# Patient Record
Sex: Male | Born: 1995
Health system: Southern US, Community
[De-identification: ages and names within clinical notes are randomized; demographics above are authoritative.]

## PROBLEM LIST (undated history)

## (undated) DIAGNOSIS — R45851 Suicidal ideations: Secondary | ICD-10-CM

## (undated) DIAGNOSIS — F29 Unspecified psychosis not due to a substance or known physiological condition: Secondary | ICD-10-CM

## (undated) DIAGNOSIS — R569 Unspecified convulsions: Secondary | ICD-10-CM

## (undated) DIAGNOSIS — F909 Attention-deficit hyperactivity disorder, unspecified type: Secondary | ICD-10-CM

## (undated) DIAGNOSIS — F259 Schizoaffective disorder, unspecified: Secondary | ICD-10-CM

## (undated) DIAGNOSIS — F988 Other specified behavioral and emotional disorders with onset usually occurring in childhood and adolescence: Secondary | ICD-10-CM

## (undated) DIAGNOSIS — F1910 Other psychoactive substance abuse, uncomplicated: Secondary | ICD-10-CM

## (undated) DIAGNOSIS — R519 Headache, unspecified: Secondary | ICD-10-CM

## (undated) DIAGNOSIS — F411 Generalized anxiety disorder: Secondary | ICD-10-CM

## (undated) DIAGNOSIS — C711 Malignant neoplasm of frontal lobe: Secondary | ICD-10-CM

---
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
## 2014-05-07 ENCOUNTER — Emergency Department (HOSPITAL_COMMUNITY)
Admission: EM | Admit: 2014-05-07 | Discharge: 2014-05-07 | Disposition: A | Discharge status: Home / Self Care | Payer: Medicaid Other | Attending: Emergency Medicine | Admitting: Emergency Medicine

## 2014-05-07 ENCOUNTER — Encounter (HOSPITAL_COMMUNITY): Payer: Self-pay | Admitting: Emergency Medicine

## 2014-05-07 DIAGNOSIS — R05 Cough: Secondary | ICD-10-CM | POA: Insufficient documentation

## 2014-05-07 DIAGNOSIS — N508 Other specified disorders of male genital organs: Secondary | ICD-10-CM | POA: Insufficient documentation

## 2014-05-07 DIAGNOSIS — M545 Low back pain, unspecified: Secondary | ICD-10-CM

## 2014-05-07 DIAGNOSIS — M62830 Muscle spasm of back: Secondary | ICD-10-CM

## 2014-05-07 DIAGNOSIS — Z720 Tobacco use: Secondary | ICD-10-CM | POA: Insufficient documentation

## 2014-05-07 LAB — URINALYSIS, ROUTINE W REFLEX MICROSCOPIC
Bilirubin Urine: NEGATIVE
Glucose, UA: NEGATIVE mg/dL
Hgb urine dipstick: NEGATIVE
Ketones, ur: NEGATIVE mg/dL
Leukocytes, UA: NEGATIVE
Nitrite: NEGATIVE
Protein, ur: NEGATIVE mg/dL
Specific Gravity, Urine: 1.018 (ref 1.005–1.030)
Urobilinogen, UA: 1 mg/dL (ref 0.0–1.0)
pH: 6.5 (ref 5.0–8.0)

## 2014-05-07 MED ORDER — HYDROCODONE-ACETAMINOPHEN 5-325 MG PO TABS: 1.0000 | ORAL_TABLET | Freq: Four times a day (QID) | ORAL | Status: DC | PRN

## 2014-05-07 MED ORDER — KETOROLAC TROMETHAMINE 30 MG/ML IJ SOLN
30.0000 mg | Freq: Once | INTRAMUSCULAR | Status: AC
  Administered 2014-05-07: 30 mg via INTRAMUSCULAR
  Filled 2014-05-07: qty 1

## 2014-05-07 MED ORDER — NAPROXEN 500 MG PO TABS: 500.0000 mg | ORAL_TABLET | Freq: Two times a day (BID) | ORAL | Status: DC | PRN

## 2014-05-07 MED ORDER — CYCLOBENZAPRINE HCL 10 MG PO TABS: 10.0000 mg | ORAL_TABLET | Freq: Three times a day (TID) | ORAL | Status: DC | PRN

## 2014-05-07 NOTE — ED Provider Notes (Signed)
CSN: [id number]     Arrival date & time 05/07/14  1057 History   First MD Initiated Contact with Patient 05/07/14 1237     Chief Complaint  Patient presents with  . Back Pain     (Consider location/radiation/quality/duration/timing/severity/associated sxs/prior Treatment) HPI Comments: Stephen Beard is a 19 y.o. healthy male, who presents to the ED with complaints of low back pain 3 days. Patient reports that he does some heavy lifting at work, but cannot  Recall any specific heavy lifting or twisting, no trauma preceding back pain. He reports that he has had back pain similar to this in the past, and it seems to come and go every once in a while. He reports 7/10 sharp right-sided lumbar pain which is constant and nonradiating, worse with bending and lifting, and unrelieved with ibuprofen and aspirin. He is also had a cough with clear sputum production 2 days which seems to be improving. Reports that he has some right testicular pain occasionally with his cough. Denies any fevers, chills, chest pain, shortness of breath, wheezing, abdominal pain, nausea, vomiting, diarrhea, consultation, rectal pain, penile discharge, testicular swelling, dysuria, hematuria, flank pain, numbness, tingling, weakness, cauda equina symptoms, IV drug use, or history of cancer. Patient reports that he has not been sexually active in the last 3 months.  Patient is a 19 y.o. male presenting with back pain. The history is provided by the patient. No language interpreter was used.  Back Pain Location:  Lumbar spine Quality: sharp. Radiates to:  Does not radiate Pain severity:  Moderate Pain is:  Same all the time Onset quality:  Gradual Duration:  3 days Timing:  Constant Progression:  Unchanged Chronicity:  Recurrent Context: lifting heavy objects (at work)   Relieved by:  Nothing Worsened by:  Bending and twisting (and heavy lifting) Ineffective treatments:  Ibuprofen and NSAIDs Associated symptoms: no  abdominal pain, no bladder incontinence, no bowel incontinence, no chest pain, no dysuria, no fever, no headaches, no leg pain, no numbness, no paresthesias, no perianal numbness, no tingling and no weakness   Risk factors: no hx of cancer     History reviewed. No pertinent past medical history. History reviewed. No pertinent past surgical history. No family history on file. History  Substance Use Topics  . Smoking status: Current Every Day Smoker  . Smokeless tobacco: Not on file  . Alcohol Use: Yes    Review of Systems  Constitutional: Negative for fever and chills.  Respiratory: Positive for cough (clear sputum production). Negative for shortness of breath and wheezing.   Cardiovascular: Negative for chest pain.  Gastrointestinal: Negative for nausea, vomiting, abdominal pain, diarrhea, constipation, blood in stool, rectal pain and bowel incontinence.  Genitourinary: Positive for testicular pain. Negative for bladder incontinence, dysuria, frequency, hematuria, flank pain, discharge, penile swelling, scrotal swelling and penile pain.  Musculoskeletal: Positive for back pain. Negative for myalgias, arthralgias, neck pain and neck stiffness.  Skin: Negative for rash.  Allergic/Immunologic: Negative for immunocompromised state.  Neurological: Negative for tingling, weakness, numbness, headaches and paresthesias.  Psychiatric/Behavioral: Negative for confusion.   10 Systems reviewed and are negative for acute change except as noted in the HPI.    Allergies  Review of patient's allergies indicates no known allergies.  Home Medications   Prior to Admission medications   Not on File   BP 133/88 mmHg  Pulse 90  Temp(Src) 98.8 F (37.1 C) (Oral)  Resp 16  SpO2 100% Physical Exam  Constitutional: He is oriented  to person, place, and time. Vital signs are normal. He appears well-developed and well-nourished.  Non-toxic appearance. No distress.  Afebrile, nontoxic, NAD  HENT:    Head: Normocephalic and atraumatic.  Mouth/Throat: Oropharynx is clear and moist and mucous membranes are normal.  Eyes: Conjunctivae and EOM are normal. Right eye exhibits no discharge. Left eye exhibits no discharge.  Neck: Normal range of motion. Neck supple.  Cardiovascular: Normal rate, regular rhythm, normal heart sounds and intact distal pulses.  Exam reveals no gallop and no friction rub.   No murmur heard. Pulmonary/Chest: Effort normal and breath sounds normal. No respiratory distress. He has no decreased breath sounds. He has no wheezes. He has no rhonchi. He has no rales.  CTAB in all lung fields, no w/r/r, no hypoxia or increased WOB, speaking in full sentences, SpO2 100% on RA   Abdominal: Soft. Normal appearance and bowel sounds are normal. He exhibits no distension. There is no tenderness. There is no rigidity, no rebound, no guarding, no CVA tenderness, no tenderness at McBurney's point and negative Murphy's sign. Hernia confirmed negative in the right inguinal area and confirmed negative in the left inguinal area.  Genitourinary: Testes normal. Cremasteric reflex is present. Right testis shows no mass, no swelling and no tenderness. Left testis shows no mass, no swelling and no tenderness. Circumcised. No phimosis, paraphimosis, hypospadias, penile erythema or penile tenderness. No discharge found.  Chaperone present for exam Circumcised penis without phimosis/paraphimosis, hypospadias, erythema, tenderness, or discharge. Testes with no masses or tenderness, no swelling, and cremasterics reflex present bilaterally. No inguinal hernias or adenopathy present.   Musculoskeletal: Normal range of motion.       Lumbar back: He exhibits tenderness and spasm. He exhibits normal range of motion, no bony tenderness, no swelling and no deformity.       Back:  Lumbar spine with FROM intact without spinous process TTP, no bony stepoffs or deformities, mild R sided paraspinous muscle TTP with  palpable muscle spasms. Strength 5/5 in all extremities, sensation grossly intact in all extremities, negative SLR bilaterally, gait steady and nonanalgic. No overlying skin changes.   Neurological: He is alert and oriented to person, place, and time. He has normal strength. No sensory deficit.  Skin: Skin is warm, dry and intact. No rash noted.  Psychiatric: He has a normal mood and affect.  Nursing note and vitals reviewed.   ED Course  Procedures (including critical care time) Labs Review Labs Reviewed - No data to display  Imaging Review No results found.   EKG Interpretation None      MDM   Final diagnoses:  Right-sided low back pain without sciatica  Muscle spasm of back    19 y.o. male with back pain, doesn't recall specific injury but has had back pain in the past. Does some heavy lifting. Reports some testicular pain but no tenderness or abnormalities found on exam. Doubt need for ultrasound but will obtain U/A and genprobe to evaluate for urethritis vs signs of nephrolithiasis. No red flag s/s of low back pain. No s/s of central cord compression or cauda equina. Lower extremities are neurovascularly intact and patient is ambulating without difficulty. Doubt need for imaging. Will give toradol and reassess shortly.   2:05 PM Pain improved after toradol. U/A clear. Will not empirically treat for GC/CT today but discussed that the lab will call if labs come back abnormal. Patient was counseled on back pain precautions and told to do activity as tolerated but do not  lift, push, or pull heavy objects more than 10 pounds for the next week. Patient counseled to use ice or heat on back for no longer than 15 minutes every hour. Rx given for muscle relaxer and counseled on proper use of muscle relaxant medication. Rx given for narcotic pain medicine and counseled on proper use of narcotic pain medications. Told that they can increase to every 4 hrs if needed while pain is worse.  Counseled not to combine this medication with others containing tylenol. Urged patient not to drink alcohol, drive, or perform any other activities that requires focus while taking either of these medications. Case manager came to discuss with pt options for uninsured PCP f/up, will refer to [facility] wellness. I explained the diagnosis and have given explicit precautions to return to the ER including for any other new or worsening symptoms. The patient understands and accepts the medical plan as it's been dictated and I have answered their questions. Discharge instructions concerning home care and prescriptions have been given. The patient is STABLE and is discharged to home in good condition.  BP 133/88 mmHg  Pulse 90  Temp(Src) 98.8 F (37.1 C) (Oral)  Resp 16  SpO2 100%  Meds ordered this encounter  Medications  . ketorolac (TORADOL) 30 MG/ML injection 30 mg    Sig:   . naproxen (NAPROSYN) 500 MG tablet    Sig: Take 1 tablet (500 mg total) by mouth 2 (two) times daily as needed for mild pain, moderate pain or headache (TAKE WITH MEALS.).    Dispense:  20 tablet    Refill:  0    Order Specific Question:  Supervising Provider    Answer:  Noemi Chapel D [[id number]]  . HYDROcodone-acetaminophen (NORCO) 5-325 MG per tablet    Sig: Take 1-2 tablets by mouth every 6 (six) hours as needed for severe pain.    Dispense:  6 tablet    Refill:  0    Order Specific Question:  Supervising Provider    Answer:  Noemi Chapel D [[id number]]  . cyclobenzaprine (FLEXERIL) 10 MG tablet    Sig: Take 1 tablet (10 mg total) by mouth 3 (three) times daily as needed for muscle spasms.    Dispense:  15 tablet    Refill:  0    Order Specific Question:  Supervising Provider    Answer:  Johnna Acosta [address] [place], PA-C 05/07/14 1408  Orpah Greek, MD 05/08/14 [phone]

## 2014-05-07 NOTE — Discharge Instructions (Signed)
Back Pain:  Your back pain should be treated with medicines such as ibuprofen or aleve and this back pain should get better over the next 2 weeks.  However if you develop severe or worsening pain, low back pain with fever, numbness, weakness or inability to walk or urinate, you should return to the ER immediately.  Please follow up with your doctor this week for a recheck if still having symptoms. Avoid heavy lifting over 10 pounds or any twisting activities.  Low back pain is discomfort in the lower back that may be due to injuries to muscles and ligaments around the spine.  Occasionally, it may be caused by a a problem to a part of the spine called a disc.  The pain may last several days or a week;  However, most patients get completely well in 4 weeks.  Self - care:  The application of heat can help soothe the pain.  Maintaining your daily activities, including walking, is encourged, as it will help you get better faster than just staying in bed. Perform gentle stretching as discussed. Drink plenty of fluids.  Medications are also useful to help with pain control.  A commonly prescribed medication includes norco.  Do not drive or operate heavy machinery while taking this medication.  Non steroidal anti inflammatory medications including Ibuprofen and naproxen;  These medications help both pain and swelling and are very useful in treating back pain.  They should be taken with food, as they can cause stomach upset, and more seriously, stomach bleeding.    Muscle relaxants:  These medications can help with muscle tightness that is a cause of lower back pain.  Most of these medications can cause drowsiness, and it is not safe to drive or use dangerous machinery while taking them.  SEEK IMMEDIATE MEDICAL ATTENTION IF: New numbness, tingling, weakness, or problem with the use of your arms or legs.  Severe back pain not relieved with medications.  Difficulty with or loss of control of your bowel or  bladder control.  Increasing pain in any areas of the body (such as chest or abdominal pain).  Shortness of breath, dizziness or fainting.  Nausea (feeling sick to your stomach), vomiting, fever, or sweats.  You will need to follow up with [facility] and wellness in 1-2 weeks for reassessment.   Back Pain, Adult Back pain is very common. The pain often gets better over time. The cause of back pain is usually not dangerous. Most people can learn to manage their back pain on their own.  HOME CARE   Stay active. Start with short walks on flat ground if you can. Try to walk farther each day.  Do not sit, drive, or stand in one place for more than 30 minutes. Do not stay in bed.  Do not avoid exercise or work. Activity can help your back heal faster.  Be careful when you bend or lift an object. Bend at your knees, keep the object close to you, and do not twist.  Sleep on a firm mattress. Lie on your side, and bend your knees. If you lie on your back, put a pillow under your knees.  Only take medicines as told by your doctor.  Put ice on the injured area.  Put ice in a plastic bag.  Place a towel between your skin and the bag.  Leave the ice on for 15-20 minutes, 03-04 times a day for the first 2 to 3 days. After that, you can switch  between ice and heat packs.  Ask your doctor about back exercises or massage.  Avoid feeling anxious or stressed. Find good ways to deal with stress, such as exercise. GET HELP RIGHT AWAY IF:   Your pain does not go away with rest or medicine.  Your pain does not go away in 1 week.  You have new problems.  You do not feel well.  The pain spreads into your legs.  You cannot control when you poop (bowel movement) or pee (urinate).  Your arms or legs feel weak or lose feeling (numbness).  You feel sick to your stomach (nauseous) or throw up (vomit).  You have belly (abdominal) pain.  You feel like you may pass out (faint). MAKE SURE YOU:     Understand these instructions.  Will watch your condition.  Will get help right away if you are not doing well or get worse. Document Released: 08/17/2007 Document Revised: 05/23/2011 Document Reviewed: 07/02/2013 [facility] Patient Information 2015 [place], [place]. This information is not intended to replace advice given to you by your health care provider. Make sure you discuss any questions you have with your health care provider.  Back Exercises Back exercises help treat and prevent back injuries. The goal is to increase your strength in your belly (abdominal) and back muscles. These exercises can also help with flexibility. Start these exercises when told by your doctor. HOME CARE Back exercises include: Pelvic Tilt.  Lie on your back with your knees bent. Tilt your pelvis until the lower part of your back is against the floor. Hold this position 5 to 10 sec. Repeat this exercise 5 to 10 times. Knee to Chest.  Pull 1 knee up against your chest and hold for 20 to 30 seconds. Repeat this with the other knee. This may be done with the other leg straight or bent, whichever feels better. Then, pull both knees up against your chest. Sit-Ups or Curl-Ups.  Bend your knees 90 degrees. Start with tilting your pelvis, and do a partial, slow sit-up. Only lift your upper half 30 to 45 degrees off the floor. Take at least 2 to 3 seonds for each sit-up. Do not do sit-ups with your knees out straight. If partial sit-ups are difficult, simply do the above but with only tightening your belly (abdominal) muscles and holding it as told. Hip-Lift.  Lie on your back with your knees flexed 90 degrees. Push down with your feet and shoulders as you raise your hips 2 inches off the floor. Hold for 10 seconds, repeat 5 to 10 times. Back Arches.  Lie on your stomach. Prop yourself up on bent elbows. Slowly press on your hands, causing an arch in your low back. Repeat 3 to 5 times. Shoulder-Lifts.  Lie face  down with arms beside your body. Keep hips and belly pressed to floor as you slowly lift your head and shoulders off the floor. Do not overdo your exercises. Be careful in the beginning. Exercises may cause you some mild back discomfort. If the pain lasts for more than 15 minutes, stop the exercises until you see your doctor. Improvement with exercise for back problems is slow.  Document Released: 04/02/2010 Document Revised: 05/23/2011 Document Reviewed: 12/30/2010 [facility] Patient Information 2015 [place], [place]. This information is not intended to replace advice given to you by your health care provider. Make sure you discuss any questions you have with your health care provider.  Heat Therapy Heat therapy can help make painful, stiff muscles and joints  feel better. Do not use heat on new injuries. Wait at least 48 hours after an injury to use heat. Do not use heat when you have aches or pains right after an activity. If you still have pain 3 hours after stopping the activity, then you may use heat. HOME CARE Wet heat pack  Soak a clean towel in warm water. Squeeze out the extra water.  Put the warm, wet towel in a plastic bag.  Place a thin, dry towel between your skin and the bag.  Put the heat pack on the area for 5 minutes, and check your skin. Your skin may be pink, but it should not be red.  Leave the heat pack on the area for 15 to 30 minutes.  Repeat this every 2 to 4 hours while awake. Do not use heat while you are sleeping. Warm water bath  Fill a tub with warm water.  Place the affected body part in the tub.  Soak the area for 20 to 40 minutes.  Repeat as needed. Hot water bottle  Fill the water bottle half full with hot water.  Press out the extra air. Close the cap tightly.  Place a dry towel between your skin and the bottle.  Put the bottle on the area for 5 minutes, and check your skin. Your skin may be pink, but it should not be red.  Leave the bottle on  the area for 15 to 30 minutes.  Repeat this every 2 to 4 hours while awake. Electric heating pad  Place a dry towel between your skin and the heating pad.  Set the heating pad on low heat.  Put the heating pad on the area for 10 minutes, and check your skin. Your skin may be pink, but it should not be red.  Leave the heating pad on the area for 20 to 40 minutes.  Repeat this every 2 to 4 hours while awake.  Do not lie on the heating pad.  Do not fall asleep while using the heating pad.  Do not use the heating pad near water. GET HELP RIGHT AWAY IF:  You get blisters or red skin.  Your skin is puffy (swollen), or you lose feeling (numbness) in the affected area.  You have any new problems.  Your problems are getting worse.  You have any questions or concerns. If you have any problems, stop using heat therapy until you see your doctor. MAKE SURE YOU:  Understand these instructions.  Will watch your condition.  Will get help right away if you are not doing well or get worse. Document Released: 05/23/2011 Document Reviewed: 04/23/2013 [facility] Patient Information 2015 [place]. This information is not intended to replace advice given to you by your health care provider. Make sure you discuss any questions you have with your health care provider.  Muscle Cramps and Spasms Muscle cramps and spasms occur when a muscle or muscles tighten and you have no control over this tightening (involuntary muscle contraction). They are a common problem and can develop in any muscle. The most common place is in the calf muscles of the leg. Both muscle cramps and muscle spasms are involuntary muscle contractions, but they also have differences:   Muscle cramps are sporadic and painful. They may last a few seconds to a quarter of an hour. Muscle cramps are often more forceful and last longer than muscle spasms.  Muscle spasms may or may not be painful. They may also last just a  few  seconds or much longer. CAUSES  It is uncommon for cramps or spasms to be due to a serious underlying problem. In many cases, the cause of cramps or spasms is unknown. Some common causes are:   Overexertion.   Overuse from repetitive motions (doing the same thing over and over).   Remaining in a certain position for a long period of time.   Improper preparation, form, or technique while performing a sport or activity.   Dehydration.   Injury.   Side effects of some medicines.   Abnormally low levels of the salts and ions in your blood (electrolytes), especially potassium and calcium. This could happen if you are taking water pills (diuretics) or you are pregnant.  Some underlying medical problems can make it more likely to develop cramps or spasms. These include, but are not limited to:   Diabetes.   Parkinson disease.   Hormone disorders, such as thyroid problems.   Alcohol abuse.   Diseases specific to muscles, joints, and bones.   Blood vessel disease where not enough blood is getting to the muscles.  HOME CARE INSTRUCTIONS   Stay well hydrated. Drink enough water and fluids to keep your urine clear or pale yellow.  It may be helpful to massage, stretch, and relax the affected muscle.  For tight or tense muscles, use a warm towel, heating pad, or hot shower water directed to the affected area.  If you are sore or have pain after a cramp or spasm, applying ice to the affected area may relieve discomfort.  Put ice in a plastic bag.  Place a towel between your skin and the bag.  Leave the ice on for 15-20 minutes, 03-04 times a day.  Medicines used to treat a known cause of cramps or spasms may help reduce their frequency or severity. Only take over-the-counter or prescription medicines as directed by your caregiver. SEEK MEDICAL CARE IF:  Your cramps or spasms get more severe, more frequent, or do not improve over time.  MAKE SURE YOU:   Understand  these instructions.  Will watch your condition.  Will get help right away if you are not doing well or get worse. Document Released: 08/20/2001 Document Revised: 06/25/2012 Document Reviewed: 02/15/2012 [facility] Patient Information 2015 [place], [place]. This information is not intended to replace advice given to you by your health care provider. Make sure you discuss any questions you have with your health care provider.

## 2014-05-07 NOTE — ED Notes (Signed)
Pt c/o LBP x 2 days. No known injury. States has had a cough x 2 days.

## 2014-05-07 NOTE — Discharge Planning (Signed)
J. , RN, BSN, NCM [phone] ED CM consulted to meet with patient concerning f/u care with PCP and patient does not have insurance. Pt presented to [facility] ED today with low back pain.  Met with patient at bedside, confirmed informaton. Pt  reports not having access to f/u care with PCP, or insurance coverage. Discussed with patient importance and benefits of  establishing PCP, and not utilizing the ED for primary care needs. Pt verbalized understanding and is in agreement. Discussed other options, provided list of local  affordable PCPs.  Pt voiced  [facility].  [facility] Brochure given with address, phone number, and the services highlighted. Explained that there is a [employer] Eligibility Counselor on site who will assist with [employer] information and process. Instructed to call or walk in Monday - Friday between the hours of 9- 5:30p to schedule appt for establishing care for PCP. Pt verbalized understanding.   

## 2014-05-08 LAB — GC/CHLAMYDIA PROBE AMP (~~LOC~~) NOT AT ARMC
Chlamydia: NEGATIVE
Neisseria Gonorrhea: NEGATIVE

## 2014-09-28 ENCOUNTER — Encounter (HOSPITAL_COMMUNITY): Payer: Self-pay | Admitting: Emergency Medicine

## 2014-09-28 ENCOUNTER — Emergency Department (HOSPITAL_COMMUNITY)
Admission: EM | Admit: 2014-09-28 | Discharge: 2014-09-28 | Disposition: A | Discharge status: Home / Self Care | Payer: Self-pay | Source: Home / Self Care | Attending: Family Medicine | Admitting: Family Medicine

## 2014-09-28 DIAGNOSIS — L559 Sunburn, unspecified: Secondary | ICD-10-CM

## 2014-09-28 MED ORDER — IBUPROFEN 800 MG PO TABS: 800.0000 mg | ORAL_TABLET | Freq: Three times a day (TID) | ORAL | Status: DC

## 2014-09-28 NOTE — Discharge Instructions (Signed)
Sunburn Sunburn is damage to the skin caused by overexposure to ultraviolet (UV) rays. People with light skin or a fair complexion may be more susceptible to sunburn. Repeated sun exposure causes early skin aging such as wrinkles and sun spots. It also increases the risk of skin cancer. CAUSES A sunburn is caused by getting too much UV radiation from the sun. SYMPTOMS  Red or pink skin.  Soreness and swelling.  Pain.  Blisters.  Peeling skin.  Headache, fever, and fatigue if sunburn covers a large area. TREATMENT  Your caregiver may tell you to take certain medicines to lessen inflammation.  Your caregiver may have you use hydrocortisone cream or spray to help with itching and inflammation.  Your caregiver may prescribe an antibiotic cream to use on blisters. HOME CARE INSTRUCTIONS   Avoid further exposure to the sun.  Cool baths and cool compresses may be helpful if used several times per day. Do not apply ice, since this may result in more damage to the skin.  Only take over-the-counter or prescription medicines for pain, discomfort, or fever as directed by your caregiver.  Use aloe or other over-the-counter sunburn creams or gels on your skin. Do not apply these creams or gels on blisters.  Drink enough fluids to keep your urine clear or pale yellow.  Do not break blisters. If blisters break, your caregiver may recommend an antibiotic cream to apply to the affected area. PREVENTION   Try to avoid the sun between 10:00 a.m. and 4:00 p.m. when it is the strongest.  Apply sunscreen at least 30 minutes before exposure to the sun.  Always wear protective hats, clothing, and sunglasses with UV protection.  Avoid medicines, herbs, and foods that increase your sensitivity to sunlight.  Avoid tanning beds. SEEK IMMEDIATE MEDICAL CARE IF:   You have a fever.  Your pain is uncontrolled with medicine.  You start to vomit or have diarrhea.  You feel faint or develop a  headache with confusion.  You develop severe blistering.  You have a pus-like (purulent) discharge coming from the blisters.  Your burn becomes more painful and swollen. MAKE SURE YOU:  Understand these instructions.  Will watch your condition.  Will get help right away if you are not doing well or get worse. Document Released: 12/08/2004 Document Revised: 06/25/2012 Document Reviewed: 08/22/2010 [facility] Patient Information 2015 [place], [place]. This information is not intended to replace advice given to you by your health care provider. Make sure you discuss any questions you have with your health care provider.   Use Ibuprofen every 8 hours when needed for pain, suggest continued use over the next few days. Cool compresses to legs. Calamine Lotion also works well. Feel better.
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
## 2014-09-28 NOTE — ED Notes (Signed)
Pt reports sunburn to upper and lower extremities onset 4 days ago Denies fevers, chills  Alert, no signs of acute distress.

## 2018-12-17 ENCOUNTER — Ambulatory Visit (HOSPITAL_COMMUNITY)
Admission: RE | Admit: 2018-12-17 | Discharge: 2018-12-17 | Disposition: A | Discharge status: Home / Self Care | Payer: Self-pay | Attending: Psychiatry | Admitting: Psychiatry

## 2018-12-17 DIAGNOSIS — F321 Major depressive disorder, single episode, moderate: Secondary | ICD-10-CM | POA: Insufficient documentation

## 2018-12-17 DIAGNOSIS — F15159 Other stimulant abuse with stimulant-induced psychotic disorder, unspecified: Secondary | ICD-10-CM | POA: Insufficient documentation

## 2018-12-17 DIAGNOSIS — Z720 Tobacco use: Secondary | ICD-10-CM | POA: Insufficient documentation

## 2018-12-17 NOTE — BH Assessment (Signed)
Assessment Note  Stephen Beard is an 23 y.o. male presenting voluntarily to [facility] for assessment. Patient is accompanied by his mother, [name], who waits in lobby and provides collateral information after assessment.  Patient reports feeling "down" since August. He endorses depressive symptoms of fatigue, social isolation, irritability, and insomnia. He denies SI/HI. Patient reports AH of a "faint noise" but is unable to tell if it is a hallucination or because he lives in a loud apartment complex. Patient denies VH, substance use, criminal charges, or history of psychiatric hospitalizations. Patient gave verbal consent for TTS to speak with his mother, [name].  Per patient's mother, [name]: Patient has been abusing methamphetamines. He initially was prescribed Adderral then began to abuse it. She states one night over the summer she found meth in his room. She states over the weekend patient and his boyfriend went to "visit a friend" in [employer], but she believes they were using as he has been behaving strangely, such as talking to himself and appearing paranoid.  Patient is alert and oriented x 4. He is dressed appropriately. Patient's speech is soft, eye contact is poor, and appears to be thought blocking. Patient's mood is anxious and his affect is congruent. Patient's insight, judgement, and impulse control are mildly impaired.   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  No past surgical history on file.  Family History: No family history on file.  Social History:  reports that he has been smoking. He does not have any smokeless tobacco history on file. He reports current alcohol use. He reports that he does not use drugs.  Additional Social History:  Alcohol / Drug Use Pain Medications: see MAR Prescriptions: see MAR Over the Counter: see MAR History  of alcohol / drug use?: Yes Substance #1 Name of Substance 1: methamphetamine 1 - Age of First Use: UTA 1 - Amount (size/oz): UTA 1 - Frequency: UTA 1 - Duration: UTA 1 - Last Use / Amount: UTA  CIWA: CIWA-Ar BP: 122/86 Pulse Rate: (!) 107 COWS:    Allergies: No Known Allergies  Home Medications: (Not in a hospital admission)   OB/GYN Status:  No LMP for male patient.  General Assessment Data Location of Assessment: [facility] TTS Assessment: In system Is this a Tele or Face-to-Face Assessment?: Face-to-Face Is this an Initial Assessment or a Re-assessment for this encounter?: Initial Assessment Patient Accompanied by:: Parent Language Other than English: No Living Arrangements: Other (Comment)(apartment) What gender do you identify as?: Male Marital status: Long term relationship Maiden name: Relph Pregnancy Status: No Living Arrangements: Spouse/significant other Can pt return to current living arrangement?: Yes Admission Status: Voluntary Is patient capable of signing voluntary admission?: Yes Referral Source: Self/Family/Friend Insurance type: none     Crisis Care Plan Living Arrangements: Spouse/significant other Legal Guardian: (self) Name of Psychiatrist: none Name of Therapist: none  Education Status Is patient currently in school?: No Is the patient employed, unemployed or receiving disability?: Employed  Risk to self with the past 6 months Suicidal Ideation: No Has patient been a risk to self within the past 6 months prior to admission? : No Suicidal Intent: No Has patient had any suicidal intent within the past 6 months prior to admission? : No Is patient at risk for suicide?: No Suicidal Plan?: No Has patient had any suicidal plan within the past 6 months prior to  admission? : No Access to Means: No What has been your use of drugs/alcohol within the last 12 months?: methamphetamine use Previous Attempts/Gestures: No How many  times?: 0 Other Self Harm Risks: none Triggers for Past Attempts: None known Intentional Self Injurious Behavior: None Family Suicide History: No Recent stressful life event(s): (none noted) Persecutory voices/beliefs?: No Depression: Yes Depression Symptoms: Feeling angry/irritable, Feeling worthless/self pity, Loss of interest in usual pleasures, Guilt, Fatigue, Isolating, Tearfulness, Insomnia, Despondent Substance abuse history and/or treatment for substance abuse?: No Suicide prevention information given to non-admitted patients: Not applicable  Risk to Others within the past 6 months Homicidal Ideation: No Does patient have any lifetime risk of violence toward others beyond the six months prior to admission? : No Thoughts of Harm to Others: No Current Homicidal Intent: No Current Homicidal Plan: No Access to Homicidal Means: No Identified Victim: none History of harm to others?: No Assessment of Violence: None Noted Violent Behavior Description: none noted Does patient have access to weapons?: No Criminal Charges Pending?: No Does patient have a court date: No Is patient on probation?: No  Psychosis Hallucinations: Auditory Delusions: None noted  Mental Status Report Appearance/Hygiene: Unremarkable Eye Contact: Good Motor Activity: Freedom of movement Speech: Soft, Slow Level of Consciousness: Alert Mood: Anxious Affect: Anxious Anxiety Level: Moderate Thought Processes: Thought Blocking Judgement: Impaired Orientation: Person, Time, Place, Situation Obsessive Compulsive Thoughts/Behaviors: None  Cognitive Functioning Concentration: Normal Memory: Recent Intact, Remote Intact Is patient IDD: No Insight: Fair Impulse Control: Fair Appetite: Good Have you had any weight changes? : No Change Sleep: Decreased Total Hours of Sleep: 0 Vegetative Symptoms: None  ADLScreening [facility] Assessment Services) Patient's cognitive ability adequate to safely complete  daily activities?: Yes Patient able to express need for assistance with ADLs?: Yes Independently performs ADLs?: Yes (appropriate for developmental age)  Prior Inpatient Therapy Prior Inpatient Therapy: No  Prior Outpatient Therapy Prior Outpatient Therapy: Yes Prior Therapy Dates: 2016 Prior Therapy Facilty/Provider(s): [facility] Reason for Treatment: ADHD Does patient have an ACCT team?: No Does patient have Intensive In-House Services?  : No Does patient have [facility] services? : No Does patient have P4CC services?: No  ADL Screening (condition at time of admission) Patient's cognitive ability adequate to safely complete daily activities?: Yes Is the patient deaf or have difficulty hearing?: No Does the patient have difficulty seeing, even when wearing glasses/contacts?: No Does the patient have difficulty concentrating, remembering, or making decisions?: No Patient able to express need for assistance with ADLs?: Yes Does the patient have difficulty dressing or bathing?: No Independently performs ADLs?: Yes (appropriate for developmental age) Does the patient have difficulty walking or climbing stairs?: No Weakness of Legs: None Weakness of Arms/Hands: None  Home Assistive Devices/Equipment Home Assistive Devices/Equipment: None  Therapy Consults (therapy consults require a physician order) PT Evaluation Needed: No OT Evalulation Needed: No SLP Evaluation Needed: No Abuse/Neglect Assessment (Assessment to be complete while patient is alone) Abuse/Neglect Assessment Can Be Completed: Yes Physical Abuse: Denies Verbal Abuse: Denies Sexual Abuse: Denies Exploitation of patient/patient's resources: Denies Self-Neglect: Denies Values / Beliefs Cultural Requests During Hospitalization: None Spiritual Requests During Hospitalization: None Consults Spiritual Care Consult Needed: No Social Work Consult Needed: No Regulatory affairs officer (For Healthcare) Does Patient  Have a Medical Advance Directive?: No Would patient like information on creating a medical advance directive?: No - Patient declined          Disposition: Shuvon Rankin, NP Disposition Initial Assessment Completed for this Encounter: Yes Disposition  of Patient: Discharge Patient refused recommended treatment: No Mode of transportation if patient is discharged/movement?: Car Patient referred to: Outpatient clinic referral  On Site Evaluation by:   Reviewed with Physician:    Orvis Brill 12/17/2018 6:38 PM

## 2018-12-17 NOTE — H&P (Signed)
Behavioral Health Medical Screening Exam  Stephen Beard is an 23 y.o. male presents as a walk-in. He was in the company of his mother who was concerned because he was hallucinating after meth use few days ago. Pt denies suicidal or homicidal ideation but endorses auditory hallucination that is not command in nature. Pt reports using THC and meth occasionally. He has no prior psychiatric history, inpatient or outpatient services.  During evaluation Stephen Beard is sitting up in chair; he is alert/oriented x 4; calm/cooperative; and mood congruent with affect.  Patient is speaking in a clear tone at moderate volume, and normal pace; with good eye contact.  Hi thought process is coherent and relevant; There is no indication that he is currently responding to internal/external stimuli or experiencing delusional thought content.  Patient denies suicidal/self-harm/homicidal ideation, psychosis, and paranoia.  Patient has remained calm throughout assessment and has answered questions appropriately.    Total Time spent with patient: 30 minutes  Psychiatric Specialty Exam: Physical Exam  Vitals reviewed. Constitutional: He is oriented to person, place, and time. He appears well-nourished.  HENT:  Head: Normocephalic.  Neck: Normal range of motion.  Respiratory: Effort normal.  Musculoskeletal: Normal range of motion.  Neurological: He is alert and oriented to person, place, and time.  Skin: Skin is warm and dry.  Psychiatric: He has a normal mood and affect. His speech is normal and behavior is normal. Judgment and thought content normal. Cognition and memory are normal.    Review of Systems  Psychiatric/Behavioral: Depression: Denies. Hallucinations:  Hearing noises after drug use. Lat use of meth 3 days ago. Substance abuse:  meth and THC. Suicidal ideas: Denies. Nervous/anxious: Denies. Insomnia: Denies.   All other systems reviewed and are negative.   Blood pressure 122/86, pulse (!)  107, temperature 98.6 F (37 C), temperature source Oral, resp. rate 18, SpO2 100 %.There is no height or weight on file to calculate BMI.  General Appearance: Casual and Well Groomed  Eye Contact:  Good  Speech:  Normal Rate  Volume:  Normal  Mood:  Euthymic  Affect:  Congruent  Thought Process:  Descriptions of Associations: Intact  Orientation:  Full (Time, Place, and Person)  Thought Content:  WDL  Suicidal Thoughts:  No  Homicidal Thoughts:  No  Memory:  Immediate;   Good Recent;   Good Remote;   Good  Judgement:  Intact  Insight:  Present  Psychomotor Activity:  Normal  Concentration: Concentration: Good and Attention Span: Good  Recall:  Good  Fund of Knowledge:Fair  Language: Good  Akathisia:  No  Handed:  Right  AIMS (if indicated):     Assets:  Communication Skills Desire for [place] Talents/Skills Transportation  Sleep:       Musculoskeletal: Strength & Muscle Tone: within normal limits Gait & Station: normal Patient leans: N/A  Blood pressure 122/86, pulse (!) 107, temperature 98.6 F (37 C), temperature source Oral, resp. rate 18, SpO2 100 %.  Recommendations:    Outpatient psychiatric recommendation. Resources were give.  Patient to follow up at [facility].     Based on my evaluation the patient does not appear to have an emergency medical condition.    Mliss Fritz, NP 12/17/2018, 6:39 PM

## 2018-12-18 ENCOUNTER — Emergency Department (HOSPITAL_COMMUNITY): Payer: Self-pay

## 2018-12-18 ENCOUNTER — Other Ambulatory Visit: Payer: Self-pay

## 2018-12-18 ENCOUNTER — Emergency Department (HOSPITAL_COMMUNITY)
Admission: EM | Admit: 2018-12-18 | Discharge: 2018-12-19 | Disposition: A | Discharge status: Home / Self Care | Payer: Self-pay | Attending: Emergency Medicine | Admitting: Emergency Medicine

## 2018-12-18 DIAGNOSIS — W25XXXA Contact with sharp glass, initial encounter: Secondary | ICD-10-CM | POA: Insufficient documentation

## 2018-12-18 DIAGNOSIS — S51812A Laceration without foreign body of left forearm, initial encounter: Secondary | ICD-10-CM | POA: Insufficient documentation

## 2018-12-18 DIAGNOSIS — Z79899 Other long term (current) drug therapy: Secondary | ICD-10-CM | POA: Insufficient documentation

## 2018-12-18 DIAGNOSIS — Z23 Encounter for immunization: Secondary | ICD-10-CM | POA: Insufficient documentation

## 2018-12-18 DIAGNOSIS — Y999 Unspecified external cause status: Secondary | ICD-10-CM | POA: Insufficient documentation

## 2018-12-18 DIAGNOSIS — Y9389 Activity, other specified: Secondary | ICD-10-CM | POA: Insufficient documentation

## 2018-12-18 DIAGNOSIS — Y929 Unspecified place or not applicable: Secondary | ICD-10-CM | POA: Insufficient documentation

## 2018-12-18 DIAGNOSIS — F17210 Nicotine dependence, cigarettes, uncomplicated: Secondary | ICD-10-CM | POA: Insufficient documentation
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
## 2018-12-19 MED ORDER — LIDOCAINE-EPINEPHRINE 1 %-1:100000 IJ SOLN
10.0000 mL | Freq: Once | INTRAMUSCULAR | Status: AC
  Administered 2018-12-19: 10 mL
  Filled 2018-12-19: qty 10

## 2018-12-19 MED ORDER — IBUPROFEN 800 MG PO TABS
800.0000 mg | ORAL_TABLET | Freq: Once | ORAL | Status: AC
  Administered 2018-12-19: 800 mg via ORAL
  Filled 2018-12-19: qty 1

## 2018-12-19 MED ORDER — TETANUS-DIPHTH-ACELL PERTUSSIS 5-2.5-18.5 LF-MCG/0.5 IM SUSP
0.5000 mL | Freq: Once | INTRAMUSCULAR | Status: AC
  Administered 2018-12-19: 0.5 mL via INTRAMUSCULAR
  Filled 2018-12-19: qty 0.5
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
## 2018-12-19 NOTE — Discharge Instructions (Signed)
Have your stitches removed in 10 to 12 days by your primary care doctor.  You may apply topical bacitracin or Neosporin to prevent infection.  Do not soak your wound in stagnant water such as while bathing or swimming until your stitches are removed.  You may take 600 mg ibuprofen every 6 hours for management of pain or soreness.  Return for any new or concerning symptoms or if signs of infection develop.

## 2019-02-08 ENCOUNTER — Other Ambulatory Visit: Payer: Self-pay

## 2019-02-08 ENCOUNTER — Emergency Department (HOSPITAL_COMMUNITY)
Admission: EM | Admit: 2019-02-08 | Discharge: 2019-02-08 | Disposition: A | Discharge status: Home / Self Care | Payer: Self-pay | Attending: Emergency Medicine | Admitting: Emergency Medicine

## 2019-02-08 ENCOUNTER — Emergency Department (HOSPITAL_COMMUNITY): Payer: Self-pay

## 2019-02-08 DIAGNOSIS — F17200 Nicotine dependence, unspecified, uncomplicated: Secondary | ICD-10-CM | POA: Insufficient documentation

## 2019-02-08 DIAGNOSIS — Z79899 Other long term (current) drug therapy: Secondary | ICD-10-CM | POA: Insufficient documentation

## 2019-02-08 DIAGNOSIS — R440 Auditory hallucinations: Secondary | ICD-10-CM | POA: Insufficient documentation

## 2019-02-08 LAB — COMPREHENSIVE METABOLIC PANEL
ALT: 18 U/L (ref 0–44)
AST: 23 U/L (ref 15–41)
Albumin: 4.5 g/dL (ref 3.5–5.0)
Alkaline Phosphatase: 65 U/L (ref 38–126)
Anion gap: 10 (ref 5–15)
BUN: 15 mg/dL (ref 6–20)
CO2: 25 mmol/L (ref 22–32)
Calcium: 9.9 mg/dL (ref 8.9–10.3)
Chloride: 104 mmol/L (ref 98–111)
Creatinine, Ser: 0.98 mg/dL (ref 0.61–1.24)
GFR calc Af Amer: >60 mL/min (ref >60)
GFR calc non Af Amer: >60 mL/min (ref >60)
Glucose, Bld: 106 mg/dL — ABNORMAL HIGH (ref 70–99)
Potassium: 3.9 mmol/L (ref 3.5–5.1)
Sodium: 139 mmol/L (ref 135–145)
Total Bilirubin: 0.9 mg/dL (ref 0.3–1.2)
Total Protein: 7.2 g/dL (ref 6.5–8.1)

## 2019-02-08 LAB — RAPID URINE DRUG SCREEN, HOSP PERFORMED
Amphetamines: POSITIVE — AB
Barbiturates: NOT DETECTED
Benzodiazepines: NOT DETECTED
Cocaine: NOT DETECTED
Opiates: NOT DETECTED
Tetrahydrocannabinol: NOT DETECTED

## 2019-02-08 LAB — CBC WITH DIFFERENTIAL/PLATELET
Abs Immature Granulocytes: 0.03 10*3/uL (ref 0.00–0.07)
Basophils Absolute: 0 10*3/uL (ref 0.0–0.1)
Basophils Relative: 1 %
Eosinophils Absolute: 0 10*3/uL (ref 0.0–0.5)
Eosinophils Relative: 0 %
HCT: 47.7 % (ref 39.0–52.0)
Hemoglobin: 16.4 g/dL (ref 13.0–17.0)
Immature Granulocytes: 0 %
Lymphocytes Relative: 24 %
Lymphs Abs: 2 10*3/uL (ref 0.7–4.0)
MCH: 32 pg (ref 26.0–34.0)
MCHC: 34.4 g/dL (ref 30.0–36.0)
MCV: 93 fL (ref 80.0–100.0)
Monocytes Absolute: 0.7 10*3/uL (ref 0.1–1.0)
Monocytes Relative: 9 %
Neutro Abs: 5.6 10*3/uL (ref 1.7–7.7)
Neutrophils Relative %: 66 %
Platelets: 214 10*3/uL (ref 150–400)
RBC: 5.13 MIL/uL (ref 4.22–5.81)
RDW: 12.3 % (ref 11.5–15.5)
WBC: 8.4 10*3/uL (ref 4.0–10.5)
nRBC: 0 % (ref 0.0–0.2)

## 2019-02-08 LAB — ETHANOL: Alcohol, Ethyl (B): <10 mg/dL (ref <10)

## 2019-02-08 IMAGING — DX DG HUMERUS 2V *R*
2 series · 2 of 2 positions shown · non-contrast
Comparison: None.

CLINICAL DATA: Assess for foreign body. Patient reports hearing
voices he believes are the result of a chip placement in his right
upper arm.

EXAM:
RIGHT HUMERUS - 2+ VIEW

[humerus ap]
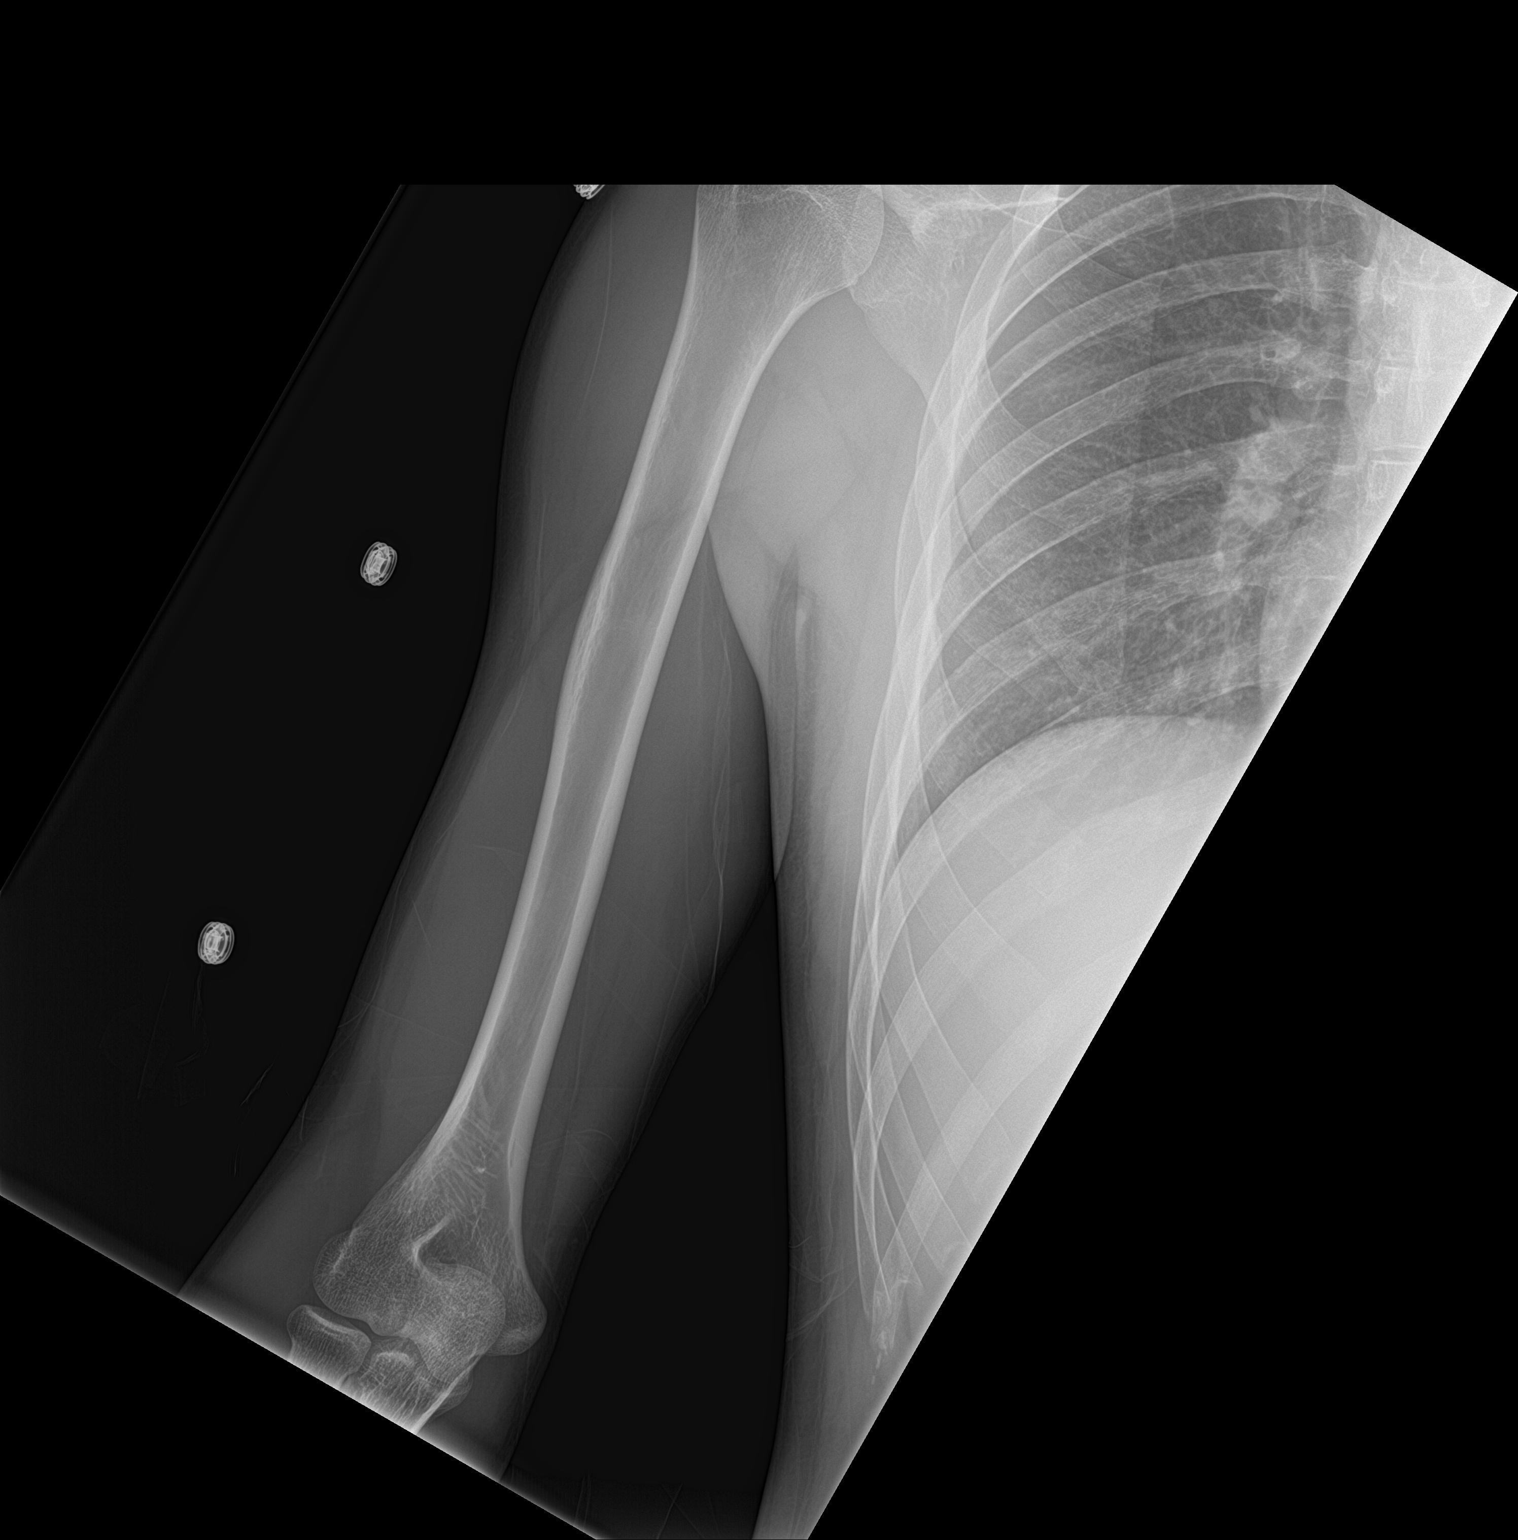

[humerus lat]
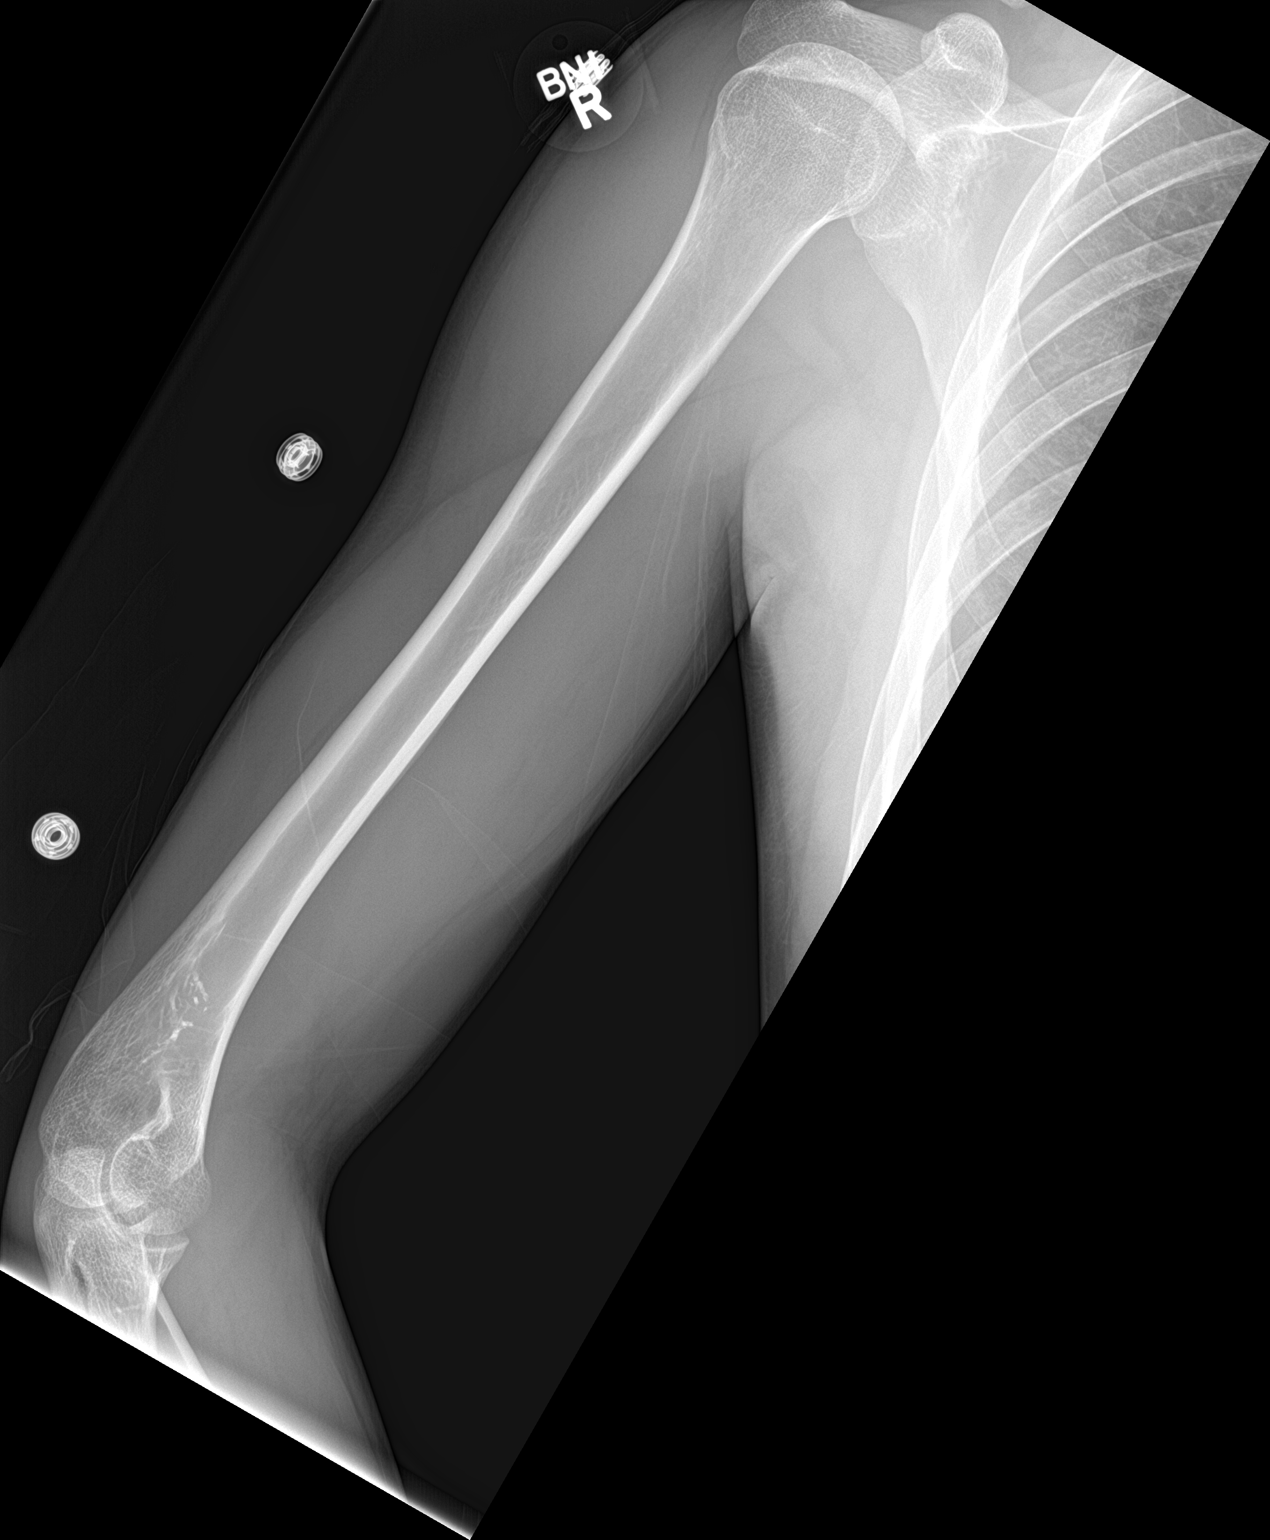

[2 of 2 positions shown; findings below may reference images not displayed]

FINDINGS: Cortical margins of the humerus are intact. There is no evidence of
fracture or other focal bone lesions. Soft tissues are unremarkable.
No radiopaque foreign body. No soft tissue air.
IMPRESSION: Normal radiographs of the right upper extremity. No radiopaque
foreign body.

## 2019-02-08 NOTE — Discharge Instructions (Signed)
Please call and follow up closely with your primary care provider for further management of your auditory hallucination.  Return if you have any concerns.

## 2019-02-08 NOTE — ED Provider Notes (Signed)
[facility] EMERGENCY DEPARTMENT Provider Note   CSN: [id number] Arrival date & time: 02/08/19  1149     History   Chief Complaint Chief Complaint  Patient presents with  . Hallucinations    HPI Stephen Beard is a 23 y.o. male.     The history is provided by the patient and medical records. No language interpreter was used.     23 year old male with history of general anxiety disorder, ADHD, depression presenting complaining of hearing voices.  Patient report for the past 4 to 5 months he has noticed hearing voices that talks to him.  It happened on almost a daily basis.  Voice are not harmful nature without any command hallucination.  He denies any visual hallucination.  He tries to drown out the voice by listening to music but he has noticed progressions of symptoms.  Patient believes he may have been implanted with a chip to his right arm from a identity protection program.  He denies any SI or HI.  He report alcohol use socially only.  He denies any drug use.  Admits to taking ADHD medication and has been compliant with that.  No new medication changes.  He denies any strong family history of psychiatric illness.  He has been sleeping a bit more than usual is that his diet has been the same.  No past medical history on file.  There are no active problems to display for this patient.   No past surgical history on file.      Home Medications    Prior to Admission medications   Medication Sig Start Date End Date Taking? Authorizing Provider  cyclobenzaprine (FLEXERIL) 10 MG tablet Take 1 tablet (10 mg total) by mouth 3 (three) times daily as needed for muscle spasms. 05/07/14   Street, [place], PA-C  HYDROcodone-acetaminophen (NORCO) 5-325 MG per tablet Take 1-2 tablets by mouth every 6 (six) hours as needed for severe pain. 05/07/14   Street, [place], PA-C  ibuprofen (ADVIL,MOTRIN) 800 MG tablet Take 1 tablet (800 mg total) by mouth 3 (three) times  daily. 09/28/14   Bjorn Pippin, PA-C  naproxen (NAPROSYN) 500 MG tablet Take 1 tablet (500 mg total) by mouth 2 (two) times daily as needed for mild pain, moderate pain or headache (TAKE WITH MEALS.). 05/07/14   Street, [place], [place]    Family History No family history on file.  Social History Social History   Tobacco Use  . Smoking status: Current Every Day Smoker  Substance Use Topics  . Alcohol use: Yes  . Drug use: No     Allergies   Patient has no known allergies.   Review of Systems Review of Systems  All other systems reviewed and are negative.    Physical Exam Updated Vital Signs BP 127/83 (BP Location: Right Arm)   Pulse 74   Temp 98.4 F (36.9 C) (Oral)   Resp 18   SpO2 100%   Physical Exam Vitals signs and nursing note reviewed.  Constitutional:      General: He is not in acute distress.    Appearance: He is well-developed.  HENT:     Head: Atraumatic.  Eyes:     Conjunctiva/sclera: Conjunctivae normal.  Neck:     Musculoskeletal: Neck supple.  Cardiovascular:     Rate and Rhythm: Normal rate and regular rhythm.     Pulses: Normal pulses.     Heart sounds: Normal heart sounds.  Pulmonary:     Effort:  Pulmonary effort is normal.     Breath sounds: Normal breath sounds. No wheezing, rhonchi or rales.  Abdominal:     Palpations: Abdomen is soft.     Tenderness: There is no abdominal tenderness.  Skin:    Findings: No rash.     Comments: Right arm with normal appearance no evidence of foreign body noted.  Neurological:     Mental Status: He is alert and oriented to person, place, and time.     GCS: GCS eye subscore is 4. GCS verbal subscore is 5. GCS motor subscore is 6.  Psychiatric:        Attention and Perception: Attention normal.        Mood and Affect: Mood normal.        Speech: Speech normal.        Behavior: Behavior is cooperative.        Thought Content: Thought content does not include homicidal or suicidal ideation.       ED Treatments / Results  Labs (all labs ordered are listed, but only abnormal results are displayed) Labs Reviewed  COMPREHENSIVE METABOLIC PANEL - Abnormal; Notable for the following components:      Result Value   Glucose, Bld 106 (*)    All other components within normal limits  RAPID URINE DRUG SCREEN, HOSP PERFORMED - Abnormal; Notable for the following components:   Amphetamines POSITIVE (*)    All other components within normal limits  ETHANOL  CBC WITH DIFFERENTIAL/PLATELET    EKG None  Radiology Dg Humerus Right  Result Date: 02/08/2019 CLINICAL DATA:  Assess for foreign body. Patient reports hearing voices he believes are the result of a chip placement in his right upper arm. EXAM: RIGHT HUMERUS - 2+ VIEW COMPARISON:  None. FINDINGS: Cortical margins of the humerus are intact. There is no evidence of fracture or other focal bone lesions. Soft tissues are unremarkable. No radiopaque foreign body. No soft tissue air. IMPRESSION: Normal radiographs of the right upper extremity. No radiopaque foreign body. Electronically Signed   By: Keith Rake M.D.   On: 02/08/2019 13:34    Procedures Procedures (including critical care time)  Medications Ordered in ED Medications - No data to display   Initial Impression / Assessment and Plan / ED Course  I have reviewed the triage vital signs and the nursing notes.  Pertinent labs & imaging results that were available during my care of the patient were reviewed by me and considered in my medical decision making (see chart for details).        BP 127/83 (BP Location: Right Arm)   Pulse 74   Temp 98.4 F (36.9 C) (Oral)   Resp 18   SpO2 100%    Final Clinical Impressions(s) / ED Diagnoses   Final diagnoses:  Auditory hallucination    ED Discharge Orders    None     12:16 PM Patient here due to auditory hallucination for the past for 5 months concerning for psychosis due to underlying schizophrenia and less  likely to be drug-induced.  Work up initiated. Will perform medical screening exam.  2:42 PM Patient is medically cleared.  He is currently psychiatrically stable and can follow-up with his PCP outpatient for further evaluation and management of his auditory hallucination.  A screening x-ray of his right upper arm without any acute finding.  No evidence of foreign body noted.  Return precaution discussed.  Evidence of amphetamine in UDS, pt does take Adderal.  Domenic Moras, PA-C 02/08/19 1444    Carmin Muskrat, MD 02/09/19 [phone]

## 2019-02-08 NOTE — ED Notes (Signed)
Pt aware of need for urine sample, urinal at bedside 

## 2019-02-08 NOTE — ED Notes (Signed)
Patient verbalizes understanding of discharge instructions. Opportunity for questioning and answers were provided. Armband removed by staff, pt discharged from ED.  

## 2019-02-08 NOTE — ED Triage Notes (Signed)
Pt presents with c/o hearing voices that he believes are a result of a chip placement in his right upper arm. Pt states he believes a company placed the chip so that they could communicate with him. Pt states he would like to have an xray of his arm to verify placement of this chip, and possible have it removed. Pt endorses use of meth, but "not in the last few days". Pt states he does not believe that meth is the cause of this voices he is hearing. Pt denies SI/HI.

## 2019-02-08 NOTE — ED Notes (Signed)
Patient transported to X-ray 

## 2019-03-23 DIAGNOSIS — F330 Major depressive disorder, recurrent, mild: Secondary | ICD-10-CM | POA: Insufficient documentation

## 2019-03-23 DIAGNOSIS — F902 Attention-deficit hyperactivity disorder, combined type: Secondary | ICD-10-CM | POA: Insufficient documentation

## 2019-06-30 ENCOUNTER — Emergency Department (HOSPITAL_COMMUNITY): Payer: Medicaid Other

## 2019-06-30 ENCOUNTER — Encounter (HOSPITAL_COMMUNITY): Payer: Self-pay | Admitting: Emergency Medicine

## 2019-06-30 ENCOUNTER — Inpatient Hospital Stay (HOSPITAL_COMMUNITY)
Admission: EM | Admit: 2019-06-30 | Discharge: 2019-07-12 | DRG: 025 | Disposition: A | Discharge status: Home / Self Care | Payer: Medicaid Other | Attending: Neurological Surgery | Admitting: Neurological Surgery

## 2019-06-30 ENCOUNTER — Other Ambulatory Visit: Payer: Self-pay

## 2019-06-30 DIAGNOSIS — F17200 Nicotine dependence, unspecified, uncomplicated: Secondary | ICD-10-CM | POA: Diagnosis present

## 2019-06-30 DIAGNOSIS — Z20822 Contact with and (suspected) exposure to covid-19: Secondary | ICD-10-CM | POA: Diagnosis present

## 2019-06-30 DIAGNOSIS — G936 Cerebral edema: Secondary | ICD-10-CM | POA: Diagnosis present

## 2019-06-30 DIAGNOSIS — Z791 Long term (current) use of non-steroidal anti-inflammatories (NSAID): Secondary | ICD-10-CM | POA: Diagnosis not present

## 2019-06-30 DIAGNOSIS — G9389 Other specified disorders of brain: Secondary | ICD-10-CM

## 2019-06-30 DIAGNOSIS — H547 Unspecified visual loss: Secondary | ICD-10-CM | POA: Diagnosis present

## 2019-06-30 DIAGNOSIS — Z79891 Long term (current) use of opiate analgesic: Secondary | ICD-10-CM | POA: Diagnosis not present

## 2019-06-30 DIAGNOSIS — G935 Compression of brain: Secondary | ICD-10-CM | POA: Diagnosis not present

## 2019-06-30 DIAGNOSIS — F22 Delusional disorders: Secondary | ICD-10-CM | POA: Diagnosis not present

## 2019-06-30 DIAGNOSIS — F909 Attention-deficit hyperactivity disorder, unspecified type: Secondary | ICD-10-CM | POA: Diagnosis present

## 2019-06-30 DIAGNOSIS — J9600 Acute respiratory failure, unspecified whether with hypoxia or hypercapnia: Secondary | ICD-10-CM | POA: Diagnosis not present

## 2019-06-30 DIAGNOSIS — Z01818 Encounter for other preprocedural examination: Secondary | ICD-10-CM

## 2019-06-30 DIAGNOSIS — J95821 Acute postprocedural respiratory failure: Secondary | ICD-10-CM | POA: Diagnosis not present

## 2019-06-30 DIAGNOSIS — D649 Anemia, unspecified: Secondary | ICD-10-CM | POA: Diagnosis not present

## 2019-06-30 DIAGNOSIS — R0602 Shortness of breath: Secondary | ICD-10-CM | POA: Diagnosis not present

## 2019-06-30 DIAGNOSIS — R569 Unspecified convulsions: Secondary | ICD-10-CM | POA: Diagnosis not present

## 2019-06-30 DIAGNOSIS — J189 Pneumonia, unspecified organism: Secondary | ICD-10-CM | POA: Diagnosis not present

## 2019-06-30 DIAGNOSIS — D496 Neoplasm of unspecified behavior of brain: Secondary | ICD-10-CM | POA: Diagnosis present

## 2019-06-30 DIAGNOSIS — C718 Malignant neoplasm of overlapping sites of brain: Secondary | ICD-10-CM | POA: Diagnosis not present

## 2019-06-30 DIAGNOSIS — I619 Nontraumatic intracerebral hemorrhage, unspecified: Secondary | ICD-10-CM | POA: Diagnosis not present

## 2019-06-30 DIAGNOSIS — C714 Malignant neoplasm of occipital lobe: Secondary | ICD-10-CM | POA: Diagnosis present

## 2019-06-30 DIAGNOSIS — C719 Malignant neoplasm of brain, unspecified: Secondary | ICD-10-CM | POA: Diagnosis present

## 2019-06-30 DIAGNOSIS — J9601 Acute respiratory failure with hypoxia: Secondary | ICD-10-CM

## 2019-06-30 DIAGNOSIS — R0902 Hypoxemia: Secondary | ICD-10-CM | POA: Diagnosis not present

## 2019-06-30 DIAGNOSIS — Z79899 Other long term (current) drug therapy: Secondary | ICD-10-CM | POA: Diagnosis not present

## 2019-06-30 HISTORY — DX: Attention-deficit hyperactivity disorder, unspecified type: F90.9

## 2019-06-30 LAB — BASIC METABOLIC PANEL
Anion gap: 12 (ref 5–15)
BUN: 11 mg/dL (ref 6–20)
CO2: 22 mmol/L (ref 22–32)
Calcium: 9.3 mg/dL (ref 8.9–10.3)
Chloride: 104 mmol/L (ref 98–111)
Creatinine, Ser: 1.07 mg/dL (ref 0.61–1.24)
GFR calc Af Amer: >60 mL/min (ref >60)
GFR calc non Af Amer: >60 mL/min (ref >60)
Glucose, Bld: 102 mg/dL — ABNORMAL HIGH (ref 70–99)
Potassium: 4 mmol/L (ref 3.5–5.1)
Sodium: 138 mmol/L (ref 135–145)

## 2019-06-30 LAB — CBC WITH DIFFERENTIAL/PLATELET
Abs Immature Granulocytes: 0.02 10*3/uL (ref 0.00–0.07)
Basophils Absolute: 0 10*3/uL (ref 0.0–0.1)
Basophils Relative: 0 %
Eosinophils Absolute: 0.1 10*3/uL (ref 0.0–0.5)
Eosinophils Relative: 1 %
HCT: 46.2 % (ref 39.0–52.0)
Hemoglobin: 16.4 g/dL (ref 13.0–17.0)
Immature Granulocytes: 0 %
Lymphocytes Relative: 33 %
Lymphs Abs: 2.8 10*3/uL (ref 0.7–4.0)
MCH: 32.3 pg (ref 26.0–34.0)
MCHC: 35.5 g/dL (ref 30.0–36.0)
MCV: 91.1 fL (ref 80.0–100.0)
Monocytes Absolute: 0.7 10*3/uL (ref 0.1–1.0)
Monocytes Relative: 9 %
Neutro Abs: 4.8 10*3/uL (ref 1.7–7.7)
Neutrophils Relative %: 57 %
Platelets: 228 10*3/uL (ref 150–400)
RBC: 5.07 MIL/uL (ref 4.22–5.81)
RDW: 12.3 % (ref 11.5–15.5)
WBC: 8.4 10*3/uL (ref 4.0–10.5)
nRBC: 0 % (ref 0.0–0.2)

## 2019-06-30 IMAGING — MR MR HEAD WO/W CM
7 of 14 series · 24 of 48 positions shown · IV contrast (gadavist)
Comparison: Head CT [DATE]

CLINICAL DATA: Worsening headache for 2 months.  Encephalopathy.

EXAM:
MRI HEAD WITHOUT AND WITH CONTRAST
TECHNIQUE: Multiplanar, multiecho pulse sequences of the brain and surrounding
structures were obtained without and with intravenous contrast.
CONTRAST:  6mL GADAVIST GADOBUTROL 1 MMOL/ML IV SOLN

[Series 2: DWI · axial · 3.0mm · 0.94mm/px · z∈[-113,+28]mm · 6 of 100 slices shown (1 of 2)]
[im 1/100]
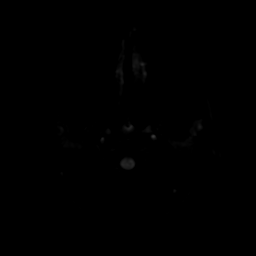
[im 20/100]
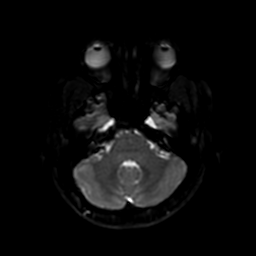
[im 40/100]
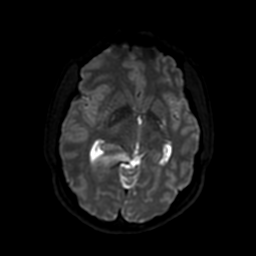
[im 60/100]
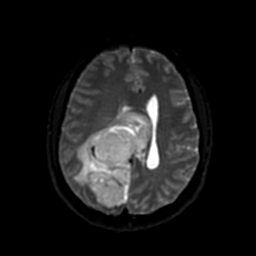
[im 80/100]
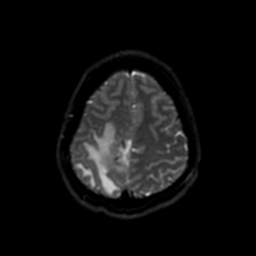
[im 100/100]
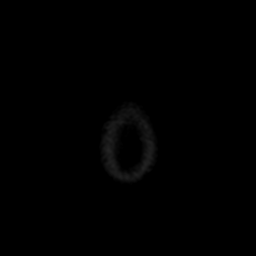

[Series 3: DWI · coronal · 4.0mm · 0.94mm/px · 5 of 74 slices shown (2 of 2)]
[im 1/74]
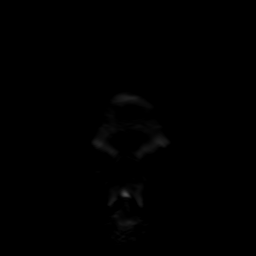
[im 19/74]
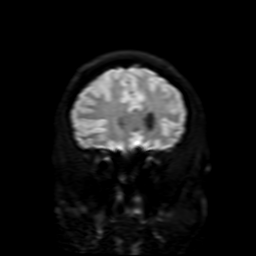
[im 37/74]
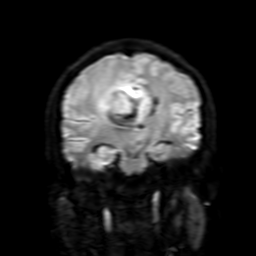
[im 55/74]
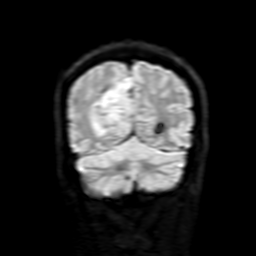
[im 74/74]
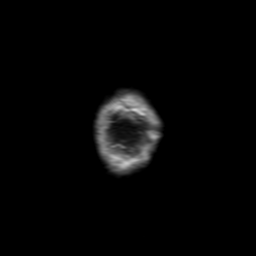

[Series 4: FLAIR · sagittal · 5.0mm · 0.23mm/px · 2 of 25 slices shown (1 of 2)]
[im 1/25]
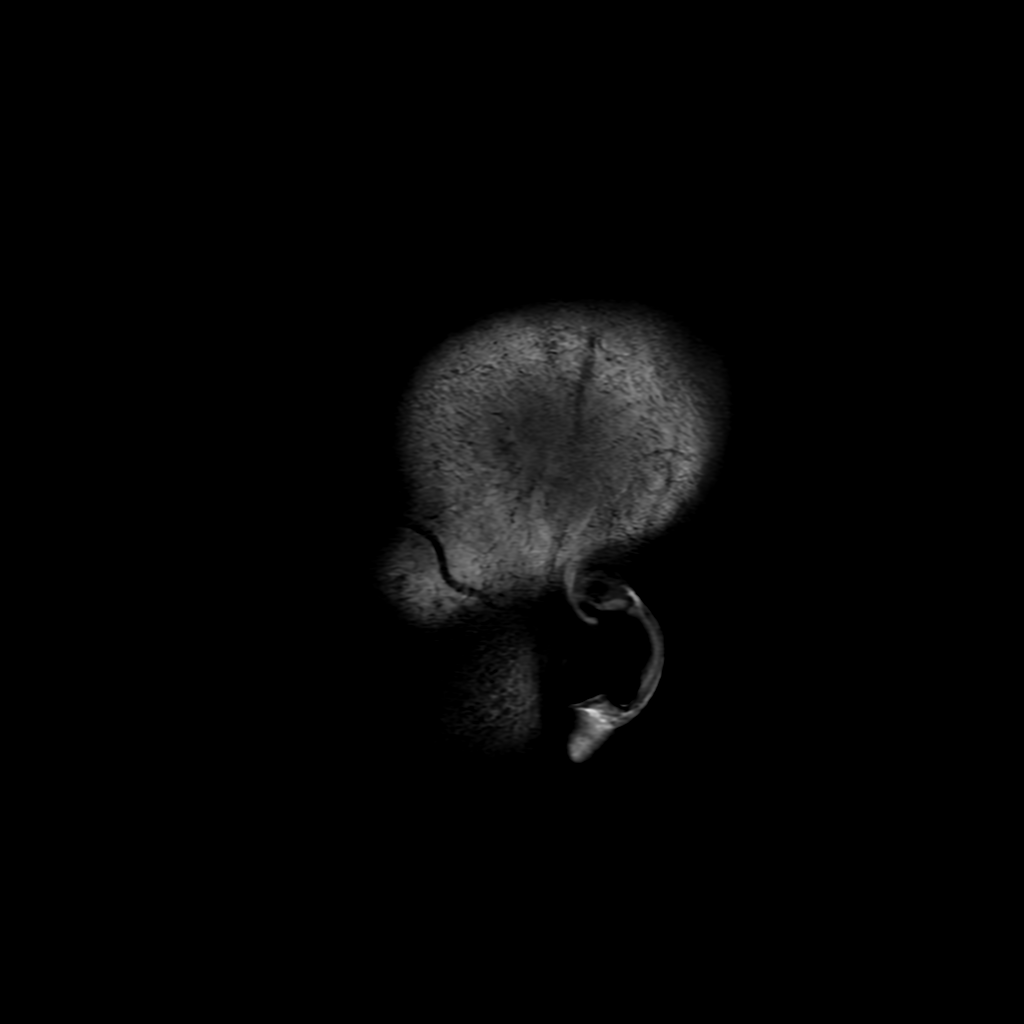
[im 25/25]
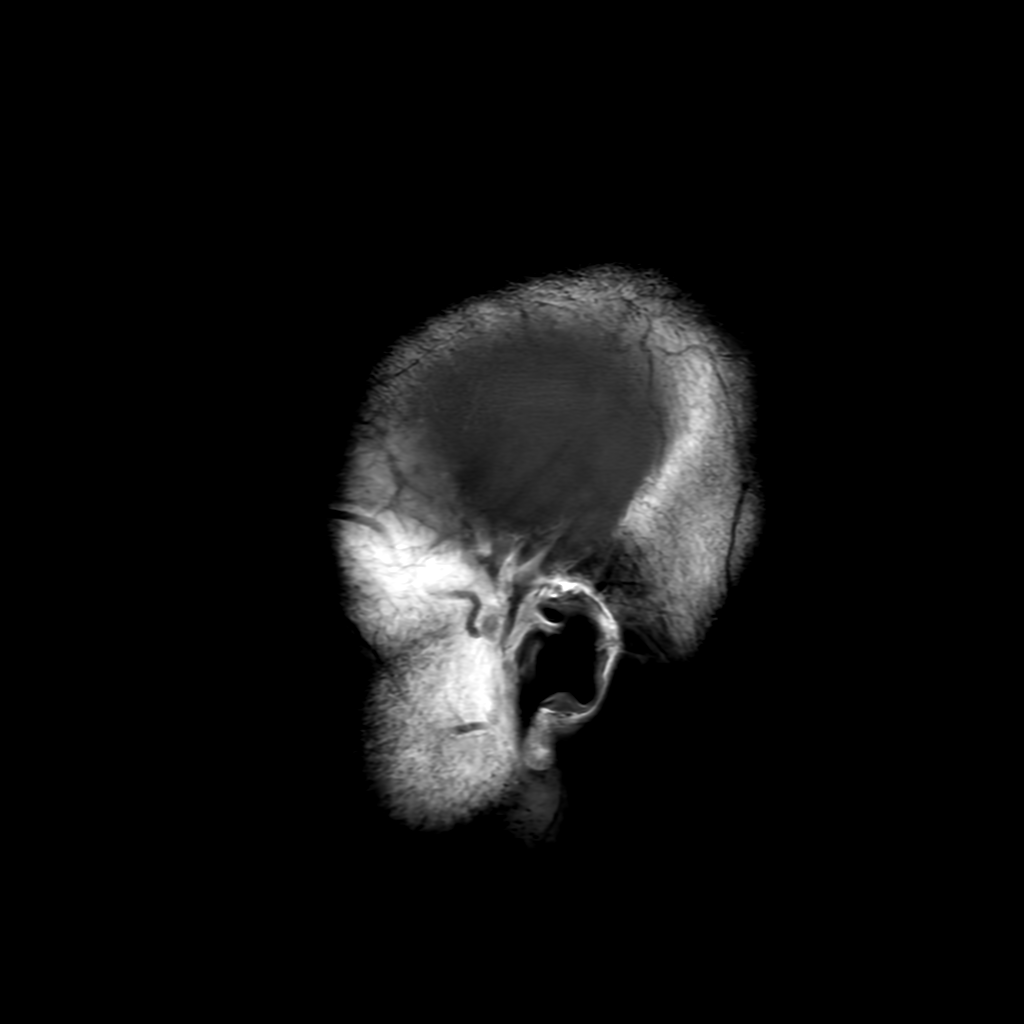

[Series 6: FLAIR · axial · 3.0mm · 0.41mm/px · z∈[-128,+9]mm · 2 of 26 slices shown (2 of 2)]
[im 1/26]
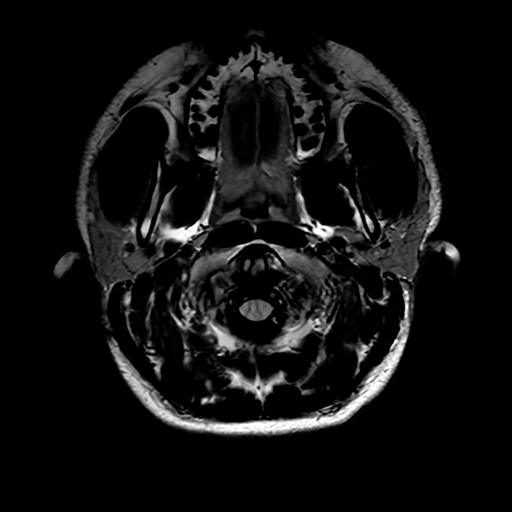
[im 26/26]
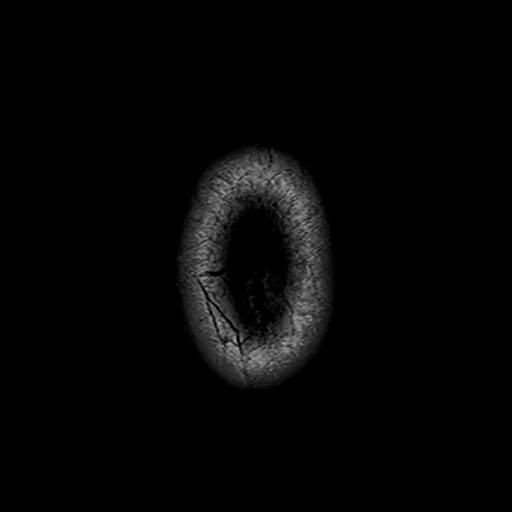

[Series 13: FLAIR post-contrast · sagittal · 5.0mm · 0.47mm/px · 2 of 25 slices shown]
[im 1/25]
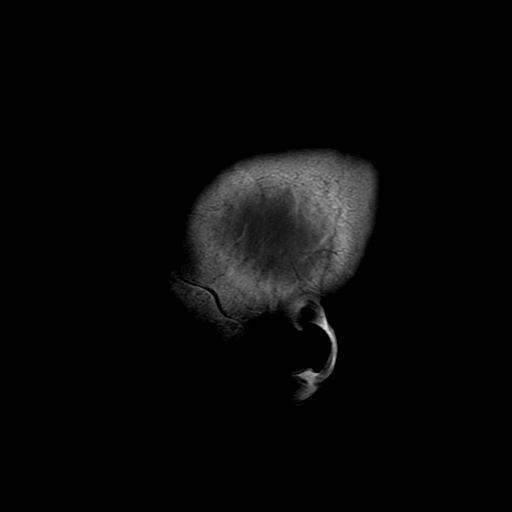
[im 25/25]
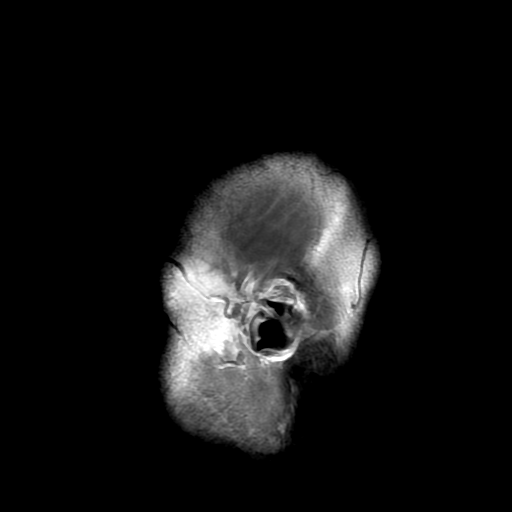

[Series 250: ADC · axial · 3.0mm · 0.94mm/px · z∈[-113,+28]mm · 4 of 50 slices shown (1 of 2)]
[im 1/50]
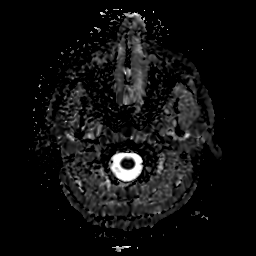
[im 17/50]
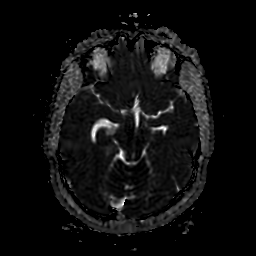
[im 33/50]
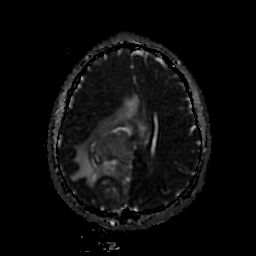
[im 50/50]
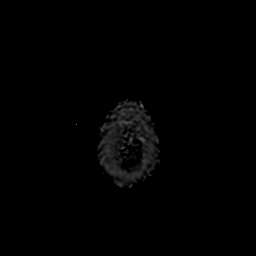

[Series 350: ADC · coronal · 4.0mm · 0.94mm/px · 3 of 35 slices shown (2 of 2)]
[im 1/35]
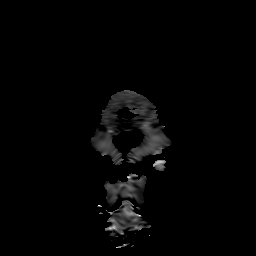
[im 18/35]
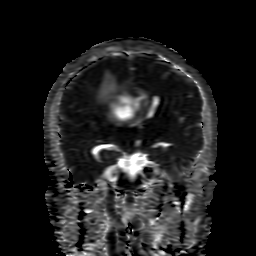
[im 35/35]
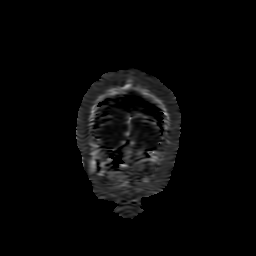

[24 of 48 positions shown; findings below may reference images not displayed]

FINDINGS: BRAIN: There is a large, heterogeneous mass of the posterior right
hemisphere, centered in the right occipital lobe. There is marked
mass effect on the right lateral ventricle. Leftward midline shift
measures 10 mm. Other enhancing components measure up to 3.1 cm. No
contralateral or infratentorial lesion. There is multifocal
calcification within the mass.

VASCULAR: Major flow voids are preserved. Susceptibility-sensitive
sequences show no chronic microhemorrhage or superficial siderosis.

SKULL AND UPPER CERVICAL SPINE: Normal calvarium and skull base.
Visualized upper cervical spine and soft tissues are normal.

SINUSES/ORBITS: No paranasal sinus fluid levels or advanced mucosal
thickening. No mastoid or middle ear effusion. Normal orbits.
IMPRESSION: Clustered enhancing masses of the right occipital lobe causing 10 mm
of leftward midline shift with mass effect on the lateral
ventricles. Differential considerations include oligodendroglioma,
glioblastoma multiforme, extraventricular neurocytoma and metastatic
disease.

## 2019-06-30 IMAGING — CT CT HEAD W/O CM
3 series · 14 of 47 positions shown, 16 images · non-contrast
Comparison: None.

CLINICAL DATA: Progressive headache, blurred vision on the left.

EXAM:
CT HEAD WITHOUT CONTRAST
TECHNIQUE: Contiguous axial images were obtained from the base of the skull
through the vertex without intravenous contrast.

[Series 3: head 5.0 h30s · axial · 0.41mm/px · z∈[-115,+15]mm · 8 of 32 slices shown, 10 images]
[im 3/32  brain]
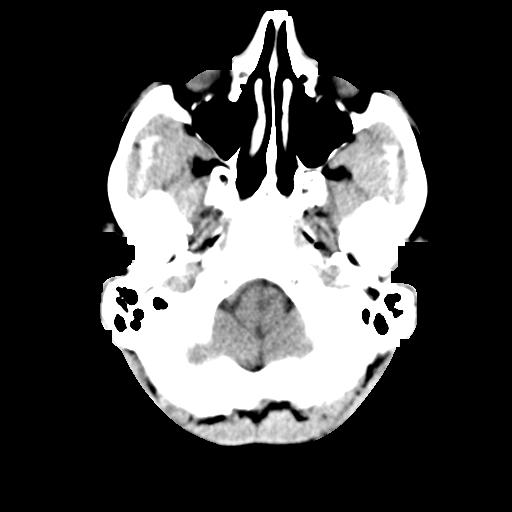
[im 3/32  bone]
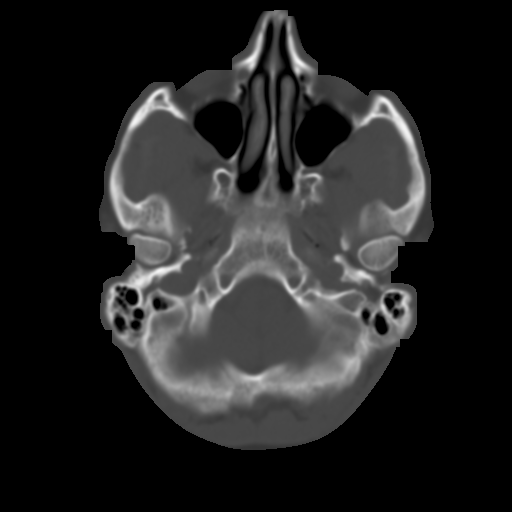
[im 7/32  brain]
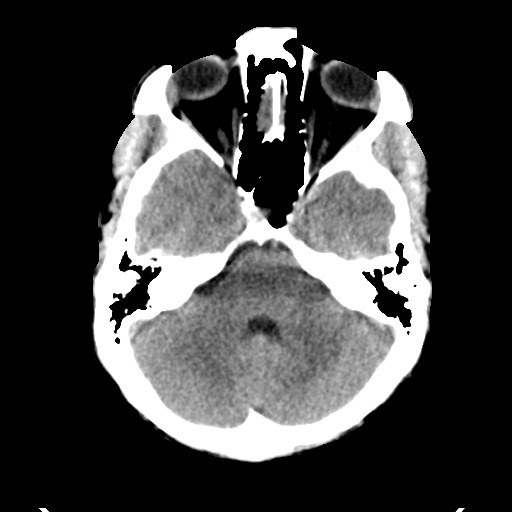
[im 10/32  brain]
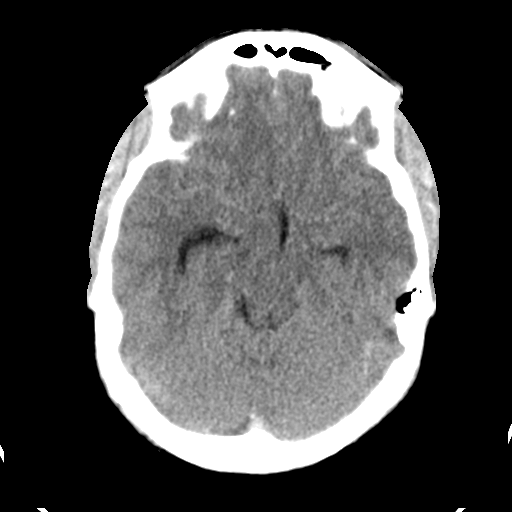
[im 14/32  brain]
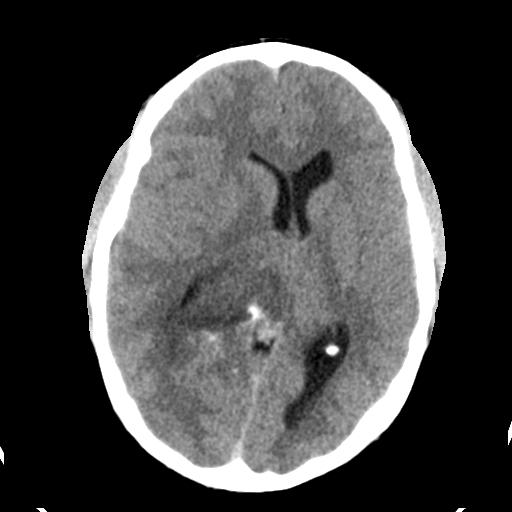
[im 18/32  brain]
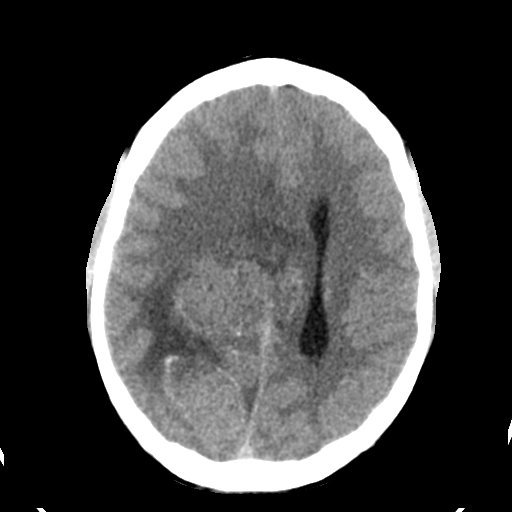
[im 18/32  bone]
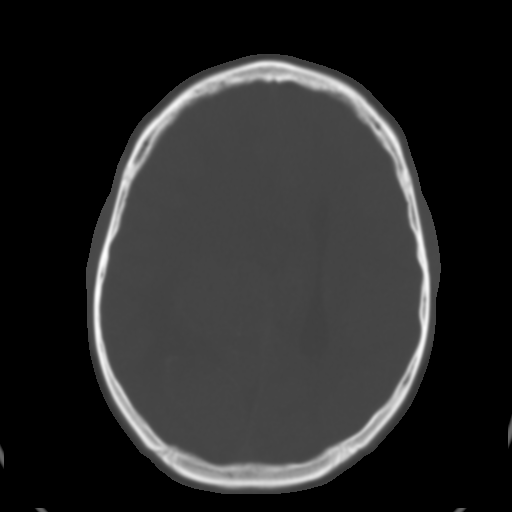
[im 22/32  brain]
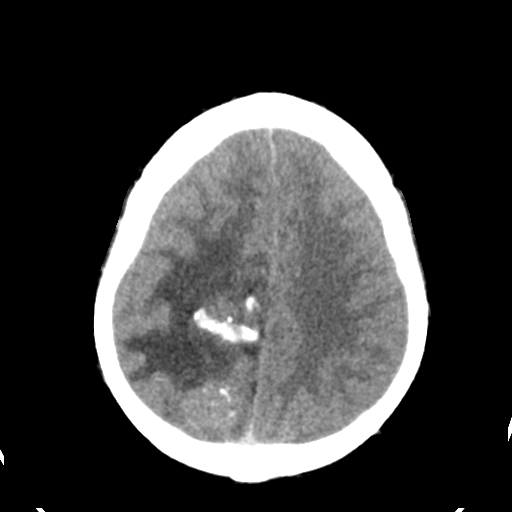
[im 25/32  brain]
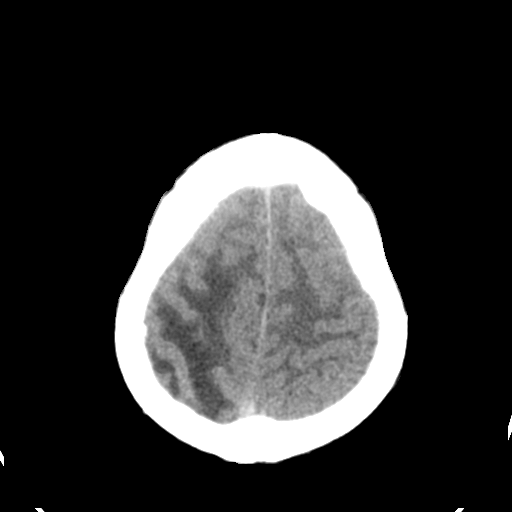
[im 29/32  brain]
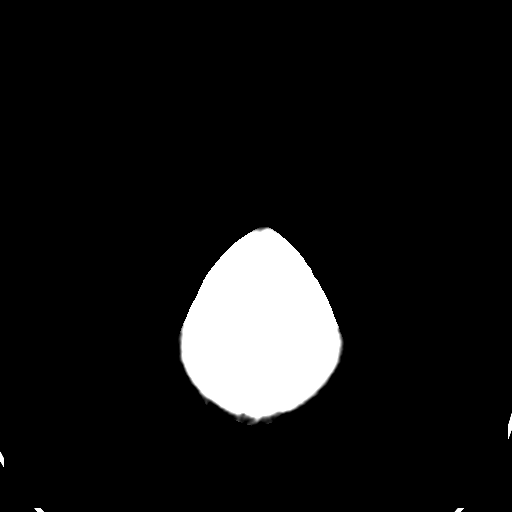

[Series 5: head 3.0 mpr cor · coronal · 0.30mm/px · 3 of 67 slices shown]
[im 23/67  brain]
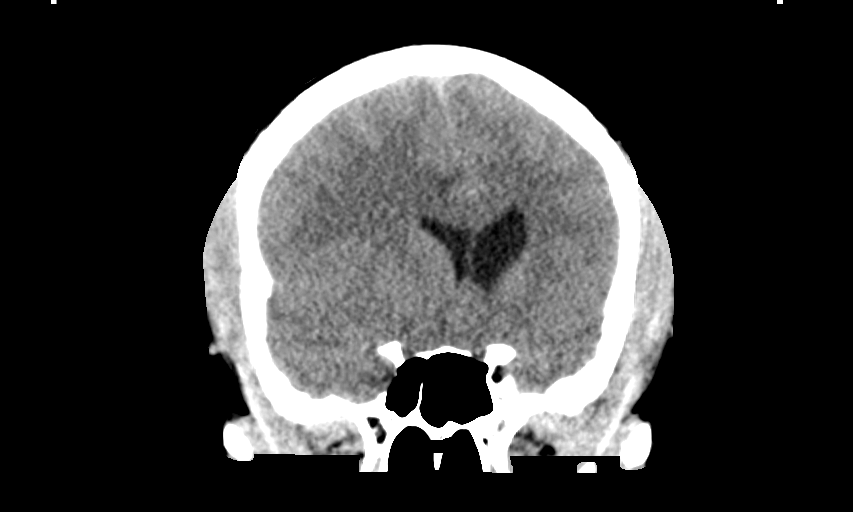
[im 30/67  brain]
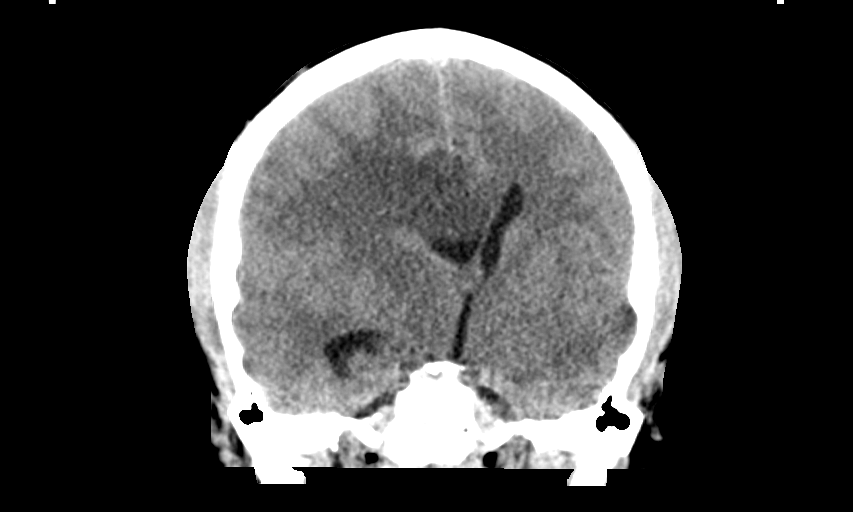
[im 37/67  brain]
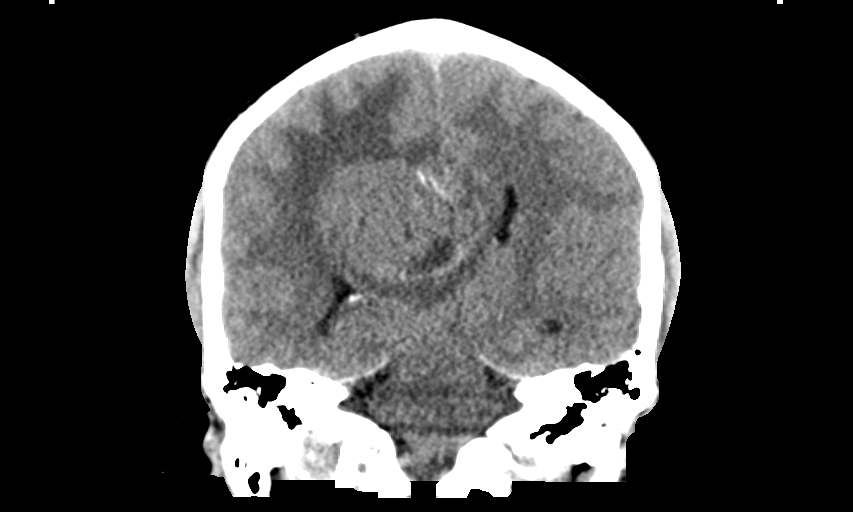

[Series 6: head 3.0 mpr sag · sagittal · 0.30mm/px · 3 of 67 slices shown]
[im 23/67  brain]
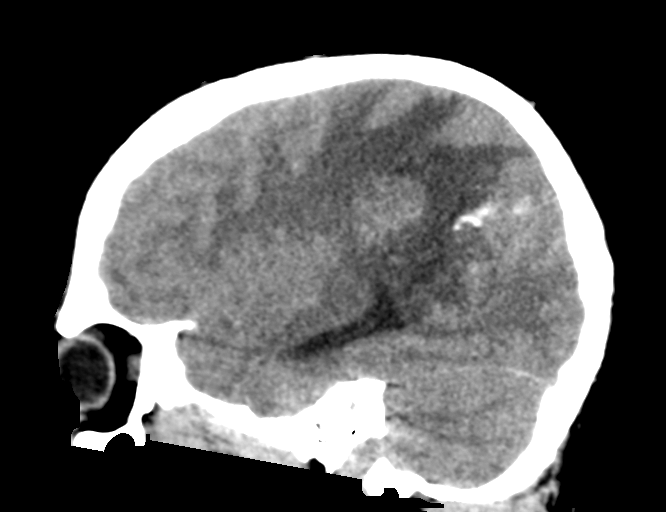
[im 34/67  brain]
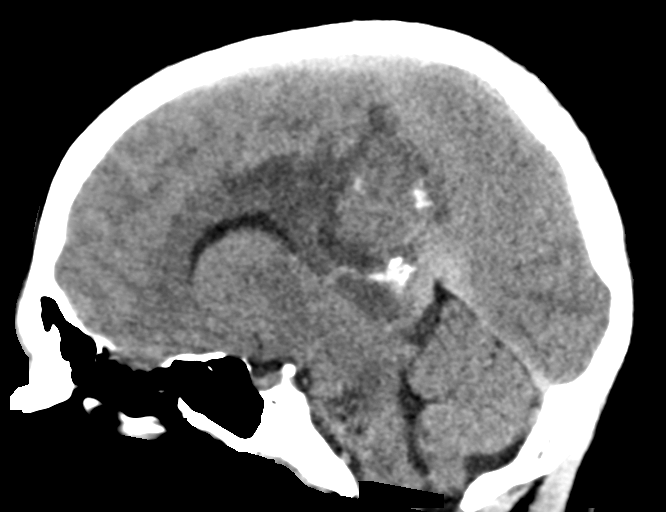
[im 45/67  brain]
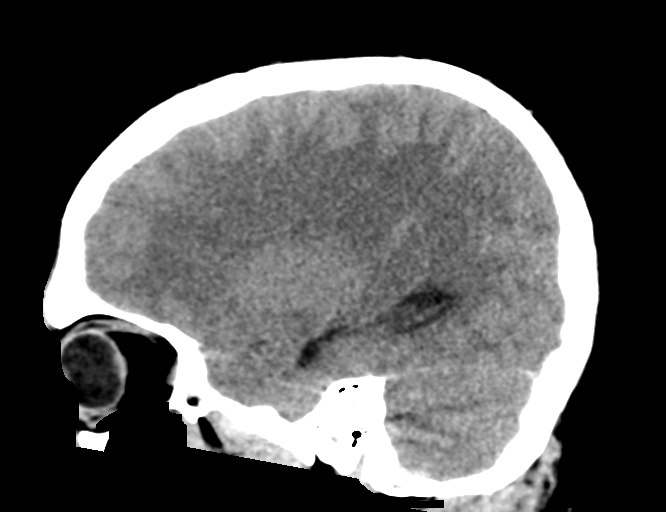

[14 of 47 positions shown; findings below may reference images not displayed]

FINDINGS: Brain: Large mass lesions noted in the right occipital lobe and
right medial parietal lobe. At least 2 mass lesions appear to be
present measuring up to 4.2 cm and 4.1 cm. There is extensive
surrounding vasogenic edema. Mass effect on the lateral ventricles.
21 mm of right to left midline shift. No hydrocephalus.

Vascular: No hyperdense vessel or unexpected calcification.

Skull: No acute calvarial abnormality.

Sinuses/Orbits: Visualized paranasal sinuses and mastoids clear.
Orbital soft tissues unremarkable.

Other: None
IMPRESSION: Large solid mass lesions which are partially calcified noted in the
right parietal and occipital lobes with 21 mm of right to left
midline shift. Recommend further evaluation with MRI.

## 2019-06-30 MED ORDER — ONDANSETRON HCL 4 MG PO TABS: 4.0000 mg | ORAL_TABLET | Freq: Four times a day (QID) | ORAL | Status: DC | PRN

## 2019-06-30 MED ORDER — HYDROCODONE-ACETAMINOPHEN 5-325 MG PO TABS
1.0000 | ORAL_TABLET | ORAL | Status: DC | PRN
  Administered 2019-07-01 (×2): 2 via ORAL
  Administered 2019-07-01: 1 via ORAL
  Administered 2019-07-01 – 2019-07-03 (×4): 2 via ORAL
  Filled 2019-06-30 (×5): qty 2
  Filled 2019-06-30: qty 1
  Filled 2019-06-30: qty 2

## 2019-06-30 MED ORDER — ONDANSETRON HCL 4 MG/2ML IJ SOLN
4.0000 mg | Freq: Four times a day (QID) | INTRAMUSCULAR | Status: DC | PRN
  Administered 2019-07-03 – 2019-07-04 (×3): 4 mg via INTRAVENOUS
  Filled 2019-06-30 (×3): qty 2

## 2019-06-30 MED ORDER — SODIUM CHLORIDE 0.9 % IV SOLN: INTRAVENOUS | Status: DC

## 2019-06-30 MED ORDER — GADOBUTROL 1 MMOL/ML IV SOLN
6.0000 mL | Freq: Once | INTRAVENOUS | Status: AC | PRN
  Administered 2019-06-30: 6 mL via INTRAVENOUS

## 2019-06-30 MED ORDER — ACETAMINOPHEN 325 MG PO TABS
650.0000 mg | ORAL_TABLET | Freq: Four times a day (QID) | ORAL | Status: DC | PRN
  Administered 2019-07-04 – 2019-07-11 (×6): 650 mg via ORAL
  Filled 2019-06-30 (×7): qty 2

## 2019-06-30 MED ORDER — SENNOSIDES-DOCUSATE SODIUM 8.6-50 MG PO TABS: 1.0000 | ORAL_TABLET | Freq: Every evening | ORAL | Status: DC | PRN

## 2019-06-30 MED ORDER — ACETAMINOPHEN 650 MG RE SUPP: 650.0000 mg | Freq: Four times a day (QID) | RECTAL | Status: DC | PRN

## 2019-06-30 MED ORDER — LORAZEPAM 2 MG/ML IJ SOLN
1.0000 mg | Freq: Once | INTRAMUSCULAR | Status: AC | PRN
  Administered 2019-06-30: 1 mg via INTRAVENOUS
  Filled 2019-06-30: qty 1

## 2019-06-30 MED ORDER — DEXAMETHASONE SODIUM PHOSPHATE 10 MG/ML IJ SOLN
8.0000 mg | Freq: Four times a day (QID) | INTRAMUSCULAR | Status: DC
  Administered 2019-07-01 – 2019-07-02 (×6): 8 mg via INTRAVENOUS
  Filled 2019-06-30 (×6): qty 1

## 2019-06-30 NOTE — ED Notes (Signed)
To MRI

## 2019-06-30 NOTE — H&P (Addendum)
Stephen Beard is an 24 y.o. male.   HPI:  24 year old male presented to the ED tonight after having a headache for several months that has progressively gotten worse. He does report some blurry vision in his left eye. Denies any nausea, vomiting, or blurry vision. Denies any significant past medical history. Has been taking goodie powders for his headaches with some relief.   Past Medical History:  Diagnosis Date  . ADHD     History reviewed. No pertinent surgical history.  No Known Allergies  Social History   Tobacco Use  . Smoking status: Current Every Day Smoker  Substance Use Topics  . Alcohol use: Yes    History reviewed. No pertinent family history.   Review of Systems  Positive ROS: as above  All other systems have been reviewed and were otherwise negative with the exception of those mentioned in the HPI and as above.  Objective: Vital signs in last 24 hours: Temp:  [98 F (36.7 C)] 98 F (36.7 C) (04/18 1615) Pulse Rate:  [58] 58 (04/18 1615) Resp:  [16] 16 (04/18 1615) BP: (114)/(74) 114/74 (04/18 1615) SpO2:  [97 %] 97 % (04/18 1615)  General Appearance: Alert, cooperative, no distress, appears stated age Head: Normocephalic, without obvious abnormality, atraumatic Eyes: PERRL, conjunctiva/corneas clear, EOM's intact, fundi benign, both eyes      Lungs: respirations unlabored Heart: Regular rate and rhythm  NEUROLOGIC:   Mental status: A&O x4, no aphasia, good attention span, Memory and fund of knowledge Motor Exam - grossly normal, normal tone and bulk Sensory Exam - grossly normal Reflexes: symmetric, no pathologic reflexes, No Hoffman's, No clonus Coordination - grossly normal Gait - not tested Balance - not tested Cranial Nerves: I: smell Not tested  II: visual acuity  OS: na    OD: na  II: visual fields Left visual field cut  II: pupils Equal, round, reactive to light  III,VII: ptosis None  III,IV,VI: extraocular muscles  Full ROM  V:  mastication Normal  V: facial light touch sensation  Normal  V,VII: corneal reflex  Present  VII: facial muscle function - upper  Normal  VII: facial muscle function - lower Normal  VIII: hearing Not tested  IX: soft palate elevation  Normal  IX,X: gag reflex Present  XI: trapezius strength  5/5  XI: sternocleidomastoid strength 5/5  XI: neck flexion strength  5/5  XII: tongue strength  Normal    Data Review Lab Results  Component Value Date   WBC 8.4 06/30/2019   HGB 16.4 06/30/2019   HCT 46.2 06/30/2019   MCV 91.1 06/30/2019   PLT 228 06/30/2019   Lab Results  Component Value Date   NA 138 06/30/2019   K 4.0 06/30/2019   CL 104 06/30/2019   CO2 22 06/30/2019   BUN 11 06/30/2019   CREATININE 1.07 06/30/2019   GLUCOSE 102 (H) 06/30/2019   No results found for: INR, PROTIME  Radiology: CT Head Wo Contrast  Result Date: 06/30/2019 CLINICAL DATA:  Progressive headache, blurred vision on the left. EXAM: CT HEAD WITHOUT CONTRAST TECHNIQUE: Contiguous axial images were obtained from the base of the skull through the vertex without intravenous contrast. COMPARISON:  None. FINDINGS: Brain: Large mass lesions noted in the right occipital lobe and right medial parietal lobe. At least 2 mass lesions appear to be present measuring up to 4.2 cm and 4.1 cm. There is extensive surrounding vasogenic edema. Mass effect on the lateral ventricles. 21 mm  of right to left midline shift. No hydrocephalus. Vascular: No hyperdense vessel or unexpected calcification. Skull: No acute calvarial abnormality. Sinuses/Orbits: Visualized paranasal sinuses and mastoids clear. Orbital soft tissues unremarkable. Other: None IMPRESSION: Large solid mass lesions which are partially calcified noted in the right parietal and occipital lobes with 21 mm of right to left midline shift. Recommend further evaluation with MRI. Electronically Signed   By: Rolm Baptise M.D.   On: 06/30/2019 20:09    Assessment/Plan: 24  year old presented to the ED tonight with progressive headaches over the last two months. CT head revealed two large masses in the right parietal and occipital lobe with surrounding vasogenic edema and 21 mm of right to left shift, no hydrocephalus. MRI head shows enhancing masses of right occipital lobe causing leftward midline shift and mass effect on the right lateral ventricle. Given his lack of medical history, differential diagnoses are vast. Will admit with further workup and IV decadron.    Stephen Beard Stephen Beard 06/30/2019 11:35 PM

## 2019-06-30 NOTE — ED Triage Notes (Signed)
C/o intermittent headache x 2 months.

## 2019-06-30 NOTE — ED Provider Notes (Signed)
[facility] EMERGENCY DEPARTMENT Provider Note   CSN: [id number] Arrival date & time: 06/30/19  1541     History Chief Complaint  Patient presents with  . Headache    Stephen Beard is a 24 y.o. male.  The history is provided by the patient and medical records. No language interpreter was used.  Headache  Stephen Beard is a 24 y.o. male who presents to the Emergency Department complaining of headache. He presents the emergency department complaining of two months of progressive headache. Headaches are intermittent and were occasionally sporadic without occurring most days. Sometimes awaken from sleep. There located across the front of his head as well as the back. He states that extending his neck back will sometimes alleviate his headache. He has been taking BC powders and goodie powders for these headaches with improvement in his symptoms. He does have associated occasional intermittent blurred vision in his left eye. He denies any fevers, nausea, vomiting, chest pain, numbness, weakness. He has no known medical problems and does not take prescription medications. He smokes occasionally, drinks occasionally does not use any street drugs. No past family history of significant medical issues.    Past Medical History:  Diagnosis Date  . ADHD     There are no problems to display for this patient.   History reviewed. No pertinent surgical history.     No family history on file.  Social History   Tobacco Use  . Smoking status: Current Every Day Smoker  Substance Use Topics  . Alcohol use: Yes  . Drug use: No    Home Medications Prior to Admission medications   Medication Sig Start Date End Date Taking? Authorizing Provider  cyclobenzaprine (FLEXERIL) 10 MG tablet Take 1 tablet (10 mg total) by mouth 3 (three) times daily as needed for muscle spasms. 05/07/14   Street, [place], PA-C  HYDROcodone-acetaminophen (NORCO) 5-325 MG per tablet Take 1-2  tablets by mouth every 6 (six) hours as needed for severe pain. 05/07/14   Street, [place], PA-C  ibuprofen (ADVIL,MOTRIN) 800 MG tablet Take 1 tablet (800 mg total) by mouth 3 (three) times daily. 09/28/14   Bjorn Pippin, PA-C  naproxen (NAPROSYN) 500 MG tablet Take 1 tablet (500 mg total) by mouth 2 (two) times daily as needed for mild pain, moderate pain or headache (TAKE WITH MEALS.). 05/07/14   Street, [place], [place]    Allergies    Patient has no known allergies.  Review of Systems   Review of Systems  Neurological: Positive for headaches.  All other systems reviewed and are negative.   Physical Exam Updated Vital Signs BP 114/74 (BP Location: Left Arm)   Pulse (!) 58   Temp 98 F (36.7 C) (Oral)   Resp 16   SpO2 97%   Physical Exam Vitals and nursing note reviewed.  Constitutional:      Appearance: He is well-developed.  HENT:     Head: Normocephalic and atraumatic.  Eyes:     Extraocular Movements: Extraocular movements intact.     Pupils: Pupils are equal, round, and reactive to light.  Cardiovascular:     Rate and Rhythm: Normal rate and regular rhythm.     Heart sounds: No murmur.  Pulmonary:     Effort: Pulmonary effort is normal. No respiratory distress.     Breath sounds: Normal breath sounds.  Abdominal:     Palpations: Abdomen is soft.     Tenderness: There is no abdominal tenderness. There is  no guarding or rebound.  Musculoskeletal:        General: No tenderness.     Cervical back: Neck supple.  Skin:    General: Skin is warm and dry.  Neurological:     Mental Status: He is alert and oriented to person, place, and time.     Comments: No asymmetry of facial movements. Five out of five strength in all four extremities with sensation light touch intact in all four extremities. Visual fields are grossly intact  Psychiatric:        Behavior: Behavior normal.     ED Results / Procedures / Treatments   Labs (all labs ordered are listed, but only  abnormal results are displayed) Labs Reviewed - No data to display  EKG None  Radiology No results found.  Procedures Procedures (including critical care time)  Medications Ordered in ED Medications - No data to display  ED Course  I have reviewed the triage vital signs and the nursing notes.  Pertinent labs & imaging results that were available during my care of the patient were reviewed by me and considered in my medical decision making (see chart for details).    MDM Rules/Calculators/A&P                     Patient here for evaluation of two months of progressive headaches, intermittent change in vision to the left eye. He is non-toxic appearing on evaluation with no focal neurologic deficits. CT head was obtained that is concerning for multiple masses. Plan to obtain MRI to further evaluate. Discussed with patient findings of CT head and importance of additional imaging. Patient care transferred pending MRI results.  Final Clinical Impression(s) / ED Diagnoses Final diagnoses:  None    Rx / DC Orders ED Discharge Orders    None       Quintella Reichert, MD 06/30/19 2109

## 2019-07-01 ENCOUNTER — Inpatient Hospital Stay (HOSPITAL_COMMUNITY): Payer: Medicaid Other

## 2019-07-01 ENCOUNTER — Other Ambulatory Visit: Payer: Self-pay | Admitting: Radiation Therapy

## 2019-07-01 ENCOUNTER — Encounter (HOSPITAL_COMMUNITY): Payer: Self-pay | Admitting: Neurological Surgery

## 2019-07-01 DIAGNOSIS — C711 Malignant neoplasm of frontal lobe: Secondary | ICD-10-CM | POA: Insufficient documentation

## 2019-07-01 DIAGNOSIS — C719 Malignant neoplasm of brain, unspecified: Secondary | ICD-10-CM | POA: Insufficient documentation

## 2019-07-01 DIAGNOSIS — D496 Neoplasm of unspecified behavior of brain: Secondary | ICD-10-CM | POA: Diagnosis present

## 2019-07-01 DIAGNOSIS — C714 Malignant neoplasm of occipital lobe: Secondary | ICD-10-CM | POA: Diagnosis present

## 2019-07-01 LAB — RESPIRATORY PANEL BY RT PCR (FLU A&B, COVID)
Influenza A by PCR: NEGATIVE
Influenza B by PCR: NEGATIVE
SARS Coronavirus 2 by RT PCR: NEGATIVE

## 2019-07-01 LAB — HIV ANTIBODY (ROUTINE TESTING W REFLEX): HIV Screen 4th Generation wRfx: NONREACTIVE

## 2019-07-01 IMAGING — CT CT HEAD W/O CM
1 of 2 series · 15 of 30 positions shown, 19 images · non-contrast
Comparison: Brain MRI [DATE], head CT [DATE]

CLINICAL DATA: Benign neoplasm, brain/CNS volumetric scan for
intraop navigation.

EXAM:
CT HEAD WITHOUT CONTRAST
TECHNIQUE: Contiguous axial images were obtained from the base of the skull
through the vertex without intravenous contrast.

[Series 3: head 1.0 h30s · axial · 0.49mm/px · z∈[-208,-47]mm · 15 of 177 slices shown, 19 images]
[im 8/177  brain]
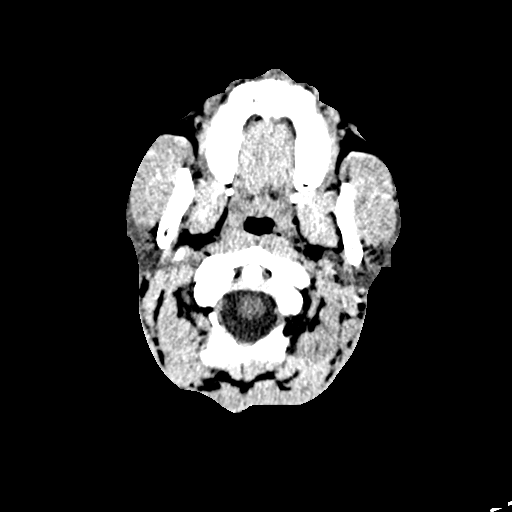
[im 8/177  bone]
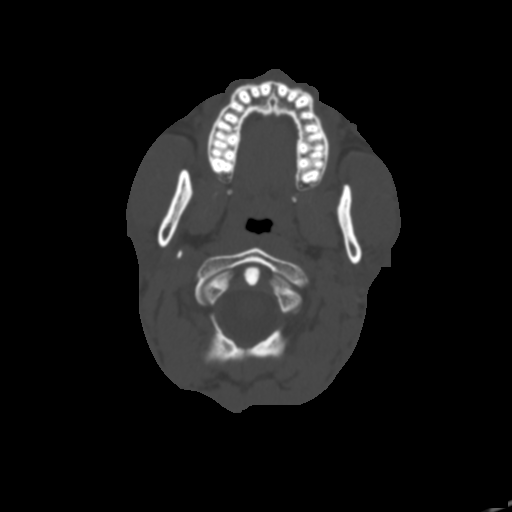
[im 23/177  brain]
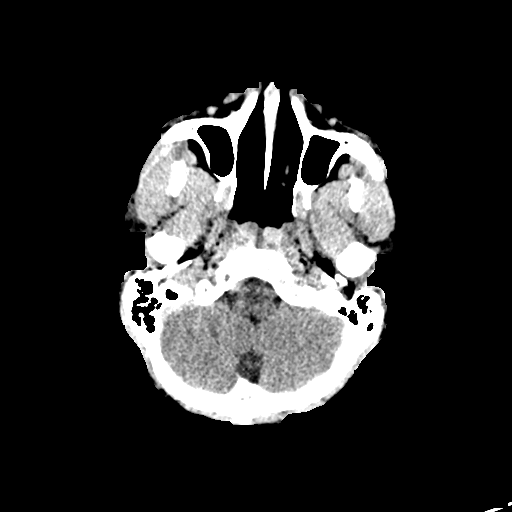
[im 30/177  brain]
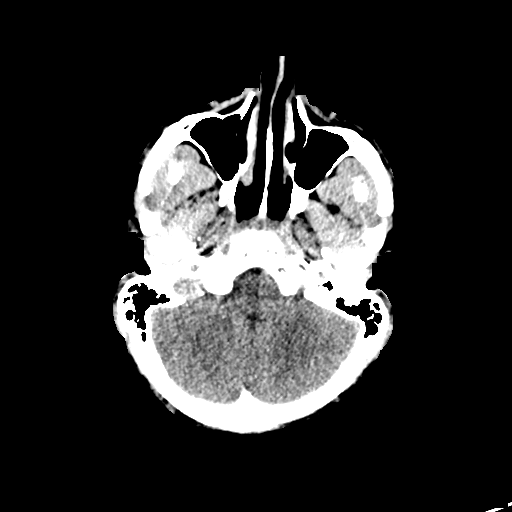
[im 45/177  brain]
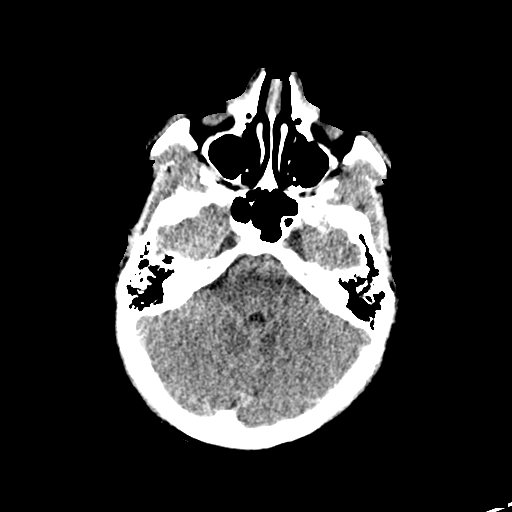
[im 52/177  brain]
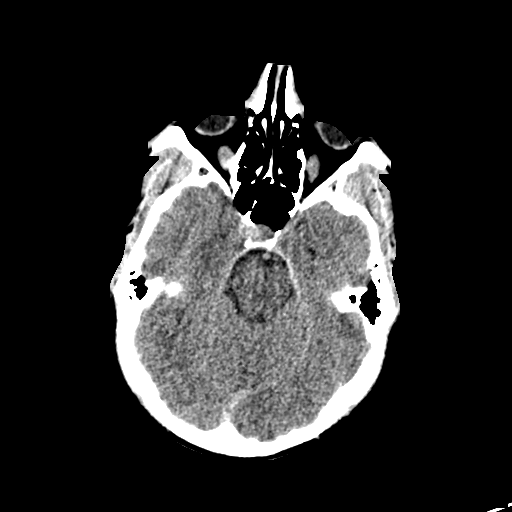
[im 52/177  bone]
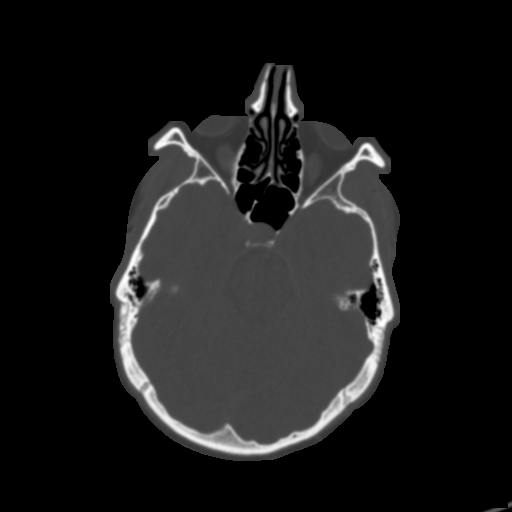
[im 67/177  brain]
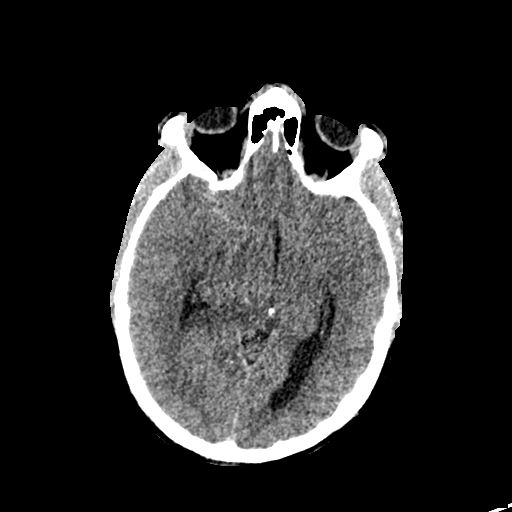
[im 74/177  brain]
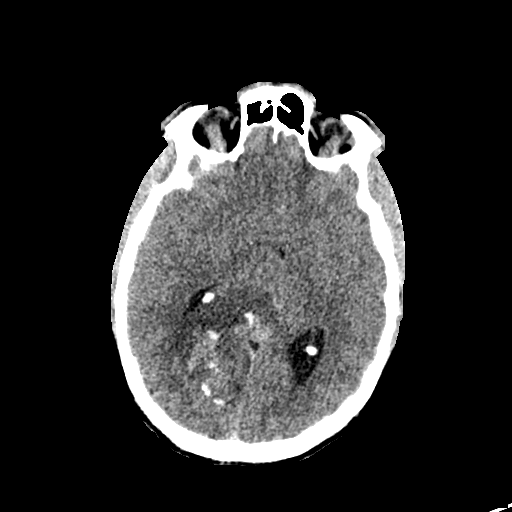
[im 89/177  brain]
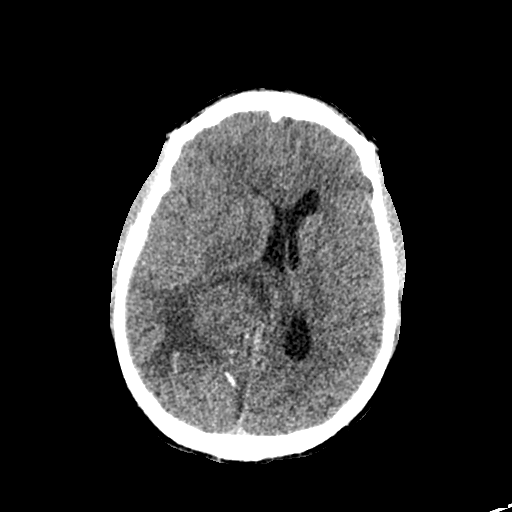
[im 103/177  brain]
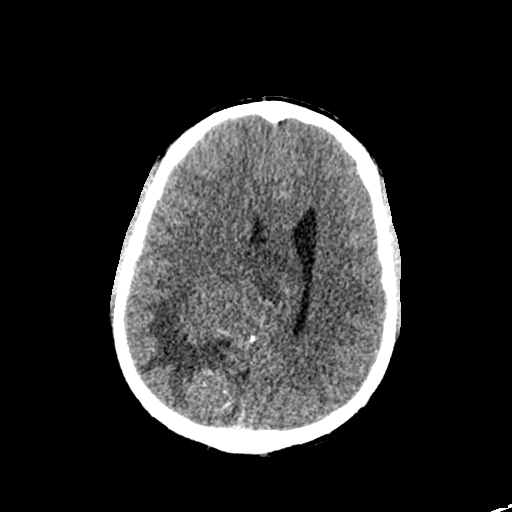
[im 103/177  bone]
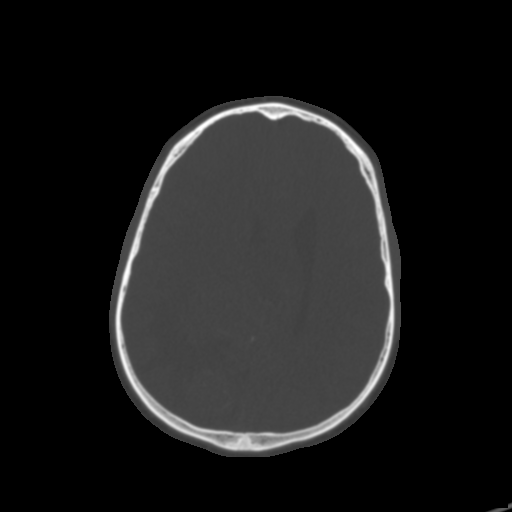
[im 111/177  brain]
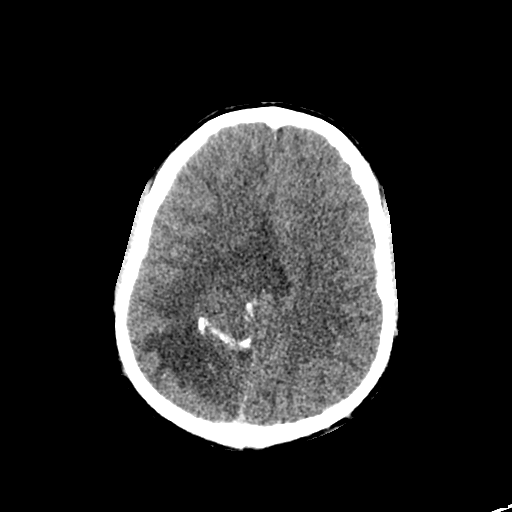
[im 125/177  brain]
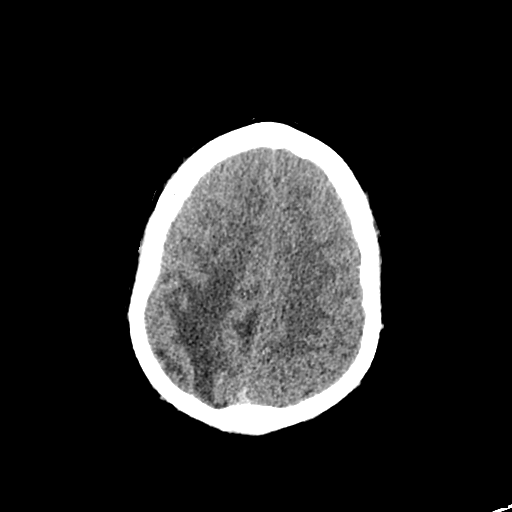
[im 133/177  brain]
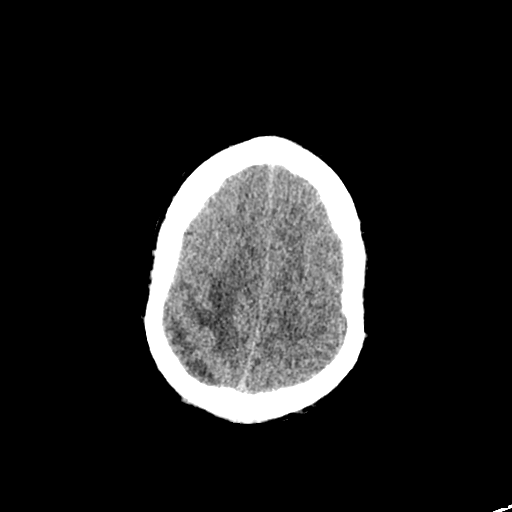
[im 147/177  brain]
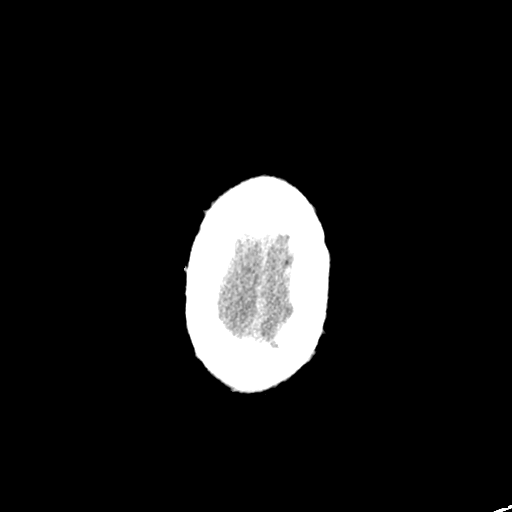
[im 147/177  bone]
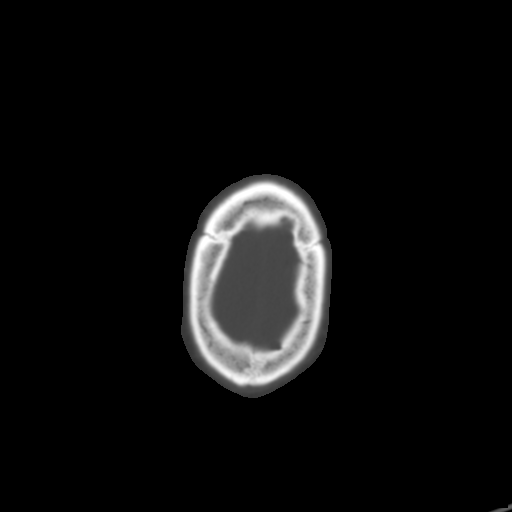
[im 155/177  brain]
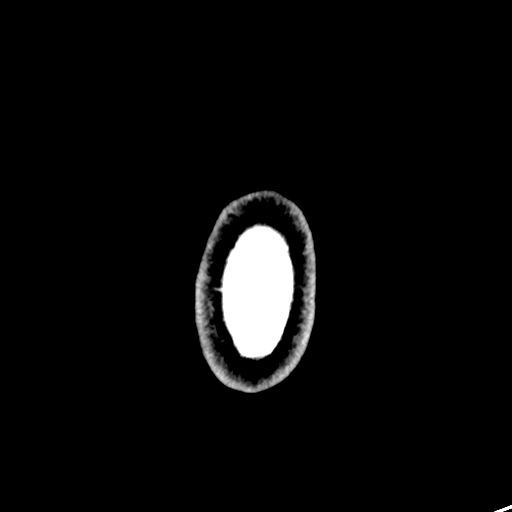
[im 169/177  brain]
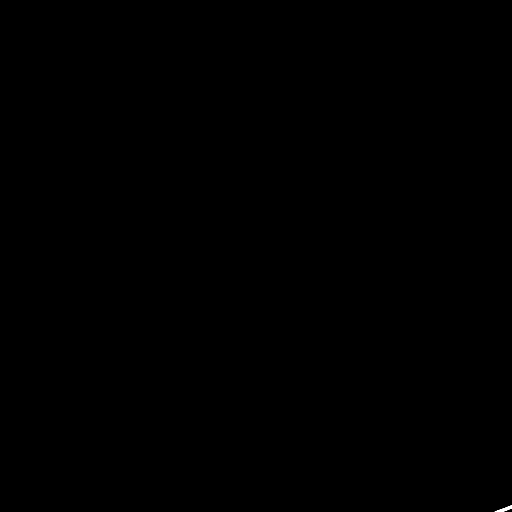

[15 of 30 positions shown; findings below may reference images not displayed]

FINDINGS: Brain:

Examination performed for the purposes of intraoperative navigation.

Again demonstrated are multiple parenchymal masses centered in the
right parietooccipital region. As before, the largest component
measures 4.1 cm. Redemonstrated calcification associated with some
of these masses. Prominent associated edema within the posterior
right cerebral hemisphere and extending into the corpus callosum.
Significant partial effacement of the right lateral and third
ventricles. Unchanged 10 mm leftward midline shift measured at the
level of the septum pellucidum. No interval intracranial
abnormality. No extra-axial fluid collection.

Vascular: No hyperdense vessel.

Skull: Normal. Negative for fracture or focal lesion.

Sinuses/Orbits: Visualized orbits demonstrate no acute abnormality.
No significant paranasal sinus disease or mastoid effusion at the
imaged levels.
IMPRESSION: CT imaging performed for the purposes of intraoperative navigation.

Redemonstrated are multiple parenchymal masses centered within the
right parietooccipital region, some of which have associated
calcification. Prominent edema within the posterior right cerebral
hemisphere and extending into the corpus callosum. Please refer to
recent MRI head [DATE] for favored differential considerations.

Mass effect with significant partial effacement of the right lateral
and third ventricles. Unchanged 10 mm leftward midline shift.

## 2019-07-01 MED ORDER — NICOTINE 21 MG/24HR TD PT24
21.0000 mg | MEDICATED_PATCH | Freq: Every day | TRANSDERMAL | Status: DC
  Administered 2019-07-01 – 2019-07-11 (×10): 21 mg via TRANSDERMAL
  Filled 2019-07-01 (×9): qty 1

## 2019-07-01 NOTE — ED Notes (Signed)
Pt transported to CT ?

## 2019-07-01 NOTE — ED Notes (Signed)
Lunch Tray Ordered @ 1737. 

## 2019-07-01 NOTE — ED Notes (Signed)
Pt given special permission by Charge RN to switch visitors- mother swapped out with boyfriend, boyfriend at bedside currently. Pt denies any needs at the moment.

## 2019-07-01 NOTE — ED Notes (Signed)
Breakfast ordered 

## 2019-07-01 NOTE — ED Notes (Signed)
Caught patient smoking in bathroom. Informed patient he CANNOT smoke on hospital property. Patient apologized.

## 2019-07-01 NOTE — ED Notes (Signed)
Pt ambulatory to and from RR with steady gait

## 2019-07-01 NOTE — Progress Notes (Signed)
Neurosurgery Service Progress Note  Subjective: No acute events overnight, taking over care for Dr. Saintclair Halsted. H&P reviewed and discussed history w/ pt and his boyfriend. Pt has been having progressive personality changes, visual loss, some episodic dysgeusia / prosopagnosia, auditory / visual hallucinations  Objective: Vitals:   07/01/19 0445 07/01/19 0715 07/01/19 0745 07/01/19 0855  BP: 92/66 111/75 108/76 119/79  Pulse: 62 60 (!) 56 88  Resp: 14 19 18 19   Temp:      TempSrc:      SpO2: 95% 99% 97% 95%   Temp (24hrs), Avg:98 F (36.7 C), Min:98 F (36.7 C), Max:98 F (36.7 C)  CBC Latest Ref Rng & Units 06/30/2019 02/08/2019  WBC 4.0 - 10.5 K/uL 8.4 8.4  Hemoglobin 13.0 - 17.0 g/dL 16.4 16.4  Hematocrit 39.0 - 52.0 % 46.2 47.7  Platelets 150 - 400 K/uL 228 214   BMP Latest Ref Rng & Units 06/30/2019 02/08/2019  Glucose 70 - 99 mg/dL 102(H) 106(H)  BUN 6 - 20 mg/dL 11 15  Creatinine 0.61 - 1.24 mg/dL 1.07 0.98  Sodium 135 - 145 mmol/L 138 139  Potassium 3.5 - 5.1 mmol/L 4.0 3.9  Chloride 98 - 111 mmol/L 104 104  CO2 22 - 32 mmol/L 22 25  Calcium 8.9 - 10.3 mg/dL 9.3 9.9   No intake or output data in the 24 hours ending 07/01/19 1003  Current Facility-Administered Medications:  .  0.9 %  sodium chloride infusion, , Intravenous, Continuous, Meyran, [name], NP, Last Rate: 50 mL/hr at 07/01/19 0857, New Bag at 07/01/19 0857 .  acetaminophen (TYLENOL) tablet 650 mg, 650 mg, Oral, Q6H PRN **OR** acetaminophen (TYLENOL) suppository 650 mg, 650 mg, Rectal, Q6H PRN, Meyran, [name], NP .  dexamethasone (DECADRON) injection 8 mg, 8 mg, Intravenous, Q6H, Meyran, [name], NP, 8 mg at 07/01/19 0705 .  HYDROcodone-acetaminophen (NORCO/VICODIN) 5-325 MG per tablet 1-2 tablet, 1-2 tablet, Oral, Q4H PRN, Meyran, [name], NP, 2 tablet at 07/01/19 0857 .  nicotine (NICODERM CQ - dosed in mg/24 hours) patch 21 mg, 21 mg, Transdermal, Daily, Kary Kos, MD .   ondansetron [facility]) tablet 4 mg, 4 mg, Oral, Q6H PRN **OR** ondansetron (ZOFRAN) injection 4 mg, 4 mg, Intravenous, Q6H PRN, Meyran, [name], NP .  senna-docusate (Senokot-S) tablet 1 tablet, 1 tablet, Oral, QHS PRN, Meyran, [name], NP  Current Outpatient Medications:  .  amphetamine-dextroamphetamine (ADDERALL) 20 MG tablet, Take 20 mg by mouth 2 (two) times daily., Disp: , Rfl:    Physical Exam: AOx3, PERRL, +left homonymous hemianopsia, some occasional confusion / inappropriate speech, EOMI but c/o diplopia in all directions, FS, Strength 5/5 x4, SILTx4, +mild LUE drift  Assessment & Plan: 24 y.o. 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  -OR for resection tomorrow -NPO p MN -volumetric CTH w/o contrast for use with brainlab -cont steroids -will start AEDs if he continues to have the hallucinations post-op, sounds somewhat consistent with ictal / post-ictal psychosis  Judith Part  07/01/19 10:03 AM

## 2019-07-01 NOTE — ED Notes (Signed)
Lunch Tray Ordered @ 1111. 

## 2019-07-01 NOTE — ED Notes (Signed)
Attending MD paged to inquire about PRN Nicotine patch per pt request

## 2019-07-02 ENCOUNTER — Inpatient Hospital Stay (HOSPITAL_COMMUNITY): Payer: Medicaid Other | Admitting: Certified Registered Nurse Anesthetist

## 2019-07-02 ENCOUNTER — Encounter (HOSPITAL_COMMUNITY)
Admission: EM | Disposition: A | Discharge status: Home / Self Care | Payer: Self-pay | Source: Home / Self Care | Attending: Neurological Surgery

## 2019-07-02 HISTORY — PX: APPLICATION OF CRANIAL NAVIGATION: SHX6578

## 2019-07-02 HISTORY — PX: CRANIOTOMY: SHX93

## 2019-07-02 LAB — POCT I-STAT 7, (LYTES, BLD GAS, ICA,H+H)
Acid-base deficit: 1 mmol/L (ref 0.0–2.0)
Acid-base deficit: 3 mmol/L — ABNORMAL HIGH (ref 0.0–2.0)
Acid-base deficit: 3 mmol/L — ABNORMAL HIGH (ref 0.0–2.0)
Bicarbonate: 23.4 mmol/L (ref 20.0–28.0)
Bicarbonate: 21.3 mmol/L (ref 20.0–28.0)
Bicarbonate: 22.1 mmol/L (ref 20.0–28.0)
Calcium, Ion: 1.2 mmol/L (ref 1.15–1.40)
Calcium, Ion: 1.23 mmol/L (ref 1.15–1.40)
Calcium, Ion: 1.23 mmol/L (ref 1.15–1.40)
HCT: 40 % (ref 39.0–52.0)
HCT: 36 % — ABNORMAL LOW (ref 39.0–52.0)
HCT: 22 % — ABNORMAL LOW (ref 39.0–52.0)
Hemoglobin: 13.6 g/dL (ref 13.0–17.0)
Hemoglobin: 12.2 g/dL — ABNORMAL LOW (ref 13.0–17.0)
Hemoglobin: 7.5 g/dL — ABNORMAL LOW (ref 13.0–17.0)
O2 Saturation: 100 %
O2 Saturation: 100 %
O2 Saturation: 100 %
Patient temperature: 35.2
Patient temperature: 35.9
Patient temperature: 36.2
Potassium: 3.9 mmol/L (ref 3.5–5.1)
Potassium: 3.9 mmol/L (ref 3.5–5.1)
Potassium: 3.8 mmol/L (ref 3.5–5.1)
Sodium: 140 mmol/L (ref 135–145)
Sodium: 144 mmol/L (ref 135–145)
Sodium: 147 mmol/L — ABNORMAL HIGH (ref 135–145)
TCO2: 25 mmol/L (ref 22–32)
TCO2: 22 mmol/L (ref 22–32)
TCO2: 23 mmol/L (ref 22–32)
pCO2 arterial: 36.2 mmHg (ref 32.0–48.0)
pCO2 arterial: 33.3 mmHg (ref 32.0–48.0)
pCO2 arterial: 37.3 mmHg (ref 32.0–48.0)
pH, Arterial: 7.411 (ref 7.350–7.450)
pH, Arterial: 7.409 (ref 7.350–7.450)
pH, Arterial: 7.377 (ref 7.350–7.450)
pO2, Arterial: 362 mmHg — ABNORMAL HIGH (ref 83.0–108.0)
pO2, Arterial: 232 mmHg — ABNORMAL HIGH (ref 83.0–108.0)
pO2, Arterial: 257 mmHg — ABNORMAL HIGH (ref 83.0–108.0)

## 2019-07-02 LAB — PREPARE RBC (CROSSMATCH)

## 2019-07-02 LAB — ABO/RH: ABO/RH(D): A POS

## 2019-07-02 SURGERY — CRANIOTOMY TUMOR EXCISION
Anesthesia: General | Site: Head | Laterality: Right

## 2019-07-02 MED ORDER — SODIUM CHLORIDE 0.9 % IV SOLN: INTRAVENOUS | Status: DC

## 2019-07-02 MED ORDER — SUGAMMADEX SODIUM 200 MG/2ML IV SOLN
INTRAVENOUS | Status: DC | PRN
  Administered 2019-07-02: 200 mg via INTRAVENOUS

## 2019-07-02 MED ORDER — LIDOCAINE-EPINEPHRINE 1 %-1:100000 IJ SOLN
INTRAMUSCULAR | Status: DC | PRN
  Administered 2019-07-02: 6 mL

## 2019-07-02 MED ORDER — ACETAMINOPHEN 500 MG PO TABS: 1000.0000 mg | ORAL_TABLET | Freq: Once | ORAL | Status: DC | PRN

## 2019-07-02 MED ORDER — ROCURONIUM BROMIDE 10 MG/ML (PF) SYRINGE
PREFILLED_SYRINGE | INTRAVENOUS | Status: AC
  Filled 2019-07-02: qty 10

## 2019-07-02 MED ORDER — ONDANSETRON HCL 4 MG/2ML IJ SOLN
INTRAMUSCULAR | Status: AC
  Filled 2019-07-02: qty 2

## 2019-07-02 MED ORDER — SODIUM CHLORIDE 0.9 % IV SOLN: INTRAVENOUS | Status: DC | PRN

## 2019-07-02 MED ORDER — SODIUM CHLORIDE 0.9% IV SOLUTION: Freq: Once | INTRAVENOUS | Status: DC

## 2019-07-02 MED ORDER — ESMOLOL HCL 100 MG/10ML IV SOLN
INTRAVENOUS | Status: AC
  Filled 2019-07-02: qty 10

## 2019-07-02 MED ORDER — HYDROMORPHONE HCL 1 MG/ML IJ SOLN
1.0000 mg | INTRAMUSCULAR | Status: DC | PRN
  Administered 2019-07-02 – 2019-07-03 (×5): 1 mg via INTRAVENOUS
  Filled 2019-07-02 (×6): qty 1

## 2019-07-02 MED ORDER — 0.9 % SODIUM CHLORIDE (POUR BTL) OPTIME
TOPICAL | Status: DC | PRN
  Administered 2019-07-02 (×3): 1000 mL

## 2019-07-02 MED ORDER — THROMBIN 20000 UNITS EX SOLR
CUTANEOUS | Status: DC | PRN
  Administered 2019-07-02: 20 mL via TOPICAL

## 2019-07-02 MED ORDER — CEFAZOLIN SODIUM-DEXTROSE 2-3 GM-%(50ML) IV SOLR
INTRAVENOUS | Status: DC | PRN
  Administered 2019-07-02: 2 g via INTRAVENOUS

## 2019-07-02 MED ORDER — ROCURONIUM BROMIDE 10 MG/ML (PF) SYRINGE
PREFILLED_SYRINGE | INTRAVENOUS | Status: DC | PRN
  Administered 2019-07-02: 50 mg via INTRAVENOUS
  Administered 2019-07-02 (×2): 30 mg via INTRAVENOUS
  Administered 2019-07-02: 40 mg via INTRAVENOUS
  Administered 2019-07-02 (×3): 30 mg via INTRAVENOUS
  Administered 2019-07-02: 40 mg via INTRAVENOUS
  Administered 2019-07-02: 50 mg via INTRAVENOUS
  Administered 2019-07-02: 30 mg via INTRAVENOUS
  Administered 2019-07-02: 70 mg via INTRAVENOUS

## 2019-07-02 MED ORDER — ACETAMINOPHEN 160 MG/5ML PO SOLN: 1000.0000 mg | Freq: Once | ORAL | Status: DC | PRN

## 2019-07-02 MED ORDER — CHLORHEXIDINE GLUCONATE CLOTH 2 % EX PADS
6.0000 | MEDICATED_PAD | Freq: Every day | CUTANEOUS | Status: DC
  Administered 2019-07-02 – 2019-07-12 (×9): 6 via TOPICAL

## 2019-07-02 MED ORDER — THROMBIN 5000 UNITS EX SOLR
OROMUCOSAL | Status: DC | PRN
  Administered 2019-07-02: 5 mL via TOPICAL

## 2019-07-02 MED ORDER — BACITRACIN ZINC 500 UNIT/GM EX OINT
TOPICAL_OINTMENT | CUTANEOUS | Status: DC | PRN
  Administered 2019-07-02: 1 via TOPICAL

## 2019-07-02 MED ORDER — PROPOFOL 10 MG/ML IV BOLUS
INTRAVENOUS | Status: DC | PRN
  Administered 2019-07-02: 150 mg via INTRAVENOUS
  Administered 2019-07-02 (×2): 50 mg via INTRAVENOUS

## 2019-07-02 MED ORDER — OXYCODONE HCL 5 MG PO TABS: 5.0000 mg | ORAL_TABLET | Freq: Once | ORAL | Status: DC | PRN

## 2019-07-02 MED ORDER — CEFAZOLIN SODIUM-DEXTROSE 1-4 GM/50ML-% IV SOLN
INTRAVENOUS | Status: DC | PRN
  Administered 2019-07-02 (×2): 2 g via INTRAVENOUS

## 2019-07-02 MED ORDER — LIDOCAINE-EPINEPHRINE 1 %-1:100000 IJ SOLN
INTRAMUSCULAR | Status: AC
  Filled 2019-07-02: qty 1

## 2019-07-02 MED ORDER — SODIUM CHLORIDE 0.9 % IV SOLN
INTRAVENOUS | Status: DC | PRN
  Administered 2019-07-02: 20 ug/min via INTRAVENOUS

## 2019-07-02 MED ORDER — FENTANYL CITRATE (PF) 250 MCG/5ML IJ SOLN
INTRAMUSCULAR | Status: AC
  Filled 2019-07-02: qty 5

## 2019-07-02 MED ORDER — ALBUMIN HUMAN 5 % IV SOLN: INTRAVENOUS | Status: DC | PRN

## 2019-07-02 MED ORDER — PROPOFOL 10 MG/ML IV BOLUS
INTRAVENOUS | Status: AC
  Filled 2019-07-02: qty 20

## 2019-07-02 MED ORDER — FENTANYL CITRATE (PF) 100 MCG/2ML IJ SOLN
INTRAMUSCULAR | Status: DC | PRN
  Administered 2019-07-02: 150 ug via INTRAVENOUS

## 2019-07-02 MED ORDER — THROMBIN 5000 UNITS EX SOLR
CUTANEOUS | Status: AC
  Filled 2019-07-02: qty 5000

## 2019-07-02 MED ORDER — MIDAZOLAM HCL 2 MG/2ML IJ SOLN
INTRAMUSCULAR | Status: AC
  Filled 2019-07-02: qty 2

## 2019-07-02 MED ORDER — BACITRACIN ZINC 500 UNIT/GM EX OINT
TOPICAL_OINTMENT | CUTANEOUS | Status: AC
  Filled 2019-07-02: qty 28.35

## 2019-07-02 MED ORDER — OXYCODONE HCL 5 MG/5ML PO SOLN: 5.0000 mg | Freq: Once | ORAL | Status: DC | PRN

## 2019-07-02 MED ORDER — PROPOFOL 500 MG/50ML IV EMUL
INTRAVENOUS | Status: DC | PRN
  Administered 2019-07-02: 75 ug/kg/min via INTRAVENOUS
  Administered 2019-07-02 (×2): 140 ug/kg/min via INTRAVENOUS

## 2019-07-02 MED ORDER — FENTANYL CITRATE (PF) 100 MCG/2ML IJ SOLN: 25.0000 ug | INTRAMUSCULAR | Status: DC | PRN

## 2019-07-02 MED ORDER — SODIUM CHLORIDE 0.9 % IV SOLN
0.0500 ug/kg/min | INTRAVENOUS | Status: AC
  Administered 2019-07-02: .2 ug/kg/min via INTRAVENOUS
  Filled 2019-07-02: qty 4000

## 2019-07-02 MED ORDER — SODIUM CHLORIDE 0.9 % IV SOLN
INTRAVENOUS | Status: DC | PRN
  Administered 2019-07-02: 500 mL

## 2019-07-02 MED ORDER — CEFAZOLIN SODIUM 1 G IJ SOLR
INTRAMUSCULAR | Status: AC
  Filled 2019-07-02: qty 20

## 2019-07-02 MED ORDER — THROMBIN 20000 UNITS EX SOLR
CUTANEOUS | Status: AC
  Filled 2019-07-02: qty 20000

## 2019-07-02 MED ORDER — ACETAMINOPHEN 10 MG/ML IV SOLN: 1000.0000 mg | Freq: Once | INTRAVENOUS | Status: DC | PRN

## 2019-07-02 MED ORDER — LIDOCAINE 2% (20 MG/ML) 5 ML SYRINGE
INTRAMUSCULAR | Status: DC | PRN
  Administered 2019-07-02: 60 mg via INTRAVENOUS

## 2019-07-02 MED ORDER — LABETALOL HCL 5 MG/ML IV SOLN
INTRAVENOUS | Status: DC | PRN
  Administered 2019-07-02 (×2): 10 mg via INTRAVENOUS

## 2019-07-02 MED ORDER — MICROFIBRILLAR COLL HEMOSTAT EX PADS
MEDICATED_PAD | CUTANEOUS | Status: DC | PRN
  Administered 2019-07-02: 1 via TOPICAL

## 2019-07-02 MED ORDER — DEXAMETHASONE SODIUM PHOSPHATE 10 MG/ML IJ SOLN
INTRAMUSCULAR | Status: DC | PRN
  Administered 2019-07-02: 10 mg via INTRAVENOUS

## 2019-07-02 MED ORDER — PROPOFOL 1000 MG/100ML IV EMUL
INTRAVENOUS | Status: AC
  Filled 2019-07-02: qty 100

## 2019-07-02 MED ORDER — LIDOCAINE 2% (20 MG/ML) 5 ML SYRINGE
INTRAMUSCULAR | Status: AC
  Filled 2019-07-02: qty 5

## 2019-07-02 MED ORDER — LABETALOL HCL 5 MG/ML IV SOLN
INTRAVENOUS | Status: AC
  Filled 2019-07-02: qty 4

## 2019-07-02 MED ORDER — ARTIFICIAL TEARS OPHTHALMIC OINT
TOPICAL_OINTMENT | OPHTHALMIC | Status: AC
  Filled 2019-07-02: qty 3.5

## 2019-07-02 MED ORDER — ARTIFICIAL TEARS OPHTHALMIC OINT
TOPICAL_OINTMENT | OPHTHALMIC | Status: DC | PRN
  Administered 2019-07-02: 1 via OPHTHALMIC

## 2019-07-02 MED ORDER — CEFAZOLIN SODIUM-DEXTROSE 2-4 GM/100ML-% IV SOLN
INTRAVENOUS | Status: AC
  Filled 2019-07-02: qty 100

## 2019-07-02 SURGICAL SUPPLY — 97 items
BENZOIN TINCTURE PRP APPL 2/3 (GAUZE/BANDAGES/DRESSINGS) IMPLANT
BLADE CLIPPER SURG (BLADE) ×3 IMPLANT
BLADE SAW GIGLI 16 STRL (MISCELLANEOUS) IMPLANT
BLADE SURG 15 STRL LF DISP TIS (BLADE) IMPLANT
BLADE SURG 15 STRL SS (BLADE)
BNDG GAUZE ELAST 4 BULKY (GAUZE/BANDAGES/DRESSINGS) IMPLANT
BNDG STRETCH 4X75 STRL LF (GAUZE/BANDAGES/DRESSINGS) IMPLANT
BUR ACORN 9.0 PRECISION (BURR) ×3 IMPLANT
BUR ROUND FLUTED 4 SOFT TCH (BURR) IMPLANT
BUR SPIRAL ROUTER 2.3 (BUR) ×3 IMPLANT
CANISTER SUCT 3000ML PPV (MISCELLANEOUS) ×6 IMPLANT
CATH VENTRIC 35X38 W/TROCAR LG (CATHETERS) IMPLANT
CLIP VESOCCLUDE MED 6/CT (CLIP) IMPLANT
CNTNR URN SCR LID CUP LEK RST (MISCELLANEOUS) ×2 IMPLANT
CONT SPEC 4OZ STRL OR WHT (MISCELLANEOUS) ×3
COVER BURR HOLE UNIV 10 (Orthopedic Implant) ×6 IMPLANT
COVER MAYO STAND STRL (DRAPES) IMPLANT
COVER WAND RF STERILE (DRAPES) IMPLANT
DECANTER SPIKE VIAL GLASS SM (MISCELLANEOUS) ×3 IMPLANT
DERMABOND ADVANCED (GAUZE/BANDAGES/DRESSINGS) ×1
DERMABOND ADVANCED .7 DNX12 (GAUZE/BANDAGES/DRESSINGS) ×2 IMPLANT
DRAIN SUBARACHNOID (WOUND CARE) IMPLANT
DRAPE HALF SHEET 40X57 (DRAPES) IMPLANT
DRAPE MICROSCOPE LEICA (MISCELLANEOUS) ×3 IMPLANT
DRAPE NEUROLOGICAL W/INCISE (DRAPES) ×3 IMPLANT
DRAPE STERI IOBAN 125X83 (DRAPES) IMPLANT
DRAPE SURG 17X23 STRL (DRAPES) IMPLANT
DRAPE WARM FLUID 44X44 (DRAPES) ×3 IMPLANT
DRSG ADAPTIC 3X8 NADH LF (GAUZE/BANDAGES/DRESSINGS) IMPLANT
DRSG TELFA 3X8 NADH (GAUZE/BANDAGES/DRESSINGS) IMPLANT
DURAPREP 6ML APPLICATOR 50/CS (WOUND CARE) ×6 IMPLANT
ELECT REM PT RETURN 9FT ADLT (ELECTROSURGICAL) ×3
ELECTRODE REM PT RTRN 9FT ADLT (ELECTROSURGICAL) ×2 IMPLANT
EVACUATOR 1/8 PVC DRAIN (DRAIN) IMPLANT
EVACUATOR SILICONE 100CC (DRAIN) IMPLANT
FORCEPS BIPO MALIS IRRIG 9X1.5 (NEUROSURGERY SUPPLIES) ×3 IMPLANT
FORCEPS BIPOLAR SPETZLER 8 1.0 (NEUROSURGERY SUPPLIES) IMPLANT
GAUZE 4X4 16PLY RFD (DISPOSABLE) IMPLANT
GAUZE SPONGE 4X4 12PLY STRL (GAUZE/BANDAGES/DRESSINGS) IMPLANT
GLOVE BIO SURGEON STRL SZ7 (GLOVE) ×3 IMPLANT
GLOVE BIO SURGEON STRL SZ7.5 (GLOVE) ×6 IMPLANT
GLOVE BIOGEL PI IND STRL 7.0 (GLOVE) IMPLANT
GLOVE BIOGEL PI IND STRL 7.5 (GLOVE) ×6 IMPLANT
GLOVE BIOGEL PI INDICATOR 7.0 (GLOVE)
GLOVE BIOGEL PI INDICATOR 7.5 (GLOVE) ×3
GLOVE ECLIPSE 7.0 STRL STRAW (GLOVE) ×3 IMPLANT
GLOVE EXAM NITRILE LRG STRL (GLOVE) IMPLANT
GLOVE EXAM NITRILE XL STR (GLOVE) IMPLANT
GLOVE EXAM NITRILE XS STR PU (GLOVE) IMPLANT
GLOVE SURG SS PI 7.0 STRL IVOR (GLOVE) ×12 IMPLANT
GOWN STRL REUS W/ TWL LRG LVL3 (GOWN DISPOSABLE) ×2 IMPLANT
GOWN STRL REUS W/ TWL XL LVL3 (GOWN DISPOSABLE) ×4 IMPLANT
GOWN STRL REUS W/TWL 2XL LVL3 (GOWN DISPOSABLE) IMPLANT
GOWN STRL REUS W/TWL LRG LVL3 (GOWN DISPOSABLE) ×3
GOWN STRL REUS W/TWL XL LVL3 (GOWN DISPOSABLE) ×6
HEMOSTAT POWDER KIT SURGIFOAM (HEMOSTASIS) ×3 IMPLANT
HEMOSTAT SURGICEL 2X14 (HEMOSTASIS) ×3 IMPLANT
HOOK DURA 1/2IN (MISCELLANEOUS) ×3 IMPLANT
IV NS 1000ML (IV SOLUTION) ×3
IV NS 1000ML BAXH (IV SOLUTION) ×2 IMPLANT
KIT BASIN OR (CUSTOM PROCEDURE TRAY) ×3 IMPLANT
KIT DRAIN CSF ACCUDRAIN (MISCELLANEOUS) IMPLANT
KIT TURNOVER KIT B (KITS) ×3 IMPLANT
MARKER SPHERE PSV REFLC 13MM (MARKER) ×6 IMPLANT
NEEDLE HYPO 22GX1.5 SAFETY (NEEDLE) ×3 IMPLANT
NEEDLE SPNL 18GX3.5 QUINCKE PK (NEEDLE) IMPLANT
NS IRRIG 1000ML POUR BTL (IV SOLUTION) ×9 IMPLANT
PACK BATTERY CMF DISP FOR DVR (ORTHOPEDIC DISPOSABLE SUPPLIES) ×3 IMPLANT
PACK CRANIOTOMY CUSTOM (CUSTOM PROCEDURE TRAY) ×3 IMPLANT
PATTIES SURGICAL .25X.25 (GAUZE/BANDAGES/DRESSINGS) IMPLANT
PATTIES SURGICAL .5 X.5 (GAUZE/BANDAGES/DRESSINGS) ×3 IMPLANT
PATTIES SURGICAL .5 X3 (DISPOSABLE) IMPLANT
PATTIES SURGICAL 1/4 X 3 (GAUZE/BANDAGES/DRESSINGS) IMPLANT
PATTIES SURGICAL 1X1 (DISPOSABLE) ×6 IMPLANT
PIN MAYFIELD SKULL DISP (PIN) ×3 IMPLANT
PLATE CMF UNIV LG (Plate) ×3 IMPLANT
RUBBERBAND STERILE (MISCELLANEOUS) ×6 IMPLANT
SCREW UNIII AXS SD 1.5X4 (Screw) ×45 IMPLANT
SET CARTRIDGE AND TUBING (SET/KITS/TRAYS/PACK) ×3 IMPLANT
SPECIMEN JAR SMALL (MISCELLANEOUS) IMPLANT
SPONGE NEURO XRAY DETECT 1X3 (DISPOSABLE) IMPLANT
SPONGE SURGIFOAM ABS GEL 100 (HEMOSTASIS) ×3 IMPLANT
STAPLER VISISTAT 35W (STAPLE) ×3 IMPLANT
SUT ETHILON 3 0 FSL (SUTURE) IMPLANT
SUT ETHILON 3 0 PS 1 (SUTURE) IMPLANT
SUT MNCRL AB 3-0 PS2 18 (SUTURE) IMPLANT
SUT NURALON 4 0 TR CR/8 (SUTURE) ×6 IMPLANT
SUT SILK 0 TIES 10X30 (SUTURE) IMPLANT
SUT VIC AB 2-0 CP2 18 (SUTURE) ×6 IMPLANT
TIP SHEAR CVD EXTENDED 36KH (INSTRUMENTS) ×3 IMPLANT
TOWEL GREEN STERILE (TOWEL DISPOSABLE) ×3 IMPLANT
TOWEL GREEN STERILE FF (TOWEL DISPOSABLE) ×3 IMPLANT
TRAY FOLEY MTR SLVR 16FR STAT (SET/KITS/TRAYS/PACK) ×3 IMPLANT
TUBE CONNECTING 12X1/4 (SUCTIONS) ×3 IMPLANT
UNDERPAD 30X30 (UNDERPADS AND DIAPERS) IMPLANT
WATER STERILE IRR 1000ML POUR (IV SOLUTION) ×3 IMPLANT
WRENCH TORQUE 36KHZ (INSTRUMENTS) ×3 IMPLANT

## 2019-07-02 NOTE — ED Notes (Signed)
MotherKenney Houseman-- [phone]

## 2019-07-02 NOTE — Op Note (Signed)
PATIENT: Stephen Beard  DAY OF SURGERY: 07/02/19   PRE-OPERATIVE DIAGNOSIS:  Brain tumor   POST-OPERATIVE DIAGNOSIS:  Brain tumor   PROCEDURE:  Right occipital craniotomy for resection of brain tumor, use of frameless stereotaxy   SURGEON:  Surgeon(s) and Role:    Judith Part, MD - Primary   ANESTHESIA: ETGA   BRIEF HISTORY: This is a 24 year old man who presented with headaches and multiple progressive neurologic deficits. 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 This was discussed with the patient as well as risks, benefits, and alternatives and wished to proceed with surgery.   OPERATIVE DETAIL: The patient was taken to the operating room. A formal time out was performed with two patient identifiers and confirmed the operative site. Anesthesia was induced by the anesthesia team. The [place] head holder was applied to the head. The patient was then placed prone on the OR table. A registration array was attached to the [place]. This was co-registered with the patient's preoperative imaging, the fit appeared to be acceptable. Using frameless stereotaxy, the operative trajectory was planned and the incision was marked. Hair was clipped with surgical clippers over the incision and the area was then prepped and draped in a sterile fashion.  A linear incision was placed in the right occipital region to bisect the tumor. Soft tissues were retracted. A craniotomy flap was turned to expose just lateral to the SSS and superior to the TS. The dura was opened and anatomy confirmed with landmarks and frameless stereotaxy.   The tumor was located without difficulty, it was grossly abnormal and very hypervascular. 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 I proceeded to resect the most posterior / superficial two nodules of tumor, which proved as difficult as expected. The margins were  indistinct, the tumor was very vascular. After those nodules were removed, I performed debulking of the remaining nodule of tumor, but did not resect it circumferentially due to the eloquent structures surrounding it. The residual tumor, as expected, continued to ooze. This was stopped with thrombin soaked gelfoam, which was left in the cavity for continued hemostasis. Multiple rounds of irrigation returned clear with good hemostasis after those were placed.   The dura was then reapproximated with sutures. The specimen was sent to pathology for permanent, gelfoam was placed in the epidural space, and the craniotomy flap was replaced with titanium plates and screws.   All instrument and sponge counts were correct, the incision was then closed in layers. The patient was then returned to anesthesia for emergence. No apparent complications at the completion of the procedure.   EBL:  1737mL   DRAINS: none   SPECIMENS: Right occipital brain tumor   Judith Part, MD 07/02/19 5:29 PM

## 2019-07-02 NOTE — Anesthesia Procedure Notes (Addendum)
Arterial Line Insertion Start/End4/20/2021 10:52 AM, 07/02/2019 11:03 AM Performed by: Oleta Mouse, MD, Jenne Campus, CRNA, CRNA  Patient location: Pre-op. Preanesthetic checklist: patient identified, IV checked, risks and benefits discussed, surgical consent, monitors and equipment checked and pre-op evaluation Lidocaine 1% used for infiltration radial was placed Catheter size: 20 G Hand hygiene performed  and maximum sterile barriers used   Attempts: 2 Procedure performed without using ultrasound guided technique. Ultrasound Notes:anatomy identified, needle tip was noted to be adjacent to the nerve/plexus identified and no ultrasound evidence of intravascular and/or intraneural injection Following insertion, dressing applied and Biopatch. Post procedure assessment: normal  Patient tolerated the procedure well with no immediate complications. Additional procedure comments: Beverlyn Roux SRNA performed.

## 2019-07-02 NOTE — Progress Notes (Signed)
Neurosurgery Service Post-operative progress note  Assessment & Plan: 24 y.o. man s/p R occipital crani for tumor, seen in PACU, FCx4, speech fluent with normal content, recovering well.  -admit to 4N -SBP<160 -can d/c steroids now that tumor is debulked -MRI brain w/wo contrast tomorrow -SCDs/TEDs -PT/OT tomorrow  Judith Part  07/02/19 5:55 PM

## 2019-07-02 NOTE — Transfer of Care (Signed)
Immediate Anesthesia Transfer of Care Note  Patient: Stephen Beard  Procedure(s) Performed: CRANIOTOMY TUMOR EXCISION with BRAINLAB (Right Head) APPLICATION OF CRANIAL NAVIGATION (N/A )  Patient Location: PACU  Anesthesia Type:General  Level of Consciousness: awake and alert   Airway & Oxygen Therapy: Patient Spontanous Breathing and Patient connected to nasal cannula oxygen  Post-op Assessment: Report given to RN and Post -op Vital signs reviewed and stable  Post vital signs: Reviewed and stable  Last Vitals:  Vitals Value Taken Time  BP 113/57 07/02/19 1736  Temp    Pulse 104 07/02/19 1740  Resp 13 07/02/19 1740  SpO2 99 % 07/02/19 1740  Vitals shown include unvalidated device data.  Last Pain:  Vitals:   07/02/19 0949  TempSrc:   PainSc: 4          Complications: No apparent anesthesia complications

## 2019-07-02 NOTE — Anesthesia Procedure Notes (Addendum)
Procedure Name: Intubation Date/Time: 07/02/2019 11:33 AM Performed by: Jenne Campus, CRNA Pre-anesthesia Checklist: Patient identified, Emergency Drugs available, Suction available and Patient being monitored Patient Re-evaluated:Patient Re-evaluated prior to induction Oxygen Delivery Method: Circle System Utilized Preoxygenation: Pre-oxygenation with 100% oxygen Induction Type: IV induction Ventilation: Mask ventilation without difficulty Laryngoscope Size: Mac and 4 Grade View: Grade I Tube type: Oral Tube size: 7.5 mm Number of attempts: 1 Airway Equipment and Method: Stylet and Oral airway Placement Confirmation: ETT inserted through vocal cords under direct vision,  positive ETCO2 and breath sounds checked- equal and bilateral Secured at: 23 cm Tube secured with: Tape Dental Injury: Teeth and Oropharynx as per pre-operative assessment  Comments: Potter performed

## 2019-07-02 NOTE — Progress Notes (Signed)
Neurosurgery Service Progress Note  Subjective: No acute events overnight, no new complaints today   Objective: Vitals:   07/02/19 0600 07/02/19 0725 07/02/19 0800 07/02/19 0949  BP: (!) 104/55  126/89 124/79  Pulse: (!) 51  (!) 57 66  Resp: 15  18 16   Temp:      TempSrc:      SpO2: 95%  94% 95%  Weight:  59 kg    Height:  5\' 7"  (1.702 m)     No data recorded.  CBC Latest Ref Rng & Units 06/30/2019 02/08/2019  WBC 4.0 - 10.5 K/uL 8.4 8.4  Hemoglobin 13.0 - 17.0 g/dL 16.4 16.4  Hematocrit 39.0 - 52.0 % 46.2 47.7  Platelets 150 - 400 K/uL 228 214   BMP Latest Ref Rng & Units 06/30/2019 02/08/2019  Glucose 70 - 99 mg/dL 102(H) 106(H)  BUN 6 - 20 mg/dL 11 15  Creatinine 0.61 - 1.24 mg/dL 1.07 0.98  Sodium 135 - 145 mmol/L 138 139  Potassium 3.5 - 5.1 mmol/L 4.0 3.9  Chloride 98 - 111 mmol/L 104 104  CO2 22 - 32 mmol/L 22 25  Calcium 8.9 - 10.3 mg/dL 9.3 9.9   No intake or output data in the 24 hours ending 07/02/19 1115  Current Facility-Administered Medications:  .  0.9 %  sodium chloride infusion, , Intravenous, Continuous, Meyran, [name], NP, Last Rate: 50 mL/hr at 07/02/19 0633, New Bag at 07/02/19 [phone] .  0.9 %  sodium chloride infusion, , Intravenous, Continuous, Oleta Mouse, MD .  Doug Sou Hold] acetaminophen (TYLENOL) tablet 650 mg, 650 mg, Oral, Q6H PRN **OR** [MAR Hold] acetaminophen (TYLENOL) suppository 650 mg, 650 mg, Rectal, Q6H PRN, Meyran, [name], NP .  Doug Sou Hold] dexamethasone (DECADRON) injection 8 mg, 8 mg, Intravenous, Q6H, Meyran, [name], NP, 8 mg at 07/02/19 0513 .  [MAR Hold] HYDROcodone-acetaminophen (NORCO/VICODIN) 5-325 MG per tablet 1-2 tablet, 1-2 tablet, Oral, Q4H PRN, Meyran, [name], NP, 1 tablet at 07/01/19 1830 .  [MAR Hold] nicotine (NICODERM CQ - dosed in mg/24 hours) patch 21 mg, 21 mg, Transdermal, Daily, Kary Kos, MD, 21 mg at 07/01/19 1029 .  [MAR Hold] ondansetron (ZOFRAN) tablet 4 mg, 4 mg, Oral,  Q6H PRN **OR** [MAR Hold] ondansetron (ZOFRAN) injection 4 mg, 4 mg, Intravenous, Q6H PRN, Meyran, [name], NP .  Doug Sou Hold] remifentanil (ULTIVA) 5000 mcg in 250 mL normal saline (20 mcg/mL) for ICU use, 0.05 mcg/kg/min, Intravenous, To OR, Zephaniah Enyeart, Joyice Faster, MD .  Doug Sou Hold] senna-docusate (Senokot-S) tablet 1 tablet, 1 tablet, Oral, QHS PRN, Meyran, [name], NP   Physical Exam: AOx3, PERRL, +left homonymous hemianopsia, some occasional confusion / inappropriate speech, EOMI but c/o diplopia in all directions, FS, Strength 5/5 x4, SILTx4, +mild LUE drift  Assessment & Plan: 24 y.o. 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  -OR today for resection  Judith Part  07/02/19 11:15 AM

## 2019-07-02 NOTE — Anesthesia Preprocedure Evaluation (Addendum)
Anesthesia Evaluation  Patient identified by MRN, date of birth, ID band Patient awake    Reviewed: Allergy & Precautions, NPO status , Patient's Chart, lab work & pertinent test results  History of Anesthesia Complications Negative for: history of anesthetic complications  Airway Mallampati: II  TM Distance: >3 FB Neck ROM: Full    Dental  (+) Teeth Intact, Dental Advisory Given,    Pulmonary neg recent URI, Current Smoker and Patient abstained from smoking.,    breath sounds clear to auscultation       Cardiovascular negative cardio ROS   Rhythm:Regular     Neuro/Psych ADHDRight brain mass.    GI/Hepatic negative GI ROS, Neg liver ROS,   Endo/Other  negative endocrine ROS  Renal/GU negative Renal ROS  negative genitourinary   Musculoskeletal negative musculoskeletal ROS (+)   Abdominal   Peds negative pediatric ROS (+)  Hematology negative hematology ROS (+)   Anesthesia Other Findings   Reproductive/Obstetrics negative OB ROS                            Anesthesia Physical Anesthesia Plan  ASA: II  Anesthesia Plan: General   Post-op Pain Management:    Induction: Intravenous  PONV Risk Score and Plan: 1 and Ondansetron, Dexamethasone and Propofol infusion  Airway Management Planned: Oral ETT  Additional Equipment: Arterial line  Intra-op Plan:   Post-operative Plan: Extubation in OR  Informed Consent: I have reviewed the patients History and Physical, chart, labs and discussed the procedure including the risks, benefits and alternatives for the proposed anesthesia with the patient or authorized representative who has indicated his/her understanding and acceptance.     Dental advisory given  Plan Discussed with: CRNA  Anesthesia Plan Comments:         Anesthesia Quick Evaluation

## 2019-07-02 NOTE — Brief Op Note (Signed)
06/30/2019 - 07/02/2019  5:24 PM  PATIENT:  Stephen Beard  24 y.o. 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   Delay start of Pharmacological VTE agent (>24hrs) due to surgical blood loss or risk of bleeding: yes

## 2019-07-03 ENCOUNTER — Encounter (HOSPITAL_COMMUNITY)
Admission: EM | Disposition: A | Discharge status: Home / Self Care | Payer: Self-pay | Source: Home / Self Care | Attending: Neurological Surgery

## 2019-07-03 ENCOUNTER — Inpatient Hospital Stay (HOSPITAL_COMMUNITY): Payer: Medicaid Other

## 2019-07-03 ENCOUNTER — Inpatient Hospital Stay (HOSPITAL_COMMUNITY): Payer: Medicaid Other | Admitting: Certified Registered Nurse Anesthetist

## 2019-07-03 ENCOUNTER — Encounter: Payer: Self-pay | Admitting: *Deleted

## 2019-07-03 DIAGNOSIS — R0902 Hypoxemia: Secondary | ICD-10-CM

## 2019-07-03 HISTORY — PX: CRANIOTOMY: SHX93

## 2019-07-03 LAB — CBC
HCT: 30.7 % — ABNORMAL LOW (ref 39.0–52.0)
Hemoglobin: 10.5 g/dL — ABNORMAL LOW (ref 13.0–17.0)
MCH: 31.8 pg (ref 26.0–34.0)
MCHC: 34.2 g/dL (ref 30.0–36.0)
MCV: 93 fL (ref 80.0–100.0)
Platelets: 188 10*3/uL (ref 150–400)
RBC: 3.3 MIL/uL — ABNORMAL LOW (ref 4.22–5.81)
RDW: 12.9 % (ref 11.5–15.5)
WBC: 20.9 10*3/uL — ABNORMAL HIGH (ref 4.0–10.5)
nRBC: 0 % (ref 0.0–0.2)

## 2019-07-03 LAB — APTT: aPTT: 26 seconds (ref 24–36)

## 2019-07-03 LAB — SODIUM: Sodium: 139 mmol/L (ref 135–145)

## 2019-07-03 LAB — POCT I-STAT 7, (LYTES, BLD GAS, ICA,H+H)
Acid-base deficit: 6 mmol/L — ABNORMAL HIGH (ref 0.0–2.0)
Bicarbonate: 21.1 mmol/L (ref 20.0–28.0)
Calcium, Ion: 1.15 mmol/L (ref 1.15–1.40)
HCT: 28 % — ABNORMAL LOW (ref 39.0–52.0)
Hemoglobin: 9.5 g/dL — ABNORMAL LOW (ref 13.0–17.0)
O2 Saturation: 91 %
Patient temperature: 98.2
Potassium: 3.7 mmol/L (ref 3.5–5.1)
Sodium: 141 mmol/L (ref 135–145)
TCO2: 22 mmol/L (ref 22–32)
pCO2 arterial: 45.4 mmHg (ref 32.0–48.0)
pH, Arterial: 7.274 — ABNORMAL LOW (ref 7.350–7.450)
pO2, Arterial: 69 mmHg — ABNORMAL LOW (ref 83.0–108.0)

## 2019-07-03 LAB — RENAL FUNCTION PANEL
Albumin: 4 g/dL (ref 3.5–5.0)
Anion gap: 11 (ref 5–15)
BUN: 7 mg/dL (ref 6–20)
CO2: 24 mmol/L (ref 22–32)
Calcium: 8.5 mg/dL — ABNORMAL LOW (ref 8.9–10.3)
Chloride: 101 mmol/L (ref 98–111)
Creatinine, Ser: 0.61 mg/dL (ref 0.61–1.24)
GFR calc Af Amer: >60 mL/min (ref >60)
GFR calc non Af Amer: >60 mL/min (ref >60)
Glucose, Bld: 147 mg/dL — ABNORMAL HIGH (ref 70–99)
Phosphorus: 3.1 mg/dL (ref 2.5–4.6)
Potassium: 3.5 mmol/L (ref 3.5–5.1)
Sodium: 136 mmol/L (ref 135–145)

## 2019-07-03 LAB — PREPARE RBC (CROSSMATCH)

## 2019-07-03 LAB — PROTIME-INR
INR: 1.1 (ref 0.8–1.2)
Prothrombin Time: 13.7 seconds (ref 11.4–15.2)

## 2019-07-03 LAB — TRIGLYCERIDES: Triglycerides: 49 mg/dL (ref <150)

## 2019-07-03 LAB — HEMOGLOBIN AND HEMATOCRIT, BLOOD
HCT: 29.3 % — ABNORMAL LOW (ref 39.0–52.0)
Hemoglobin: 9.9 g/dL — ABNORMAL LOW (ref 13.0–17.0)

## 2019-07-03 IMAGING — CT CT HEAD W/O CM
4 series · 16 of 47 positions shown, 18 images · non-contrast
Comparison: Head MRI [DATE]

CLINICAL DATA: Seizure. Abnormal neurological examination following
surgery. Status post tumor resection yesterday and subsequent
postoperative hematoma evacuation today.

EXAM:
CT HEAD WITHOUT CONTRAST
TECHNIQUE: Contiguous axial images were obtained from the base of the skull
through the vertex without intravenous contrast.

[Series 2: head wo · axial · 0.52mm/px · z∈[+1052,+1182]mm · 7 of 36 slices shown, 9 images]
[im 5/36  brain]
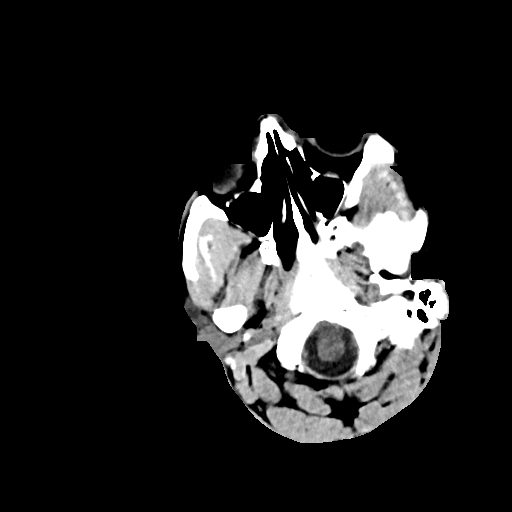
[im 5/36  bone]
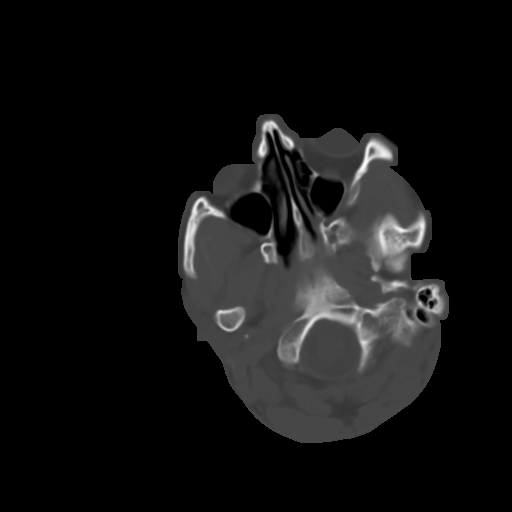
[im 9/36  brain]
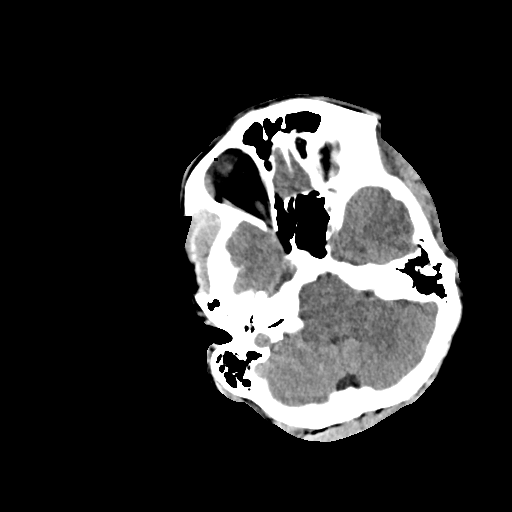
[im 14/36  brain]
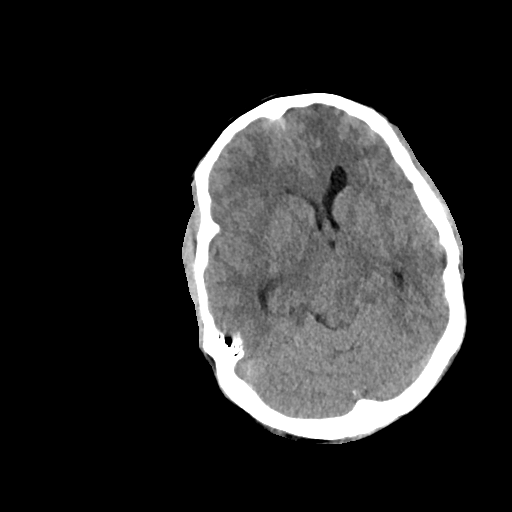
[im 18/36  brain]
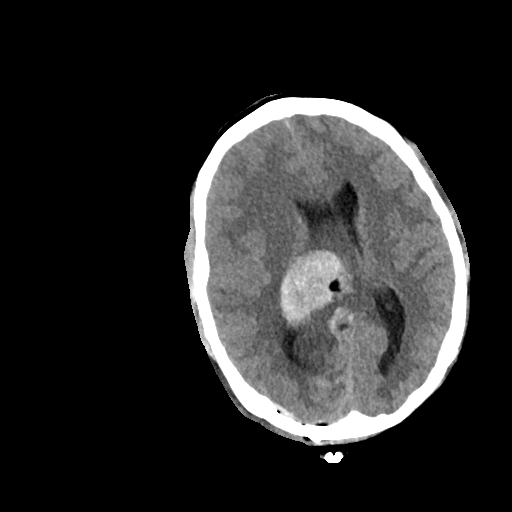
[im 22/36  brain]
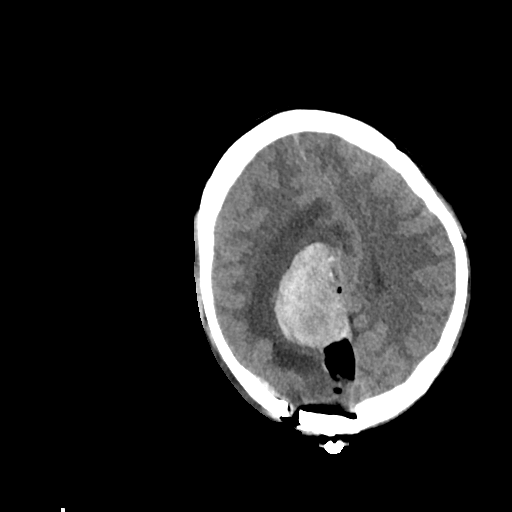
[im 22/36  bone]
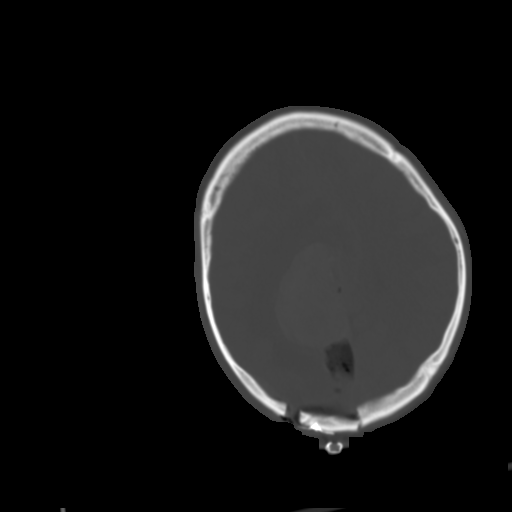
[im 27/36  brain]
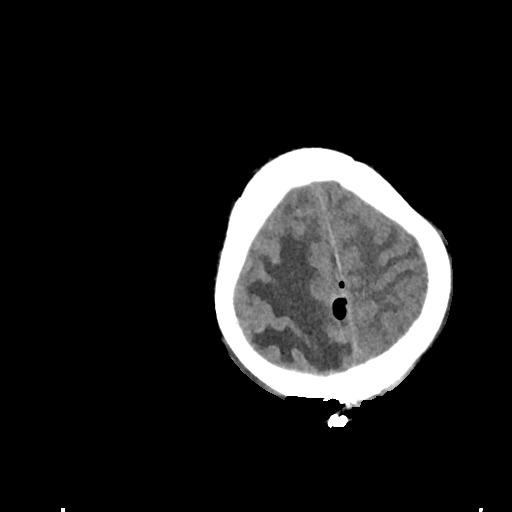
[im 31/36  brain]
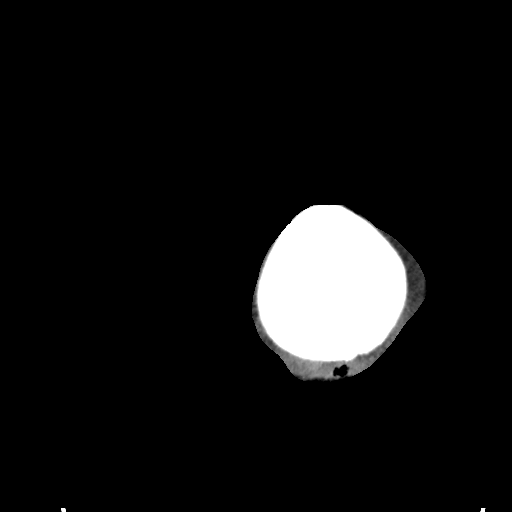

[Series 3: head bone · axial · 0.52mm/px · z∈[+1048,+1084]mm · 3 of 90 slices shown]
[im 9/90  bone]
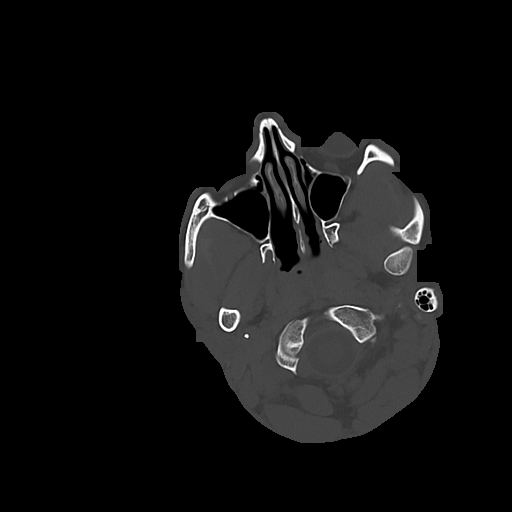
[im 18/90  bone]
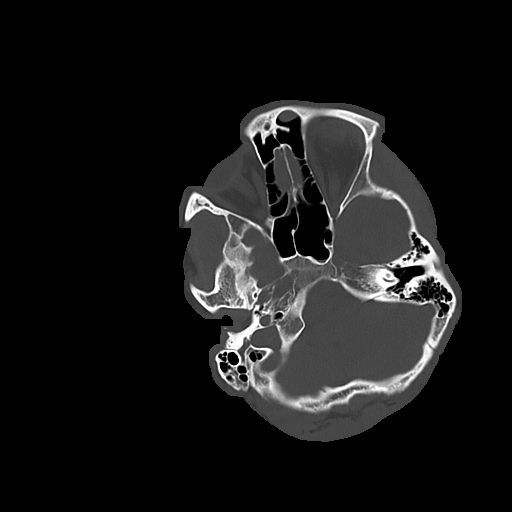
[im 27/90  bone]
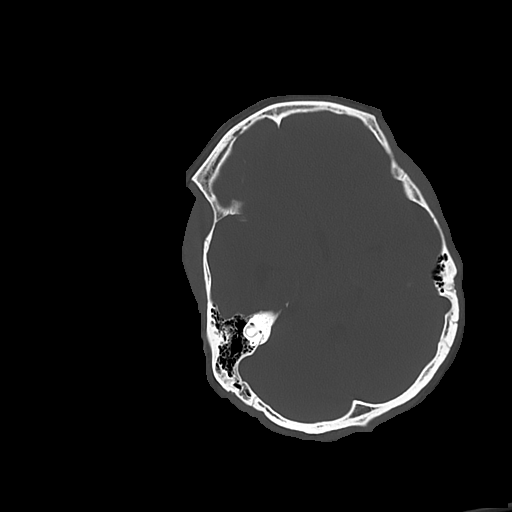

[Series 4: cor soft · coronal · 0.35mm/px · 3 of 74 slices shown]
[im 25/74  brain]
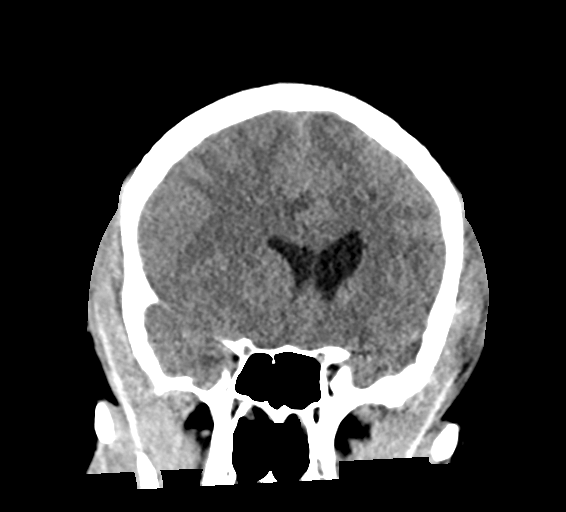
[im 33/74  brain]
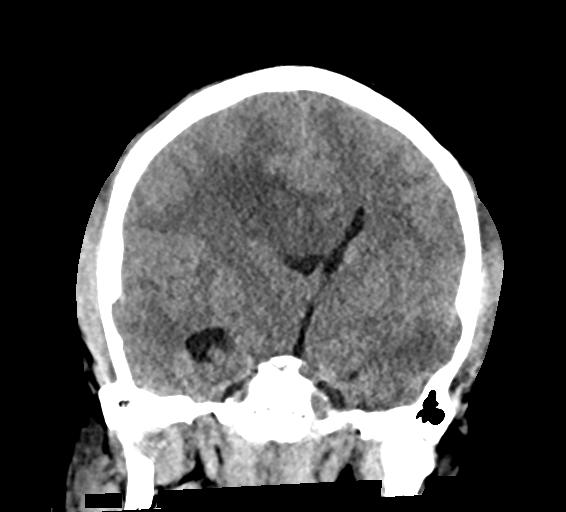
[im 41/74  brain]
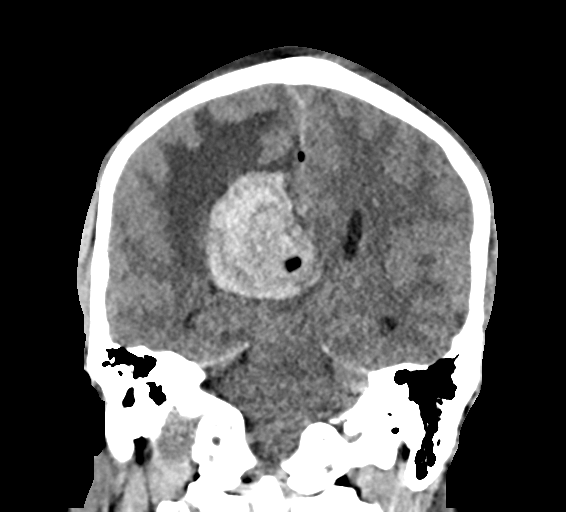

[Series 5: sag soft · sagittal · 0.38mm/px · 3 of 63 slices shown]
[im 21/63  brain]
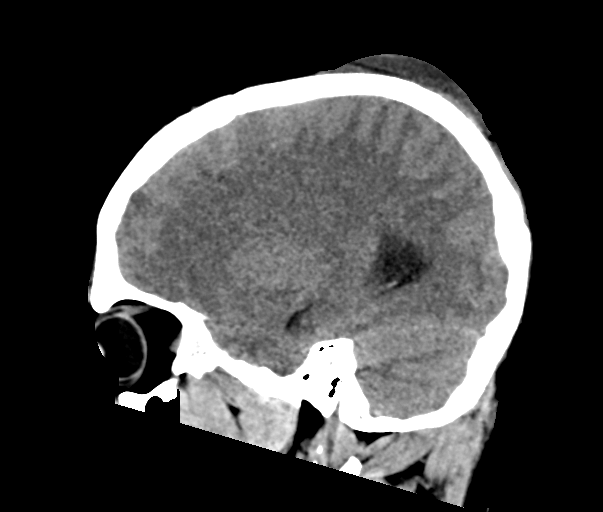
[im 32/63  brain]
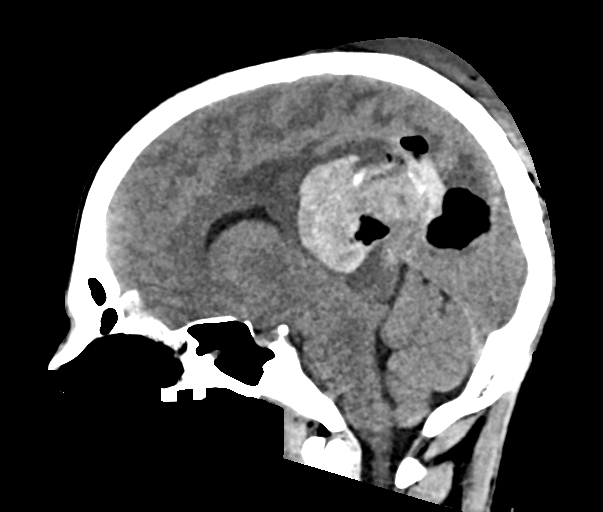
[im 42/63  brain]
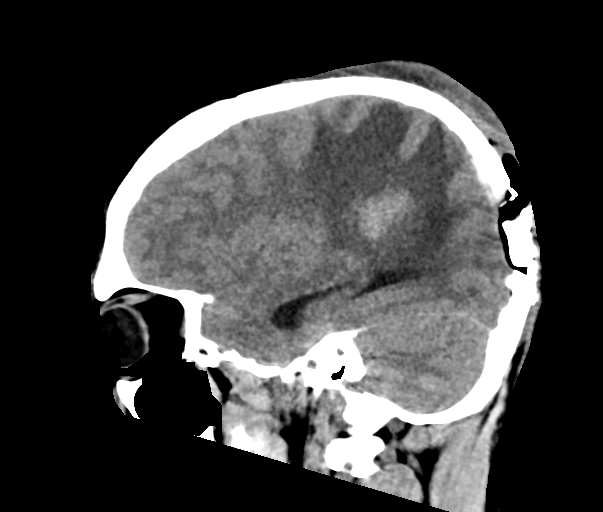

[16 of 47 positions shown; findings below may reference images not displayed]

FINDINGS: Brain: Postoperative changes are again seen from right
parieto-occipital craniotomy. A hyperattenuating hematoma in the
medial right parietal operative bed measures 5.3 x 3.6 x 4.3 cm (AP
x transverse x craniocaudal), mildly smaller than on today's MRI.
Extensive edema in the posterior right cerebrum is similar to the
prior MRI, as is leftward midline shift which measures 17 mm.
Postoperative gas is noted along the operative tract. The ventricles
are unchanged in size with mild asymmetric dilatation of the right
temporal horn again noted. No sizable extra-axial fluid collection
is present. There remains partial effacement of the basilar cisterns
including the suprasellar cistern. There is no cerebellar tonsillar
herniation.

Vascular: No hyperdense vessel.

Skull: Right parieto-occipital craniotomy with small volume fluid
and gas in the scalp soft tissues and with skin staples in place.

Sinuses/Orbits: Unremarkable orbits. Minimal mucosal thickening in
the paranasal sinuses. Clear mastoid air cells.

Other: None.
IMPRESSION: Postoperative changes with residual hematoma in the right parietal
operative bed, mildly smaller than on today's earlier MRI. Similar
degree of edema and mass effect with 17 mm of midline shift.

## 2019-07-03 IMAGING — CT CT HEAD W/O CM
4 series · 16 of 47 positions shown, 18 images · non-contrast
Comparison: [DATE] at [DATE] p.m.

CLINICAL DATA: Postop hematoma evacuation.

EXAM:
CT HEAD WITHOUT CONTRAST
TECHNIQUE: Contiguous axial images were obtained from the base of the skull
through the vertex without intravenous contrast.

[Series 3: head without · axial · non-contrast · 0.46mm/px · z∈[-137,+8]mm · 7 of 39 slices shown, 9 images]
[im 5/39  brain]
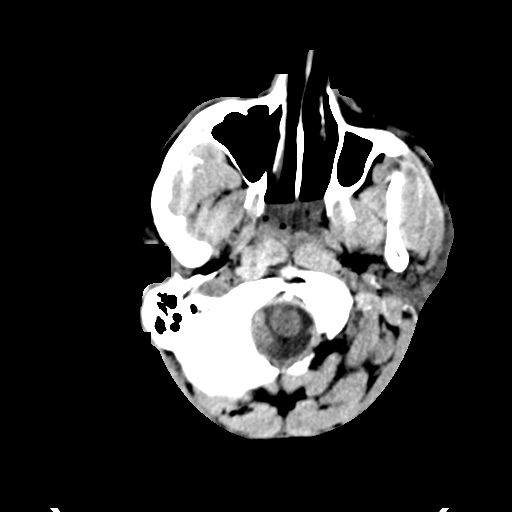
[im 5/39  bone]
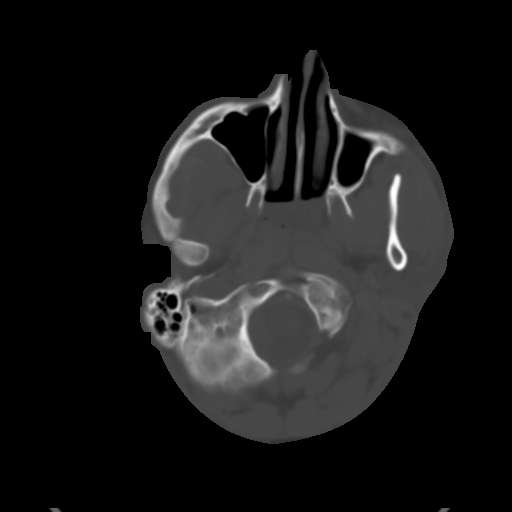
[im 10/39  brain]
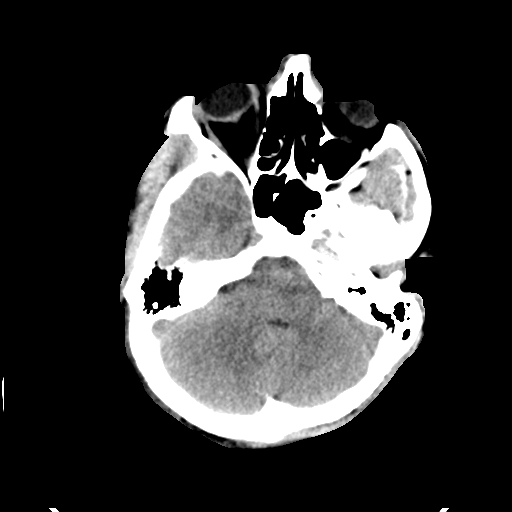
[im 15/39  brain]
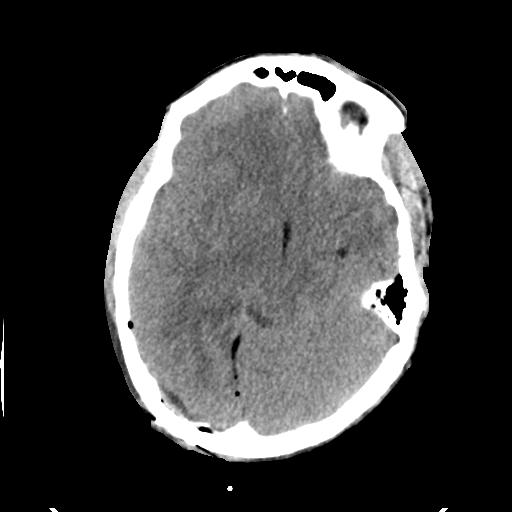
[im 20/39  brain]
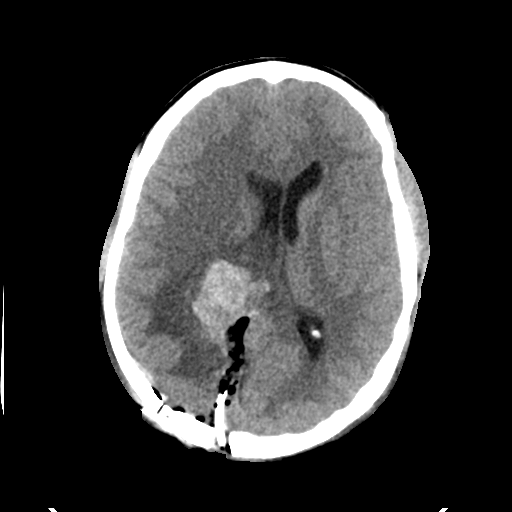
[im 24/39  brain]
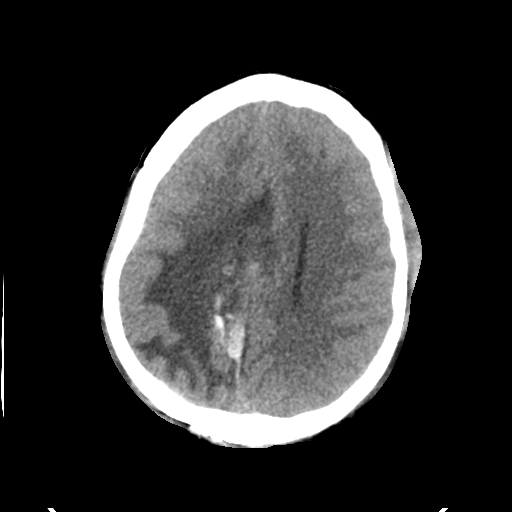
[im 24/39  bone]
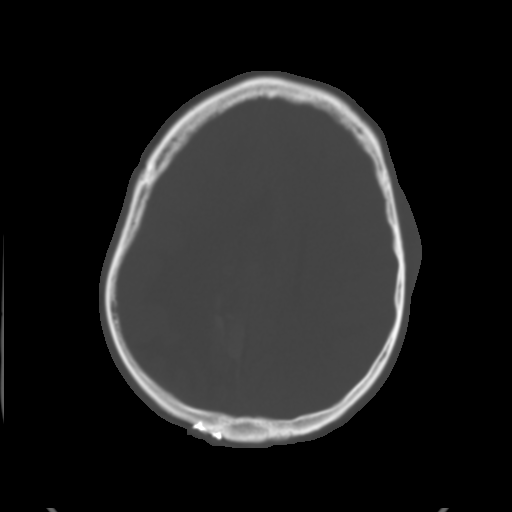
[im 29/39  brain]
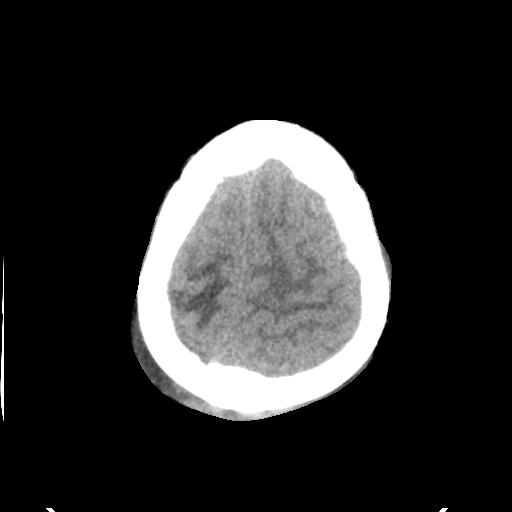
[im 34/39  brain]
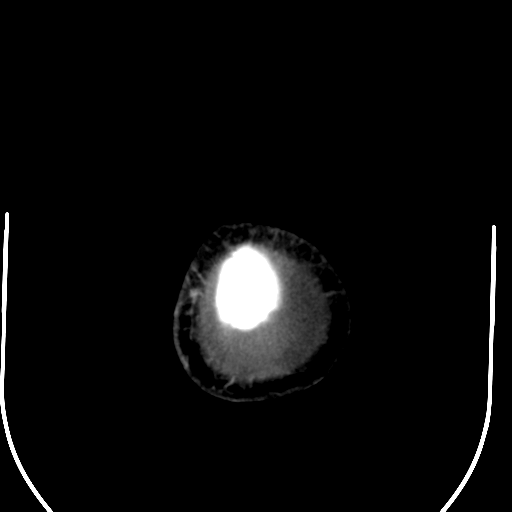

[Series 4: head bone · axial · 0.46mm/px · z∈[-139,-101]mm · 3 of 96 slices shown]
[im 10/96  bone]
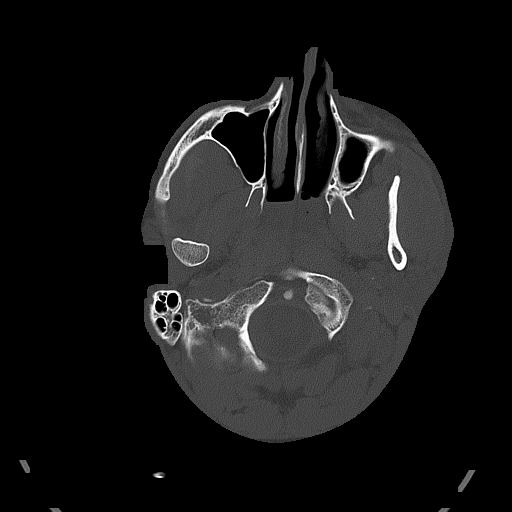
[im 20/96  bone]
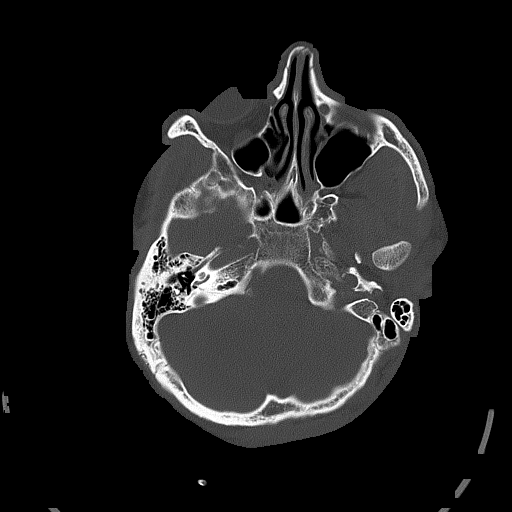
[im 29/96  bone]
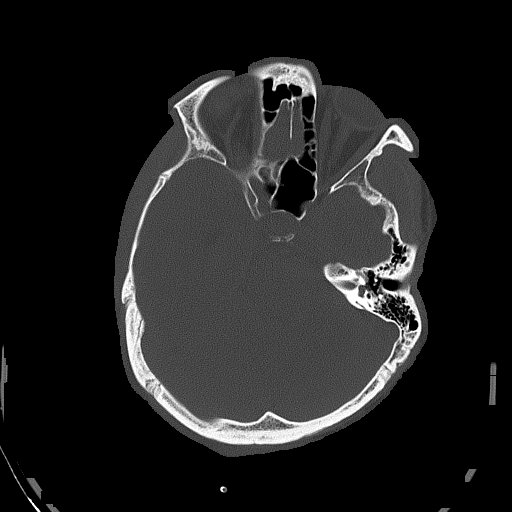

[Series 5: head without cor · coronal · non-contrast · 0.37mm/px · 3 of 74 slices shown]
[im 25/74  brain]
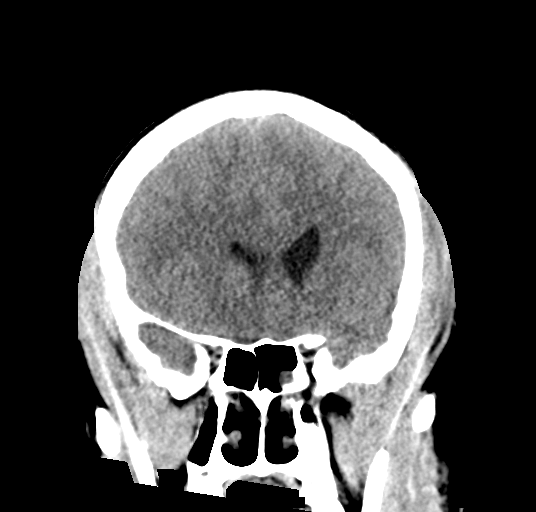
[im 33/74  brain]
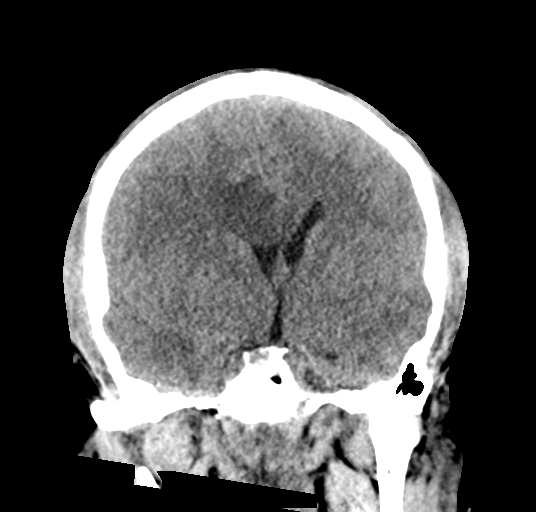
[im 41/74  brain]
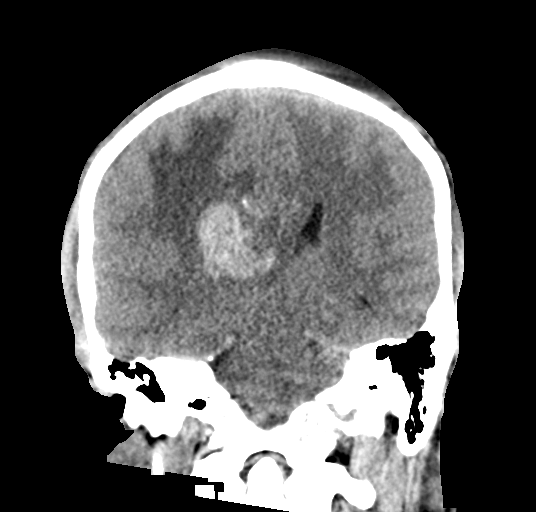

[Series 6: head without sag · sagittal · non-contrast · 0.37mm/px · 3 of 67 slices shown]
[im 24/67  brain]
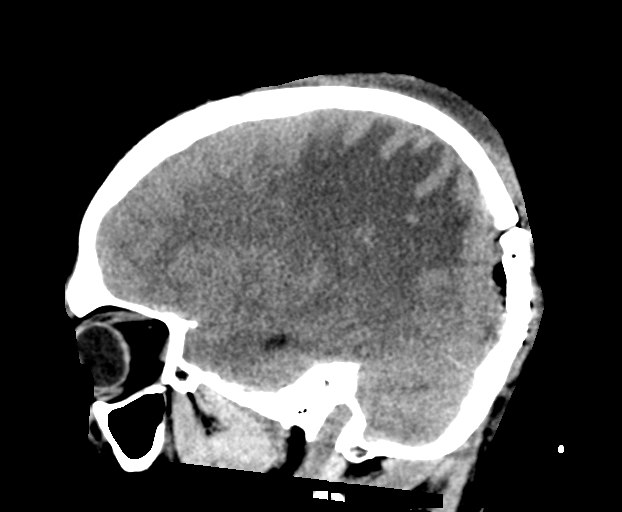
[im 34/67  brain]
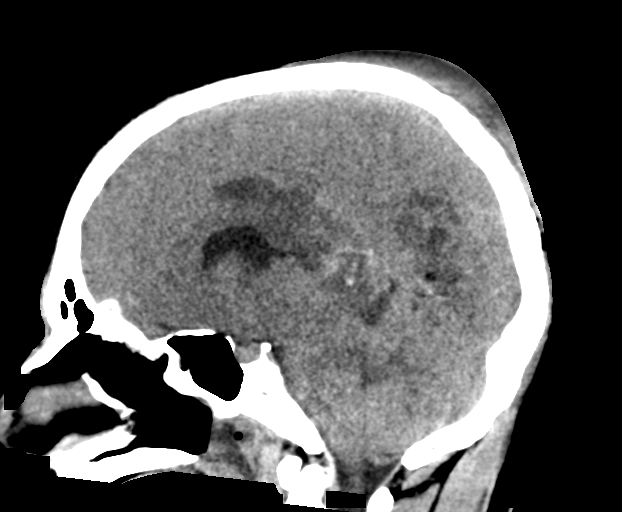
[im 43/67  brain]
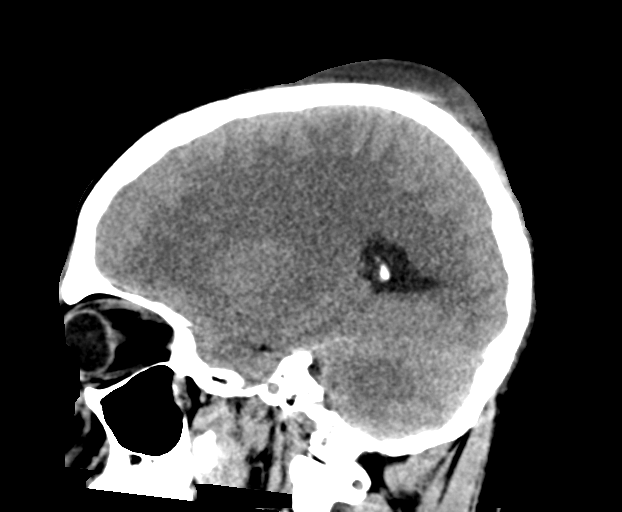

[16 of 47 positions shown; findings below may reference images not displayed]

FINDINGS: Brain: Sequelae of right parieto-occipital craniotomy for tumor
resection are again identified. A hematoma in the medial right
parietal operative bed is smaller following interval evacuation, now
measuring 4.2 x 3.0 x 2.8 cm (AP x transverse x craniocaudal). Gas
is noted along the operative tract in the medial right
parieto-occipital region, and there is a new drain in place in this
location. There is persistent extensive vasogenic edema in the
posterior right cerebral hemisphere. Mass effect from the hematoma
has decreased with leftward midline shift now measuring 12 mm
(previously 17 mm). The lateral ventricles are slightly smaller than
on the prior study. There is a small amount of extra-axial blood
along the falx in the right parietal region. There is also minimal
extra-axial blood over the posterior right cerebral convexity
underneath the craniotomy. There is persistent partial effacement of
the basilar cisterns. There is no cerebellar tonsillar herniation.
No sizable acute cortically based infarct is identified.

Vascular: No hyperdense vessel.

Skull: Right parieto-occipital craniotomy with small volume fluid
and gas in the scalp.

Sinuses/Orbits: Unremarkable orbits. Minimal mucosal thickening in
the paranasal sinuses. Clear mastoid air cells.

Other: None.
IMPRESSION: Decreased size of right parietal hematoma status post evacuation and
drain placement. Persistent extensive vasogenic edema in the
posterior right cerebral hemisphere with decreased leftward midline
shift, now 12 mm.

## 2019-07-03 IMAGING — MR MR HEAD WO/W CM
7 of 14 series · 20 of 48 positions shown · IV contrast (Yes MH)
Comparison: CT and MRI studies of [DATE] and [DATE].

CLINICAL DATA: Follow-up craniotomy. High-grade glioma by frozen
section.

EXAM:
MRI HEAD WITHOUT AND WITH CONTRAST
TECHNIQUE: Multiplanar, multiecho pulse sequences of the brain and surrounding
structures were obtained without and with intravenous contrast.
CONTRAST:  7mL GADAVIST GADOBUTROL 1 MMOL/ML IV SOLN

[Series 2: DWI · axial · 3.0mm · 0.94mm/px · z∈[-126,+19]mm · 5 of 100 slices shown (1 of 2)]
[im 1/100]
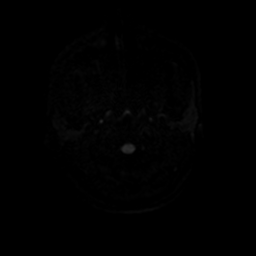
[im 25/100]
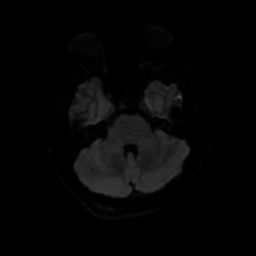
[im 50/100]
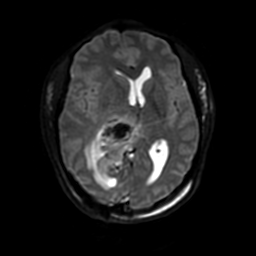
[im 75/100]
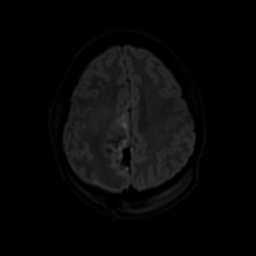
[im 100/100]
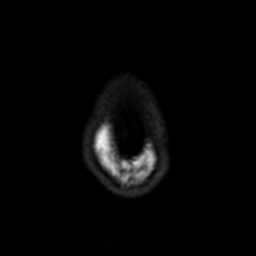

[Series 3: DWI · coronal · 4.0mm · 0.94mm/px · 4 of 74 slices shown (2 of 2)]
[im 1/74]
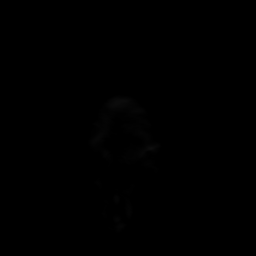
[im 25/74]
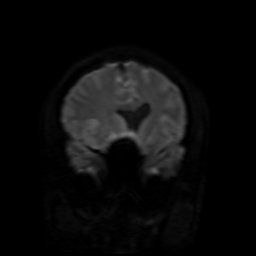
[im 49/74]
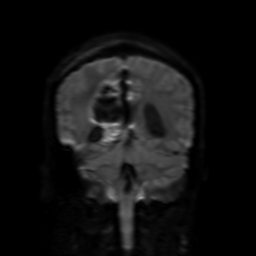
[im 74/74]
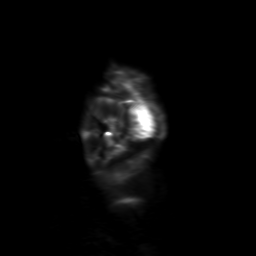

[Series 4: FLAIR · sagittal · 5.0mm · 0.23mm/px · 2 of 29 slices shown (1 of 2)]
[im 1/29]
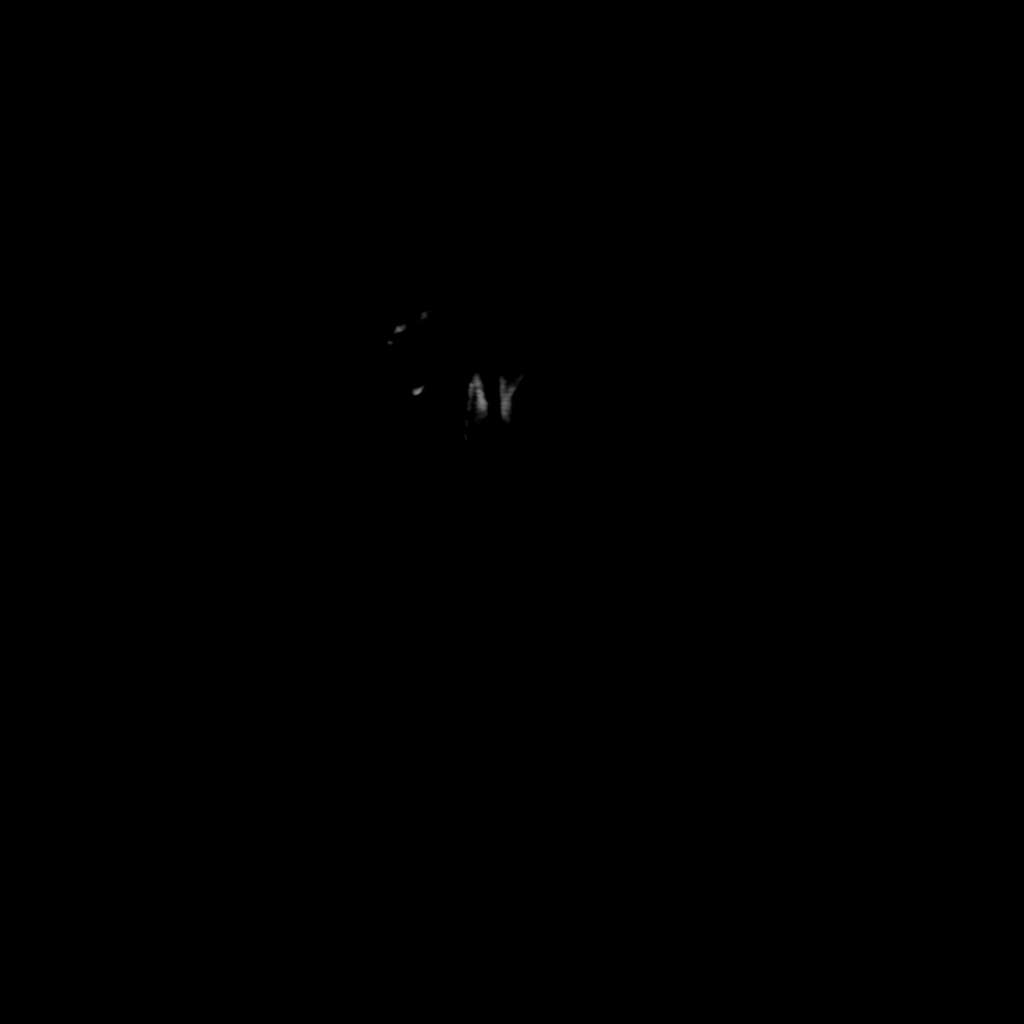
[im 29/29]
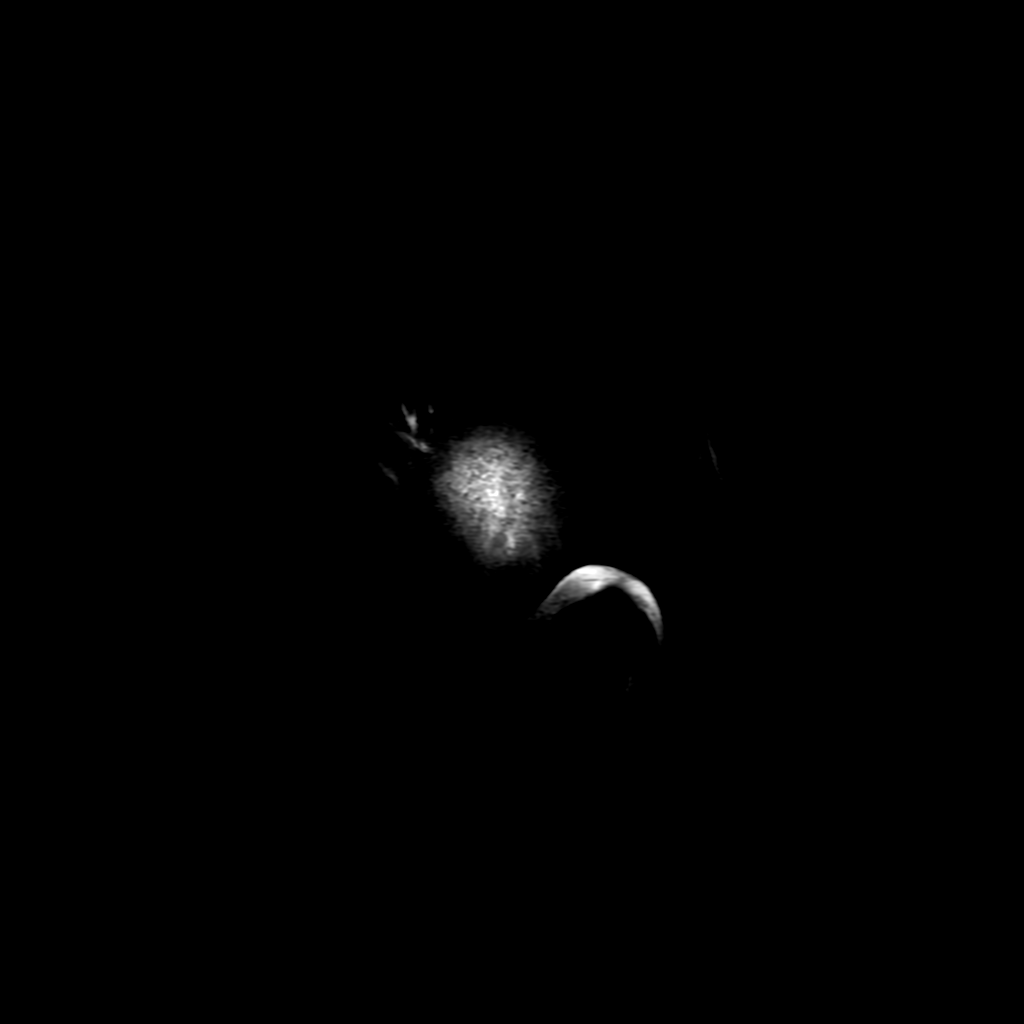

[Series 6: FLAIR · axial · 3.0mm · 0.41mm/px · z∈[-124,+23]mm · 2 of 26 slices shown (2 of 2)]
[im 1/26]
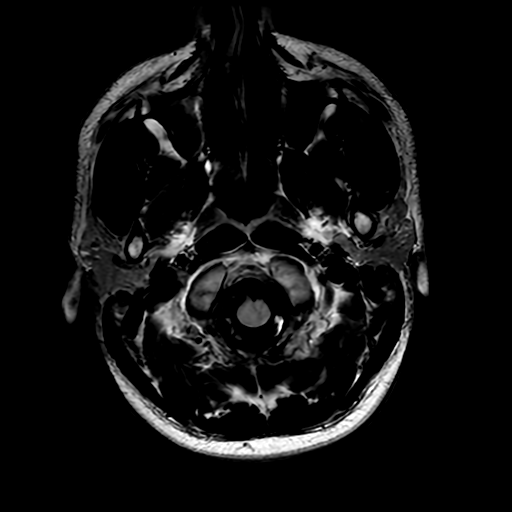
[im 26/26]
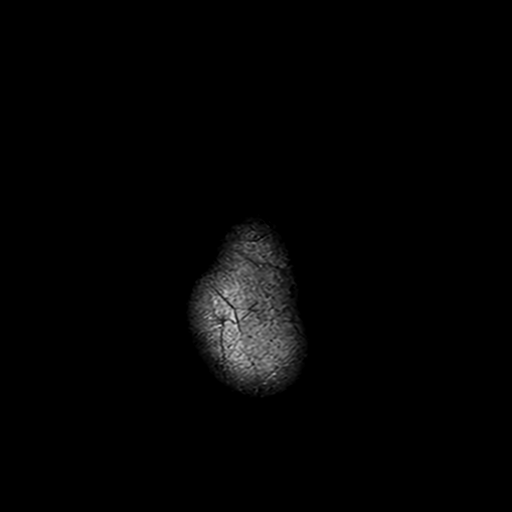

[Series 12: FLAIR post-contrast · sagittal · 5.0mm · 0.47mm/px · 2 of 29 slices shown]
[im 1/29]
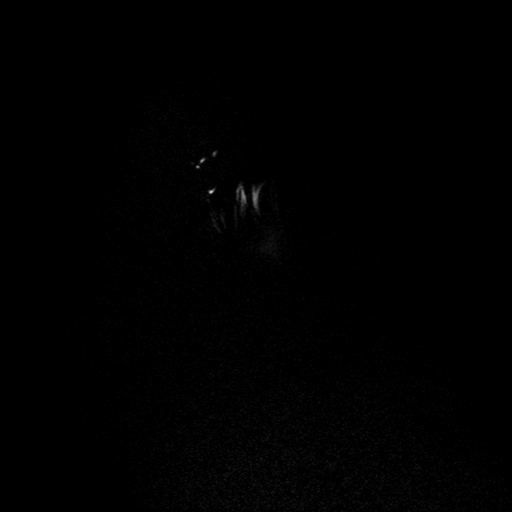
[im 29/29]
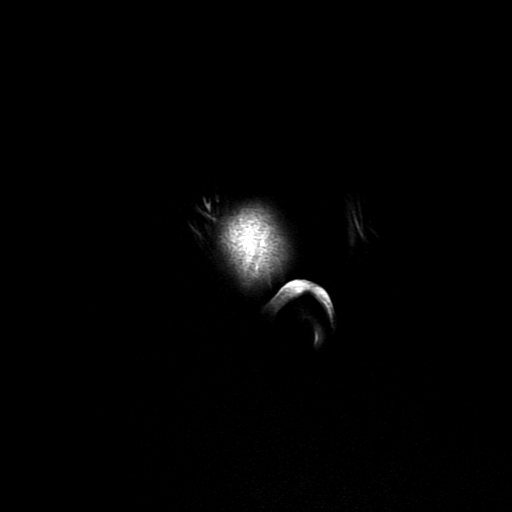

[Series 250: ADC · axial · 3.0mm · 0.94mm/px · z∈[-126,+19]mm · 3 of 50 slices shown (1 of 2)]
[im 1/50]
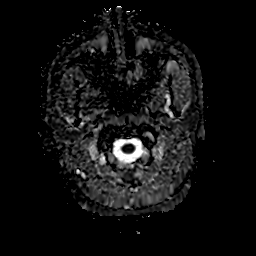
[im 25/50]
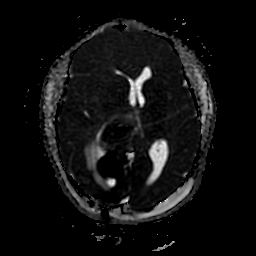
[im 50/50]
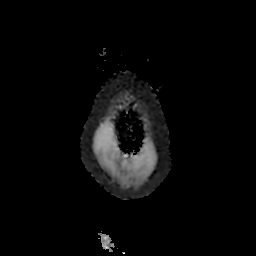

[Series 350: ADC · coronal · 4.0mm · 0.94mm/px · 2 of 37 slices shown (2 of 2)]
[im 1/37]
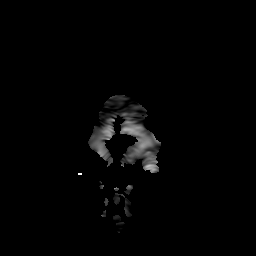
[im 37/37]
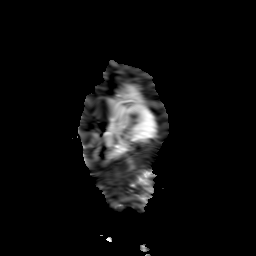

[20 of 48 positions shown; findings below may reference images not displayed]

FINDINGS: Brain: Interval right parieto-occipital craniotomy for debulking of
the large malignant-appearing mass with the epicenter in the deep
right medial parietal lobe. The vast majority of the enhancing tumor
has been resected. Residual material in the postoperative space
could be a combination of residual nonenhancing tumor and packing
material. There does appear to be some indistinct contrast
enhancement particularly towards the inferior margin suggesting that
some of this represents residual tumor. Surrounding vasogenic white
matter edema appears quite similar. Sub falcine herniation appears
similar, though right-to-left midline shift is decreased from 17 mm
15 or 16 mm. Slight increase in size of the occipital horn and
temporal horn of the right lateral ventricle could be due 2
diminished mass effect from the tumor debulking as well as some
potential for localized trapping. No unexpected subdural collection
or hemorrhagic complication.

Vascular: Major vessels at the base of the brain show flow.

Skull and upper cervical spine: Craniotomy changes as above.

Sinuses/Orbits: Clear sinuses. Flattening of the posterior globes
possibly related to chronic increased intracranial pressure

Other: None
IMPRESSION: Recent right parieto-occipital craniotomy for tumor debulking. The
vast majority of the enhancing tumor has been resected. Residual
material in the operative space could be a combination of poorly
enhancing tumor, blood products and packing material. Vasogenic
edema appears similar. Mass effect with sub falcine herniation
appears similar. Slight increased prominence of the right occipital
horn and temporal horn could be due to diminished mass-effect upon
those structures or could possibly represent an element of trapping.
The changes minor.

## 2019-07-03 IMAGING — DX DG CHEST 1V PORT
2 series · 2 of 2 positions shown · non-contrast
Comparison: None.

CLINICAL DATA: Intubated, central line placement

EXAM:
PORTABLE CHEST 1 VIEW

[chest ap (1 of 2)]
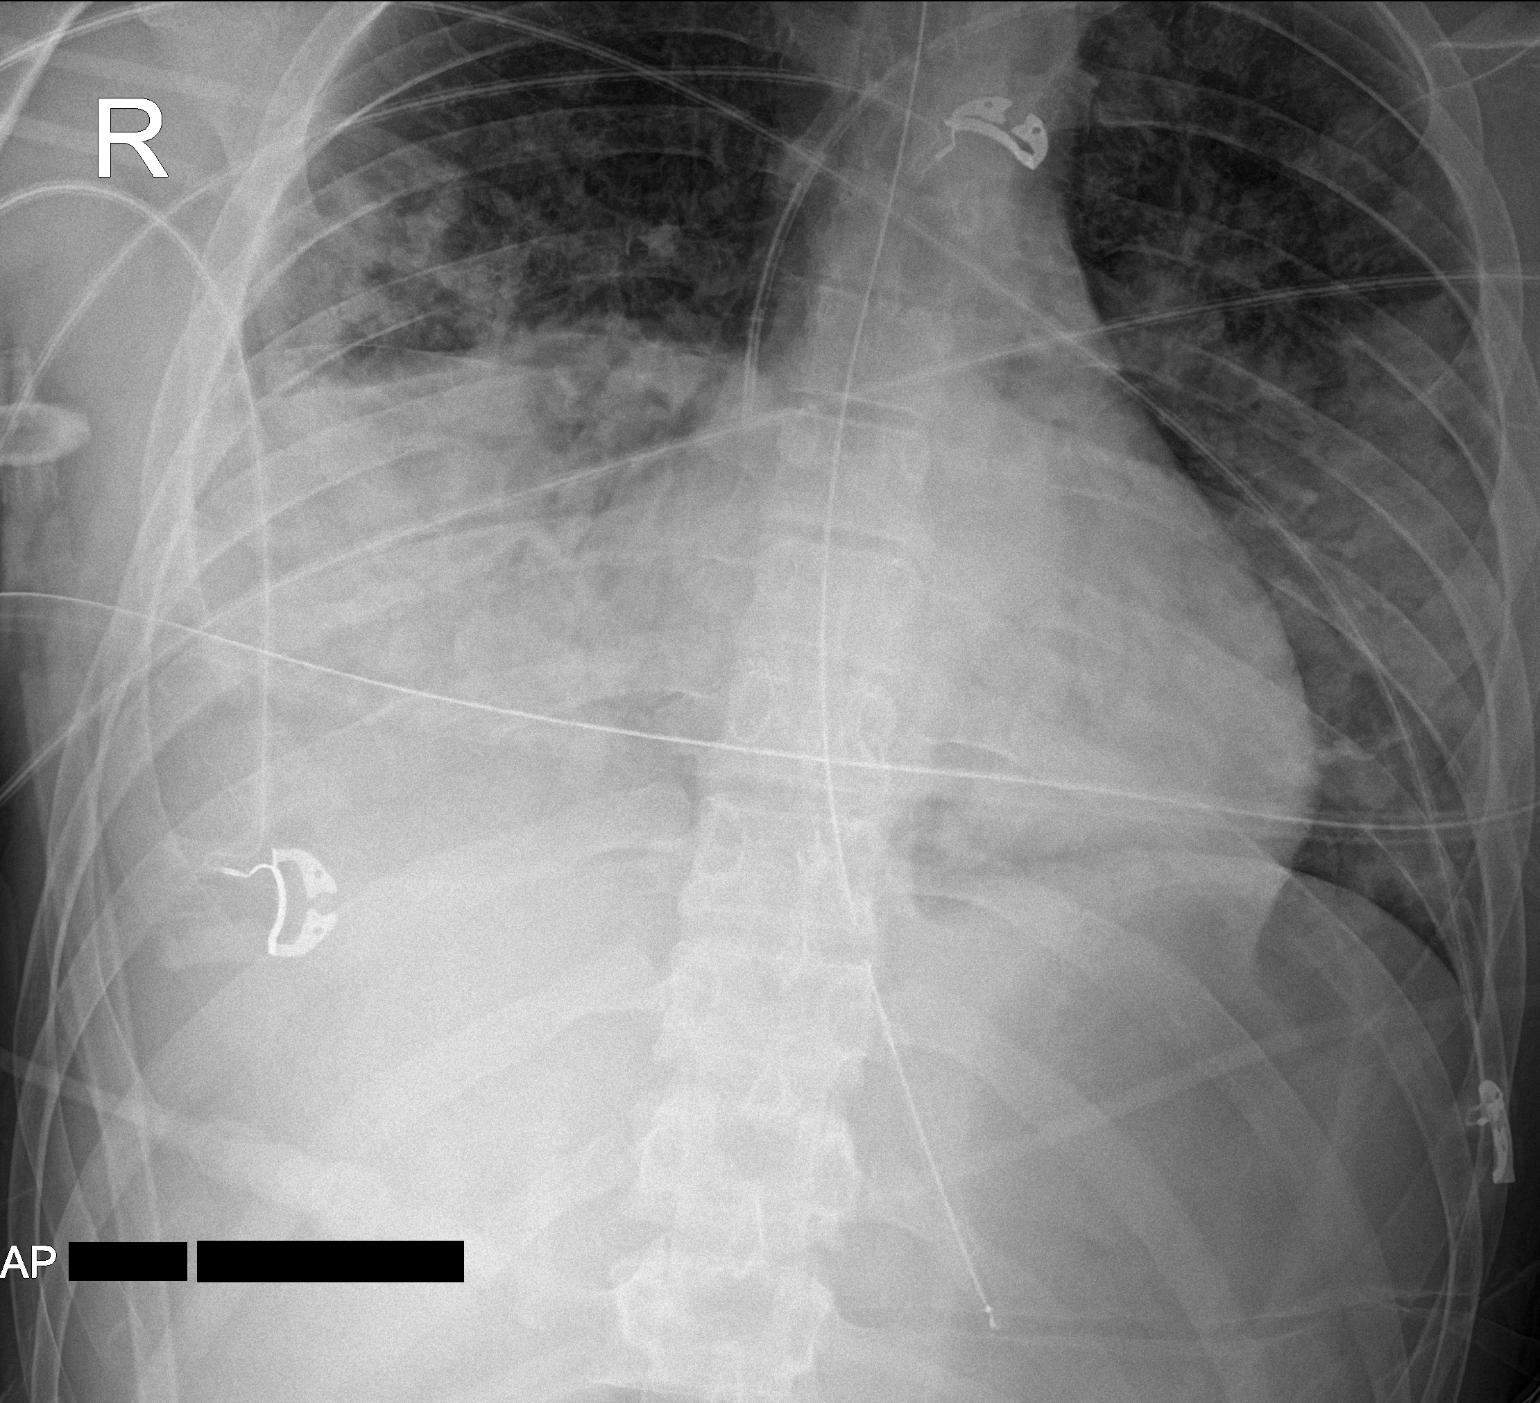

[chest ap (2 of 2)]
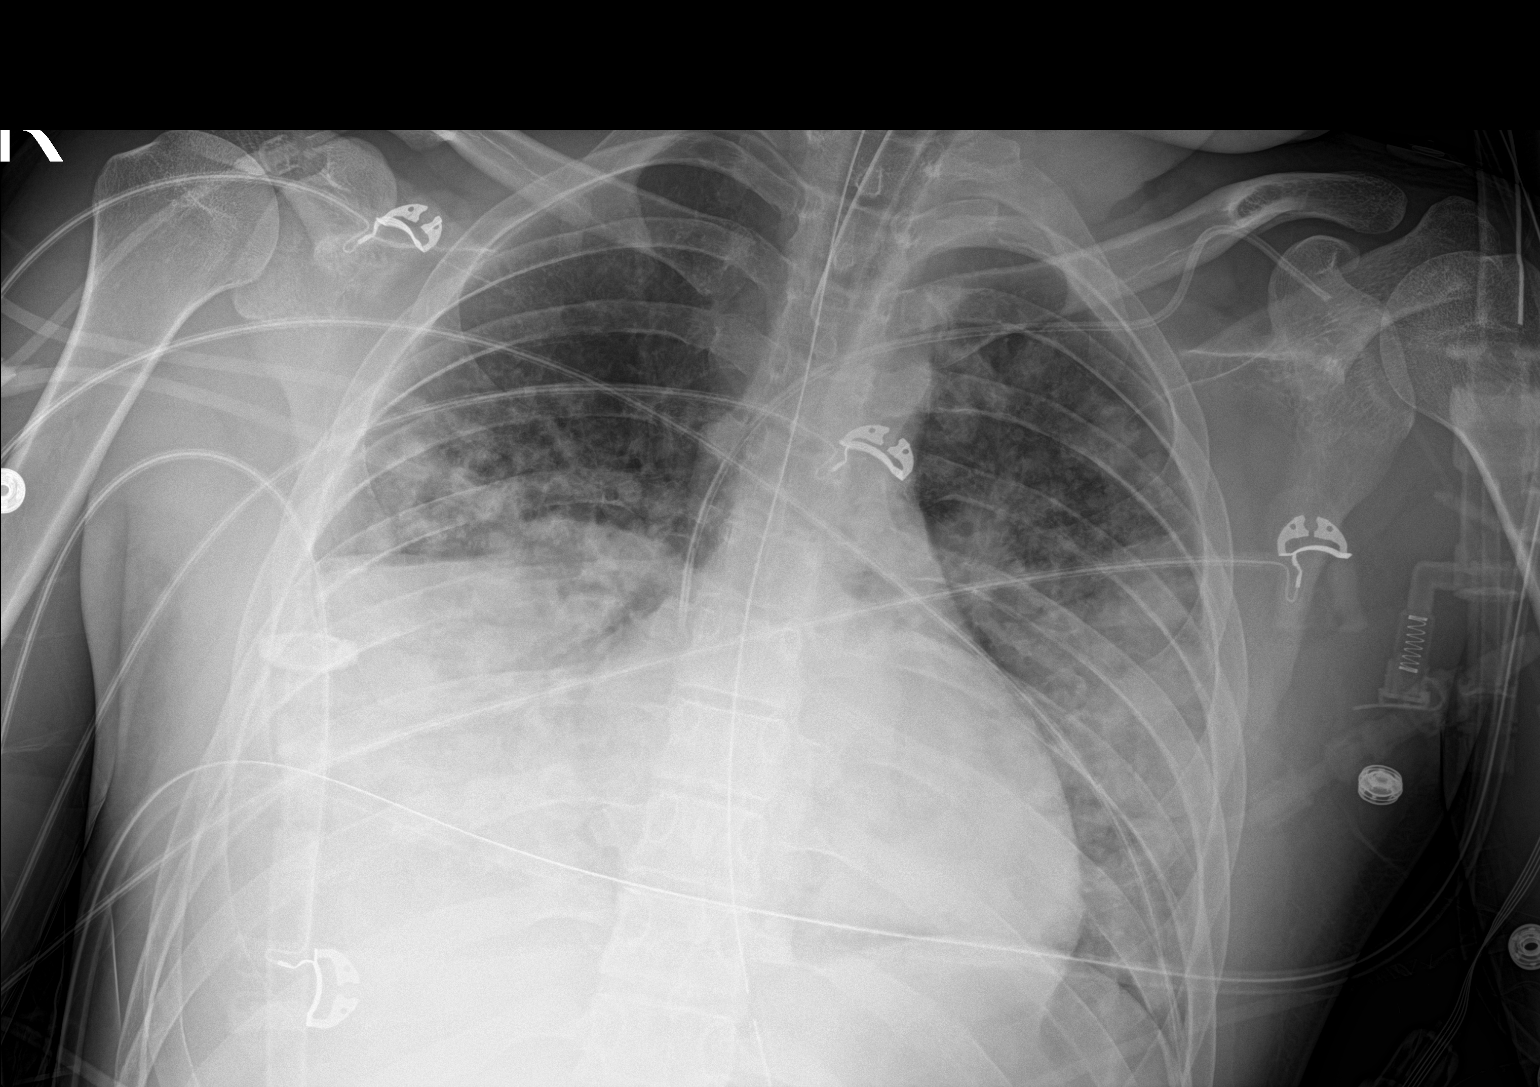

[2 of 2 positions shown; findings below may reference images not displayed]

FINDINGS: Two frontal views of the chest demonstrate endotracheal tube
overlying tracheal air column tip at level of thoracic inlet.
Enteric catheter passes below diaphragm tip projects over gastric
body. Left subclavian catheter tip projects over superior vena cava.

Cardiac silhouette is unremarkable. There is bilateral airspace
disease, with greatest consolidation in the right middle and right
lower lobes. No large effusion or pneumothorax.
IMPRESSION: 1. Support devices as above.
2. Multifocal bilateral airspace disease, with dense consolidation
at the right base.

## 2019-07-03 SURGERY — CRANIOTOMY HEMATOMA EVACUATION SUBDURAL
Anesthesia: General | Site: Head

## 2019-07-03 SURGERY — CRANIOTOMY HEMATOMA EVACUATION SUBDURAL
Anesthesia: General | Site: Head | Laterality: Right

## 2019-07-03 MED ORDER — PROPOFOL 500 MG/50ML IV EMUL
INTRAVENOUS | Status: DC | PRN
  Administered 2019-07-03: 50 ug/kg/min via INTRAVENOUS

## 2019-07-03 MED ORDER — SODIUM CHLORIDE 0.9% FLUSH: 10.0000 mL | INTRAVENOUS | Status: DC | PRN

## 2019-07-03 MED ORDER — PANTOPRAZOLE SODIUM 40 MG IV SOLR
40.0000 mg | Freq: Every day | INTRAVENOUS | Status: DC
  Administered 2019-07-03 – 2019-07-07 (×5): 40 mg via INTRAVENOUS
  Filled 2019-07-03 (×5): qty 40

## 2019-07-03 MED ORDER — FUROSEMIDE 10 MG/ML IJ SOLN
INTRAMUSCULAR | Status: AC
  Administered 2019-07-03: 20 mg
  Filled 2019-07-03: qty 4

## 2019-07-03 MED ORDER — THROMBIN 5000 UNITS EX SOLR
CUTANEOUS | Status: AC
  Filled 2019-07-03: qty 10000

## 2019-07-03 MED ORDER — THROMBIN (RECOMBINANT) 5000 UNITS EX SOLR
CUTANEOUS | Status: AC
  Filled 2019-07-03: qty 5000

## 2019-07-03 MED ORDER — BACITRACIN ZINC 500 UNIT/GM EX OINT
TOPICAL_OINTMENT | CUTANEOUS | Status: DC | PRN
  Administered 2019-07-03: 1 via TOPICAL

## 2019-07-03 MED ORDER — ALBUMIN HUMAN 5 % IV SOLN: INTRAVENOUS | Status: DC | PRN

## 2019-07-03 MED ORDER — LIDOCAINE-EPINEPHRINE 1 %-1:100000 IJ SOLN
INTRAMUSCULAR | Status: AC
  Filled 2019-07-03: qty 1

## 2019-07-03 MED ORDER — ESMOLOL HCL 100 MG/10ML IV SOLN
INTRAVENOUS | Status: DC | PRN
  Administered 2019-07-03: 30 mg via INTRAVENOUS

## 2019-07-03 MED ORDER — PROPOFOL 10 MG/ML IV BOLUS
INTRAVENOUS | Status: AC
  Filled 2019-07-03: qty 20

## 2019-07-03 MED ORDER — FENTANYL BOLUS VIA INFUSION
50.0000 ug | INTRAVENOUS | Status: DC | PRN
  Filled 2019-07-03: qty 50

## 2019-07-03 MED ORDER — BACITRACIN ZINC 500 UNIT/GM EX OINT
TOPICAL_OINTMENT | CUTANEOUS | Status: AC
  Filled 2019-07-03: qty 28.35

## 2019-07-03 MED ORDER — SODIUM CHLORIDE 3 % IV SOLN
INTRAVENOUS | Status: DC
  Administered 2019-07-03 (×2): 50 mL/h via INTRAVENOUS
  Administered 2019-07-04: 100 mL/h via INTRAVENOUS
  Administered 2019-07-04 (×2): 50 mL/h via INTRAVENOUS
  Administered 2019-07-05 (×2): 100 mL/h via INTRAVENOUS
  Administered 2019-07-06 – 2019-07-07 (×6): 125 mL/h via INTRAVENOUS
  Filled 2019-07-03 (×21): qty 500

## 2019-07-03 MED ORDER — MAGNESIUM SULFATE IN D5W 1-5 GM/100ML-% IV SOLN
1.0000 g | Freq: Once | INTRAVENOUS | Status: DC
  Filled 2019-07-03: qty 100

## 2019-07-03 MED ORDER — DEXAMETHASONE SODIUM PHOSPHATE 10 MG/ML IJ SOLN
10.0000 mg | Freq: Four times a day (QID) | INTRAMUSCULAR | Status: DC
  Administered 2019-07-03 – 2019-07-05 (×6): 10 mg via INTRAVENOUS
  Filled 2019-07-03 (×6): qty 1

## 2019-07-03 MED ORDER — PROPOFOL 10 MG/ML IV BOLUS
INTRAVENOUS | Status: DC | PRN
  Administered 2019-07-03: 60 mg via INTRAVENOUS

## 2019-07-03 MED ORDER — OXYCODONE HCL 5 MG PO TABS
10.0000 mg | ORAL_TABLET | ORAL | Status: DC | PRN
  Administered 2019-07-03 – 2019-07-11 (×18): 10 mg via ORAL
  Filled 2019-07-03 (×18): qty 2

## 2019-07-03 MED ORDER — METOCLOPRAMIDE HCL 5 MG/ML IJ SOLN
10.0000 mg | Freq: Four times a day (QID) | INTRAMUSCULAR | Status: DC
  Administered 2019-07-03 – 2019-07-10 (×26): 10 mg via INTRAVENOUS
  Filled 2019-07-03 (×26): qty 2

## 2019-07-03 MED ORDER — CEFAZOLIN SODIUM-DEXTROSE 2-3 GM-%(50ML) IV SOLR
INTRAVENOUS | Status: DC | PRN
  Administered 2019-07-03: 2 g via INTRAVENOUS

## 2019-07-03 MED ORDER — THROMBIN 5000 UNITS EX SOLR
CUTANEOUS | Status: AC
  Filled 2019-07-03: qty 5000

## 2019-07-03 MED ORDER — OXYCODONE HCL 5 MG PO TABS: 5.0000 mg | ORAL_TABLET | ORAL | Status: DC | PRN

## 2019-07-03 MED ORDER — MANNITOL 25 % IV SOLN
INTRAVENOUS | Status: AC
  Administered 2019-07-03: 12.5 g
  Filled 2019-07-03: qty 50

## 2019-07-03 MED ORDER — MIDAZOLAM HCL 2 MG/2ML IJ SOLN
INTRAMUSCULAR | Status: AC
  Filled 2019-07-03: qty 2

## 2019-07-03 MED ORDER — SODIUM CHLORIDE 3 % IV BOLUS
250.0000 mL | Freq: Once | INTRAVENOUS | Status: AC
  Administered 2019-07-03: 250 mL via INTRAVENOUS
  Filled 2019-07-03: qty 250

## 2019-07-03 MED ORDER — THROMBIN 20000 UNITS EX SOLR: CUTANEOUS | Status: DC | PRN

## 2019-07-03 MED ORDER — THROMBIN 20000 UNITS EX KIT
PACK | CUTANEOUS | Status: AC
  Filled 2019-07-03: qty 1

## 2019-07-03 MED ORDER — FENTANYL CITRATE (PF) 100 MCG/2ML IJ SOLN
INTRAMUSCULAR | Status: AC
  Filled 2019-07-03: qty 2

## 2019-07-03 MED ORDER — THROMBIN 5000 UNITS EX SOLR: OROMUCOSAL | Status: DC | PRN

## 2019-07-03 MED ORDER — FENTANYL CITRATE (PF) 250 MCG/5ML IJ SOLN
INTRAMUSCULAR | Status: AC
  Filled 2019-07-03: qty 5

## 2019-07-03 MED ORDER — PHENYLEPHRINE HCL (PRESSORS) 10 MG/ML IV SOLN
INTRAVENOUS | Status: DC | PRN
  Administered 2019-07-03: 80 ug via INTRAVENOUS

## 2019-07-03 MED ORDER — FENTANYL CITRATE (PF) 100 MCG/2ML IJ SOLN: 50.0000 ug | Freq: Once | INTRAMUSCULAR | Status: DC

## 2019-07-03 MED ORDER — 0.9 % SODIUM CHLORIDE (POUR BTL) OPTIME
TOPICAL | Status: DC | PRN
  Administered 2019-07-03 (×3): 1000 mL

## 2019-07-03 MED ORDER — ONDANSETRON HCL 4 MG/2ML IJ SOLN
INTRAMUSCULAR | Status: DC | PRN
  Administered 2019-07-03: 4 mg via INTRAVENOUS

## 2019-07-03 MED ORDER — CHLORHEXIDINE GLUCONATE 0.12% ORAL RINSE (MEDLINE KIT)
15.0000 mL | Freq: Two times a day (BID) | OROMUCOSAL | Status: DC
  Administered 2019-07-04 (×3): 15 mL via OROMUCOSAL

## 2019-07-03 MED ORDER — INSULIN ASPART 100 UNIT/ML ~~LOC~~ SOLN
0.0000 [IU] | SUBCUTANEOUS | Status: DC
  Administered 2019-07-04 – 2019-07-06 (×4): 1 [IU] via SUBCUTANEOUS
  Administered 2019-07-06 (×2): 2 [IU] via SUBCUTANEOUS
  Administered 2019-07-06 – 2019-07-07 (×5): 1 [IU] via SUBCUTANEOUS
  Administered 2019-07-07: 2 [IU] via SUBCUTANEOUS
  Administered 2019-07-07 – 2019-07-09 (×4): 1 [IU] via SUBCUTANEOUS

## 2019-07-03 MED ORDER — DEXAMETHASONE SODIUM PHOSPHATE 10 MG/ML IJ SOLN
INTRAMUSCULAR | Status: DC | PRN
  Administered 2019-07-03: 10 mg via INTRAVENOUS

## 2019-07-03 MED ORDER — ORAL CARE MOUTH RINSE
15.0000 mL | OROMUCOSAL | Status: DC
  Administered 2019-07-03 – 2019-07-04 (×7): 15 mL via OROMUCOSAL

## 2019-07-03 MED ORDER — THROMBIN 20000 UNITS EX SOLR
CUTANEOUS | Status: AC
  Filled 2019-07-03: qty 20000

## 2019-07-03 MED ORDER — GADOBUTROL 1 MMOL/ML IV SOLN
7.0000 mL | Freq: Once | INTRAVENOUS | Status: AC | PRN
  Administered 2019-07-03: 7 mL via INTRAVENOUS

## 2019-07-03 MED ORDER — THROMBIN 5000 UNITS EX SOLR
OROMUCOSAL | Status: DC | PRN
  Administered 2019-07-03: 5 mL via TOPICAL

## 2019-07-03 MED ORDER — ALBUTEROL SULFATE HFA 108 (90 BASE) MCG/ACT IN AERS
INHALATION_SPRAY | RESPIRATORY_TRACT | Status: DC | PRN
  Administered 2019-07-03: 4 via RESPIRATORY_TRACT

## 2019-07-03 MED ORDER — 0.9 % SODIUM CHLORIDE (POUR BTL) OPTIME
TOPICAL | Status: DC | PRN
  Administered 2019-07-03 (×2): 1000 mL

## 2019-07-03 MED ORDER — PROPOFOL 1000 MG/100ML IV EMUL
5.0000 ug/kg/min | INTRAVENOUS | Status: DC
  Administered 2019-07-03: 50 ug/kg/min via INTRAVENOUS
  Administered 2019-07-03: 20 ug/kg/min via INTRAVENOUS
  Administered 2019-07-04: 50 ug/kg/min via INTRAVENOUS
  Filled 2019-07-03 (×2): qty 100

## 2019-07-03 MED ORDER — HEMOSTATIC AGENTS (NO CHARGE) OPTIME
TOPICAL | Status: DC | PRN
  Administered 2019-07-03: 1 via TOPICAL

## 2019-07-03 MED ORDER — PHENYLEPHRINE HCL-NACL 10-0.9 MG/250ML-% IV SOLN
INTRAVENOUS | Status: DC | PRN
  Administered 2019-07-03: 40 ug/min via INTRAVENOUS

## 2019-07-03 MED ORDER — SODIUM CHLORIDE 0.9% FLUSH
10.0000 mL | Freq: Two times a day (BID) | INTRAVENOUS | Status: DC
  Administered 2019-07-03: 30 mL
  Administered 2019-07-04 – 2019-07-05 (×4): 10 mL
  Administered 2019-07-06: 30 mL
  Administered 2019-07-06 – 2019-07-07 (×2): 10 mL
  Administered 2019-07-07 – 2019-07-08 (×2): 20 mL
  Administered 2019-07-08 – 2019-07-10 (×4): 10 mL

## 2019-07-03 MED ORDER — SODIUM CHLORIDE 0.9 % IV SOLN: INTRAVENOUS | Status: DC | PRN

## 2019-07-03 MED ORDER — HEMOSTATIC AGENTS (NO CHARGE) OPTIME
TOPICAL | Status: DC | PRN
  Administered 2019-07-03 (×2): 1 via TOPICAL

## 2019-07-03 MED ORDER — FENTANYL 2500MCG IN NS 250ML (10MCG/ML) PREMIX INFUSION
50.0000 ug/h | INTRAVENOUS | Status: DC
  Administered 2019-07-03: 50 ug/h via INTRAVENOUS
  Administered 2019-07-04: 100 ug/h via INTRAVENOUS
  Filled 2019-07-03: qty 250

## 2019-07-03 MED ORDER — DEXAMETHASONE SODIUM PHOSPHATE 10 MG/ML IJ SOLN
10.0000 mg | Freq: Once | INTRAMUSCULAR | Status: AC
  Administered 2019-07-03: 10 mg via INTRAVENOUS
  Filled 2019-07-03: qty 1

## 2019-07-03 MED ORDER — ROCURONIUM BROMIDE 100 MG/10ML IV SOLN
INTRAVENOUS | Status: DC | PRN
  Administered 2019-07-03 (×5): 50 mg via INTRAVENOUS

## 2019-07-03 MED ORDER — FENTANYL CITRATE (PF) 100 MCG/2ML IJ SOLN
INTRAMUSCULAR | Status: DC | PRN
  Administered 2019-07-03: 50 ug via INTRAVENOUS
  Administered 2019-07-03: 100 ug via INTRAVENOUS
  Administered 2019-07-03 (×2): 50 ug via INTRAVENOUS
  Administered 2019-07-03: 200 ug via INTRAVENOUS
  Administered 2019-07-03: 50 ug via INTRAVENOUS

## 2019-07-03 MED ORDER — IPRATROPIUM-ALBUTEROL 0.5-2.5 (3) MG/3ML IN SOLN: 3.0000 mL | RESPIRATORY_TRACT | Status: DC | PRN

## 2019-07-03 MED ORDER — SODIUM CHLORIDE 0.9 % IV SOLN
3.0000 g | Freq: Four times a day (QID) | INTRAVENOUS | Status: AC
  Administered 2019-07-04 – 2019-07-10 (×28): 3 g via INTRAVENOUS
  Filled 2019-07-03 (×4): qty 3
  Filled 2019-07-03: qty 8
  Filled 2019-07-03 (×3): qty 3
  Filled 2019-07-03: qty 8
  Filled 2019-07-03 (×12): qty 3
  Filled 2019-07-03: qty 8
  Filled 2019-07-03 (×6): qty 3

## 2019-07-03 SURGICAL SUPPLY — 63 items
BLADE CLIPPER SURG (BLADE) IMPLANT
BNDG GAUZE ELAST 4 BULKY (GAUZE/BANDAGES/DRESSINGS) IMPLANT
BUR ACORN 9.0 PRECISION (BURR) IMPLANT
BUR MATCHSTICK NEURO 3.0 LAGG (BURR) IMPLANT
BUR SPIRAL ROUTER 2.3 (BUR) IMPLANT
CANISTER SUCT 3000ML PPV (MISCELLANEOUS) ×4 IMPLANT
CLIP VESOCCLUDE MED 6/CT (CLIP) IMPLANT
COVER WAND RF STERILE (DRAPES) IMPLANT
DRAPE NEUROLOGICAL W/INCISE (DRAPES) ×2 IMPLANT
DRAPE SHEET LG 3/4 BI-LAMINATE (DRAPES) ×2 IMPLANT
DRAPE SURG 17X23 STRL (DRAPES) IMPLANT
DRAPE WARM FLUID 44X44 (DRAPES) ×2 IMPLANT
DURAPREP 6ML APPLICATOR 50/CS (WOUND CARE) ×2 IMPLANT
ELECT REM PT RETURN 9FT ADLT (ELECTROSURGICAL) ×2
ELECTRODE REM PT RTRN 9FT ADLT (ELECTROSURGICAL) ×1 IMPLANT
EVACUATOR 1/8 PVC DRAIN (DRAIN) IMPLANT
EVACUATOR SILICONE 100CC (DRAIN) IMPLANT
GAUZE 4X4 16PLY RFD (DISPOSABLE) IMPLANT
GAUZE SPONGE 4X4 12PLY STRL (GAUZE/BANDAGES/DRESSINGS) IMPLANT
GLOVE BIO SURGEON STRL SZ7.5 (GLOVE) ×6 IMPLANT
GLOVE BIOGEL PI IND STRL 7.5 (GLOVE) ×2 IMPLANT
GLOVE BIOGEL PI INDICATOR 7.5 (GLOVE) ×2
GLOVE ECLIPSE 6.5 STRL STRAW (GLOVE) ×2 IMPLANT
GLOVE ECLIPSE 7.5 STRL STRAW (GLOVE) ×4 IMPLANT
GLOVE EXAM NITRILE LRG STRL (GLOVE) IMPLANT
GLOVE EXAM NITRILE XL STR (GLOVE) IMPLANT
GLOVE EXAM NITRILE XS STR PU (GLOVE) IMPLANT
GOWN STRL REUS W/ TWL LRG LVL3 (GOWN DISPOSABLE) ×3 IMPLANT
GOWN STRL REUS W/ TWL XL LVL3 (GOWN DISPOSABLE) IMPLANT
GOWN STRL REUS W/TWL 2XL LVL3 (GOWN DISPOSABLE) IMPLANT
GOWN STRL REUS W/TWL LRG LVL3 (GOWN DISPOSABLE) ×6
GOWN STRL REUS W/TWL XL LVL3 (GOWN DISPOSABLE)
HEMOSTAT POWDER KIT SURGIFOAM (HEMOSTASIS) ×4 IMPLANT
HEMOSTAT SURGICEL 2X14 (HEMOSTASIS) ×2 IMPLANT
HOOK DURA 1/2IN (MISCELLANEOUS) ×2 IMPLANT
KIT BASIN OR (CUSTOM PROCEDURE TRAY) ×2 IMPLANT
KIT TURNOVER KIT B (KITS) ×2 IMPLANT
NEEDLE HYPO 22GX1.5 SAFETY (NEEDLE) ×2 IMPLANT
NS IRRIG 1000ML POUR BTL (IV SOLUTION) ×6 IMPLANT
PACK CRANIOTOMY CUSTOM (CUSTOM PROCEDURE TRAY) ×2 IMPLANT
PATTIES SURGICAL .5 X.5 (GAUZE/BANDAGES/DRESSINGS) IMPLANT
PATTIES SURGICAL .5 X3 (DISPOSABLE) IMPLANT
PATTIES SURGICAL 1X1 (DISPOSABLE) IMPLANT
SCREW UNIII AXS SD 1.5X4 (Screw) ×12 IMPLANT
SPONGE NEURO XRAY DETECT 1X3 (DISPOSABLE) IMPLANT
SPONGE SURGIFOAM ABS GEL 100 (HEMOSTASIS) ×4 IMPLANT
STAPLER VISISTAT 35W (STAPLE) ×2 IMPLANT
STOCKINETTE 6  STRL (DRAPES) ×2
STOCKINETTE 6 STRL (DRAPES) ×1 IMPLANT
SUT ETHILON 3 0 FSL (SUTURE) IMPLANT
SUT ETHILON 3 0 PS 1 (SUTURE) IMPLANT
SUT NURALON 4 0 TR CR/8 (SUTURE) ×4 IMPLANT
SUT STEEL 0 (SUTURE)
SUT STEEL 0 18XMFL TIE 17 (SUTURE) IMPLANT
SUT VIC AB 0 CT1 18XCR BRD8 (SUTURE) ×2 IMPLANT
SUT VIC AB 0 CT1 8-18 (SUTURE) ×4
SUT VIC AB 3-0 SH 8-18 (SUTURE) ×4 IMPLANT
TOWEL GREEN STERILE (TOWEL DISPOSABLE) ×2 IMPLANT
TOWEL GREEN STERILE FF (TOWEL DISPOSABLE) ×2 IMPLANT
TRAY FOLEY MTR SLVR 16FR STAT (SET/KITS/TRAYS/PACK) IMPLANT
TUBE CONNECTING 12X1/4 (SUCTIONS) IMPLANT
UNDERPAD 30X30 (UNDERPADS AND DIAPERS) IMPLANT
WATER STERILE IRR 1000ML POUR (IV SOLUTION) ×2 IMPLANT

## 2019-07-03 SURGICAL SUPPLY — 67 items
BLADE CLIPPER SURG (BLADE) IMPLANT
BNDG GAUZE ELAST 4 BULKY (GAUZE/BANDAGES/DRESSINGS) IMPLANT
BUR ACORN 9.0 PRECISION (BURR) IMPLANT
BUR MATCHSTICK NEURO 3.0 LAGG (BURR) IMPLANT
BUR SPIRAL ROUTER 2.3 (BUR) IMPLANT
CANISTER SUCT 3000ML PPV (MISCELLANEOUS) ×4 IMPLANT
CATH VENTRIC 35X38 W/TROCAR LG (CATHETERS) ×2 IMPLANT
CLIP VESOCCLUDE MED 6/CT (CLIP) IMPLANT
COVER WAND RF STERILE (DRAPES) IMPLANT
DRAPE NEUROLOGICAL W/INCISE (DRAPES) ×2 IMPLANT
DRAPE SHEET LG 3/4 BI-LAMINATE (DRAPES) ×2 IMPLANT
DRAPE SURG 17X23 STRL (DRAPES) IMPLANT
DRAPE WARM FLUID 44X44 (DRAPES) ×2 IMPLANT
DURAPREP 26ML APPLICATOR (WOUND CARE) ×2 IMPLANT
DURAPREP 6ML APPLICATOR 50/CS (WOUND CARE) IMPLANT
ELECT REM PT RETURN 9FT ADLT (ELECTROSURGICAL) ×2
ELECTRODE REM PT RTRN 9FT ADLT (ELECTROSURGICAL) ×1 IMPLANT
EVACUATOR 1/8 PVC DRAIN (DRAIN) IMPLANT
EVACUATOR SILICONE 100CC (DRAIN) IMPLANT
GAUZE 4X4 16PLY RFD (DISPOSABLE) IMPLANT
GAUZE SPONGE 4X4 12PLY STRL (GAUZE/BANDAGES/DRESSINGS) ×2 IMPLANT
GLOVE BIO SURGEON STRL SZ 6.5 (GLOVE) ×2 IMPLANT
GLOVE BIO SURGEON STRL SZ7 (GLOVE) ×4 IMPLANT
GLOVE BIO SURGEON STRL SZ7.5 (GLOVE) ×4 IMPLANT
GLOVE BIOGEL PI IND STRL 7.5 (GLOVE) ×3 IMPLANT
GLOVE BIOGEL PI INDICATOR 7.5 (GLOVE) ×3
GLOVE ECLIPSE 7.5 STRL STRAW (GLOVE) ×6 IMPLANT
GLOVE EXAM NITRILE LRG STRL (GLOVE) IMPLANT
GLOVE EXAM NITRILE XL STR (GLOVE) IMPLANT
GLOVE EXAM NITRILE XS STR PU (GLOVE) IMPLANT
GOWN STRL REUS W/ TWL LRG LVL3 (GOWN DISPOSABLE) ×2 IMPLANT
GOWN STRL REUS W/ TWL XL LVL3 (GOWN DISPOSABLE) ×1 IMPLANT
GOWN STRL REUS W/TWL 2XL LVL3 (GOWN DISPOSABLE) IMPLANT
GOWN STRL REUS W/TWL LRG LVL3 (GOWN DISPOSABLE) ×4
GOWN STRL REUS W/TWL XL LVL3 (GOWN DISPOSABLE) ×2
HEMOSTAT POWDER KIT SURGIFOAM (HEMOSTASIS) ×2 IMPLANT
HEMOSTAT SURGICEL 2X14 (HEMOSTASIS) ×2 IMPLANT
HOOK DURA 1/2IN (MISCELLANEOUS) IMPLANT
KIT BASIN OR (CUSTOM PROCEDURE TRAY) ×2 IMPLANT
KIT DRAIN CSF ACCUDRAIN (MISCELLANEOUS) ×2 IMPLANT
KIT TURNOVER KIT B (KITS) ×2 IMPLANT
NEEDLE HYPO 22GX1.5 SAFETY (NEEDLE) ×2 IMPLANT
NS IRRIG 1000ML POUR BTL (IV SOLUTION) ×4 IMPLANT
PACK CRANIOTOMY CUSTOM (CUSTOM PROCEDURE TRAY) ×2 IMPLANT
PATTIES SURGICAL .5 X.5 (GAUZE/BANDAGES/DRESSINGS) IMPLANT
PATTIES SURGICAL .5 X3 (DISPOSABLE) ×2 IMPLANT
PATTIES SURGICAL 1X1 (DISPOSABLE) IMPLANT
SCREW UNIII AXS SD 1.5X4 (Screw) ×12 IMPLANT
SPONGE NEURO XRAY DETECT 1X3 (DISPOSABLE) IMPLANT
SPONGE SURGIFOAM ABS GEL 100 (HEMOSTASIS) ×2 IMPLANT
STAPLER VISISTAT 35W (STAPLE) ×2 IMPLANT
STOCKINETTE 6  STRL (DRAPES)
STOCKINETTE 6 STRL (DRAPES) IMPLANT
SUT ETHILON 3 0 FSL (SUTURE) ×2 IMPLANT
SUT ETHILON 3 0 PS 1 (SUTURE) IMPLANT
SUT NURALON 4 0 TR CR/8 (SUTURE) IMPLANT
SUT STEEL 0 (SUTURE)
SUT STEEL 0 18XMFL TIE 17 (SUTURE) IMPLANT
SUT VIC AB 0 CT1 18XCR BRD8 (SUTURE) ×2 IMPLANT
SUT VIC AB 0 CT1 8-18 (SUTURE) ×4
SUT VIC AB 3-0 SH 8-18 (SUTURE) IMPLANT
TOWEL GREEN STERILE (TOWEL DISPOSABLE) ×2 IMPLANT
TOWEL GREEN STERILE FF (TOWEL DISPOSABLE) ×2 IMPLANT
TRAY FOLEY MTR SLVR 16FR STAT (SET/KITS/TRAYS/PACK) IMPLANT
TUBE CONNECTING 12X1/4 (SUCTIONS) IMPLANT
UNDERPAD 30X30 (UNDERPADS AND DIAPERS) IMPLANT
WATER STERILE IRR 1000ML POUR (IV SOLUTION) ×2 IMPLANT

## 2019-07-03 NOTE — Op Note (Signed)
PATIENT: Stephen Beard  DAY OF SURGERY: 07/03/19   PRE-OPERATIVE DIAGNOSIS: Post-operative intracerebral hemorrhage  POST-OPERATIVE DIAGNOSIS: Post-operative intracerebral hemorrhage  PROCEDURE:Right craniotomy for hematoma evacuation  SURGEON: Surgeon(s) and Role: Judith Part, MD - Primary  ANESTHESIA:ETGA  BRIEF HISTORY: This is a 24 year old man in whom I performed a tumor resection for what is likely a high grade glioma. He had a post-operative hematoma that I emergently evacuated. His pupil normalized and we took him intubated to CT. On his post-operative CT head, there was still significant clot deep in the resection cavity. Given the continued pressure on the thalamus and 1cm of midline shift, I thought it best to return to the operating room and evacuate the remaining clot.   OPERATIVE DETAIL: The patient was taken to the operating room and placed on the OR table in the supine position. He was placed in a [place] head holder. The prior operative site was prepped and draped in a sterile fashion. The prior incision was opened and bone flap removed.    The brain was still fairly tense and the medial flap of occipital lobe was oozing and, given that it was disconnected deep to the cortex, this was resected. The cavity was inspected and prior hemostatic agents were removed. The hematoma was, as expected, quite deep. A fixed retractor was placed to elevate the occipital lobe laterally for better exposure. The hematoma was evacuated after being morselized with the bipolar. It was very adherent and fairly difficult to remove. There was what appeared to be underlying tumor tissue deep to this, which was adjacent to the thalamus. I therefore used bipolar to get hemostasis of this tissue, but did not resect it completely to avoid injuring eloquent structures.   After significant work to remove the clot, hemostasis was obtained and checked multiple times visually as well  as with multiple rounds of irrigation. The walls were lined with surgicel and floseal. I placed a ventricular catheter into the resection cavity and tunneled it adjacent to the incision. The brain was significantly less tense than before and had a good pulsatile wave again. Counts were correct x2, hemostasis was again confirmed, the bone flap was reapproximated after a defect was created to keep the drain free of friction, and the skin was closed in layers.   EBL:  32mL   DRAINS: Ventricular catheter in the resection cavity    SPECIMENS: none   Judith Part, MD 07/03/19 5:53 PM

## 2019-07-03 NOTE — Procedures (Signed)
Central Venous Catheter Insertion Procedure Note  Procedure: Insertion of Central Venous Catheter  Indications:  vascular access  Procedure Details  Informed consent was obtained for the procedure, including sedation.  Risks of lung perforation, hemorrhage, arrhythmia, and adverse drug reaction were discussed with patient's mother.  Maximum sterile technique was used including antiseptics, cap, gloves, gown, hand hygiene, mask and sheet.  Under sterile conditions the skin above the on the left subclavian vein was prepped with betadine and covered with a sterile drape. Local anesthesia was applied to the skin and subcutaneous tissues. A 22-gauge needle was used to identify the vein. An 18-gauge needle was then inserted into the vein. A guide wire was then passed easily through the catheter. There were no arrhythmias. The catheter was then withdrawn. A 7.5 French triple-lumen was then inserted into the vessel over the guide wire. The catheter was sutured into place.  Findings: There were no changes to vital signs. Catheter was flushed with 10 cc NS. Patient did tolerate procedure well.  Recommendations: CXR ordered to verify placement.

## 2019-07-03 NOTE — Consult Note (Signed)
NAME:  Stephen Beard, MRN:  [id number], DOB:  Aug 01, 1995, LOS: 3 ADMISSION DATE:  06/30/2019, CONSULTATION DATE: 07/03/2019 REFERRING MD: Dr. Zada Finders, CHIEF COMPLAINT: Status post hematoma evacuation  Brief History   Patient is a 24 year old seen in the emergency room on the 18th admitted from there after work-up for headache revealed parietal and occipital glioma  History of present illness   Patient is a 24 year old admitted on the 18th with a new diagnosis of parietal and occipital glioma.  He underwent resection yesterday with postoperative hematoma requiring a trip to the operating room this evening for evacuation.  He seen post operatively in the neuro ICU.  [place] as he is heavily sedated on fentanyl and propofol mechanically ventilated.  He does withdraw to pain.  His pupils are 2 mm and not light responsive.  He does overbreathing the ventilator.  Laboratories are wholly unremarkable.  He is on Decadron.  Currently the patient is relatively hypoxemic on 100% FiO2 with O2 saturation 100% PEEP of 7.  His lungs are coarse in both lung fields.  Chest x-ray postoperatively is pending.  Past Medical History  ADHD on Adderall  Significant Hospital Events   07/02/2019: Section parietal-occipital glioma 07/03/2019: Postoperative hematoma evacuation  Consults:  PCCM  Procedures:  As above  Significant Diagnostic Tests:  Serial CT scans  Micro Data:  NA  Antimicrobials:  NA  Interim history/subjective:  As above  Objective   Blood pressure (!) 149/50, pulse 94, temperature 99 F (37.2 C), temperature source Oral, resp. rate 15, height 5\' 7"  (1.702 m), weight 59 kg, SpO2 97 %.    Vent Mode: PRVC FiO2 (%):  [100 %] 100 % Set Rate:  [15 bmp-18 bmp] 18 bmp Vt Set:  [520 mL] 520 mL PEEP:  [5 cmH20-7 cmH20] 7 cmH20 Plateau Pressure:  [30 cmH20] 30 cmH20   Intake/Output Summary (Last 24 hours) at 07/03/2019 2214 Last data filed at 07/03/2019 2200 Gross per 24 hour  Intake  4467.02 ml  Output 4152 ml  Net 315.02 ml   Filed Weights   07/02/19 0725  Weight: 59 kg    Examination: General: White male appears stated age HENT: Intubated within normal limits Lungs: Coarse bilaterally Cardiovascular: Regular Abdomen: Benign bowel sounds positive Extremities: Within normal limits Neuro: As above GU: N/A  Resolved Hospital Problem list   NA  Assessment & Plan:  1.  Status post resection of a parietal and occipital glioma, status post hematoma evacuation today: Plan per neurosurgery  2.  Postoperative mechanical ventilation with relative hypoxemia: Chest x-ray pending.  Possibly related to intraoperative volume versus neurogenic.  pH 7.377  3.  On Decadron we will monitor blood sugars and treat appropriately  Best practice:  Diet: N.p.o. for now Pain/Anxiety/Delirium protocol (if indicated): Propofol and fentanyl VAP protocol (if indicated): Yes DVT prophylaxis: SCDs GI prophylaxis: Pepcid Glucose control: Yes Mobility: Bedrest Code Status: Full Family Communication: Discussed case with mother also obtained consent for central line placement after discussing risks of possible bleeding and pneumothorax Disposition: Patient will be reevaluated by PCCM in the morning  Labs   CBC: Recent Labs  Lab 06/30/19 2028 06/30/19 2028 07/02/19 1229 07/02/19 1513 07/02/19 1634 07/02/19 2231 07/03/19 0900  WBC 8.4  --   --   --   --   --  20.9*  NEUTROABS 4.8  --   --   --   --   --   --   HGB 16.4   < > 13.6  12.2* 7.5* 9.9* 10.5*  HCT 46.2   < > 40.0 36.0* 22.0* 29.3* 30.7*  MCV 91.1  --   --   --   --   --  93.0  PLT 228  --   --   --   --   --  188   < > = values in this interval not displayed.    Basic Metabolic Panel: Recent Labs  Lab 06/30/19 2028 07/02/19 1229 07/02/19 1513 07/02/19 1634 07/03/19 0900  NA 138 140 144 147* 136  K 4.0 3.9 3.9 3.8 3.5  CL 104  --   --   --  101  CO2 22  --   --   --  24  GLUCOSE 102*  --   --   --  147*   BUN 11  --   --   --  7  CREATININE 1.07  --   --   --  0.61  CALCIUM 9.3  --   --   --  8.5*  PHOS  --   --   --   --  3.1   GFR: Estimated Creatinine Clearance: 118.8 mL/min (by C-G formula based on SCr of 0.61 mg/dL). Recent Labs  Lab 06/30/19 2028 07/03/19 0900  WBC 8.4 20.9*    Liver Function Tests: Recent Labs  Lab 07/03/19 0900  ALBUMIN 4.0   No results for input(s): LIPASE, AMYLASE in the last 168 hours. No results for input(s): AMMONIA in the last 168 hours.  ABG    Component Value Date/Time   PHART 7.377 07/02/2019 1634   PCO2ART 37.3 07/02/2019 1634   PO2ART 257.0 (H) 07/02/2019 1634   HCO3 22.1 07/02/2019 1634   TCO2 23 07/02/2019 1634   ACIDBASEDEF 3.0 (H) 07/02/2019 1634   O2SAT 100.0 07/02/2019 1634     Coagulation Profile: Recent Labs  Lab 07/03/19 0900  INR 1.1    Cardiac Enzymes: No results for input(s): CKTOTAL, CKMB, CKMBINDEX, TROPONINI in the last 168 hours.  HbA1C: No results found for: HGBA1C  CBG: No results for input(s): GLUCAP in the last 168 hours.  Review of Systems:   Unable to obtain patient is mechanically ventilated heavily sedated post neurosurgical procedure  Past Medical History  He,  has a past medical history of ADHD.   Surgical History    Past Surgical History:  Procedure Laterality Date  . APPLICATION OF CRANIAL NAVIGATION N/A 07/02/2019   Procedure: APPLICATION OF CRANIAL NAVIGATION;  Surgeon: Judith Part, MD;  Location: [facility];  Service: Neurosurgery;  Laterality: N/A;  . CRANIOTOMY Right 07/02/2019   Procedure: CRANIOTOMY TUMOR EXCISION with Lucky Rathke;  Surgeon: Judith Part, MD;  Location: [facility];  Service: Neurosurgery;  Laterality: Right;  posterior     Social History   reports that he has been smoking. He does not have any smokeless tobacco history on file. He reports current alcohol use. He reports that he does not use drugs.   Family History   His family history is not on file.    Allergies No Known Allergies   Home Medications  Prior to Admission medications   Medication Sig Start Date End Date Taking? Authorizing Provider  amphetamine-dextroamphetamine (ADDERALL) 20 MG tablet Take 20 mg by mouth 2 (two) times daily. 05/30/19  Yes [provider]     Critical care time: Over 35 minutes was spent in bedside evaluation, chart review and critical care planning outside of central line placement.

## 2019-07-03 NOTE — Progress Notes (Signed)
RN paged on call NS MD regarding patient's uncontrolled pain despite admin of IV dilaudid and PO oxycodone.  RN was given orders for one time 10mg  dose of Dexamethasone.  Will give and continue to obersve and act accordingly.

## 2019-07-03 NOTE — Progress Notes (Signed)
Neurosurgery Service Post-operative progress note  Assessment & Plan: 24 y.o. man s/p evacuation of post-op hematoma x2. CTH w/ improvement in hematoma and brain compression, midline shift decreased from 54mm to 58mm, ventricles appear better decompressed.   -keep intubated overnight, will consult PCCM -start 3% hypertonic saline, continue high dose dex -gave 2U pRBC intra-op  Judith Part  07/03/19 8:32 PM

## 2019-07-03 NOTE — Progress Notes (Signed)
RT transported pt from [place] on vent to [id number] without complication. Pt respiratory status stable at this time. RT will continue to monitor.

## 2019-07-03 NOTE — Anesthesia Postprocedure Evaluation (Signed)
Anesthesia Post Note  Patient: Stephen Beard  Procedure(s) Performed: CRANIOTOMY HEMATOMA EVACUATION SUBDURAL (Right Head) CRANIOTOMY HEMATOMA EVACUATION SUBDURAL (N/A Head)     Patient location during evaluation: ICU Anesthesia Type: General Level of consciousness: patient remains intubated per anesthesia plan Pain management: pain level controlled Vital Signs Assessment: post-procedure vital signs reviewed and stable Respiratory status: patient remains intubated per anesthesia plan Cardiovascular status: stable Postop Assessment: no apparent nausea or vomiting Anesthetic complications: no    Last Vitals:  Vitals:   07/03/19 1600 07/03/19 2030  BP: (!) 152/64 (!) 149/50  Pulse: (!) 54 94  Resp:  15  Temp:    SpO2: 97% 97%    Last Pain:  Vitals:   07/03/19 1424  TempSrc:   PainSc: Asleep                 Pia Jedlicka

## 2019-07-03 NOTE — Transfer of Care (Signed)
Immediate Anesthesia Transfer of Care Note  Patient: Stephen Beard  Procedure(s) Performed: CRANIOTOMY HEMATOMA EVACUATION SUBDURAL (Right Head) CRANIOTOMY HEMATOMA EVACUATION SUBDURAL (N/A Head)  Patient Location: NICU  Anesthesia Type:General  Level of Consciousness: Patient remains intubated per anesthesia plan  Airway & Oxygen Therapy: Patient remains intubated per anesthesia plan and Patient placed on Ventilator (see vital sign flow sheet for setting)  Post-op Assessment: Report given to RN and Post -op Vital signs reviewed and stable  Post vital signs: Reviewed and stable  Last Vitals:  Vitals Value Taken Time  BP 112/52 07/03/19 2021  Temp    Pulse 93 07/03/19 2023  Resp 29 07/03/19 2023  SpO2 90 % 07/03/19 2023  Vitals shown include unvalidated device data.  Last Pain:  Vitals:   07/03/19 1424  TempSrc:   PainSc: Asleep         Complications: No apparent anesthesia complications

## 2019-07-03 NOTE — Progress Notes (Signed)
1500:  Neurosurgeon MD updated family about patient's condition and result of MRI.  Patient alert and oriented at this time.  Patient requested Zofran.  PRN Zofran given at 1505.    1600:  RN came to assess patient and found patient unresponsive.  RN assess patient's pupil and found right pupil blown.  Primary neurosurgeon called and receive new orders.    1605:  Patient proceeded to vomit and noted to have posturing on right upper extremity.  Neurosurgeon Dr. Christella Noa on neurotrauma intensive care unit during this event and was called to immediately assess patient.  Mannitol and lasix given verbally per Dr. Christella Noa.  Anaesthesia paged and Dr. Zada Finders paged again.  Patient was intubated by anaesthesia and taken back to OR.  Patient's mom and significant other updated at this time and were taken back to waiting room.

## 2019-07-03 NOTE — Anesthesia Preprocedure Evaluation (Signed)
Anesthesia Evaluation  Patient identified by MRN, date of birth, ID band Patient unresponsive  Preop documentation limited or incomplete due to emergent nature of procedure.  Airway Mallampati: Intubated       Dental   Pulmonary Current Smoker,    breath sounds clear to auscultation       Cardiovascular  Rate:Normal     Neuro/Psych    GI/Hepatic   Endo/Other    Renal/GU      Musculoskeletal   Abdominal   Peds  Hematology   Anesthesia Other Findings   Reproductive/Obstetrics                             Anesthesia Physical Anesthesia Plan  ASA: V  Anesthesia Plan: General   Post-op Pain Management:    Induction: Intravenous and Rapid sequence  PONV Risk Score and Plan: 1 and Ondansetron, Dexamethasone and Treatment may vary due to age or medical condition  Airway Management Planned: Oral ETT  Additional Equipment: Arterial line  Intra-op Plan:   Post-operative Plan: Post-operative intubation/ventilation  Informed Consent: I have reviewed the patients History and Physical, chart, labs and discussed the procedure including the risks, benefits and alternatives for the proposed anesthesia with the patient or authorized representative who has indicated his/her understanding and acceptance.     Dental advisory given  Plan Discussed with: CRNA and Surgeon  Anesthesia Plan Comments:         Anesthesia Quick Evaluation

## 2019-07-03 NOTE — Progress Notes (Signed)
Neurosurgery Service Progress Note  Subjective: No acute events overnight, at MRI when I rounded this morning, still having significant headaches, neurologically doing well  Objective: Vitals:   07/03/19 0500 07/03/19 0600 07/03/19 0700 07/03/19 0800  BP: 115/75 122/68 132/70 112/75  Pulse: (!) 43 (!) 43 (!) 41 (!) 54  Resp: (!) 8 (!) 9 (!) 9 14  Temp:    98.3 F (36.8 C)  TempSrc:    Oral  SpO2: 98% 97% 96% 99%  Weight:      Height:       Temp (24hrs), Avg:98.4 F (36.9 C), Min:98 F (36.7 C), Max:98.8 F (37.1 C)  CBC Latest Ref Rng & Units 07/02/2019 07/02/2019 07/02/2019  WBC 4.0 - 10.5 K/uL - - -  Hemoglobin 13.0 - 17.0 g/dL 9.9(L) 7.5(L) 12.2(L)  Hematocrit 39.0 - 52.0 % 29.3(L) 22.0(L) 36.0(L)  Platelets 150 - 400 K/uL - - -   BMP Latest Ref Rng & Units 07/02/2019 07/02/2019 07/02/2019  Glucose 70 - 99 mg/dL - - -  BUN 6 - 20 mg/dL - - -  Creatinine 0.61 - 1.24 mg/dL - - -  Sodium 135 - 145 mmol/L 147(H) 144 140  Potassium 3.5 - 5.1 mmol/L 3.8 3.9 3.9  Chloride 98 - 111 mmol/L - - -  CO2 22 - 32 mmol/L - - -  Calcium 8.9 - 10.3 mg/dL - - -    Intake/Output Summary (Last 24 hours) at 07/03/2019 0856 Last data filed at 07/03/2019 0800 Gross per 24 hour  Intake 4414.21 ml  Output 4625 ml  Net -210.79 ml    Current Facility-Administered Medications:  .  0.9 %  sodium chloride infusion (Manually program via Guardrails IV Fluids), , Intravenous, Once, Eligha Bridegroom, Immunologist .  0.9 %  sodium chloride infusion, , Intravenous, Continuous, Ashtin Melichar, Joyice Faster, MD, Last Rate: 50 mL/hr at 07/03/19 0800, Rate Verify at 07/03/19 0800 .  0.9 %  sodium chloride infusion, , Intravenous, Continuous, Oleta Mouse, MD .  acetaminophen (TYLENOL) tablet 650 mg, 650 mg, Oral, Q6H PRN **OR** acetaminophen (TYLENOL) suppository 650 mg, 650 mg, Rectal, Q6H PRN, Rozalia Dino, Joyice Faster, MD .  Chlorhexidine Gluconate Cloth 2 % PADS 6 each, 6 each, Topical, Daily, Judith Part, MD, 6  each at 07/02/19 1820 .  HYDROcodone-acetaminophen (NORCO/VICODIN) 5-325 MG per tablet 1-2 tablet, 1-2 tablet, Oral, Q4H PRN, Judith Part, MD, 2 tablet at 07/03/19 0452 .  HYDROmorphone (DILAUDID) injection 1 mg, 1 mg, Intravenous, Q3H PRN, Judith Part, MD, 1 mg at 07/03/19 0646 .  nicotine (NICODERM CQ - dosed in mg/24 hours) patch 21 mg, 21 mg, Transdermal, Daily, Desirey Keahey, Joyice Faster, MD, 21 mg at 07/01/19 1029 .  ondansetron (ZOFRAN) tablet 4 mg, 4 mg, Oral, Q6H PRN **OR** ondansetron (ZOFRAN) injection 4 mg, 4 mg, Intravenous, Q6H PRN, Judith Part, MD, 4 mg at 07/03/19 0211 .  senna-docusate (Senokot-S) tablet 1 tablet, 1 tablet, Oral, QHS PRN, Meyran, [name], NP   Physical Exam: Eyes closed, open to voice, FCx4 with full strength, incision c/d/i  Assessment & Plan: 24 y.o. 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 4/20 s/p craniotomy for resection, 4/21 post-op MRI with good resection, resection bed hematoma, midline shift decreased but still 29mm.  -will get screening labs given significant EBL -path pending -will try headache cocktail to improve pain  control. I explained to his boyfriend and mother that he has a resection bed hematoma, which can happen with subtotal resection of highly vascular tumors. As long as he is neurologically stable, no need for emergent hematoma evacuation. If he continues to have severe headaches, will give the hematoma 48h post-op and then remove it with the hope to prevent recurrent hemorrhage from the tumor. Will hold SQH given the hematoma and possibility of return to the OR. -continue high dose dex -SCDs/TEDs, hold SQH  Judith Part  07/03/19 8:56 AM

## 2019-07-03 NOTE — Op Note (Signed)
PATIENT: Stephen Beard  DAY OF SURGERY: 07/03/19   PRE-OPERATIVE DIAGNOSIS:  Post-operative intracerebral hemorrhage   POST-OPERATIVE DIAGNOSIS:  Post-operative intracerebral hemorrhage   PROCEDURE:  Right craniotomy for hematoma evacuation   SURGEON:  Surgeon(s) and Role:    Judith Part, MD - Primary    Ashok Pall, MD - Assisting   ANESTHESIA: ETGA   BRIEF HISTORY: This is a 24 year old man in whom I performed a tumor resection for what is likely a high grade glioma. He had a post-operative hematoma on his MRI this morning, but was neurologically doing well except for a severe headache. This afternoon, shortly after I saw him on afternoon rounds, he rapidly decompensated and blew his right pupil. One of my partners, Dr. Christella Noa happened to be on the floor and had the patient intubated and got him ready for the OR. We took him to the OR emergently to evacuate the hematoma.   OPERATIVE DETAIL: The patient was taken to the operating room and placed on the OR table in the supine position. He was placed in a [place] head holder. The prior operative site was prepped and draped in a sterile fashion. The prior incision was opened with immediate egress of hematoma under pressure. The bone flap was rapidly removed with some intracerebral hemorrhage present as well. In addition to the resection cavity, the exposed surface of the occipital lobe was oozing significantly and hemostasis.The hematoma was evacuated with a suction and hemostasis was obtained. Dr. Christella Noa assisted with retraction to expose the surgical cavity and helped with hemostasis. The cavity was lined with floseal and irrigation returned clear with multiple rounds of irrigation. Gelfoam was placed over the cortical surface and the bone flap was replaced with titanium screws.   All instrument and sponge counts were correct, the incision was then closed in layers. The patient was then returned to anesthesia.   EBL:  148mL    DRAINS: none   SPECIMENS: none   Judith Part, MD 07/03/19 5:47 PM

## 2019-07-03 NOTE — Progress Notes (Signed)
Patient intubated by CRNA at bedside on 4N. Patient bagged down to OR. Ventilator set up in room for patient return.

## 2019-07-04 ENCOUNTER — Inpatient Hospital Stay (HOSPITAL_COMMUNITY): Payer: Medicaid Other

## 2019-07-04 ENCOUNTER — Encounter: Payer: Self-pay | Admitting: *Deleted

## 2019-07-04 DIAGNOSIS — J9600 Acute respiratory failure, unspecified whether with hypoxia or hypercapnia: Secondary | ICD-10-CM

## 2019-07-04 LAB — BPAM RBC
Blood Product Expiration Date: 202105112359
Blood Product Expiration Date: 202105122359
Blood Product Expiration Date: 202105122359
Blood Product Expiration Date: 202105132359
Blood Product Expiration Date: 202105132359
ISSUE DATE / TIME: 202104201659
ISSUE DATE / TIME: 202104211648
ISSUE DATE / TIME: 202104211648
ISSUE DATE / TIME: 202104211910
ISSUE DATE / TIME: 202104211910
Unit Type and Rh: 6200
Unit Type and Rh: 6200
Unit Type and Rh: 6200
Unit Type and Rh: 6200
Unit Type and Rh: 6200

## 2019-07-04 LAB — RENAL FUNCTION PANEL
Albumin: 2.9 g/dL — ABNORMAL LOW (ref 3.5–5.0)
Anion gap: 7 (ref 5–15)
BUN: 6 mg/dL (ref 6–20)
CO2: 22 mmol/L (ref 22–32)
Calcium: 7.7 mg/dL — ABNORMAL LOW (ref 8.9–10.3)
Chloride: 111 mmol/L (ref 98–111)
Creatinine, Ser: 0.66 mg/dL (ref 0.61–1.24)
GFR calc Af Amer: >60 mL/min (ref >60)
GFR calc non Af Amer: >60 mL/min (ref >60)
Glucose, Bld: 120 mg/dL — ABNORMAL HIGH (ref 70–99)
Phosphorus: 1.4 mg/dL — ABNORMAL LOW (ref 2.5–4.6)
Potassium: 3.7 mmol/L (ref 3.5–5.1)
Sodium: 140 mmol/L (ref 135–145)

## 2019-07-04 LAB — GLUCOSE, CAPILLARY
Glucose-Capillary: 105 mg/dL — ABNORMAL HIGH (ref 70–99)
Glucose-Capillary: 108 mg/dL — ABNORMAL HIGH (ref 70–99)
Glucose-Capillary: 118 mg/dL — ABNORMAL HIGH (ref 70–99)
Glucose-Capillary: 118 mg/dL — ABNORMAL HIGH (ref 70–99)
Glucose-Capillary: 119 mg/dL — ABNORMAL HIGH (ref 70–99)
Glucose-Capillary: 125 mg/dL — ABNORMAL HIGH (ref 70–99)
Glucose-Capillary: 126 mg/dL — ABNORMAL HIGH (ref 70–99)

## 2019-07-04 LAB — TYPE AND SCREEN
ABO/RH(D): A POS
Antibody Screen: NEGATIVE
Unit division: 0
Unit division: 0
Unit division: 0
Unit division: 0
Unit division: 0

## 2019-07-04 LAB — CBC
HCT: 27 % — ABNORMAL LOW (ref 39.0–52.0)
Hemoglobin: 9 g/dL — ABNORMAL LOW (ref 13.0–17.0)
MCH: 30.4 pg (ref 26.0–34.0)
MCHC: 33.3 g/dL (ref 30.0–36.0)
MCV: 91.2 fL (ref 80.0–100.0)
Platelets: 120 10*3/uL — ABNORMAL LOW (ref 150–400)
RBC: 2.96 MIL/uL — ABNORMAL LOW (ref 4.22–5.81)
RDW: 14.1 % (ref 11.5–15.5)
WBC: 11.6 10*3/uL — ABNORMAL HIGH (ref 4.0–10.5)
nRBC: 0 % (ref 0.0–0.2)

## 2019-07-04 LAB — SURGICAL PATHOLOGY

## 2019-07-04 LAB — SODIUM
Sodium: 138 mmol/L (ref 135–145)
Sodium: 140 mmol/L (ref 135–145)
Sodium: 139 mmol/L (ref 135–145)
Sodium: 141 mmol/L (ref 135–145)

## 2019-07-04 IMAGING — DX DG CHEST 1V PORT
1 series · 1 of 1 positions shown · non-contrast
Comparison: Radiograph yesterday.

CLINICAL DATA: Acute respiratory failure with hypoxia.

EXAM:
PORTABLE CHEST 1 VIEW

[chest ap]
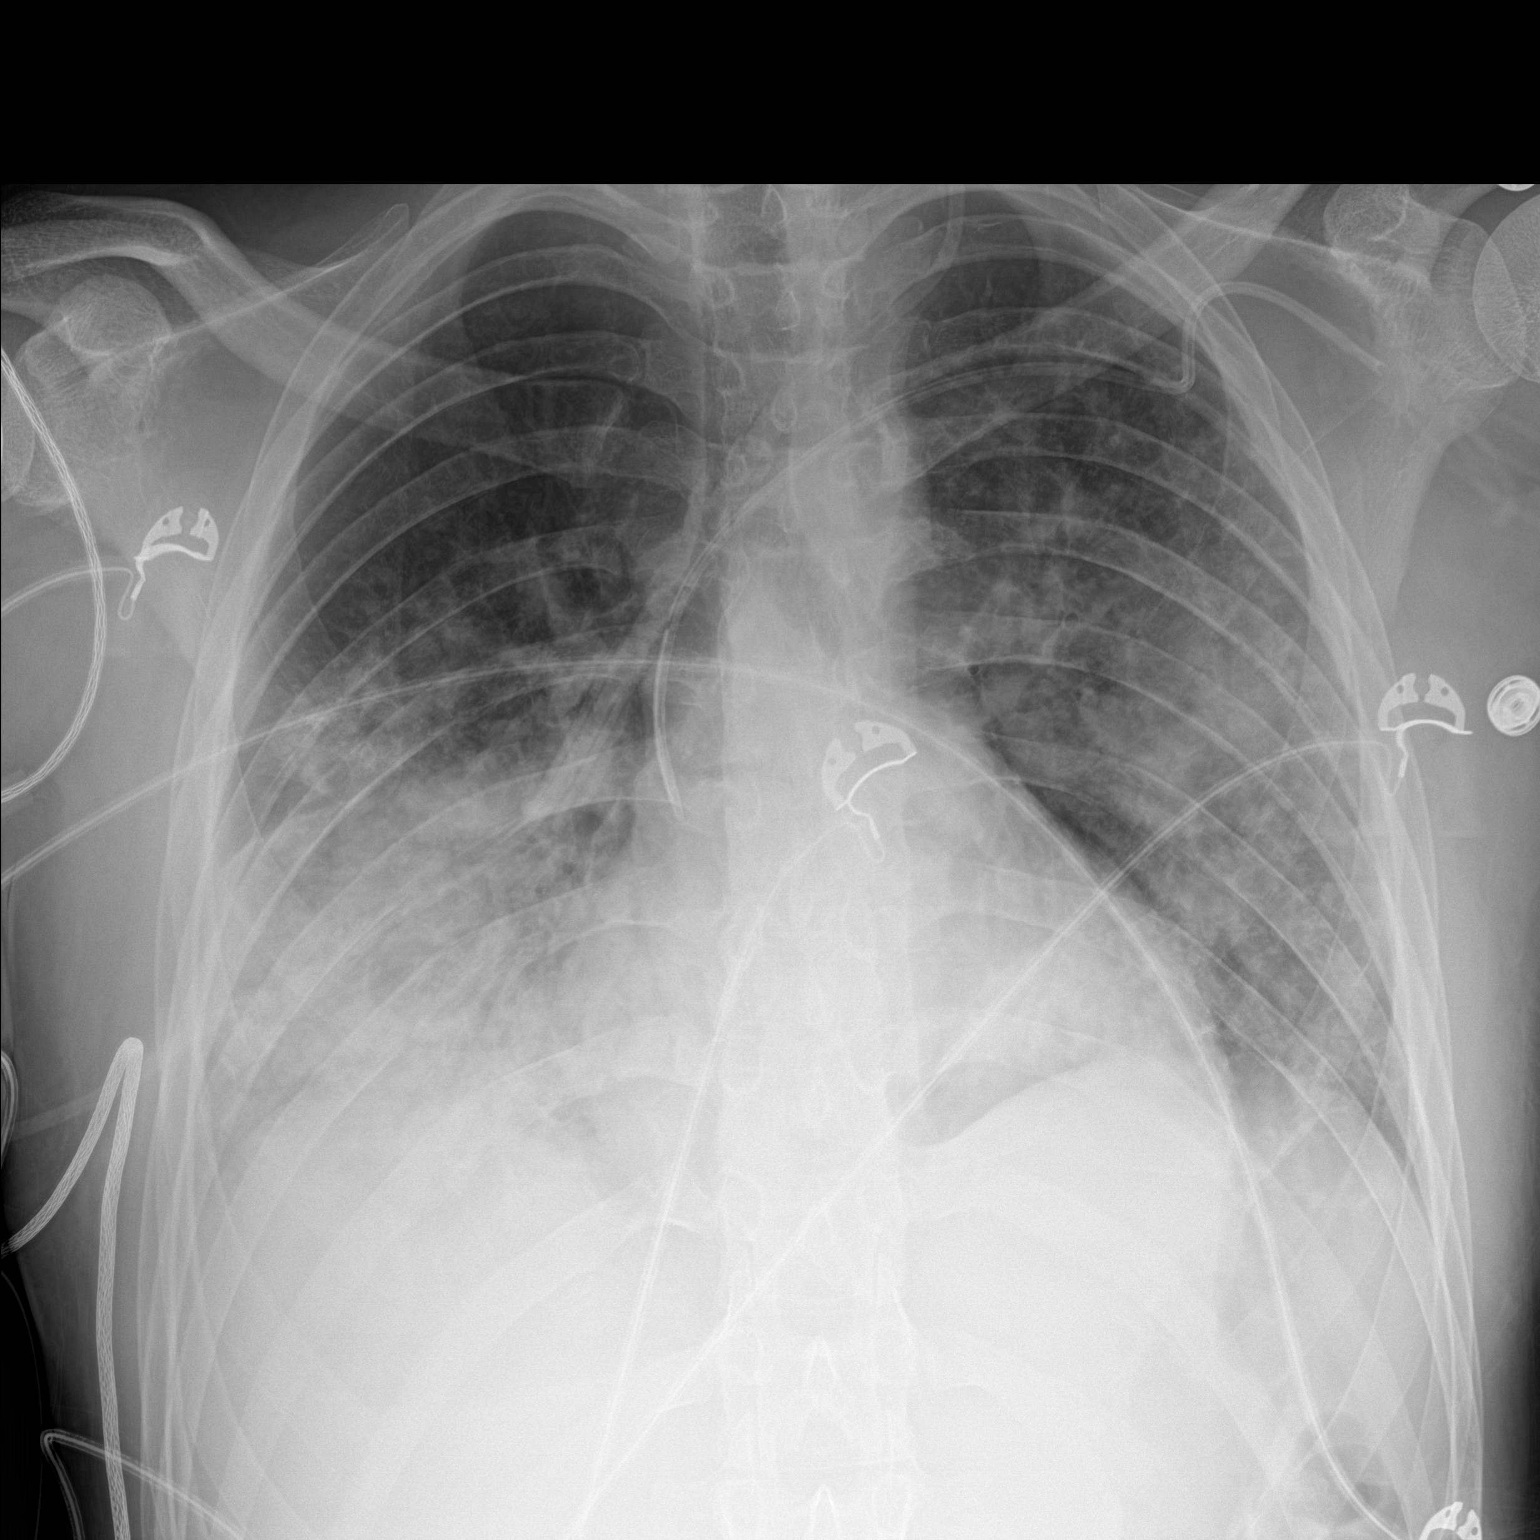

[1 of 1 positions shown; findings below may reference images not displayed]

FINDINGS: Left subclavian central line tip in the mid SVC. Interval extubation
and removal of enteric tube. Patchy and heterogeneous bilateral lung
opacities in a mid lower lung zone predominant distribution, with
minimal improved aeration of the right lung base from yesterday
otherwise unchanged. Unchanged heart size and mediastinal contours.
No pneumothorax.
IMPRESSION: 1. Interval extubation and removal of enteric tube.
2. Patchy and heterogeneous bilateral lung opacities with minimal
improved aeration of the right lung base from yesterday.

## 2019-07-04 MED ORDER — POTASSIUM PHOSPHATES 15 MMOLE/5ML IV SOLN
24.0000 mmol | Freq: Once | INTRAVENOUS | Status: AC
  Administered 2019-07-04: 24 mmol via INTRAVENOUS
  Filled 2019-07-04: qty 8

## 2019-07-04 MED ORDER — HYDROMORPHONE HCL 1 MG/ML IJ SOLN
INTRAMUSCULAR | Status: AC
  Filled 2019-07-04: qty 1

## 2019-07-04 MED ORDER — HYDROMORPHONE HCL 1 MG/ML IJ SOLN
0.5000 mg | INTRAMUSCULAR | Status: DC | PRN
  Administered 2019-07-06: 0.5 mg via INTRAVENOUS
  Filled 2019-07-04: qty 1

## 2019-07-04 MED ORDER — HYDROMORPHONE HCL 1 MG/ML IJ SOLN
1.0000 mg | INTRAMUSCULAR | Status: DC | PRN
  Administered 2019-07-04 – 2019-07-07 (×17): 1 mg via INTRAVENOUS
  Filled 2019-07-04 (×16): qty 1

## 2019-07-04 MED ORDER — MELATONIN 3 MG PO TABS
6.0000 mg | ORAL_TABLET | Freq: Every day | ORAL | Status: DC
  Administered 2019-07-04 – 2019-07-11 (×8): 6 mg via ORAL
  Filled 2019-07-04 (×10): qty 2

## 2019-07-04 MED ORDER — SODIUM CHLORIDE 0.9 % IV SOLN: INTRAVENOUS | Status: DC | PRN

## 2019-07-04 MED FILL — Thrombin For Soln Kit 20000 Unit: CUTANEOUS | Qty: 1 | Status: AC

## 2019-07-04 MED FILL — Thrombin For Soln 5000 Unit: CUTANEOUS | Qty: 5000 | Status: AC

## 2019-07-04 NOTE — Consult Note (Signed)
NAME:  Stephen Beard, MRN:  [id number], DOB:  28-Jul-1995, LOS: 4 ADMISSION DATE:  06/30/2019, CONSULTATION DATE: 07/03/2019 REFERRING MD: Dr. Zada Finders, CHIEF COMPLAINT: Status post hematoma evacuation  Brief History    Patient is a 24 year old admitted on the 18th with a new diagnosis of parietal and occipital glioma.  He underwent resection yesterday with postoperative hematoma requiring a trip to the operating room this evening for evacuation.  He seen post operatively in the neuro ICU.  [place] as he is heavily sedated on fentanyl and propofol mechanically ventilated.  He does withdraw to pain.  His pupils are 2 mm and not light responsive.  He does overbreathing the ventilator.  Laboratories are wholly unremarkable.  He is on Decadron.  Currently the patient is relatively hypoxemic on 100% FiO2 with O2 saturation 100% PEEP of 7.  His lungs are coarse in both lung fields.  Chest x-ray postoperatively is pending.  Past Medical History  ADHD on Adderall  Significant Hospital Events   07/02/2019: Section parietal-occipital glioma 07/03/2019: Postoperative hematoma evacuation  Consults:  PCCM  Procedures:  As above  Significant Diagnostic Tests:  Serial CT scans  Micro Data:  NA  Antimicrobials:  NA  Interim history/subjective:    07/04/2019  - in diprivan and fent gtt and pn PRVC - > wiriting notes. Wants extubation. -. Chagned from 70% fio2/pee 7 to 40% fio2/peep 5 and did well ->s tarted SBT off sedation  Objective   Blood pressure (!) 98/51, pulse 75, temperature 99.8 F (37.7 C), temperature source Axillary, resp. rate 18, height 5\' 7"  (1.702 m), weight 59 kg, SpO2 98 %.    Vent Mode: PRVC FiO2 (%):  [70 %-100 %] 70 % Set Rate:  [15 bmp-18 bmp] 18 bmp Vt Set:  [520 mL] 520 mL PEEP:  [5 cmH20-7 cmH20] 7 cmH20 Plateau Pressure:  [24 cmH20-30 cmH20] 27 cmH20   Intake/Output Summary (Last 24 hours) at 07/04/2019 1139 Last data filed at 07/04/2019 1054 Gross per 24  hour  Intake 5158.86 ml  Output 3537 ml  Net 1621.86 ml   Filed Weights   07/02/19 0725  Weight: 59 kg    Examination: General Appearance:  On vent. Writing notes.  Head:  Normocephalic, without obvious abnormality, atraumatic Eyes:  PERRL - yes, conjunctiva/corneas - muddy     Ears:  Normal external ear canals, both ears Nose:  G tube - no Throat:  ETT TUBE - yes , OG tube - no Neck:  Supple,  No enlargement/tenderness/nodules Lungs: Clear to auscultation bilaterally, Ventilator   Synchrony - yes Heart:  S1 and S2 normal, no murmur, CVP - no.  Pressors - no Abdomen:  Soft, no masses, no organomegaly Genitalia / Rectal:  Not done Extremities:  Extremities- intact Skin:  ntact in exposed areas . Sacral area - x Neurologic:  Sedation - fent gtt and diprivan -> RASS - -1 . Moves all 4s - yes. CAM-ICU - neg . Orientation - writing notes       Resolved Hospital Problem list   NA  Assessment & Plan:  1.  Status post resection of a parietal and occipital glioma, status post hematoma evacuation today: Plan per neurosurgery  2.  Postoperative mechanical ventilation with relative hypoxemia: Chest x-ray pending.  Possibly related to intraoperative volume versus neurogenic.  pH 7.377  3.  On Decadron    07/04/2019 - > does yes meet criteria for SBTin setting of Acute Respiratory Failure due to above issues  Plan  -  stop sedation gtt  - start PSV - extubae if does well   Low Phos   plan  - replete  Anemia of ICU  - 07/04/2019 -0 no bleed  Plan - - PRBC for hgb </= 6.9gm%    - exceptions are   -  if ACS susepcted/confirmed then transfuse for hgb </= 8.0gm%,  or    -  active bleeding with hemodynamic instability, then transfuse regardless of hemoglobin value   At at all times try to transfuse 1 unit prbc as possible with exception of active hemorrhage    Best practice:  Diet: N.p.o. for now Pain/Anxiety/Delirium protocol (if indicated): Propofol and fentanyl VAP  protocol (if indicated): Yes DVT prophylaxis: SCDs GI prophylaxis: Pepcid Glucose control: Yes Mobility: Bedrest Code Status: Full Family Communication: signifcant other at bedside  Disposition: ICU    ATTESTATION & SIGNATURE   The patient Stephen Beard is critically ill with multiple organ systems failure and requires high complexity decision making for assessment and support, frequent evaluation and titration of therapies, application of advanced monitoring technologies and extensive interpretation of multiple databases.   Critical Care Time devoted to patient care services described in this note is  30  Minutes. This time reflects time of care of this signee Dr Brand Males. This critical care time does not reflect procedure time, or teaching time or supervisory time of PA/NP/Med student/Med Resident etc but could involve care discussion time     Dr. Brand Males, M.D., [facility].C.P Pulmonary and Critical Care Medicine Staff Physician [facility] Pager: [phone], If no answer or between  15:00h - 7:00h: call [phone]  07/04/2019 11:39 AM    LABS    PULMONARY Recent Labs  Lab 07/02/19 1229 07/02/19 1513 07/02/19 1634 07/03/19 2117  PHART 7.411 7.409 7.377 7.274*  PCO2ART 36.2 33.3 37.3 45.4  PO2ART 362.0* 232.0* 257.0* 69*  HCO3 23.4 21.3 22.1 21.1  TCO2 25 22 23 22   O2SAT 100.0 100.0 100.0 91.0    CBC Recent Labs  Lab 06/30/19 2028 07/02/19 1229 07/03/19 0900 07/03/19 2117 07/04/19 0806  HGB 16.4   < > 10.5* 9.5* 9.0*  HCT 46.2   < > 30.7* 28.0* 27.0*  WBC 8.4  --  20.9*  --  11.6*  PLT 228  --  188  --  120*   < > = values in this interval not displayed.    COAGULATION Recent Labs  Lab 07/03/19 0900  INR 1.1    CARDIAC  No results for input(s): TROPONINI in the last 168 hours. No results for input(s): PROBNP in the last 168 hours.   CHEMISTRY Recent Labs  Lab 06/30/19 2028  07/02/19 1229 07/03/19 0900 07/03/19 0900 07/03/19 2029 07/03/19 2117 07/04/19 0229 07/04/19 0458 07/04/19 0806  NA 138   < > 136   < > 139 141 138 140 140  K 4.0   < > 3.5   < >  --  3.7  --  3.7  --   CL 104  --  101  --   --   --   --  111  --   CO2 22  --  24  --   --   --   --  22  --   GLUCOSE 102*  --  147*  --   --   --   --  120*  --   BUN 11  --  7  --   --   --   --  6  --   CREATININE 1.07  --  0.61  --   --   --   --  0.66  --   CALCIUM 9.3  --  8.5*  --   --   --   --  7.7*  --   PHOS  --   --  3.1  --   --   --   --  1.4*  --    < > = values in this interval not displayed.   Estimated Creatinine Clearance: 118.8 mL/min (by C-G formula based on SCr of 0.66 mg/dL).   LIVER Recent Labs  Lab 07/03/19 0900 07/04/19 0458  ALBUMIN 4.0 2.9*  INR 1.1  --      INFECTIOUS No results for input(s): LATICACIDVEN, PROCALCITON in the last 168 hours.   ENDOCRINE CBG (last 3)  Recent Labs    07/04/19 0002 07/04/19 0345 07/04/19 0823  GLUCAP 118* 125* 118*         IMAGING x48h  - image(s) personally visualized  -   highlighted in bold CT HEAD WO CONTRAST  Result Date: 07/03/2019 CLINICAL DATA:  Postop hematoma evacuation. EXAM: CT HEAD WITHOUT CONTRAST TECHNIQUE: Contiguous axial images were obtained from the base of the skull through the vertex without intravenous contrast. COMPARISON:  07/03/2019 at 5:37 p.m. FINDINGS: Brain: Sequelae of right parieto-occipital craniotomy for tumor resection are again identified. A hematoma in the medial right parietal operative bed is smaller following interval evacuation, now measuring 4.2 x 3.0 x 2.8 cm (AP x transverse x craniocaudal). Gas is noted along the operative tract in the medial right parieto-occipital region, and there is a new drain in place in this location. There is persistent extensive vasogenic edema in the posterior right cerebral hemisphere. Mass effect from the hematoma has decreased with leftward midline  shift now measuring 12 mm (previously 17 mm). The lateral ventricles are slightly smaller than on the prior study. There is a small amount of extra-axial blood along the falx in the right parietal region. There is also minimal extra-axial blood over the posterior right cerebral convexity underneath the craniotomy. There is persistent partial effacement of the basilar cisterns. There is no cerebellar tonsillar herniation. No sizable acute cortically based infarct is identified. Vascular: No hyperdense vessel. Skull: Right parieto-occipital craniotomy with small volume fluid and gas in the scalp. Sinuses/Orbits: Unremarkable orbits. Minimal mucosal thickening in the paranasal sinuses. Clear mastoid air cells. Other: None. IMPRESSION: Decreased size of right parietal hematoma status post evacuation and drain placement. Persistent extensive vasogenic edema in the posterior right cerebral hemisphere with decreased leftward midline shift, now 12 mm. Electronically Signed   By: Logan Bores M.D.   On: 07/03/2019 20:35   CT HEAD WO CONTRAST  Result Date: 07/03/2019 CLINICAL DATA:  Seizure. Abnormal neurological examination following surgery. Status post tumor resection yesterday and subsequent postoperative hematoma evacuation today. EXAM: CT HEAD WITHOUT CONTRAST TECHNIQUE: Contiguous axial images were obtained from the base of the skull through the vertex without intravenous contrast. COMPARISON:  Head MRI 07/03/2019 FINDINGS: Brain: Postoperative changes are again seen from right parieto-occipital craniotomy. A hyperattenuating hematoma in the medial right parietal operative bed measures 5.3 x 3.6 x 4.3 cm (AP x transverse x craniocaudal), mildly smaller than on today's MRI. Extensive edema in the posterior right cerebrum is similar to the prior MRI, as is leftward midline shift which measures 17 mm. Postoperative gas is  noted along the operative tract. The ventricles are unchanged in size with mild asymmetric  dilatation of the right temporal horn again noted. No sizable extra-axial fluid collection is present. There remains partial effacement of the basilar cisterns including the suprasellar cistern. There is no cerebellar tonsillar herniation. Vascular: No hyperdense vessel. Skull: Right parieto-occipital craniotomy with small volume fluid and gas in the scalp soft tissues and with skin staples in place. Sinuses/Orbits: Unremarkable orbits. Minimal mucosal thickening in the paranasal sinuses. Clear mastoid air cells. Other: None. IMPRESSION: Postoperative changes with residual hematoma in the right parietal operative bed, mildly smaller than on today's earlier MRI. Similar degree of edema and mass effect with 17 mm of midline shift. Electronically Signed   By: Logan Bores M.D.   On: 07/03/2019 19:12   MR BRAIN W WO CONTRAST  Result Date: 07/03/2019 CLINICAL DATA:  Follow-up craniotomy. 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 EXAM: MRI HEAD WITHOUT AND WITH CONTRAST TECHNIQUE: Multiplanar, multiecho pulse sequences of the brain and surrounding structures were obtained without and with intravenous contrast. CONTRAST:  58mL GADAVIST GADOBUTROL 1 MMOL/ML IV SOLN COMPARISON:  CT and MRI studies of 06/30/2019 and 07/01/2019. FINDINGS: Brain: Interval right parieto-occipital craniotomy for debulking of the large malignant-appearing mass with the epicenter in the deep right medial parietal lobe. The vast majority of the enhancing tumor has been resected. Residual material in the postoperative space could be a combination of residual nonenhancing tumor and packing material. There does appear to be some indistinct contrast enhancement particularly towards the inferior margin suggesting that some of this represents residual tumor. Surrounding vasogenic white matter edema appears quite similar. Sub falcine herniation appears similar, though right-to-left midline shift is decreased from 17 mm 15 or 16 mm. Slight increase in size of  the occipital horn and temporal horn of the right lateral ventricle could be due 2 diminished mass effect from the tumor debulking as well as some potential for localized trapping. No unexpected subdural collection or hemorrhagic complication. Vascular: Major vessels at the base of the brain show flow. Skull and upper cervical spine: Craniotomy changes as above. Sinuses/Orbits: Clear sinuses. Flattening of the posterior globes possibly related to chronic increased intracranial pressure Other: None IMPRESSION: Recent right parieto-occipital craniotomy for tumor debulking. The vast majority of the enhancing tumor has been resected. Residual material in the operative space could be a combination of poorly enhancing tumor, blood products and packing material. Vasogenic edema appears similar. Mass effect with sub falcine herniation appears similar. Slight increased prominence of the right occipital horn and temporal horn could be due to diminished mass-effect upon those structures or could possibly represent an element of trapping. The changes minor. Electronically Signed   By: Nelson Chimes M.D.   On: 07/03/2019 13:03   DG CHEST PORT 1 VIEW  Result Date: 07/03/2019 CLINICAL DATA:  Intubated, central line placement EXAM: PORTABLE CHEST 1 VIEW COMPARISON:  None. FINDINGS: Two frontal views of the chest demonstrate endotracheal tube overlying tracheal air column tip at level of thoracic inlet. Enteric catheter passes below diaphragm tip projects over gastric body. Left subclavian catheter tip projects over superior vena cava. Cardiac silhouette is unremarkable. There is bilateral airspace disease, with greatest consolidation in the right middle and right lower lobes. No large effusion or pneumothorax. IMPRESSION: 1. Support devices as above. 2. Multifocal bilateral airspace disease, with dense consolidation at the right base. Electronically Signed   By: Randa Ngo M.D.   On: 07/03/2019 22:44

## 2019-07-04 NOTE — Progress Notes (Addendum)
Patient remains on 3% NaCl at 75 mL/hr  Na this evening returned at 141 - goal 150 - 155 per notes earlier today.  Discussed with on-call NS to increase rate to 100 mL/hr  Communicated orders to RN - also verified that infusion is via central line F/U with Na in 6 hrs  Barth Kirks, PharmD, BCPS, BCCCP Clinical Pharmacist [phone]  Please check AMION for all [place] numbers  07/04/2019 8:59 PM

## 2019-07-04 NOTE — Progress Notes (Signed)
Neurosurgery Service Progress Note  Subjective: Taken back to OR x2 overnight for post-op hematoma, blown pupil, neurologically recovered well but having hypoxia on vent, CXR w/ R>L lower lobe infiltrates c/w likely aspiration during siezure  Objective: Vitals:   07/04/19 0600 07/04/19 0700 07/04/19 0800 07/04/19 0801  BP: (!) 99/48 (!) 109/59 (!) 114/59 (!) 136/54  Pulse: 80 93 79 78  Resp: _0 Temp:   99.8 F (37.7 C)   TempSrc:   Axillary   SpO2: 99% 100% 99% 100%  Weight:      Height:       Temp (24hrs), Avg:99.6 F (37.6 C), Min:99 F (37.2 C), Max:99.9 F (37.7 C)  CBC Latest Ref Rng & Units 07/03/2019 07/03/2019 07/02/2019  WBC 4.0 - 10.5 K/uL - 20.9(H) -  Hemoglobin 13.0 - 17.0 g/dL 9.5(L) 10.5(L) 9.9(L)  Hematocrit 39.0 - 52.0 % 28.0(L) 30.7(L) 29.3(L)  Platelets 150 - 400 K/uL - 188 -   BMP Latest Ref Rng & Units 07/04/2019 07/04/2019 07/04/2019  Glucose 70 - 99 mg/dL - 120(H) -  BUN 6 - 20 mg/dL - 6 -  Creatinine 0.61 - 1.24 mg/dL - 0.66 -  Sodium 135 - 145 mmol/L 140 140 138  Potassium 3.5 - 5.1 mmol/L - 3.7 -  Chloride 98 - 111 mmol/L - 111 -  CO2 22 - 32 mmol/L - 22 -  Calcium 8.9 - 10.3 mg/dL - 7.7(L) -    Intake/Output Summary (Last 24 hours) at 07/04/2019 0900 Last data filed at 07/04/2019 0845 Gross per 24 hour  Intake 5085.01 ml  Output 4587 ml  Net 498.01 ml    Current Facility-Administered Medications:  .  0.9 %  sodium chloride infusion (Manually program via Guardrails IV Fluids), , Intravenous, Once, Eligha Bridegroom, Immunologist .  0.9 %  sodium chloride infusion, , Intravenous, PRN, Shellia Cleverly, MD .  acetaminophen (TYLENOL) tablet 650 mg, 650 mg, Oral, Q6H PRN **OR** acetaminophen (TYLENOL) suppository 650 mg, 650 mg, Rectal, Q6H PRN, Judith Part, MD .  Ampicillin-Sulbactam (UNASYN) 3 g in sodium chloride 0.9 % 100 mL IVPB, 3 g, Intravenous, Q6H, Bertram Haddix, Joyice Faster, MD, Stopped at 07/04/19 0604 .  chlorhexidine gluconate (MEDLINE  KIT) (PERIDEX) 0.12 % solution 15 mL, 15 mL, Mouth Rinse, BID, Xzavier Swinger, Joyice Faster, MD, 15 mL at 07/04/19 0756 .  Chlorhexidine Gluconate Cloth 2 % PADS 6 each, 6 each, Topical, Daily, Judith Part, MD, 6 each at 07/02/19 1820 .  dexamethasone (DECADRON) injection 10 mg, 10 mg, Intravenous, Q6H, Ryshawn Sanzone, Joyice Faster, MD, 10 mg at 07/04/19 0533 .  fentaNYL (SUBLIMAZE) bolus via infusion 50 mcg, 50 mcg, Intravenous, Q15 min PRN, Zarius Furr A, MD .  fentaNYL 2534mg in NS 2561m(1081mml) infusion-PREMIX, 50-200 mcg/hr, Intravenous, Continuous, Lashun Mccants, ThoJoyice FasterD, Last Rate: 5 mL/hr at 07/03/19 2118, 50 mcg/hr at 07/03/19 2118 .  insulin aspart (novoLOG) injection 0-9 Units, 0-9 Units, Subcutaneous, Q4H, SimJennelle Human NP, 1 Units at 07/04/19 0352 .  ipratropium-albuterol (DUONEB) 0.5-2.5 (3) MG/3ML nebulizer solution 3 mL, 3 mL, Nebulization, Q4H PRN, SimJennelle Human NP .  magnesium sulfate IVPB 1 g 100 mL, 1 g, Intravenous, Once, Claus Silvestro A, MD .  MEDLINE mouth rinse, 15 mL, Mouth Rinse, 10 times per day, OstJudith PartD, 15 mL at 07/04/19 0600 .  metoCLOPramide (REGLAN) injection 10 mg, 10 mg, Intravenous, Q6H, Leovardo Thoman, ThoJoyice FasterD, 10 mg at 07/04/19 0533 .  nicotine (NICODERM CQ -  dosed in mg/24 hours) patch 21 mg, 21 mg, Transdermal, Daily, Judith Part, MD, 21 mg at 07/03/19 0959 .  ondansetron (ZOFRAN) tablet 4 mg, 4 mg, Oral, Q6H PRN **OR** ondansetron (ZOFRAN) injection 4 mg, 4 mg, Intravenous, Q6H PRN, Judith Part, MD, 4 mg at 07/03/19 1505 .  oxyCODONE (Oxy IR/ROXICODONE) immediate release tablet 10 mg, 10 mg, Oral, Q4H PRN, Judith Part, MD, 10 mg at 07/03/19 1339 .  pantoprazole (PROTONIX) injection 40 mg, 40 mg, Intravenous, Daily, Jennelle Human B, NP, 40 mg at 07/03/19 2324 .  propofol (DIPRIVAN) 1000 MG/100ML infusion, 5-80 mcg/kg/min, Intravenous, Titrated, Teliyah Royal A, MD, Last Rate: 12.6 mL/hr at 07/04/19 0845, 35  mcg/kg/min at 07/04/19 0845 .  senna-docusate (Senokot-S) tablet 1 tablet, 1 tablet, Oral, QHS PRN, Meyran, [name], NP .  sodium chloride (hypertonic) 3 % solution, , Intravenous, Continuous, Jeremih Dearmas, Joyice Faster, MD, Last Rate: 50 mL/hr at 07/04/19 0845, Rate Verify at 07/04/19 0845 .  sodium chloride flush (NS) 0.9 % injection 10-40 mL, 10-40 mL, Intracatheter, Q12H, Shellia Cleverly, MD, 30 mL at 07/03/19 2327 .  sodium chloride flush (NS) 0.9 % injection 10-40 mL, 10-40 mL, Intracatheter, PRN, Shellia Cleverly, MD   Physical Exam: Intubated, PERRL, eyes open spontaneously, FCx4 without preference, writing full sentences and answering questions appropriately  Assessment & Plan: 24 y.o. 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 4/20 s/p craniotomy for resection, 4/21 post-op MRI with good resection, resection bed hematoma, midline shift decreased but still 50m. 4/21 likely seizure, blown pupil, emergently taken to OR x2 for hematoma evacuation, post-op CTH w/ good evacuation, expected residual in deep cavity, 4/22 awake/FC on vent, b/l infiltrates on CXR likely 2/2 aspiration during seizure  -f/u CCM recs, neurologically doing well but defer extubation timing to CCM given continued PEEP/O2 requirements -path pending -continue high dose dex, 3% -can increase his sedation some to increase comfort on the vent -SCDs/TEDs, hold SQH  TMarcello MooresA Sora Olivo  07/04/19 9:00 AM

## 2019-07-04 NOTE — Progress Notes (Signed)
eLink Physician-Brief Progress Note Patient Name: ABRIEL SILVEY DOB: [birth year] MRN: [id number]   Date of Service  07/04/2019  HPI/Events of Note  Pt at risk for self extubation.  eICU Interventions  Bilateral soft wrist restraints  Ordered.        Kerry Kass Seretha Estabrooks 07/04/2019, 1:35 AM

## 2019-07-04 NOTE — Progress Notes (Signed)
Doing well post extubation But needing Face mask  Plan  - get cxr     SIGNATURE    Dr. Brand Males, M.D., F.C.C.P,  Pulmonary and Critical Care Medicine Staff Physician, [place] Director - Interstitial Lung Disease  Program  Pulmonary [place] at [place], [place], [address]  Pager: [phone], If no answer or between  15:00h - 7:00h: call [phone] Telephone: [phone]  6:45 PM 07/04/2019  '

## 2019-07-04 NOTE — Procedures (Addendum)
Extubation Procedure Note  Patient Details:   Name: Stephen Beard DOB: 1995-09-25 MRN: [id number]   Airway Documentation:    Vent end date: 07/04/19 Vent end time: 1331   Evaluation  O2 sats: currently acceptable Complications: No apparent complications Patient did tolerate procedure well. Bilateral Breath Sounds: Clear   Yes   RT extubated patient per MD order with RN at bedside. Positive cuff leak noted. Patient tolerated well and can speak. Patients sats dropped to the 80's after extubation and RT placed patient on a NRB. No stridor noted. Patient is currently in no distress and is sating 94% on NRB. RN called MD. MD aware with no new orders at this time. RT will continue to monitor.   [name] 07/04/2019, 1:54 PM

## 2019-07-04 NOTE — Progress Notes (Signed)
   Call from RN Doing well on SBT  Plan  - extubate  - reintubate if needed / gets worse     SIGNATURE    Dr. Brand Males, M.D., F.C.C.P,  Pulmonary and Critical Care Medicine Staff Physician, [place] Director - Interstitial Lung Disease  Program  Pulmonary [place] at [place], [place], [address]  Pager: [phone], If no answer or between  15:00h - 7:00h: call [phone] Telephone: [phone]  1:25 PM 07/04/2019

## 2019-07-05 ENCOUNTER — Inpatient Hospital Stay (HOSPITAL_COMMUNITY): Payer: Medicaid Other

## 2019-07-05 DIAGNOSIS — J9601 Acute respiratory failure with hypoxia: Secondary | ICD-10-CM

## 2019-07-05 DIAGNOSIS — R0602 Shortness of breath: Secondary | ICD-10-CM

## 2019-07-05 LAB — SODIUM
Sodium: 145 mmol/L (ref 135–145)
Sodium: 145 mmol/L (ref 135–145)
Sodium: 144 mmol/L (ref 135–145)
Sodium: 138 mmol/L (ref 135–145)

## 2019-07-05 LAB — COMPREHENSIVE METABOLIC PANEL
ALT: 18 U/L (ref 0–44)
AST: 33 U/L (ref 15–41)
Albumin: 3 g/dL — ABNORMAL LOW (ref 3.5–5.0)
Alkaline Phosphatase: 28 U/L — ABNORMAL LOW (ref 38–126)
Anion gap: 10 (ref 5–15)
BUN: 14 mg/dL (ref 6–20)
CO2: 23 mmol/L (ref 22–32)
Calcium: 8.5 mg/dL — ABNORMAL LOW (ref 8.9–10.3)
Chloride: 112 mmol/L — ABNORMAL HIGH (ref 98–111)
Creatinine, Ser: 0.73 mg/dL (ref 0.61–1.24)
GFR calc Af Amer: >60 mL/min (ref >60)
GFR calc non Af Amer: >60 mL/min (ref >60)
Glucose, Bld: 133 mg/dL — ABNORMAL HIGH (ref 70–99)
Potassium: 3.8 mmol/L (ref 3.5–5.1)
Sodium: 145 mmol/L (ref 135–145)
Total Bilirubin: 0.6 mg/dL (ref 0.3–1.2)
Total Protein: 5.5 g/dL — ABNORMAL LOW (ref 6.5–8.1)

## 2019-07-05 LAB — CBC WITH DIFFERENTIAL/PLATELET
Abs Immature Granulocytes: 0.1 10*3/uL — ABNORMAL HIGH (ref 0.00–0.07)
Basophils Absolute: 0 10*3/uL (ref 0.0–0.1)
Basophils Relative: 0 %
Eosinophils Absolute: 0 10*3/uL (ref 0.0–0.5)
Eosinophils Relative: 0 %
HCT: 28.6 % — ABNORMAL LOW (ref 39.0–52.0)
Hemoglobin: 9.9 g/dL — ABNORMAL LOW (ref 13.0–17.0)
Immature Granulocytes: 1 %
Lymphocytes Relative: 6 %
Lymphs Abs: 1 10*3/uL (ref 0.7–4.0)
MCH: 31.8 pg (ref 26.0–34.0)
MCHC: 34.6 g/dL (ref 30.0–36.0)
MCV: 92 fL (ref 80.0–100.0)
Monocytes Absolute: 1.2 10*3/uL — ABNORMAL HIGH (ref 0.1–1.0)
Monocytes Relative: 7 %
Neutro Abs: 14.5 10*3/uL — ABNORMAL HIGH (ref 1.7–7.7)
Neutrophils Relative %: 86 %
Platelets: 153 10*3/uL (ref 150–400)
RBC: 3.11 MIL/uL — ABNORMAL LOW (ref 4.22–5.81)
RDW: 14.3 % (ref 11.5–15.5)
WBC: 16.7 10*3/uL — ABNORMAL HIGH (ref 4.0–10.5)
nRBC: 0 % (ref 0.0–0.2)

## 2019-07-05 LAB — MAGNESIUM: Magnesium: 2.1 mg/dL (ref 1.7–2.4)

## 2019-07-05 LAB — ECHOCARDIOGRAM COMPLETE
Height: 67 in
Weight: 2081.14 oz

## 2019-07-05 LAB — GLUCOSE, CAPILLARY
Glucose-Capillary: 101 mg/dL — ABNORMAL HIGH (ref 70–99)
Glucose-Capillary: 112 mg/dL — ABNORMAL HIGH (ref 70–99)
Glucose-Capillary: 116 mg/dL — ABNORMAL HIGH (ref 70–99)
Glucose-Capillary: 129 mg/dL — ABNORMAL HIGH (ref 70–99)
Glucose-Capillary: 131 mg/dL — ABNORMAL HIGH (ref 70–99)
Glucose-Capillary: 169 mg/dL — ABNORMAL HIGH (ref 70–99)

## 2019-07-05 LAB — LACTIC ACID, PLASMA: Lactic Acid, Venous: 1.5 mmol/L (ref 0.5–1.9)

## 2019-07-05 LAB — PROCALCITONIN: Procalcitonin: 2.67 ng/mL

## 2019-07-05 LAB — PHOSPHORUS: Phosphorus: 2.5 mg/dL (ref 2.5–4.6)

## 2019-07-05 MED ORDER — BETHANECHOL CHLORIDE 10 MG PO TABS
10.0000 mg | ORAL_TABLET | Freq: Four times a day (QID) | ORAL | Status: DC
  Administered 2019-07-05 – 2019-07-12 (×20): 10 mg via ORAL
  Filled 2019-07-05 (×22): qty 1

## 2019-07-05 MED ORDER — POTASSIUM CHLORIDE CRYS ER 20 MEQ PO TBCR
40.0000 meq | EXTENDED_RELEASE_TABLET | Freq: Once | ORAL | Status: AC
  Administered 2019-07-05: 40 meq via ORAL
  Filled 2019-07-05: qty 2

## 2019-07-05 MED ORDER — DEXAMETHASONE SODIUM PHOSPHATE 4 MG/ML IJ SOLN
4.0000 mg | Freq: Four times a day (QID) | INTRAMUSCULAR | Status: DC
  Administered 2019-07-05 – 2019-07-08 (×12): 4 mg via INTRAVENOUS
  Filled 2019-07-05 (×12): qty 1

## 2019-07-05 NOTE — Progress Notes (Signed)
NAME:  Stephen Beard, MRN:  [id number], DOB:  1995/09/04, LOS: 5 ADMISSION DATE:  06/30/2019, CONSULTATION DATE: 07/03/2019 REFERRING MD: Dr. Zada Finders, CHIEF COMPLAINT: Status post hematoma evacuation  Brief History    Patient is a 24 year old admitted on the 18th with a new diagnosis of parietal and occipital glioma.  He underwent resection yesterday with postoperative hematoma requiring a trip to the operating room this evening for evacuation.  He seen post operatively in the neuro ICU.  [place] as he is heavily sedated on fentanyl and propofol mechanically ventilated.  He does withdraw to pain.  His pupils are 2 mm and not light responsive.  He does overbreathing the ventilator.  Laboratories are wholly unremarkable.  He is on Decadron.  Currently the patient is relatively hypoxemic on 100% FiO2 with O2 saturation 100% PEEP of 7.  His lungs are coarse in both lung fields.  Chest x-ray postoperatively is pending.  Past Medical History  ADHD on Adderall  Significant Hospital Events   07/02/2019: Section parietal-occipital glioma 07/03/2019: Postoperative hematoma evacuation 4/22 - extubated but hypoxemic needing face mask. CXR with significant infiltrates  Consults:  PCCM  Procedures:  As above  Significant Diagnostic Tests:  Serial CT scans  Micro Data:  NA  Antimicrobials:   BActriacisn 4/20 >> 420 Ancelf 4/20 >> 4/20  Unasyn 4/21 >>       Interim history/subjective:    07/05/2019  -remains extubated with good wob but still very hypoxemci needing face mask. Thirsty. On 3% saline. Headache less but still + per NSGY. Had fever to 103F yesterday. WBC up. On D3 unasyn  Objective   Blood pressure 109/60, pulse 72, temperature 98.5 F (36.9 C), temperature source Oral, resp. rate (!) 22, height 5\' 7"  (1.702 m), weight 59 kg, SpO2 91 %.    Vent Mode: CPAP;PSV FiO2 (%):  [40 %-100 %] 55 % PEEP:  [5 cmH20] 5 cmH20 Pressure Support:  [5 cmH20] 5 cmH20   Intake/Output  Summary (Last 24 hours) at 07/05/2019 0931 Last data filed at 07/05/2019 0836 Gross per 24 hour  Intake 2386.09 ml  Output 4061 ml  Net -1674.91 ml   Filed Weights   07/02/19 0725  Weight: 59 kg      General Appearance:  Looks better. On face mask o2 Head:  Normocephalic, without obvious abnormality, atraumatic Eyes:  PERRL - yes, conjunctiva/corneas - muddy     Ears:  Normal external ear canals, both ears Nose:  G tube - no Throat:  ETT TUBE - no , OG tube - no. FAce mask o2 Neck:  Supple,  No enlargement/tenderness/nodules Lungs: Clear to auscultation bilaterally but at bases with crackles Heart:  S1 and S2 normal, no murmur, CVP - no.  Pressors - no Abdomen:  Soft, no masses, no organomegaly Genitalia / Rectal:  Not done Extremities:  Extremities- intact Skin:  ntact in exposed areas . Neurologic:  Sedation - none -> RASS - +1 . Moves all 4s - yes. CAM-ICU - neg . Orientation - x3+      Resolved Hospital Problem list   Thrombocytopenia on 4/23  Assessment & Plan:  Status post resection of a parietal and occipital glioma, status post hematoma evacuation today:   07/05/2019 - headache +  Plan  - per neurosurgery - all below - decadron per NSGY  - 3% saline per NSGY  - get echo rule out volume overload  - nicotine patch - melatonin for sleep  - opioids for had  Acute  hypoxemic resp failure due to infiltrates - intubated 4/21 - extubated 4/22   07/05/2019 - > remains extubated but needing face mask. Etiology: volume v infection v atlectasis v some combo of these  Plan  - face mask - pulm toilet   Concern for sepsis due to pneumonia NOS  - 07/05/2019 - febrile yesterday 103F with high WBC  Plan - continue unasyn  - check urine strep and urine leg - check lactate  - check procal - get echo rule out volume overload   Anemia of ICU  - 07/05/2019 - no bleed and btter  Plan - - PRBC for hgb </= 6.9gm%    - exceptions are   -  if ACS  susepcted/confirmed then transfuse for hgb </= 8.0gm%,  or    -  active bleeding with hemodynamic instability, then transfuse regardless of hemoglobin value   At at all times try to transfuse 1 unit prbc as possible with exception of active hemorrhage    Best practice:  Diet: N.p.o. for now Pain/Anxiety/Delirium protocol (if indicated): none VAP protocol (if indicated): Yes DVT prophylaxis: SCDs -> change to  lovenox per NSGY timing GI prophylaxis: Protonix with steroids Glucose control: Yes Mobility: Bedrest Code Status: Full Family Communication: patient at bedside Disposition: ICU     ATTESTATION & SIGNATURE   The patient Stephen Beard is critically ill with multiple organ systems failure and requires high complexity decision making for assessment and support, frequent evaluation and titration of therapies, application of advanced monitoring technologies and extensive interpretation of multiple databases.   Critical Care Time devoted to patient care services described in this note is  31  Minutes. This time reflects time of care of this signee Dr Brand Males. This critical care time does not reflect procedure time, or teaching time or supervisory time of PA/NP/Med student/Med Resident etc but could involve care discussion time     Dr. Brand Males, M.D., [facility].C.P Pulmonary and Critical Care Medicine Staff Physician [facility] Pager: [phone], If no answer or between  15:00h - 7:00h: call [phone]  07/05/2019 9:41 AM      LABS    PULMONARY Recent Labs  Lab 07/02/19 1229 07/02/19 1513 07/02/19 1634 07/03/19 2117  PHART 7.411 7.409 7.377 7.274*  PCO2ART 36.2 33.3 37.3 45.4  PO2ART 362.0* 232.0* 257.0* 69*  HCO3 23.4 21.3 22.1 21.1  TCO2 25 22 23 22   O2SAT 100.0 100.0 100.0 91.0    CBC Recent Labs  Lab 07/03/19 0900 07/03/19 0900 07/03/19 2117 07/04/19 0806 07/05/19 0422  HGB 10.5*    < > 9.5* 9.0* 9.9*  HCT 30.7*   < > 28.0* 27.0* 28.6*  WBC 20.9*  --   --  11.6* 16.7*  PLT 188  --   --  120* 153   < > = values in this interval not displayed.    COAGULATION Recent Labs  Lab 07/03/19 0900  INR 1.1    CARDIAC  No results for input(s): TROPONINI in the last 168 hours. No results for input(s): PROBNP in the last 168 hours.   CHEMISTRY Recent Labs  Lab 06/30/19 2028 07/02/19 1229 07/03/19 0900 07/03/19 2029 07/04/19 0458 07/04/19 0806 07/04/19 1429 07/04/19 1922 07/05/19 0245 07/05/19 0422 07/05/19 0811  NA 138   < > 136   < > 140   < > 139 141 145 145 145  K 4.0   < > 3.5   < >  3.7  --   --   --   --  3.8  --   CL 104  --  101  --  111  --   --   --   --  112*  --   CO2 22  --  24  --  22  --   --   --   --  23  --   GLUCOSE 102*  --  147*  --  120*  --   --   --   --  133*  --   BUN 11  --  7  --  6  --   --   --   --  14  --   CREATININE 1.07  --  0.61  --  0.66  --   --   --   --  0.73  --   CALCIUM 9.3  --  8.5*  --  7.7*  --   --   --   --  8.5*  --   MG  --   --   --   --   --   --   --   --   --  2.1  --   PHOS  --   --  3.1  --  1.4*  --   --   --   --  2.5  --    < > = values in this interval not displayed.   Estimated Creatinine Clearance: 118.8 mL/min (by C-G formula based on SCr of 0.73 mg/dL).   LIVER Recent Labs  Lab 07/03/19 0900 07/04/19 0458 07/05/19 0422  AST  --   --  33  ALT  --   --  18  ALKPHOS  --   --  28*  BILITOT  --   --  0.6  PROT  --   --  5.5*  ALBUMIN 4.0 2.9* 3.0*  INR 1.1  --   --      INFECTIOUS No results for input(s): LATICACIDVEN, PROCALCITON in the last 168 hours.   ENDOCRINE CBG (last 3)  Recent Labs    07/04/19 2321 07/05/19 0332 07/05/19 0816  GLUCAP 119* 131* 129*         IMAGING x48h  - image(s) personally visualized  -   highlighted in bold CT HEAD WO CONTRAST  Result Date: 07/03/2019 CLINICAL DATA:  Postop hematoma evacuation. EXAM: CT HEAD WITHOUT CONTRAST TECHNIQUE:  Contiguous axial images were obtained from the base of the skull through the vertex without intravenous contrast. COMPARISON:  07/03/2019 at 5:37 p.m. FINDINGS: Brain: Sequelae of right parieto-occipital craniotomy for tumor resection are again identified. A hematoma in the medial right parietal operative bed is smaller following interval evacuation, now measuring 4.2 x 3.0 x 2.8 cm (AP x transverse x craniocaudal). Gas is noted along the operative tract in the medial right parieto-occipital region, and there is a new drain in place in this location. There is persistent extensive vasogenic edema in the posterior right cerebral hemisphere. Mass effect from the hematoma has decreased with leftward midline shift now measuring 12 mm (previously 17 mm). The lateral ventricles are slightly smaller than on the prior study. There is a small amount of extra-axial blood along the falx in the right parietal region. There is also minimal extra-axial blood over the posterior right cerebral convexity underneath the craniotomy. There is persistent partial effacement of the basilar cisterns. There is no cerebellar tonsillar herniation. No sizable acute cortically  based infarct is identified. Vascular: No hyperdense vessel. Skull: Right parieto-occipital craniotomy with small volume fluid and gas in the scalp. Sinuses/Orbits: Unremarkable orbits. Minimal mucosal thickening in the paranasal sinuses. Clear mastoid air cells. Other: None. IMPRESSION: Decreased size of right parietal hematoma status post evacuation and drain placement. Persistent extensive vasogenic edema in the posterior right cerebral hemisphere with decreased leftward midline shift, now 12 mm. Electronically Signed   By: Logan Bores M.D.   On: 07/03/2019 20:35   CT HEAD WO CONTRAST  Result Date: 07/03/2019 CLINICAL DATA:  Seizure. Abnormal neurological examination following surgery. Status post tumor resection yesterday and subsequent postoperative hematoma  evacuation today. EXAM: CT HEAD WITHOUT CONTRAST TECHNIQUE: Contiguous axial images were obtained from the base of the skull through the vertex without intravenous contrast. COMPARISON:  Head MRI 07/03/2019 FINDINGS: Brain: Postoperative changes are again seen from right parieto-occipital craniotomy. A hyperattenuating hematoma in the medial right parietal operative bed measures 5.3 x 3.6 x 4.3 cm (AP x transverse x craniocaudal), mildly smaller than on today's MRI. Extensive edema in the posterior right cerebrum is similar to the prior MRI, as is leftward midline shift which measures 17 mm. Postoperative gas is noted along the operative tract. The ventricles are unchanged in size with mild asymmetric dilatation of the right temporal horn again noted. No sizable extra-axial fluid collection is present. There remains partial effacement of the basilar cisterns including the suprasellar cistern. There is no cerebellar tonsillar herniation. Vascular: No hyperdense vessel. Skull: Right parieto-occipital craniotomy with small volume fluid and gas in the scalp soft tissues and with skin staples in place. Sinuses/Orbits: Unremarkable orbits. Minimal mucosal thickening in the paranasal sinuses. Clear mastoid air cells. Other: None. IMPRESSION: Postoperative changes with residual hematoma in the right parietal operative bed, mildly smaller than on today's earlier MRI. Similar degree of edema and mass effect with 17 mm of midline shift. Electronically Signed   By: Logan Bores M.D.   On: 07/03/2019 19:12   MR BRAIN W WO CONTRAST  Result Date: 07/03/2019 CLINICAL DATA:  Follow-up craniotomy. 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 EXAM: MRI HEAD WITHOUT AND WITH CONTRAST TECHNIQUE: Multiplanar, multiecho pulse sequences of the brain and surrounding structures were obtained without and with intravenous contrast. CONTRAST:  23mL GADAVIST GADOBUTROL 1 MMOL/ML IV SOLN COMPARISON:  CT and MRI studies of 06/30/2019 and 07/01/2019.  FINDINGS: Brain: Interval right parieto-occipital craniotomy for debulking of the large malignant-appearing mass with the epicenter in the deep right medial parietal lobe. The vast majority of the enhancing tumor has been resected. Residual material in the postoperative space could be a combination of residual nonenhancing tumor and packing material. There does appear to be some indistinct contrast enhancement particularly towards the inferior margin suggesting that some of this represents residual tumor. Surrounding vasogenic white matter edema appears quite similar. Sub falcine herniation appears similar, though right-to-left midline shift is decreased from 17 mm 15 or 16 mm. Slight increase in size of the occipital horn and temporal horn of the right lateral ventricle could be due 2 diminished mass effect from the tumor debulking as well as some potential for localized trapping. No unexpected subdural collection or hemorrhagic complication. Vascular: Major vessels at the base of the brain show flow. Skull and upper cervical spine: Craniotomy changes as above. Sinuses/Orbits: Clear sinuses. Flattening of the posterior globes possibly related to chronic increased intracranial pressure Other: None IMPRESSION: Recent right parieto-occipital craniotomy for tumor debulking. The vast majority of the enhancing tumor has been resected.  Residual material in the operative space could be a combination of poorly enhancing tumor, blood products and packing material. Vasogenic edema appears similar. Mass effect with sub falcine herniation appears similar. Slight increased prominence of the right occipital horn and temporal horn could be due to diminished mass-effect upon those structures or could possibly represent an element of trapping. The changes minor. Electronically Signed   By: Nelson Chimes M.D.   On: 07/03/2019 13:03   DG CHEST PORT 1 VIEW  Result Date: 07/04/2019 CLINICAL DATA:  Acute respiratory failure with  hypoxia. EXAM: PORTABLE CHEST 1 VIEW COMPARISON:  Radiograph yesterday. FINDINGS: Left subclavian central line tip in the mid SVC. Interval extubation and removal of enteric tube. Patchy and heterogeneous bilateral lung opacities in a mid lower lung zone predominant distribution, with minimal improved aeration of the right lung base from yesterday otherwise unchanged. Unchanged heart size and mediastinal contours. No pneumothorax. IMPRESSION: 1. Interval extubation and removal of enteric tube. 2. Patchy and heterogeneous bilateral lung opacities with minimal improved aeration of the right lung base from yesterday. Electronically Signed   By: Keith Rake M.D.   On: 07/04/2019 20:05   DG CHEST PORT 1 VIEW  Result Date: 07/03/2019 CLINICAL DATA:  Intubated, central line placement EXAM: PORTABLE CHEST 1 VIEW COMPARISON:  None. FINDINGS: Two frontal views of the chest demonstrate endotracheal tube overlying tracheal air column tip at level of thoracic inlet. Enteric catheter passes below diaphragm tip projects over gastric body. Left subclavian catheter tip projects over superior vena cava. Cardiac silhouette is unremarkable. There is bilateral airspace disease, with greatest consolidation in the right middle and right lower lobes. No large effusion or pneumothorax. IMPRESSION: 1. Support devices as above. 2. Multifocal bilateral airspace disease, with dense consolidation at the right base. Electronically Signed   By: Randa Ngo M.D.   On: 07/03/2019 22:44

## 2019-07-05 NOTE — Progress Notes (Signed)
Neurosurgery Service Progress Note  Subjective: NAE ON, extubated yesterday, requiring venturi mask for supplemental O2, still has a headache but he reports it's significantly less severe than prior to hematoma evacuation, thirsty - presumably from 3%  Objective: Vitals:   07/05/19 0500 07/05/19 0700 07/05/19 0744 07/05/19 0800  BP: 116/63 (!) 98/51  109/60  Pulse: 98 (!) 59 82 72  Resp: (!) 28 19 (!) 29 (!) 22  Temp:    98.5 F (36.9 C)  TempSrc:    Oral  SpO2: 96% 90% (!) 89% 91%  Weight:      Height:       Temp (24hrs), Avg:99.6 F (37.6 C), Min:98.5 F (36.9 C), Max:102.6 F (39.2 C)  CBC Latest Ref Rng & Units 07/05/2019 07/04/2019 07/03/2019  WBC 4.0 - 10.5 K/uL 16.7(H) 11.6(H) -  Hemoglobin 13.0 - 17.0 g/dL 9.9(L) 9.0(L) 9.5(L)  Hematocrit 39.0 - 52.0 % 28.6(L) 27.0(L) 28.0(L)  Platelets 150 - 400 K/uL 153 120(L) -   BMP Latest Ref Rng & Units 07/05/2019 07/05/2019 07/04/2019  Glucose 70 - 99 mg/dL 133(H) - -  BUN 6 - 20 mg/dL 14 - -  Creatinine 0.61 - 1.24 mg/dL 0.73 - -  Sodium 135 - 145 mmol/L 145 145 141  Potassium 3.5 - 5.1 mmol/L 3.8 - -  Chloride 98 - 111 mmol/L 112(H) - -  CO2 22 - 32 mmol/L 23 - -  Calcium 8.9 - 10.3 mg/dL 8.5(L) - -    Intake/Output Summary (Last 24 hours) at 07/05/2019 0900 Last data filed at 07/05/2019 0836 Gross per 24 hour  Intake 2386.09 ml  Output 4061 ml  Net -1674.91 ml    Current Facility-Administered Medications:  .  0.9 %  sodium chloride infusion (Manually program via Guardrails IV Fluids), , Intravenous, Once, Eligha Bridegroom, Immunologist .  0.9 %  sodium chloride infusion, , Intravenous, PRN, Shellia Cleverly, MD .  acetaminophen (TYLENOL) tablet 650 mg, 650 mg, Oral, Q6H PRN, 650 mg at 07/04/19 1817 **OR** acetaminophen (TYLENOL) suppository 650 mg, 650 mg, Rectal, Q6H PRN, Judith Part, MD .  Ampicillin-Sulbactam (UNASYN) 3 g in sodium chloride 0.9 % 100 mL IVPB, 3 g, Intravenous, Q6H, Daruis Swaim A, MD, Last Rate: 200  mL/hr at 07/05/19 0517, 3 g at 07/05/19 0517 .  Chlorhexidine Gluconate Cloth 2 % PADS 6 each, 6 each, Topical, Daily, Judith Part, MD, 6 each at 07/04/19 [phone] .  dexamethasone (DECADRON) injection 10 mg, 10 mg, Intravenous, Q6H, Delonna Ney, Joyice Faster, MD, 10 mg at 07/05/19 0501 .  HYDROmorphone (DILAUDID) injection 0.5 mg, 0.5 mg, Intravenous, Q2H PRN, Itxel Wickard A, MD .  HYDROmorphone (DILAUDID) injection 1 mg, 1 mg, Intravenous, Q2H PRN, Judith Part, MD, 1 mg at 07/05/19 0744 .  insulin aspart (novoLOG) injection 0-9 Units, 0-9 Units, Subcutaneous, Q4H, Jennelle Human B, NP, 1 Units at 07/05/19 0451 .  ipratropium-albuterol (DUONEB) 0.5-2.5 (3) MG/3ML nebulizer solution 3 mL, 3 mL, Nebulization, Q4H PRN, Simpson, Paula B, NP .  magnesium sulfate IVPB 1 g 100 mL, 1 g, Intravenous, Once, Amorette Charrette A, MD .  melatonin tablet 6 mg, 6 mg, Oral, QHS, Vallarie Mare, MD, 6 mg at 07/04/19 2349 .  metoCLOPramide (REGLAN) injection 10 mg, 10 mg, Intravenous, Q6H, Veleta Yamamoto, Joyice Faster, MD, 10 mg at 07/05/19 0507 .  nicotine (NICODERM CQ - dosed in mg/24 hours) patch 21 mg, 21 mg, Transdermal, Daily, Fotios Amos, Joyice Faster, MD, 21 mg at 07/04/19 1213 .  ondansetron (  ZOFRAN) tablet 4 mg, 4 mg, Oral, Q6H PRN **OR** ondansetron (ZOFRAN) injection 4 mg, 4 mg, Intravenous, Q6H PRN, Judith Part, MD, 4 mg at 07/04/19 2346 .  oxyCODONE (Oxy IR/ROXICODONE) immediate release tablet 10 mg, 10 mg, Oral, Q4H PRN, Judith Part, MD, 10 mg at 07/03/19 1339 .  pantoprazole (PROTONIX) injection 40 mg, 40 mg, Intravenous, Daily, Jennelle Human B, NP, 40 mg at 07/04/19 0936 .  senna-docusate (Senokot-S) tablet 1 tablet, 1 tablet, Oral, QHS PRN, Meyran, [name], NP .  sodium chloride (hypertonic) 3 % solution, , Intravenous, Continuous, Vallarie Mare, MD, Last Rate: 100 mL/hr at 07/05/19 0500, Rate Verify at 07/05/19 0500 .  sodium chloride flush (NS) 0.9 % injection 10-40  mL, 10-40 mL, Intracatheter, Q12H, Shellia Cleverly, MD, 10 mL at 07/04/19 2120 .  sodium chloride flush (NS) 0.9 % injection 10-40 mL, 10-40 mL, Intracatheter, PRN, Shellia Cleverly, MD   Physical Exam: Awake/alert, Ox3, PERRL, gaze neutral, +dense L field cut, FCx4 with full strength Incision c/d/i Drain w/ serosang output  Assessment & Plan: 24 y.o. 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 4/20 s/p craniotomy for resection, 4/21 post-op MRI with good resection, resection bed hematoma, midline shift decreased but still 8mm. 4/21 likely seizure, blown pupil, emergently taken to OR x2 for hematoma evacuation, post-op CTH w/ good evacuation, expected residual in deep cavity, 4/22 awake/FC on vent, b/l infiltrates on CXR likely 2/2 aspiration during seizure, 4/22 extubated, final path   -CCM recs, CXR as expected given pneumonitis -final path anaplastic astro, will discuss with patient & family once he is out of the ICU -can start weaning dexamethasone, given pulmonary issues, 10q6 to 4q6 today, can go to 4bid tomorrow -3% rate inc'd, finally starting to increment -will need to stay in the unit until his O2 requirements decrease, he is neurologically stable, and 3% is therapeutic for 24h -SCDs/TEDs, hold SQH until POD5 given post-op hemorrhage  Marcello Moores A Matthews Franks  07/05/19 9:00 AM

## 2019-07-05 NOTE — Consult Note (Signed)
Physical Medicine and Rehabilitation Consult Reason for Consult: Progressive weakness with headache Referring Physician: Dr. Venetia Constable   HPI: Stephen Beard is a 24 y.o. right-handed male with history of ADHD, tobacco abuse.  Per chart review lives with significant other.  One level apartment.  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  Mother lives next door and works during the day.  Presented July 02, 2019 with progressive headaches and weakness as well as blurred vision left eye.  CT and imaging revealed two large masses in the right parieto-occipital lobe with surrounding vasogenic edema and a 21 mm of right to left shift no hydrocephalus.  MRI showed enhancing masses of right occipital lobe causing leftward midline shift and mass-effect on the right lateral ventricle.  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 Venetia Constable with postoperative hematoma requiring hematoma evacuation July 03, 2019.  Decadron protocol as indicated.  Postoperative acute respiratory failure with hypoxia chest x-ray suspect aspiration pneumonia and placed on Unasyn.  Presently on a clear liquid diet.  Therapy evaluations completed with recommendations of physical medicine rehab consult.   Review of Systems  Constitutional: Negative for fever.  HENT: Negative for hearing loss.   Eyes: Positive for blurred vision.  Respiratory: Negative for cough and shortness of breath.   Cardiovascular: Negative for chest pain, palpitations and leg swelling.  Gastrointestinal: Positive for constipation. Negative for heartburn and nausea.  Genitourinary: Negative for dysuria, flank pain and hematuria.  Musculoskeletal: Positive for myalgias.  Skin: Negative for rash.  Neurological: Positive for dizziness, weakness and headaches.  All other systems reviewed and are negative.  Past Medical History:  Diagnosis Date  . ADHD    Past Surgical History:   Procedure Laterality Date  . APPLICATION OF CRANIAL NAVIGATION N/A 07/02/2019   Procedure: APPLICATION OF CRANIAL NAVIGATION;  Surgeon: Judith Part, MD;  Location: [facility];  Service: Neurosurgery;  Laterality: N/A;  . CRANIOTOMY Right 07/02/2019   Procedure: CRANIOTOMY TUMOR EXCISION with Lucky Rathke;  Surgeon: Judith Part, MD;  Location: [place];  Service: Neurosurgery;  Laterality: Right;  posterior  . CRANIOTOMY N/A 07/03/2019   Procedure: CRANIOTOMY HEMATOMA EVACUATION SUBDURAL;  Surgeon: Judith Part, MD;  Location: [place];  Service: Neurosurgery;  Laterality: N/A;  . CRANIOTOMY Right 07/03/2019   Procedure: CRANIOTOMY HEMATOMA EVACUATION SUBDURAL;  Surgeon: Judith Part, MD;  Location: [place];  Service: Neurosurgery;  Laterality: Right;   History reviewed. No pertinent family history. Social History:  reports that he has been smoking. He does not have any smokeless tobacco history on file. He reports current alcohol use. He reports that he does not use drugs. Allergies: No Known Allergies Medications Prior to Admission  Medication Sig Dispense Refill  . amphetamine-dextroamphetamine (ADDERALL) 20 MG tablet Take 20 mg by mouth 2 (two) times daily.      Home: Home Living Family/patient expects to be discharged to:: Private residence Living Arrangements: Spouse/significant other(boyfriend) Available Help at Discharge: Family, Available 24 hours/day(mom/boyfriend) Type of Home: Apartment Home Access: Level entry Home Layout: One level Bathroom Shower/Tub: Chiropodist: Standard Bathroom Accessibility: No Home Equipment: None  Functional History: Prior Function Level of Independence: Independent Comments: working as Brewing technologist Status:  Mobility: Bed Mobility Overal bed mobility: Needs Assistance Bed Mobility: Supine to Sit Supine to sit: Min guard Transfers Overall transfer level: Needs assistance Equipment used: 2  person hand held assist Transfers: Sit  to/from Stand Sit to Stand: Mod assist, +2 physical assistance Ambulation/Gait Ambulation/Gait assistance: Min assist, +2 physical assistance Gait Distance (Feet): 3 Feet Assistive device: 2 person hand held assist Gait Pattern/deviations: Step-to pattern, Drifts right/left General Gait Details: pt with increased sway requiring BUE support to stabilize during mobility Gait velocity: reduced Gait velocity interpretation: <1.31 ft/sec, indicative of household ambulator    ADL:    Cognition: Cognition Overall Cognitive Status: Impaired/Different from baseline Orientation Level: Oriented X4 Cognition Arousal/Alertness: Awake/alert Behavior During Therapy: Impulsive Overall Cognitive Status: Impaired/Different from baseline Area of Impairment: Attention, Memory, Following commands, Safety/judgement, Awareness, Problem solving Current Attention Level: Sustained(easily distractible) Memory: Decreased recall of precautions, Decreased short-term memory Following Commands: Follows one step commands consistently(requires verbal cues) Safety/Judgement: Decreased awareness of safety, Decreased awareness of deficits Awareness: Intellectual Problem Solving: Slow processing, Requires verbal cues  Blood pressure 119/64, pulse 71, temperature 99.8 F (37.7 C), temperature source Oral, resp. rate (!) 24, height 5\' 7"  (1.702 m), weight 59 kg, SpO2 90 %. Physical Exam  Constitutional: No distress.  HENT:  Craniotomy site clean and dry  Cardiovascular: Normal rate.  Respiratory: Effort normal.  Musculoskeletal:     Cervical back: Normal range of motion.  Neurological:  Patient is alert but keeps his eyes closed during exam.  Provides his name age and date of birth.  Follows commands.  Fair awareness of deficits. Left field cut. Moves all 4  Skin: Skin is warm.    Results for orders placed or performed during the hospital encounter of 06/30/19 (from the  past 24 hour(s))  Sodium     Status: None   Collection Time: 07/04/19  2:29 PM  Result Value Ref Range   Sodium 139 135 - 145 mmol/L  Glucose, capillary     Status: Abnormal   Collection Time: 07/04/19  3:53 PM  Result Value Ref Range   Glucose-Capillary 108 (H) 70 - 99 mg/dL  Sodium     Status: None   Collection Time: 07/04/19  7:22 PM  Result Value Ref Range   Sodium 141 135 - 145 mmol/L  Glucose, capillary     Status: Abnormal   Collection Time: 07/04/19  7:26 PM  Result Value Ref Range   Glucose-Capillary 126 (H) 70 - 99 mg/dL  Glucose, capillary     Status: Abnormal   Collection Time: 07/04/19 11:21 PM  Result Value Ref Range   Glucose-Capillary 119 (H) 70 - 99 mg/dL  Sodium     Status: None   Collection Time: 07/05/19  2:45 AM  Result Value Ref Range   Sodium 145 135 - 145 mmol/L  Glucose, capillary     Status: Abnormal   Collection Time: 07/05/19  3:32 AM  Result Value Ref Range   Glucose-Capillary 131 (H) 70 - 99 mg/dL  Magnesium     Status: None   Collection Time: 07/05/19  4:22 AM  Result Value Ref Range   Magnesium 2.1 1.7 - 2.4 mg/dL  Phosphorus     Status: None   Collection Time: 07/05/19  4:22 AM  Result Value Ref Range   Phosphorus 2.5 2.5 - 4.6 mg/dL  CBC with Differential/Platelet     Status: Abnormal   Collection Time: 07/05/19  4:22 AM  Result Value Ref Range   WBC 16.7 (H) 4.0 - 10.5 K/uL   RBC 3.11 (L) 4.22 - 5.81 MIL/uL   Hemoglobin 9.9 (L) 13.0 - 17.0 g/dL   HCT 28.6 (L) 39.0 - 52.0 %   MCV  92.0 80.0 - 100.0 fL   MCH 31.8 26.0 - 34.0 pg   MCHC 34.6 30.0 - 36.0 g/dL   RDW 14.3 11.5 - 15.5 %   Platelets 153 150 - 400 K/uL   nRBC 0.0 0.0 - 0.2 %   Neutrophils Relative % 86 %   Neutro Abs 14.5 (H) 1.7 - 7.7 K/uL   Lymphocytes Relative 6 %   Lymphs Abs 1.0 0.7 - 4.0 K/uL   Monocytes Relative 7 %   Monocytes Absolute 1.2 (H) 0.1 - 1.0 K/uL   Eosinophils Relative 0 %   Eosinophils Absolute 0.0 0.0 - 0.5 K/uL   Basophils Relative 0 %    Basophils Absolute 0.0 0.0 - 0.1 K/uL   Immature Granulocytes 1 %   Abs Immature Granulocytes 0.10 (H) 0.00 - 0.07 K/uL  Comprehensive metabolic panel     Status: Abnormal   Collection Time: 07/05/19  4:22 AM  Result Value Ref Range   Sodium 145 135 - 145 mmol/L   Potassium 3.8 3.5 - 5.1 mmol/L   Chloride 112 (H) 98 - 111 mmol/L   CO2 23 22 - 32 mmol/L   Glucose, Bld 133 (H) 70 - 99 mg/dL   BUN 14 6 - 20 mg/dL   Creatinine, Ser 0.73 0.61 - 1.24 mg/dL   Calcium 8.5 (L) 8.9 - 10.3 mg/dL   Total Protein 5.5 (L) 6.5 - 8.1 g/dL   Albumin 3.0 (L) 3.5 - 5.0 g/dL   AST 33 15 - 41 U/L   ALT 18 0 - 44 U/L   Alkaline Phosphatase 28 (L) 38 - 126 U/L   Total Bilirubin 0.6 0.3 - 1.2 mg/dL   GFR calc non Af Amer >60 >60 mL/min   GFR calc Af Amer >60 >60 mL/min   Anion gap 10 5 - 15  Sodium     Status: None   Collection Time: 07/05/19  8:11 AM  Result Value Ref Range   Sodium 145 135 - 145 mmol/L  Glucose, capillary     Status: Abnormal   Collection Time: 07/05/19  8:16 AM  Result Value Ref Range   Glucose-Capillary 129 (H) 70 - 99 mg/dL  Lactic acid, plasma     Status: None   Collection Time: 07/05/19 11:48 AM  Result Value Ref Range   Lactic Acid, Venous 1.5 0.5 - 1.9 mmol/L  Glucose, capillary     Status: Abnormal   Collection Time: 07/05/19 12:02 PM  Result Value Ref Range   Glucose-Capillary 101 (H) 70 - 99 mg/dL   CT HEAD WO CONTRAST  Result Date: 07/03/2019 CLINICAL DATA:  Postop hematoma evacuation. EXAM: CT HEAD WITHOUT CONTRAST TECHNIQUE: Contiguous axial images were obtained from the base of the skull through the vertex without intravenous contrast. COMPARISON:  07/03/2019 at 5:37 p.m. FINDINGS: Brain: Sequelae of right parieto-occipital craniotomy for tumor resection are again identified. A hematoma in the medial right parietal operative bed is smaller following interval evacuation, now measuring 4.2 x 3.0 x 2.8 cm (AP x transverse x craniocaudal). Gas is noted along the  operative tract in the medial right parieto-occipital region, and there is a new drain in place in this location. There is persistent extensive vasogenic edema in the posterior right cerebral hemisphere. Mass effect from the hematoma has decreased with leftward midline shift now measuring 12 mm (previously 17 mm). The lateral ventricles are slightly smaller than on the prior study. There is a small amount of extra-axial blood along the falx in the  right parietal region. There is also minimal extra-axial blood over the posterior right cerebral convexity underneath the craniotomy. There is persistent partial effacement of the basilar cisterns. There is no cerebellar tonsillar herniation. No sizable acute cortically based infarct is identified. Vascular: No hyperdense vessel. Skull: Right parieto-occipital craniotomy with small volume fluid and gas in the scalp. Sinuses/Orbits: Unremarkable orbits. Minimal mucosal thickening in the paranasal sinuses. Clear mastoid air cells. Other: None. IMPRESSION: Decreased size of right parietal hematoma status post evacuation and drain placement. Persistent extensive vasogenic edema in the posterior right cerebral hemisphere with decreased leftward midline shift, now 12 mm. Electronically Signed   By: Logan Bores M.D.   On: 07/03/2019 20:35   CT HEAD WO CONTRAST  Result Date: 07/03/2019 CLINICAL DATA:  Seizure. Abnormal neurological examination following surgery. Status post tumor resection yesterday and subsequent postoperative hematoma evacuation today. EXAM: CT HEAD WITHOUT CONTRAST TECHNIQUE: Contiguous axial images were obtained from the base of the skull through the vertex without intravenous contrast. COMPARISON:  Head MRI 07/03/2019 FINDINGS: Brain: Postoperative changes are again seen from right parieto-occipital craniotomy. A hyperattenuating hematoma in the medial right parietal operative bed measures 5.3 x 3.6 x 4.3 cm (AP x transverse x craniocaudal), mildly  smaller than on today's MRI. Extensive edema in the posterior right cerebrum is similar to the prior MRI, as is leftward midline shift which measures 17 mm. Postoperative gas is noted along the operative tract. The ventricles are unchanged in size with mild asymmetric dilatation of the right temporal horn again noted. No sizable extra-axial fluid collection is present. There remains partial effacement of the basilar cisterns including the suprasellar cistern. There is no cerebellar tonsillar herniation. Vascular: No hyperdense vessel. Skull: Right parieto-occipital craniotomy with small volume fluid and gas in the scalp soft tissues and with skin staples in place. Sinuses/Orbits: Unremarkable orbits. Minimal mucosal thickening in the paranasal sinuses. Clear mastoid air cells. Other: None. IMPRESSION: Postoperative changes with residual hematoma in the right parietal operative bed, mildly smaller than on today's earlier MRI. Similar degree of edema and mass effect with 17 mm of midline shift. Electronically Signed   By: Logan Bores M.D.   On: 07/03/2019 19:12   DG CHEST PORT 1 VIEW  Result Date: 07/04/2019 CLINICAL DATA:  Acute respiratory failure with hypoxia. EXAM: PORTABLE CHEST 1 VIEW COMPARISON:  Radiograph yesterday. FINDINGS: Left subclavian central line tip in the mid SVC. Interval extubation and removal of enteric tube. Patchy and heterogeneous bilateral lung opacities in a mid lower lung zone predominant distribution, with minimal improved aeration of the right lung base from yesterday otherwise unchanged. Unchanged heart size and mediastinal contours. No pneumothorax. IMPRESSION: 1. Interval extubation and removal of enteric tube. 2. Patchy and heterogeneous bilateral lung opacities with minimal improved aeration of the right lung base from yesterday. Electronically Signed   By: Keith Rake M.D.   On: 07/04/2019 20:05   DG CHEST PORT 1 VIEW  Result Date: 07/03/2019 CLINICAL DATA:  Intubated,  central line placement EXAM: PORTABLE CHEST 1 VIEW COMPARISON:  None. FINDINGS: Two frontal views of the chest demonstrate endotracheal tube overlying tracheal air column tip at level of thoracic inlet. Enteric catheter passes below diaphragm tip projects over gastric body. Left subclavian catheter tip projects over superior vena cava. Cardiac silhouette is unremarkable. There is bilateral airspace disease, with greatest consolidation in the right middle and right lower lobes. No large effusion or pneumothorax. IMPRESSION: 1. Support devices as above. 2. Multifocal bilateral airspace disease, with  dense consolidation at the right base. Electronically Signed   By: Randa Ngo M.D.   On: 07/03/2019 22:44     Assessment/Plan: Diagnosis: right occipital brain tumor s/p craniotomy 1. Does the need for close, 24 hr/day medical supervision in concert with the patient's rehab needs make it unreasonable for this patient to be served in a less intensive setting? Yes 2. Co-Morbidities requiring supervision/potential complications: post-op hematoma, aspiration pneumonia 3. Due to bladder management, bowel management, safety, skin/wound care, disease management, medication administration, pain management and patient education, does the patient require 24 hr/day rehab nursing? Yes 4. Does the patient require coordinated care of a physician, rehab nurse, therapy disciplines of PT, OT, SLP to address physical and functional deficits in the context of the above medical diagnosis(es)? Yes Addressing deficits in the following areas: balance, endurance, locomotion, strength, transferring, bowel/bladder control, bathing, dressing, feeding, grooming, toileting, cognition, speech, swallowing and psychosocial support 5. Can the patient actively participate in an intensive therapy program of at least 3 hrs of therapy per day at least 5 days per week? Yes 6. The potential for patient to make measurable gains while on inpatient  rehab is excellent 7. Anticipated functional outcomes upon discharge from inpatient rehab are modified independent and supervision  with PT, modified independent and supervision with OT, modified independent and supervision with SLP. 8. Estimated rehab length of stay to reach the above functional goals is: 11-18 days 9. Anticipated discharge destination: Home 10. Overall Rehab/Functional Prognosis: excellent  RECOMMENDATIONS: This patient's condition is appropriate for continued rehabilitative care in the following setting: CIR Patient has agreed to participate in recommended program. Yes Note that insurance prior authorization may be required for reimbursement for recommended care.  Comment: Rehab Admissions Coordinator to follow up.  Thanks,  Meredith Staggers, MD, Mellody Drown       Lavon Paganini Angiulli, PA-C 07/05/2019

## 2019-07-05 NOTE — Progress Notes (Signed)
  Echocardiogram 2D Echocardiogram has been performed.  Stephen Beard 07/05/2019, 3:38 PM

## 2019-07-05 NOTE — Evaluation (Signed)
Occupational Therapy Evaluation Patient Details Name: Stephen Beard MRN: [id number] DOB: Sep 17, 1995 Today's Date: 07/05/2019    History of Present Illness 24 y.o. admitted on 06/30/2019 with HA progressively worsening for months and blurry vision in L eye. 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 Pt underwent R occipital craniotomy and resection of glioma on 4/20. Pt required return to OR for R craniotomy for hematoma evacuation 2/2 ICH. Pt extubated on 4/22.   Clinical Impression   PTA, pt was living with his boyfriend and was independent and worked at [employer]. Pt currently requiring Min A for UB ADLs, Mod-Max A for LB ADLs, and Mod A for functional transfers. Pt presenting with decreased balance, poor cognition, blurry vision (with possible diplopia), and limited activity tolerance requiring 15L via NRB for maintaining SpO2 >90%. Pt very motivated to participate in therapy stating "I can't wait to do this." Pt will require further acute OT to facilitate safe dc. Due to pt's age, PLOF, and deficits, recommend dc to CIR for intensive OT to optimize independence, safety, and return to PLOF.     Follow Up Recommendations  CIR    Equipment Recommendations  Other (comment)(Defefr to next venue)    Recommendations for Other Services Rehab consult;PT consult;Speech consult     Precautions / Restrictions Precautions Precautions: Fall;Other (comment) Precaution Comments: EVD Restrictions Weight Bearing Restrictions: No      Mobility Bed Mobility Overal bed mobility: Needs Assistance Bed Mobility: Supine to Sit     Supine to sit: Min assist     General bed mobility comments: Min A for trunk support  Transfers Overall transfer level: Needs assistance Equipment used: 2 person hand held assist Transfers: Sit to/from Stand Sit to Stand: Mod assist;+2 physical assistance         General transfer comment: Mod A +2 for correcting right posterior lean.     Balance  Overall balance assessment: Needs assistance Sitting-balance support: Single extremity supported;Feet unsupported Sitting balance-Leahy Scale: Poor Sitting balance - Comments: modA for LOB donning socks Postural control: Right lateral lean;Posterior lean Standing balance support: Bilateral upper extremity supported Standing balance-Leahy Scale: Poor Standing balance comment: minA x2 to maintain standing balance                           ADL either performed or assessed with clinical judgement   ADL Overall ADL's : Needs assistance/impaired Eating/Feeding: Set up;Supervision/ safety;Sitting Eating/Feeding Details (indicate cue type and reason): Giving pt small sips of water. Pt requiring cues not to take large, repeated sips Grooming: Minimal assistance;Sitting   Upper Body Bathing: Minimal assistance;Sitting   Lower Body Bathing: Moderate assistance;+2 for physical assistance;+2 for safety/equipment;Sit to/from stand   Upper Body Dressing : Minimal assistance;Sitting   Lower Body Dressing: Maximal assistance;+2 for physical assistance Lower Body Dressing Details (indicate cue type and reason): Pt donning socks while sitting at EOB using figure four method. When donning left sock, pt requiring Min-Mod A for sitting balance. When leftting right, pt with LOB to right and requiring Max A for correction Toilet Transfer: Moderate assistance;+2 for physical assistance;+2 for safety/equipment;Ambulation(short distance)           Functional mobility during ADLs: Moderate assistance;+2 for physical assistance;+2 for safety/equipment General ADL Comments: Pt presenting with decreased cognition, balance, strength, and activity tolerance. Very motivated     Vision Baseline Vision/History: No visual deficits Patient Visual Report: Blurring of vision Vision Assessment?: Yes Eye Alignment: Within Functional  Limits Ocular Range of Motion: Within Functional Limits Tracking/Visual  Pursuits: Decreased smoothness of horizontal tracking;Decreased smoothness of vertical tracking;Requires cues, head turns, or add eye shifts to track;Unable to hold eye position out of midline;Impaired - to be further tested in functional context Convergence: Impaired - to be further tested in functional context Depth Perception: Overshoots Additional Comments: Pt with difficulty describing vision deficits due to cognition. Pt closing his eye often and at times closing one eye. Able to track but presenting with decreased smooth tracking adn requiring cues throughout. Overshooting.      Perception     Praxis      Pertinent Vitals/Pain Pain Assessment: Faces Faces Pain Scale: Hurts even more Pain Location: head Pain Descriptors / Indicators: Grimacing Pain Intervention(s): Limited activity within patient's tolerance;Monitored during session;Repositioned     Hand Dominance Right   Extremity/Trunk Assessment Upper Extremity Assessment Upper Extremity Assessment: Overall WFL for tasks assessed   Lower Extremity Assessment Lower Extremity Assessment: Defer to PT evaluation   Cervical / Trunk Assessment Cervical / Trunk Assessment: Normal   Communication Communication Communication: No difficulties   Cognition Arousal/Alertness: Awake/alert Behavior During Therapy: Impulsive Overall Cognitive Status: Impaired/Different from baseline Area of Impairment: Attention;Memory;Following commands;Safety/judgement;Awareness;Problem solving                   Current Attention Level: Sustained(easily distractible) Memory: Decreased recall of precautions;Decreased short-term memory Following Commands: Follows one step commands consistently(requires verbal cues) Safety/Judgement: Decreased awareness of safety;Decreased awareness of deficits Awareness: Intellectual Problem Solving: Slow processing;Requires verbal cues General Comments: Very motivated to participate in therapy. Decreased  awarenss of deficits and safety. Repeating certain questions. Enjoys joking and laughing throughout session. Requiring increased cues and time for following commands. Poor ST memory and asking same questions.    General Comments  pt on nonrebreather during session, desats into high 80s with breaks from nonrebreather and sats do drop into low 90s with activity. Pt asking multiple times for short breaks to catch his breath. Mother and father arriving at end of session    Exercises     Shoulder Instructions      Home Living Family/patient expects to be discharged to:: Private residence Living Arrangements: Spouse/significant other(boyfriend) Available Help at Discharge: Family;Available 24 hours/day(mom/boyfriend) Type of Home: Apartment Home Access: Level entry     Home Layout: One level     Bathroom Shower/Tub: Teacher, early years/pre: Standard Bathroom Accessibility: No   Home Equipment: None          Prior Functioning/Environment Level of Independence: Independent        Comments: working as Engineer, structural Problem List: Decreased strength;Decreased range of motion;Decreased activity tolerance;Impaired balance (sitting and/or standing);Decreased knowledge of use of DME or AE;Decreased knowledge of precautions;Pain;Decreased cognition;Decreased safety awareness      OT Treatment/Interventions: Self-care/ADL training;Therapeutic exercise;Energy conservation;DME and/or AE instruction;Therapeutic activities;Patient/family education    OT Goals(Current goals can be found in the care plan section) Acute Rehab OT Goals Patient Stated Goal: To return to independence OT Goal Formulation: With patient Time For Goal Achievement: 07/19/19 Potential to Achieve Goals: Good  OT Frequency: Min 3X/week   Barriers to D/C:            Co-evaluation PT/OT/SLP Co-Evaluation/Treatment: Yes Reason for Co-Treatment: Complexity of the patient's impairments  (multi-system involvement);For patient/therapist safety;To address functional/ADL transfers PT goals addressed during session: Mobility/safety with mobility;Balance;Strengthening/ROM OT goals addressed during session: ADL's and self-care  AM-PAC OT "6 Clicks" Daily Activity     Outcome Measure Help from another person eating meals?: A Little Help from another person taking care of personal grooming?: A Little Help from another person toileting, which includes using toliet, bedpan, or urinal?: A Lot Help from another person bathing (including washing, rinsing, drying)?: A Lot Help from another person to put on and taking off regular upper body clothing?: A Little Help from another person to put on and taking off regular lower body clothing?: A Lot 6 Click Score: 15   End of Session Equipment Utilized During Treatment: Oxygen(15L via NRB) Nurse Communication: Mobility status  Activity Tolerance: Patient tolerated treatment well Patient left: in chair;with call bell/phone within reach;with chair alarm set;with family/visitor present  OT Visit Diagnosis: Unsteadiness on feet (R26.81);Other abnormalities of gait and mobility (R26.89);Muscle weakness (generalized) (M62.81);Pain Pain - part of body: (HA)                Time: [id number] OT Time Calculation (min): 28 min Charges:  OT General Charges $OT Visit: 1 Visit OT Evaluation $OT Eval Moderate Complexity: [place], OTR/L Acute Rehab Pager: [phone] Office: [place] 07/05/2019, 1:25 PM

## 2019-07-05 NOTE — Progress Notes (Signed)
Inpatient Rehab Admissions Coordinator Note:   Per PT recommendations, pt was screened for CIR candidacy by Shann Medal, PT, DPT.  At this time we are recommending an inpatient rehab consult.  I will place an order per our protocol.  Please contact me with questions.   Shann Medal, PT, DPT [phone] 07/05/19 12:45 PM

## 2019-07-05 NOTE — Evaluation (Signed)
Physical Therapy Evaluation Patient Details Name: Stephen Beard MRN: [id number] DOB: Jul 01, 1995 Today's Date: 07/05/2019   History of Present Illness  24 y.o. admitted on 06/30/2019 with HA progressively worsening for months and blurry vision in L eye. 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 Pt underwent R occipital craniotomy and resection of glioma on 4/20. Pt required return to OR for R craniotomy for hematoma evacuation 2/2 ICH. Pt extubated on 4/22.  Clinical Impression  Pt presents to PT with deficits in functional mobility, balance, gait, safety awareness, awareness of deficits, vision. Pt presents with mild L inattention and more prominent lean to R side during activity, needing physical assistance eot maintain sitting and standing balance. Pt easily distracted during session, requiring frequent verbal cues to maintain attention to task. Pt limited to transfer and short distance of ambulation due to instability and falls risk, as well as reduced activity tolerance due to pulmonary status. Pt will benefit from aggressive mobilization to improve activity tolerance, balance, and gait, in hopes of restoring independent mobility.    Follow Up Recommendations CIR;Supervision/Assistance - 24 hour    Equipment Recommendations  (TBD with progression, RW if home today)    Recommendations for Other Services Rehab consult     Precautions / Restrictions Precautions Precautions: Fall;Other (comment) Precaution Comments: EVD Restrictions Weight Bearing Restrictions: No      Mobility  Bed Mobility Overal bed mobility: Needs Assistance Bed Mobility: Supine to Sit     Supine to sit: Min guard        Transfers Overall transfer level: Needs assistance Equipment used: 2 person hand held assist Transfers: Sit to/from Stand Sit to Stand: Mod assist;+2 physical assistance            Ambulation/Gait Ambulation/Gait assistance: Min assist;+2 physical assistance Gait  Distance (Feet): 3 Feet Assistive device: 2 person hand held assist Gait Pattern/deviations: Step-to pattern;Drifts right/left Gait velocity: reduced Gait velocity interpretation: <1.31 ft/sec, indicative of household ambulator General Gait Details: pt with increased sway requiring BUE support to stabilize during mobility  Stairs            Wheelchair Mobility    Modified Rankin (Stroke Patients Only) Modified Rankin (Stroke Patients Only) Pre-Morbid Rankin Score: No symptoms Modified Rankin: Moderately severe disability     Balance Overall balance assessment: Needs assistance Sitting-balance support: Single extremity supported;Feet unsupported Sitting balance-Leahy Scale: Poor Sitting balance - Comments: modA for LOB donning socks Postural control: Right lateral lean;Posterior lean Standing balance support: Bilateral upper extremity supported Standing balance-Leahy Scale: Poor Standing balance comment: minA x2 to maintain standing balance                             Pertinent Vitals/Pain Pain Assessment: Faces Faces Pain Scale: Hurts even more Pain Location: head Pain Descriptors / Indicators: Grimacing Pain Intervention(s): Monitored during session    Home Living Family/patient expects to be discharged to:: Private residence Living Arrangements: Spouse/significant other(boyfriend) Available Help at Discharge: Family;Available 24 hours/day(mom/boyfriend) Type of Home: Apartment Home Access: Level entry     Home Layout: One level Home Equipment: None      Prior Function Level of Independence: Independent         Comments: working as Engineer, agricultural        Extremity/Trunk Assessment   Upper Extremity Assessment Upper Extremity Assessment: Defer to OT evaluation    Lower Extremity Assessment Lower Extremity Assessment:  Overall WFL for tasks assessed    Cervical / Trunk Assessment Cervical / Trunk Assessment:  Normal  Communication   Communication: No difficulties  Cognition Arousal/Alertness: Awake/alert Behavior During Therapy: Impulsive Overall Cognitive Status: Impaired/Different from baseline Area of Impairment: Attention;Memory;Following commands;Safety/judgement;Awareness;Problem solving                   Current Attention Level: Sustained(easily distractible) Memory: Decreased recall of precautions;Decreased short-term memory Following Commands: Follows one step commands consistently(requires verbal cues) Safety/Judgement: Decreased awareness of safety;Decreased awareness of deficits Awareness: Intellectual Problem Solving: Slow processing;Requires verbal cues        General Comments General comments (skin integrity, edema, etc.): pt on nonrebreather during session, desats into high 80s with breaks from nonrebreather and sats do drop into low 90s with activity. Pt asking multiple times for short breaks to catch his breath    Exercises     Assessment/Plan    PT Assessment Patient needs continued PT services  PT Problem List Decreased activity tolerance;Decreased balance;Decreased mobility;Decreased cognition;Decreased knowledge of use of DME;Decreased safety awareness;Decreased knowledge of precautions;Cardiopulmonary status limiting activity       PT Treatment Interventions DME instruction;Gait training;Stair training;Functional mobility training;Therapeutic activities;Therapeutic exercise;Balance training;Cognitive remediation;Neuromuscular re-education;Patient/family education    PT Goals (Current goals can be found in the Care Plan section)  Acute Rehab PT Goals Patient Stated Goal: To return to independence PT Goal Formulation: With patient/family Time For Goal Achievement: 07/19/19 Potential to Achieve Goals: Good Additional Goals Additional Goal #1: Pt will maintain dynamic standing balance within 10 inches of his base of support without UE support,  independently.    Frequency Min 4X/week   Barriers to discharge        Co-evaluation PT/OT/SLP Co-Evaluation/Treatment: Yes Reason for Co-Treatment: Complexity of the patient's impairments (multi-system involvement);Necessary to address cognition/behavior during functional activity;For patient/therapist safety;To address functional/ADL transfers PT goals addressed during session: Mobility/safety with mobility;Balance;Strengthening/ROM         AM-PAC PT "6 Clicks" Mobility  Outcome Measure Help needed turning from your back to your side while in a flat bed without using bedrails?: A Little Help needed moving from lying on your back to sitting on the side of a flat bed without using bedrails?: A Little Help needed moving to and from a bed to a chair (including a wheelchair)?: A Lot Help needed standing up from a chair using your arms (e.g., wheelchair or bedside chair)?: A Lot Help needed to walk in hospital room?: A Little Help needed climbing 3-5 steps with a railing? : A Lot 6 Click Score: 15    End of Session Equipment Utilized During Treatment: Oxygen Activity Tolerance: Patient tolerated treatment well Patient left: in chair;with call bell/phone within reach;with chair alarm set Nurse Communication: Mobility status PT Visit Diagnosis: Unsteadiness on feet (R26.81);Other abnormalities of gait and mobility (R26.89);Other symptoms and signs involving the nervous system (R29.898)    Time: [id number] PT Time Calculation (min) (ACUTE ONLY): 24 min   Charges:   PT Evaluation $PT Eval Moderate Complexity: 1 Mod          Zenaida Niece, PT, DPT Acute Rehabilitation Pager: [phone]   Zenaida Niece 07/05/2019, 11:09 AM

## 2019-07-05 NOTE — Evaluation (Signed)
Clinical/Bedside Swallow Evaluation Patient Details  Name: Stephen Beard MRN: [id number] Date of Birth: 1996/01/26  Today's Date: 07/05/2019 Time: SLP Start Time (ACUTE ONLY): 1500 SLP Stop Time (ACUTE ONLY): 1511 SLP Time Calculation (min) (ACUTE ONLY): 11 min  Past Medical History:  Past Medical History:  Diagnosis Date  . ADHD    Past Surgical History:  Past Surgical History:  Procedure Laterality Date  . APPLICATION OF CRANIAL NAVIGATION N/A 07/02/2019   Procedure: APPLICATION OF CRANIAL NAVIGATION;  Surgeon: Judith Part, MD;  Location: [place];  Service: Neurosurgery;  Laterality: N/A;  . CRANIOTOMY Right 07/02/2019   Procedure: CRANIOTOMY TUMOR EXCISION with Lucky Rathke;  Surgeon: Judith Part, MD;  Location: [place];  Service: Neurosurgery;  Laterality: Right;  posterior  . CRANIOTOMY N/A 07/03/2019   Procedure: CRANIOTOMY HEMATOMA EVACUATION SUBDURAL;  Surgeon: Judith Part, MD;  Location: [place];  Service: Neurosurgery;  Laterality: N/A;  . CRANIOTOMY Right 07/03/2019   Procedure: CRANIOTOMY HEMATOMA EVACUATION SUBDURAL;  Surgeon: Judith Part, MD;  Location: [place];  Service: Neurosurgery;  Laterality: Right;   HPI:  24 y.o. admitted on 06/30/2019 with HA progressively worsening for months and blurry vision in L eye. CT and imaging revealed two large masses in the right parieto-occipital lobe with surrounding vasogenic edema and a 21 mm of right to left shift. Pt underwent R occipital craniotomy and resection of glioma on 4/20. Pt required return to OR for R craniotomy for hematoma evacuation 2/2 ICH. Pt extubated on 4/22.   Assessment / Plan / Recommendation Clinical Impression  Pt participated in clinical swallow assessment. He had baseline cough upon entering room, attempting to expectorate and self-suction.  No focal CN deficits observed; oral mechanism exam was normal. Pt consumed thin liquids and pudding with adequate oral attention/manipulation, brisk  swallow response, no s/s of aspiration.  Desaturated to 80s when off ventimask, some tachypnea during coughing spells.  Coughing did NOT correlate with PO consumption. Currently on clear liquid diet; can advance to fulls.  Give meds whole in water.  D/W pt, his mother, and Therapist, sports. SLP will follow x1 to ensure safety/advance diet.  SLP Visit Diagnosis: Dysphagia, unspecified (R13.10)    Aspiration Risk       Diet Recommendation   advance to full liquids  Medication Administration: Whole meds with liquid    Other  Recommendations Oral Care Recommendations: Oral care BID   Follow up Recommendations Inpatient Rehab      Frequency and Duration min 2x/week  1 week       Prognosis Prognosis for Safe Diet Advancement: Good      Swallow Study   General Date of Onset: 06/30/19 HPI: 24 y.o. admitted on 06/30/2019 with HA progressively worsening for months and blurry vision in L eye. CT and imaging revealed two large masses in the right parieto-occipital lobe with surrounding vasogenic edema and a 21 mm of right to left shift. Pt underwent R occipital craniotomy and resection of glioma on 4/20. Pt required return to OR for R craniotomy for hematoma evacuation 2/2 ICH. Pt extubated on 4/22. Type of Study: Bedside Swallow Evaluation Diet Prior to this Study: Thin liquids Temperature Spikes Noted: No Respiratory Status: Venti-mask History of Recent Intubation: Yes Length of Intubations (days): (for surgery) Date extubated: 07/04/19 Behavior/Cognition: Alert;Pleasant mood Oral Cavity Assessment: Within Functional Limits Oral Care Completed by SLP: Recent completion by staff Oral Cavity - Dentition: Adequate natural dentition Vision: Functional for self-feeding Self-Feeding Abilities: Able to feed self  Patient Positioning: Upright in bed Baseline Vocal Quality: Normal Volitional Cough: Strong Volitional Swallow: Able to elicit    Oral/Motor/Sensory Function Overall Oral Motor/Sensory Function:  Within functional limits   Ice Chips Ice chips: Within functional limits   Thin Liquid Thin Liquid: Within functional limits    Nectar Thick Nectar Thick Liquid: Not tested   Honey Thick Honey Thick Liquid: Not tested   Puree Puree: Within functional limits   Solid     Solid: Not tested      Stephen Beard Stephen Beard 07/05/2019,3:34 PM   Stephen Bamberg L. Tivis Ringer, [place] Office number [phone] Pager [phone]

## 2019-07-06 LAB — STREP PNEUMONIAE URINARY ANTIGEN: Strep Pneumo Urinary Antigen: NEGATIVE

## 2019-07-06 LAB — POCT I-STAT 7, (LYTES, BLD GAS, ICA,H+H)
Acid-base deficit: 9 mmol/L — ABNORMAL HIGH (ref 0.0–2.0)
Acid-base deficit: 4 mmol/L — ABNORMAL HIGH (ref 0.0–2.0)
Bicarbonate: 19.3 mmol/L — ABNORMAL LOW (ref 20.0–28.0)
Bicarbonate: 22.5 mmol/L (ref 20.0–28.0)
Calcium, Ion: 1.17 mmol/L (ref 1.15–1.40)
Calcium, Ion: 1.14 mmol/L — ABNORMAL LOW (ref 1.15–1.40)
HCT: 32 % — ABNORMAL LOW (ref 39.0–52.0)
HCT: 26 % — ABNORMAL LOW (ref 39.0–52.0)
Hemoglobin: 10.9 g/dL — ABNORMAL LOW (ref 13.0–17.0)
Hemoglobin: 8.8 g/dL — ABNORMAL LOW (ref 13.0–17.0)
O2 Saturation: 90 %
O2 Saturation: 97 %
Potassium: 3.4 mmol/L — ABNORMAL LOW (ref 3.5–5.1)
Potassium: 3.6 mmol/L (ref 3.5–5.1)
Sodium: 133 mmol/L — ABNORMAL LOW (ref 135–145)
Sodium: 136 mmol/L (ref 135–145)
TCO2: 21 mmol/L — ABNORMAL LOW (ref 22–32)
TCO2: 24 mmol/L (ref 22–32)
pCO2 arterial: 54.4 mmHg — ABNORMAL HIGH (ref 32.0–48.0)
pCO2 arterial: 45.5 mmHg (ref 32.0–48.0)
pH, Arterial: 7.158 — CL (ref 7.350–7.450)
pH, Arterial: 7.302 — ABNORMAL LOW (ref 7.350–7.450)
pO2, Arterial: 76 mmHg — ABNORMAL LOW (ref 83.0–108.0)
pO2, Arterial: 104 mmHg (ref 83.0–108.0)

## 2019-07-06 LAB — CBC WITH DIFFERENTIAL/PLATELET
Abs Immature Granulocytes: 0.18 10*3/uL — ABNORMAL HIGH (ref 0.00–0.07)
Basophils Absolute: 0 10*3/uL (ref 0.0–0.1)
Basophils Relative: 0 %
Eosinophils Absolute: 0 10*3/uL (ref 0.0–0.5)
Eosinophils Relative: 0 %
HCT: 28.9 % — ABNORMAL LOW (ref 39.0–52.0)
Hemoglobin: 9.7 g/dL — ABNORMAL LOW (ref 13.0–17.0)
Immature Granulocytes: 1 %
Lymphocytes Relative: 7 %
Lymphs Abs: 0.9 10*3/uL (ref 0.7–4.0)
MCH: 31.5 pg (ref 26.0–34.0)
MCHC: 33.6 g/dL (ref 30.0–36.0)
MCV: 93.8 fL (ref 80.0–100.0)
Monocytes Absolute: 1.2 10*3/uL — ABNORMAL HIGH (ref 0.1–1.0)
Monocytes Relative: 8 %
Neutro Abs: 12.1 10*3/uL — ABNORMAL HIGH (ref 1.7–7.7)
Neutrophils Relative %: 84 %
Platelets: 167 10*3/uL (ref 150–400)
RBC: 3.08 MIL/uL — ABNORMAL LOW (ref 4.22–5.81)
RDW: 14.1 % (ref 11.5–15.5)
WBC: 14.4 10*3/uL — ABNORMAL HIGH (ref 4.0–10.5)
nRBC: 0 % (ref 0.0–0.2)

## 2019-07-06 LAB — GLUCOSE, CAPILLARY
Glucose-Capillary: 144 mg/dL — ABNORMAL HIGH (ref 70–99)
Glucose-Capillary: 98 mg/dL (ref 70–99)
Glucose-Capillary: 151 mg/dL — ABNORMAL HIGH (ref 70–99)
Glucose-Capillary: 143 mg/dL — ABNORMAL HIGH (ref 70–99)
Glucose-Capillary: 139 mg/dL — ABNORMAL HIGH (ref 70–99)
Glucose-Capillary: 111 mg/dL — ABNORMAL HIGH (ref 70–99)

## 2019-07-06 LAB — SODIUM
Sodium: 144 mmol/L (ref 135–145)
Sodium: 143 mmol/L (ref 135–145)
Sodium: 141 mmol/L (ref 135–145)
Sodium: 141 mmol/L (ref 135–145)

## 2019-07-06 LAB — MAGNESIUM: Magnesium: 2 mg/dL (ref 1.7–2.4)

## 2019-07-06 LAB — PROCALCITONIN: Procalcitonin: 1.72 ng/mL

## 2019-07-06 LAB — MRSA PCR SCREENING: MRSA by PCR: NEGATIVE

## 2019-07-06 LAB — PHOSPHORUS: Phosphorus: 2.3 mg/dL — ABNORMAL LOW (ref 2.5–4.6)

## 2019-07-06 MED ORDER — FUROSEMIDE 10 MG/ML IJ SOLN
20.0000 mg | Freq: Once | INTRAMUSCULAR | Status: AC
  Administered 2019-07-06: 20 mg via INTRAVENOUS
  Filled 2019-07-06: qty 2

## 2019-07-06 MED ORDER — OXYCODONE HCL ER 15 MG PO T12A
15.0000 mg | EXTENDED_RELEASE_TABLET | Freq: Two times a day (BID) | ORAL | Status: DC
  Administered 2019-07-06 – 2019-07-12 (×13): 15 mg via ORAL
  Filled 2019-07-06 (×13): qty 1

## 2019-07-06 MED ORDER — POTASSIUM PHOSPHATES 15 MMOLE/5ML IV SOLN
10.0000 mmol | Freq: Once | INTRAVENOUS | Status: AC
  Administered 2019-07-06: 10 mmol via INTRAVENOUS
  Filled 2019-07-06: qty 3.33

## 2019-07-06 NOTE — Progress Notes (Signed)
Patient ID: Stephen Beard, male   DOB: 1995-11-09, 24 y.o.   MRN: [id number] BP (!) 121/59   Pulse 92   Temp 98 F (36.7 C) (Axillary)   Resp (!) 27   Ht 5\' 7"  (1.702 m)   Wt 59 kg   SpO2 90%   BMI 20.37 kg/m  Alert and oriented x 4, speech is clear and fluent Perrl, full eom Symmetric facies, tongue and uvula midline Moving all extremities well Complaining of pain, will start oxycontin for a smoother pain profile. Consider an anxiolytic Na is in normal range. Efficacy of 3% for tumor induced edema is limited.  Pneumonia being treated with unasyn

## 2019-07-06 NOTE — Progress Notes (Signed)
NAME:  Stephen Beard, MRN:  [id number], DOB:  March 08, 1996, LOS: 6 ADMISSION DATE:  06/30/2019, CONSULTATION DATE: 07/03/2019 REFERRING MD: Dr. Zada Finders, CHIEF COMPLAINT: Status post hematoma evacuation  Brief History    Patient is a 24 year old admitted on the 18th with a new diagnosis of parietal and occipital glioma.  He underwent resection yesterday with postoperative hematoma requiring a trip to the operating room this evening for evacuation.  He seen post operatively in the neuro ICU.  [place] as he is heavily sedated on fentanyl and propofol mechanically ventilated.  He does withdraw to pain.  His pupils are 2 mm and not light responsive.  He does overbreathing the ventilator.  Laboratories are wholly unremarkable.  He is on Decadron.  Currently the patient is relatively hypoxemic on 100% FiO2 with O2 saturation 100% PEEP of 7.  His lungs are coarse in both lung fields.  Chest x-ray postoperatively is pending.  Past Medical History  ADHD on Adderall  Significant Hospital Events   07/02/2019: Section parietal-occipital glioma 07/03/2019: Postoperative hematoma evacuation 4/22 - extubated but hypoxemic needing face mask. CXR with significant infiltrates 4/23 - -remains extubated with good wob but still very hypoxemci needing face mask. Thirsty. On 3% saline. Headache less but still + per NSGY. Had fever to 103F yesterday. WBC up. On D3 unasyn  Consults:  PCCM  Procedures:  As above  Significant Diagnostic Tests:  Serial CT scans  Micro Data:  NA  Antimicrobials:   BActriacisn 4/20 >> 420 Ancelf 4/20 >> 4/20  Unasyn 4/21 >>       Interim history/subjective:    07/06/2019   - afebrile x > 24h. WBC 14.4K. Still on face mask o2. HE feels it is too much.   Objective   Blood pressure 114/64, pulse 69, temperature 98 F (36.7 C), temperature source Axillary, resp. rate (!) 23, height 5\' 7"  (1.702 m), weight 59 kg, SpO2 98 %.        Intake/Output Summary (Last 24  hours) at 07/06/2019 1341 Last data filed at 07/06/2019 1300 Gross per 24 hour  Intake 4457.99 ml  Output 1326 ml  Net 3131.99 ml   Filed Weights   07/02/19 0725  Weight: 59 kg      General Appearance:  Looks better. On face mask o2.  Head:  Normocephalic, without obvious abnormality, atraumatic Eyes:  Eyes closed with a towel due to headache    Ears:  Normal external ear canals, both ears Nose:  G tube - no Throat:  ETT TUBE - no , OG tube - no. FAce mask o2 Neck:  Supple,  No enlargement/tenderness/nodules Lungs: Clear to auscultation bilaterally but at bases with crackles Heart:  S1 and S2 normal, no murmur, CVP - no.  Pressors - no Abdomen:  Soft, no masses, no organomegaly Genitalia / Rectal:  Not done Extremities:  Extremities- intact Skin:  ntact in exposed areas . Neurologic:  Sedation - none -> RASS - +1 . Moves all 4s - yes. CAM-ICU - neg . Orientation - x3+  No change since yesterday    Resolved Hospital Problem list   Thrombocytopenia on 4/23  Assessment & Plan:  Status post resection of a parietal and occipital glioma, status post hematoma evacuation today:   07/06/2019 - headache +  Plan  - per neurosurgery - all below - decadron per NSGY  - 3% saline per NSGY  - get echo rule out volume overload  - nicotine patch - melatonin for sleep  -  opioids for had  Acute hypoxemic resp failure due to infiltrates - intubated 4/21 - extubated 4/22   07/06/2019 - > remains extubated but needing face mask. Echo normal. Likely due to pneumonia and atelectsis  Plan  - face mask - pulm toilet  - lasix x 1   Concern for sepsis due to pneumonia NOS  - 07/06/2019 -  FEver better today, > PCT better  Plan - continue unasyn  - await urine strep and urine leg    Anemia of ICU  - 07/06/2019 - no bleed and btter  Plan - - PRBC for hgb </= 6.9gm%    - exceptions are   -  if ACS susepcted/confirmed then transfuse for hgb </= 8.0gm%,  or    -  active  bleeding with hemodynamic instability, then transfuse regardless of hemoglobin value   At at all times try to transfuse 1 unit prbc as possible with exception of active hemorrhage    Best practice:  Diet: N.p.o. for now Pain/Anxiety/Delirium protocol (if indicated): none VAP protocol (if indicated): Yes DVT prophylaxis: SCDs -> change to  lovenox per NSGY timing GI prophylaxis: Protonix with steroids Glucose control: Yes Mobility: Bedrest Code Status: Full Family Communication: patient at bedside Disposition: ICU      SIGNATURE    Dr. Brand Males, M.D., F.C.C.P,  Pulmonary and Critical Care Medicine Staff Physician, [place] Director - Interstitial Lung Disease  Program  Pulmonary [place] at [place], [place], [address]  Pager: [phone], If no answer or between  15:00h - 7:00h: call [phone] Telephone: [phone]  1:47 PM 07/06/2019       [place]  Lab 07/02/19 1513 07/02/19 1634 07/03/19 1638 07/03/19 1849 07/03/19 2117  PHART 7.409 7.377 7.158* 7.302* 7.274*  PCO2ART 33.3 37.3 54.4* 45.5 45.4  PO2ART 232.0* 257.0* 76* 104 69*  HCO3 21.3 22.1 19.3* 22.5 21.1  TCO2 22 23 21* 24 22  O2SAT 100.0 100.0 90.0 97.0 91.0    CBC Recent Labs  Lab 07/04/19 0806 07/05/19 0422 07/06/19 0423  HGB 9.0* 9.9* 9.7*  HCT 27.0* 28.6* 28.9*  WBC 11.6* 16.7* 14.4*  PLT 120* 153 167    COAGULATION Recent Labs  Lab 07/03/19 0900  INR 1.1    CARDIAC  No results for input(s): TROPONINI in the last 168 hours. No results for input(s): PROBNP in the last 168 hours.   CHEMISTRY Recent Labs  Lab 06/30/19 2028 07/02/19 1229 07/03/19 0900 07/03/19 1638 07/04/19 0458 07/04/19 0806 07/05/19 0422 07/05/19 0422 07/05/19 0811 07/05/19 1429 07/05/19 2107 07/06/19 0229 07/06/19 0423 07/06/19 0831  NA 138   < > 136   < > 140   < > 145   < > 145 144 138 144   --  143  K 4.0   < > 3.5   < > 3.7  --  3.8  --   --   --   --   --   --   --   CL 104  --  101  --  111  --  112*  --   --   --   --   --   --   --   CO2 22  --  24  --  22  --  23  --   --   --   --   --   --   --  GLUCOSE 102*  --  147*  --  120*  --  133*  --   --   --   --   --   --   --   BUN 11  --  7  --  6  --  14  --   --   --   --   --   --   --   CREATININE 1.07  --  0.61  --  0.66  --  0.73  --   --   --   --   --   --   --   CALCIUM 9.3  --  8.5*  --  7.7*  --  8.5*  --   --   --   --   --   --   --   MG  --   --   --   --   --   --  2.1  --   --   --   --   --  2.0  --   PHOS  --   --  3.1  --  1.4*  --  2.5  --   --   --   --   --  2.3*  --    < > = values in this interval not displayed.   Estimated Creatinine Clearance: 118.8 mL/min (by C-G formula based on SCr of 0.73 mg/dL).   LIVER Recent Labs  Lab 07/03/19 0900 07/04/19 0458 07/05/19 0422  AST  --   --  33  ALT  --   --  18  ALKPHOS  --   --  28*  BILITOT  --   --  0.6  PROT  --   --  5.5*  ALBUMIN 4.0 2.9* 3.0*  INR 1.1  --   --      INFECTIOUS Recent Labs  Lab 07/05/19 1148 07/05/19 1200 07/06/19 0423  LATICACIDVEN 1.5  --   --   PROCALCITON  --  2.67 1.72     ENDOCRINE CBG (last 3)  Recent Labs    07/06/19 0351 07/06/19 0744 07/06/19 1150  GLUCAP 144* 98 151*         IMAGING x48h  - image(s) personally visualized  -   highlighted in bold DG CHEST PORT 1 VIEW  Result Date: 07/04/2019 CLINICAL DATA:  Acute respiratory failure with hypoxia. EXAM: PORTABLE CHEST 1 VIEW COMPARISON:  Radiograph yesterday. FINDINGS: Left subclavian central line tip in the mid SVC. Interval extubation and removal of enteric tube. Patchy and heterogeneous bilateral lung opacities in a mid lower lung zone predominant distribution, with minimal improved aeration of the right lung base from yesterday otherwise unchanged. Unchanged heart size and mediastinal contours. No pneumothorax. IMPRESSION: 1. Interval  extubation and removal of enteric tube. 2. Patchy and heterogeneous bilateral lung opacities with minimal improved aeration of the right lung base from yesterday. Electronically Signed   By: Keith Rake M.D.   On: 07/04/2019 20:05   ECHOCARDIOGRAM COMPLETE  Result Date: 07/05/2019    ECHOCARDIOGRAM REPORT   Patient Name:   NIGUEL HARGAN Date of Exam: 07/05/2019 Medical Rec #:  [id number]          Height:       67.0 in Accession #:    [id number]         Weight:       130.1 lb Date of Birth:  [birth year]  BSA:          1.684 m Patient Age:    23 years           BP:           119/64 mmHg Patient Gender: M                  HR:           78 bpm. Exam Location:  Inpatient Procedure: 2D Echo, Cardiac Doppler and Color Doppler Indications:    Shortness of breath  History:        Patient has no prior history of Echocardiogram examinations.                 Signs/Symptoms:Shortness of Breath. Brain tumor, s/p craniotomy.  Sonographer:    Dustin Flock Referring Phys: [place]  1. Left ventricular ejection fraction, by estimation, is 60 to 65%. The left ventricle has normal function. The left ventricle has no regional wall motion abnormalities. Left ventricular diastolic parameters were normal.  2. Right ventricular systolic function is normal. The right ventricular size is normal.  3. Left atrial size was mildly dilated.  4. The mitral valve is normal in structure. Trivial mitral valve regurgitation. No evidence of mitral stenosis.  5. The aortic valve is tricuspid. Aortic valve regurgitation is not visualized. No aortic stenosis is present.  6. The inferior vena cava is normal in size with greater than 50% respiratory variability, suggesting right atrial pressure of 3 mmHg. FINDINGS  Left Ventricle: Left ventricular ejection fraction, by estimation, is 60 to 65%. The left ventricle has normal function. The left ventricle has no regional wall motion abnormalities. The left  ventricular internal cavity size was normal in size. There is  no left ventricular hypertrophy. Left ventricular diastolic parameters were normal. Right Ventricle: The right ventricular size is normal. No increase in right ventricular wall thickness. Right ventricular systolic function is normal. Left Atrium: Left atrial size was mildly dilated. Right Atrium: Right atrial size was normal in size. Pericardium: There is no evidence of pericardial effusion. Mitral Valve: The mitral valve is normal in structure. Normal mobility of the mitral valve leaflets. Trivial mitral valve regurgitation. No evidence of mitral valve stenosis. Tricuspid Valve: The tricuspid valve is normal in structure. Tricuspid valve regurgitation is mild . No evidence of tricuspid stenosis. Aortic Valve: The aortic valve is tricuspid. Aortic valve regurgitation is not visualized. No aortic stenosis is present. Pulmonic Valve: The pulmonic valve was normal in structure. Pulmonic valve regurgitation is not visualized. No evidence of pulmonic stenosis. Aorta: The aortic root is normal in size and structure. Venous: The inferior vena cava is normal in size with greater than 50% respiratory variability, suggesting right atrial pressure of 3 mmHg. IAS/Shunts: No atrial level shunt detected by color flow Doppler. Jenkins Rouge MD Electronically signed by Jenkins Rouge MD Signature Date/Time: 07/05/2019/4:36:37 PM    Final

## 2019-07-07 ENCOUNTER — Inpatient Hospital Stay (HOSPITAL_COMMUNITY): Payer: Medicaid Other

## 2019-07-07 LAB — SODIUM
Sodium: 140 mmol/L (ref 135–145)
Sodium: 140 mmol/L (ref 135–145)
Sodium: 138 mmol/L (ref 135–145)
Sodium: 135 mmol/L (ref 135–145)

## 2019-07-07 LAB — CBC WITH DIFFERENTIAL/PLATELET
Abs Immature Granulocytes: 0.54 10*3/uL — ABNORMAL HIGH (ref 0.00–0.07)
Basophils Absolute: 0 10*3/uL (ref 0.0–0.1)
Basophils Relative: 0 %
Eosinophils Absolute: 0 10*3/uL (ref 0.0–0.5)
Eosinophils Relative: 0 %
HCT: 29.4 % — ABNORMAL LOW (ref 39.0–52.0)
Hemoglobin: 10 g/dL — ABNORMAL LOW (ref 13.0–17.0)
Immature Granulocytes: 4 %
Lymphocytes Relative: 9 %
Lymphs Abs: 1.3 10*3/uL (ref 0.7–4.0)
MCH: 31.8 pg (ref 26.0–34.0)
MCHC: 34 g/dL (ref 30.0–36.0)
MCV: 93.6 fL (ref 80.0–100.0)
Monocytes Absolute: 1.5 10*3/uL — ABNORMAL HIGH (ref 0.1–1.0)
Monocytes Relative: 11 %
Neutro Abs: 10.7 10*3/uL — ABNORMAL HIGH (ref 1.7–7.7)
Neutrophils Relative %: 76 %
Platelets: 190 10*3/uL (ref 150–400)
RBC: 3.14 MIL/uL — ABNORMAL LOW (ref 4.22–5.81)
RDW: 13.3 % (ref 11.5–15.5)
WBC: 14.1 10*3/uL — ABNORMAL HIGH (ref 4.0–10.5)
nRBC: 0 % (ref 0.0–0.2)

## 2019-07-07 LAB — GLUCOSE, CAPILLARY
Glucose-Capillary: 135 mg/dL — ABNORMAL HIGH (ref 70–99)
Glucose-Capillary: 142 mg/dL — ABNORMAL HIGH (ref 70–99)
Glucose-Capillary: 132 mg/dL — ABNORMAL HIGH (ref 70–99)
Glucose-Capillary: 173 mg/dL — ABNORMAL HIGH (ref 70–99)
Glucose-Capillary: 135 mg/dL — ABNORMAL HIGH (ref 70–99)

## 2019-07-07 LAB — PROCALCITONIN: Procalcitonin: 0.76 ng/mL

## 2019-07-07 LAB — PHOSPHORUS: Phosphorus: 2.8 mg/dL (ref 2.5–4.6)

## 2019-07-07 LAB — MAGNESIUM: Magnesium: 1.7 mg/dL (ref 1.7–2.4)

## 2019-07-07 IMAGING — DX DG CHEST 1V PORT
1 series · 1 of 1 positions shown · non-contrast
Comparison: [DATE]

CLINICAL DATA: Acute respiratory failure

EXAM:
PORTABLE CHEST 1 VIEW

[chest]
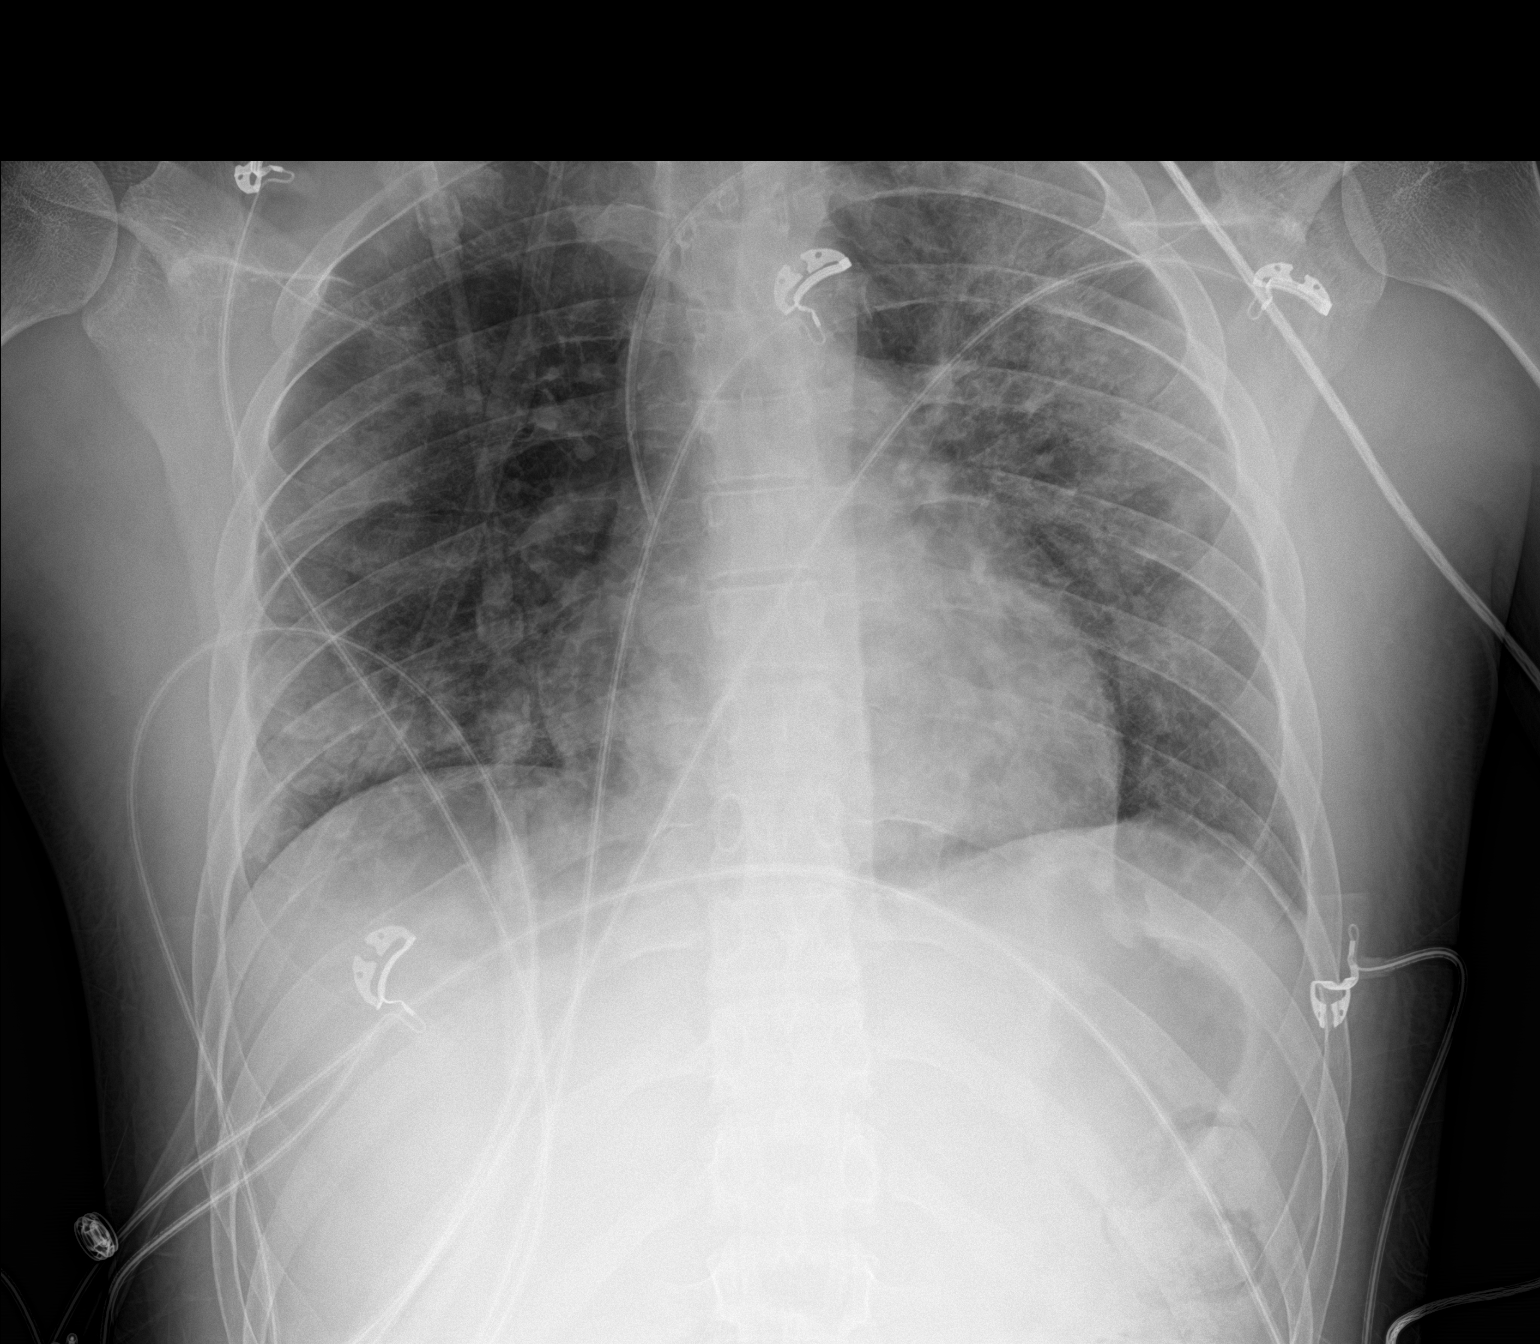

[1 of 1 positions shown; findings below may reference images not displayed]

FINDINGS: The right basilar infiltrate on the previous study has significantly
improved. Peripheral opacities remain in the right lung with
worsening in the upper lung. The opacity in the left lung is now in
a somewhat peripheral location as well. The overall amount of
infiltrate in the left lung is similar although the distribution is
changed. Stable left central line. No pneumothorax.
Cardiomediastinal silhouette is stable.
IMPRESSION: 1. Persistent pulmonary infiltrates. The focal infiltrate in the
right base has significantly improved. There is increasing
peripheral opacity in the right upper lung. The overall amount of
infiltrate on the left is similar although the distribution is
changed. The findings are most consistent with an infectious
process. Asymmetric edema considered less likely.

## 2019-07-07 MED ORDER — MAGNESIUM SULFATE 2 GM/50ML IV SOLN
2.0000 g | Freq: Once | INTRAVENOUS | Status: AC
  Administered 2019-07-07: 2 g via INTRAVENOUS
  Filled 2019-07-07: qty 50

## 2019-07-07 MED ORDER — FUROSEMIDE 10 MG/ML IJ SOLN
20.0000 mg | Freq: Once | INTRAMUSCULAR | Status: AC
  Administered 2019-07-07: 20 mg via INTRAVENOUS
  Filled 2019-07-07: qty 2

## 2019-07-07 MED ORDER — PANTOPRAZOLE SODIUM 40 MG PO TBEC
40.0000 mg | DELAYED_RELEASE_TABLET | Freq: Every day | ORAL | Status: DC
  Administered 2019-07-08 – 2019-07-12 (×5): 40 mg via ORAL
  Filled 2019-07-07 (×5): qty 1

## 2019-07-07 MED ORDER — POTASSIUM CHLORIDE CRYS ER 20 MEQ PO TBCR
40.0000 meq | EXTENDED_RELEASE_TABLET | Freq: Once | ORAL | Status: AC
  Administered 2019-07-07: 40 meq via ORAL
  Filled 2019-07-07: qty 2

## 2019-07-07 NOTE — Progress Notes (Signed)
NAME:  Stephen Beard, MRN:  [id number], DOB:  Nov 15, 1995, LOS: 7 ADMISSION DATE:  06/30/2019, CONSULTATION DATE: 07/03/2019 REFERRING MD: Dr. Zada Finders, CHIEF COMPLAINT: Status post hematoma evacuation  Brief History    Patient is a 24 year old admitted on the 18th with a new diagnosis of parietal and occipital glioma.  He underwent resection yesterday with postoperative hematoma requiring a trip to the operating room this evening for evacuation.  He seen post operatively in the neuro ICU.  [place] as he is heavily sedated on fentanyl and propofol mechanically ventilated.  He does withdraw to pain.  His pupils are 2 mm and not light responsive.  He does overbreathing the ventilator.  Laboratories are wholly unremarkable.  He is on Decadron.  Currently the patient is relatively hypoxemic on 100% FiO2 with O2 saturation 100% PEEP of 7.  His lungs are coarse in both lung fields.  Chest x-ray postoperatively is pending.  Past Medical History  ADHD on Adderall  Significant Hospital Events   07/02/2019: Section parietal-occipital glioma 07/03/2019: Postoperative hematoma evacuation 4/22 - extubated but hypoxemic needing face mask. CXR with significant infiltrates 4/23 - -remains extubated with good wob but still very hypoxemci needing face mask. Thirsty. On 3% saline. Headache less but still + per NSGY. Had fever to 103F yesterday. WBC up. On D3 unasyn 4/24 -  afebrile x > 24h. WBC 14.4K. Still on face mask o2. HE feels it is too much.   Consults:  PCCM  Procedures:  As above  Significant Diagnostic Tests:  Serial CT scans  Micro Data:  NA  Antimicrobials:   BActriacisn 4/20 >> 420 Ancelf 4/20 >> 4/20 xxxxxxxxxxxxx Unasyn 4/21 >>       Interim history/subjective:    07/07/2019   - 91% on [place]  4-6 L [place] whe coughs but better. . Off 3% saline. Did get lasix yesterday. CXR better.   Objective   Blood pressure (!) 152/70, pulse 66, temperature 99.1 F (37.3 C), temperature  source Oral, resp. rate (!) 23, height 5\' 7"  (1.702 m), weight 59 kg, SpO2 91 %.        Intake/Output Summary (Last 24 hours) at 07/07/2019 1422 Last data filed at 07/07/2019 1400 Gross per 24 hour  Intake 4139.56 ml  Output 5205 ml  Net -1065.44 ml   Filed Weights   07/02/19 0725  Weight: 59 kg    General Appearance:  Looks better. HA better Head:  Normocephalic, without obvious abnormality, atraumatic. HAS EVD DRAIN + Eyes:  PERRL - yes, conjunctiva/corneas - clear     Ears:  Normal external ear canals, both ears Nose:  G tube - no but [place] + Throat:  ETT TUBE - no , OG tube - no Neck:  Supple,  No enlargement/tenderness/nodules Lungs: Clear to auscultation bilaterally, Basal crackles Heart:  S1 and S2 normal, no murmur, CVP - no.  Pressors - n Abdomen:  Soft, no masses, no organomegaly Genitalia / Rectal:  Not done Extremities:  Extremities- intact Skin:  ntact in exposed areas . Sacral area - no Neurologic:  Sedation - none -> RASS - +1 . Moves all 4s - yes. CAM-ICU - neg . Orientation - x3+      Resolved Hospital Problem list   Thrombocytopenia on 4/23  Assessment & Plan:  Status post resection of a parietal and occipital glioma, status post hematoma evacuation today:   07/07/2019 - headache better. OFf 3% saline  Plan  - per neurosurgery - all below   Acute  hypoxemic resp failure due to infiltrates - intubated 4/21 - extubated 4/22   07/07/2019 - > remains extubated. Improved from face mask to [place] . CXR better. Abx helping. Lasix x 1 yesterday also helped  Plan  - face mask - pulm toilet  - lasix x 1 again repeat   Concern for sepsis due to pneumonia NOS. [place] strep neg  - 07/07/2019 -  103F on 4/22 and since then better  Plan - continue unasyn x total 7 days  - await urine leg    Anemia of ICU  - 07/07/2019 - no bleed and btter  Plan - - PRBC for hgb </= 6.9gm%    - exceptions are   -  if ACS susepcted/confirmed then transfuse for hgb </=  8.0gm%,  or    -  active bleeding with hemodynamic instability, then transfuse regardless of hemoglobin value   At at all times try to transfuse 1 unit prbc as possible with exception of active hemorrhage  Electrolyte imbalance  07/07/2019 - mag 1.7 and repleteed  Plan  monitor luytes  Best practice:  Diet: N.p.o. for now Pain/Anxiety/Delirium protocol (if indicated): none VAP protocol (if indicated): Yes DVT prophylaxis: SCDs -> change to  lovenox per NSGY timing GI prophylaxis: Protonix with steroids Glucose control: Yes Mobility: Bedrest Code Status: Full Family Communication: patient at bedside Disposition: ICU      SIGNATURE    Dr. Brand Males, M.D., F.C.C.P,  Pulmonary and Critical Care Medicine Staff Physician, [place] Director - Interstitial Lung Disease  Program  Pulmonary [facility] [place] at [place], [place], [address]  Pager: [phone], If no answer or between  15:00h - 7:00h: call [phone] Telephone: [phone]  2:22 PM 07/07/2019       [place]  Lab 07/02/19 1513 07/02/19 1634 07/03/19 1638 07/03/19 1849 07/03/19 2117  PHART 7.409 7.377 7.158* 7.302* 7.274*  PCO2ART 33.3 37.3 54.4* 45.5 45.4  PO2ART 232.0* 257.0* 76* 104 69*  HCO3 21.3 22.1 19.3* 22.5 21.1  TCO2 22 23 21* 24 22  O2SAT 100.0 100.0 90.0 97.0 91.0    CBC Recent Labs  Lab 07/05/19 0422 07/06/19 0423 07/07/19 0530  HGB 9.9* 9.7* 10.0*  HCT 28.6* 28.9* 29.4*  WBC 16.7* 14.4* 14.1*  PLT 153 167 190    COAGULATION Recent Labs  Lab 07/03/19 0900  INR 1.1    CARDIAC  No results for input(s): TROPONINI in the last 168 hours. No results for input(s): PROBNP in the last 168 hours.   CHEMISTRY Recent Labs  Lab 06/30/19 2028 07/02/19 1229 07/03/19 0900 07/03/19 1638 07/04/19 0458 07/04/19 0806 07/05/19 0422 07/05/19 0811 07/06/19 0229 07/06/19 0423  07/06/19 0831 07/06/19 1356 07/06/19 2029 07/07/19 0232 07/07/19 0530 07/07/19 0815  NA 138   < > 136   < > 140   < > 145   < >   < >  --  143 141 141 140  --  140  K 4.0   < > 3.5   < > 3.7  --  3.8  --   --   --   --   --   --   --   --   --   CL 104  --  101  --  111  --  112*  --   --   --   --   --   --   --   --   --  CO2 22  --  24  --  22  --  23  --   --   --   --   --   --   --   --   --   GLUCOSE 102*  --  147*  --  120*  --  133*  --   --   --   --   --   --   --   --   --   BUN 11  --  7  --  6  --  14  --   --   --   --   --   --   --   --   --   CREATININE 1.07  --  0.61  --  0.66  --  0.73  --   --   --   --   --   --   --   --   --   CALCIUM 9.3  --  8.5*  --  7.7*  --  8.5*  --   --   --   --   --   --   --   --   --   MG  --   --   --   --   --   --  2.1  --   --  2.0  --   --   --   --  1.7  --   PHOS  --   --  3.1  --  1.4*  --  2.5  --   --  2.3*  --   --   --   --  2.8  --    < > = values in this interval not displayed.   Estimated Creatinine Clearance: 118.8 mL/min (by C-G formula based on SCr of 0.73 mg/dL).   LIVER Recent Labs  Lab 07/03/19 0900 07/04/19 0458 07/05/19 0422  AST  --   --  33  ALT  --   --  18  ALKPHOS  --   --  28*  BILITOT  --   --  0.6  PROT  --   --  5.5*  ALBUMIN 4.0 2.9* 3.0*  INR 1.1  --   --      INFECTIOUS Recent Labs  Lab 07/05/19 1148 07/05/19 1200 07/06/19 0423 07/07/19 0530  LATICACIDVEN 1.5  --   --   --   PROCALCITON  --  2.67 1.72 0.76     ENDOCRINE CBG (last 3)  Recent Labs    07/07/19 0327 07/07/19 0742 07/07/19 1146  GLUCAP 135* 142* 132*         IMAGING x48h  - image(s) personally visualized  -   highlighted in bold DG Chest Port 1 View  Result Date: 07/07/2019 CLINICAL DATA:  Acute respiratory failure EXAM: PORTABLE CHEST 1 VIEW COMPARISON:  July 04, 2019 FINDINGS: The right basilar infiltrate on the previous study has significantly improved. Peripheral opacities remain in the right  lung with worsening in the upper lung. The opacity in the left lung is now in a somewhat peripheral location as well. The overall amount of infiltrate in the left lung is similar although the distribution is changed. Stable left central line. No pneumothorax. Cardiomediastinal silhouette is stable. IMPRESSION: 1. Persistent pulmonary infiltrates. The focal infiltrate in the right base has significantly improved. There is increasing peripheral opacity in the right upper lung. The overall amount of infiltrate on the  left is similar although the distribution is changed. The findings are most consistent with an infectious process. Asymmetric edema considered less likely. Electronically Signed   By: Dorise Bullion III M.D   On: 07/07/2019 11:57   ECHOCARDIOGRAM COMPLETE  Result Date: 07/05/2019    ECHOCARDIOGRAM REPORT   Patient Name:   Stephen Beard Date of Exam: 07/05/2019 Medical Rec #:  [id number]          Height:       67.0 in Accession #:    [id number]         Weight:       130.1 lb Date of Birth:  [birth year]          BSA:          1.684 m Patient Age:    23 years           BP:           119/64 mmHg Patient Gender: M                  HR:           78 bpm. Exam Location:  Inpatient Procedure: 2D Echo, Cardiac Doppler and Color Doppler Indications:    Shortness of breath  History:        Patient has no prior history of Echocardiogram examinations.                 Signs/Symptoms:Shortness of Breath. Brain tumor, s/p craniotomy.  Sonographer:    Dustin Flock Referring Phys: [place]  1. Left ventricular ejection fraction, by estimation, is 60 to 65%. The left ventricle has normal function. The left ventricle has no regional wall motion abnormalities. Left ventricular diastolic parameters were normal.  2. Right ventricular systolic function is normal. The right ventricular size is normal.  3. Left atrial size was mildly dilated.  4. The mitral valve is normal in structure. Trivial  mitral valve regurgitation. No evidence of mitral stenosis.  5. The aortic valve is tricuspid. Aortic valve regurgitation is not visualized. No aortic stenosis is present.  6. The inferior vena cava is normal in size with greater than 50% respiratory variability, suggesting right atrial pressure of 3 mmHg. FINDINGS  Left Ventricle: Left ventricular ejection fraction, by estimation, is 60 to 65%. The left ventricle has normal function. The left ventricle has no regional wall motion abnormalities. The left ventricular internal cavity size was normal in size. There is  no left ventricular hypertrophy. Left ventricular diastolic parameters were normal. Right Ventricle: The right ventricular size is normal. No increase in right ventricular wall thickness. Right ventricular systolic function is normal. Left Atrium: Left atrial size was mildly dilated. Right Atrium: Right atrial size was normal in size. Pericardium: There is no evidence of pericardial effusion. Mitral Valve: The mitral valve is normal in structure. Normal mobility of the mitral valve leaflets. Trivial mitral valve regurgitation. No evidence of mitral valve stenosis. Tricuspid Valve: The tricuspid valve is normal in structure. Tricuspid valve regurgitation is mild . No evidence of tricuspid stenosis. Aortic Valve: The aortic valve is tricuspid. Aortic valve regurgitation is not visualized. No aortic stenosis is present. Pulmonic Valve: The pulmonic valve was normal in structure. Pulmonic valve regurgitation is not visualized. No evidence of pulmonic stenosis. Aorta: The aortic root is normal in size and structure. Venous: The inferior vena cava is normal in size with greater than 50% respiratory variability, suggesting right atrial pressure of 3 mmHg. IAS/Shunts:  No atrial level shunt detected by color flow Doppler. Jenkins Rouge MD Electronically signed by Jenkins Rouge MD Signature Date/Time: 07/05/2019/4:36:37 PM    Final

## 2019-07-07 NOTE — Plan of Care (Signed)
  Problem: Clinical Measurements: Goal: Ability to maintain clinical measurements within normal limits will improve Outcome: Progressing   

## 2019-07-07 NOTE — Progress Notes (Signed)
Wide awake and interactive.  Denies significant pain.    He is afebrile.  Vital signs are stable.  He is oxygenating well.  No increased work of breathing.  Awake and aware.  Oriented and reasonably appropriate.  Following commands bilaterally.  Strength symmetric.  Visual field deficit unchanged.  Wound clean and dry.  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  Continue current supportive efforts and ICU monitoring.  I see no indications for continued 3% saline and we will stop this.

## 2019-07-08 ENCOUNTER — Encounter: Payer: Self-pay | Admitting: Anesthesiology

## 2019-07-08 LAB — CBC WITH DIFFERENTIAL/PLATELET
Abs Immature Granulocytes: 1.18 10*3/uL — ABNORMAL HIGH (ref 0.00–0.07)
Basophils Absolute: 0.1 10*3/uL (ref 0.0–0.1)
Basophils Relative: 1 %
Eosinophils Absolute: 0 10*3/uL (ref 0.0–0.5)
Eosinophils Relative: 0 %
HCT: 32.5 % — ABNORMAL LOW (ref 39.0–52.0)
Hemoglobin: 11.2 g/dL — ABNORMAL LOW (ref 13.0–17.0)
Immature Granulocytes: 6 %
Lymphocytes Relative: 8 %
Lymphs Abs: 1.6 10*3/uL (ref 0.7–4.0)
MCH: 31.3 pg (ref 26.0–34.0)
MCHC: 34.5 g/dL (ref 30.0–36.0)
MCV: 90.8 fL (ref 80.0–100.0)
Monocytes Absolute: 1.9 10*3/uL — ABNORMAL HIGH (ref 0.1–1.0)
Monocytes Relative: 9 %
Neutro Abs: 15.4 10*3/uL — ABNORMAL HIGH (ref 1.7–7.7)
Neutrophils Relative %: 76 %
Platelets: 237 10*3/uL (ref 150–400)
RBC: 3.58 MIL/uL — ABNORMAL LOW (ref 4.22–5.81)
RDW: 12.7 % (ref 11.5–15.5)
WBC: 20.2 10*3/uL — ABNORMAL HIGH (ref 4.0–10.5)
nRBC: 0.1 % (ref 0.0–0.2)

## 2019-07-08 LAB — BASIC METABOLIC PANEL
Anion gap: 10 (ref 5–15)
BUN: 19 mg/dL (ref 6–20)
CO2: 26 mmol/L (ref 22–32)
Calcium: 8.3 mg/dL — ABNORMAL LOW (ref 8.9–10.3)
Chloride: 99 mmol/L (ref 98–111)
Creatinine, Ser: 0.72 mg/dL (ref 0.61–1.24)
GFR calc Af Amer: >60 mL/min (ref >60)
GFR calc non Af Amer: >60 mL/min (ref >60)
Glucose, Bld: 144 mg/dL — ABNORMAL HIGH (ref 70–99)
Potassium: 4.2 mmol/L (ref 3.5–5.1)
Sodium: 135 mmol/L (ref 135–145)

## 2019-07-08 LAB — GLUCOSE, CAPILLARY
Glucose-Capillary: 113 mg/dL — ABNORMAL HIGH (ref 70–99)
Glucose-Capillary: 115 mg/dL — ABNORMAL HIGH (ref 70–99)
Glucose-Capillary: 129 mg/dL — ABNORMAL HIGH (ref 70–99)
Glucose-Capillary: 132 mg/dL — ABNORMAL HIGH (ref 70–99)
Glucose-Capillary: 95 mg/dL (ref 70–99)
Glucose-Capillary: 97 mg/dL (ref 70–99)

## 2019-07-08 LAB — MAGNESIUM: Magnesium: 1.9 mg/dL (ref 1.7–2.4)

## 2019-07-08 LAB — SODIUM
Sodium: 136 mmol/L (ref 135–145)
Sodium: 137 mmol/L (ref 135–145)

## 2019-07-08 LAB — PHOSPHORUS: Phosphorus: 3.3 mg/dL (ref 2.5–4.6)

## 2019-07-08 MED ORDER — SENNOSIDES-DOCUSATE SODIUM 8.6-50 MG PO TABS
1.0000 | ORAL_TABLET | Freq: Every day | ORAL | Status: DC
  Administered 2019-07-08 – 2019-07-12 (×5): 1 via ORAL
  Filled 2019-07-08 (×5): qty 1

## 2019-07-08 MED ORDER — DEXAMETHASONE SODIUM PHOSPHATE 4 MG/ML IJ SOLN
4.0000 mg | Freq: Two times a day (BID) | INTRAMUSCULAR | Status: DC
  Administered 2019-07-08: 4 mg via INTRAVENOUS
  Filled 2019-07-08 (×2): qty 1

## 2019-07-08 MED ORDER — POLYETHYLENE GLYCOL 3350 17 G PO PACK
17.0000 g | PACK | Freq: Every day | ORAL | Status: DC
  Administered 2019-07-08 – 2019-07-12 (×5): 17 g via ORAL
  Filled 2019-07-08 (×5): qty 1

## 2019-07-08 NOTE — Progress Notes (Signed)
Inpatient Rehab Admissions:  Inpatient Rehab Consult received.  I met with patient and his father at the bedside for rehabilitation assessment and to discuss goals and expectations of an inpatient rehab admission.  They are both interested in CIR when medically ready.  Note ventricular drain clamped today, possibly removed tomorrow.  Will continue to follow for possible admission later this week pending medical stability and bed availability.   Signed: Shann Medal, PT, DPT Admissions Coordinator [phone] 07/08/19  12:14 PM

## 2019-07-08 NOTE — Progress Notes (Signed)
Occupational Therapy Treatment Patient Details Name: Stephen Beard MRN: [id number] DOB: 05-10-1995 Today's Date: 07/08/2019    History of present illness 24 y.o. admitted on 06/30/2019 with HA progressively worsening for months and blurry vision in L eye. 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 Pt underwent R occipital craniotomy and resection of glioma on 4/20. Pt required return to OR for R craniotomy for hematoma evacuation 2/2 ICH. Pt extubated on 4/22. PMH including ADHD.    OT comments  Pt continues to present with high motivation to participate in therapy and return to PLOF. Pt continues to present with decreased cognition, balance, body awareness, and vision. Pt requiring Min A +2 for functional mobility and demonstrating poor coordination and balance impacting his safety. Pt performing toileting and then hand hygiene at sink with Min A. Pt continues to reports blurry vision requiring Mod-Max cues for vision deficits during ADLs. Pt stating "I just close my eyes out of habit now because it is so blurry." Cueing pt to keep eyes opening, and pt demonstrating good acceptance of cues and education. Continue to highly recommend dc to CIR and will continue to follow acutely as admitted.    Follow Up Recommendations  CIR    Equipment Recommendations  Other (comment)(Defefr to next venue)    Recommendations for Other Services Rehab consult;PT consult;Speech consult    Precautions / Restrictions Precautions Precautions: Fall;Other (comment) Precaution Comments: EVD Restrictions Weight Bearing Restrictions: No       Mobility Bed Mobility Overal bed mobility: Needs Assistance Bed Mobility: Supine to Sit     Supine to sit: Min assist     General bed mobility comments: Min A for trunk support  Transfers Overall transfer level: Needs assistance Equipment used: 1 person hand held assist Transfers: Sit to/from Stand Sit to Stand: Min assist;+2 safety/equipment          General transfer comment: Min A for balance and power up    Balance Overall balance assessment: Needs assistance Sitting-balance support: Single extremity supported;Feet unsupported Sitting balance-Leahy Scale: Fair     Standing balance support: Single extremity supported;During functional activity Standing balance-Leahy Scale: Poor Standing balance comment: Min A for balance                           ADL either performed or assessed with clinical judgement   ADL Overall ADL's : Needs assistance/impaired   Eating/Feeding Details (indicate cue type and reason): Pt able to verbalize the location of eat food items while seated in recliner and table in front of him. Demonstrating good dept perception.  Grooming: Minimal assistance;Standing;Wash/dry hands Grooming Details (indicate cue type and reason): Min A for balance and body awareness while standing at sink to perform hand hygiene.              Lower Body Dressing: Minimal assistance;Sit to/from stand Lower Body Dressing Details (indicate cue type and reason): Min Guard A for sitting balance while donning socks. Pt bringing ankles up to EOB to don socks. Increased cues for initating task. Min A for stanidng balance Toilet Transfer: Minimal assistance;Ambulation;Regular Toilet;Grab bars;+2 for safety/equipment Toilet Transfer Details (indicate cue type and reason): MIn A for safety during descent and then for power up. Requiring Max cues to locate toilet as pt with tendency to close eyes.          Functional mobility during ADLs: Minimal assistance;+2 for physical assistance;+2 for safety/equipment General ADL Comments: Pt presenting  with decreased balance, cognition, and acitvity tolerance.      Vision   Vision Assessment?: Yes Additional Comments: Continues to present blurry vision. Denies that covering one eye helps. Deonstrating better depth perception during grooming and eating.    Perception      Praxis      Cognition Arousal/Alertness: Awake/alert Behavior During Therapy: WFL for tasks assessed/performed Overall Cognitive Status: Impaired/Different from baseline Area of Impairment: Attention;Memory;Following commands;Safety/judgement;Awareness;Problem solving                   Current Attention Level: Sustained;Selective Memory: Decreased recall of precautions;Decreased short-term memory Following Commands: Follows one step commands consistently;Follows multi-step commands with increased time(requires verbal cues) Safety/Judgement: Decreased awareness of safety;Decreased awareness of deficits Awareness: Emergent Problem Solving: Slow processing;Requires verbal cues General Comments: Pt very motivated. Requiring increased cues and continues to demonstrating poor ST memory. Able to sustain attention to tasks.         Exercises     Shoulder Instructions       General Comments Father present during session. SpO2 dropping to 86% on 4L and requiring 6L during activity.    Pertinent Vitals/ Pain       Pain Assessment: Faces Faces Pain Scale: Hurts even more Pain Location: head Pain Descriptors / Indicators: Grimacing Pain Intervention(s): Monitored during session;Limited activity within patient's tolerance;Repositioned  Home Living                                          Prior Functioning/Environment              Frequency  Min 3X/week        Progress Toward Goals  OT Goals(current goals can now be found in the care plan section)  Progress towards OT goals: Progressing toward goals  Acute Rehab OT Goals Patient Stated Goal: To return to independence OT Goal Formulation: With patient Time For Goal Achievement: 07/19/19 Potential to Achieve Goals: Good ADL Goals Pt Will Perform Grooming: with set-up;with supervision;sitting Pt Will Perform Upper Body Dressing: with set-up;with supervision;sitting Pt Will Transfer to Toilet:  with min assist;ambulating;bedside commode Pt Will Perform Toileting - Clothing Manipulation and hygiene: sitting/lateral leans;with min guard assist;sit to/from stand Additional ADL Goal #1: Pt will demonstrate selective attention during ADLs with 2-3 cues Additional ADL Goal #2: Pt will recall and perform three ADLs with Min cues  Plan Discharge plan remains appropriate    Co-evaluation    PT/OT/SLP Co-Evaluation/Treatment: Yes Reason for Co-Treatment: For patient/therapist safety;To address functional/ADL transfers   OT goals addressed during session: ADL's and self-care      AM-PAC OT "6 Clicks" Daily Activity     Outcome Measure   Help from another person eating meals?: A Little Help from another person taking care of personal grooming?: A Little Help from another person toileting, which includes using toliet, bedpan, or urinal?: A Lot Help from another person bathing (including washing, rinsing, drying)?: A Lot Help from another person to put on and taking off regular upper body clothing?: A Little Help from another person to put on and taking off regular lower body clothing?: A Lot 6 Click Score: 15    End of Session Equipment Utilized During Treatment: Oxygen(6L O2)  OT Visit Diagnosis: Unsteadiness on feet (R26.81);Other abnormalities of gait and mobility (R26.89);Muscle weakness (generalized) (M62.81);Pain Pain - part of body: (HA)   Activity Tolerance Patient tolerated  treatment well   Patient Left in chair;with call bell/phone within reach;with family/visitor present   Nurse Communication Mobility status        Time: 1354-1426 OT Time Calculation (min): 32 min  Charges: OT General Charges $OT Visit: 1 Visit OT Treatments $Self Care/Home Management : 8-22 mins  [place], OTR/L Acute Rehab Pager: [phone] Office: [place] 07/08/2019, 2:46 PM

## 2019-07-08 NOTE — Progress Notes (Signed)
Neurosurgery Service Progress Note  Subjective: NAE ON, headache slowly improving  Objective: Vitals:   07/08/19 0600 07/08/19 0630 07/08/19 0700 07/08/19 0730  BP: 116/63 111/62 130/69 133/80  Pulse: (!) 58 (!) 53 71 66  Resp: 15 15 (!) 26 (!) 28  Temp:      TempSrc:      SpO2: 100% 98% 100% 90%  Weight:      Height:       Temp (24hrs), Avg:98.9 F (37.2 C), Min:98 F (36.7 C), Max:99.6 F (37.6 C)  CBC Latest Ref Rng & Units 07/08/2019 07/07/2019 07/06/2019  WBC 4.0 - 10.5 K/uL 20.2(H) 14.1(H) 14.4(H)  Hemoglobin 13.0 - 17.0 g/dL 11.2(L) 10.0(L) 9.7(L)  Hematocrit 39.0 - 52.0 % 32.5(L) 29.4(L) 28.9(L)  Platelets 150 - 400 K/uL 237 190 167   BMP Latest Ref Rng & Units 07/08/2019 07/07/2019 07/07/2019  Glucose 70 - 99 mg/dL 144(H) - -  BUN 6 - 20 mg/dL 19 - -  Creatinine 0.61 - 1.24 mg/dL 0.72 - -  Sodium 135 - 145 mmol/L 135 135 138  Potassium 3.5 - 5.1 mmol/L 4.2 - -  Chloride 98 - 111 mmol/L 99 - -  CO2 22 - 32 mmol/L 26 - -  Calcium 8.9 - 10.3 mg/dL 8.3(L) - -    Intake/Output Summary (Last 24 hours) at 07/08/2019 0814 Last data filed at 07/08/2019 0730 Gross per 24 hour  Intake 434.44 ml  Output 4869 ml  Net -4434.56 ml    Current Facility-Administered Medications:  .  0.9 %  sodium chloride infusion, , Intravenous, PRN, Shellia Cleverly, MD .  acetaminophen (TYLENOL) tablet 650 mg, 650 mg, Oral, Q6H PRN, 650 mg at 07/08/19 0242 **OR** acetaminophen (TYLENOL) suppository 650 mg, 650 mg, Rectal, Q6H PRN, Judith Part, MD .  Ampicillin-Sulbactam (UNASYN) 3 g in sodium chloride 0.9 % 100 mL IVPB, 3 g, Intravenous, Q6H, Margreat Widener A, MD, Last Rate: 200 mL/hr at 07/08/19 0511, 3 g at 07/08/19 0511 .  bethanechol (URECHOLINE) tablet 10 mg, 10 mg, Oral, QID, Bergman, Meghan D, NP, 10 mg at 07/07/19 2102 .  Chlorhexidine Gluconate Cloth 2 % PADS 6 each, 6 each, Topical, Daily, Judith Part, MD, 6 each at 07/07/19 1030 .  dexamethasone (DECADRON)  injection 4 mg, 4 mg, Intravenous, Q12H, Barnett Elzey A, MD .  HYDROmorphone (DILAUDID) injection 0.5 mg, 0.5 mg, Intravenous, Q2H PRN, Judith Part, MD, 0.5 mg at 07/06/19 2038 .  HYDROmorphone (DILAUDID) injection 1 mg, 1 mg, Intravenous, Q2H PRN, Judith Part, MD, 1 mg at 07/07/19 1147 .  insulin aspart (novoLOG) injection 0-9 Units, 0-9 Units, Subcutaneous, Q4H, Jennelle Human B, NP, 1 Units at 07/08/19 0436 .  ipratropium-albuterol (DUONEB) 0.5-2.5 (3) MG/3ML nebulizer solution 3 mL, 3 mL, Nebulization, Q4H PRN, Jennelle Human B, NP .  melatonin tablet 6 mg, 6 mg, Oral, QHS, Vallarie Mare, MD, 6 mg at 07/07/19 2102 .  metoCLOPramide (REGLAN) injection 10 mg, 10 mg, Intravenous, Q6H, Severn Goddard A, MD, 10 mg at 07/08/19 0511 .  nicotine (NICODERM CQ - dosed in mg/24 hours) patch 21 mg, 21 mg, Transdermal, Daily, Judith Part, MD, 21 mg at 07/07/19 0919 .  ondansetron (ZOFRAN) tablet 4 mg, 4 mg, Oral, Q6H PRN **OR** ondansetron (ZOFRAN) injection 4 mg, 4 mg, Intravenous, Q6H PRN, Judith Part, MD, 4 mg at 07/04/19 2346 .  oxyCODONE (Oxy IR/ROXICODONE) immediate release tablet 10 mg, 10 mg, Oral, Q4H PRN, Judith Part, MD,  10 mg at 07/07/19 2202 .  oxyCODONE (OXYCONTIN) 12 hr tablet 15 mg, 15 mg, Oral, Q12H, Ashok Pall, MD, 15 mg at 07/07/19 2102 .  pantoprazole (PROTONIX) EC tablet 40 mg, 40 mg, Oral, Daily, [place], Donalynn Furlong, [place] .  senna-docusate (Senokot-S) tablet 1 tablet, 1 tablet, Oral, QHS PRN, Meyran, [name], NP .  sodium chloride flush (NS) 0.9 % injection 10-40 mL, 10-40 mL, Intracatheter, Q12H, Shellia Cleverly, MD, 20 mL at 07/07/19 2103 .  sodium chloride flush (NS) 0.9 % injection 10-40 mL, 10-40 mL, Intracatheter, PRN, Shellia Cleverly, MD   Physical Exam: Awake/alert, Ox3, PERRL, gaze neutral, +dense L field cut, FCx4 with full strength Incision c/d/i Drain w/ serosang output  Assessment & Plan: 24 y.o. man w/  progressive headaches and multiple progressive neurologic abnormalities, MRI with large right multifocal occipital mass that appears to track along the ventricle into the thalamus with a small contralateral component. 4/20 s/p craniotomy for resection, 4/21 post-op MRI with good resection, resection bed hematoma, midline shift decreased but still 34mm. 4/21 likely seizure, blown pupil, emergently taken to OR x2 for hematoma evacuation, post-op CTH w/ good evacuation, expected residual in deep cavity, 4/22 awake/FC on vent, b/l infiltrates on CXR likely 2/2 aspiration during seizure, 4/22 extubated, final path   -CCM recs, CXR as expected given pneumonitis -final path anaplastic astro, will discuss with patient & family once he is out of the ICU -dex 4bid -off 3%, already self-corrected -will clamp drain today and remove tomorrow if neurologically stable -SCDs/TEDs, hold SQH until tomorrow after drain placement  Marcello Moores A Kamesha Herne  07/08/19 8:14 AM

## 2019-07-08 NOTE — Progress Notes (Signed)
Physical Therapy Treatment Patient Details Name: Stephen Beard MRN: [id number] DOB: [birth year] Today's Date: 07/08/2019    History of Present Illness 24 y.o. admitted on 06/30/2019 with HA progressively worsening for months and blurry vision in L eye. 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 Pt underwent R occipital craniotomy and resection of glioma on 4/20. Pt required return to OR for R craniotomy for hematoma evacuation 2/2 ICH. Pt extubated on 4/22. PMH including ADHD.     PT Comments    Pt reports his vision is improving, still blurry, but he is starting to be able to read some larger print signs on the wall.  He remains unstable on his feet requiring two person heavy min assist for safety and balance during in room gait.  Activity increases his HA and he needs cues to keep his eyes open during mobility.  He remains appropriate for intensive post acute rehab at discharge.    Follow Up Recommendations  CIR;Supervision/Assistance - 24 hour     Equipment Recommendations  None recommended by PT    Recommendations for Other Services Rehab consult     Precautions / Restrictions Precautions Precautions: Fall;Other (comment) Precaution Comments: EVD- clamped all day today Restrictions Weight Bearing Restrictions: No    Mobility  Bed Mobility Overal bed mobility: Needs Assistance Bed Mobility: Supine to Sit     Supine to sit: Min assist     General bed mobility comments: Min A for trunk support  Transfers Overall transfer level: Needs assistance Equipment used: 1 person hand held assist Transfers: Sit to/from Stand Sit to Stand: Min assist;+2 safety/equipment         General transfer comment: Min A for balance and power up.  Wide BOS for stability in standing with (+) sway.  Ambulation/Gait Ambulation/Gait assistance: Min assist;+2 safety/equipment Gait Distance (Feet): 15 Feet Assistive device: 1 person hand held assist Gait Pattern/deviations:  Step-through pattern;Staggering right;Leaning posteriorly;Staggering left;Wide base of support Gait velocity: deecreased Gait velocity interpretation: <1.8 ft/sec, indicate of risk for recurrent falls General Gait Details: Pt with wide BOS, staggering ataxic gait patter, decreased visual input and pt report some dizziness and HA with mobility.    Stairs             Wheelchair Mobility    Modified Rankin (Stroke Patients Only)       Balance Overall balance assessment: Needs assistance Sitting-balance support: Feet supported;Bilateral upper extremity supported Sitting balance-Leahy Scale: Poor Sitting balance - Comments: at times min assist due to posterior LOB and sway.  Pt does well with tactile input to help him know where he is in space Postural control: Posterior lean Standing balance support: Bilateral upper extremity supported;Single extremity supported Standing balance-Leahy Scale: Poor Standing balance comment: min assist at sink washing hands for balance, wide BOS for stability.                             Cognition Arousal/Alertness: Awake/alert Behavior During Therapy: WFL for tasks assessed/performed                                   General Comments: Not specifically tested by PT      Exercises      General Comments        Pertinent Vitals/Pain Pain Assessment: 0-10 Pain Score: 4  Pain Location: head Pain Descriptors / Indicators: Discomfort;Grimacing  Pain Intervention(s): Limited activity within patient's tolerance;Monitored during session;Repositioned           PT Goals (current goals can now be found in the care plan section) Acute Rehab PT Goals Patient Stated Goal: To return to independence, improve vision Progress towards PT goals: Progressing toward goals    Frequency    Min 4X/week      PT Plan Current plan remains appropriate    Co-evaluation PT/OT/SLP Co-Evaluation/Treatment: Yes Reason for  Co-Treatment: Complexity of the patient's impairments (multi-system involvement);Necessary to address cognition/behavior during functional activity;For patient/therapist safety;To address functional/ADL transfers PT goals addressed during session: Mobility/safety with mobility;Balance;Strengthening/ROM        AM-PAC PT "6 Clicks" Mobility   Outcome Measure  Help needed turning from your back to your side while in a flat bed without using bedrails?: A Little Help needed moving from lying on your back to sitting on the side of a flat bed without using bedrails?: A Little Help needed moving to and from a bed to a chair (including a wheelchair)?: A Little Help needed standing up from a chair using your arms (e.g., wheelchair or bedside chair)?: A Little Help needed to walk in hospital room?: A Little Help needed climbing 3-5 steps with a railing? : A Lot 6 Click Score: 17    End of Session   Activity Tolerance: Patient limited by pain Patient left: in chair;with call bell/phone within reach;with chair alarm set;with family/visitor present Nurse Communication: Mobility status PT Visit Diagnosis: Unsteadiness on feet (R26.81);Other abnormalities of gait and mobility (R26.89);Other symptoms and signs involving the nervous system [id number])     Time: [id number] PT Time Calculation (min) (ACUTE ONLY): 32 min  Charges:  $Gait Training: 8-22 mins                    Verdene Lennert, PT, DPT  Acute Rehabilitation [phone] pager #[phone] office     07/08/2019, 11:13 PM

## 2019-07-08 NOTE — Anesthesia Postprocedure Evaluation (Signed)
Anesthesia Post Note  Patient: Stephen Beard  Procedure(s) Performed: CRANIOTOMY TUMOR EXCISION with BRAINLAB (Right Head) APPLICATION OF CRANIAL NAVIGATION (N/A )     Patient location during evaluation: PACU Anesthesia Type: General Level of consciousness: patient cooperative and awake Pain management: pain level controlled Vital Signs Assessment: post-procedure vital signs reviewed and stable Respiratory status: spontaneous breathing, nonlabored ventilation, respiratory function stable and patient connected to nasal cannula oxygen Cardiovascular status: blood pressure returned to baseline and stable Postop Assessment: no apparent nausea or vomiting Anesthetic complications: no    Last Vitals:  Vitals:   07/08/19 1500 07/08/19 1600  BP: 117/68 116/67  Pulse: 80 78  Resp: 20 (!) 25  Temp:    SpO2: 95% 97%    Last Pain:  Vitals:   07/08/19 1200  TempSrc: Oral  PainSc: 4                  Shanie Mauzy

## 2019-07-08 NOTE — Progress Notes (Signed)
NAME:  Stephen Beard, MRN:  [id number], DOB:  [birth year], LOS: 8 ADMISSION DATE:  06/30/2019, CONSULTATION DATE: 07/03/2019 REFERRING MD: Dr. Zada Finders, CHIEF COMPLAINT: Status post hematoma evacuation  Brief History    Patient is a 24 year old admitted on the 18th with a new diagnosis of parietal and occipital glioma.  He underwent resection yesterday with postoperative hematoma requiring a trip to the operating room this evening for evacuation.  He seen post operatively in the neuro ICU.  [place] as he is heavily sedated on fentanyl and propofol mechanically ventilated.  He does withdraw to pain.  His pupils are 2 mm and not light responsive.  He does overbreathing the ventilator.  Laboratories are wholly unremarkable.  He is on Decadron.  Currently the patient is relatively hypoxemic on 100% FiO2 with O2 saturation 100% PEEP of 7.  His lungs are coarse in both lung fields.  Chest x-ray postoperatively is pending.  Past Medical History  ADHD on Adderall  Significant Hospital Events   07/02/2019: Section parietal-occipital glioma 07/03/2019: Postoperative hematoma evacuation in OR 4/22 - extubated but hypoxemic needing face mask. CXR with significant infiltrates 4/23 - -remains extubated with good wob but still very hypoxemci needing face mask. Thirsty. On 3% saline. Headache less but still + per NSGY. Had fever to 103F yesterday. WBC up. On D3 unasyn 4/24 -  afebrile x > 24h. WBC 14.4K. Still on face mask o2. He feels it is too much.  4/25 - 91% on [place]  4-6 L [place] whe coughs but better. . Off 3% saline. Did get lasix yesterday. CXR better.   Consults:  PCCM  Procedures:  OR 4/20 and 4/21 ETT   4/20; 4/21>> 4/22 Left subclavian CVL  4/21 >> 4/23 EVD 4/21 >>  Significant Diagnostic Tests:  4/18 [facility] >> Large solid mass lesions which are partially calcified noted in the right parietal and occipital lobes with 21 mm of right to left midline shift. Recommend further evaluation with  MRI.  4/18 MRI brain >> Clustered enhancing masses of the right occipital lobe causing 10 mm of leftward midline shift with mass effect on the lateral ventricles. Differential considerations include oligodendroglioma, glioblastoma multiforme, extraventricular neurocytoma and metastatic disease  4/19 [facility] >> CT imaging performed for the purposes of intraoperative navigation.  Redemonstrated are multiple parenchymal masses centered within the right parietooccipital region, some of which have associated calcification. Prominent edema within the posterior right cerebral hemisphere and extending into the corpus callosum. Please refer to recent MRI head 06/30/2019 for favored differential considerations.  Mass effect with significant partial effacement of the right lateral and third ventricles. Unchanged 10 mm leftward midline shift.  4/21 [facility]  >> Postoperative changes with residual hematoma in the right parietal operative bed, mildly smaller than on today's earlier MRI. Similar degree of edema and mass effect with 17 mm of midline shift.  4/21 [facility] >>  Decreased size of right parietal hematoma status post evacuation and drain placement. Persistent extensive vasogenic edema in the posterior right cerebral hemisphere with decreased leftward midline shift, now 12 mm.  4/21 MRI brain >> Recent right parieto-occipital craniotomy for tumor debulking. The vast majority of the enhancing tumor has been resected. Residual material in the operative space could be a combination of poorly enhancing tumor, blood products and packing material. Vasogenic edema appears similar. Mass effect with sub falcine herniation appears similar. Slight increased prominence of the right occipital horn and temporal horn could be due to diminished mass-effect upon those structures or  could possibly represent an element of trapping. The changes minor.  Micro Data:  4/19 SARS 2 >> neg 4/24 MRSA PCR >> neg  4/20 surgical pathology R  occipital tumor >> Anaplastic oligodendroglioma, WHO grade III.  Antimicrobials:  Bacitracin 4/20 >> 420 Ancelf 4/20 >> 4/20 Unasyn 4/21 >>  Interim history/subjective:   EVD to be clamped today with plans to remove tomorrow  Remains afebrile  Net negative 5.6L after diuresis yesterday  States he feels much better today, headache waxes/wanes, blurry vision is improving.  Hungry.   Objective   Blood pressure 122/74, pulse 73, temperature 98.4 F (36.9 C), temperature source Oral, resp. rate 18, height 5\' 7"  (1.702 m), weight 59 kg, SpO2 90 %.        Intake/Output Summary (Last 24 hours) at 07/08/2019 1116 Last data filed at 07/08/2019 0900 Gross per 24 hour  Intake 959.88 ml  Output 4985 ml  Net -4025.12 ml   Filed Weights   07/02/19 0725  Weight: 59 kg   General:  Young adult male lying in bed in NAD HEENT: MM pink/moist, pupils 4/reactive, anicteric, EVD clamped, bulky dry posterior dressing Neuro: alert, oriented, MAE CV: rr, no murmur PULM:  Non labored, diminished in bases, scattered rhonchi, clear sputum production, currently on 5L [place] GI: soft, bsx4 active  Extremities: warm/dry, no edema  Skin: no rashes   Resolved Hospital Problem list   Thrombocytopenia on 4/23  Assessment & Plan:  Status post resection of a parietal and occipital glioma 4/20, status post hematoma evacuation 4/21  - per NSGY  - surgical pathology showing anaplastic oligodendroglioma, WHO grade III - hypertonic saline off 4/25 - ongoing decadron 4mg  q 12hr - EVD to be clamped today and hopeful for removal tomorrow  - ongoing serial neuro exams   Acute hypoxemic resp failure due to infiltrates/ pneumonitis  - intubated 4/21 - extubated 4/22 - continue aggressive pulmonary toliet - wean supplemental O2 for sat goal > 92% - unasyn for 7 day course - hold on further diuresis   Concern for sepsis due to pneumonia NOS. [place] strep neg -  continue unasyn as above for total 7 days - trend wBC  / fever curve   Anemia of ICU/ post operative  - stable trend   Electrolyte imbalance - trend BMP/ mag  Urinary retention - started on urecholine 4/23, trial of foley out tomorrow  Best practice:  Diet: thin, advance as tolerated  Pain/Anxiety/Delirium protocol (if indicated): none VAP protocol (if indicated): Yes DVT prophylaxis: SCDs -> change to  lovenox per NSGY timing (after EVD removed) GI prophylaxis: Protonix with steroids Glucose control: SSI  Mobility: PT/OT Code Status: Full Family Communication: patient at bedside Disposition: ICU  LABS    PULMONARY Recent Labs  Lab 07/02/19 1513 07/02/19 1634 07/03/19 1638 07/03/19 1849 07/03/19 2117  PHART 7.409 7.377 7.158* 7.302* 7.274*  PCO2ART 33.3 37.3 54.4* 45.5 45.4  PO2ART 232.0* 257.0* 76* 104 69*  HCO3 21.3 22.1 19.3* 22.5 21.1  TCO2 22 23 21* 24 22  O2SAT 100.0 100.0 90.0 97.0 91.0    CBC Recent Labs  Lab 07/06/19 0423 07/07/19 0530 07/08/19 0252  HGB 9.7* 10.0* 11.2*  HCT 28.9* 29.4* 32.5*  WBC 14.4* 14.1* 20.2*  PLT 167 190 237    COAGULATION Recent Labs  Lab 07/03/19 0900  INR 1.1    CARDIAC  No results for input(s): TROPONINI in the last 168 hours. No results for input(s): PROBNP in the last 168 hours.  CHEMISTRY Recent Labs  Lab 07/03/19 0900 07/03/19 1638 07/04/19 0458 07/04/19 0806 07/05/19 0422 07/05/19 [id number] 07/06/19 0423 07/06/19 0831 07/07/19 0232 07/07/19 0530 07/07/19 0815 07/07/19 1414 07/07/19 2158 07/08/19 0252 07/08/19 0847  NA 136   < > 140   < > 145   < >  --    < >   < >  --  140 138 135 135 136  K 3.5   < > 3.7   < > 3.8  --   --   --   --   --   --   --   --  4.2  --   CL 101  --  111  --  112*  --   --   --   --   --   --   --   --  99  --   CO2 24  --  22  --  23  --   --   --   --   --   --   --   --  26  --   GLUCOSE 147*  --  120*  --  133*  --   --   --   --   --   --   --   --  144*  --   BUN 7  --  6  --  14  --   --   --   --   --   --   --    --  19  --   CREATININE 0.61  --  0.66  --  0.73  --   --   --   --   --   --   --   --  0.72  --   CALCIUM 8.5*  --  7.7*  --  8.5*  --   --   --   --   --   --   --   --  8.3*  --   MG  --   --   --   --  2.1  --  2.0  --   --  1.7  --   --   --  1.9  --   PHOS 3.1  --  1.4*  --  2.5  --  2.3*  --   --  2.8  --   --   --  3.3  --    < > = values in this interval not displayed.   Estimated Creatinine Clearance: 118.8 mL/min (by C-G formula based on SCr of 0.72 mg/dL).   LIVER Recent Labs  Lab 07/03/19 0900 07/04/19 0458 07/05/19 0422  AST  --   --  33  ALT  --   --  18  ALKPHOS  --   --  28*  BILITOT  --   --  0.6  PROT  --   --  5.5*  ALBUMIN 4.0 2.9* 3.0*  INR 1.1  --   --      INFECTIOUS Recent Labs  Lab 07/05/19 1148 07/05/19 1200 07/06/19 0423 07/07/19 0530  LATICACIDVEN 1.5  --   --   --   PROCALCITON  --  2.67 1.72 0.76     ENDOCRINE CBG (last 3)  Recent Labs    07/08/19 0020 07/08/19 0435 07/08/19 0736  GLUCAP 129* 132* 115*    IMAGING x48h  - image(s) personally visualized  -   highlighted in bold DG  Chest Port 1 View  Result Date: 07/07/2019 CLINICAL DATA:  Acute respiratory failure EXAM: PORTABLE CHEST 1 VIEW COMPARISON:  July 04, 2019 FINDINGS: The right basilar infiltrate on the previous study has significantly improved. Peripheral opacities remain in the right lung with worsening in the upper lung. The opacity in the left lung is now in a somewhat peripheral location as well. The overall amount of infiltrate in the left lung is similar although the distribution is changed. Stable left central line. No pneumothorax. Cardiomediastinal silhouette is stable. IMPRESSION: 1. Persistent pulmonary infiltrates. The focal infiltrate in the right base has significantly improved. There is increasing peripheral opacity in the right upper lung. The overall amount of infiltrate on the left is similar although the distribution is changed. The findings are most  consistent with an infectious process. Asymmetric edema considered less likely. Electronically Signed   By: Dorise Bullion III M.D   On: 07/07/2019 11:57    Kennieth Rad, MSN, AGACNP-BC [facility] 07/08/2019, 11:16 AM

## 2019-07-09 LAB — CBC
HCT: 32.2 % — ABNORMAL LOW (ref 39.0–52.0)
Hemoglobin: 10.9 g/dL — ABNORMAL LOW (ref 13.0–17.0)
MCH: 31.3 pg (ref 26.0–34.0)
MCHC: 33.9 g/dL (ref 30.0–36.0)
MCV: 92.5 fL (ref 80.0–100.0)
Platelets: 256 10*3/uL (ref 150–400)
RBC: 3.48 MIL/uL — ABNORMAL LOW (ref 4.22–5.81)
RDW: 12.9 % (ref 11.5–15.5)
WBC: 18.4 10*3/uL — ABNORMAL HIGH (ref 4.0–10.5)
nRBC: 0.2 % (ref 0.0–0.2)

## 2019-07-09 LAB — GLUCOSE, CAPILLARY
Glucose-Capillary: 115 mg/dL — ABNORMAL HIGH (ref 70–99)
Glucose-Capillary: 79 mg/dL (ref 70–99)
Glucose-Capillary: 122 mg/dL — ABNORMAL HIGH (ref 70–99)
Glucose-Capillary: 119 mg/dL — ABNORMAL HIGH (ref 70–99)
Glucose-Capillary: 102 mg/dL — ABNORMAL HIGH (ref 70–99)

## 2019-07-09 LAB — RENAL FUNCTION PANEL
Albumin: 2.8 g/dL — ABNORMAL LOW (ref 3.5–5.0)
Anion gap: 9 (ref 5–15)
BUN: 23 mg/dL — ABNORMAL HIGH (ref 6–20)
CO2: 28 mmol/L (ref 22–32)
Calcium: 8.6 mg/dL — ABNORMAL LOW (ref 8.9–10.3)
Chloride: 97 mmol/L — ABNORMAL LOW (ref 98–111)
Creatinine, Ser: 0.73 mg/dL (ref 0.61–1.24)
GFR calc Af Amer: >60 mL/min (ref >60)
GFR calc non Af Amer: >60 mL/min (ref >60)
Glucose, Bld: 128 mg/dL — ABNORMAL HIGH (ref 70–99)
Phosphorus: 3.5 mg/dL (ref 2.5–4.6)
Potassium: 4.2 mmol/L (ref 3.5–5.1)
Sodium: 134 mmol/L — ABNORMAL LOW (ref 135–145)

## 2019-07-09 LAB — SODIUM
Sodium: 133 mmol/L — ABNORMAL LOW (ref 135–145)
Sodium: 136 mmol/L (ref 135–145)
Sodium: 136 mmol/L (ref 135–145)
Sodium: 137 mmol/L (ref 135–145)

## 2019-07-09 MED ORDER — DEXAMETHASONE 4 MG PO TABS
2.0000 mg | ORAL_TABLET | Freq: Two times a day (BID) | ORAL | Status: DC
  Administered 2019-07-09 (×2): 2 mg via ORAL
  Filled 2019-07-09 (×2): qty 1

## 2019-07-09 MED ORDER — LIDOCAINE HCL (PF) 1 % IJ SOLN
INTRAMUSCULAR | Status: AC
  Filled 2019-07-09: qty 5

## 2019-07-09 MED ORDER — LIDOCAINE HCL (PF) 1 % IJ SOLN
INTRAMUSCULAR | Status: AC
  Filled 2019-07-09: qty 30

## 2019-07-09 MED ORDER — HEPARIN SODIUM (PORCINE) 5000 UNIT/ML IJ SOLN
5000.0000 [IU] | Freq: Three times a day (TID) | INTRAMUSCULAR | Status: DC
  Administered 2019-07-09 – 2019-07-12 (×8): 5000 [IU] via SUBCUTANEOUS
  Filled 2019-07-09 (×9): qty 1

## 2019-07-09 NOTE — Progress Notes (Signed)
Neurosurgery Service Progress Note  Subjective: NAE ON, headache improving, no change with drain clamped  Objective: Vitals:   07/09/19 0600 07/09/19 0700 07/09/19 0800 07/09/19 1000  BP: 107/61 108/60  120/77  Pulse: 61 62 78   Resp: 12 16    Temp:   98.7 F (37.1 C)   TempSrc:   Oral   SpO2: 95% 95% 94%   Weight:      Height:       Temp (24hrs), Avg:98.8 F (37.1 C), Min:97.9 F (36.6 C), Max:99.3 F (37.4 C)  CBC Latest Ref Rng & Units 07/09/2019 07/08/2019 07/07/2019  WBC 4.0 - 10.5 K/uL 18.4(H) 20.2(H) 14.1(H)  Hemoglobin 13.0 - 17.0 g/dL 10.9(L) 11.2(L) 10.0(L)  Hematocrit 39.0 - 52.0 % 32.2(L) 32.5(L) 29.4(L)  Platelets 150 - 400 K/uL 256 237 190   BMP Latest Ref Rng & Units 07/09/2019 07/09/2019 07/08/2019  Glucose 70 - 99 mg/dL - 128(H) -  BUN 6 - 20 mg/dL - 23(H) -  Creatinine 0.61 - 1.24 mg/dL - 0.73 -  Sodium 135 - 145 mmol/L 136 134(L) 133(L)  Potassium 3.5 - 5.1 mmol/L - 4.2 -  Chloride 98 - 111 mmol/L - 97(L) -  CO2 22 - 32 mmol/L - 28 -  Calcium 8.9 - 10.3 mg/dL - 8.6(L) -    Intake/Output Summary (Last 24 hours) at 07/09/2019 1023 Last data filed at 07/09/2019 0800 Gross per 24 hour  Intake 1019.91 ml  Output 475 ml  Net 544.91 ml    Current Facility-Administered Medications:  .  0.9 %  sodium chloride infusion, , Intravenous, PRN, Shellia Cleverly, MD .  acetaminophen (TYLENOL) tablet 650 mg, 650 mg, Oral, Q6H PRN, 650 mg at 07/08/19 1647 **OR** acetaminophen (TYLENOL) suppository 650 mg, 650 mg, Rectal, Q6H PRN, Judith Part, MD .  Ampicillin-Sulbactam (UNASYN) 3 g in sodium chloride 0.9 % 100 mL IVPB, 3 g, Intravenous, Q6H, Jennelle Human B, NP, Stopped at 07/09/19 [phone]  bethanechol (URECHOLINE) tablet 10 mg, 10 mg, Oral, QID, Bergman, Meghan D, NP, 10 mg at 07/08/19 1748 .  Chlorhexidine Gluconate Cloth 2 % PADS 6 each, 6 each, Topical, Daily, Judith Part, MD, 6 each at 07/08/19 1335 .  dexamethasone (DECADRON) injection 4 mg, 4 mg,  Intravenous, Q12H, Asaiah Hunnicutt, Joyice Faster, MD, 4 mg at 07/08/19 2303 .  HYDROmorphone (DILAUDID) injection 0.5 mg, 0.5 mg, Intravenous, Q2H PRN, Judith Part, MD, 0.5 mg at 07/06/19 2038 .  HYDROmorphone (DILAUDID) injection 1 mg, 1 mg, Intravenous, Q2H PRN, Judith Part, MD, 1 mg at 07/07/19 1147 .  insulin aspart (novoLOG) injection 0-9 Units, 0-9 Units, Subcutaneous, Q4H, Jennelle Human B, NP, 1 Units at 07/08/19 0436 .  ipratropium-albuterol (DUONEB) 0.5-2.5 (3) MG/3ML nebulizer solution 3 mL, 3 mL, Nebulization, Q4H PRN, Simpson, Paula B, NP .  lidocaine (PF) (XYLOCAINE) 1 % injection, , , ,  .  melatonin tablet 6 mg, 6 mg, Oral, QHS, Vallarie Mare, MD, 6 mg at 07/08/19 2300 .  metoCLOPramide (REGLAN) injection 10 mg, 10 mg, Intravenous, Q6H, Louann Hopson A, MD, 10 mg at 07/09/19 [id number] .  nicotine (NICODERM CQ - dosed in mg/24 hours) patch 21 mg, 21 mg, Transdermal, Daily, Judith Part, MD, 21 mg at 07/08/19 0939 .  ondansetron (ZOFRAN) tablet 4 mg, 4 mg, Oral, Q6H PRN **OR** ondansetron (ZOFRAN) injection 4 mg, 4 mg, Intravenous, Q6H PRN, Judith Part, MD, 4 mg at 07/04/19 2346 .  oxyCODONE (Oxy IR/ROXICODONE) immediate release  tablet 10 mg, 10 mg, Oral, Q4H PRN, Judith Part, MD, 10 mg at 07/09/19 0332 .  oxyCODONE (OXYCONTIN) 12 hr tablet 15 mg, 15 mg, Oral, Q12H, Ashok Pall, MD, 15 mg at 07/08/19 2300 .  pantoprazole (PROTONIX) EC tablet 40 mg, 40 mg, Oral, Daily, Alvira Philips, Guadalupe, 40 mg at 07/08/19 [id number] .  polyethylene glycol (MIRALAX / GLYCOLAX) packet 17 g, 17 g, Oral, Daily, Simpson, Paula B, NP, 17 g at 07/08/19 1310 .  senna-docusate (Senokot-S) tablet 1 tablet, 1 tablet, Oral, Daily, Jennelle Human B, NP, 1 tablet at 07/08/19 1309 .  sodium chloride flush (NS) 0.9 % injection 10-40 mL, 10-40 mL, Intracatheter, Q12H, Shellia Cleverly, MD, 10 mL at 07/09/19 1017 .  sodium chloride flush (NS) 0.9 % injection 10-40 mL, 10-40 mL,  Intracatheter, PRN, Shellia Cleverly, MD   Physical Exam: Awake/alert, Ox3, PERRL, gaze neutral, +dense L field cut, FCx4 with full strength Incision c/d/i Drain w/ some serous drainage around drain catheter  Assessment & Plan: 24 y.o. 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 4/20 s/p craniotomy for resection, 4/21 post-op MRI with good resection, resection bed hematoma, midline shift decreased but still 29mm. 4/21 likely seizure, blown pupil, emergently taken to OR x2 for hematoma evacuation, post-op CTH w/ good evacuation, expected residual in deep cavity, 4/22 awake/FC on vent, b/l infiltrates on CXR likely 2/2 aspiration during seizure, 4/22 extubated, final path   -dex to 2bid po -drain d/c'd at bedside today. Given that he was leaking CSF around the catheter overnight, this was not a true clamp trial - will keep him in the unit overnight. If neurologically stable, will transfer out of the unit tomorrow. -final path anaplastic astro, will discuss with patient & family once he is out of the ICU -SCDs/TEDs, start [place]  07/09/19 10:23 AM

## 2019-07-09 NOTE — Progress Notes (Signed)
Was the fall witnessed: yes- Leverne Humbles, RN  Patient condition before and after the fall: stable   Patient's reaction to the fall: laughed  Name of the doctor that was notified including date and time: Dr. Zada Finders 07/09/19 1004  Any interventions and vital signs: per protocol  Patient ambulated with assistance to bathroom. While climbing into bed, patient lost his balance and fell to the right. I was on his left and attempted to prevent the fall. Instead I ended up going down with him. Patient landed on his bottom. No injuries noted. MD walked in for rounds right after the fall. No concerns at this time. Patient back in bed and educated to sit on bed instead of climbing in bed.

## 2019-07-09 NOTE — Progress Notes (Signed)
  Speech Language Pathology Treatment: Dysphagia  Patient Details Name: Stephen Beard MRN: [id number] DOB: 1995/04/24 Today's Date: 07/09/2019 Time: 1016-1030 SLP Time Calculation (min) (ACUTE ONLY): 14 min  Assessment / Plan / Recommendation Clinical Impression  Pt presents with normal appearing swallow function as can be assessed clinically given self-fed trials of regular solids and thin liquids. Occasional coughing was noted while SLP was in the room, but it was not associated with PO intake. Pt says he coughs throughout the day - not necessarily at meals. Recommend continuing current diet without restrictions. No SLP f/u indicated for dysphagia, although see cognitive-linguistic evaluation for further recommendations.   HPI HPI: 24 y.o. admitted on 06/30/2019 with HA progressively worsening for months and blurry vision in L eye. CT and imaging revealed two large masses in the right parieto-occipital lobe with surrounding vasogenic edema and a 21 mm of right to left shift. Pt underwent R occipital craniotomy and resection of glioma on 4/20. Pt required return to OR for R craniotomy for hematoma evacuation 2/2 ICH. Pt extubated on 4/22.      SLP Plan  All goals met       Recommendations  Diet recommendations: Regular;Thin liquid Liquids provided via: Cup;Straw Medication Administration: Whole meds with liquid Supervision: Patient able to self feed;Intermittent supervision to cue for compensatory strategies Compensations: Slow rate;Small sips/bites Postural Changes and/or Swallow Maneuvers: Seated upright 90 degrees                Oral Care Recommendations: Oral care BID Follow up Recommendations: Inpatient Rehab(for cognition) SLP Visit Diagnosis: Dysphagia, unspecified (R13.10) Plan: All goals met       GO                 Osie Bond., M.A. [facility] Pager [phone] Office [phone]  07/09/2019, 12:07 PM

## 2019-07-09 NOTE — Evaluation (Signed)
Speech Language Pathology Evaluation Patient Details Name: Stephen Beard MRN: [id number] DOB: 01/18/1996 Today's Date: 07/09/2019 Time: 1030-1047 SLP Time Calculation (min) (ACUTE ONLY): 17 min  Problem List:  Patient Active Problem List   Diagnosis Date Noted  . Brain tumor ([place]) 07/01/2019  . Brain mass 06/30/2019   Past Medical History:  Past Medical History:  Diagnosis Date  . ADHD    Past Surgical History:  Past Surgical History:  Procedure Laterality Date  . APPLICATION OF CRANIAL NAVIGATION N/A 07/02/2019   Procedure: APPLICATION OF CRANIAL NAVIGATION;  Surgeon: Judith Part, MD;  Location: [facility] [place];  Service: Neurosurgery;  Laterality: N/A;  . CRANIOTOMY N/A 07/03/2019   Procedure: CRANIOTOMY HEMATOMA EVACUATION SUBDURAL;  Surgeon: Judith Part, MD;  Location: [facility] [facility] [place];  Service: Neurosurgery;  Laterality: N/A;  . CRANIOTOMY Right 07/03/2019   Procedure: CRANIOTOMY HEMATOMA EVACUATION SUBDURAL;  Surgeon: Judith Part, MD;  Location: [facility];  Service: Neurosurgery;  Laterality: Right;  . CRANIOTOMY Right 07/02/2019   Procedure: CRANIOTOMY TUMOR EXCISION with Lucky Rathke;  Surgeon: Judith Part, MD;  Location: [place];  Service: Neurosurgery;  Laterality: Right;  posterior   HPI:  24 y.o. admitted on 06/30/2019 with HA progressively worsening for months and blurry vision in L eye. CT and imaging revealed two large masses in the right parieto-occipital lobe with surrounding vasogenic edema and a 21 mm of right to left shift. Pt underwent R occipital craniotomy and resection of glioma on 4/20. Pt required return to OR for R craniotomy for hematoma evacuation 2/2 ICH. Pt extubated on 4/22.   Assessment / Plan / Recommendation Clinical Impression  Pt presents with mild cognitive deficits, primarily with higher level cognitive skills such as his selective attention, complex problem solving, and working memory. He also had mild difficulties with delayed recall task.  Overall he believes that he is not thinking as clearly as he typically would have been PTA. Recommend ongoing cognitive tx, including SLP f/u at next level of care.      SLP Assessment  SLP Recommendation/Assessment: Patient needs continued Speech Lanaguage Pathology Services SLP Visit Diagnosis: Cognitive communication deficit (R41.841)    Follow Up Recommendations  Inpatient Rehab    Frequency and Duration min 2x/week  2 weeks      SLP Evaluation Cognition  Overall Cognitive Status: Impaired/Different from baseline Arousal/Alertness: Awake/alert Orientation Level: Oriented X4 Attention: Selective Selective Attention: Impaired Selective Attention Impairment: Verbal complex Memory: Impaired Memory Impairment: Decreased recall of new information Awareness: Impaired Awareness Impairment: Anticipatory impairment Problem Solving: Impaired Problem Solving Impairment: Verbal complex Safety/Judgment: Impaired Comments: pt had a fall this morning       Comprehension  Auditory Comprehension Overall Auditory Comprehension: Appears within functional limits for tasks assessed    Expression Expression Primary Mode of Expression: Verbal Verbal Expression Overall Verbal Expression: Appears within functional limits for tasks assessed   Oral / Motor  Motor Speech Overall Motor Speech: Appears within functional limits for tasks assessed   GO                     Osie Bond., M.A. [facility] Pager [phone] Office [phone]  07/09/2019, 12:21 PM

## 2019-07-09 NOTE — Progress Notes (Signed)
Patient refused 0400 CBG. Previous 5 CBG readings: 115, 95, [id number]. Will continue to monitor.

## 2019-07-09 NOTE — Progress Notes (Signed)
Physical Therapy Treatment Patient Details Name: Stephen Beard MRN: [id number] DOB: Mar 30, 1995 Today's Date: 07/09/2019    History of Present Illness 24 y.o. admitted on 06/30/2019 with HA progressively worsening for months and blurry vision in L eye. 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 Pt underwent R occipital craniotomy and resection of glioma on 4/20. Pt required return to OR for R craniotomy for hematoma evacuation 2/2 ICH. Pt extubated on 4/22. PMH including ADHD.     PT Comments    Pt in good spirits initially and stated he fell earlier this morning and that he is not getting up without assist. Pt more aware of deficits today and makes effort to improve his safety. Pt with impaired vision, ataxic gait pattern, impaired balance, and impaired co-ordination. At end of session pt more sleepy and reports "I just feel exhausted". This was the first time pt amb outside the room. Pts BP 113/68, Spo2 91% on RA. RN present. Cont to recommend CIR upon d/c to maximize functional return. Acute PT to cont to follow.   Follow Up Recommendations  CIR;Supervision/Assistance - 24 hour     Equipment Recommendations  None recommended by PT    Recommendations for Other Services Rehab consult     Precautions / Restrictions Precautions Precautions: Fall;Other (comment) Precaution Comments: EVD removed Restrictions Weight Bearing Restrictions: No    Mobility  Bed Mobility Overal bed mobility: Needs Assistance Bed Mobility: Supine to Sit     Supine to sit: Min guard     General bed mobility comments: min guard for safety  Transfers Overall transfer level: Needs assistance Equipment used: None Transfers: Sit to/from Stand Sit to Stand: Min guard         General transfer comment: min guard for safety, pt requiring increased time reaching for assist to steady self  Ambulation/Gait Ambulation/Gait assistance: Min assist;+2 safety/equipment Gait Distance (Feet): 200  Feet(x1, 120x1) Assistive device: 1 person hand held assist Gait Pattern/deviations: Step-through pattern;Staggering right;Leaning posteriorly;Staggering left;Wide base of support Gait velocity: decreased Gait velocity interpretation: <1.8 ft/sec, indicate of risk for recurrent falls General Gait Details: pt with ataxic pattern however was able to focus on heel/toe gait pattern on shorter 120' bout of ambulation. Pt able to turn head L/R without significant LOB however pt with very blurred vision due to tumor and reports bilat LE "feel very heavy"   Stairs             Wheelchair Mobility    Modified Rankin (Stroke Patients Only) Modified Rankin (Stroke Patients Only) Pre-Morbid Rankin Score: No symptoms Modified Rankin: Moderately severe disability     Balance Overall balance assessment: Needs assistance Sitting-balance support: Feet supported;Bilateral upper extremity supported Sitting balance-Leahy Scale: Poor Sitting balance - Comments: at times min assist due to posterior LOB and sway.  Pt does well with tactile input to help him know where he is in space Postural control: Posterior lean Standing balance support: Bilateral upper extremity supported;Single extremity supported Standing balance-Leahy Scale: Poor Standing balance comment: min assist at sink washing hands for balance, wide BOS for stability.                             Cognition Arousal/Alertness: Awake/alert Behavior During Therapy: WFL for tasks assessed/performed Overall Cognitive Status: Impaired/Different from baseline  Problem Solving: Difficulty sequencing;Requires verbal cues;Requires tactile cues General Comments: pt had a fall this morning and since then has not tried to get up OOB today without assist and reports "I do not want to fall again"      Exercises      General Comments General comments (skin integrity, edema, etc.): pt assisted  to the bathroom, pt sits to urinate because "I can't see well so Id rather sit instead of stand and aim"      Pertinent Vitals/Pain Pain Assessment: 0-10 Pain Score: 4  Faces Pain Scale: No hurt Pain Location: headache Pain Descriptors / Indicators: Headache Pain Intervention(s): Premedicated before session    Home Living     Available Help at Discharge: Family;Available 24 hours/day(boyfriend) Type of Home: Apartment              Prior Function            PT Goals (current goals can now be found in the care plan section) Progress towards PT goals: Progressing toward goals    Frequency    Min 3X/week      PT Plan Current plan remains appropriate    Co-evaluation              AM-PAC PT "6 Clicks" Mobility   Outcome Measure  Help needed turning from your back to your side while in a flat bed without using bedrails?: A Little Help needed moving from lying on your back to sitting on the side of a flat bed without using bedrails?: A Little Help needed moving to and from a bed to a chair (including a wheelchair)?: A Little Help needed standing up from a chair using your arms (e.g., wheelchair or bedside chair)?: A Little Help needed to walk in hospital room?: A Little Help needed climbing 3-5 steps with a railing? : A Lot 6 Click Score: 17    End of Session   Activity Tolerance: Patient tolerated treatment well Patient left: in chair;with call bell/phone within reach;with chair alarm set;with family/visitor present Nurse Communication: Mobility status PT Visit Diagnosis: Unsteadiness on feet (R26.81);Other abnormalities of gait and mobility (R26.89);Other symptoms and signs involving the nervous system (R29.898)     Time: [id number] PT Time Calculation (min) (ACUTE ONLY): 40 min  Charges:  $Gait Training: 8-22 mins $Therapeutic Activity: 8-22 mins $Neuromuscular Re-education: 8-22 mins                     Kittie Plater, PT, DPT Acute  Rehabilitation Services Pager #: [phone] Office #: [phone]    [place]line Lopes 07/09/2019, 1:17 PM

## 2019-07-10 ENCOUNTER — Inpatient Hospital Stay: Payer: Medicaid Other | Attending: Internal Medicine

## 2019-07-10 LAB — GLUCOSE, CAPILLARY
Glucose-Capillary: 112 mg/dL — ABNORMAL HIGH (ref 70–99)
Glucose-Capillary: 123 mg/dL — ABNORMAL HIGH (ref 70–99)
Glucose-Capillary: 99 mg/dL (ref 70–99)

## 2019-07-10 LAB — SODIUM: Sodium: 137 mmol/L (ref 135–145)

## 2019-07-10 NOTE — Progress Notes (Signed)
Physical Therapy Treatment Patient Details Name: Stephen Beard MRN: [id number] DOB: 1996/01/01 Today's Date: 07/10/2019    History of Present Illness 24 y.o. admitted on 06/30/2019 with HA progressively worsening for months and blurry vision in L eye. 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 Pt underwent R occipital craniotomy and resection of glioma on 4/20. Pt required return to OR for R craniotomy for hematoma evacuation 2/2 ICH. Pt extubated on 4/22. PMH including ADHD.     PT Comments    Pt is progressing quickly with gait and balance recovery, slower with vision.  We were able to walk two laps around the unit with min guard assist.  We discussed safety with transitions after Stephen Beard fall yesterday attempting to crawl forward into the bed like her normally does.  We discussed original d/c options and now that he was doing better if he wanted to continue to pursue intensive therapy or if he wants to go home.  He reports home and does have 24/7 supervision/assist at discharge.  PT will continue to follow acutely for safe mobility progression.  Plan to practice stairs next session.   Follow Up Recommendations  Outpatient PT;Supervision for mobility/OOB     Equipment Recommendations  None recommended by PT    Recommendations for Other Services   NA     Precautions / Restrictions Precautions Precautions: Fall Precaution Comments: significant visual impairments and balance    Mobility  Bed Mobility               General bed mobility comments: Pt was OOB in the recliner chair, but reported he fell yesterday trying to crawl forward to get into the bed (and this was assisted).    Transfers Overall transfer level: Needs assistance Equipment used: None Transfers: Sit to/from Stand Sit to Stand: Min guard         General transfer comment: Min guard assist for safety  Ambulation/Gait Ambulation/Gait assistance: Min guard;Min assist Gait Distance (Feet): 300  Feet Assistive device: None Gait Pattern/deviations: Step-through pattern;Staggering right;Staggering left Gait velocity: decreased Gait velocity interpretation: 1.31 - 2.62 ft/sec, indicative of limited community ambulator General Gait Details: Pt with mildly staggering gait pattern, cues when getting close to obstacles (especially on Stephen Beard left, although therapist was on Stephen Beard right) to scan environment for what is coming in Stephen Beard path.  Educated on making sure he has a clear path at home, picking up throw rugs, stools or objects that are close to the floor or that are low contrast.     Stairs Stairs: Yes       General stair comments: Verbally discussed stair safety (use rail, one foot at at time, make sure foot is completely on step befoe proceding).  And would benefit from practice even though he has level entry and a one level home.          Balance Overall balance assessment: Needs assistance Sitting-balance support: Feet supported;No upper extremity supported Sitting balance-Leahy Scale: Fair     Standing balance support: No upper extremity supported Standing balance-Leahy Scale: Fair Standing balance comment: close supervision at sink while washing.                             Cognition Arousal/Alertness: Awake/alert Behavior During Therapy: WFL for tasks assessed/performed Overall Cognitive Status: Impaired/Different from baseline  Memory: Decreased short-term memory         General Comments: Seems to have difficulty with STM as he asked me a few of the same questions during the session.           General Comments General comments (skin integrity, edema, etc.): Provided with [employer] select[employer] information for FREE PT incase the cost of OP PT through neuro rehab is too high (he has applied for medicaid). O2 sats on RA today were in the 90s with no audible DOE.       Pertinent Vitals/Pain Pain Assessment: Faces Pain Score: 4   Pain Location: headache Pain Descriptors / Indicators: Headache Pain Intervention(s): Limited activity within patient's tolerance;Monitored during session;Repositioned           PT Goals (current goals can now be found in the care plan section) Acute Rehab PT Goals Patient Stated Goal: To return to independence, improve vision Progress towards PT goals: Progressing toward goals    Frequency    Min 3X/week      PT Plan Discharge plan needs to be updated    AM-PAC PT "6 Clicks" Mobility   Outcome Measure  Help needed turning from your back to your side while in a flat bed without using bedrails?: None Help needed moving from lying on your back to sitting on the side of a flat bed without using bedrails?: None Help needed moving to and from a bed to a chair (including a wheelchair)?: A Little Help needed standing up from a chair using your arms (e.g., wheelchair or bedside chair)?: A Little Help needed to walk in hospital room?: A Little Help needed climbing 3-5 steps with a railing? : A Little 6 Click Score: 20    End of Session Equipment Utilized During Treatment: Gait belt Activity Tolerance: Patient limited by pain(headache) Patient left: in chair;with call bell/phone within reach Nurse Communication: Mobility status PT Visit Diagnosis: Unsteadiness on feet (R26.81);Other abnormalities of gait and mobility (R26.89);Other symptoms and signs involving the nervous system [id number])     Time: [id number] PT Time Calculation (min) (ACUTE ONLY): 55 min  Charges:  $Gait Training: 23-37 mins $Therapeutic Activity: 8-22 mins $Self Care/Home Management: Connell, PT, DPT  Acute Rehabilitation [phone] pager #[phone] office     07/10/2019, 12:19 PM

## 2019-07-10 NOTE — Progress Notes (Signed)
Neurosurgery Service Progress Note  Subjective: NAE ON, drain d/c'd yesterday, no leakage from site overnight, headaches improved, no new complaints  Objective: Vitals:   07/10/19 0502 07/10/19 0600 07/10/19 0604 07/10/19 0700  BP: (!) 110/55 98/80  (!) 109/53  Pulse:  89 (!) 56 69  Resp:  18 17   Temp:      TempSrc:      SpO2:  (!) 78% 95% 94%  Weight:      Height:       Temp (24hrs), Avg:99.4 F (37.4 C), Min:98.7 F (37.1 C), Max:99.9 F (37.7 C)  CBC Latest Ref Rng & Units 07/09/2019 07/08/2019 07/07/2019  WBC 4.0 - 10.5 K/uL 18.4(H) 20.2(H) 14.1(H)  Hemoglobin 13.0 - 17.0 g/dL 10.9(L) 11.2(L) 10.0(L)  Hematocrit 39.0 - 52.0 % 32.2(L) 32.5(L) 29.4(L)  Platelets 150 - 400 K/uL 256 237 190   BMP Latest Ref Rng & Units 07/10/2019 07/09/2019 07/09/2019  Glucose 70 - 99 mg/dL - - -  BUN 6 - 20 mg/dL - - -  Creatinine 0.61 - 1.24 mg/dL - - -  Sodium 135 - 145 mmol/L 137 137 136  Potassium 3.5 - 5.1 mmol/L - - -  Chloride 98 - 111 mmol/L - - -  CO2 22 - 32 mmol/L - - -  Calcium 8.9 - 10.3 mg/dL - - -    Intake/Output Summary (Last 24 hours) at 07/10/2019 0744 Last data filed at 07/10/2019 0700 Gross per 24 hour  Intake 1407.89 ml  Output 1050 ml  Net 357.89 ml    Current Facility-Administered Medications:    0.9 %  sodium chloride infusion, , Intravenous, PRN, Shellia Cleverly, MD   acetaminophen (TYLENOL) tablet 650 mg, 650 mg, Oral, Q6H PRN, 650 mg at 07/08/19 1647 **OR** acetaminophen (TYLENOL) suppository 650 mg, 650 mg, Rectal, Q6H PRN, Judith Part, MD   Ampicillin-Sulbactam (UNASYN) 3 g in sodium chloride 0.9 % 100 mL IVPB, 3 g, Intravenous, Q6H, Jennelle Human B, NP, Stopped at 07/10/19 [id number]   bethanechol (URECHOLINE) tablet 10 mg, 10 mg, Oral, QID, Bergman, Meghan D, NP, 10 mg at 07/09/19 1749   Chlorhexidine Gluconate Cloth 2 % PADS 6 each, 6 each, Topical, Daily, , Joyice Faster, MD, 6 each at 07/08/19 1335   dexamethasone (DECADRON) tablet 2 mg, 2 mg,  Oral, Q12H, ,  A, MD, 2 mg at 07/09/19 2225   heparin injection 5,000 Units, 5,000 Units, Subcutaneous, Q8H, , Joyice Faster, MD, 5,000 Units at 07/10/19 0610   HYDROmorphone (DILAUDID) injection 0.5 mg, 0.5 mg, Intravenous, Q2H PRN, Judith Part, MD, 0.5 mg at 07/06/19 2038   HYDROmorphone (DILAUDID) injection 1 mg, 1 mg, Intravenous, Q2H PRN, Judith Part, MD, 1 mg at 07/07/19 1147   insulin aspart (novoLOG) injection 0-9 Units, 0-9 Units, Subcutaneous, Q4H, Simpson, Paula B, NP, 1 Units at 07/09/19 1219   ipratropium-albuterol (DUONEB) 0.5-2.5 (3) MG/3ML nebulizer solution 3 mL, 3 mL, Nebulization, Q4H PRN, Jennelle Human B, NP   melatonin tablet 6 mg, 6 mg, Oral, QHS, Vallarie Mare, MD, 6 mg at 07/09/19 2225   metoCLOPramide (REGLAN) injection 10 mg, 10 mg, Intravenous, Q6H, , Joyice Faster, MD, 10 mg at 07/10/19 [id number]   nicotine (NICODERM CQ - dosed in mg/24 hours) patch 21 mg, 21 mg, Transdermal, Daily, ,  A, MD, 21 mg at 07/09/19 1104   ondansetron (ZOFRAN) tablet 4 mg, 4 mg, Oral, Q6H PRN **OR** ondansetron (ZOFRAN) injection 4 mg, 4 mg, Intravenous, Q6H PRN, Emelda Brothers  A, MD, 4 mg at 07/04/19 2346   oxyCODONE (Oxy IR/ROXICODONE) immediate release tablet 10 mg, 10 mg, Oral, Q4H PRN, Judith Part, MD, 10 mg at 07/10/19 0449   oxyCODONE (OXYCONTIN) 12 hr tablet 15 mg, 15 mg, Oral, Q12H, Ashok Pall, MD, 15 mg at 07/09/19 2225   pantoprazole (PROTONIX) EC tablet 40 mg, 40 mg, Oral, Daily, Alvira Philips, RPH, 40 mg at 07/09/19 1102   polyethylene glycol (MIRALAX / GLYCOLAX) packet 17 g, 17 g, Oral, Daily, Jennelle Human B, NP, 17 g at 07/09/19 1102   senna-docusate (Senokot-S) tablet 1 tablet, 1 tablet, Oral, Daily, Jennelle Human B, NP, 1 tablet at 07/09/19 1103   sodium chloride flush (NS) 0.9 % injection 10-40 mL, 10-40 mL, Intracatheter, Q12H, Shellia Cleverly, MD, 10 mL at 07/09/19 2232   sodium chloride flush (NS)  0.9 % injection 10-40 mL, 10-40 mL, Intracatheter, PRN, Shellia Cleverly, MD   Physical Exam: Awake/alert, Ox3, PERRL, gaze neutral, +dense L field cut, FCx4 with full strength Incision c/d/i  Assessment & Plan: 24 y.o. 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 4/20 s/p craniotomy for resection, 4/21 post-op MRI with good resection, resection bed hematoma, midline shift decreased but still 70mm. 4/21 likely seizure, blown pupil, emergently taken to OR x2 for hematoma evacuation, post-op CTH w/ good evacuation, expected residual in deep cavity, 4/22 awake/FC on vent, b/l infiltrates on CXR likely 2/2 aspiration during seizure, 4/22 extubated, final path anaplastic astro  -d/c dex, scheduled reglan, d/c CBGs -transfer to stepdown today -Dr. Mickeal Skinner to discuss path with family today or tomorrow -SCDs/TEDs, Jackey Loge  07/10/19 7:44 AM

## 2019-07-10 NOTE — Progress Notes (Signed)
Inpatient Rehab Admissions Coordinator:   Note PT updated recommendations to outpatient and pt with good family support at home.  Will sign off for CIR at this time.   Shann Medal, PT, DPT Admissions Coordinator [phone] 07/10/19  1:07 PM

## 2019-07-11 DIAGNOSIS — C718 Malignant neoplasm of overlapping sites of brain: Secondary | ICD-10-CM

## 2019-07-11 NOTE — Progress Notes (Signed)
Occupational Therapy Treatment Patient Details Name: Stephen Beard MRN: [id number] DOB: 1995/10/14 Today's Date: 07/11/2019    History of present illness 24 y.o. admitted on 06/30/2019 with HA progressively worsening for months and blurry vision in L eye. 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 Pt underwent R occipital craniotomy and resection of glioma on 4/20. Pt required return to OR for R craniotomy for hematoma evacuation 2/2 ICH. Pt extubated on 4/22. PMH including ADHD.    OT comments  Pt progressing towards established OT goals. Providing handout and education compensatory techniques for low vision. Pt verbalized understanding. Challenging pt's cognition, balance, and visual scanning (compensatory techniques) during mobility into bathroom for toileting and then in hallway. Pt requiring Mod cues for problem solving, safety, and attention. Pt very motivated to participate in therapy. Update dc to OP OT and will continue to follow acutely as admitted   Follow Up Recommendations  Outpatient OT;Supervision - Intermittent    Equipment Recommendations  Tub/shower seat    Recommendations for Other Services PT consult;Speech consult    Precautions / Restrictions Precautions Precautions: Fall Precaution Comments: significant visual impairments and balance       Mobility Bed Mobility Overal bed mobility: Needs Assistance Bed Mobility: Supine to Sit     Supine to sit: Supervision     General bed mobility comments: supervision for safety  Transfers Overall transfer level: Needs assistance Equipment used: None Transfers: Sit to/from Stand Sit to Stand: Supervision         General transfer comment: supervision for safety    Balance Overall balance assessment: Needs assistance Sitting-balance support: Feet supported;No upper extremity supported Sitting balance-Leahy Scale: Good Sitting balance - Comments: able to adjust sock in sitting without difficulty  leaning forward   Standing balance support: No upper extremity supported;Single extremity supported Standing balance-Leahy Scale: Good Standing balance comment: close supervision for static standing.                            ADL either performed or assessed with clinical judgement   ADL Overall ADL's : Needs assistance/impaired Eating/Feeding: Set up;Supervision/ safety;Sitting   Grooming: Wash/dry hands;Supervision/safety;Standing                   Toilet Transfer: Ambulation;Regular Toilet;Grab bars;Supervision/safety   Toileting- Clothing Manipulation and Hygiene: Sit to/from stand;Supervision/safety       Functional mobility during ADLs: Min guard General ADL Comments: Pt performing toileting and hand hygiene with Supervision. Providing handout and education on compensatory tehcniques for low vision; pt verbalized understanding. Having pt perform mobility in hallway and challnging balance, visual scanning, attention, and problem solving     Vision   Additional Comments: Continues to report blurry vision   Perception     Praxis      Cognition Arousal/Alertness: Awake/alert Behavior During Therapy: WFL for tasks assessed/performed Overall Cognitive Status: Impaired/Different from baseline Area of Impairment: Memory;Problem solving;Awareness;Safety/judgement;Attention                   Current Attention Level: Selective Memory: Decreased short-term memory Following Commands: Follows one step commands consistently;Follows multi-step commands with increased time Safety/Judgement: Decreased awareness of safety Awareness: Emergent Problem Solving: Requires verbal cues General Comments: Focused session on increasing awareness with use of visual scanning and to navigate through different fall hazards. Mod cues provided. Additionally having pt navigate back to his room. Pt with decreased attention and ability to alterante between coversation and  locating room. Decreased ST memory        Exercises     Shoulder Instructions       General Comments VSS    Pertinent Vitals/ Pain       Pain Assessment: Faces Faces Pain Scale: No hurt Pain Intervention(s): Monitored during session  Home Living                                          Prior Functioning/Environment              Frequency  Min 3X/week        Progress Toward Goals  OT Goals(current goals can now be found in the care plan section)  Progress towards OT goals: Progressing toward goals  Acute Rehab OT Goals Patient Stated Goal: To return to independence, improve vision OT Goal Formulation: With patient Time For Goal Achievement: 07/19/19 Potential to Achieve Goals: Good ADL Goals Pt Will Perform Grooming: with set-up;with supervision;sitting Pt Will Perform Upper Body Dressing: with set-up;with supervision;sitting Pt Will Transfer to Toilet: with min assist;ambulating;bedside commode Pt Will Perform Toileting - Clothing Manipulation and hygiene: sitting/lateral leans;with min guard assist;sit to/from stand Additional ADL Goal #1: Pt will demonstrate selective attention during ADLs with 2-3 cues Additional ADL Goal #2: Pt will recall and perform three ADLs with Min cues  Plan Discharge plan remains appropriate    Co-evaluation                 AM-PAC OT "6 Clicks" Daily Activity     Outcome Measure   Help from another person eating meals?: A Little Help from another person taking care of personal grooming?: A Little Help from another person toileting, which includes using toliet, bedpan, or urinal?: A Little Help from another person bathing (including washing, rinsing, drying)?: A Lot Help from another person to put on and taking off regular upper body clothing?: A Little Help from another person to put on and taking off regular lower body clothing?: A Little 6 Click Score: 17    End of Session    OT Visit  Diagnosis: Unsteadiness on feet (R26.81);Other abnormalities of gait and mobility (R26.89);Muscle weakness (generalized) (M62.81);Pain   Activity Tolerance Patient tolerated treatment well   Patient Left in chair;with call bell/phone within reach;with chair alarm set   Nurse Communication Mobility status        Time: [id number] OT Time Calculation (min): 27 min  Charges: OT General Charges $OT Visit: 1 Visit OT Treatments $Self Care/Home Management : 23-37 mins  [place], OTR/L Acute Rehab Pager: [phone] Office: [place] 07/11/2019, 5:42 PM

## 2019-07-11 NOTE — Progress Notes (Signed)
Neurosurgery Service Progress Note  Subjective: NAE ON, headaches stable, did mention some paranoia today regarding intracranial chip / implant, [employer] investigations, still hearing voices  Objective: Vitals:   07/11/19 0320 07/11/19 0400 07/11/19 0434 07/11/19 0800  BP:  102/65 98/66 115/65  Pulse: 85 63 76 (!) 113  Resp: 13   (!) 22  Temp: 98.2 F (36.8 C)  98.4 F (36.9 C) 98 F (36.7 C)  TempSrc: Oral  Oral Oral  SpO2: 94% 95% 94% 98%  Weight:      Height:       Temp (24hrs), Avg:98.5 F (36.9 C), Min:98 F (36.7 C), Max:99.7 F (37.6 C)  CBC Latest Ref Rng & Units 07/09/2019 07/08/2019 07/07/2019  WBC 4.0 - 10.5 K/uL 18.4(H) 20.2(H) 14.1(H)  Hemoglobin 13.0 - 17.0 g/dL 10.9(L) 11.2(L) 10.0(L)  Hematocrit 39.0 - 52.0 % 32.2(L) 32.5(L) 29.4(L)  Platelets 150 - 400 K/uL 256 237 190   BMP Latest Ref Rng & Units 07/10/2019 07/09/2019 07/09/2019  Glucose 70 - 99 mg/dL - - -  BUN 6 - 20 mg/dL - - -  Creatinine 0.61 - 1.24 mg/dL - - -  Sodium 135 - 145 mmol/L 137 137 136  Potassium 3.5 - 5.1 mmol/L - - -  Chloride 98 - 111 mmol/L - - -  CO2 22 - 32 mmol/L - - -  Calcium 8.9 - 10.3 mg/dL - - -    Intake/Output Summary (Last 24 hours) at 07/11/2019 0911 Last data filed at 07/11/2019 0802 Gross per 24 hour  Intake 954.11 ml  Output 1850 ml  Net -895.89 ml    Current Facility-Administered Medications:  .  0.9 %  sodium chloride infusion, , Intravenous, PRN, Shellia Cleverly, MD .  acetaminophen (TYLENOL) tablet 650 mg, 650 mg, Oral, Q6H PRN, 650 mg at 07/11/19 0317 **OR** acetaminophen (TYLENOL) suppository 650 mg, 650 mg, Rectal, Q6H PRN, Judith Part, MD .  bethanechol (URECHOLINE) tablet 10 mg, 10 mg, Oral, QID, Bergman, Meghan D, NP, 10 mg at 07/09/19 1749 .  Chlorhexidine Gluconate Cloth 2 % PADS 6 each, 6 each, Topical, Daily, Teodoro Jeffreys, Joyice Faster, MD, 6 each at 07/10/19 1016 .  heparin injection 5,000 Units, 5,000 Units, Subcutaneous, Q8H, Brittinie Wherley, Joyice Faster, MD,  5,000 Units at 07/11/19 0507 .  HYDROmorphone (DILAUDID) injection 0.5 mg, 0.5 mg, Intravenous, Q2H PRN, Judith Part, MD, 0.5 mg at 07/06/19 2038 .  HYDROmorphone (DILAUDID) injection 1 mg, 1 mg, Intravenous, Q2H PRN, Judith Part, MD, 1 mg at 07/07/19 1147 .  ipratropium-albuterol (DUONEB) 0.5-2.5 (3) MG/3ML nebulizer solution 3 mL, 3 mL, Nebulization, Q4H PRN, Jennelle Human B, NP .  melatonin tablet 6 mg, 6 mg, Oral, QHS, Vallarie Mare, MD, 6 mg at 07/10/19 2205 .  nicotine (NICODERM CQ - dosed in mg/24 hours) patch 21 mg, 21 mg, Transdermal, Daily, Maeson Purohit, Joyice Faster, MD, 21 mg at 07/10/19 1015 .  ondansetron (ZOFRAN) tablet 4 mg, 4 mg, Oral, Q6H PRN **OR** ondansetron (ZOFRAN) injection 4 mg, 4 mg, Intravenous, Q6H PRN, Judith Part, MD, 4 mg at 07/04/19 2346 .  oxyCODONE (Oxy IR/ROXICODONE) immediate release tablet 10 mg, 10 mg, Oral, Q4H PRN, Judith Part, MD, 10 mg at 07/11/19 0051 .  oxyCODONE (OXYCONTIN) 12 hr tablet 15 mg, 15 mg, Oral, Q12H, Ashok Pall, MD, 15 mg at 07/10/19 2205 .  pantoprazole (PROTONIX) EC tablet 40 mg, 40 mg, Oral, Daily, Alvira Philips, [place], 40 mg at 07/10/19 1015 .  polyethylene  glycol (MIRALAX / GLYCOLAX) packet 17 g, 17 g, Oral, Daily, Jennelle Human B, NP, 17 g at 07/10/19 1015 .  senna-docusate (Senokot-S) tablet 1 tablet, 1 tablet, Oral, Daily, Jennelle Human B, NP, 1 tablet at 07/10/19 1015   Physical Exam: Awake/alert, Ox3, PERRL, gaze neutral, +dense L field cut, FCx4 with full strength Incision c/d/i  Assessment & Plan: 24 y.o. 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 4/20 s/p craniotomy for resection, 4/21 post-op MRI with good resection, resection bed hematoma, midline shift decreased but still 80mm. 4/21 likely seizure, blown  pupil, emergently taken to OR x2 for hematoma evacuation, post-op CTH w/ good evacuation, expected residual in deep cavity, 4/22 awake/FC on vent, b/l infiltrates on CXR likely 2/2 aspiration during seizure, 4/22 extubated, final path anaplastic astro  -Dr. Mickeal Skinner to evaluate today, will get his thoughts regarding further workup of the paranoia, dex has been off for 24h -PT/OT now rec'ing outpt PT -SCDs/TEDs, SQ[facility]  Judith Part  07/11/19 9:11 AM

## 2019-07-11 NOTE — Consult Note (Signed)
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 male presented to medical attention with several months of progressive headaches and left sided visual impairment.  This was accompanied by episodes of deja-vu +paroxysmal burning smells which would occur almost daily over that time.  CNS imaging demonstrated large parieto-occipital mass with herniation syndrome.  Mass was resected/debulked, and post-operative course was complicated by intracranial hematoma and also respiratory failure.  At present, he is walking and talking, carries left sided visual impairment.  He does acknowledge hearing voices and feeling paranoid, "I think I had a chip placed in my head for tracking".  There is no intent to harm himself or others, he has good insight into his current medical situation.    MEDICAL HISTORY:  Past Medical History:  Diagnosis Date  . ADHD     SURGICAL HISTORY: Past Surgical History:  Procedure Laterality Date  . APPLICATION OF CRANIAL NAVIGATION N/A 07/02/2019   Procedure: APPLICATION OF CRANIAL NAVIGATION;  Surgeon: Judith Part, MD;  Location: [place];  Service: Neurosurgery;  Laterality: N/A;  . CRANIOTOMY N/A 07/03/2019   Procedure: CRANIOTOMY HEMATOMA EVACUATION SUBDURAL;  Surgeon: Judith Part, MD;  Location: [place];  Service: Neurosurgery;  Laterality: N/A;  . CRANIOTOMY Right 07/03/2019   Procedure: CRANIOTOMY HEMATOMA EVACUATION SUBDURAL;  Surgeon: Judith Part, MD;  Location: [place];  Service: Neurosurgery;  Laterality: Right;  . CRANIOTOMY Right 07/02/2019   Procedure: CRANIOTOMY TUMOR EXCISION with Lucky Rathke;  Surgeon: Judith Part, MD;  Location: [place];  Service: Neurosurgery;  Laterality: Right;  posterior    SOCIAL  HISTORY: Social History   Socioeconomic History  . Marital status: Single    Spouse name: Not on file  . Number of children: Not on file  . Years of education: Not on file  . Highest education level: Not on file  Occupational History  . Not on file  Tobacco Use  . Smoking status: Current Every Day Smoker  Substance and Sexual Activity  . Alcohol use: Yes  . Drug use: No  . Sexual activity: Not on file  Other Topics Concern  . Not on file  Social History Narrative  . Not on file   Social Determinants of Health   Financial Resource Strain:   . Difficulty of Paying Living Expenses:   Food Insecurity:   . Worried About Charity fundraiser in the Last Year:   . Arboriculturist in the Last Year:   Transportation Needs:   . Film/video editor (Medical):   Marland Kitchen Lack of Transportation (Non-Medical):   Physical Activity:   . Days of Exercise per Week:   . Minutes of Exercise per Session:   Stress:   . Feeling of Stress :   Social Connections:   . Frequency of Communication with Friends and Family:   . Frequency of Social Gatherings with Friends and Family:   . Attends Religious Services:   . Active Member of Clubs or Organizations:   . Attends Archivist Meetings:   Marland Kitchen Marital Status:   Intimate Partner Violence:   . Fear of Current or Ex-Partner:   . Emotionally Abused:   Marland Kitchen Physically Abused:   . Sexually Abused:     FAMILY HISTORY: History reviewed. No pertinent family history.  ALLERGIES:  has No Known Allergies.  MEDICATIONS:  Current Facility-Administered Medications  Medication Dose Route Frequency Provider Last Rate Last Admin  . 0.9 %  sodium chloride infusion   Intravenous PRN Shellia Cleverly, MD      . acetaminophen (TYLENOL) tablet 650 mg  650 mg Oral Q6H PRN Judith Part, MD   650 mg at 07/11/19 [id number]   Or  . acetaminophen (TYLENOL) suppository 650 mg  650 mg Rectal Q6H PRN Judith Part, MD      . bethanechol (URECHOLINE) tablet  10 mg  10 mg Oral QID Viona Gilmore D, NP   10 mg at 07/11/19 1354  . Chlorhexidine Gluconate Cloth 2 % PADS 6 each  6 each Topical Daily Judith Part, MD   6 each at 07/11/19 1000  . heparin injection 5,000 Units  5,000 Units Subcutaneous Q8H Judith Part, MD   5,000 Units at 07/11/19 1354  . HYDROmorphone (DILAUDID) injection 0.5 mg  0.5 mg Intravenous Q2H PRN Judith Part, MD   0.5 mg at 07/06/19 2038  . HYDROmorphone (DILAUDID) injection 1 mg  1 mg Intravenous Q2H PRN Judith Part, MD   1 mg at 07/07/19 1147  . ipratropium-albuterol (DUONEB) 0.5-2.5 (3) MG/3ML nebulizer solution 3 mL  3 mL Nebulization Q4H PRN Jennelle Human B, NP      . melatonin tablet 6 mg  6 mg Oral QHS Vallarie Mare, MD   6 mg at 07/10/19 2205  . nicotine (NICODERM CQ - dosed in mg/24 hours) patch 21 mg  21 mg Transdermal Daily Judith Part, MD   21 mg at 07/11/19 1000  . ondansetron (ZOFRAN) tablet 4 mg  4 mg Oral Q6H PRN Judith Part, MD       Or  . ondansetron (ZOFRAN) injection 4 mg  4 mg Intravenous Q6H PRN Judith Part, MD   4 mg at 07/04/19 2346  . oxyCODONE (Oxy IR/ROXICODONE) immediate release tablet 10 mg  10 mg Oral Q4H PRN Judith Part, MD   10 mg at 07/11/19 0051  . oxyCODONE (OXYCONTIN) 12 hr tablet 15 mg  15 mg Oral Q12H Ashok Pall, MD   15 mg at 07/11/19 1034  . pantoprazole (PROTONIX) EC tablet 40 mg  40 mg Oral Daily Alvira Philips, [place]   40 mg at 07/11/19 1034  . polyethylene glycol (MIRALAX / GLYCOLAX) packet 17 g  17 g Oral Daily Jennelle Human B, NP   17 g at 07/11/19 1034  . senna-docusate (Senokot-S) tablet 1 tablet  1 tablet Oral Daily Jennelle Human B, NP   1 tablet at 07/11/19 1034    REVIEW OF SYSTEMS:   Constitutional: Denies fevers, chills or abnormal weight loss Eyes: Denies blurriness of vision Ears, nose, mouth, throat, and face: Denies mucositis or sore throat Respiratory: Denies cough, dyspnea or  wheezes Cardiovascular: Denies palpitation, chest discomfort or lower extremity swelling Gastrointestinal:  Denies nausea, constipation, diarrhea GU: Denies dysuria or incontinence Skin: Denies abnormal skin rashes Neurological: Per HPI Musculoskeletal: Denies joint pain, back or neck discomfort. No decrease in ROM Behavioral/Psych: Denies anxiety, disturbance in thought content, and mood instability   PHYSICAL EXAMINATION: Vitals:   07/11/19 1111 07/11/19 1636  BP: 112/72 126/70  Pulse: (!) 108 (!) 102  Resp: (!) 24 13  Temp: 97.6 F (36.4 C) 99.7 F (37.6 C)  SpO2: 94% 94%   KPS: 80. General: Alert, cooperative, pleasant, in no acute distress Head: Craniotomy scar  noted, dry and intact. EENT: No conjunctival injection or scleral icterus. Oral mucosa moist Lungs: Resp effort normal Cardiac: Regular rate and rhythm Abdomen: Soft, non-distended abdomen Skin: No rashes cyanosis or petechiae. Extremities: No clubbing or edema  NEUROLOGIC EXAM: Mental Status: Awake, alert, attentive to examiner. Oriented to self and environment. Language is fluent with intact comprehension.  Episodes of tangential paranoid thought patterns. Cranial Nerves: Visual acuity is grossly normal. Left hemianopia. Extra-ocular movements intact. No ptosis. Face is symmetric, tongue midline. Motor: Tone and bulk are normal. Power is full in both arms and legs. Reflexes are symmetric, no pathologic reflexes present. Intact finger to nose bilaterally Sensory: Intact to light touch and temperature Gait: Normal and tandem gait is normal.   LABORATORY DATA:  I have reviewed the data as listed Lab Results  Component Value Date   WBC 18.4 (H) 07/09/2019   HGB 10.9 (L) 07/09/2019   HCT 32.2 (L) 07/09/2019   MCV 92.5 07/09/2019   PLT 256 07/09/2019   Recent Labs    02/08/19 1204 06/30/19 2028 07/04/19 0458 07/04/19 0806 07/05/19 0422 07/05/19 0811 07/08/19 0252 07/08/19 0847 07/09/19 0341  07/09/19 0808 07/09/19 1409 07/09/19 2034 07/10/19 0249  NA 139   < > 140   < > 145   < > 135   < > 134*   < > 136 137 137  K 3.9   < > 3.7   < > 3.8  --  4.2  --  4.2  --   --   --   --   CL 104   < > 111   < > 112*  --  99  --  97*  --   --   --   --   CO2 25   < > 22   < > 23  --  26  --  28  --   --   --   --   GLUCOSE 106*   < > 120*   < > 133*  --  144*  --  128*  --   --   --   --   BUN 15   < > 6   < > 14  --  19  --  23*  --   --   --   --   CREATININE 0.98   < > 0.66   < > 0.73  --  0.72  --  0.73  --   --   --   --   CALCIUM 9.9   < > 7.7*   < > 8.5*  --  8.3*  --  8.6*  --   --   --   --   GFRNONAA >60   < > >60   < > >60  --  >60  --  >60  --   --   --   --   GFRAA >60   < > >60   < > >60  --  >60  --  >60  --   --   --   --   PROT 7.2  --   --   --  5.5*  --   --   --   --   --   --   --   --   ALBUMIN 4.5   < > 2.9*  --  3.0*  --   --   --  2.8*  --   --   --   --  AST 23  --   --   --  33  --   --   --   --   --   --   --   --   ALT 18  --   --   --  18  --   --   --   --   --   --   --   --   ALKPHOS 65  --   --   --  28*  --   --   --   --   --   --   --   --   BILITOT 0.9  --   --   --  0.6  --   --   --   --   --   --   --   --    < > = values in this interval not displayed.    RADIOGRAPHIC STUDIES: I have personally reviewed the radiological images as listed and agreed with the findings in the report. CT HEAD WO CONTRAST  Result Date: 07/03/2019 CLINICAL DATA:  Postop hematoma evacuation. EXAM: CT HEAD WITHOUT CONTRAST TECHNIQUE: Contiguous axial images were obtained from the base of the skull through the vertex without intravenous contrast. COMPARISON:  07/03/2019 at 5:37 p.m. FINDINGS: Brain: Sequelae of right parieto-occipital craniotomy for tumor resection are again identified. A hematoma in the medial right parietal operative bed is smaller following interval evacuation, now measuring 4.2 x 3.0 x 2.8 cm (AP x transverse x craniocaudal). Gas is noted along the  operative tract in the medial right parieto-occipital region, and there is a new drain in place in this location. There is persistent extensive vasogenic edema in the posterior right cerebral hemisphere. Mass effect from the hematoma has decreased with leftward midline shift now measuring 12 mm (previously 17 mm). The lateral ventricles are slightly smaller than on the prior study. There is a small amount of extra-axial blood along the falx in the right parietal region. There is also minimal extra-axial blood over the posterior right cerebral convexity underneath the craniotomy. There is persistent partial effacement of the basilar cisterns. There is no cerebellar tonsillar herniation. No sizable acute cortically based infarct is identified. Vascular: No hyperdense vessel. Skull: Right parieto-occipital craniotomy with small volume fluid and gas in the scalp. Sinuses/Orbits: Unremarkable orbits. Minimal mucosal thickening in the paranasal sinuses. Clear mastoid air cells. Other: None. IMPRESSION: Decreased size of right parietal hematoma status post evacuation and drain placement. Persistent extensive vasogenic edema in the posterior right cerebral hemisphere with decreased leftward midline shift, now 12 mm. Electronically Signed   By: Logan Bores M.D.   On: 07/03/2019 20:35   CT HEAD WO CONTRAST  Result Date: 07/03/2019 CLINICAL DATA:  Seizure. Abnormal neurological examination following surgery. Status post tumor resection yesterday and subsequent postoperative hematoma evacuation today. EXAM: CT HEAD WITHOUT CONTRAST TECHNIQUE: Contiguous axial images were obtained from the base of the skull through the vertex without intravenous contrast. COMPARISON:  Head MRI 07/03/2019 FINDINGS: Brain: Postoperative changes are again seen from right parieto-occipital craniotomy. A hyperattenuating hematoma in the medial right parietal operative bed measures 5.3 x 3.6 x 4.3 cm (AP x transverse x craniocaudal), mildly  smaller than on today's MRI. Extensive edema in the posterior right cerebrum is similar to the prior MRI, as is leftward midline shift which measures 17 mm. Postoperative gas is noted along the operative tract. The ventricles are unchanged in size with mild asymmetric dilatation of  the right temporal horn again noted. No sizable extra-axial fluid collection is present. There remains partial effacement of the basilar cisterns including the suprasellar cistern. There is no cerebellar tonsillar herniation. Vascular: No hyperdense vessel. Skull: Right parieto-occipital craniotomy with small volume fluid and gas in the scalp soft tissues and with skin staples in place. Sinuses/Orbits: Unremarkable orbits. Minimal mucosal thickening in the paranasal sinuses. Clear mastoid air cells. Other: None. IMPRESSION: Postoperative changes with residual hematoma in the right parietal operative bed, mildly smaller than on today's earlier MRI. Similar degree of edema and mass effect with 17 mm of midline shift. Electronically Signed   By: Logan Bores M.D.   On: 07/03/2019 19:12   CT HEAD WO CONTRAST  Result Date: 07/01/2019 CLINICAL DATA:  Benign neoplasm, brain/CNS volumetric scan for intraop navigation. EXAM: CT HEAD WITHOUT CONTRAST TECHNIQUE: Contiguous axial images were obtained from the base of the skull through the vertex without intravenous contrast. COMPARISON:  Brain MRI 06/30/2019, head CT 06/30/2019 FINDINGS: Brain: Examination performed for the purposes of intraoperative navigation. Again demonstrated are multiple parenchymal masses centered in the right parietooccipital region. As before, the largest component measures 4.1 cm. Redemonstrated calcification associated with some of these masses. Prominent associated edema within the posterior right cerebral hemisphere and extending into the corpus callosum. Significant partial effacement of the right lateral and third ventricles. Unchanged 10 mm leftward midline shift  measured at the level of the septum pellucidum. No interval intracranial abnormality. No extra-axial fluid collection. Vascular: No hyperdense vessel. Skull: Normal. Negative for fracture or focal lesion. Sinuses/Orbits: Visualized orbits demonstrate no acute abnormality. No significant paranasal sinus disease or mastoid effusion at the imaged levels. IMPRESSION: CT imaging performed for the purposes of intraoperative navigation. Redemonstrated are multiple parenchymal masses centered within the right parietooccipital region, some of which have associated calcification. Prominent edema within the posterior right cerebral hemisphere and extending into the corpus callosum. Please refer to recent MRI head 06/30/2019 for favored differential considerations. Mass effect with significant partial effacement of the right lateral and third ventricles. Unchanged 10 mm leftward midline shift. Electronically Signed   By: Kellie Simmering DO   On: 07/01/2019 11:28   CT Head Wo Contrast  Result Date: 06/30/2019 CLINICAL DATA:  Progressive headache, blurred vision on the left. EXAM: CT HEAD WITHOUT CONTRAST TECHNIQUE: Contiguous axial images were obtained from the base of the skull through the vertex without intravenous contrast. COMPARISON:  None. FINDINGS: Brain: Large mass lesions noted in the right occipital lobe and right medial parietal lobe. At least 2 mass lesions appear to be present measuring up to 4.2 cm and 4.1 cm. There is extensive surrounding vasogenic edema. Mass effect on the lateral ventricles. 21 mm of right to left midline shift. No hydrocephalus. Vascular: No hyperdense vessel or unexpected calcification. Skull: No acute calvarial abnormality. Sinuses/Orbits: Visualized paranasal sinuses and mastoids clear. Orbital soft tissues unremarkable. Other: None IMPRESSION: Large solid mass lesions which are partially calcified noted in the right parietal and occipital lobes with 21 mm of right to left midline shift.  Recommend further evaluation with MRI. Electronically Signed   By: Rolm Baptise M.D.   On: 06/30/2019 20:09   MR BRAIN W WO CONTRAST  Result Date: 07/03/2019 CLINICAL DATA:  Follow-up craniotomy. 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 EXAM: MRI HEAD WITHOUT AND WITH CONTRAST TECHNIQUE: Multiplanar, multiecho pulse sequences of the brain and surrounding structures were obtained without and with intravenous contrast. CONTRAST:  61mL GADAVIST GADOBUTROL 1 MMOL/ML IV SOLN COMPARISON:  CT and MRI studies of 06/30/2019 and 07/01/2019. FINDINGS: Brain: Interval right parieto-occipital craniotomy for debulking of the large malignant-appearing mass with the epicenter in the deep right medial parietal lobe. The vast majority of the enhancing tumor has been resected. Residual material in the postoperative space could be a combination of residual nonenhancing tumor and packing material. There does appear to be some indistinct contrast enhancement particularly towards the inferior margin suggesting that some of this represents residual tumor. Surrounding vasogenic white matter edema appears quite similar. Sub falcine herniation appears similar, though right-to-left midline shift is decreased from 17 mm 15 or 16 mm. Slight increase in size of the occipital horn and temporal horn of the right lateral ventricle could be due 2 diminished mass effect from the tumor debulking as well as some potential for localized trapping. No unexpected subdural collection or hemorrhagic complication. Vascular: Major vessels at the base of the brain show flow. Skull and upper cervical spine: Craniotomy changes as above. Sinuses/Orbits: Clear sinuses. Flattening of the posterior globes possibly related to chronic increased intracranial pressure Other: None IMPRESSION: Recent right parieto-occipital craniotomy for tumor debulking. The vast majority of the enhancing tumor has been resected. Residual material in the operative space could be a  combination of poorly enhancing tumor, blood products and packing material. Vasogenic edema appears similar. Mass effect with sub falcine herniation appears similar. Slight increased prominence of the right occipital horn and temporal horn could be due to diminished mass-effect upon those structures or could possibly represent an element of trapping. The changes minor. Electronically Signed   By: Nelson Chimes M.D.   On: 07/03/2019 13:03   MR Brain W and Wo Contrast  Result Date: 07/01/2019 CLINICAL DATA:  Worsening headache for 2 months.  Encephalopathy. EXAM: MRI HEAD WITHOUT AND WITH CONTRAST TECHNIQUE: Multiplanar, multiecho pulse sequences of the brain and surrounding structures were obtained without and with intravenous contrast. CONTRAST:  53mL GADAVIST GADOBUTROL 1 MMOL/ML IV SOLN COMPARISON:  Head CT 06/30/2019 FINDINGS: BRAIN: There is a large, heterogeneous mass of the posterior right hemisphere, centered in the right occipital lobe. There is marked mass effect on the right lateral ventricle. Leftward midline shift measures 10 mm. Other enhancing components measure up to 3.1 cm. No contralateral or infratentorial lesion. There is multifocal calcification within the mass. VASCULAR: Major flow voids are preserved. Susceptibility-sensitive sequences show no chronic microhemorrhage or superficial siderosis. SKULL AND UPPER CERVICAL SPINE: Normal calvarium and skull base. Visualized upper cervical spine and soft tissues are normal. SINUSES/ORBITS: No paranasal sinus fluid levels or advanced mucosal thickening. No mastoid or middle ear effusion. Normal orbits. IMPRESSION: Clustered enhancing masses of the right occipital lobe causing 10 mm of leftward midline shift with mass effect on the lateral ventricles. Differential considerations include oligodendroglioma, glioblastoma multiforme, extraventricular neurocytoma and metastatic disease. Electronically Signed   By: Ulyses Jarred M.D.   On: 07/01/2019 00:16    DG Chest Port 1 View  Result Date: 07/07/2019 CLINICAL DATA:  Acute respiratory failure EXAM: PORTABLE CHEST 1 VIEW COMPARISON:  July 04, 2019 FINDINGS: The right basilar infiltrate on the previous study has significantly improved. Peripheral opacities remain in the right lung with worsening in the upper lung. The opacity in the left lung is now in a somewhat peripheral location as well. The overall amount of infiltrate in the left lung is similar although the distribution is changed. Stable left central line. No pneumothorax. Cardiomediastinal silhouette is stable. IMPRESSION: 1. Persistent pulmonary infiltrates. The focal infiltrate in the  right base has significantly improved. There is increasing peripheral opacity in the right upper lung. The overall amount of infiltrate on the left is similar although the distribution is changed. The findings are most consistent with an infectious process. Asymmetric edema considered less likely. Electronically Signed   By: Dorise Bullion III M.D   On: 07/07/2019 11:57   DG CHEST PORT 1 VIEW  Result Date: 07/04/2019 CLINICAL DATA:  Acute respiratory failure with hypoxia. EXAM: PORTABLE CHEST 1 VIEW COMPARISON:  Radiograph yesterday. FINDINGS: Left subclavian central line tip in the mid SVC. Interval extubation and removal of enteric tube. Patchy and heterogeneous bilateral lung opacities in a mid lower lung zone predominant distribution, with minimal improved aeration of the right lung base from yesterday otherwise unchanged. Unchanged heart size and mediastinal contours. No pneumothorax. IMPRESSION: 1. Interval extubation and removal of enteric tube. 2. Patchy and heterogeneous bilateral lung opacities with minimal improved aeration of the right lung base from yesterday. Electronically Signed   By: Keith Rake M.D.   On: 07/04/2019 20:05   DG CHEST PORT 1 VIEW  Result Date: 07/03/2019 CLINICAL DATA:  Intubated, central line placement EXAM: PORTABLE CHEST 1  VIEW COMPARISON:  None. FINDINGS: Two frontal views of the chest demonstrate endotracheal tube overlying tracheal air column tip at level of thoracic inlet. Enteric catheter passes below diaphragm tip projects over gastric body. Left subclavian catheter tip projects over superior vena cava. Cardiac silhouette is unremarkable. There is bilateral airspace disease, with greatest consolidation in the right middle and right lower lobes. No large effusion or pneumothorax. IMPRESSION: 1. Support devices as above. 2. Multifocal bilateral airspace disease, with dense consolidation at the right base. Electronically Signed   By: Randa Ngo M.D.   On: 07/03/2019 22:44   ECHOCARDIOGRAM COMPLETE  Result Date: 07/05/2019    ECHOCARDIOGRAM REPORT   Patient Name:   Stephen Beard Date of Exam: 07/05/2019 Medical Rec #:  [id number]          Height:       67.0 in Accession #:    [id number]         Weight:       130.1 lb Date of Birth:  [birth year]          BSA:          1.684 m Patient Age:    23 years           BP:           119/64 mmHg Patient Gender: M                  HR:           78 bpm. Exam Location:  Inpatient Procedure: 2D Echo, Cardiac Doppler and Color Doppler Indications:    Shortness of breath  History:        Patient has no prior history of Echocardiogram examinations.                 Signs/Symptoms:Shortness of Breath. Brain tumor, s/p craniotomy.  Sonographer:    Dustin Flock Referring Phys: [place]  1. Left ventricular ejection fraction, by estimation, is 60 to 65%. The left ventricle has normal function. The left ventricle has no regional wall motion abnormalities. Left ventricular diastolic parameters were normal.  2. Right ventricular systolic function is normal. The right ventricular size is normal.  3. Left atrial size was mildly dilated.  4. The mitral valve is normal in structure.  Trivial mitral valve regurgitation. No evidence of mitral stenosis.  5. The aortic valve is  tricuspid. Aortic valve regurgitation is not visualized. No aortic stenosis is present.  6. The inferior vena cava is normal in size with greater than 50% respiratory variability, suggesting right atrial pressure of 3 mmHg. FINDINGS  Left Ventricle: Left ventricular ejection fraction, by estimation, is 60 to 65%. The left ventricle has normal function. The left ventricle has no regional wall motion abnormalities. The left ventricular internal cavity size was normal in size. There is  no left ventricular hypertrophy. Left ventricular diastolic parameters were normal. Right Ventricle: The right ventricular size is normal. No increase in right ventricular wall thickness. Right ventricular systolic function is normal. Left Atrium: Left atrial size was mildly dilated. Right Atrium: Right atrial size was normal in size. Pericardium: There is no evidence of pericardial effusion. Mitral Valve: The mitral valve is normal in structure. Normal mobility of the mitral valve leaflets. Trivial mitral valve regurgitation. No evidence of mitral valve stenosis. Tricuspid Valve: The tricuspid valve is normal in structure. Tricuspid valve regurgitation is mild . No evidence of tricuspid stenosis. Aortic Valve: The aortic valve is tricuspid. Aortic valve regurgitation is not visualized. No aortic stenosis is present. Pulmonic Valve: The pulmonic valve was normal in structure. Pulmonic valve regurgitation is not visualized. No evidence of pulmonic stenosis. Aorta: The aortic root is normal in size and structure. Venous: The inferior vena cava is normal in size with greater than 50% respiratory variability, suggesting right atrial pressure of 3 mmHg. IAS/Shunts: No atrial level shunt detected by color flow Doppler. Jenkins Rouge MD Electronically signed by Jenkins Rouge MD Signature Date/Time: 07/05/2019/4:36:37 PM    Final     ASSESSMENT & PLAN:  Anaplastic Oligodendroglioma  We appreciate the opportunity to care for Pyote. Today we introduced the brain tumor team here at [facility].  He understands we will review his case in greater detail in the clinic once he is discharged, including review of pathology terminology, imaging, clinical status, seizures, and likely treatment pathways.  For recurrent focal seizures and psychiatric symptoms, recommend initiating Lamotrigine titration.  Because he is inpatient this can be accelerated gently.  Recommend 25mg  daily x3 days, followed by 50mg  daily x3 days, followed by 100mg  daily.  He should be monitored for development of rash while on this titration.  Should remain of dexamethasone and keppra if possible.  We will closely follow up with him in the clinic upon discharge.     All questions were answered. The patient knows to call the clinic with any problems, questions or concerns.  The total time spent in the encounter was 55 minutes and more than 50% was on counseling and review of test results     [name], MD 07/11/2019 5:33 PM

## 2019-07-11 NOTE — Progress Notes (Signed)
Physical Therapy Treatment Patient Details Name: Stephen Beard MRN: [id number] DOB: 08-04-1995 Today's Date: 07/11/2019    History of Present Illness 24 y.o. admitted on 06/30/2019 with HA progressively worsening for months and blurry vision in L eye. 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 Pt underwent R occipital craniotomy and resection of glioma on 4/20. Pt required return to OR for R craniotomy for hematoma evacuation 2/2 ICH. Pt extubated on 4/22. PMH including ADHD.     PT Comments    Pt progressing daily with gait stability.  Goal of today's session was practice stairs for community access and safety.  Stairs reviewed as well as continued education on fall safety.  Pt continues to show some cognitive deficits related to [facility] and safety.  Next session to focus on high level balance activities.  PT will continue to follow acutely for safe mobility progression.    Follow Up Recommendations  Outpatient PT;Supervision for mobility/OOB     Equipment Recommendations  None recommended by PT    Recommendations for Other Services   NA     Precautions / Restrictions Precautions Precautions: Fall Precaution Comments: significant visual impairments and balance    Mobility  Bed Mobility Overal bed mobility: Needs Assistance Bed Mobility: Supine to Sit     Supine to sit: Supervision     General bed mobility comments: supervision for safety  Transfers Overall transfer level: Needs assistance Equipment used: None Transfers: Sit to/from Stand Sit to Stand: Supervision         General transfer comment: supervision for safety  Ambulation/Gait Ambulation/Gait assistance: Min guard;Supervision Gait Distance (Feet): 500 Feet Assistive device: None;1 person hand held assist Gait Pattern/deviations: Step-through pattern;Staggering left;Staggering right Gait velocity: decreased Gait velocity interpretation: 1.31 - 2.62 ft/sec, indicative of limited community  ambulator General Gait Details: Pt with mildly staggering gait pattern, but seems to be able to self correct.  Pt starting with min guard light hand held assist, but progressed to close supervision and no hand held assist. Continued education re: fall precautions: uneven surfaces outside, turn on full lights when getting up at night to go to the bathroom.    Stairs Stairs: Yes Stairs assistance: Min guard Stair Management: One rail Right;Alternating pattern;Forwards Number of Stairs: 10 General stair comments: Reviewed safety with stair management, use rail, go slow to ensure each time he steps his foot is fully on each step, especially with stairs like ours that are low contrast (all the same color). Min guard assist for safety.            Balance Overall balance assessment: Needs assistance Sitting-balance support: Feet supported;No upper extremity supported Sitting balance-Leahy Scale: Good Sitting balance - Comments: able to adjust sock in sitting without difficulty leaning forward   Standing balance support: No upper extremity supported;Single extremity supported Standing balance-Leahy Scale: Good Standing balance comment: close supervision for static standing.                             Cognition Arousal/Alertness: Awake/alert Behavior During Therapy: WFL for tasks assessed/performed Overall Cognitive Status: Impaired/Different from baseline Area of Impairment: Memory                   Current Attention Level: Selective Memory: Decreased short-term memory Following Commands: Follows one step commands consistently   Awareness: Emergent(needs cues for some anticipatory)   General Comments: Pt with some STM deficits, needs cues for anticipatory safety  awareness.          General Comments General comments (skin integrity, edema, etc.): [facility], but [facility] is also an option if he needs free therapy services.       Pertinent  Vitals/Pain Pain Assessment: 0-10 Pain Score: 4  Pain Location: headache Pain Descriptors / Indicators: Headache Pain Intervention(s): Limited activity within patient's tolerance;Monitored during session;Repositioned           PT Goals (current goals can now be found in the care plan section) Acute Rehab PT Goals Patient Stated Goal: To return to independence, improve vision Progress towards PT goals: Progressing toward goals    Frequency    Min 3X/week      PT Plan Current plan remains appropriate       AM-PAC PT "6 Clicks" Mobility   Outcome Measure  Help needed turning from your back to your side while in a flat bed without using bedrails?: None Help needed moving from lying on your back to sitting on the side of a flat bed without using bedrails?: None Help needed moving to and from a bed to a chair (including a wheelchair)?: None Help needed standing up from a chair using your arms (e.g., wheelchair or bedside chair)?: None Help needed to walk in hospital room?: A Little Help needed climbing 3-5 steps with a railing? : A Little 6 Click Score: 22    End of Session   Activity Tolerance: Patient tolerated treatment well Patient left: in chair;with call bell/phone within reach;with chair alarm set   PT Visit Diagnosis: Unsteadiness on feet (R26.81);Other abnormalities of gait and mobility (R26.89);Other symptoms and signs involving the nervous system [id number])     Time: 1010-1033 PT Time Calculation (min) (ACUTE ONLY): 23 min  Charges:  $Gait Training: 23-37 mins                    Verdene Lennert, PT, DPT  Acute Rehabilitation [phone] pager #[phone] office     07/11/2019, 10:50 AM

## 2019-07-12 MED ORDER — OXYCODONE HCL 10 MG PO TABS: 10.0000 mg | ORAL_TABLET | ORAL | 0 refills | Status: DC | PRN

## 2019-07-12 MED ORDER — LAMOTRIGINE 25 MG PO TABS: 25.0000 mg | ORAL_TABLET | Freq: Every day | ORAL | 2 refills | Status: DC

## 2019-07-12 MED ORDER — LAMOTRIGINE 25 MG PO TABS
25.0000 mg | ORAL_TABLET | Freq: Every day | ORAL | Status: DC
  Administered 2019-07-12: 25 mg via ORAL
  Filled 2019-07-12: qty 1

## 2019-07-12 NOTE — Progress Notes (Signed)
Discharge instructions given. Patient verbalized understanding and all questions were answered.  ?

## 2019-07-12 NOTE — Plan of Care (Signed)
  Problem: Education: Goal: Knowledge of General Education information will improve Description: Including pain rating scale, medication(s)/side effects and non-pharmacologic comfort measures Outcome: Progressing   Problem: Health Behavior/Discharge Planning: Goal: Ability to manage health-related needs will improve Outcome: Progressing   Problem: Clinical Measurements: Goal: Ability to maintain clinical measurements within normal limits will improve Outcome: Progressing Goal: Will remain free from infection Outcome: Progressing Goal: Diagnostic test results will improve Outcome: Progressing Goal: Respiratory complications will improve Outcome: Progressing Goal: Cardiovascular complication will be avoided Outcome: Progressing   Problem: Activity: Goal: Risk for activity intolerance will decrease Outcome: Progressing   Problem: Nutrition: Goal: Adequate nutrition will be maintained Outcome: Progressing   Problem: Coping: Goal: Level of anxiety will decrease Outcome: Progressing   Problem: Elimination: Goal: Will not experience complications related to bowel motility Outcome: Progressing Goal: Will not experience complications related to urinary retention Outcome: Progressing   Problem: Pain Managment: Goal: General experience of comfort will improve Outcome: Progressing   Problem: Safety: Goal: Ability to remain free from injury will improve Outcome: Progressing   Problem: Skin Integrity: Goal: Risk for impaired skin integrity will decrease Outcome: Progressing   Problem: Education: Goal: Knowledge of the prescribed therapeutic regimen will improve Outcome: Progressing   Problem: Clinical Measurements: Goal: Usual level of consciousness will be regained or maintained. Outcome: Progressing Goal: Neurologic status will improve Outcome: Progressing Goal: Ability to maintain intracranial pressure will improve Outcome: Progressing   Problem: Skin  Integrity: Goal: Demonstration of wound healing without infection will improve Outcome: Progressing   Problem: Education: Goal: Knowledge of General Education information will improve Description: Including pain rating scale, medication(s)/side effects and non-pharmacologic comfort measures Outcome: Progressing   Problem: Health Behavior/Discharge Planning: Goal: Ability to manage health-related needs will improve Outcome: Progressing   Problem: Clinical Measurements: Goal: Ability to maintain clinical measurements within normal limits will improve Outcome: Progressing Goal: Will remain free from infection Outcome: Progressing Goal: Diagnostic test results will improve Outcome: Progressing Goal: Respiratory complications will improve Outcome: Progressing Goal: Cardiovascular complication will be avoided Outcome: Progressing   Problem: Activity: Goal: Risk for activity intolerance will decrease Outcome: Progressing   Problem: Nutrition: Goal: Adequate nutrition will be maintained Outcome: Progressing   Problem: Coping: Goal: Level of anxiety will decrease Outcome: Progressing   Problem: Elimination: Goal: Will not experience complications related to bowel motility Outcome: Progressing Goal: Will not experience complications related to urinary retention Outcome: Progressing   Problem: Pain Managment: Goal: General experience of comfort will improve Outcome: Progressing   Problem: Safety: Goal: Ability to remain free from injury will improve Outcome: Progressing   Problem: Skin Integrity: Goal: Risk for impaired skin integrity will decrease Outcome: Progressing

## 2019-07-12 NOTE — Discharge Instructions (Addendum)
Discharge Instructions  No restriction in activities, slowly increase your activity back to normal.   Your incision is closed with non-absorbable sutures. These will be removed by Dr. Zada Finders at your 2 week follow up visit. If the sutures become bothersome or cause discomfort, apply some antibiotic ointment like bacitracin or neosporin on the sutures. This will soften them up and usually makes them more comfortable while they dissolve.  Okay to shower on the day of discharge. Be gentle when cleaning your incision. Use regular soap and water. If that is uncomfortable, try using baby shampoo. Do not submerge the wound under water for 2 weeks after surgery.  Follow up with Dr. Zada Finders in 2 weeks after discharge. If you do not already have a discharge appointment, please call his office at [phone] to schedule a follow up appointment. If you have any concerns or questions, please call the office and let us know.  You should also have a follow up appointment next week with Dr. Cecil Cobbs from neuro oncology to discuss further treatment. If you do not already have an appointment, call his office on Monday at [phone] to schedule an appointment.  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 If you feel unwell or develop a large rash on your body, stop taking it and come to the emergency department for further evaluation.

## 2019-07-12 NOTE — Progress Notes (Addendum)
Neurosurgery Service Progress Note  Subjective: NAE ON, no significant headaches, no new complaints today.  Objective: Vitals:   07/11/19 2200 07/11/19 2357 07/12/19 0343 07/12/19 0814  BP:  111/62 103/68 (!) 125/52  Pulse: 97 92 81 97  Resp: 19 20 20 19   Temp:  100.2 F (37.9 C) 100.3 F (37.9 C) 100.2 F (37.9 C)  TempSrc:  Oral Oral Oral  SpO2: 94% 92% 93% 97%  Weight:      Height:       Temp (24hrs), Avg:99.7 F (37.6 C), Min:97.6 F (36.4 C), Max:100.3 F (37.9 C)  CBC Latest Ref Rng & Units 07/09/2019 07/08/2019 07/07/2019  WBC 4.0 - 10.5 K/uL 18.4(H) 20.2(H) 14.1(H)  Hemoglobin 13.0 - 17.0 g/dL 10.9(L) 11.2(L) 10.0(L)  Hematocrit 39.0 - 52.0 % 32.2(L) 32.5(L) 29.4(L)  Platelets 150 - 400 K/uL 256 237 190   BMP Latest Ref Rng & Units 07/10/2019 07/09/2019 07/09/2019  Glucose 70 - 99 mg/dL - - -  BUN 6 - 20 mg/dL - - -  Creatinine 0.61 - 1.24 mg/dL - - -  Sodium 135 - 145 mmol/L 137 137 136  Potassium 3.5 - 5.1 mmol/L - - -  Chloride 98 - 111 mmol/L - - -  CO2 22 - 32 mmol/L - - -  Calcium 8.9 - 10.3 mg/dL - - -    Intake/Output Summary (Last 24 hours) at 07/12/2019 1048 Last data filed at 07/12/2019 0500 Gross per 24 hour  Intake 300 ml  Output 1250 ml  Net -950 ml    Current Facility-Administered Medications:  .  0.9 %  sodium chloride infusion, , Intravenous, PRN, Shellia Cleverly, MD .  acetaminophen (TYLENOL) tablet 650 mg, 650 mg, Oral, Q6H PRN, 650 mg at 07/11/19 0317 **OR** acetaminophen (TYLENOL) suppository 650 mg, 650 mg, Rectal, Q6H PRN, Judith Part, MD .  bethanechol (URECHOLINE) tablet 10 mg, 10 mg, Oral, QID, Bergman, Meghan D, NP, 10 mg at 07/11/19 2130 .  Chlorhexidine Gluconate Cloth 2 % PADS 6 each, 6 each, Topical, Daily, Judith Part, MD, 6 each at 07/11/19 1000 .  heparin injection 5,000 Units, 5,000 Units, Subcutaneous, Q8H, Judith Part, MD, 5,000 Units at 07/12/19 0518 .  HYDROmorphone (DILAUDID) injection 0.5 mg, 0.5  mg, Intravenous, Q2H PRN, Judith Part, MD, 0.5 mg at 07/06/19 2038 .  HYDROmorphone (DILAUDID) injection 1 mg, 1 mg, Intravenous, Q2H PRN, Judith Part, MD, 1 mg at 07/07/19 1147 .  ipratropium-albuterol (DUONEB) 0.5-2.5 (3) MG/3ML nebulizer solution 3 mL, 3 mL, Nebulization, Q4H PRN, Jennelle Human B, NP .  lamoTRIgine (LAMICTAL) tablet 25 mg, 25 mg, Oral, Daily, Lenor Provencher A, MD .  melatonin tablet 6 mg, 6 mg, Oral, QHS, Vallarie Mare, MD, 6 mg at 07/11/19 2130 .  nicotine (NICODERM CQ - dosed in mg/24 hours) patch 21 mg, 21 mg, Transdermal, Daily, Jahnay Lantier A, MD, 21 mg at 07/11/19 1000 .  ondansetron (ZOFRAN) tablet 4 mg, 4 mg, Oral, Q6H PRN **OR** ondansetron (ZOFRAN) injection 4 mg, 4 mg, Intravenous, Q6H PRN, Judith Part, MD, 4 mg at 07/04/19 2346 .  oxyCODONE (Oxy IR/ROXICODONE) immediate release tablet 10 mg, 10 mg, Oral, Q4H PRN, Judith Part, MD, 10 mg at 07/11/19 1759 .  oxyCODONE (OXYCONTIN) 12 hr tablet 15 mg, 15 mg, Oral, Q12H, Ashok Pall, MD, 15 mg at 07/11/19 2131 .  pantoprazole (PROTONIX) EC tablet 40 mg, 40 mg, Oral, Daily, Alvira Philips, [place], 40 mg at 07/11/19  1034 .  polyethylene glycol (MIRALAX / GLYCOLAX) packet 17 g, 17 g, Oral, Daily, Jennelle Human B, NP, 17 g at 07/11/19 1034 .  senna-docusate (Senokot-S) tablet 1 tablet, 1 tablet, Oral, Daily, Jennelle Human B, NP, 1 tablet at 07/11/19 1034   Physical Exam: Awake/alert, Ox3, PERRL, gaze neutral, +dense L field cut, FCx4 with full strength Incision c/d/i  Assessment & Plan: 24 y.o. 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 4/20 s/p craniotomy for resection, 4/21 post-op MRI with good resection, resection bed hematoma, midline shift decreased but still 7mm. 4/21 likely seizure, blown  pupil, emergently taken to OR x2 for hematoma evacuation, post-op CTH w/ good evacuation, expected residual in deep cavity, 4/22 awake/FC on vent, b/l infiltrates on CXR likely 2/2 aspiration during seizure, 4/22 extubated, final path anaplastic astro  -started lamotrigine, will give titration instructions and warnings regarding rash/etc -PT/OT now rec'ing outpt PT -SCDs/TEDs, SQH -doing well, okay to discharge home today with supervision  Judith Part  07/12/19 10:48 AM

## 2019-07-12 NOTE — Discharge Summary (Signed)
Discharge Summary  Date of Admission: 06/30/2019  Date of Discharge: 07/12/19  Attending Physician: Emelda Brothers, MD  Hospital Course: Patient presented to the ED with progressive headaches and multiple progressive neurologic abnormalities. An MRI showed a large right multifocal occipital mass. On 4/20 he went to the OR for a right occipital craniotomy for tumor resection. His post-op MRI showed good resection, with a resection bed hematoma and improved mass effect. On 4/21 he had a possible seizure and then rapidly declined with a blown pupil. He was therefore taken emergently back to the OR for hematoma evacuation. He was kept intubated for a post-op CT, which showed more hematoma, so he was taken back a second time for further hematoma evacuation. Following the repeat evacuation, CT showed improved evacuation. He had a likely aspiration event during this decompensation had developed b/l infiltrates on CXR that required continued intubation x24h. He was then extubated on 4/22 and continued to progress well without further issue. He continued to have auditory hallucinations and, given the possible seizure, was started on lamotrigine. His hospital course was otherwise uncomplicated and the patient was discharged home on 07/12/19. He will follow up in clinic with me in 2 weeks and Dr. Mickeal Skinner from neuro-oncology next week.  Neurologic exam at discharge:  AOx3, PERRL, +dense L hemianopsia, EOMI, FS, TM Strength 5/5 x4, SILTx4,  Discharge diagnosis: Brain tumor  Judith Part, MD 07/12/19 10:57 AM

## 2019-07-12 NOTE — Progress Notes (Signed)
Occupational Therapy Treatment Patient Details Name: Stephen Beard MRN: [id number] DOB: 1995-12-26 Today's Date: 07/12/2019    History of present illness 24 y.o. admitted on 06/30/2019 with HA progressively worsening for months and blurry vision in L eye. 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 Pt underwent R occipital craniotomy and resection of glioma on 4/20. Pt required return to OR for R craniotomy for hematoma evacuation 2/2 ICH. Pt extubated on 4/22. PMH including ADHD.    OT comments  Pt progressing towards acute OT goals. Focus of session was strategies for safe completion of UB/LB bathing, toileting and grooming tasks with balance, vision, and cognitive deficits. Pt happy to be d/c home today. Reports his boyfriend is able to take off work to stay with him 24/7. Discussed fall prevention and having someone with him while walking. Pt noted to bump into obstacles on left side and continues to have decreased safety awareness. D/c plan remains appropriate.    Follow Up Recommendations  Outpatient OT;Supervision - Intermittent    Equipment Recommendations  Tub/shower seat    Recommendations for Other Services      Precautions / Restrictions Precautions Precautions: Fall Precaution Comments: significant visual impairments and balance Restrictions Weight Bearing Restrictions: No       Mobility Bed Mobility Overal bed mobility: Needs Assistance Bed Mobility: Supine to Sit     Supine to sit: Supervision     General bed mobility comments: supervision for safety  Transfers Overall transfer level: Needs assistance Equipment used: None Transfers: Sit to/from Stand Sit to Stand: Min guard         General transfer comment: min guard for safety 2/2 impulsivity and decreased safety awareness.     Balance Overall balance assessment: Needs assistance Sitting-balance support: Feet supported;No upper extremity supported Sitting balance-Leahy Scale: Good      Standing balance support: No upper extremity supported;Single extremity supported Standing balance-Leahy Scale: Fair Standing balance comment: close supervision for static standing.                            ADL either performed or assessed with clinical judgement   ADL Overall ADL's : Needs assistance/impaired Eating/Feeding: Set up;Supervision/ safety;Sitting   Grooming: Oral care;Set up;Supervision/safety Grooming Details (indicate cue type and reason): Min A for balance and body awareness while standing at sink to perform hand hygiene. Cues for location of even large item of contrasting color placed in front of pt, 2/2 vision vs cognition? Upper Body Bathing: Set up;Supervision/ safety;Sitting   Lower Body Bathing: Minimal assistance;Sit to/from stand Lower Body Bathing Details (indicate cue type and reason): min A for balance, cues for safety Upper Body Dressing : Minimal assistance;Sitting   Lower Body Dressing: Minimal assistance;Sit to/from stand               Functional mobility during ADLs: Min guard General ADL Comments: Pt completed UB/LB bathing and grooming task in sit<>stand at sink. Pt then requesting to walk in the hall. Eager to d/c home today.      Vision   Additional Comments: Continues to report blurry vision. Noted to bump into obstacles on left side   Perception     Praxis      Cognition Arousal/Alertness: Awake/alert Behavior During Therapy: WFL for tasks assessed/performed Overall Cognitive Status: Impaired/Different from baseline  Exercises     Shoulder Instructions       General Comments      Pertinent Vitals/ Pain       Pain Assessment: Faces Faces Pain Scale: Hurts little more Pain Location: headache Pain Descriptors / Indicators: Headache Pain Intervention(s): Monitored during session  Home Living                                           Prior Functioning/Environment              Frequency  Min 3X/week        Progress Toward Goals  OT Goals(current goals can now be found in the care plan section)  Progress towards OT goals: Progressing toward goals  Acute Rehab OT Goals Patient Stated Goal: To return to independence, improve vision OT Goal Formulation: With patient Time For Goal Achievement: 07/19/19 Potential to Achieve Goals: Good ADL Goals Pt Will Perform Grooming: with set-up;with supervision;sitting Pt Will Perform Upper Body Dressing: with set-up;with supervision;sitting Pt Will Transfer to Toilet: with min assist;ambulating;bedside commode Pt Will Perform Toileting - Clothing Manipulation and hygiene: sitting/lateral leans;with min guard assist;sit to/from stand Additional ADL Goal #1: Pt will demonstrate selective attention during ADLs with 2-3 cues Additional ADL Goal #2: Pt will recall and perform three ADLs with Min cues  Plan Discharge plan remains appropriate    Co-evaluation                 AM-PAC OT "6 Clicks" Daily Activity     Outcome Measure   Help from another person eating meals?: A Little Help from another person taking care of personal grooming?: A Little Help from another person toileting, which includes using toliet, bedpan, or urinal?: A Little Help from another person bathing (including washing, rinsing, drying)?: A Little Help from another person to put on and taking off regular upper body clothing?: A Little Help from another person to put on and taking off regular lower body clothing?: A Little 6 Click Score: 18    End of Session    OT Visit Diagnosis: Unsteadiness on feet (R26.81);Other abnormalities of gait and mobility (R26.89);Muscle weakness (generalized) (M62.81);Pain   Activity Tolerance Patient tolerated treatment well   Patient Left in bed;with call bell/phone within reach;with bed alarm set   Nurse Communication          Time: [id number] OT  Time Calculation (min): 46 min  Charges: OT General Charges $OT Visit: 1 Visit OT Treatments $Self Care/Home Management : 38-52 mins  Tyrone Schimke, OT Acute Rehabilitation Services Pager: [phone] Office: [phone]    Stephen Beard 07/12/2019, 11:35 AM

## 2019-07-12 NOTE — Progress Notes (Signed)
Physical Therapy Treatment Patient Details Name: Stephen Beard MRN: [id number] DOB: Feb 21, 1996 Today's Date: 07/12/2019    History of Present Illness 24 y.o. admitted on 06/30/2019 with HA progressively worsening for months and blurry vision in L eye. 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 Pt underwent R occipital craniotomy and resection of glioma on 4/20. Pt required return to OR for R craniotomy for hematoma evacuation 2/2 ICH. Pt extubated on 4/22. PMH including ADHD.     PT Comments    Progressed to outdoor ambulation and re-practiced stairs utilizing teach back to see what carryover pt has of precaution education.  Pt needs up to min assist over outdoor surfaces.  Min guard on level obstacle free surfaces.     Follow Up Recommendations  Outpatient PT;Supervision for mobility/OOB     Equipment Recommendations  None recommended by PT    Recommendations for Other Services   NA     Precautions / Restrictions Precautions Precautions: Fall Precaution Comments: significant visual impairments and balance    Mobility  Bed Mobility Overal bed mobility: Needs Assistance Bed Mobility: Supine to Sit     Supine to sit: Supervision     General bed mobility comments: supervision for safety cues to sit on side of bed and not crawl in forward.   Transfers Overall transfer level: Needs assistance Equipment used: None Transfers: Sit to/from Stand Sit to Stand: Supervision         General transfer comment: close supervision  Ambulation/Gait Ambulation/Gait assistance: Min guard;Min assist Gait Distance (Feet): 500 Feet Assistive device: None;1 person hand held assist Gait Pattern/deviations: Step-through pattern;Staggering left;Staggering right Gait velocity: decreased Gait velocity interpretation: <1.31 ft/sec, indicative of household ambulator General Gait Details: Pt with mildly staggering gait pattern, tending to run into obstacles on his left side (some  peripherial vision deficits on top of blurred vision).  Up to heavy min assist with outdoor gait over uneven surfaces, varying surfaces and compliant surfaces (multch).  Cues for safety, and to always have someone with him when up.    Stairs Stairs: Yes Stairs assistance: Min guard Stair Management: One rail Right;Alternating pattern;Forwards Number of Stairs: 10 General stair comments: Pt able to report 1 of 2 precautions given to him (use rail, slowly feel that his entire foot is on the stair before stepping up).            Balance Overall balance assessment: Needs assistance Sitting-balance support: Feet supported;No upper extremity supported Sitting balance-Leahy Scale: Good     Standing balance support: No upper extremity supported Standing balance-Leahy Scale: Fair                              Cognition Arousal/Alertness: Awake/alert Behavior During Therapy: WFL for tasks assessed/performed Overall Cognitive Status: Impaired/Different from baseline Area of Impairment: Memory;Problem solving;Awareness;Safety/judgement;Attention                   Current Attention Level: Selective Memory: Decreased short-term memory Following Commands: Follows one step commands consistently;Follows multi-step commands with increased time Safety/Judgement: Decreased awareness of safety Awareness: Emergent(with some anticipatory as precautions are reinforced)   General Comments: Attempting to have pt teach back what he learned during his OT session (which OT told PT prior to session).  He was able to get larger picture items, but not in detail.               Pertinent Vitals/Pain Pain Assessment:  0-10 Pain Score: 4  Pain Location: headache Pain Descriptors / Indicators: Headache Pain Intervention(s): Limited activity within patient's tolerance;Monitored during session;Repositioned           PT Goals (current goals can now be found in the care plan section)  Acute Rehab PT Goals Patient Stated Goal: To return to independence, improve vision Progress towards PT goals: Progressing toward goals    Frequency    Min 3X/week      PT Plan Current plan remains appropriate       AM-PAC PT "6 Clicks" Mobility   Outcome Measure  Help needed turning from your back to your side while in a flat bed without using bedrails?: None Help needed moving from lying on your back to sitting on the side of a flat bed without using bedrails?: None Help needed moving to and from a bed to a chair (including a wheelchair)?: None Help needed standing up from a chair using your arms (e.g., wheelchair or bedside chair)?: None Help needed to walk in hospital room?: A Little Help needed climbing 3-5 steps with a railing? : A Little 6 Click Score: 22    End of Session   Activity Tolerance: Patient tolerated treatment well Patient left: in bed;with call bell/phone within reach;with bed alarm set Nurse Communication: Mobility status PT Visit Diagnosis: Unsteadiness on feet (R26.81);Other abnormalities of gait and mobility (R26.89);Other symptoms and signs involving the nervous system [id number])     Time: [id number] PT Time Calculation (min) (ACUTE ONLY): 33 min  Charges:  $Gait Training: 23-37 mins                    Verdene Lennert, PT, DPT  Acute Rehabilitation [phone] pager #[phone] office     07/12/2019, 6:12 PM

## 2019-07-15 ENCOUNTER — Telehealth: Payer: Self-pay | Admitting: Internal Medicine

## 2019-07-15 NOTE — Telephone Encounter (Signed)
Received a call from the pt to schedule a new pt appt w/Dr. Mickeal Skinner for brain tumor. Pt has been scheduled to see Dr. Mickeal Skinner on 5/7 at 11am. Pt aware to arrive 15 minutes early. A msg has been sent to neuro navigator to set pt up for radiation consult.

## 2019-07-16 ENCOUNTER — Encounter (HOSPITAL_COMMUNITY): Payer: Self-pay | Admitting: Internal Medicine

## 2019-07-19 ENCOUNTER — Encounter: Payer: Self-pay | Admitting: Internal Medicine

## 2019-07-19 ENCOUNTER — Other Ambulatory Visit: Payer: Self-pay

## 2019-07-19 ENCOUNTER — Inpatient Hospital Stay: Payer: Medicaid Other | Attending: Internal Medicine | Admitting: Internal Medicine

## 2019-07-19 VITALS — BP 101/65 | HR 105 | Temp 98.2°F | Resp 18 | Ht 67.0 in | Wt 140.2 lb

## 2019-07-19 DIAGNOSIS — C714 Malignant neoplasm of occipital lobe: Secondary | ICD-10-CM | POA: Diagnosis not present

## 2019-07-19 DIAGNOSIS — C719 Malignant neoplasm of brain, unspecified: Secondary | ICD-10-CM

## 2019-07-19 DIAGNOSIS — Z7189 Other specified counseling: Secondary | ICD-10-CM | POA: Insufficient documentation

## 2019-07-19 MED ORDER — LAMOTRIGINE 100 MG PO TABS: 100.0000 mg | ORAL_TABLET | Freq: Every day | ORAL | 3 refills | Status: DC

## 2019-07-19 NOTE — Progress Notes (Signed)
[facility] at [place], [place] [address] [phone]   New Patient Evaluation  Date of Service: 07/19/19 Patient Name: Stephen Beard Patient MRN: [id number] Patient DOB: [birth year] Provider: [name], MD  Identifying Statement:  Stephen Beard is a 24 y.o. 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    Referring Provider: [facility], [facility] [address] [place],  [place] [address]  Oncologic History: Oncology History  Anaplastic oligodendroglioma, 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[place])  07/02/2019 Surgery   Craniotomy, resection by Dr. Zada Finders. Followed by hematoma evacuation on 07/03/19.     Biomarkers:  MGMT Unknown.  IDH 1/2 Mutated.  EGFR Unknown  1p/19q co-deleted   History of Present Illness: The patient's records from the referring physician were obtained and reviewed and the patient interviewed to confirm this HPI.  Stephen Beard presented to medical attention with several months of progressive headaches and left sided visual impairment.  This was accompanied by episodes of deja-vu +paroxysmal burning smells which would occur almost daily over that time.  CNS imaging demonstrated large parieto-occipital mass with herniation syndrome.  Mass was resected/debulked, and post-operative course was complicated by intracranial hematoma and also respiratory failure.  At present, he is walking and talking, carries left sided visual impairment.  Currently on antibiotics for wound infection per Dr. Zada Finders.  Not requiring steroids.  Currently on lamictal 12m daily.  Medications: Current Outpatient Medications on File Prior to Visit  Medication Sig Dispense Refill  . lamoTRIgine (LAMICTAL) 25 MG tablet Take 1 tablet (25 mg total) by mouth daily. Take 1 tablet (228m daily for 2 days. On 07/15/19, start  taking 2 tablets (5063mdaily for 3 days. On 07/18/19, start taking 4 tablets (100m22maily. 30 tablet 2  . oxyCODONE-acetaminophen (PERCOCET/ROXICET) 5-325 MG tablet Take by mouth every 4 (four) hours as needed for severe pain.     No current facility-administered medications on file prior to visit.    Allergies: No Known Allergies Past Medical History:  Past Medical History:  Diagnosis Date  . ADHD    Past Surgical History:  Past Surgical History:  Procedure Laterality Date  . APPLICATION OF CRANIAL NAVIGATION N/A 07/02/2019   Procedure: APPLICATION OF CRANIAL NAVIGATION;  Surgeon: OsteJudith Part;  [facility] [place];  Laterality: N/A;  . CRANIOTOMY N/A 07/03/2019   Procedure: CRANIOTOMY HEMATOMA EVACUATION SUBDURAL;  Surgeon: OsteJudith Part;  [facility] [place] [place];  Laterality: N/A;  . CRANIOTOMY Right 07/03/2019   Procedure: CRANIOTOMY HEMATOMA EVACUATION SUBDURAL;  Surgeon: OsteJudith Part;  [facility] [place];  Laterality: Right;  . CRANIOTOMY Right 07/02/2019   Procedure: CRANIOTOMY TUMOR EXCISION with BRAILucky Rathkeurgeon: OsteJudith Part;  [facility] [place] [place];  Laterality: Right;  posterior   Social History:  Social History   Socioeconomic History  . Marital status: Single    Spouse name: Not on file  . Number of children: Not on file  . Years of education: Not on file  . Highest education level: Not on file  Occupational History  . Not on file  Tobacco Use  . Smoking status: Current Every Day Smoker  Substance and Sexual Activity  . Alcohol use: Yes  . Drug use: No  . Sexual activity: Not on file  Other Topics Concern  . Not  on file  Social History Narrative  . Not on file   Social Determinants of Health   Financial Resource Strain:   . Difficulty of Paying Living Expenses:   Food Insecurity:   . Worried About Charity fundraiser in the Last Year:     . Arboriculturist in the Last Year:   Transportation Needs:   . Film/video editor (Medical):   Marland Kitchen Lack of Transportation (Non-Medical):   Physical Activity:   . Days of Exercise per Week:   . Minutes of Exercise per Session:   Stress:   . Feeling of Stress :   Social Connections:   . Frequency of Communication with Friends and Family:   . Frequency of Social Gatherings with Friends and Family:   . Attends Religious Services:   . Active Member of Clubs or Organizations:   . Attends Archivist Meetings:   Marland Kitchen Marital Status:   Intimate Partner Violence:   . Fear of Current or Ex-Partner:   . Emotionally Abused:   Marland Kitchen Physically Abused:   . Sexually Abused:    Family History: History reviewed. No pertinent family history.  Review of Systems: Constitutional: Doesn't report fevers, chills or abnormal weight loss Eyes: Doesn't report blurriness of vision Ears, nose, mouth, throat, and face: Doesn't report sore throat Respiratory: Doesn't report cough, dyspnea or wheezes Cardiovascular: Doesn't report palpitation, chest discomfort  Gastrointestinal:  Doesn't report nausea, constipation, diarrhea GU: Doesn't report incontinence Skin: Doesn't report skin rashes Neurological: Per HPI Musculoskeletal: Doesn't report joint pain Behavioral/Psych: Doesn't report anxiety  Physical Exam: Vitals:   07/19/19 1212  BP: 101/65  Pulse: (!) 105  Resp: 18  Temp: 98.2 F (36.8 C)  SpO2: 100%   KPS: 80. General: Alert, cooperative, pleasant, in no acute distress Head: Normal EENT: No conjunctival injection or scleral icterus.  Lungs: Resp effort normal Cardiac: Regular rate Abdomen: Non-distended abdomen Skin: No rashes cyanosis or petechiae. Extremities: No clubbing or edema  Neurologic Exam: Mental Status: Awake, alert, attentive to examiner. Oriented to self and environment. Language is fluent with intact comprehension.  Cranial Nerves: Visual acuity is grossly  normal. Left homonymous hemianopia. Extra-ocular movements intact. No ptosis. Face is symmetric Motor: Tone and bulk are normal. Power is full in both arms and legs. Reflexes are symmetric, no pathologic reflexes present.  Sensory: Intact to light touch Gait: Sensory dystaxia   Labs: I have reviewed the data as listed    Component Value Date/Time   NA 137 07/10/2019 0249   K 4.2 07/09/2019 0341   CL 97 (L) 07/09/2019 0341   CO2 28 07/09/2019 0341   GLUCOSE 128 (H) 07/09/2019 0341   BUN 23 (H) 07/09/2019 0341   CREATININE 0.73 07/09/2019 0341   CALCIUM 8.6 (L) 07/09/2019 0341   PROT 5.5 (L) 07/05/2019 0422   ALBUMIN 2.8 (L) 07/09/2019 0341   AST 33 07/05/2019 0422   ALT 18 07/05/2019 0422   ALKPHOS 28 (L) 07/05/2019 0422   BILITOT 0.6 07/05/2019 0422   GFRNONAA >60 07/09/2019 0341   GFRAA >60 07/09/2019 0341   Lab Results  Component Value Date   WBC 18.4 (H) 07/09/2019   NEUTROABS 15.4 (H) 07/08/2019   HGB 10.9 (L) 07/09/2019   HCT 32.2 (L) 07/09/2019   MCV 92.5 07/09/2019   PLT 256 07/09/2019    Imaging:  CT HEAD WO CONTRAST  Result Date: 07/03/2019 CLINICAL DATA:  Postop hematoma evacuation. EXAM: CT HEAD WITHOUT CONTRAST TECHNIQUE: Contiguous axial  images were obtained from the base of the skull through the vertex without intravenous contrast. COMPARISON:  07/03/2019 at 5:37 p.m. FINDINGS: Brain: Sequelae of right parieto-occipital craniotomy for tumor resection are again identified. A hematoma in the medial right parietal operative bed is smaller following interval evacuation, now measuring 4.2 x 3.0 x 2.8 cm (AP x transverse x craniocaudal). Gas is noted along the operative tract in the medial right parieto-occipital region, and there is a new drain in place in this location. There is persistent extensive vasogenic edema in the posterior right cerebral hemisphere. Mass effect from the hematoma has decreased with leftward midline shift now measuring 12 mm (previously 17  mm). The lateral ventricles are slightly smaller than on the prior study. There is a small amount of extra-axial blood along the falx in the right parietal region. There is also minimal extra-axial blood over the posterior right cerebral convexity underneath the craniotomy. There is persistent partial effacement of the basilar cisterns. There is no cerebellar tonsillar herniation. No sizable acute cortically based infarct is identified. Vascular: No hyperdense vessel. Skull: Right parieto-occipital craniotomy with small volume fluid and gas in the scalp. Sinuses/Orbits: Unremarkable orbits. Minimal mucosal thickening in the paranasal sinuses. Clear mastoid air cells. Other: None. IMPRESSION: Decreased size of right parietal hematoma status post evacuation and drain placement. Persistent extensive vasogenic edema in the posterior right cerebral hemisphere with decreased leftward midline shift, now 12 mm. Electronically Signed   By: Logan Bores M.D.   On: 07/03/2019 20:35   CT HEAD WO CONTRAST  Result Date: 07/03/2019 CLINICAL DATA:  Seizure. Abnormal neurological examination following surgery. Status post tumor resection yesterday and subsequent postoperative hematoma evacuation today. EXAM: CT HEAD WITHOUT CONTRAST TECHNIQUE: Contiguous axial images were obtained from the base of the skull through the vertex without intravenous contrast. COMPARISON:  Head MRI 07/03/2019 FINDINGS: Brain: Postoperative changes are again seen from right parieto-occipital craniotomy. A hyperattenuating hematoma in the medial right parietal operative bed measures 5.3 x 3.6 x 4.3 cm (AP x transverse x craniocaudal), mildly smaller than on today's MRI. Extensive edema in the posterior right cerebrum is similar to the prior MRI, as is leftward midline shift which measures 17 mm. Postoperative gas is noted along the operative tract. The ventricles are unchanged in size with mild asymmetric dilatation of the right temporal horn again  noted. No sizable extra-axial fluid collection is present. There remains partial effacement of the basilar cisterns including the suprasellar cistern. There is no cerebellar tonsillar herniation. Vascular: No hyperdense vessel. Skull: Right parieto-occipital craniotomy with small volume fluid and gas in the scalp soft tissues and with skin staples in place. Sinuses/Orbits: Unremarkable orbits. Minimal mucosal thickening in the paranasal sinuses. Clear mastoid air cells. Other: None. IMPRESSION: Postoperative changes with residual hematoma in the right parietal operative bed, mildly smaller than on today's earlier MRI. Similar degree of edema and mass effect with 17 mm of midline shift. Electronically Signed   By: Logan Bores M.D.   On: 07/03/2019 19:12   CT HEAD WO CONTRAST  Result Date: 07/01/2019 CLINICAL DATA:  Benign neoplasm, brain/CNS volumetric scan for intraop navigation. EXAM: CT HEAD WITHOUT CONTRAST TECHNIQUE: Contiguous axial images were obtained from the base of the skull through the vertex without intravenous contrast. COMPARISON:  Brain MRI 06/30/2019, head CT 06/30/2019 FINDINGS: Brain: Examination performed for the purposes of intraoperative navigation. Again demonstrated are multiple parenchymal masses centered in the right parietooccipital region. As before, the largest component measures 4.1 cm. Redemonstrated calcification  associated with some of these masses. Prominent associated edema within the posterior right cerebral hemisphere and extending into the corpus callosum. Significant partial effacement of the right lateral and third ventricles. Unchanged 10 mm leftward midline shift measured at the level of the septum pellucidum. No interval intracranial abnormality. No extra-axial fluid collection. Vascular: No hyperdense vessel. Skull: Normal. Negative for fracture or focal lesion. Sinuses/Orbits: Visualized orbits demonstrate no acute abnormality. No significant paranasal sinus disease  or mastoid effusion at the imaged levels. IMPRESSION: CT imaging performed for the purposes of intraoperative navigation. Redemonstrated are multiple parenchymal masses centered within the right parietooccipital region, some of which have associated calcification. Prominent edema within the posterior right cerebral hemisphere and extending into the corpus callosum. Please refer to recent MRI head 06/30/2019 for favored differential considerations. Mass effect with significant partial effacement of the right lateral and third ventricles. Unchanged 10 mm leftward midline shift. Electronically Signed   By: Kellie Simmering DO   On: 07/01/2019 11:28   CT Head Wo Contrast  Result Date: 06/30/2019 CLINICAL DATA:  Progressive headache, blurred vision on the left. EXAM: CT HEAD WITHOUT CONTRAST TECHNIQUE: Contiguous axial images were obtained from the base of the skull through the vertex without intravenous contrast. COMPARISON:  None. FINDINGS: Brain: Large mass lesions noted in the right occipital lobe and right medial parietal lobe. At least 2 mass lesions appear to be present measuring up to 4.2 cm and 4.1 cm. There is extensive surrounding vasogenic edema. Mass effect on the lateral ventricles. 21 mm of right to left midline shift. No hydrocephalus. Vascular: No hyperdense vessel or unexpected calcification. Skull: No acute calvarial abnormality. Sinuses/Orbits: Visualized paranasal sinuses and mastoids clear. Orbital soft tissues unremarkable. Other: None IMPRESSION: Large solid mass lesions which are partially calcified noted in the right parietal and occipital lobes with 21 mm of right to left midline shift. Recommend further evaluation with MRI. Electronically Signed   By: Rolm Baptise M.D.   On: 06/30/2019 20:09   MR BRAIN W WO CONTRAST  Result Date: 07/03/2019 CLINICAL DATA:  Follow-up craniotomy. 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 EXAM: MRI HEAD WITHOUT AND WITH CONTRAST TECHNIQUE: Multiplanar, multiecho  pulse sequences of the brain and surrounding structures were obtained without and with intravenous contrast. CONTRAST:  36m GADAVIST GADOBUTROL 1 MMOL/ML IV SOLN COMPARISON:  CT and MRI studies of 06/30/2019 and 07/01/2019. FINDINGS: Brain: Interval right parieto-occipital craniotomy for debulking of the large malignant-appearing mass with the epicenter in the deep right medial parietal lobe. The vast majority of the enhancing tumor has been resected. Residual material in the postoperative space could be a combination of residual nonenhancing tumor and packing material. There does appear to be some indistinct contrast enhancement particularly towards the inferior margin suggesting that some of this represents residual tumor. Surrounding vasogenic white matter edema appears quite similar. Sub falcine herniation appears similar, though right-to-left midline shift is decreased from 17 mm 15 or 16 mm. Slight increase in size of the occipital horn and temporal horn of the right lateral ventricle could be due 2 diminished mass effect from the tumor debulking as well as some potential for localized trapping. No unexpected subdural collection or hemorrhagic complication. Vascular: Major vessels at the base of the brain show flow. Skull and upper cervical spine: Craniotomy changes as above. Sinuses/Orbits: Clear sinuses. Flattening of the posterior globes possibly related to chronic increased intracranial pressure Other: None IMPRESSION: Recent right parieto-occipital craniotomy for tumor debulking. The vast majority of the enhancing tumor has  been resected. Residual material in the operative space could be a combination of poorly enhancing tumor, blood products and packing material. Vasogenic edema appears similar. Mass effect with sub falcine herniation appears similar. Slight increased prominence of the right occipital horn and temporal horn could be due to diminished mass-effect upon those structures or could possibly  represent an element of trapping. The changes minor. Electronically Signed   By: Nelson Chimes M.D.   On: 07/03/2019 13:03   MR Brain W and Wo Contrast  Result Date: 07/01/2019 CLINICAL DATA:  Worsening headache for 2 months.  Encephalopathy. EXAM: MRI HEAD WITHOUT AND WITH CONTRAST TECHNIQUE: Multiplanar, multiecho pulse sequences of the brain and surrounding structures were obtained without and with intravenous contrast. CONTRAST:  59m GADAVIST GADOBUTROL 1 MMOL/ML IV SOLN COMPARISON:  Head CT 06/30/2019 FINDINGS: BRAIN: There is a large, heterogeneous mass of the posterior right hemisphere, centered in the right occipital lobe. There is marked mass effect on the right lateral ventricle. Leftward midline shift measures 10 mm. Other enhancing components measure up to 3.1 cm. No contralateral or infratentorial lesion. There is multifocal calcification within the mass. VASCULAR: Major flow voids are preserved. Susceptibility-sensitive sequences show no chronic microhemorrhage or superficial siderosis. SKULL AND UPPER CERVICAL SPINE: Normal calvarium and skull base. Visualized upper cervical spine and soft tissues are normal. SINUSES/ORBITS: No paranasal sinus fluid levels or advanced mucosal thickening. No mastoid or middle ear effusion. Normal orbits. IMPRESSION: Clustered enhancing masses of the right occipital lobe causing 10 mm of leftward midline shift with mass effect on the lateral ventricles. Differential considerations include oligodendroglioma, glioblastoma multiforme, extraventricular neurocytoma and metastatic disease. Electronically Signed   By: KUlyses JarredM.D.   On: 07/01/2019 00:16   DG Chest Port 1 View  Result Date: 07/07/2019 CLINICAL DATA:  Acute respiratory failure EXAM: PORTABLE CHEST 1 VIEW COMPARISON:  July 04, 2019 FINDINGS: The right basilar infiltrate on the previous study has significantly improved. Peripheral opacities remain in the right lung with worsening in the upper lung.  The opacity in the left lung is now in a somewhat peripheral location as well. The overall amount of infiltrate in the left lung is similar although the distribution is changed. Stable left central line. No pneumothorax. Cardiomediastinal silhouette is stable. IMPRESSION: 1. Persistent pulmonary infiltrates. The focal infiltrate in the right base has significantly improved. There is increasing peripheral opacity in the right upper lung. The overall amount of infiltrate on the left is similar although the distribution is changed. The findings are most consistent with an infectious process. Asymmetric edema considered less likely. Electronically Signed   By: DDorise BullionIII M.D   On: 07/07/2019 11:57   DG CHEST PORT 1 VIEW  Result Date: 07/04/2019 CLINICAL DATA:  Acute respiratory failure with hypoxia. EXAM: PORTABLE CHEST 1 VIEW COMPARISON:  Radiograph yesterday. FINDINGS: Left subclavian central line tip in the mid SVC. Interval extubation and removal of enteric tube. Patchy and heterogeneous bilateral lung opacities in a mid lower lung zone predominant distribution, with minimal improved aeration of the right lung base from yesterday otherwise unchanged. Unchanged heart size and mediastinal contours. No pneumothorax. IMPRESSION: 1. Interval extubation and removal of enteric tube. 2. Patchy and heterogeneous bilateral lung opacities with minimal improved aeration of the right lung base from yesterday. Electronically Signed   By: MKeith RakeM.D.   On: 07/04/2019 20:05   DG CHEST PORT 1 VIEW  Result Date: 07/03/2019 CLINICAL DATA:  Intubated, central line placement EXAM: PORTABLE CHEST  1 VIEW COMPARISON:  None. FINDINGS: Two frontal views of the chest demonstrate endotracheal tube overlying tracheal air column tip at level of thoracic inlet. Enteric catheter passes below diaphragm tip projects over gastric body. Left subclavian catheter tip projects over superior vena cava. Cardiac silhouette is  unremarkable. There is bilateral airspace disease, with greatest consolidation in the right middle and right lower lobes. No large effusion or pneumothorax. IMPRESSION: 1. Support devices as above. 2. Multifocal bilateral airspace disease, with dense consolidation at the right base. Electronically Signed   By: Randa Ngo M.D.   On: 07/03/2019 22:44   ECHOCARDIOGRAM COMPLETE  Result Date: 07/05/2019    ECHOCARDIOGRAM REPORT   Patient Name:   HUSSAM MUNIZ Date of Exam: 07/05/2019 Medical Rec #:  [id number]          Height:       67.0 in Accession #:    [id number]         Weight:       130.1 lb Date of Birth:  24-Oct-1995          BSA:          1.684 m Patient Age:    23 years           BP:           119/64 mmHg Patient Gender: M                  HR:           78 bpm. Exam Location:  Inpatient Procedure: 2D Echo, Cardiac Doppler and Color Doppler Indications:    Shortness of breath  History:        Patient has no prior history of Echocardiogram examinations.                 Signs/Symptoms:Shortness of Breath. Brain tumor, s/p craniotomy.  Sonographer:    Dustin Flock Referring Phys: [place]  1. Left ventricular ejection fraction, by estimation, is 60 to 65%. The left ventricle has normal function. The left ventricle has no regional wall motion abnormalities. Left ventricular diastolic parameters were normal.  2. Right ventricular systolic function is normal. The right ventricular size is normal.  3. Left atrial size was mildly dilated.  4. The mitral valve is normal in structure. Trivial mitral valve regurgitation. No evidence of mitral stenosis.  5. The aortic valve is tricuspid. Aortic valve regurgitation is not visualized. No aortic stenosis is present.  6. The inferior vena cava is normal in size with greater than 50% respiratory variability, suggesting right atrial pressure of 3 mmHg. FINDINGS  Left Ventricle: Left ventricular ejection fraction, by estimation, is 60 to  65%. The left ventricle has normal function. The left ventricle has no regional wall motion abnormalities. The left ventricular internal cavity size was normal in size. There is  no left ventricular hypertrophy. Left ventricular diastolic parameters were normal. Right Ventricle: The right ventricular size is normal. No increase in right ventricular wall thickness. Right ventricular systolic function is normal. Left Atrium: Left atrial size was mildly dilated. Right Atrium: Right atrial size was normal in size. Pericardium: There is no evidence of pericardial effusion. Mitral Valve: The mitral valve is normal in structure. Normal mobility of the mitral valve leaflets. Trivial mitral valve regurgitation. No evidence of mitral valve stenosis. Tricuspid Valve: The tricuspid valve is normal in structure. Tricuspid valve regurgitation is mild . No evidence of tricuspid stenosis. Aortic Valve: The aortic valve is  tricuspid. Aortic valve regurgitation is not visualized. No aortic stenosis is present. Pulmonic Valve: The pulmonic valve was normal in structure. Pulmonic valve regurgitation is not visualized. No evidence of pulmonic stenosis. Aorta: The aortic root is normal in size and structure. Venous: The inferior vena cava is normal in size with greater than 50% respiratory variability, suggesting right atrial pressure of 3 mmHg. IAS/Shunts: No atrial level shunt detected by color flow Doppler. Jenkins Rouge MD Electronically signed by Jenkins Rouge MD Signature Date/Time: 07/05/2019/4:36:37 PM    Final     Pathology: SURGICAL PATHOLOGY  CASE: [id number]  PATIENT: Duayne Cal  Surgical Pathology Report   Clinical History: brain tumor (cm)   FINAL MICROSCOPIC DIAGNOSIS:   A. BRAIN, RIGHT OCCIPITAL TUMOR, BIOPSY:  - Anaplastic oligodendroglioma, WHO grade III.  - See comment.   B. BRAIN, RIGHT OCCIPITAL TUMOR, RESECTION:  - Anaplastic oligodendroglioma, WHO grade III.  - See comment.   COMMENT:    Dr. Claudette Laws has reviewed the case and concurs with this  interpretation. Dr. Mickeal Skinner was paged on 07/04/2019. Additional studies  can be performed upon clinician request.    Assessment/Plan Anaplastic oligodendroglioma, 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[place]) [C71.9]  We appreciate the opportunity to participate in the care of Gaston.  He continues to demonstrate clinical improvement following his complicated hospital course and tumor debulking.  Surgical wound (under treatment) is weeping some purulence but improved from last week.    We extensively counseled him regarding his pathology, imaging, clinical deficits, treatment options, tumor genetics, prognosis, goals of care.  We ultimately recommended proceeding with course of intensity modulated radiation therapy and concurrent daily Temozolomide.  Radiation will be administered Mon-Fri over 6 weeks, Temodar will be dosed at 5m/m2 to be given daily over 42 days.  We reviewed side effects of temodar, including fatigue, nausea/vomiting, constipation, and cytopenias.  Chemotherapy should be held for the following:  ANC less than 1,000  Platelets less than 100,000  LFT or creatinine greater than 2x ULN  If clinical concerns/contraindications develop  Every 2 weeks during radiation, labs will be checked accompanied by a clinical evaluation in the brain tumor clinic.  He has consultation with Dr. MLisbeth Renshawscheduled for 07/23/19.    Treatment should be withheld until antibiotic course is completed and wound has healed appropriately.  Screening for potential clinical trials was performed and discussed using eligibility criteria [facility], as well as national database available on [email or web address]    The patient is not a candidate for a research protocol at this time due to no suitable study identified.   We spent twenty additional minutes teaching regarding the  natural history, biology, and historical experience in the treatment of brain tumors. We then discussed in detail the current recommendations for therapy focusing on the mode of administration, mechanism of action, anticipated toxicities, and quality of life issues associated with this plan. We also provided teaching sheets for the patient to take home as an additional resource.  All questions were answered. The patient knows to call the clinic with any problems, questions or concerns. No barriers to learning were detected.  The total time spent in the encounter was 60 minutes and more than 50% was on counseling and review of test results   ZVentura Sellers MD Medical Director of Neuro-Oncology [facility]at [place]05/07/21 12:14 PM

## 2019-07-22 ENCOUNTER — Other Ambulatory Visit: Payer: Self-pay | Admitting: Internal Medicine

## 2019-07-22 MED ORDER — LAMOTRIGINE 100 MG PO TABS: 100.0000 mg | ORAL_TABLET | Freq: Every day | ORAL | 3 refills | Status: DC

## 2019-07-22 NOTE — Progress Notes (Signed)
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  This was accompanied by episodes of deja-vu, paroxysmal burning smells which would occur almost daily over that time.  CT Head 07/03/2019: Decreased size of right parietal hematoma status post evacuation and drain placement. Persistent extensive vasogenic edema in the posterior right cerebral hemisphere with decreased leftward midline shift, now 12 mm.  CT Head 07/03/2019: Postoperative changes with residual hematoma in the right parietal operative bed, mildly smaller than on today's earlier MRI. Similar degree of edema and mass effect with 17 mm of midline shift.  MRI Brain 07/03/2019: Recent right parieto-occipital craniotomy for tumor debulking. The vast majority of the enhancing tumor has been resected. Residual material in the operative space could be a combination of poorly enhancing tumor, blood products and packing material. Vasogenic edema appears similar.  MRI Brain 06/30/2019: Clustered enhancing masses of the right occipital lobe causing 10 mm of leftward midline shift with mass effect on the lateral ventricles.  Differential considerations include oligodendroglioma, glioblastoma multiforme, extraventricular neurocytoma and metastatic disease.  CT Head 06/30/2019: Large solid mass lesion which are partially calcified noted in the right parietal and occipital lobes with 21 mm of right to left midline shift.  Biopsy: Right Occipital Lobe 07/02/2019   Past or anticipated interventions, if any, per neurosurgery:  Dr. Zada Finders  -Craniotomy, tumor resection, hematoma evacuation 07/03/2019. -He still has sutures in place.   -Will see Ostergard 07/24/2019.   Past or anticipated interventions, if any, per medical oncology:  Dr. Mickeal Skinner 07/19/2019 - We ultimately recommended proceeding with course of intensity modulated radiation therapy and  concurrent daily Temozolomide.  Radiation will be administered Mon-Fri over 6 weeks, Temodar will be dosed at 75mg /m2 to be given daily over 42 days.  We reviewed side effects of temodar, including fatigue, nausea/vomiting, constipation, and cytopenias. -Every 2 weeks during radiation, labs will be checked accompanied by a clinical evaluation in the brain tumor clinic. -He has consultation with Dr. Lisbeth Renshaw scheduled for 07/23/19.   -Treatment should be withheld until antibiotic course is completed and wound has healed appropriately. -The patient is not a candidate for a research protocol at this time due to no suitable study identified.   Dose of Decadron, if applicable: None  Recent neurologic symptoms, if any:   Seizures: No, taking lamictal daily.  Headaches: Yes, takes medications.  Nausea: No  Dizziness/ataxia: Has some dizziness on occasion when going from sitting to standing.  Difficulty with hand coordination: No  Focal numbness/weakness: Has some weakness in his bilateral legs.  Visual deficits/changes: Blurred vision in both eyes.  Confusion/Memory deficits: No  Pain in bilateral legs from inactivity and headaches.  SAFETY ISSUES:  Prior radiation? No  Pacemaker/ICD? No  Possible current pregnancy? n/a  Is the patient on methotrexate? No   Additional Complaints / other details:

## 2019-07-23 ENCOUNTER — Ambulatory Visit
Admission: RE | Admit: 2019-07-23 | Discharge: 2019-07-23 | Disposition: A | Discharge status: Home / Self Care | Payer: Medicaid Other | Source: Ambulatory Visit | Attending: Radiation Oncology | Admitting: Radiation Oncology

## 2019-07-23 ENCOUNTER — Other Ambulatory Visit: Payer: Self-pay

## 2019-07-23 ENCOUNTER — Encounter: Payer: Self-pay | Admitting: Radiation Oncology

## 2019-07-23 ENCOUNTER — Telehealth: Payer: Self-pay | Admitting: Pharmacist

## 2019-07-23 DIAGNOSIS — Z510 Encounter for antineoplastic radiation therapy: Secondary | ICD-10-CM | POA: Insufficient documentation

## 2019-07-23 DIAGNOSIS — C714 Malignant neoplasm of occipital lobe: Secondary | ICD-10-CM

## 2019-07-23 DIAGNOSIS — C719 Malignant neoplasm of brain, unspecified: Secondary | ICD-10-CM | POA: Insufficient documentation

## 2019-07-23 NOTE — Progress Notes (Signed)
Radiation Oncology         [phone] ________________________________  Name: Stephen Beard        MRN: [id number]  Date of Service: 07/23/2019 DOB: 07/07/1995  KD:[id number], [place]  Ostergard, Joyice Faster, MD     REFERRING PHYSICIAN: Judith Part, MD   DIAGNOSIS: The encounter diagnosis was Anaplastic oligodendroglioma, 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[place]).   HISTORY OF PRESENT ILLNESS: Stephen Beard is a 24 y.o. male seen at the request of Dr. Mickeal Skinner for a newly diagnosed grade 3 anaplastic oligodendroglioma.  The patient had presented with progressive headaches, changes in his visual field on the left, and episodes of burning smells that progressively became more frequent. He presented to the emergency room on 06/30/2019 and a CT of the head without contrast revealed  large solid mass lesions that were partially calcified in the right parietal and occipital lobes with a 21 mm right to left midline shift, the masses measured up to 4 to 0.2 cm and 4.1 cm with surrounding vasogenic edema with mass-effect on the lateral ventricles.  An MRI with and without contrast revealed clustered enhancing masses in the right occipital lobe causing 10 mm of leftward midline shift with mass-effect on the lateral ventricles, the large mass in the posterior right hemisphere centered in the right occipital lobe was identified as the main site of disease and other enhancing components measured up to 3.1 cm.  He was seen by neurosurgery and subsequently underwent craniotomy with tumor excision on 07/02/2019, unfortunately the patient did have to go back for evacuation of a subdural hematoma postoperatively.  Following the evacuation, the hematoma now measured 4.2 x 3 x 2.8 cm, gas along perioperative tract in the medial right parieto-occipital region was noted and there was a drain in place at this site.  There is mass at affect from the hematoma that had decreased with leftward  midline shift now 12 mm, prior surgery has been 17 mm.  The lateral ventricles are slightly smaller following surgery and there was a small amount of extra-axial blood along the falx in the right parietal region and minimal extra-axial blood over the posterior right cerebral convex any but the craniotomy site.  Final pathology revealed grade 3 anaplastic oligodendroglioma.  The patient has met with Dr. Mickeal Skinner during his hospitalization, he was having some focal seizures postoperatively and some psychiatric symptoms, but his medications have been managed under Dr. Renda Rolls care.  He saw Dr. Mickeal Skinner on 07/19/2019 as an outpatient to discuss the findings, his IDH status is mutated.  Dr. Mickeal Skinner discussed the rationale for chemoradiation, and is seen today via MyChart to discuss moving forward with this process.    PREVIOUS RADIATION THERAPY: No   PAST MEDICAL HISTORY:  Past Medical History:  Diagnosis Date  . ADHD        PAST SURGICAL HISTORY: Past Surgical History:  Procedure Laterality Date  . APPLICATION OF CRANIAL NAVIGATION N/A 07/02/2019   Procedure: APPLICATION OF CRANIAL NAVIGATION;  Surgeon: Judith Part, MD;  Location: [place];  Service: Neurosurgery;  Laterality: N/A;  . CRANIOTOMY N/A 07/03/2019   Procedure: CRANIOTOMY HEMATOMA EVACUATION SUBDURAL;  Surgeon: Judith Part, MD;  Location: [place];  Service: Neurosurgery;  Laterality: N/A;  . CRANIOTOMY Right 07/03/2019   Procedure: CRANIOTOMY HEMATOMA EVACUATION SUBDURAL;  Surgeon: Judith Part, MD;  Location: [place];  Service: Neurosurgery;  Laterality: Right;  . CRANIOTOMY Right 07/02/2019   Procedure: CRANIOTOMY TUMOR EXCISION with  Lucky Rathke;  Surgeon: Judith Part, MD;  Location: [place];  Service: Neurosurgery;  Laterality: Right;  posterior     FAMILY HISTORY: No family history on file.   SOCIAL HISTORY:  reports that he has been smoking. He does not have any smokeless tobacco history on file. He reports  current alcohol use. He reports that he does not use drugs. The patient is in a relationship and lives with his partner Ronald Pippins. He works at [employer] as a Tourist information centre manager. He lives in [place].    ALLERGIES: Patient has no known allergies.   MEDICATIONS:  Current Outpatient Medications  Medication Sig Dispense Refill  . lamoTRIgine (LAMICTAL) 100 MG tablet Take 1 tablet (100 mg total) by mouth daily. Take 1 tablet 156m daily in AM 30 tablet 3  . oxyCODONE-acetaminophen (PERCOCET/ROXICET) 5-325 MG tablet Take by mouth every 4 (four) hours as needed for severe pain.     No current facility-administered medications for this encounter.     REVIEW OF SYSTEMS: On review of systems, the patient reports that he is doing well overall. He has occasional discomfort in the scalp area without significant headaches, visual or olfactory changes. He denies any nausea. No other complaints are noted. A complete review of systems is obtained and is otherwise negative.     PHYSICAL EXAM:  Wt Readings from Last 3 Encounters:  07/19/19 140 lb 3.2 oz (63.6 kg)  07/02/19 130 lb 1.1 oz (59 kg)  12/18/18 130 lb (59 kg)   In general this is a well appearing caucasian male in no acute distress. He's alert and oriented x4 and appropriate throughout the examination. Cardiopulmonary assessment is negative for acute distress and he exhibits normal effort. His posterior craniotomy incision site is seen in the midline and appears to be well healed.    ECOG = 1  0 - Asymptomatic (Fully active, able to carry on all predisease activities without restriction)  1 - Symptomatic but completely ambulatory (Restricted in physically strenuous activity but ambulatory and able to carry out work of a light or sedentary nature. For example, light housework, office work)  2 - Symptomatic, <50% in bed during the day (Ambulatory and capable of all self care but unable to carry out any work activities. Up and about more than 50% of waking  hours)  3 - Symptomatic, >50% in bed, but not bedbound (Capable of only limited self-care, confined to bed or chair 50% or more of waking hours)  4 - Bedbound (Completely disabled. Cannot carry on any self-care. Totally confined to bed or chair)  5 - Death   OEustace PenMM, Creech RH, Tormey DC, et al. [phone]. "Toxicity and response criteria of the [facility]". AHavreOncol. 5 (6): 649-55    LABORATORY DATA:  Lab Results  Component Value Date   WBC 18.4 (H) 07/09/2019   HGB 10.9 (L) 07/09/2019   HCT 32.2 (L) 07/09/2019   MCV 92.5 07/09/2019   PLT 256 07/09/2019   Lab Results  Component Value Date   NA 137 07/10/2019   K 4.2 07/09/2019   CL 97 (L) 07/09/2019   CO2 28 07/09/2019   Lab Results  Component Value Date   ALT 18 07/05/2019   AST 33 07/05/2019   ALKPHOS 28 (L) 07/05/2019   BILITOT 0.6 07/05/2019      RADIOGRAPHY: CT HEAD WO CONTRAST  Result Date: 07/03/2019 CLINICAL DATA:  Postop hematoma evacuation. EXAM: CT HEAD WITHOUT CONTRAST TECHNIQUE: Contiguous axial images were obtained  from the base of the skull through the vertex without intravenous contrast. COMPARISON:  07/03/2019 at 5:37 p.m. FINDINGS: Brain: Sequelae of right parieto-occipital craniotomy for tumor resection are again identified. A hematoma in the medial right parietal operative bed is smaller following interval evacuation, now measuring 4.2 x 3.0 x 2.8 cm (AP x transverse x craniocaudal). Gas is noted along the operative tract in the medial right parieto-occipital region, and there is a new drain in place in this location. There is persistent extensive vasogenic edema in the posterior right cerebral hemisphere. Mass effect from the hematoma has decreased with leftward midline shift now measuring 12 mm (previously 17 mm). The lateral ventricles are slightly smaller than on the prior study. There is a small amount of extra-axial blood along the falx in the right parietal region. There  is also minimal extra-axial blood over the posterior right cerebral convexity underneath the craniotomy. There is persistent partial effacement of the basilar cisterns. There is no cerebellar tonsillar herniation. No sizable acute cortically based infarct is identified. Vascular: No hyperdense vessel. Skull: Right parieto-occipital craniotomy with small volume fluid and gas in the scalp. Sinuses/Orbits: Unremarkable orbits. Minimal mucosal thickening in the paranasal sinuses. Clear mastoid air cells. Other: None. IMPRESSION: Decreased size of right parietal hematoma status post evacuation and drain placement. Persistent extensive vasogenic edema in the posterior right cerebral hemisphere with decreased leftward midline shift, now 12 mm. Electronically Signed   By: Logan Bores M.D.   On: 07/03/2019 20:35   CT HEAD WO CONTRAST  Result Date: 07/03/2019 CLINICAL DATA:  Seizure. Abnormal neurological examination following surgery. Status post tumor resection yesterday and subsequent postoperative hematoma evacuation today. EXAM: CT HEAD WITHOUT CONTRAST TECHNIQUE: Contiguous axial images were obtained from the base of the skull through the vertex without intravenous contrast. COMPARISON:  Head MRI 07/03/2019 FINDINGS: Brain: Postoperative changes are again seen from right parieto-occipital craniotomy. A hyperattenuating hematoma in the medial right parietal operative bed measures 5.3 x 3.6 x 4.3 cm (AP x transverse x craniocaudal), mildly smaller than on today's MRI. Extensive edema in the posterior right cerebrum is similar to the prior MRI, as is leftward midline shift which measures 17 mm. Postoperative gas is noted along the operative tract. The ventricles are unchanged in size with mild asymmetric dilatation of the right temporal horn again noted. No sizable extra-axial fluid collection is present. There remains partial effacement of the basilar cisterns including the suprasellar cistern. There is no cerebellar  tonsillar herniation. Vascular: No hyperdense vessel. Skull: Right parieto-occipital craniotomy with small volume fluid and gas in the scalp soft tissues and with skin staples in place. Sinuses/Orbits: Unremarkable orbits. Minimal mucosal thickening in the paranasal sinuses. Clear mastoid air cells. Other: None. IMPRESSION: Postoperative changes with residual hematoma in the right parietal operative bed, mildly smaller than on today's earlier MRI. Similar degree of edema and mass effect with 17 mm of midline shift. Electronically Signed   By: Logan Bores M.D.   On: 07/03/2019 19:12   CT HEAD WO CONTRAST  Result Date: 07/01/2019 CLINICAL DATA:  Benign neoplasm, brain/CNS volumetric scan for intraop navigation. EXAM: CT HEAD WITHOUT CONTRAST TECHNIQUE: Contiguous axial images were obtained from the base of the skull through the vertex without intravenous contrast. COMPARISON:  Brain MRI 06/30/2019, head CT 06/30/2019 FINDINGS: Brain: Examination performed for the purposes of intraoperative navigation. Again demonstrated are multiple parenchymal masses centered in the right parietooccipital region. As before, the largest component measures 4.1 cm. Redemonstrated calcification associated with some  of these masses. Prominent associated edema within the posterior right cerebral hemisphere and extending into the corpus callosum. Significant partial effacement of the right lateral and third ventricles. Unchanged 10 mm leftward midline shift measured at the level of the septum pellucidum. No interval intracranial abnormality. No extra-axial fluid collection. Vascular: No hyperdense vessel. Skull: Normal. Negative for fracture or focal lesion. Sinuses/Orbits: Visualized orbits demonstrate no acute abnormality. No significant paranasal sinus disease or mastoid effusion at the imaged levels. IMPRESSION: CT imaging performed for the purposes of intraoperative navigation. Redemonstrated are multiple parenchymal masses  centered within the right parietooccipital region, some of which have associated calcification. Prominent edema within the posterior right cerebral hemisphere and extending into the corpus callosum. Please refer to recent MRI head 06/30/2019 for favored differential considerations. Mass effect with significant partial effacement of the right lateral and third ventricles. Unchanged 10 mm leftward midline shift. Electronically Signed   By: Kellie Simmering DO   On: 07/01/2019 11:28   CT Head Wo Contrast  Result Date: 06/30/2019 CLINICAL DATA:  Progressive headache, blurred vision on the left. EXAM: CT HEAD WITHOUT CONTRAST TECHNIQUE: Contiguous axial images were obtained from the base of the skull through the vertex without intravenous contrast. COMPARISON:  None. FINDINGS: Brain: Large mass lesions noted in the right occipital lobe and right medial parietal lobe. At least 2 mass lesions appear to be present measuring up to 4.2 cm and 4.1 cm. There is extensive surrounding vasogenic edema. Mass effect on the lateral ventricles. 21 mm of right to left midline shift. No hydrocephalus. Vascular: No hyperdense vessel or unexpected calcification. Skull: No acute calvarial abnormality. Sinuses/Orbits: Visualized paranasal sinuses and mastoids clear. Orbital soft tissues unremarkable. Other: None IMPRESSION: Large solid mass lesions which are partially calcified noted in the right parietal and occipital lobes with 21 mm of right to left midline shift. Recommend further evaluation with MRI. Electronically Signed   By: Rolm Baptise M.D.   On: 06/30/2019 20:09   MR BRAIN W WO CONTRAST  Result Date: 07/03/2019 CLINICAL DATA:  Follow-up craniotomy. 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 EXAM: MRI HEAD WITHOUT AND WITH CONTRAST TECHNIQUE: Multiplanar, multiecho pulse sequences of the brain and surrounding structures were obtained without and with intravenous contrast. CONTRAST:  1m GADAVIST GADOBUTROL 1 MMOL/ML IV SOLN  COMPARISON:  CT and MRI studies of 06/30/2019 and 07/01/2019. FINDINGS: Brain: Interval right parieto-occipital craniotomy for debulking of the large malignant-appearing mass with the epicenter in the deep right medial parietal lobe. The vast majority of the enhancing tumor has been resected. Residual material in the postoperative space could be a combination of residual nonenhancing tumor and packing material. There does appear to be some indistinct contrast enhancement particularly towards the inferior margin suggesting that some of this represents residual tumor. Surrounding vasogenic white matter edema appears quite similar. Sub falcine herniation appears similar, though right-to-left midline shift is decreased from 17 mm 15 or 16 mm. Slight increase in size of the occipital horn and temporal horn of the right lateral ventricle could be due 2 diminished mass effect from the tumor debulking as well as some potential for localized trapping. No unexpected subdural collection or hemorrhagic complication. Vascular: Major vessels at the base of the brain show flow. Skull and upper cervical spine: Craniotomy changes as above. Sinuses/Orbits: Clear sinuses. Flattening of the posterior globes possibly related to chronic increased intracranial pressure Other: None IMPRESSION: Recent right parieto-occipital craniotomy for tumor debulking. The vast majority of the enhancing tumor has been resected. Residual  material in the operative space could be a combination of poorly enhancing tumor, blood products and packing material. Vasogenic edema appears similar. Mass effect with sub falcine herniation appears similar. Slight increased prominence of the right occipital horn and temporal horn could be due to diminished mass-effect upon those structures or could possibly represent an element of trapping. The changes minor. Electronically Signed   By: Nelson Chimes M.D.   On: 07/03/2019 13:03   MR Brain W and Wo Contrast  Result  Date: 07/01/2019 CLINICAL DATA:  Worsening headache for 2 months.  Encephalopathy. EXAM: MRI HEAD WITHOUT AND WITH CONTRAST TECHNIQUE: Multiplanar, multiecho pulse sequences of the brain and surrounding structures were obtained without and with intravenous contrast. CONTRAST:  66m GADAVIST GADOBUTROL 1 MMOL/ML IV SOLN COMPARISON:  Head CT 06/30/2019 FINDINGS: BRAIN: There is a large, heterogeneous mass of the posterior right hemisphere, centered in the right occipital lobe. There is marked mass effect on the right lateral ventricle. Leftward midline shift measures 10 mm. Other enhancing components measure up to 3.1 cm. No contralateral or infratentorial lesion. There is multifocal calcification within the mass. VASCULAR: Major flow voids are preserved. Susceptibility-sensitive sequences show no chronic microhemorrhage or superficial siderosis. SKULL AND UPPER CERVICAL SPINE: Normal calvarium and skull base. Visualized upper cervical spine and soft tissues are normal. SINUSES/ORBITS: No paranasal sinus fluid levels or advanced mucosal thickening. No mastoid or middle ear effusion. Normal orbits. IMPRESSION: Clustered enhancing masses of the right occipital lobe causing 10 mm of leftward midline shift with mass effect on the lateral ventricles. Differential considerations include oligodendroglioma, glioblastoma multiforme, extraventricular neurocytoma and metastatic disease. Electronically Signed   By: KUlyses JarredM.D.   On: 07/01/2019 00:16   DG Chest Port 1 View  Result Date: 07/07/2019 CLINICAL DATA:  Acute respiratory failure EXAM: PORTABLE CHEST 1 VIEW COMPARISON:  July 04, 2019 FINDINGS: The right basilar infiltrate on the previous study has significantly improved. Peripheral opacities remain in the right lung with worsening in the upper lung. The opacity in the left lung is now in a somewhat peripheral location as well. The overall amount of infiltrate in the left lung is similar although the  distribution is changed. Stable left central line. No pneumothorax. Cardiomediastinal silhouette is stable. IMPRESSION: 1. Persistent pulmonary infiltrates. The focal infiltrate in the right base has significantly improved. There is increasing peripheral opacity in the right upper lung. The overall amount of infiltrate on the left is similar although the distribution is changed. The findings are most consistent with an infectious process. Asymmetric edema considered less likely. Electronically Signed   By: DDorise BullionIII M.D   On: 07/07/2019 11:57   DG CHEST PORT 1 VIEW  Result Date: 07/04/2019 CLINICAL DATA:  Acute respiratory failure with hypoxia. EXAM: PORTABLE CHEST 1 VIEW COMPARISON:  Radiograph yesterday. FINDINGS: Left subclavian central line tip in the mid SVC. Interval extubation and removal of enteric tube. Patchy and heterogeneous bilateral lung opacities in a mid lower lung zone predominant distribution, with minimal improved aeration of the right lung base from yesterday otherwise unchanged. Unchanged heart size and mediastinal contours. No pneumothorax. IMPRESSION: 1. Interval extubation and removal of enteric tube. 2. Patchy and heterogeneous bilateral lung opacities with minimal improved aeration of the right lung base from yesterday. Electronically Signed   By: MKeith RakeM.D.   On: 07/04/2019 20:05   DG CHEST PORT 1 VIEW  Result Date: 07/03/2019 CLINICAL DATA:  Intubated, central line placement EXAM: PORTABLE CHEST 1 VIEW COMPARISON:  None. FINDINGS: Two frontal views of the chest demonstrate endotracheal tube overlying tracheal air column tip at level of thoracic inlet. Enteric catheter passes below diaphragm tip projects over gastric body. Left subclavian catheter tip projects over superior vena cava. Cardiac silhouette is unremarkable. There is bilateral airspace disease, with greatest consolidation in the right middle and right lower lobes. No large effusion or pneumothorax.  IMPRESSION: 1. Support devices as above. 2. Multifocal bilateral airspace disease, with dense consolidation at the right base. Electronically Signed   By: Randa Ngo M.D.   On: 07/03/2019 22:44   ECHOCARDIOGRAM COMPLETE  Result Date: 07/05/2019    ECHOCARDIOGRAM REPORT   Patient Name:   COHEN BOETTNER Date of Exam: 07/05/2019 Medical Rec #:  [id number]          Height:       67.0 in Accession #:    [id number]         Weight:       130.1 lb Date of Birth:  [birth year]          BSA:          1.684 m Patient Age:    23 years           BP:           119/64 mmHg Patient Gender: M                  HR:           78 bpm. Exam Location:  Inpatient Procedure: 2D Echo, Cardiac Doppler and Color Doppler Indications:    Shortness of breath  History:        Patient has no prior history of Echocardiogram examinations.                 Signs/Symptoms:Shortness of Breath. Brain tumor, s/p craniotomy.  Sonographer:    Dustin Flock Referring Phys: [place]  1. Left ventricular ejection fraction, by estimation, is 60 to 65%. The left ventricle has normal function. The left ventricle has no regional wall motion abnormalities. Left ventricular diastolic parameters were normal.  2. Right ventricular systolic function is normal. The right ventricular size is normal.  3. Left atrial size was mildly dilated.  4. The mitral valve is normal in structure. Trivial mitral valve regurgitation. No evidence of mitral stenosis.  5. The aortic valve is tricuspid. Aortic valve regurgitation is not visualized. No aortic stenosis is present.  6. The inferior vena cava is normal in size with greater than 50% respiratory variability, suggesting right atrial pressure of 3 mmHg. FINDINGS  Left Ventricle: Left ventricular ejection fraction, by estimation, is 60 to 65%. The left ventricle has normal function. The left ventricle has no regional wall motion abnormalities. The left ventricular internal cavity size was normal  in size. There is  no left ventricular hypertrophy. Left ventricular diastolic parameters were normal. Right Ventricle: The right ventricular size is normal. No increase in right ventricular wall thickness. Right ventricular systolic function is normal. Left Atrium: Left atrial size was mildly dilated. Right Atrium: Right atrial size was normal in size. Pericardium: There is no evidence of pericardial effusion. Mitral Valve: The mitral valve is normal in structure. Normal mobility of the mitral valve leaflets. Trivial mitral valve regurgitation. No evidence of mitral valve stenosis. Tricuspid Valve: The tricuspid valve is normal in structure. Tricuspid valve regurgitation is mild . No evidence of tricuspid stenosis. Aortic Valve: The aortic valve is tricuspid. Aortic valve regurgitation  is not visualized. No aortic stenosis is present. Pulmonic Valve: The pulmonic valve was normal in structure. Pulmonic valve regurgitation is not visualized. No evidence of pulmonic stenosis. Aorta: The aortic root is normal in size and structure. Venous: The inferior vena cava is normal in size with greater than 50% respiratory variability, suggesting right atrial pressure of 3 mmHg. IAS/Shunts: No atrial level shunt detected by color flow Doppler. Jenkins Rouge MD Electronically signed by Jenkins Rouge MD Signature Date/Time: 07/05/2019/4:36:37 PM    Final        IMPRESSION/PLAN: 1. Grade 3 IDH mutated anaplastic oligodendroglioma. Dr. Lisbeth Renshaw discusses the pathology findings and reviews the nature of primary brain malignancies and the rationale for chemoRT following resection. We discussed the risks, benefits, short, and long term effects of radiotherapy, and the patient is interested in proceeding. His young age is a consideration as far as risks of secondary malignancies. He is aware of the risk benefit ratio in the decision making for therapy. Dr. Lisbeth Renshaw discusses the delivery and logistics of radiotherapy and anticipates a  course of 6 weeks of radiotherapy.  We will be coordinating his treatment start date along with pharmacy and Dr. Mickeal Skinner to make sure that the patient has his chemotherapy. We anticipate his start date being 08/05/19. He will come in today at 2 pm for simulation at which time he will sign written consent to proceed.  This encounter was provided by telemedicine platform MyChart.  The patient has provided two factor identification and has given verbal consent for this type of encounter and has been advised to only accept a meeting of this type in a secure network environment. The time spent during this encounter was 60 minutes including preparation, discussion, and coordination of the patient's care. The attendants for this meeting include Blenda Nicely, RN, Dr. Lisbeth Renshaw, Hayden Pedro , Piqua L Lupi and Danella Deis. During the encounter,  Blenda Nicely, RN, Dr. Lisbeth Renshaw, and Hayden Pedro were located at [facility] Radiation Oncology Department.  [place] Francisco Mccaughey was located at home with his partner Danella Deis.     The above documentation reflects my direct findings during this shared patient visit. Please see the separate note by Dr. Lisbeth Renshaw on this date for the remainder of the patient's plan of care.    Carola Rhine, PAC

## 2019-07-23 NOTE — Telephone Encounter (Signed)
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  Current medication list in Epic reviewed, no DDIs with temozolomide identified.  Prescription has been e-scribed to the [facility] for benefits analysis and approval.  Oral Oncology Clinic will continue to follow for insurance authorization, copayment issues, initial counseling and start date.  Darl Pikes, PharmD, BCPS, BCOP, CPP Hematology/Oncology Clinical Pharmacist Practitioner [facility] [phone]  07/23/2019 9:13 AM

## 2019-07-23 NOTE — Addendum Note (Signed)
Encounter addended by: Kyung Rudd, MD on: 07/23/2019 10:13 AM  Actions taken: Problem List modified, Visit diagnoses modified

## 2019-07-24 ENCOUNTER — Telehealth: Payer: Self-pay | Admitting: *Deleted

## 2019-07-24 NOTE — Telephone Encounter (Signed)
CSW received referral from [facility] navigator. CSW attempted to reach both patient and significant other. CSW left voicemail requesting return call.  Maryjean Morn, MSW, LCSW, OSW-C Clinical Social Worker [facility] [phone]

## 2019-07-25 ENCOUNTER — Other Ambulatory Visit: Payer: Self-pay | Admitting: Internal Medicine

## 2019-07-25 DIAGNOSIS — C714 Malignant neoplasm of occipital lobe: Secondary | ICD-10-CM

## 2019-07-25 MED ORDER — ONDANSETRON HCL 8 MG PO TABS: 8.0000 mg | ORAL_TABLET | Freq: Two times a day (BID) | ORAL | 1 refills | Status: DC | PRN

## 2019-07-25 MED ORDER — TEMOZOLOMIDE 20 MG PO CAPS: 20.0000 mg | ORAL_CAPSULE | Freq: Every day | ORAL | 0 refills | Status: DC

## 2019-07-25 MED ORDER — TEMOZOLOMIDE 100 MG PO CAPS: 100.0000 mg | ORAL_CAPSULE | Freq: Every day | ORAL | 0 refills | Status: DC

## 2019-07-25 NOTE — Progress Notes (Signed)
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 

## 2019-07-29 NOTE — Telephone Encounter (Signed)
Oral Chemotherapy Pharmacist Encounter   Patient is uninsured for prescription coverage. Using the 340B discount pricing at [facility] for uninsured patients, his Temodar prescription cost would be $272.07 for the 100mg  tablets and $150.70 for the 20mg  tablets.   Attempted to call patient to discuss Temodar cost and potential assistance options on 07/26/19. No answer, LVM for patient to call me back.  On 07/29/19 I reached out to Dr. Mickeal Skinner to let him know about the potential cost issues with Temodar. Also spoke with Ailene Ravel Hobgood in radiation oncology about the patient, she will attempt to reach Mr. Dam to discuss signing up for assistance with [employer] and a cancer center grant.  Darl Pikes, PharmD, BCPS, BCOP, CPP Hematology/Oncology Clinical Pharmacist [facility] [phone]  07/29/2019 9:26 AM

## 2019-07-30 ENCOUNTER — Encounter: Payer: Self-pay | Admitting: Radiation Oncology

## 2019-07-30 ENCOUNTER — Encounter: Payer: Self-pay | Admitting: *Deleted

## 2019-07-30 ENCOUNTER — Inpatient Hospital Stay (HOSPITAL_BASED_OUTPATIENT_CLINIC_OR_DEPARTMENT_OTHER): Payer: Medicaid Other | Admitting: Internal Medicine

## 2019-07-30 ENCOUNTER — Other Ambulatory Visit: Payer: Self-pay

## 2019-07-30 VITALS — BP 116/73 | HR 107 | Temp 98.3°F | Resp 18 | Ht 67.0 in | Wt 140.5 lb

## 2019-07-30 DIAGNOSIS — C714 Malignant neoplasm of occipital lobe: Secondary | ICD-10-CM

## 2019-07-30 DIAGNOSIS — R440 Auditory hallucinations: Secondary | ICD-10-CM

## 2019-07-30 MED ORDER — ARIPIPRAZOLE 5 MG PO TABS: 5.0000 mg | ORAL_TABLET | Freq: Every day | ORAL | 1 refills | Status: DC

## 2019-07-30 NOTE — Progress Notes (Signed)
[facility] [place], [place] [address] [phone]   Interval Evaluation  Date of Service: 07/30/19 Patient Name: Stephen Beard Patient MRN: [id number] Patient DOB: 1995-08-14 Provider: [name], MD  Identifying Statement:  Stephen Beard is a 24 y.o. 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 Zada Finders. Followed by hematoma evacuation on 07/03/19.   07/25/2019 -  Chemotherapy   The patient had [No matching medication found in this treatment plan]  for chemotherapy treatment.      Biomarkers:  MGMT Unknown.  IDH 1/2 Mutated.  EGFR Unknown  1p/19q co-deleted   Interval History:  Stephen Beard presents today for input with recent issues.  He describes continuation of "voice in my head" hallucinations despite initial improvement at the end of his hospitalization following intiation of Lamotrigine.  He describes near daily episodes of hearing the voice, often with negative overtones such as telling him "he will end up in jail".  At this time he has no intent to harm himself or others.  His mother, here with him today, does not feel threatened at this time.  He is anxious about starting radiation, and clearly the hallucinations are become more intrusive in his day to day life in the meantime.    H+P (07/19/19) Patient presented to medical attention with several months of progressive headaches and left sided visual impairment.  This was accompanied by episodes of deja-vu +paroxysmal burning smells which would occur almost daily over that time.  CNS imaging demonstrated large parieto-occipital mass with herniation syndrome.  Mass was resected/debulked, and post-operative course was complicated by intracranial hematoma and also respiratory failure.  At present, he is walking  and talking, carries left sided visual impairment.  Currently on antibiotics for wound infection per Dr. Zada Finders.  Not requiring steroids.  Currently on lamictal 64m daily.  Medications: Current Outpatient Medications on File Prior to Visit  Medication Sig Dispense Refill  . Docusate Sodium (DSS) 100 MG CAPS Take by mouth.    . lamoTRIgine (LAMICTAL) 100 MG tablet Take 1 tablet (100 mg total) by mouth daily. Take 1 tablet 1062mdaily in AM 30 tablet 3  . ondansetron (ZOFRAN) 8 MG tablet Take 1 tablet (8 mg total) by mouth 2 (two) times daily as needed (nausea and vomiting). May take 30-60 minutes prior to Temodar administration if nausea/vomiting occurs. 30 tablet 1  . oxyCODONE-acetaminophen (PERCOCET/ROXICET) 5-325 MG tablet Take by mouth every 4 (four) hours as needed for severe pain.    . Marland Kitchenemozolomide (TEMODAR) 100 MG capsule Take 1 capsule (100 mg total) by mouth daily. May take on an empty stomach to decrease nausea & vomiting. 42 capsule 0  . temozolomide (TEMODAR) 20 MG capsule Take 1 capsule (20 mg total) by mouth daily. May take on an empty stomach to decrease nausea & vomiting. 42 capsule 0   No current facility-administered medications on file prior to visit.    Allergies: No Known Allergies Past Medical History:  Past Medical History:  Diagnosis Date  . ADHD    Past Surgical History:  Past Surgical History:  Procedure Laterality Date  . APPLICATION OF CRANIAL NAVIGATION N/A 07/02/2019   Procedure: APPLICATION OF CRANIAL NAVIGATION;  Surgeon: OsJudith PartMD;  [facility] Service: Neurosurgery;  Laterality: N/A;  . CRANIOTOMY N/A 07/03/2019   Procedure:  CRANIOTOMY HEMATOMA EVACUATION SUBDURAL;  Surgeon: Judith Part, MD;  Location: [facility];  Service: Neurosurgery;  Laterality: N/A;  . CRANIOTOMY Right 07/03/2019   Procedure: CRANIOTOMY HEMATOMA EVACUATION SUBDURAL;  Surgeon: Judith Part, MD;  Location: [facility];  Service: Neurosurgery;  Laterality:  Right;  . CRANIOTOMY Right 07/02/2019   Procedure: CRANIOTOMY TUMOR EXCISION with Lucky Rathke;  Surgeon: Judith Part, MD;  Location: [facility];  Service: Neurosurgery;  Laterality: Right;  posterior   Social History:  Social History   Socioeconomic History  . Marital status: Single    Spouse name: Not on file  . Number of children: Not on file  . Years of education: Not on file  . Highest education level: Not on file  Occupational History  . Not on file  Tobacco Use  . Smoking status: Current Every Day Smoker  . Smokeless tobacco: Current User    Types: Chew  Substance and Sexual Activity  . Alcohol use: Yes  . Drug use: No  . Sexual activity: Not on file  Other Topics Concern  . Not on file  Social History Narrative  . Not on file   Social Determinants of Health   Financial Resource Strain:   . Difficulty of Paying Living Expenses:   Food Insecurity:   . Worried About Charity fundraiser in the Last Year:   . Arboriculturist in the Last Year:   Transportation Needs:   . Film/video editor (Medical):   Marland Kitchen Lack of Transportation (Non-Medical):   Physical Activity:   . Days of Exercise per Week:   . Minutes of Exercise per Session:   Stress:   . Feeling of Stress :   Social Connections:   . Frequency of Communication with Friends and Family:   . Frequency of Social Gatherings with Friends and Family:   . Attends Religious Services:   . Active Member of Clubs or Organizations:   . Attends Archivist Meetings:   Marland Kitchen Marital Status:   Intimate Partner Violence:   . Fear of Current or Ex-Partner:   . Emotionally Abused:   Marland Kitchen Physically Abused:   . Sexually Abused:    Family History: No family history on file.  Review of Systems: Constitutional: Doesn't report fevers, chills or abnormal weight loss Eyes: Doesn't report blurriness of vision Ears, nose, mouth, throat, and face: Doesn't report sore throat Respiratory: Doesn't report cough, dyspnea or  wheezes Cardiovascular: Doesn't report palpitation, chest discomfort  Gastrointestinal:  Doesn't report nausea, constipation, diarrhea GU: Doesn't report incontinence Skin: Doesn't report skin rashes Neurological: Per HPI Musculoskeletal: Doesn't report joint pain Behavioral/Psych: Doesn't report anxiety  Physical Exam: There were no vitals filed for this visit. KPS: 80. General: Alert, cooperative, pleasant, in no acute distress Head: Normal EENT: No conjunctival injection or scleral icterus.  Lungs: Resp effort normal Cardiac: Regular rate Abdomen: Non-distended abdomen Skin: No rashes cyanosis or petechiae. Extremities: No clubbing or edema  Neurologic Exam: Mental Status: Awake, alert, attentive to examiner. Oriented to self and environment. Language is fluent with intact comprehension.  Cranial Nerves: Visual acuity is grossly normal. Left homonymous hemianopia. Extra-ocular movements intact. No ptosis. Face is symmetric Motor: Tone and bulk are normal. Power is full in both arms and legs. Reflexes are symmetric, no pathologic reflexes present.  Sensory: Intact to light touch Gait: Sensory dystaxia   Labs: I have reviewed the data as listed    Component Value Date/Time   NA 137 07/10/2019 0249  K 4.2 07/09/2019 0341   CL 97 (L) 07/09/2019 0341   CO2 28 07/09/2019 0341   GLUCOSE 128 (H) 07/09/2019 0341   BUN 23 (H) 07/09/2019 0341   CREATININE 0.73 07/09/2019 0341   CALCIUM 8.6 (L) 07/09/2019 0341   PROT 5.5 (L) 07/05/2019 0422   ALBUMIN 2.8 (L) 07/09/2019 0341   AST 33 07/05/2019 0422   ALT 18 07/05/2019 0422   ALKPHOS 28 (L) 07/05/2019 0422   BILITOT 0.6 07/05/2019 0422   GFRNONAA >60 07/09/2019 0341   GFRAA >60 07/09/2019 0341   Lab Results  Component Value Date   WBC 18.4 (H) 07/09/2019   NEUTROABS 15.4 (H) 07/08/2019   HGB 10.9 (L) 07/09/2019   HCT 32.2 (L) 07/09/2019   MCV 92.5 07/09/2019   PLT 256 07/09/2019    Imaging:  CT HEAD WO  CONTRAST  Result Date: 07/03/2019 CLINICAL DATA:  Postop hematoma evacuation. EXAM: CT HEAD WITHOUT CONTRAST TECHNIQUE: Contiguous axial images were obtained from the base of the skull through the vertex without intravenous contrast. COMPARISON:  07/03/2019 at 5:37 p.m. FINDINGS: Brain: Sequelae of right parieto-occipital craniotomy for tumor resection are again identified. A hematoma in the medial right parietal operative bed is smaller following interval evacuation, now measuring 4.2 x 3.0 x 2.8 cm (AP x transverse x craniocaudal). Gas is noted along the operative tract in the medial right parieto-occipital region, and there is a new drain in place in this location. There is persistent extensive vasogenic edema in the posterior right cerebral hemisphere. Mass effect from the hematoma has decreased with leftward midline shift now measuring 12 mm (previously 17 mm). The lateral ventricles are slightly smaller than on the prior study. There is a small amount of extra-axial blood along the falx in the right parietal region. There is also minimal extra-axial blood over the posterior right cerebral convexity underneath the craniotomy. There is persistent partial effacement of the basilar cisterns. There is no cerebellar tonsillar herniation. No sizable acute cortically based infarct is identified. Vascular: No hyperdense vessel. Skull: Right parieto-occipital craniotomy with small volume fluid and gas in the scalp. Sinuses/Orbits: Unremarkable orbits. Minimal mucosal thickening in the paranasal sinuses. Clear mastoid air cells. Other: None. IMPRESSION: Decreased size of right parietal hematoma status post evacuation and drain placement. Persistent extensive vasogenic edema in the posterior right cerebral hemisphere with decreased leftward midline shift, now 12 mm. Electronically Signed   By: Logan Bores M.D.   On: 07/03/2019 20:35   CT HEAD WO CONTRAST  Result Date: 07/03/2019 CLINICAL DATA:  Seizure. Abnormal  neurological examination following surgery. Status post tumor resection yesterday and subsequent postoperative hematoma evacuation today. EXAM: CT HEAD WITHOUT CONTRAST TECHNIQUE: Contiguous axial images were obtained from the base of the skull through the vertex without intravenous contrast. COMPARISON:  Head MRI 07/03/2019 FINDINGS: Brain: Postoperative changes are again seen from right parieto-occipital craniotomy. A hyperattenuating hematoma in the medial right parietal operative bed measures 5.3 x 3.6 x 4.3 cm (AP x transverse x craniocaudal), mildly smaller than on today's MRI. Extensive edema in the posterior right cerebrum is similar to the prior MRI, as is leftward midline shift which measures 17 mm. Postoperative gas is noted along the operative tract. The ventricles are unchanged in size with mild asymmetric dilatation of the right temporal horn again noted. No sizable extra-axial fluid collection is present. There remains partial effacement of the basilar cisterns including the suprasellar cistern. There is no cerebellar tonsillar herniation. Vascular: No hyperdense vessel. Skull: Right parieto-occipital  craniotomy with small volume fluid and gas in the scalp soft tissues and with skin staples in place. Sinuses/Orbits: Unremarkable orbits. Minimal mucosal thickening in the paranasal sinuses. Clear mastoid air cells. Other: None. IMPRESSION: Postoperative changes with residual hematoma in the right parietal operative bed, mildly smaller than on today's earlier MRI. Similar degree of edema and mass effect with 17 mm of midline shift. Electronically Signed   By: Logan Bores M.D.   On: 07/03/2019 19:12   CT HEAD WO CONTRAST  Result Date: 07/01/2019 CLINICAL DATA:  Benign neoplasm, brain/CNS volumetric scan for intraop navigation. EXAM: CT HEAD WITHOUT CONTRAST TECHNIQUE: Contiguous axial images were obtained from the base of the skull through the vertex without intravenous contrast. COMPARISON:  Brain  MRI 06/30/2019, head CT 06/30/2019 FINDINGS: Brain: Examination performed for the purposes of intraoperative navigation. Again demonstrated are multiple parenchymal masses centered in the right parietooccipital region. As before, the largest component measures 4.1 cm. Redemonstrated calcification associated with some of these masses. Prominent associated edema within the posterior right cerebral hemisphere and extending into the corpus callosum. Significant partial effacement of the right lateral and third ventricles. Unchanged 10 mm leftward midline shift measured at the level of the septum pellucidum. No interval intracranial abnormality. No extra-axial fluid collection. Vascular: No hyperdense vessel. Skull: Normal. Negative for fracture or focal lesion. Sinuses/Orbits: Visualized orbits demonstrate no acute abnormality. No significant paranasal sinus disease or mastoid effusion at the imaged levels. IMPRESSION: CT imaging performed for the purposes of intraoperative navigation. Redemonstrated are multiple parenchymal masses centered within the right parietooccipital region, some of which have associated calcification. Prominent edema within the posterior right cerebral hemisphere and extending into the corpus callosum. Please refer to recent MRI head 06/30/2019 for favored differential considerations. Mass effect with significant partial effacement of the right lateral and third ventricles. Unchanged 10 mm leftward midline shift. Electronically Signed   By: Kellie Simmering DO   On: 07/01/2019 11:28   CT Head Wo Contrast  Result Date: 06/30/2019 CLINICAL DATA:  Progressive headache, blurred vision on the left. EXAM: CT HEAD WITHOUT CONTRAST TECHNIQUE: Contiguous axial images were obtained from the base of the skull through the vertex without intravenous contrast. COMPARISON:  None. FINDINGS: Brain: Large mass lesions noted in the right occipital lobe and right medial parietal lobe. At least 2 mass lesions appear  to be present measuring up to 4.2 cm and 4.1 cm. There is extensive surrounding vasogenic edema. Mass effect on the lateral ventricles. 21 mm of right to left midline shift. No hydrocephalus. Vascular: No hyperdense vessel or unexpected calcification. Skull: No acute calvarial abnormality. Sinuses/Orbits: Visualized paranasal sinuses and mastoids clear. Orbital soft tissues unremarkable. Other: None IMPRESSION: Large solid mass lesions which are partially calcified noted in the right parietal and occipital lobes with 21 mm of right to left midline shift. Recommend further evaluation with MRI. Electronically Signed   By: Rolm Baptise M.D.   On: 06/30/2019 20:09   MR BRAIN W WO CONTRAST  Result Date: 07/03/2019 CLINICAL DATA:  Follow-up craniotomy. 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 EXAM: MRI HEAD WITHOUT AND WITH CONTRAST TECHNIQUE: Multiplanar, multiecho pulse sequences of the brain and surrounding structures were obtained without and with intravenous contrast. CONTRAST:  29m GADAVIST GADOBUTROL 1 MMOL/ML IV SOLN COMPARISON:  CT and MRI studies of 06/30/2019 and 07/01/2019. FINDINGS: Brain: Interval right parieto-occipital craniotomy for debulking of the large malignant-appearing mass with the epicenter in the deep right medial parietal lobe. The vast majority of the enhancing  tumor has been resected. Residual material in the postoperative space could be a combination of residual nonenhancing tumor and packing material. There does appear to be some indistinct contrast enhancement particularly towards the inferior margin suggesting that some of this represents residual tumor. Surrounding vasogenic white matter edema appears quite similar. Sub falcine herniation appears similar, though right-to-left midline shift is decreased from 17 mm 15 or 16 mm. Slight increase in size of the occipital horn and temporal horn of the right lateral ventricle could be due 2 diminished mass effect from the tumor debulking as well  as some potential for localized trapping. No unexpected subdural collection or hemorrhagic complication. Vascular: Major vessels at the base of the brain show flow. Skull and upper cervical spine: Craniotomy changes as above. Sinuses/Orbits: Clear sinuses. Flattening of the posterior globes possibly related to chronic increased intracranial pressure Other: None IMPRESSION: Recent right parieto-occipital craniotomy for tumor debulking. The vast majority of the enhancing tumor has been resected. Residual material in the operative space could be a combination of poorly enhancing tumor, blood products and packing material. Vasogenic edema appears similar. Mass effect with sub falcine herniation appears similar. Slight increased prominence of the right occipital horn and temporal horn could be due to diminished mass-effect upon those structures or could possibly represent an element of trapping. The changes minor. Electronically Signed   By: Nelson Chimes M.D.   On: 07/03/2019 13:03   MR Brain W and Wo Contrast  Result Date: 07/01/2019 CLINICAL DATA:  Worsening headache for 2 months.  Encephalopathy. EXAM: MRI HEAD WITHOUT AND WITH CONTRAST TECHNIQUE: Multiplanar, multiecho pulse sequences of the brain and surrounding structures were obtained without and with intravenous contrast. CONTRAST:  10m GADAVIST GADOBUTROL 1 MMOL/ML IV SOLN COMPARISON:  Head CT 06/30/2019 FINDINGS: BRAIN: There is a large, heterogeneous mass of the posterior right hemisphere, centered in the right occipital lobe. There is marked mass effect on the right lateral ventricle. Leftward midline shift measures 10 mm. Other enhancing components measure up to 3.1 cm. No contralateral or infratentorial lesion. There is multifocal calcification within the mass. VASCULAR: Major flow voids are preserved. Susceptibility-sensitive sequences show no chronic microhemorrhage or superficial siderosis. SKULL AND UPPER CERVICAL SPINE: Normal calvarium and skull  base. Visualized upper cervical spine and soft tissues are normal. SINUSES/ORBITS: No paranasal sinus fluid levels or advanced mucosal thickening. No mastoid or middle ear effusion. Normal orbits. IMPRESSION: Clustered enhancing masses of the right occipital lobe causing 10 mm of leftward midline shift with mass effect on the lateral ventricles. Differential considerations include oligodendroglioma, glioblastoma multiforme, extraventricular neurocytoma and metastatic disease. Electronically Signed   By: KUlyses JarredM.D.   On: 07/01/2019 00:16   DG Chest Port 1 View  Result Date: 07/07/2019 CLINICAL DATA:  Acute respiratory failure EXAM: PORTABLE CHEST 1 VIEW COMPARISON:  July 04, 2019 FINDINGS: The right basilar infiltrate on the previous study has significantly improved. Peripheral opacities remain in the right lung with worsening in the upper lung. The opacity in the left lung is now in a somewhat peripheral location as well. The overall amount of infiltrate in the left lung is similar although the distribution is changed. Stable left central line. No pneumothorax. Cardiomediastinal silhouette is stable. IMPRESSION: 1. Persistent pulmonary infiltrates. The focal infiltrate in the right base has significantly improved. There is increasing peripheral opacity in the right upper lung. The overall amount of infiltrate on the left is similar although the distribution is changed. The findings are most consistent with an  infectious process. Asymmetric edema considered less likely. Electronically Signed   By: Dorise Bullion III M.D   On: 07/07/2019 11:57   DG CHEST PORT 1 VIEW  Result Date: 07/04/2019 CLINICAL DATA:  Acute respiratory failure with hypoxia. EXAM: PORTABLE CHEST 1 VIEW COMPARISON:  Radiograph yesterday. FINDINGS: Left subclavian central line tip in the mid SVC. Interval extubation and removal of enteric tube. Patchy and heterogeneous bilateral lung opacities in a mid lower lung zone predominant  distribution, with minimal improved aeration of the right lung base from yesterday otherwise unchanged. Unchanged heart size and mediastinal contours. No pneumothorax. IMPRESSION: 1. Interval extubation and removal of enteric tube. 2. Patchy and heterogeneous bilateral lung opacities with minimal improved aeration of the right lung base from yesterday. Electronically Signed   By: Keith Rake M.D.   On: 07/04/2019 20:05   DG CHEST PORT 1 VIEW  Result Date: 07/03/2019 CLINICAL DATA:  Intubated, central line placement EXAM: PORTABLE CHEST 1 VIEW COMPARISON:  None. FINDINGS: Two frontal views of the chest demonstrate endotracheal tube overlying tracheal air column tip at level of thoracic inlet. Enteric catheter passes below diaphragm tip projects over gastric body. Left subclavian catheter tip projects over superior vena cava. Cardiac silhouette is unremarkable. There is bilateral airspace disease, with greatest consolidation in the right middle and right lower lobes. No large effusion or pneumothorax. IMPRESSION: 1. Support devices as above. 2. Multifocal bilateral airspace disease, with dense consolidation at the right base. Electronically Signed   By: Randa Ngo M.D.   On: 07/03/2019 22:44   ECHOCARDIOGRAM COMPLETE  Result Date: 07/05/2019    ECHOCARDIOGRAM REPORT   Patient Name:   Stephen Beard Date of Exam: 07/05/2019 Medical Rec #:  [id number]          Height:       67.0 in Accession #:    [id number]         Weight:       130.1 lb Date of Birth:  11-25-1995          BSA:          1.684 m Patient Age:    23 years           BP:           119/64 mmHg Patient Gender: M                  HR:           78 bpm. Exam Location:  Inpatient Procedure: 2D Echo, Cardiac Doppler and Color Doppler Indications:    Shortness of breath  History:        Patient has no prior history of Echocardiogram examinations.                 Signs/Symptoms:Shortness of Breath. Brain tumor, s/p craniotomy.  Sonographer:     Dustin Flock Referring Phys: [place]  1. Left ventricular ejection fraction, by estimation, is 60 to 65%. The left ventricle has normal function. The left ventricle has no regional wall motion abnormalities. Left ventricular diastolic parameters were normal.  2. Right ventricular systolic function is normal. The right ventricular size is normal.  3. Left atrial size was mildly dilated.  4. The mitral valve is normal in structure. Trivial mitral valve regurgitation. No evidence of mitral stenosis.  5. The aortic valve is tricuspid. Aortic valve regurgitation is not visualized. No aortic stenosis is present.  6. The inferior vena cava is normal in  size with greater than 50% respiratory variability, suggesting right atrial pressure of 3 mmHg. FINDINGS  Left Ventricle: Left ventricular ejection fraction, by estimation, is 60 to 65%. The left ventricle has normal function. The left ventricle has no regional wall motion abnormalities. The left ventricular internal cavity size was normal in size. There is  no left ventricular hypertrophy. Left ventricular diastolic parameters were normal. Right Ventricle: The right ventricular size is normal. No increase in right ventricular wall thickness. Right ventricular systolic function is normal. Left Atrium: Left atrial size was mildly dilated. Right Atrium: Right atrial size was normal in size. Pericardium: There is no evidence of pericardial effusion. Mitral Valve: The mitral valve is normal in structure. Normal mobility of the mitral valve leaflets. Trivial mitral valve regurgitation. No evidence of mitral valve stenosis. Tricuspid Valve: The tricuspid valve is normal in structure. Tricuspid valve regurgitation is mild . No evidence of tricuspid stenosis. Aortic Valve: The aortic valve is tricuspid. Aortic valve regurgitation is not visualized. No aortic stenosis is present. Pulmonic Valve: The pulmonic valve was normal in structure. Pulmonic valve  regurgitation is not visualized. No evidence of pulmonic stenosis. Aorta: The aortic root is normal in size and structure. Venous: The inferior vena cava is normal in size with greater than 50% respiratory variability, suggesting right atrial pressure of 3 mmHg. IAS/Shunts: No atrial level shunt detected by color flow Doppler. 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  It is unclear what relationship this phenomena has with his glioma, as it seems to go back some time, certainly predating other neurologic deficits.  Furthermore, the character and nature of this particular disturbance is not commonly seen in this context.  We recommended consultation and evaluation with pyschiatry as an outpatient.    In the meantime we will initiate therapy with low dose neuroleptic, aripiprazole at 38m daily.  Side effect profile is more favorable than other neuroleptics for brain pathology.    He will follow up with uKoreain ~2 weeks during week 2 of radiation therapy, scheduled to start on 5/24.  We again reviewed the Temozolomide instructions and side effects.  All questions were answered. The patient knows to call the clinic with any problems, questions or concerns. No barriers to learning were detected.  The total time spent in the encounter was 40 minutes and more than 50% was on counseling and review of test results   ZVentura Sellers MD Medical Director of Neuro-Oncology [facility] [place]05/18/21 9:41 AM

## 2019-07-30 NOTE — Progress Notes (Signed)
Met with patient and his mother.  I signed him up for the [place] assistance fund.

## 2019-07-30 NOTE — Progress Notes (Signed)
[place] Work  Clinical Social Work was referred by [facility] navigator for assessment of psychosocial needs.  Clinical Social Worker met with patient and patient's mother  to offer support and assess for needs.  Stephen Beard reported feeling confident in his plan and no significant concerns at this time.  CSW briefly reviewed CSW role and support team resources.   CSW and patient resource specialist, Stephen Beard, met with patient and mother together. Stephen Beard reviewed [place] and discussed patient's Medicaid case.  CSW assisted patient in [employer] application and will send in on patient's behalf.  CSW encouraged patient or support persons to follow up with CSW team as needed for emotional support or access to resources.    Stephen Maine, LCSW  Clinical Social Worker [facility]

## 2019-07-31 ENCOUNTER — Telehealth: Payer: Self-pay | Admitting: Pharmacist

## 2019-07-31 ENCOUNTER — Telehealth: Payer: Self-pay | Admitting: Internal Medicine

## 2019-07-31 MED FILL — TEMOZOLOMIDE 100 MG CAPS: 100 | 42 days supply | Qty: 42 | Fill #0

## 2019-07-31 NOTE — Telephone Encounter (Signed)
Oral Chemotherapy Pharmacist Encounter  I will pick up the Temodar and ondansetron on Monday and deliver them to Stephen Beard when in clinic on Monday 07/31/19. He qualified for the [place] to cover the cost of the medications.  Patient Education I spoke with patient's boyfriend Stephen Beard for overview of new oral chemotherapy medication: Temodar (temozolomide) for the treatment of anaplastic oligodendroglioma in conjunction with RT, planned duration until the end of radiation.   Counseled Stephen Beard on administration, dosing, side effects, monitoring, drug-food interactions, safe handling, storage, and disposal. Patient will take one 100mg  capsule and one 20mg  capsule by mouth daily. May take on an empty stomach to decrease nausea & vomiting.  Side effects include but not limited to: N/V, diarrhea, decreased wbc, fatigue.    Reviewed with Stephen Beard importance of keeping a medication schedule and plan for any missed doses.  Stephen Beard voiced understanding and appreciation. All questions answered. Medication handout will be provided with the medication.  Provided patient with [facility] [facility] phone number. Patient knows to call the office with questions or concerns. Oral Chemotherapy Navigation Clinic will continue to follow.  Darl Pikes, PharmD, BCPS, BCOP, CPP Hematology/Oncology Clinical Pharmacist Practitioner [facility] [phone]  07/31/2019 10:55 AM

## 2019-07-31 NOTE — Telephone Encounter (Signed)
Scheduled apt per 5/18 sch message - unable to reach pt . Left message with appt date and time

## 2019-08-02 DIAGNOSIS — Z510 Encounter for antineoplastic radiation therapy: Secondary | ICD-10-CM | POA: Diagnosis not present

## 2019-08-05 ENCOUNTER — Telehealth: Payer: Self-pay | Admitting: Pharmacist

## 2019-08-05 ENCOUNTER — Ambulatory Visit
Admission: RE | Admit: 2019-08-05 | Discharge: 2019-08-05 | Disposition: A | Discharge status: Home / Self Care | Payer: Medicaid Other | Source: Ambulatory Visit | Attending: Radiation Oncology | Admitting: Radiation Oncology

## 2019-08-05 DIAGNOSIS — Z510 Encounter for antineoplastic radiation therapy: Secondary | ICD-10-CM | POA: Diagnosis not present

## 2019-08-05 NOTE — Telephone Encounter (Signed)
Oral Chemotherapy Pharmacist Encounter   Met patient in the lobby to day to deliver his Temodar and ondansetron. Reviewed with patient how to take medication. Patient also provided with Temodar medication handout.  Darl Pikes, PharmD, BCPS, BCOP, CPP Hematology/Oncology Clinical Pharmacist [facility] [place] [facility] [phone]  08/05/2019 4:18 PM

## 2019-08-06 ENCOUNTER — Ambulatory Visit
Admission: RE | Admit: 2019-08-06 | Discharge: 2019-08-06 | Disposition: A | Discharge status: Home / Self Care | Payer: Medicaid Other | Source: Ambulatory Visit | Attending: Radiation Oncology | Admitting: Radiation Oncology

## 2019-08-06 DIAGNOSIS — Z510 Encounter for antineoplastic radiation therapy: Secondary | ICD-10-CM | POA: Diagnosis not present

## 2019-08-07 ENCOUNTER — Ambulatory Visit
Admission: RE | Admit: 2019-08-07 | Discharge: 2019-08-07 | Disposition: A | Discharge status: Home / Self Care | Payer: Medicaid Other | Source: Ambulatory Visit | Attending: Radiation Oncology | Admitting: Radiation Oncology

## 2019-08-07 ENCOUNTER — Other Ambulatory Visit: Payer: Self-pay

## 2019-08-07 DIAGNOSIS — Z510 Encounter for antineoplastic radiation therapy: Secondary | ICD-10-CM | POA: Diagnosis not present

## 2019-08-08 ENCOUNTER — Ambulatory Visit
Admission: RE | Admit: 2019-08-08 | Discharge: 2019-08-08 | Disposition: A | Discharge status: Home / Self Care | Payer: Medicaid Other | Source: Ambulatory Visit | Attending: Radiation Oncology | Admitting: Radiation Oncology

## 2019-08-08 DIAGNOSIS — Z510 Encounter for antineoplastic radiation therapy: Secondary | ICD-10-CM | POA: Diagnosis not present

## 2019-08-08 NOTE — Progress Notes (Signed)
Pt here for patient teaching.  Pt given Radiation and You booklet, skin care instructions and Sonafine.  Reviewed areas of pertinence such as fatigue, hair loss, nausea and vomiting, skin changes, headache and blurry vision . Pt able to give teach back of to pat skin and drink plenty of water,apply Sonafine bid and avoid applying anything to skin within 4 hours of treatment. Pt verbalizes understanding of information given and will contact nursing with any questions or concerns.     Gloriajean Dell. Leonie Green, BSN

## 2019-08-09 ENCOUNTER — Other Ambulatory Visit: Payer: Self-pay

## 2019-08-09 ENCOUNTER — Ambulatory Visit
Admission: RE | Admit: 2019-08-09 | Discharge: 2019-08-09 | Disposition: A | Discharge status: Home / Self Care | Payer: Medicaid Other | Source: Ambulatory Visit | Attending: Radiation Oncology | Admitting: Radiation Oncology

## 2019-08-09 DIAGNOSIS — Z510 Encounter for antineoplastic radiation therapy: Secondary | ICD-10-CM | POA: Diagnosis not present

## 2019-08-09 DIAGNOSIS — C714 Malignant neoplasm of occipital lobe: Secondary | ICD-10-CM

## 2019-08-09 MED ORDER — SONAFINE EX EMUL
1.0000 "application " | Freq: Two times a day (BID) | CUTANEOUS | Status: DC
  Administered 2019-08-09: 1 via TOPICAL

## 2019-08-13 ENCOUNTER — Ambulatory Visit
Admission: RE | Admit: 2019-08-13 | Discharge: 2019-08-13 | Disposition: A | Discharge status: Home / Self Care | Payer: Medicaid Other | Source: Ambulatory Visit | Attending: Radiation Oncology | Admitting: Radiation Oncology

## 2019-08-13 ENCOUNTER — Other Ambulatory Visit: Payer: Self-pay

## 2019-08-13 DIAGNOSIS — Z510 Encounter for antineoplastic radiation therapy: Secondary | ICD-10-CM | POA: Diagnosis not present

## 2019-08-13 DIAGNOSIS — C719 Malignant neoplasm of brain, unspecified: Secondary | ICD-10-CM | POA: Diagnosis present

## 2019-08-14 ENCOUNTER — Ambulatory Visit
Admission: RE | Admit: 2019-08-14 | Discharge: 2019-08-14 | Disposition: A | Discharge status: Home / Self Care | Payer: Medicaid Other | Source: Ambulatory Visit | Attending: Radiation Oncology | Admitting: Radiation Oncology

## 2019-08-14 ENCOUNTER — Other Ambulatory Visit: Payer: Self-pay

## 2019-08-14 DIAGNOSIS — Z510 Encounter for antineoplastic radiation therapy: Secondary | ICD-10-CM | POA: Diagnosis not present

## 2019-08-15 ENCOUNTER — Other Ambulatory Visit: Payer: Self-pay

## 2019-08-15 ENCOUNTER — Ambulatory Visit
Admission: RE | Admit: 2019-08-15 | Discharge: 2019-08-15 | Disposition: A | Discharge status: Home / Self Care | Payer: Medicaid Other | Source: Ambulatory Visit | Attending: Radiation Oncology | Admitting: Radiation Oncology

## 2019-08-15 DIAGNOSIS — Z510 Encounter for antineoplastic radiation therapy: Secondary | ICD-10-CM | POA: Diagnosis not present

## 2019-08-16 ENCOUNTER — Inpatient Hospital Stay: Payer: Medicaid Other | Admitting: Internal Medicine

## 2019-08-16 ENCOUNTER — Telehealth: Payer: Self-pay | Admitting: Medical Oncology

## 2019-08-16 ENCOUNTER — Inpatient Hospital Stay: Payer: Medicaid Other

## 2019-08-16 ENCOUNTER — Other Ambulatory Visit: Payer: Self-pay

## 2019-08-16 ENCOUNTER — Ambulatory Visit
Admission: RE | Admit: 2019-08-16 | Discharge: 2019-08-16 | Disposition: A | Discharge status: Home / Self Care | Payer: Medicaid Other | Source: Ambulatory Visit | Attending: Radiation Oncology | Admitting: Radiation Oncology

## 2019-08-16 ENCOUNTER — Inpatient Hospital Stay: Payer: Medicaid Other | Attending: Internal Medicine

## 2019-08-16 DIAGNOSIS — C714 Malignant neoplasm of occipital lobe: Secondary | ICD-10-CM | POA: Insufficient documentation

## 2019-08-16 DIAGNOSIS — K5900 Constipation, unspecified: Secondary | ICD-10-CM | POA: Insufficient documentation

## 2019-08-16 DIAGNOSIS — Z510 Encounter for antineoplastic radiation therapy: Secondary | ICD-10-CM | POA: Diagnosis not present

## 2019-08-16 LAB — CBC WITH DIFFERENTIAL (CANCER CENTER ONLY)
Abs Immature Granulocytes: 0.03 10*3/uL (ref 0.00–0.07)
Basophils Absolute: 0 10*3/uL (ref 0.0–0.1)
Basophils Relative: 0 %
Eosinophils Absolute: 0.2 10*3/uL (ref 0.0–0.5)
Eosinophils Relative: 3 %
HCT: 39.2 % (ref 39.0–52.0)
Hemoglobin: 13.1 g/dL (ref 13.0–17.0)
Immature Granulocytes: 0 %
Lymphocytes Relative: 19 %
Lymphs Abs: 1.3 10*3/uL (ref 0.7–4.0)
MCH: 29.6 pg (ref 26.0–34.0)
MCHC: 33.4 g/dL (ref 30.0–36.0)
MCV: 88.5 fL (ref 80.0–100.0)
Monocytes Absolute: 0.6 10*3/uL (ref 0.1–1.0)
Monocytes Relative: 8 %
Neutro Abs: 4.8 10*3/uL (ref 1.7–7.7)
Neutrophils Relative %: 70 %
Platelet Count: 248 10*3/uL (ref 150–400)
RBC: 4.43 MIL/uL (ref 4.22–5.81)
RDW: 12.9 % (ref 11.5–15.5)
WBC Count: 6.9 10*3/uL (ref 4.0–10.5)
nRBC: 0 % (ref 0.0–0.2)

## 2019-08-16 LAB — CMP (CANCER CENTER ONLY)
ALT: 11 U/L (ref 0–44)
AST: 12 U/L — ABNORMAL LOW (ref 15–41)
Albumin: 4 g/dL (ref 3.5–5.0)
Alkaline Phosphatase: 75 U/L (ref 38–126)
Anion gap: 10 (ref 5–15)
BUN: 10 mg/dL (ref 6–20)
CO2: 17 mmol/L — ABNORMAL LOW (ref 22–32)
Calcium: 9 mg/dL (ref 8.9–10.3)
Chloride: 110 mmol/L (ref 98–111)
Creatinine: 1.02 mg/dL (ref 0.61–1.24)
GFR, Est AFR Am: >60 mL/min (ref >60)
GFR, Estimated: >60 mL/min (ref >60)
Glucose, Bld: 101 mg/dL — ABNORMAL HIGH (ref 70–99)
Potassium: 3.8 mmol/L (ref 3.5–5.1)
Sodium: 137 mmol/L (ref 135–145)
Total Bilirubin: 0.5 mg/dL (ref 0.3–1.2)
Total Protein: 7.2 g/dL (ref 6.5–8.1)

## 2019-08-16 NOTE — Telephone Encounter (Signed)
Pt contacted and he is on his way to xrt and will get labs then see Vaslow after xrt.

## 2019-08-16 NOTE — Telephone Encounter (Signed)
LVM for pt to return my call.

## 2019-08-16 NOTE — Telephone Encounter (Signed)
[place] notified to tell pt that he will be r/s next week to see Dr Mickeal Skinner.

## 2019-08-19 ENCOUNTER — Ambulatory Visit
Admission: RE | Admit: 2019-08-19 | Discharge: 2019-08-19 | Disposition: A | Discharge status: Home / Self Care | Payer: Medicaid Other | Source: Ambulatory Visit | Attending: Radiation Oncology | Admitting: Radiation Oncology

## 2019-08-19 ENCOUNTER — Other Ambulatory Visit: Payer: Self-pay

## 2019-08-19 ENCOUNTER — Telehealth: Payer: Self-pay | Admitting: Internal Medicine

## 2019-08-19 DIAGNOSIS — Z510 Encounter for antineoplastic radiation therapy: Secondary | ICD-10-CM | POA: Diagnosis not present

## 2019-08-19 NOTE — Telephone Encounter (Signed)
Scheduled appt per 6/7 sch message - unable to reach pt . Left message with appt date and time

## 2019-08-20 ENCOUNTER — Ambulatory Visit
Admission: RE | Admit: 2019-08-20 | Discharge: 2019-08-20 | Disposition: A | Discharge status: Home / Self Care | Payer: Medicaid Other | Source: Ambulatory Visit | Attending: Radiation Oncology | Admitting: Radiation Oncology

## 2019-08-20 ENCOUNTER — Inpatient Hospital Stay (HOSPITAL_BASED_OUTPATIENT_CLINIC_OR_DEPARTMENT_OTHER): Payer: Medicaid Other | Admitting: Internal Medicine

## 2019-08-20 ENCOUNTER — Other Ambulatory Visit: Payer: Self-pay

## 2019-08-20 ENCOUNTER — Inpatient Hospital Stay: Payer: Medicaid Other

## 2019-08-20 VITALS — BP 118/78 | HR 95 | Temp 98.1°F | Resp 18 | Wt 138.8 lb

## 2019-08-20 DIAGNOSIS — M5432 Sciatica, left side: Secondary | ICD-10-CM | POA: Diagnosis not present

## 2019-08-20 DIAGNOSIS — Z510 Encounter for antineoplastic radiation therapy: Secondary | ICD-10-CM | POA: Diagnosis not present

## 2019-08-20 DIAGNOSIS — C714 Malignant neoplasm of occipital lobe: Secondary | ICD-10-CM | POA: Diagnosis not present

## 2019-08-20 MED ORDER — LAMOTRIGINE 100 MG PO TABS: 100.0000 mg | ORAL_TABLET | Freq: Every day | ORAL | 3 refills | Status: DC

## 2019-08-20 MED ORDER — SENNA 8.6 MG PO TABS
1.0000 | ORAL_TABLET | Freq: Every day | ORAL | 2 refills | Status: DC
Start: 2019-08-20 — End: 2019-12-12

## 2019-08-20 NOTE — Progress Notes (Signed)
[facility] at [place], [place] [address] [phone]   Interval Evaluation  Date of Service: 08/20/19 Patient Name: Stephen Beard Patient MRN: [id number] Patient DOB: September 20, 1995 Provider: [name], MD  Identifying Statement:  Stephen Beard is a 24 y.o. 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 Zada Finders. Followed by hematoma evacuation on 07/03/19.   07/25/2019 -  Chemotherapy   The patient had [No matching medication found in this treatment plan]  for chemotherapy treatment.      Biomarkers:  MGMT Unknown.  IDH 1/2 Mutated.  EGFR Unknown  1p/19q co-deleted   Interval History:  Stephen Beard presents today for follow up after completing 2 weeks of IMRT and concurrent Temodar.  He describes no recurrence of seizures aside from occassional auras.  No headaches.  He does describe worsening of pain in lower back radiating down back of left upper leg.  This limits his ability to move around and has become increasingly limiting.  Also complains of constipation since starting the temodar.  Fatigue has been modest during radiation thus far.  H+P (07/19/19) Patient presented to medical attention with several months of progressive headaches and left sided visual impairment.  This was accompanied by episodes of deja-vu +paroxysmal burning smells which would occur almost daily over that time.  CNS imaging demonstrated large parieto-occipital mass with herniation syndrome.  Mass was resected/debulked, and post-operative course was complicated by intracranial hematoma and also respiratory failure.  At present, he is walking and talking, carries left sided visual impairment.  Currently on antibiotics for wound infection per Dr. Zada Finders.  Not requiring steroids.  Currently on  lamictal 62m daily.  Medications: Current Outpatient Medications on File Prior to Visit  Medication Sig Dispense Refill  . ARIPiprazole (ABILIFY) 5 MG tablet Take 1 tablet (5 mg total) by mouth daily. 30 tablet 1  . Docusate Sodium (DSS) 100 MG CAPS Take by mouth.    . lamoTRIgine (LAMICTAL) 100 MG tablet Take 1 tablet (100 mg total) by mouth daily. Take 1 tablet 1053mdaily in AM 30 tablet 3  . ondansetron (ZOFRAN) 8 MG tablet Take 1 tablet (8 mg total) by mouth 2 (two) times daily as needed (nausea and vomiting). May take 30-60 minutes prior to Temodar administration if nausea/vomiting occurs. (Patient not taking: Reported on 07/30/2019) 30 tablet 1  . oxyCODONE-acetaminophen (PERCOCET/ROXICET) 5-325 MG tablet Take by mouth every 4 (four) hours as needed for severe pain.    . Marland Kitchenemozolomide (TEMODAR) 100 MG capsule Take 1 capsule (100 mg total) by mouth daily. May take on an empty stomach to decrease nausea & vomiting. (Patient not taking: Reported on 07/30/2019) 42 capsule 0  . temozolomide (TEMODAR) 20 MG capsule Take 1 capsule (20 mg total) by mouth daily. May take on an empty stomach to decrease nausea & vomiting. (Patient not taking: Reported on 07/30/2019) 42 capsule 0   No current facility-administered medications on file prior to visit.    Allergies: No Known Allergies Past Medical History:  Past Medical History:  Diagnosis Date  . ADHD    Past Surgical History:  Past Surgical History:  Procedure Laterality Date  . APPLICATION OF CRANIAL NAVIGATION N/A 07/02/2019   Procedure: APPLICATION OF CRANIAL NAVIGATION;  Surgeon: OsJudith PartMD;  [place] Service: Neurosurgery;  Laterality: N/A;  .  CRANIOTOMY N/A 07/03/2019   Procedure: CRANIOTOMY HEMATOMA EVACUATION SUBDURAL;  Surgeon: Judith Part, MD;  Location: [place];  Service: Neurosurgery;  Laterality: N/A;  . CRANIOTOMY Right 07/03/2019   Procedure: CRANIOTOMY HEMATOMA EVACUATION SUBDURAL;  Surgeon: Judith Part, MD;  Location: [place];  Service: Neurosurgery;  Laterality: Right;  . CRANIOTOMY Right 07/02/2019   Procedure: CRANIOTOMY TUMOR EXCISION with Lucky Rathke;  Surgeon: Judith Part, MD;  Location: [place];  Service: Neurosurgery;  Laterality: Right;  posterior   Social History:  Social History   Socioeconomic History  . Marital status: Single    Spouse name: Not on file  . Number of children: Not on file  . Years of education: Not on file  . Highest education level: Not on file  Occupational History  . Not on file  Tobacco Use  . Smoking status: Current Every Day Smoker  . Smokeless tobacco: Current User    Types: Chew  Substance and Sexual Activity  . Alcohol use: Yes  . Drug use: No  . Sexual activity: Not on file  Other Topics Concern  . Not on file  Social History Narrative  . Not on file   Social Determinants of Health   Financial Resource Strain:   . Difficulty of Paying Living Expenses:   Food Insecurity:   . Worried About Charity fundraiser in the Last Year:   . Arboriculturist in the Last Year:   Transportation Needs:   . Film/video editor (Medical):   Marland Kitchen Lack of Transportation (Non-Medical):   Physical Activity:   . Days of Exercise per Week:   . Minutes of Exercise per Session:   Stress:   . Feeling of Stress :   Social Connections:   . Frequency of Communication with Friends and Family:   . Frequency of Social Gatherings with Friends and Family:   . Attends Religious Services:   . Active Member of Clubs or Organizations:   . Attends Archivist Meetings:   Marland Kitchen Marital Status:   Intimate Partner Violence:   . Fear of Current or Ex-Partner:   . Emotionally Abused:   Marland Kitchen Physically Abused:   . Sexually Abused:    Family History: No family history on file.  Review of Systems: Constitutional: Doesn't report fevers, chills or abnormal weight loss Eyes: Doesn't report blurriness of vision Ears, nose, mouth, throat, and face: Doesn't  report sore throat Respiratory: Doesn't report cough, dyspnea or wheezes Cardiovascular: Doesn't report palpitation, chest discomfort  Gastrointestinal:  Doesn't report nausea, constipation, diarrhea GU: Doesn't report incontinence Skin: Doesn't report skin rashes Neurological: Per HPI Musculoskeletal: Doesn't report joint pain Behavioral/Psych: Doesn't report anxiety  Physical Exam: Vitals:   08/20/19 1531  BP: 118/78  Pulse: 95  Resp: 18  Temp: 98.1 F (36.7 C)  SpO2: 100%   KPS: 80. General: Alert, cooperative, pleasant, in no acute distress Head: Normal EENT: No conjunctival injection or scleral icterus.  Lungs: Resp effort normal Cardiac: Regular rate Abdomen: Non-distended abdomen Skin: No rashes cyanosis or petechiae. Extremities: No clubbing or edema  Neurologic Exam: Mental Status: Awake, alert, attentive to examiner. Oriented to self and environment. Language is fluent with intact comprehension.  Cranial Nerves: Visual acuity is grossly normal. Left homonymous hemianopia. Extra-ocular movements intact. No ptosis. Face is symmetric Motor: Tone and bulk are normal. Power is full in both arms and legs. Reflexes are symmetric, no pathologic reflexes present.  Sensory: Intact to light touch Gait: Sensory  dystaxia   Labs: I have reviewed the data as listed    Component Value Date/Time   NA 137 08/16/2019 1353   K 3.8 08/16/2019 1353   CL 110 08/16/2019 1353   CO2 17 (L) 08/16/2019 1353   GLUCOSE 101 (H) 08/16/2019 1353   BUN 10 08/16/2019 1353   CREATININE 1.02 08/16/2019 1353   CALCIUM 9.0 08/16/2019 1353   PROT 7.2 08/16/2019 1353   ALBUMIN 4.0 08/16/2019 1353   AST 12 (L) 08/16/2019 1353   ALT 11 08/16/2019 1353   ALKPHOS 75 08/16/2019 1353   BILITOT 0.5 08/16/2019 1353   GFRNONAA >60 08/16/2019 1353   GFRAA >60 08/16/2019 1353   Lab Results  Component Value Date   WBC 6.9 08/16/2019   NEUTROABS 4.8 08/16/2019   HGB 13.1 08/16/2019   HCT 39.2  08/16/2019   MCV 88.5 08/16/2019   PLT 248 08/16/2019     Assessment/Plan 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HCC) [C71.4]   Stephen Beard is clinically stable today, now having completed 2 weeks of radiation and chemotherapy.  We recommended continuing treatment with IMRT and concurrent Temozolomide 63m/m2 daily, as prescribed for 6 weeks.  Chemotherapy should be held for the following:  ANC less than 1,000  Platelets less than 100,000  LFT or creatinine greater than 2x ULN  If clinical concerns/contraindications develop   Will con't aripiprazole at 561mdaily and encouraged follow up with pyschiatry referral for hallucinations.  Will start colace and daily senna tablets for constipation, with miralax as needed.  For sciatica pain with gait compromise, we recommended MRI of the lumbar spine with contrast enhancement due to concurrent CNS neoplasm.  He will follow up with usKorean ~2 weeks during week 4 of radiation therapy.  All questions were answered. The patient knows to call the clinic with any problems, questions or concerns. No barriers to learning were detected.  The total time spent in the encounter was 30 minutes and more than 50% was on counseling and review of test results   ZaVentura SellersMD Medical Director of Neuro-Oncology [facility]6/08/21 3:30 PM

## 2019-08-21 ENCOUNTER — Other Ambulatory Visit: Payer: Self-pay | Admitting: *Deleted

## 2019-08-21 ENCOUNTER — Ambulatory Visit
Admission: RE | Admit: 2019-08-21 | Discharge: 2019-08-21 | Disposition: A | Discharge status: Home / Self Care | Payer: Medicaid Other | Source: Ambulatory Visit | Attending: Radiation Oncology | Admitting: Radiation Oncology

## 2019-08-21 ENCOUNTER — Other Ambulatory Visit: Payer: Self-pay

## 2019-08-21 DIAGNOSIS — M5432 Sciatica, left side: Secondary | ICD-10-CM

## 2019-08-21 DIAGNOSIS — Z510 Encounter for antineoplastic radiation therapy: Secondary | ICD-10-CM | POA: Diagnosis not present

## 2019-08-21 DIAGNOSIS — C714 Malignant neoplasm of occipital lobe: Secondary | ICD-10-CM

## 2019-08-22 ENCOUNTER — Other Ambulatory Visit: Payer: Self-pay

## 2019-08-22 ENCOUNTER — Ambulatory Visit
Admission: RE | Admit: 2019-08-22 | Discharge: 2019-08-22 | Disposition: A | Discharge status: Home / Self Care | Payer: Medicaid Other | Source: Ambulatory Visit | Attending: Radiation Oncology | Admitting: Radiation Oncology

## 2019-08-22 DIAGNOSIS — Z510 Encounter for antineoplastic radiation therapy: Secondary | ICD-10-CM | POA: Diagnosis not present

## 2019-08-23 ENCOUNTER — Ambulatory Visit
Admission: RE | Admit: 2019-08-23 | Discharge: 2019-08-23 | Disposition: A | Discharge status: Home / Self Care | Payer: Medicaid Other | Source: Ambulatory Visit | Attending: Radiation Oncology | Admitting: Radiation Oncology

## 2019-08-23 ENCOUNTER — Other Ambulatory Visit: Payer: Self-pay

## 2019-08-23 DIAGNOSIS — Z510 Encounter for antineoplastic radiation therapy: Secondary | ICD-10-CM | POA: Diagnosis not present

## 2019-08-26 ENCOUNTER — Ambulatory Visit
Admission: RE | Admit: 2019-08-26 | Discharge: 2019-08-26 | Disposition: A | Discharge status: Home / Self Care | Payer: Medicaid Other | Source: Ambulatory Visit | Attending: Radiation Oncology | Admitting: Radiation Oncology

## 2019-08-26 ENCOUNTER — Other Ambulatory Visit: Payer: Self-pay

## 2019-08-26 DIAGNOSIS — Z510 Encounter for antineoplastic radiation therapy: Secondary | ICD-10-CM | POA: Diagnosis not present

## 2019-08-27 ENCOUNTER — Ambulatory Visit
Admission: RE | Admit: 2019-08-27 | Discharge: 2019-08-27 | Disposition: A | Discharge status: Home / Self Care | Payer: Medicaid Other | Source: Ambulatory Visit | Attending: Radiation Oncology | Admitting: Radiation Oncology

## 2019-08-27 ENCOUNTER — Inpatient Hospital Stay: Payer: Medicaid Other | Admitting: Internal Medicine

## 2019-08-27 ENCOUNTER — Other Ambulatory Visit: Payer: Self-pay

## 2019-08-27 ENCOUNTER — Other Ambulatory Visit: Payer: Self-pay | Admitting: Radiation Oncology

## 2019-08-27 ENCOUNTER — Inpatient Hospital Stay: Payer: Medicaid Other

## 2019-08-27 DIAGNOSIS — C714 Malignant neoplasm of occipital lobe: Secondary | ICD-10-CM

## 2019-08-27 DIAGNOSIS — Z510 Encounter for antineoplastic radiation therapy: Secondary | ICD-10-CM | POA: Diagnosis not present

## 2019-08-27 LAB — CMP (CANCER CENTER ONLY)
ALT: 14 U/L (ref 0–44)
AST: 14 U/L — ABNORMAL LOW (ref 15–41)
Albumin: 4.4 g/dL (ref 3.5–5.0)
Alkaline Phosphatase: 80 U/L (ref 38–126)
Anion gap: 11 (ref 5–15)
BUN: 10 mg/dL (ref 6–20)
CO2: 23 mmol/L (ref 22–32)
Calcium: 9.3 mg/dL (ref 8.9–10.3)
Chloride: 108 mmol/L (ref 98–111)
Creatinine: 0.92 mg/dL (ref 0.61–1.24)
GFR, Est AFR Am: >60 mL/min (ref >60)
GFR, Estimated: >60 mL/min (ref >60)
Glucose, Bld: 114 mg/dL — ABNORMAL HIGH (ref 70–99)
Potassium: 3.3 mmol/L — ABNORMAL LOW (ref 3.5–5.1)
Sodium: 142 mmol/L (ref 135–145)
Total Bilirubin: 0.6 mg/dL (ref 0.3–1.2)
Total Protein: 7.3 g/dL (ref 6.5–8.1)

## 2019-08-27 LAB — CBC WITH DIFFERENTIAL (CANCER CENTER ONLY)
Abs Immature Granulocytes: 0.03 10*3/uL (ref 0.00–0.07)
Basophils Absolute: 0 10*3/uL (ref 0.0–0.1)
Basophils Relative: 0 %
Eosinophils Absolute: 0.2 10*3/uL (ref 0.0–0.5)
Eosinophils Relative: 3 %
HCT: 42.3 % (ref 39.0–52.0)
Hemoglobin: 14.1 g/dL (ref 13.0–17.0)
Immature Granulocytes: 0 %
Lymphocytes Relative: 13 %
Lymphs Abs: 1 10*3/uL (ref 0.7–4.0)
MCH: 29.4 pg (ref 26.0–34.0)
MCHC: 33.3 g/dL (ref 30.0–36.0)
MCV: 88.1 fL (ref 80.0–100.0)
Monocytes Absolute: 0.6 10*3/uL (ref 0.1–1.0)
Monocytes Relative: 8 %
Neutro Abs: 6 10*3/uL (ref 1.7–7.7)
Neutrophils Relative %: 76 %
Platelet Count: 241 10*3/uL (ref 150–400)
RBC: 4.8 MIL/uL (ref 4.22–5.81)
RDW: 14 % (ref 11.5–15.5)
WBC Count: 7.9 10*3/uL (ref 4.0–10.5)
nRBC: 0 % (ref 0.0–0.2)

## 2019-08-27 MED ORDER — ONDANSETRON HCL 8 MG PO TABS: 8.0000 mg | ORAL_TABLET | Freq: Two times a day (BID) | ORAL | 1 refills | Status: DC | PRN

## 2019-08-27 NOTE — Progress Notes (Signed)
Refilled nausea meds per nursing request.

## 2019-08-28 ENCOUNTER — Ambulatory Visit
Admission: RE | Admit: 2019-08-28 | Discharge: 2019-08-28 | Disposition: A | Discharge status: Home / Self Care | Payer: Medicaid Other | Source: Ambulatory Visit | Attending: Radiation Oncology | Admitting: Radiation Oncology

## 2019-08-28 ENCOUNTER — Other Ambulatory Visit: Payer: Self-pay

## 2019-08-28 DIAGNOSIS — Z510 Encounter for antineoplastic radiation therapy: Secondary | ICD-10-CM | POA: Diagnosis not present

## 2019-08-29 ENCOUNTER — Ambulatory Visit
Admission: RE | Admit: 2019-08-29 | Discharge: 2019-08-29 | Disposition: A | Discharge status: Home / Self Care | Payer: Medicaid Other | Source: Ambulatory Visit | Attending: Radiation Oncology | Admitting: Radiation Oncology

## 2019-08-29 ENCOUNTER — Other Ambulatory Visit: Payer: Self-pay | Admitting: *Deleted

## 2019-08-29 ENCOUNTER — Telehealth: Payer: Self-pay | Admitting: *Deleted

## 2019-08-29 ENCOUNTER — Other Ambulatory Visit: Payer: Self-pay

## 2019-08-29 DIAGNOSIS — Z510 Encounter for antineoplastic radiation therapy: Secondary | ICD-10-CM | POA: Diagnosis not present

## 2019-08-29 DIAGNOSIS — M5430 Sciatica, unspecified side: Secondary | ICD-10-CM

## 2019-08-29 NOTE — Telephone Encounter (Signed)
Patient previously requested pain medication for leg pain.  During his radiation visit today he asked for a muscle relaxer for his leg pain.  Notified [place] [facility] that we attempted several times to reach patient via phone, left 2 messages with no call back.  Patient needs to have ASAP scan of lumbar area, order was changed from [place] to [place] to have available dates sooner.  [place] gave scheduling number to patient directly to have him call to schedule since we cannot get in touch with him via phone.  MD not in office and any further assistance would need to be evaluated by [place] in his absence.

## 2019-08-29 NOTE — Telephone Encounter (Signed)
See addendum

## 2019-08-29 NOTE — Telephone Encounter (Signed)
Attempted to reach patient to instruct to reach out to PCP in regards to leg pain as this doesn't pertain to brain tumor issue.  Left message, pending call back.  FYI. To MD    ----- Message -----  From: Cori Razor, RN  Sent: 08/28/2019  4:03 PM EDT  To: Raynelle Bring, LPN  Subject: RE: needs pain meds                I spoke with the PA and she does not feel comfortable prescribing medications without a clear picture of what's going on. She would be more inclined if his scans were done.   Thanks,   Carolee Rota  ----- Message -----  From: Raynelle Bring, LPN  Sent: 9/38/1017  2:38 PM EDT  To: Cori Razor, RN  Subject: needs pain meds                  Pt called asking for pain meds due to increased leg pain unable to get relief from ibuprofen/ Dr. Mickeal Skinner is on PAL pt has been on percocet 325 in past can dr moody fill this   thank you

## 2019-08-30 ENCOUNTER — Other Ambulatory Visit: Payer: Self-pay

## 2019-08-30 ENCOUNTER — Ambulatory Visit
Admission: RE | Admit: 2019-08-30 | Discharge: 2019-08-30 | Disposition: A | Discharge status: Home / Self Care | Payer: Medicaid Other | Source: Ambulatory Visit | Attending: Radiation Oncology | Admitting: Radiation Oncology

## 2019-08-30 DIAGNOSIS — Z510 Encounter for antineoplastic radiation therapy: Secondary | ICD-10-CM | POA: Diagnosis not present

## 2019-08-31 ENCOUNTER — Ambulatory Visit (INDEPENDENT_AMBULATORY_CARE_PROVIDER_SITE_OTHER): Payer: Medicaid Other

## 2019-08-31 DIAGNOSIS — M5430 Sciatica, unspecified side: Secondary | ICD-10-CM

## 2019-08-31 IMAGING — MR MR LUMBAR SPINE W/O CM
4 of 5 series · 26 of 48 positions shown · non-contrast
Comparison: None.

CLINICAL DATA: Low back pain.  Bilateral leg pain.

EXAM:
MRI LUMBAR SPINE WITHOUT CONTRAST
TECHNIQUE: Multiplanar, multisequence MR imaging of the lumbar spine was
performed. No intravenous contrast was administered.

[Series 2: T2 · sagittal · 4.0mm · 0.81mm/px · 6 of 15 slices shown (1 of 2)]
[im 1/15]
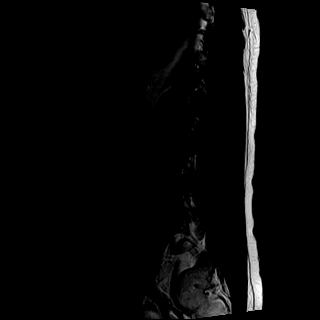
[im 3/15]
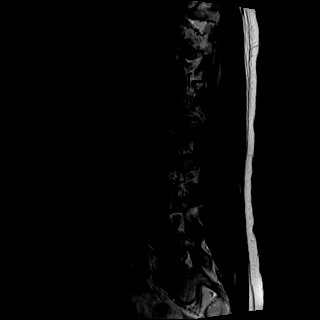
[im 6/15]
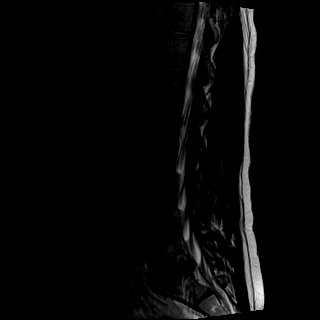
[im 9/15]
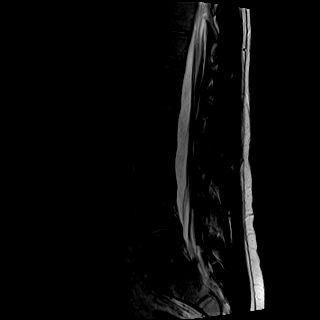
[im 12/15]
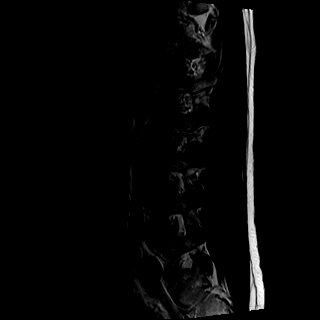
[im 15/15]
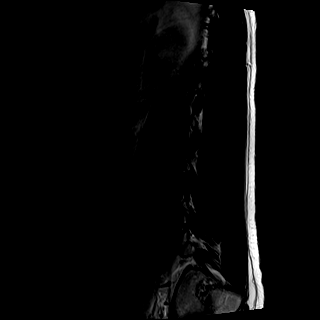

[Series 3: T1 · sagittal · 4.0mm · 0.41mm/px · 6 of 15 slices shown (1 of 2)]
[im 1/15]
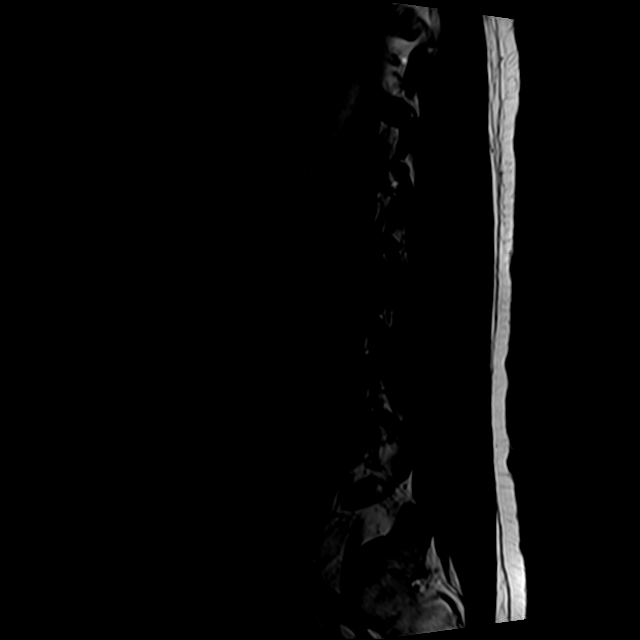
[im 3/15]
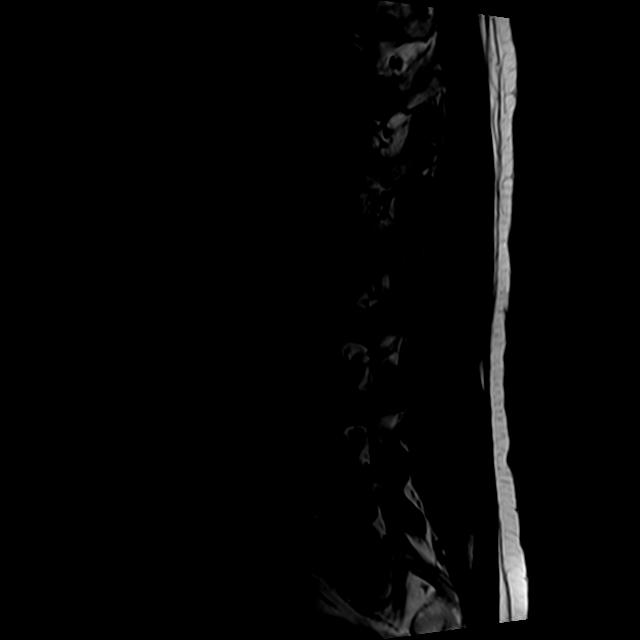
[im 6/15]
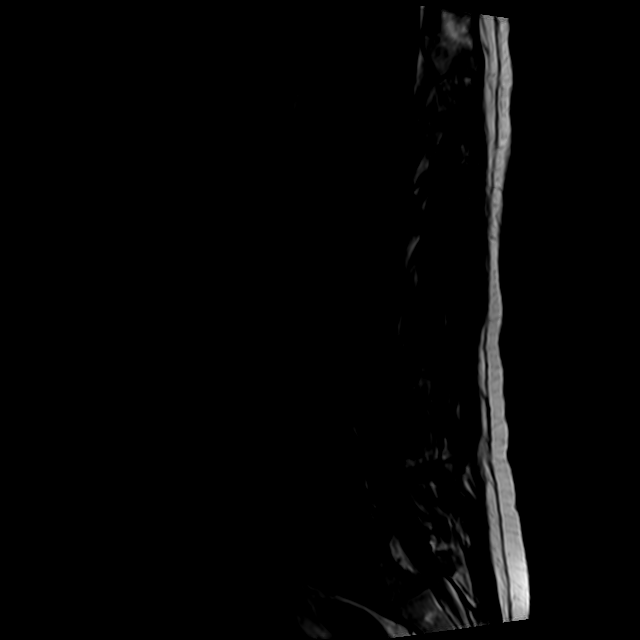
[im 9/15]
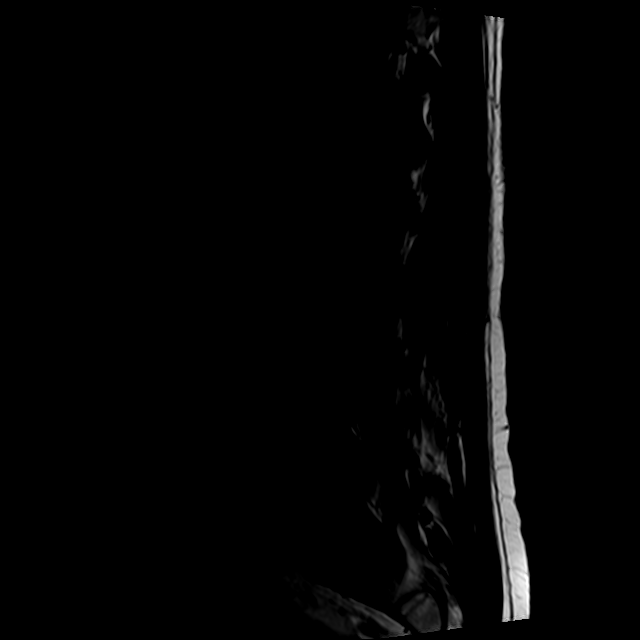
[im 12/15]
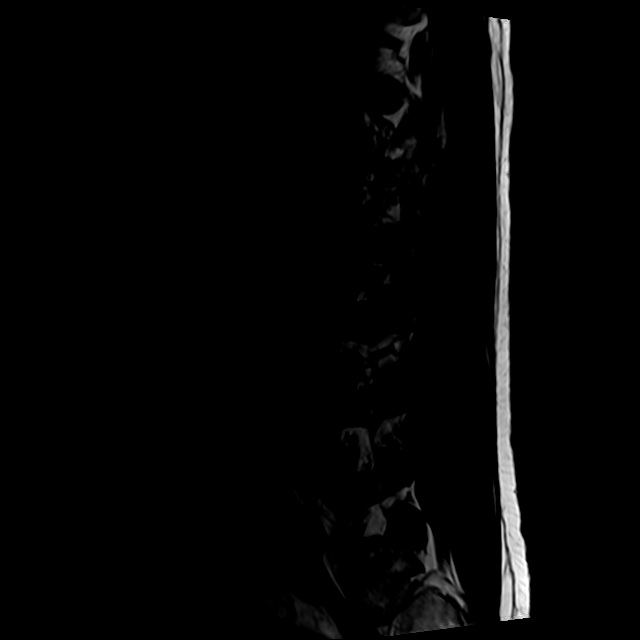
[im 15/15]
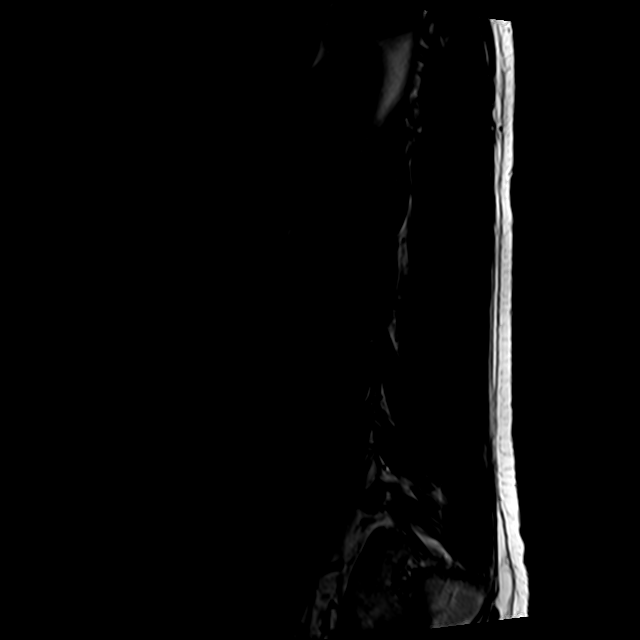

[Series 5: T2 · axial · 4.0mm · 0.78mm/px · z∈[-73,+138]mm · 9 of 39 slices shown (2 of 2)]
[im 1/39]
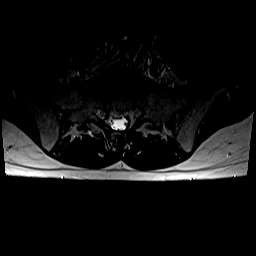
[im 6/39]
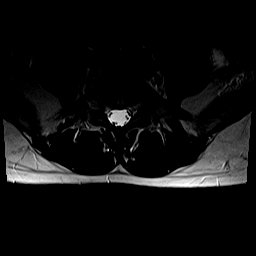
[im 11/39]
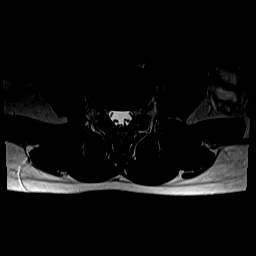
[im 17/39]
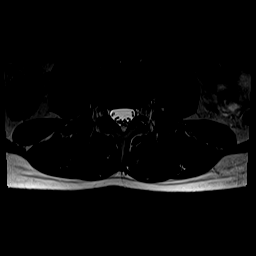
[im 20/39]
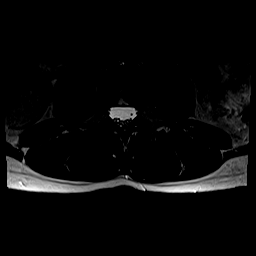
[im 22/39]
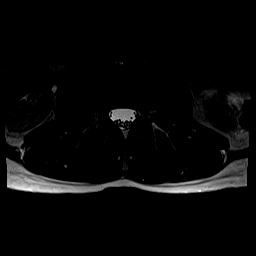
[im 28/39]
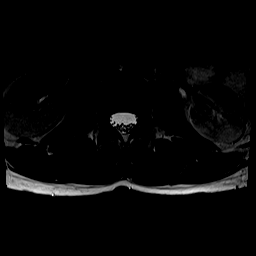
[im 33/39]
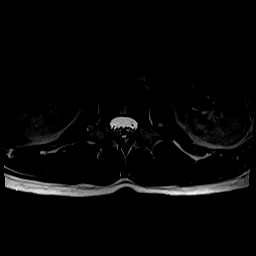
[im 39/39]
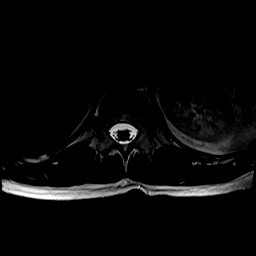

[Series 6: T1 · axial · 4.0mm · 0.39mm/px · z∈[-73,+108]mm · 5 of 39 slices shown (2 of 2)]
[im 1/39]
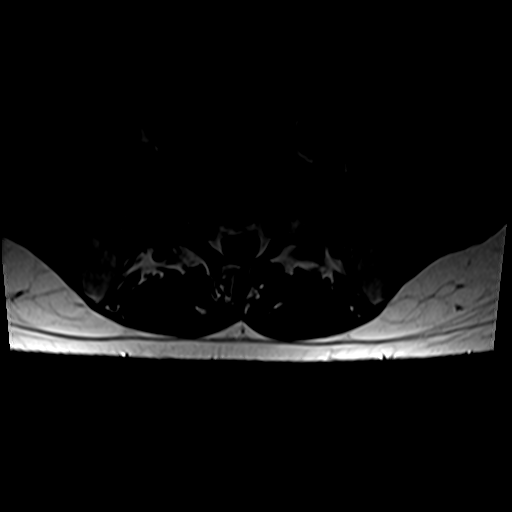
[im 6/39]
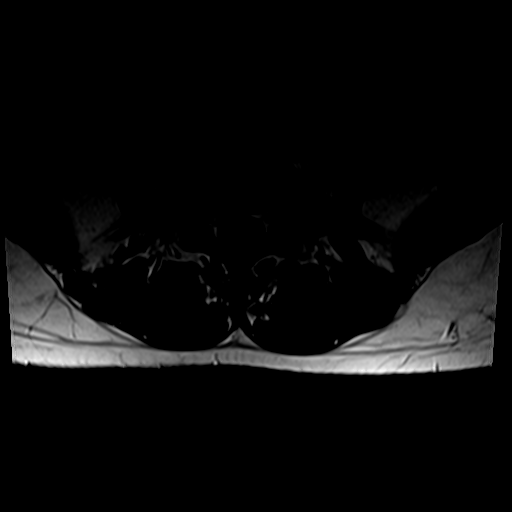
[im 11/39]
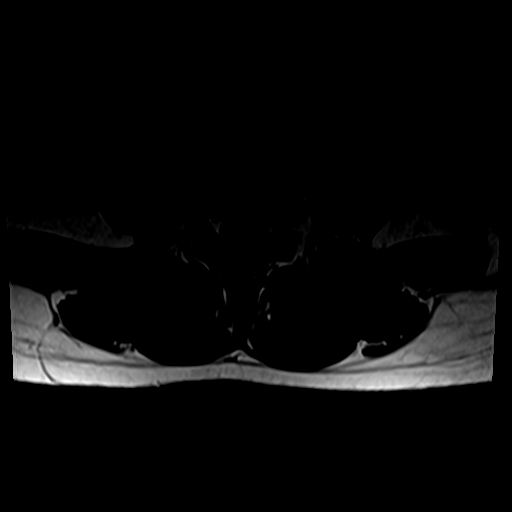
[im 20/39]
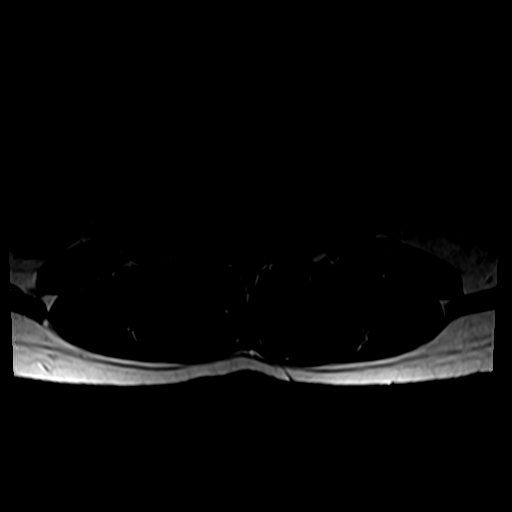
[im 33/39]
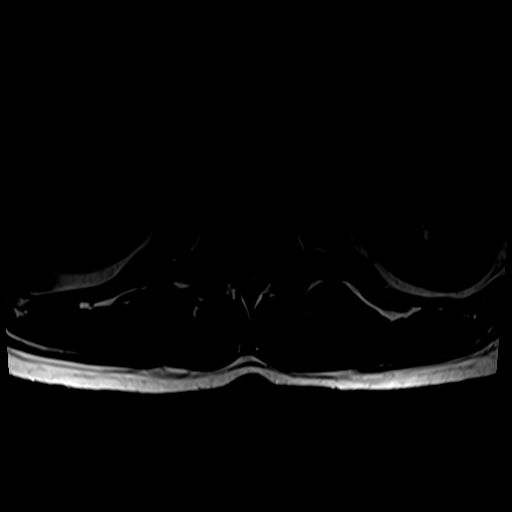

[26 of 48 positions shown; findings below may reference images not displayed]

FINDINGS: Segmentation:  5 lumbar type vertebra.

Alignment:  Physiologic.

Vertebrae: No fracture, evidence of discitis, or bone lesion. No
spinal or foraminal stenoses.

Conus medullaris and cauda equina: Conus extends to the L1-2 level.
Conus and cauda equina appear normal.

Paraspinal and other soft tissues: Normal.

Disc levels:

T11-12 through L4-5: Normal.

L5-S1: Disc desiccation. Tiny central subligamentous disc protrusion
without neural impingement. Otherwise normal.
IMPRESSION: 1. Tiny central subligamentous disc protrusion at L5-S1 without
neural impingement.
2. Otherwise, normal MRI of the lumbar spine.

## 2019-09-02 ENCOUNTER — Other Ambulatory Visit: Payer: Self-pay | Admitting: Internal Medicine

## 2019-09-02 ENCOUNTER — Ambulatory Visit
Admission: RE | Admit: 2019-09-02 | Discharge: 2019-09-02 | Disposition: A | Discharge status: Home / Self Care | Payer: Medicaid Other | Source: Ambulatory Visit | Attending: Radiation Oncology | Admitting: Radiation Oncology

## 2019-09-02 ENCOUNTER — Other Ambulatory Visit: Payer: Self-pay

## 2019-09-02 DIAGNOSIS — Z510 Encounter for antineoplastic radiation therapy: Secondary | ICD-10-CM | POA: Diagnosis not present

## 2019-09-02 DIAGNOSIS — C714 Malignant neoplasm of occipital lobe: Secondary | ICD-10-CM

## 2019-09-03 ENCOUNTER — Other Ambulatory Visit: Payer: Self-pay

## 2019-09-03 ENCOUNTER — Ambulatory Visit
Admission: RE | Admit: 2019-09-03 | Discharge: 2019-09-03 | Disposition: A | Discharge status: Home / Self Care | Payer: Medicaid Other | Source: Ambulatory Visit | Attending: Radiation Oncology | Admitting: Radiation Oncology

## 2019-09-03 DIAGNOSIS — Z510 Encounter for antineoplastic radiation therapy: Secondary | ICD-10-CM | POA: Diagnosis not present

## 2019-09-04 ENCOUNTER — Other Ambulatory Visit: Payer: Self-pay

## 2019-09-04 ENCOUNTER — Ambulatory Visit
Admission: RE | Admit: 2019-09-04 | Discharge: 2019-09-04 | Disposition: A | Discharge status: Home / Self Care | Payer: Medicaid Other | Source: Ambulatory Visit | Attending: Radiation Oncology | Admitting: Radiation Oncology

## 2019-09-04 DIAGNOSIS — Z510 Encounter for antineoplastic radiation therapy: Secondary | ICD-10-CM | POA: Diagnosis not present

## 2019-09-05 ENCOUNTER — Other Ambulatory Visit: Payer: Self-pay

## 2019-09-05 ENCOUNTER — Ambulatory Visit
Admission: RE | Admit: 2019-09-05 | Discharge: 2019-09-05 | Disposition: A | Discharge status: Home / Self Care | Payer: Medicaid Other | Source: Ambulatory Visit | Attending: Radiation Oncology | Admitting: Radiation Oncology

## 2019-09-05 DIAGNOSIS — Z510 Encounter for antineoplastic radiation therapy: Secondary | ICD-10-CM | POA: Diagnosis not present

## 2019-09-06 ENCOUNTER — Ambulatory Visit
Admission: RE | Admit: 2019-09-06 | Discharge: 2019-09-06 | Disposition: A | Discharge status: Home / Self Care | Payer: Medicaid Other | Source: Ambulatory Visit | Attending: Radiation Oncology | Admitting: Radiation Oncology

## 2019-09-06 ENCOUNTER — Other Ambulatory Visit: Payer: Self-pay

## 2019-09-06 DIAGNOSIS — Z510 Encounter for antineoplastic radiation therapy: Secondary | ICD-10-CM | POA: Diagnosis not present

## 2019-09-09 ENCOUNTER — Ambulatory Visit
Admission: RE | Admit: 2019-09-09 | Discharge: 2019-09-09 | Disposition: A | Discharge status: Home / Self Care | Payer: Medicaid Other | Source: Ambulatory Visit | Attending: Radiation Oncology | Admitting: Radiation Oncology

## 2019-09-09 ENCOUNTER — Other Ambulatory Visit: Payer: Self-pay

## 2019-09-09 DIAGNOSIS — Z510 Encounter for antineoplastic radiation therapy: Secondary | ICD-10-CM | POA: Diagnosis not present

## 2019-09-10 ENCOUNTER — Other Ambulatory Visit: Payer: Self-pay

## 2019-09-10 ENCOUNTER — Ambulatory Visit
Admission: RE | Admit: 2019-09-10 | Discharge: 2019-09-10 | Disposition: A | Discharge status: Home / Self Care | Payer: Medicaid Other | Source: Ambulatory Visit | Attending: Radiation Oncology | Admitting: Radiation Oncology

## 2019-09-10 DIAGNOSIS — Z510 Encounter for antineoplastic radiation therapy: Secondary | ICD-10-CM | POA: Diagnosis not present

## 2019-09-11 ENCOUNTER — Ambulatory Visit
Admission: RE | Admit: 2019-09-11 | Discharge: 2019-09-11 | Disposition: A | Discharge status: Home / Self Care | Payer: Medicaid Other | Source: Ambulatory Visit | Attending: Radiation Oncology | Admitting: Radiation Oncology

## 2019-09-11 ENCOUNTER — Other Ambulatory Visit: Payer: Self-pay

## 2019-09-11 DIAGNOSIS — Z510 Encounter for antineoplastic radiation therapy: Secondary | ICD-10-CM | POA: Diagnosis not present

## 2019-09-12 ENCOUNTER — Ambulatory Visit
Admission: RE | Admit: 2019-09-12 | Discharge: 2019-09-12 | Disposition: A | Discharge status: Home / Self Care | Payer: Medicaid Other | Source: Ambulatory Visit | Attending: Radiation Oncology | Admitting: Radiation Oncology

## 2019-09-12 ENCOUNTER — Other Ambulatory Visit: Payer: Self-pay

## 2019-09-12 DIAGNOSIS — Z510 Encounter for antineoplastic radiation therapy: Secondary | ICD-10-CM | POA: Diagnosis present

## 2019-09-12 DIAGNOSIS — C719 Malignant neoplasm of brain, unspecified: Secondary | ICD-10-CM | POA: Diagnosis present

## 2019-09-13 ENCOUNTER — Other Ambulatory Visit: Payer: Self-pay

## 2019-09-13 ENCOUNTER — Ambulatory Visit
Admission: RE | Admit: 2019-09-13 | Discharge: 2019-09-13 | Disposition: A | Discharge status: Home / Self Care | Payer: Medicaid Other | Source: Ambulatory Visit | Attending: Radiation Oncology | Admitting: Radiation Oncology

## 2019-09-13 DIAGNOSIS — Z510 Encounter for antineoplastic radiation therapy: Secondary | ICD-10-CM | POA: Diagnosis not present

## 2019-09-17 ENCOUNTER — Ambulatory Visit: Payer: Medicaid Other

## 2019-09-17 ENCOUNTER — Other Ambulatory Visit: Payer: Self-pay

## 2019-09-17 ENCOUNTER — Inpatient Hospital Stay: Payer: Medicaid Other | Attending: Internal Medicine | Admitting: Internal Medicine

## 2019-09-17 ENCOUNTER — Inpatient Hospital Stay: Payer: Medicaid Other

## 2019-09-17 ENCOUNTER — Encounter: Payer: Self-pay | Admitting: Radiation Oncology

## 2019-09-17 ENCOUNTER — Ambulatory Visit
Admission: RE | Admit: 2019-09-17 | Discharge: 2019-09-17 | Disposition: A | Discharge status: Home / Self Care | Payer: Medicaid Other | Source: Ambulatory Visit | Attending: Radiation Oncology | Admitting: Radiation Oncology

## 2019-09-17 DIAGNOSIS — C714 Malignant neoplasm of occipital lobe: Secondary | ICD-10-CM

## 2019-09-17 DIAGNOSIS — Z510 Encounter for antineoplastic radiation therapy: Secondary | ICD-10-CM | POA: Diagnosis not present

## 2019-09-17 LAB — CBC WITH DIFFERENTIAL (CANCER CENTER ONLY)
Abs Immature Granulocytes: 0.02 10*3/uL (ref 0.00–0.07)
Basophils Absolute: 0 10*3/uL (ref 0.0–0.1)
Basophils Relative: 0 %
Eosinophils Absolute: 0.1 10*3/uL (ref 0.0–0.5)
Eosinophils Relative: 2 %
HCT: 45.9 % (ref 39.0–52.0)
Hemoglobin: 15.3 g/dL (ref 13.0–17.0)
Immature Granulocytes: 0 %
Lymphocytes Relative: 11 %
Lymphs Abs: 0.7 10*3/uL (ref 0.7–4.0)
MCH: 29.9 pg (ref 26.0–34.0)
MCHC: 33.3 g/dL (ref 30.0–36.0)
MCV: 89.8 fL (ref 80.0–100.0)
Monocytes Absolute: 0.8 10*3/uL (ref 0.1–1.0)
Monocytes Relative: 12 %
Neutro Abs: 4.8 10*3/uL (ref 1.7–7.7)
Neutrophils Relative %: 75 %
Platelet Count: 205 10*3/uL (ref 150–400)
RBC: 5.11 MIL/uL (ref 4.22–5.81)
RDW: 14.6 % (ref 11.5–15.5)
WBC Count: 6.4 10*3/uL (ref 4.0–10.5)
nRBC: 0 % (ref 0.0–0.2)

## 2019-09-17 LAB — CMP (CANCER CENTER ONLY)
ALT: 22 U/L (ref 0–44)
AST: 21 U/L (ref 15–41)
Albumin: 4.4 g/dL (ref 3.5–5.0)
Alkaline Phosphatase: 75 U/L (ref 38–126)
Anion gap: 12 (ref 5–15)
BUN: 14 mg/dL (ref 6–20)
CO2: 24 mmol/L (ref 22–32)
Calcium: 9.8 mg/dL (ref 8.9–10.3)
Chloride: 104 mmol/L (ref 98–111)
Creatinine: 0.95 mg/dL (ref 0.61–1.24)
GFR, Est AFR Am: >60 mL/min (ref >60)
GFR, Estimated: >60 mL/min (ref >60)
Glucose, Bld: 97 mg/dL (ref 70–99)
Potassium: 4.8 mmol/L (ref 3.5–5.1)
Sodium: 140 mmol/L (ref 135–145)
Total Bilirubin: 0.3 mg/dL (ref 0.3–1.2)
Total Protein: 7.5 g/dL (ref 6.5–8.1)

## 2019-09-18 ENCOUNTER — Ambulatory Visit: Payer: Medicaid Other

## 2019-09-19 ENCOUNTER — Other Ambulatory Visit: Payer: Self-pay | Admitting: Internal Medicine

## 2019-09-19 DIAGNOSIS — C714 Malignant neoplasm of occipital lobe: Secondary | ICD-10-CM

## 2019-09-24 ENCOUNTER — Telehealth: Payer: Self-pay | Admitting: Internal Medicine

## 2019-09-24 NOTE — Telephone Encounter (Signed)
Scheduled appt per 7/12 sch msg - pt is aware of appt scheduled . And pt was given the number for central radiology. He is aware that he should call them to set up MRI appt.

## 2019-10-03 ENCOUNTER — Other Ambulatory Visit: Payer: Self-pay | Admitting: Radiation Therapy

## 2019-10-10 ENCOUNTER — Telehealth: Payer: Self-pay | Admitting: Radiation Oncology

## 2019-10-10 NOTE — Telephone Encounter (Signed)
  Radiation Oncology         [phone] ________________________________  Name: Stephen Beard MRN: [id number]  Date of Service: 10/10/2019  DOB: 1995/05/30  Post Treatment Telephone Note  Diagnosis:   Grade 3 IDH mutated anaplastic oligodendroglioma  Interval Since Last Radiation:  3 weeks   08/05/19-09/17/19: The postoperative target in the right occipital lobe was treated to 46 Gy in 23 fractions followed by a 14 Gy boost in 7 fractions.  Narrative:  The patient was contacted today for routine follow-up. During treatment he did very well with radiotherapy and did not have significant desquamation. He will go for post treatment MRI tomorrow and his case will be discussed next Monday in [place].  Impression/Plan: 1. Grade 3 IDH mutated anaplastic oligodendroglioma. The patient has been doing well since completion of radiotherapy. I left a voicemail to check on the patient and on it I discussed that we would be happy to continue to follow him as needed, but he will also continue to follow up with Dr. Mickeal Skinner in neuro oncology.      Carola Rhine, PAC

## 2019-10-11 ENCOUNTER — Ambulatory Visit (HOSPITAL_COMMUNITY)
Admission: RE | Admit: 2019-10-11 | Discharge: 2019-10-11 | Disposition: A | Discharge status: Home / Self Care | Payer: Medicaid Other | Source: Ambulatory Visit | Attending: Internal Medicine | Admitting: Internal Medicine

## 2019-10-11 ENCOUNTER — Other Ambulatory Visit: Payer: Self-pay

## 2019-10-11 DIAGNOSIS — C714 Malignant neoplasm of occipital lobe: Secondary | ICD-10-CM | POA: Diagnosis not present

## 2019-10-11 IMAGING — MR MR HEAD WO/W CM
16 series · 48 of 48 positions shown · IV contrast (gadavist)
Comparison: [DATE].  [DATE].

CLINICAL DATA: Follow-up glioma resection.

EXAM:
MRI HEAD WITHOUT AND WITH CONTRAST
TECHNIQUE: Multiplanar, multiecho pulse sequences of the brain and surrounding
structures were obtained without and with intravenous contrast.
CONTRAST:  7.5mL GADAVIST GADOBUTROL 1 MMOL/ML IV SOLN

[Series 5: DWI · axial · 3.0mm · 1.36mm/px · z∈[-38,+114]mm · 4 of 104 slices shown (1 of 4)]
[im 1/104]
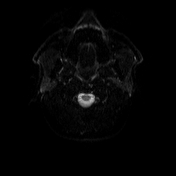
[im 35/104]
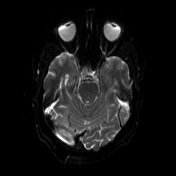
[im 69/104]
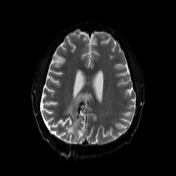
[im 104/104]
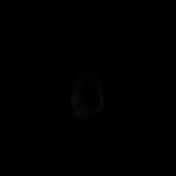

[Series 6: DWI · axial · 3.0mm · 1.36mm/px · z∈[-38,+114]mm · 2 of 50 slices shown (2 of 4)]
[im 1/50]
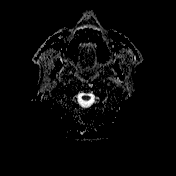
[im 50/50]
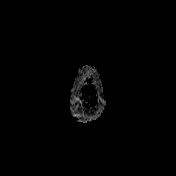

[Series 7: T1 · sagittal · 5.0mm · 0.69mm/px · 1 of 25 slices shown (1 of 2)]
[im 1/25]
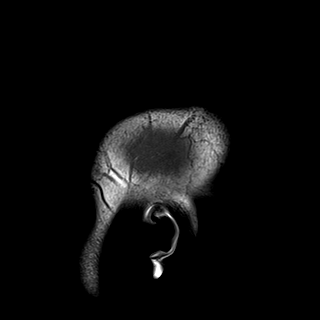

[Series 8: T2 · axial · 5.0mm · 0.55mm/px · 1 of 26 slices shown]
[im 1/26]
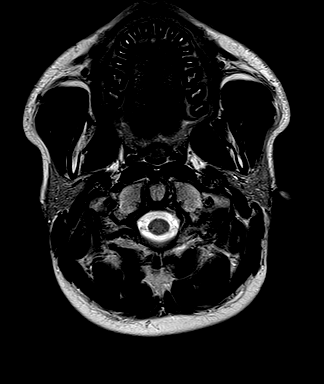

[Series 9: mip_images(sw) · axial · 24.0mm · 0.66mm/px · z∈[-35,+109]mm · 2 of 49 slices shown]
[im 1/49]
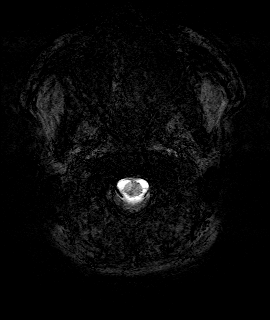
[im 49/49]
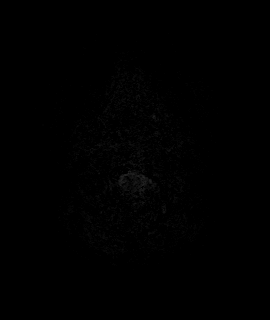

[Series 10: swi_images · axial · 3.0mm · 0.66mm/px · z∈[-45,+119]mm · 2 of 56 slices shown]
[im 1/56]
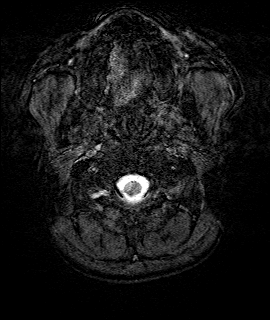
[im 56/56]
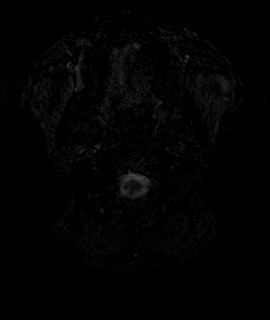

[Series 11: FLAIR · axial · 3.0mm · 0.66mm/px · z∈[-39,+113]mm · 2 of 52 slices shown]
[im 1/52]
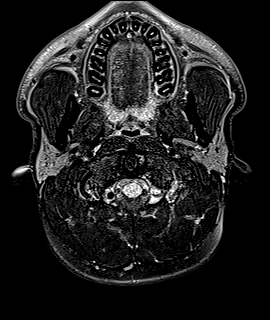
[im 52/52]
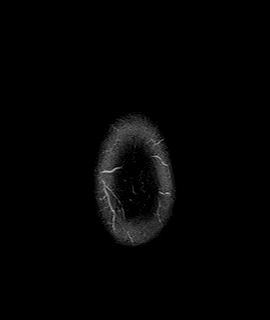

[Series 12: T1 · axial · 1.0mm · 0.82mm/px · z∈[-50,+124]mm · 7 of 176 slices shown (2 of 2)]
[im 1/176]
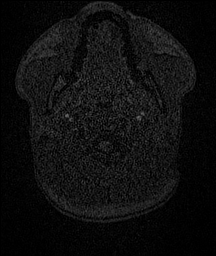
[im 30/176]
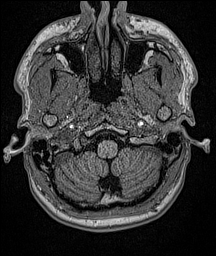
[im 59/176]
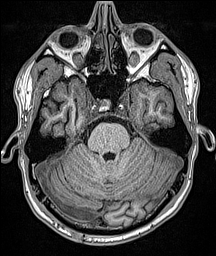
[im 88/176]
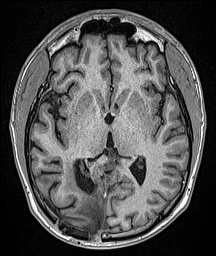
[im 117/176]
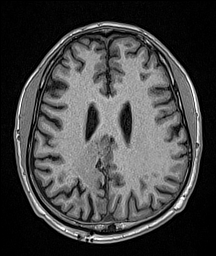
[im 146/176]
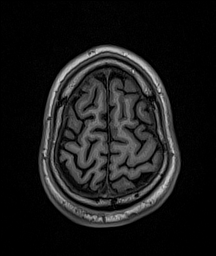
[im 176/176]
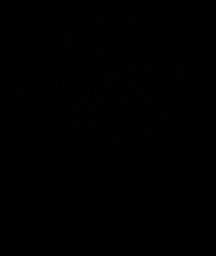

[Series 13: DWI · coronal · 5.0mm · 1.19mm/px · 3 of 76 slices shown (3 of 4)]
[im 1/76]
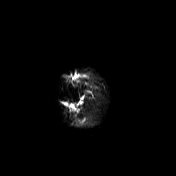
[im 38/76]
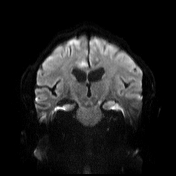
[im 76/76]
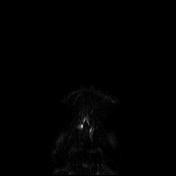

[Series 14: DWI · coronal · 5.0mm · 1.19mm/px · 2 of 38 slices shown (4 of 4)]
[im 1/38]
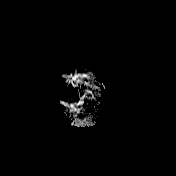
[im 38/38]
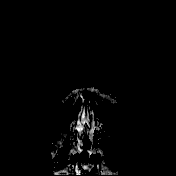

[Series 15: T2 post-contrast · coronal · 5.0mm · 0.55mm/px · 1 of 30 slices shown]
[im 1/30]
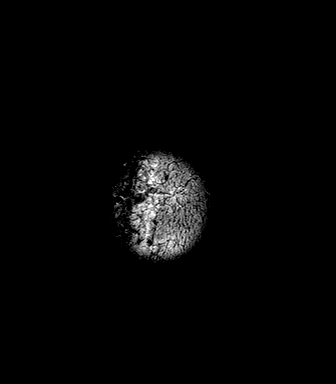

[Series 16: T1 post-contrast · axial · 1.0mm · 0.82mm/px · z∈[-50,+124]mm · 7 of 176 slices shown (1 of 3)]
[im 1/176]
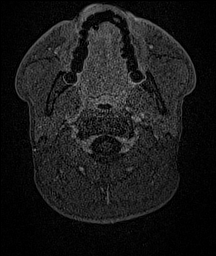
[im 30/176]
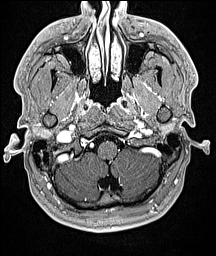
[im 59/176]
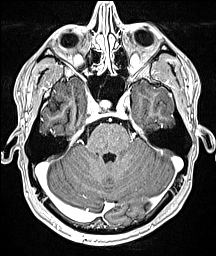
[im 88/176]
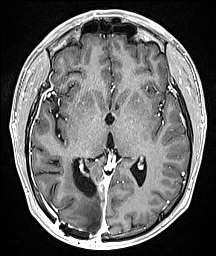
[im 117/176]
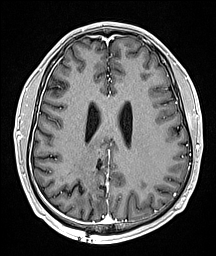
[im 146/176]
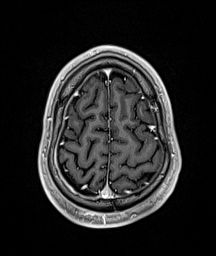
[im 176/176]
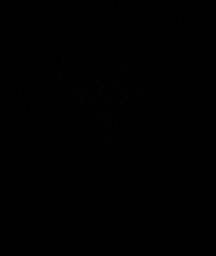

[Series 17: T1 post-contrast · coronal · 5.0mm · 0.43mm/px · 1 of 30 slices shown (2 of 3)]
[im 1/30]
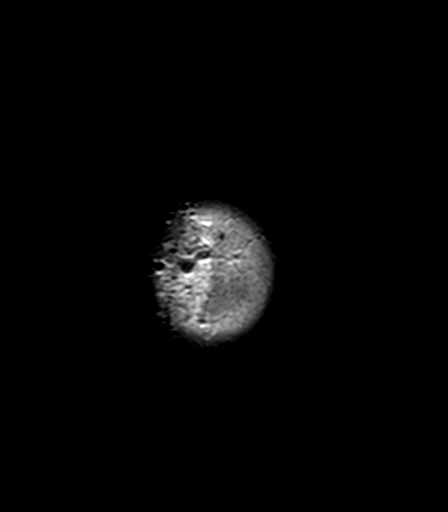

[Series 18: T1 post-contrast · sagittal · 5.0mm · 0.69mm/px · 1 of 25 slices shown (3 of 3)]
[im 1/25]
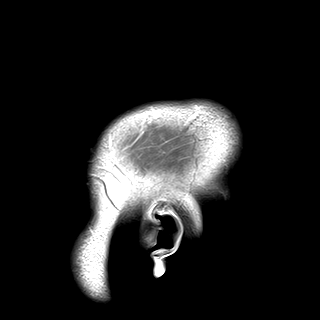

[Series 100: <mpr range> · sagittal · 1.0mm · 0.82mm/px · 6 of 150 slices shown]
[im 1/150]
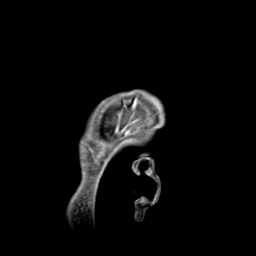
[im 30/150]
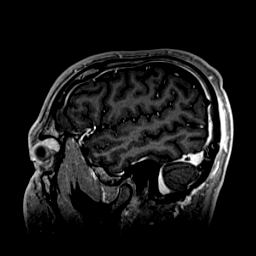
[im 60/150]
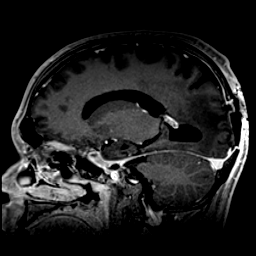
[im 90/150]
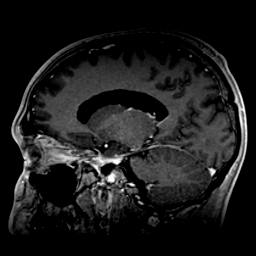
[im 120/150]
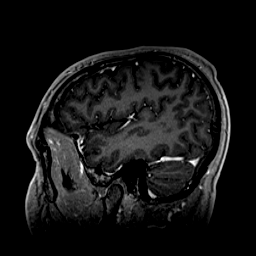
[im 150/150]
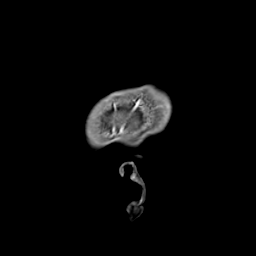

[Series 101: <mpr range(1)> · coronal · 1.0mm · 0.82mm/px · 6 of 150 slices shown]
[im 1/150]
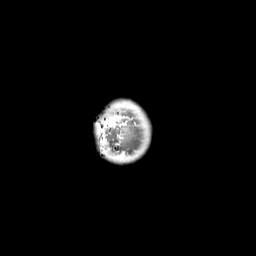
[im 30/150]
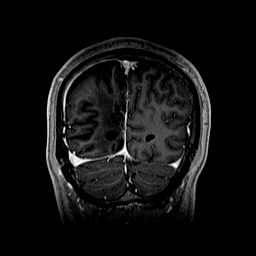
[im 60/150]
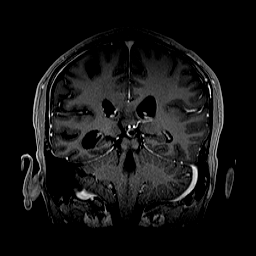
[im 90/150]
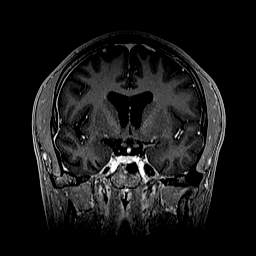
[im 120/150]
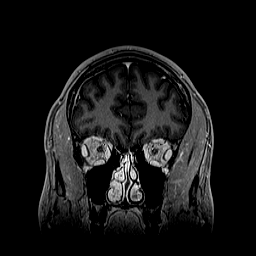
[im 150/150]
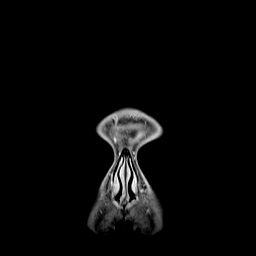

[48 of 48 positions shown; findings below may reference images not displayed]

FINDINGS: Brain: Previous right posterior parietal craniotomy for resection of
a large mass in the posteromedial right hemisphere. Immediate
postoperative changes have resolved. There is resolution of mass
effect and shift. Adjacent to the region of resection, the brain
shows a small region of focal enhancement posterior and adjacent to
the atrium of the right lateral ventricle. This could possibly
represent residual tumor. Otherwise, a few small areas of low level
wispy enhancement may simply be postoperative in nature. No evidence
of actual mass effect. Abnormal T2 signal within the splenium of the
corpus callosum but without enhancement. This will serve as a
baseline for subsequent follow up studies. No obstructive
hydrocephalus or extra-axial fluid collection.

Vascular: Major vessels at the base of the brain show flow.

Skull and upper cervical spine: Otherwise negative

Sinuses/Orbits: Clear/normal

Other: None
IMPRESSION: Good postoperative appearance with complete or near complete
resection of the previously seen posteromedial right hemispheric
mass. Postoperative encephalomalacia with some adjacent gliotic
signal. Small region of enhancement adjacent to and posterior to the
atrium of the right lateral ventricle could be postoperative or
evidence of some residual tumor. This will serve as a baseline for
follow-up studies.

## 2019-10-11 MED ORDER — GADOBUTROL 1 MMOL/ML IV SOLN
7.5000 mL | Freq: Once | INTRAVENOUS | Status: AC | PRN
  Administered 2019-10-11: 7.5 mL via INTRAVENOUS

## 2019-10-14 ENCOUNTER — Inpatient Hospital Stay: Payer: Medicaid Other | Attending: Internal Medicine

## 2019-10-14 DIAGNOSIS — Z923 Personal history of irradiation: Secondary | ICD-10-CM | POA: Insufficient documentation

## 2019-10-14 DIAGNOSIS — C714 Malignant neoplasm of occipital lobe: Secondary | ICD-10-CM | POA: Insufficient documentation

## 2019-10-14 DIAGNOSIS — F419 Anxiety disorder, unspecified: Secondary | ICD-10-CM | POA: Insufficient documentation

## 2019-10-14 DIAGNOSIS — G4700 Insomnia, unspecified: Secondary | ICD-10-CM | POA: Insufficient documentation

## 2019-10-14 DIAGNOSIS — F909 Attention-deficit hyperactivity disorder, unspecified type: Secondary | ICD-10-CM | POA: Insufficient documentation

## 2019-10-14 DIAGNOSIS — F17210 Nicotine dependence, cigarettes, uncomplicated: Secondary | ICD-10-CM | POA: Insufficient documentation

## 2019-10-14 DIAGNOSIS — Z9221 Personal history of antineoplastic chemotherapy: Secondary | ICD-10-CM | POA: Insufficient documentation

## 2019-10-14 DIAGNOSIS — Z79899 Other long term (current) drug therapy: Secondary | ICD-10-CM | POA: Insufficient documentation

## 2019-10-15 ENCOUNTER — Other Ambulatory Visit: Payer: Self-pay

## 2019-10-15 ENCOUNTER — Inpatient Hospital Stay (HOSPITAL_BASED_OUTPATIENT_CLINIC_OR_DEPARTMENT_OTHER): Payer: Medicaid Other | Admitting: Internal Medicine

## 2019-10-15 ENCOUNTER — Telehealth: Payer: Self-pay | Admitting: Pharmacist

## 2019-10-15 VITALS — BP 110/71 | HR 66 | Temp 97.7°F | Resp 18 | Ht 67.0 in | Wt 136.7 lb

## 2019-10-15 DIAGNOSIS — C714 Malignant neoplasm of occipital lobe: Secondary | ICD-10-CM

## 2019-10-15 DIAGNOSIS — Z923 Personal history of irradiation: Secondary | ICD-10-CM | POA: Diagnosis not present

## 2019-10-15 DIAGNOSIS — G4700 Insomnia, unspecified: Secondary | ICD-10-CM | POA: Diagnosis not present

## 2019-10-15 DIAGNOSIS — F419 Anxiety disorder, unspecified: Secondary | ICD-10-CM | POA: Diagnosis not present

## 2019-10-15 DIAGNOSIS — F909 Attention-deficit hyperactivity disorder, unspecified type: Secondary | ICD-10-CM | POA: Diagnosis not present

## 2019-10-15 DIAGNOSIS — F17210 Nicotine dependence, cigarettes, uncomplicated: Secondary | ICD-10-CM | POA: Diagnosis not present

## 2019-10-15 DIAGNOSIS — Z79899 Other long term (current) drug therapy: Secondary | ICD-10-CM | POA: Diagnosis not present

## 2019-10-15 DIAGNOSIS — Z9221 Personal history of antineoplastic chemotherapy: Secondary | ICD-10-CM | POA: Diagnosis not present

## 2019-10-15 MED ORDER — TEMOZOLOMIDE 140 MG PO CAPS: 150.0000 mg/m2/d | ORAL_CAPSULE | Freq: Every day | ORAL | 0 refills | Status: AC

## 2019-10-15 MED ORDER — ONDANSETRON HCL 8 MG PO TABS: 8.0000 mg | ORAL_TABLET | Freq: Two times a day (BID) | ORAL | 1 refills | Status: DC | PRN

## 2019-10-15 MED FILL — TEMOZOLOMIDE 140 MG CAPS: 140 | 28 days supply | Qty: 10 | Fill #0

## 2019-10-15 MED FILL — ONDANSETRON HCL 8 MG TABLET: 8 | 15 days supply | Qty: 30 | Fill #0

## 2019-10-15 NOTE — Progress Notes (Signed)
[facility] [place], [place] [address] [phone]   Interval Evaluation  Date of Service: 10/15/19 Patient Name: Stephen Beard Patient MRN: [id number] Patient DOB: [birth year] Provider: [name], MD  Identifying Statement:  Stephen Beard is a 24 y.o. male with right parietal anaplastic oligodendoglioma   Oncologic History: Oncology History  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[facility])  07/02/2019 Surgery   Craniotomy, resection by Dr. Zada Finders. Followed by hematoma evacuation on 07/03/19.   08/05/2019 - 09/17/2019 Radiation Therapy   IMRT with concurrent Temodar 22m/m2 daily     Biomarkers:  MGMT Unknown.  IDH 1/2 Mutated.  EGFR Unknown  1p/19q co-deleted   Interval History:  [name] presents today for follow up, now having completed IMRT and concurrent Temodar.  No new or progressive neurologic deficits, no seizures or headaches.  Visual field impairment is stable.  Continues to describe auditory hallucinations, is not taking abilify and has not been seen yet by psychiatry.  H+P (07/19/19) Patient presented to medical attention with several months of progressive headaches and left sided visual impairment.  This was accompanied by episodes of deja-vu +paroxysmal burning smells which would occur almost daily over that time.  CNS imaging demonstrated large parieto-occipital mass with herniation syndrome.  Mass was resected/debulked, and post-operative course was complicated by intracranial hematoma and also respiratory failure.  At present, he is walking and talking, carries left sided visual impairment.  Currently on antibiotics for wound infection per Dr. OZada Finders  Not requiring steroids.  Currently on lamictal 513mdaily.  Medications: Current Outpatient Medications on File Prior to Visit  Medication Sig Dispense Refill  . ARIPiprazole (ABILIFY) 5 MG tablet Take 1 tablet (5 mg total) by mouth  daily. 30 tablet 1  . Docusate Sodium (DSS) 100 MG CAPS Take by mouth.    . lamoTRIgine (LAMICTAL) 100 MG tablet Take 1 tablet (100 mg total) by mouth daily. Take 1 tablet 10016maily in AM 30 tablet 3  . ondansetron (ZOFRAN) 8 MG tablet Take 1 tablet (8 mg total) by mouth 2 (two) times daily as needed (nausea and vomiting). May take 30-60 minutes prior to Temodar administration if nausea/vomiting occurs. 30 tablet 1  . oxyCODONE-acetaminophen (PERCOCET/ROXICET) 5-325 MG tablet Take by mouth every 4 (four) hours as needed for severe pain.    . sMarland Kitchennna (SENOKOT) 8.6 MG TABS tablet Take 1 tablet (8.6 mg total) by mouth daily. 60 tablet 2  . temozolomide (TEMODAR) 100 MG capsule Take 1 capsule (100 mg total) by mouth daily. May take on an empty stomach to decrease nausea & vomiting. 42 capsule 0  . temozolomide (TEMODAR) 20 MG capsule Take 1 capsule (20 mg total) by mouth daily. May take on an empty stomach to decrease nausea & vomiting. 42 capsule 0   No current facility-administered medications on file prior to visit.    Allergies: No Known Allergies Past Medical History:  Past Medical History:  Diagnosis Date  . ADHD    Past Surgical History:  Past Surgical History:  Procedure Laterality Date  . APPLICATION OF CRANIAL NAVIGATION N/A 07/02/2019   Procedure: APPLICATION OF CRANIAL NAVIGATION;  Surgeon: OstJudith PartD;  [facility];  Laterality: N/A;  . CRANIOTOMY N/A 07/03/2019   Procedure: CRANIOTOMY HEMATOMA EVACUATION SUBDURAL;  Surgeon: OstJudith PartD;  [facility];  Laterality: N/A;  . CRANIOTOMY Right 07/03/2019   Procedure: CRANIOTOMY HEMATOMA  EVACUATION SUBDURAL;  Surgeon: Judith Part, MD;  Location: [facility];  Service: Neurosurgery;  Laterality: Right;  . CRANIOTOMY Right 07/02/2019   Procedure: CRANIOTOMY TUMOR EXCISION with Lucky Rathke;  Surgeon: Judith Part, MD;  Location: [facility];  Service:  Neurosurgery;  Laterality: Right;  posterior   Social History:  Social History   Socioeconomic History  . Marital status: Single    Spouse name: Not on file  . Number of children: Not on file  . Years of education: Not on file  . Highest education level: Not on file  Occupational History  . Not on file  Tobacco Use  . Smoking status: Current Every Day Smoker  . Smokeless tobacco: Current User    Types: Chew  Substance and Sexual Activity  . Alcohol use: Yes  . Drug use: No  . Sexual activity: Not on file  Other Topics Concern  . Not on file  Social History Narrative  . Not on file   Social Determinants of Health   Financial Resource Strain:   . Difficulty of Paying Living Expenses:   Food Insecurity:   . Worried About Charity fundraiser in the Last Year:   . Arboriculturist in the Last Year:   Transportation Needs:   . Film/video editor (Medical):   Marland Kitchen Lack of Transportation (Non-Medical):   Physical Activity:   . Days of Exercise per Week:   . Minutes of Exercise per Session:   Stress:   . Feeling of Stress :   Social Connections:   . Frequency of Communication with Friends and Family:   . Frequency of Social Gatherings with Friends and Family:   . Attends Religious Services:   . Active Member of Clubs or Organizations:   . Attends Archivist Meetings:   Marland Kitchen Marital Status:   Intimate Partner Violence:   . Fear of Current or Ex-Partner:   . Emotionally Abused:   Marland Kitchen Physically Abused:   . Sexually Abused:    Family History: No family history on file.  Review of Systems: Constitutional: Doesn't report fevers, chills or abnormal weight loss Eyes: Doesn't report blurriness of vision Ears, nose, mouth, throat, and face: Doesn't report sore throat Respiratory: Doesn't report cough, dyspnea or wheezes Cardiovascular: Doesn't report palpitation, chest discomfort  Gastrointestinal:  Doesn't report nausea, constipation, diarrhea GU: Doesn't report  incontinence Skin: Doesn't report skin rashes Neurological: Per HPI Musculoskeletal: Doesn't report joint pain Behavioral/Psych: Doesn't report anxiety  Physical Exam: Vitals:   10/15/19 1245  BP: 110/71  Pulse: 66  Resp: 18  Temp: 97.7 F (36.5 C)  SpO2: 100%   KPS: 80. General: Alert, cooperative, pleasant, in no acute distress Head: Normal EENT: No conjunctival injection or scleral icterus.  Lungs: Resp effort normal Cardiac: Regular rate Abdomen: Non-distended abdomen Skin: No rashes cyanosis or petechiae. Extremities: No clubbing or edema  Neurologic Exam: Mental Status: Awake, alert, attentive to examiner. Oriented to self and environment. Language is fluent with intact comprehension.  Cranial Nerves: Visual acuity is grossly normal. Left homonymous hemianopia. Extra-ocular movements intact. No ptosis. Face is symmetric Motor: Tone and bulk are normal. Power is full in both arms and legs. Reflexes are symmetric, no pathologic reflexes present.  Sensory: Intact to light touch Gait: Sensory dystaxia   Labs: I have reviewed the data as listed    Component Value Date/Time   NA 140 09/17/2019 1508   K 4.8 09/17/2019 1508   CL 104 09/17/2019 1508  CO2 24 09/17/2019 1508   GLUCOSE 97 09/17/2019 1508   BUN 14 09/17/2019 1508   CREATININE 0.95 09/17/2019 1508   CALCIUM 9.8 09/17/2019 1508   PROT 7.5 09/17/2019 1508   ALBUMIN 4.4 09/17/2019 1508   AST 21 09/17/2019 1508   ALT 22 09/17/2019 1508   ALKPHOS 75 09/17/2019 1508   BILITOT 0.3 09/17/2019 1508   GFRNONAA >60 09/17/2019 1508   GFRAA >60 09/17/2019 1508   Lab Results  Component Value Date   WBC 6.4 09/17/2019   NEUTROABS 4.8 09/17/2019   HGB 15.3 09/17/2019   HCT 45.9 09/17/2019   MCV 89.8 09/17/2019   PLT 205 09/17/2019    Imaging:  [facility] Clinician Interpretation: I have personally reviewed the CNS images as listed.  My interpretation, in the context of the patient's clinical presentation, is  stable disease  MR BRAIN W WO CONTRAST  Result Date: 10/12/2019 CLINICAL DATA:  Follow-up glioma resection. EXAM: MRI HEAD WITHOUT AND WITH CONTRAST TECHNIQUE: Multiplanar, multiecho pulse sequences of the brain and surrounding structures were obtained without and with intravenous contrast. CONTRAST:  7.56m GADAVIST GADOBUTROL 1 MMOL/ML IV SOLN COMPARISON:  07/03/2019.  06/30/2019. FINDINGS: Brain: Previous right posterior parietal craniotomy for resection of a large mass in the posteromedial right hemisphere. Immediate postoperative changes have resolved. There is resolution of mass effect and shift. Adjacent to the region of resection, the brain shows a small region of focal enhancement posterior and adjacent to the atrium of the right lateral ventricle. This could possibly represent residual tumor. Otherwise, a few small areas of low level wispy enhancement may simply be postoperative in nature. No evidence of actual mass effect. Abnormal T2 signal within the splenium of the corpus callosum but without enhancement. This will serve as a baseline for subsequent follow up studies. No obstructive hydrocephalus or extra-axial fluid collection. Vascular: Major vessels at the base of the brain show flow. Skull and upper cervical spine: Otherwise negative Sinuses/Orbits: Clear/normal Other: None IMPRESSION: Good postoperative appearance with complete or near complete resection of the previously seen posteromedial right hemispheric mass. Postoperative encephalomalacia with some adjacent gliotic signal. Small region of enhancement adjacent to and posterior to the atrium of the right lateral ventricle could be postoperative or evidence of some residual tumor. This will serve as a baseline for follow-up studies. Electronically Signed   By: MNelson ChimesM.D.   On: 10/12/2019 13:17    Assessment/Plan 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[facility]) [C71.4]   Damarius L Finfrock is clinically and radiographically stable  today, now having completed full course of IMRT and Temodar.  We are encouraged by his good clinical status and diminished burden of T2 signal abnormality on MRI.  We recommended initiating treatment with cycle #1 Temozolomide 156mm2, on for five days and off for twenty three days in twenty eight day cycles. The patient will have a complete blood count performed on days 21 and 28 of each cycle, and a comprehensive metabolic panel performed on day 28 of each cycle. Labs may need to be performed more often. Zofran will prescribed for home use for nausea/vomiting.   Chemotherapy should be held for the following:  ANC less than 1,000  Platelets less than 100,000  LFT or creatinine greater than 2x ULN  If clinical concerns/contraindications develop  Strongly encouarged following up with pyschiatry referral for auditory hallucinations.  Will con't Lamictal 10017maily for seizure prevention, mood stabilization.  We ask that BraXaiver Roskelleyidgers return to clinic in 1 months prior to  cycle #2, or sooner as needed.  All questions were answered. The patient knows to call the clinic with any problems, questions or concerns. No barriers to learning were detected.  I have spent a total of 40 minutes of face-to-face and non-face-to-face time, excluding clinical staff time, preparing to see patient, ordering tests and/or medications, counseling the patient, and independently interpreting results and communicating results to the patient/family/caregiver    [name], MD Medical Director of Neuro-Oncology [facility] at [place] 10/15/19 12:40 PM

## 2019-10-15 NOTE — Progress Notes (Signed)
DISCONTINUE ON PATHWAY REGIMEN - Neuro     One cycle, concurrent with RT:     Temozolomide   **Always confirm dose/schedule in your pharmacy ordering system**  REASON: Continuation Of Treatment PRIOR TREATMENT: [id number]: Radiation Therapy with Concurrent Temozolomide 75 mg/m2 Daily x 6 Weeks, Followed by Sequential Temozolomide TREATMENT RESPONSE: Stable Disease (SD)  START ON PATHWAY REGIMEN - Neuro     A cycle is every 28 days:     Temozolomide      Temozolomide   **Always confirm dose/schedule in your pharmacy ordering system**  Patient Characteristics: Anaplastic Glioma (Grade III Astrocytoma / Oligodendroglioma), Newly Diagnosed / Treatment Naive, Codeleted (1p/19q) Disease Classification: Glioma Disease Classification: Anaplastic Glioma (Grade III Astrocytoma/Oligodendroglioma) Disease Status: Newly Diagnosed / Treatment Naive 1p/19q  Deletion Status: Codeleted (1p/19q) Intent of Therapy: Non-Curative / Palliative Intent, Discussed with Patient

## 2019-10-15 NOTE — Telephone Encounter (Addendum)
Oral Oncology Pharmacist Encounter  Received new prescription for Temodar (temozolomide) for the treatment of anaplastic oligodendroglioma, s/p 6 weeks of concomitant IMRT and Temodar, planned duration 6-12 months of treatment.  Prescription dose and frequency assessed for appropriateness. OK for therapy initiation.    CBC w/ Diff and CMP from 09/17/2019 assessed, no relevant lab abnormalities noted.  Current medication list in Epic reviewed, no relevant DDIs with Temodar identified.  No barriers to medication adherence noted on chart review.   Prescription has been e-scribed to the [facility] for benefits analysis and approval.  Oral Oncology Clinic will continue to follow for insurance authorization, copayment issues, initial counseling and start date.  Leron Croak, PharmD, BCPS Hematology/Oncology Clinical Pharmacist [facility] [phone] 10/15/2019 3:54 PM

## 2019-10-17 ENCOUNTER — Telehealth: Payer: Self-pay | Admitting: Internal Medicine

## 2019-10-17 NOTE — Telephone Encounter (Signed)
No 8/3 los °

## 2019-10-17 NOTE — Telephone Encounter (Signed)
Oral Chemotherapy Pharmacist Encounter   Attempted to reach patient for follow up on oral medication: Temodar (temozolomide).  No answer. Left voicemail for patient to call back with any questions or issues.   Leron Croak, PharmD, BCPS Hematology/Oncology Clinical Pharmacist [facility] [phone] 10/17/2019 9:41 AM

## 2019-10-18 NOTE — Telephone Encounter (Signed)
Oral Chemotherapy Pharmacist Encounter  Mr. Leadbetter notified that Temodar is ready for pick up from [facility]. He stated he plans to pick up medication 10/18/19, but would like to start on Monday, 10/21/19.   I spoke with patient for overview of: Temodar (temozolomide) for the maintenance treatment of anaplastic oligodendroglioma, planned duration 6-12 months of treatment.  Counseled on administration, dosing, side effects, monitoring, drug-food interactions, safe handling, storage, and disposal.  Patient will take Temodar 140mg  capsules, 2 capsules, (280mg  total daily dose), by mouth once daily, may take at bedtime and on an empty stomach to decrease nausea and vomiting.   If 1st cycle is well tolerated, Mr. Chalfin informed that Temodar dose may be increased to 200 mg/m2 daily for 5 days on, 23 days off, repeated every 28 days for subsequent cycles   Patient will take Temodar daily for 5 days on, 23 days off, and repeated.  Temodar start date: 10/21/2019   Discussed with patient previous side effects experienced while on Temodar and XRT. He stated that he only had issues with nausea/vomiting. He stated that the antiemetics did help alleviate the nausea/vomiting when taken before meals and before taking hisTemodar.   Patient will take Zofran 8mg  tablet, 1 tablet by mouth 30-60 min prior to Temodar dose to help decrease N/V.  In addition to the nausea and vomiting, we reviewed other adverse effects of the medication that include but are not limited to: anorexia, GI upset, rash, drug fever, and fatigue. Rare but serious adverse effects of pneumocystis pneumonia and secondary malignancy also discussed.  PCP prophylaxis will not be initiated at this time, but may be added based on lymphocyte count in the future.  Reviewed importance of keeping a medication schedule and plan for any missed doses. No barriers to medication adherence identified.  Medication reconciliation performed and medication/allergy  list updated.  Patient's Alight Fatima Sanger will be used for covering the cost of Temodar. Test claim at the pharmacy revealed copayment $50.80 for 1st fill of Temodar.  Patient informed the pharmacy will reach out 5-7 days prior to needing next fill of Temodar to coordinate continued medication acquisition to prevent break in therapy.  All questions answered.  Mr. Futch voiced understanding and appreciation.   Patient knows to call the office with questions or concerns.  Leron Croak, PharmD, BCPS Hematology/Oncology Clinical Pharmacist [facility] [phone] 10/18/2019 10:39 AM

## 2019-10-19 NOTE — Progress Notes (Signed)
  Radiation Oncology         [phone] ________________________________  Name: Stephen Beard MRN: [id number]  Date: 09/17/2019  DOB: 03/12/1996  End of Treatment Note  Diagnosis:  anaplastic oligodendroglioma     Indication for treatment::  curative       Radiation treatment dates:   08/05/19 - 09/17/19  Site/dose:   The patient was treated to the postoperative region and areas of edema initially to a dose of 46 Gy using a IMRT technique.  The patient then received a 14 Gy boost to yield a final dose of 60 Gy.  Narrative: The patient tolerated radiation treatment relatively well.     Plan: The patient has completed radiation treatment. The patient will return to radiation oncology clinic for routine followup in one month. I advised the patient to call or return sooner if they have any questions or concerns related to their recovery or treatment. ________________________________  Jodelle Gross, M.D., Ph.D.

## 2019-10-29 MED FILL — TEMOZOLOMIDE 140 MG CAPS: 140 | 28 days supply | Qty: 10 | Fill #0

## 2019-11-01 ENCOUNTER — Telehealth: Payer: Self-pay | Admitting: *Deleted

## 2019-11-01 ENCOUNTER — Other Ambulatory Visit: Payer: Self-pay | Admitting: *Deleted

## 2019-11-01 DIAGNOSIS — C714 Malignant neoplasm of occipital lobe: Secondary | ICD-10-CM

## 2019-11-01 MED ORDER — ONDANSETRON HCL 8 MG PO TABS: 8.0000 mg | ORAL_TABLET | Freq: Two times a day (BID) | ORAL | 1 refills | Status: DC | PRN

## 2019-11-01 MED FILL — ONDANSETRON HCL 8 MG TABLET: 8 | 15 days supply | Qty: 30 | Fill #0

## 2019-11-01 NOTE — Telephone Encounter (Signed)
Received vm message from patient requesting refill on his Zofran. This was escribed today

## 2019-11-05 ENCOUNTER — Telehealth: Payer: Self-pay | Admitting: *Deleted

## 2019-11-05 ENCOUNTER — Other Ambulatory Visit: Payer: Self-pay | Admitting: *Deleted

## 2019-11-05 DIAGNOSIS — C714 Malignant neoplasm of occipital lobe: Secondary | ICD-10-CM

## 2019-11-05 NOTE — Telephone Encounter (Signed)
Referral for psychatry processed today.  Patients significant other was made aware to follow up with [phone] if no appointment is scheduled within the following day or two.

## 2019-11-06 ENCOUNTER — Telehealth: Payer: Self-pay | Admitting: *Deleted

## 2019-11-06 ENCOUNTER — Telehealth: Payer: Self-pay | Admitting: Internal Medicine

## 2019-11-06 NOTE — Telephone Encounter (Signed)
Scheduled apt per 8/25 sch msg - left message for patient with appt date and time

## 2019-11-06 NOTE — Telephone Encounter (Signed)
Received call from pt's mother, Stephen Beard. She states her son is not doing very well lately. She states he is hearing voices in his head that are not really there and overall not feeling. They have not heard from psychiatrist office yet. She wanted to bring Stephen Beard to the cancer center to see Dr. Mickeal Beard today.  Advised that Dr. Mickeal Beard is not in the office today but that he could see Stephen Beard tomorrow around 10:30 * No surgery found * she could take him to the ED for psych eval.  She prefers to come here tomorrow to see Dr. Mickeal Beard.  High priority scheduling message sent.

## 2019-11-07 ENCOUNTER — Other Ambulatory Visit: Payer: Self-pay

## 2019-11-07 ENCOUNTER — Inpatient Hospital Stay (HOSPITAL_BASED_OUTPATIENT_CLINIC_OR_DEPARTMENT_OTHER): Payer: Medicaid Other | Admitting: Internal Medicine

## 2019-11-07 VITALS — BP 116/77 | HR 92 | Temp 98.2°F | Resp 18 | Ht 67.0 in | Wt 133.4 lb

## 2019-11-07 DIAGNOSIS — R569 Unspecified convulsions: Secondary | ICD-10-CM | POA: Diagnosis not present

## 2019-11-07 DIAGNOSIS — C714 Malignant neoplasm of occipital lobe: Secondary | ICD-10-CM | POA: Diagnosis not present

## 2019-11-07 MED ORDER — LAMOTRIGINE 100 MG PO TABS: 100.0000 mg | ORAL_TABLET | Freq: Two times a day (BID) | ORAL | 3 refills | Status: DC

## 2019-11-07 MED ORDER — MIRTAZAPINE 15 MG PO TABS
15.0000 mg | ORAL_TABLET | Freq: Every day | ORAL | 3 refills | Status: DC
Start: 2019-11-07 — End: 2019-12-12

## 2019-11-07 NOTE — Progress Notes (Signed)
[facility] [place], [place] [address] [phone]   Interval Evaluation  Date of Service: 11/07/19 Patient Name: Stephen Beard Patient MRN: [id number] Patient DOB: 1995-09-19 Provider: [name], MD  Identifying Statement:  Stephen Beard is a 23 y.o. 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 Zada Finders. Followed by hematoma evacuation on 07/03/19.   08/05/2019 - 09/17/2019 Radiation Therapy   IMRT with concurrent Temodar 31m/m2 daily   10/15/2019 -  Chemotherapy   The patient had dexamethasone (DECADRON) 4 MG tablet, 1 of 1 cycle, Start date: --, End date: -- temozolomide (TEMODAR) 5 MG capsule, 200 mg/m2/day = 340 mg, Oral, Daily, 1 of 1 cycle, Start date: --, End date: -- temozolomide (TEMODAR) 20 MG capsule, 200 mg/m2/day = 340 mg, Oral, Daily, 1 of 1 cycle, Start date: --, End date: -- temozolomide (TEMODAR) 100 MG capsule, 200 mg/m2/day = 300 mg, Oral, Daily, 1 of 1 cycle, Start date: --, End date: -- temozolomide (TEMODAR) 140 MG capsule, 150 mg/m2/day = 280 mg, Oral, Daily, 1 of 1 cycle, Start date: 10/15/2019, End date: 10/20/2019  for chemotherapy treatment.      Biomarkers:  MGMT Unknown.  IDH 1/2 Mutated.  EGFR Unknown  1p/19q co-deleted   Interval History:  BBranko Beard presents today for follow up, having just initiated first cycle of 5-day TMZ on 11/05/19 after issues obtaining drug.  Today he describes episodes of "funny sensation, migrating down body".  They started several days ago, frequency is unclear.  He has not been compliant with Lamictal in a consistent manner, especially recently.  Sleep has been very poor due to stress and anxiety.  Visual field impairment is stable.  Continues to describe auditory hallucinations, finally has appt scheduled  with outpatient psych in October.  H+P (07/19/19) Patient presented to medical attention with several months of progressive headaches and left sided visual impairment.  This was accompanied by episodes of deja-vu +paroxysmal burning smells which would occur almost daily over that time.  CNS imaging demonstrated large parieto-occipital mass with herniation syndrome.  Mass was resected/debulked, and post-operative course was complicated by intracranial hematoma and also respiratory failure.  At present, he is walking and talking, carries left sided visual impairment.  Currently on antibiotics for wound infection per Dr. OZada Finders  Not requiring steroids.  Currently on lamictal 559mdaily.  Medications: Current Outpatient Medications on File Prior to Visit  Medication Sig Dispense Refill  . ARIPiprazole (ABILIFY) 5 MG tablet Take 1 tablet (5 mg total) by mouth daily. (Patient not taking: Reported on 10/15/2019) 30 tablet 1  . Docusate Sodium (DSS) 100 MG CAPS Take by mouth. (Patient not taking: Reported on 10/15/2019)    . lamoTRIgine (LAMICTAL) 100 MG tablet Take 1 tablet (100 mg total) by mouth daily. Take 1 tablet 10069maily in AM 30 tablet 3  . ondansetron (ZOFRAN) 8 MG tablet Take 1 tablet (8 mg total) by mouth 2 (two) times daily as needed (nausea and vomiting). May take 30-60 minutes prior to Temodar administration if nausea/vomiting occurs. 30 tablet 1  . oxyCODONE-acetaminophen (PERCOCET/ROXICET) 5-325 MG tablet Take by mouth every 4 (four) hours as needed for severe pain. (Patient not taking: Reported on 10/15/2019)    . senna (SENOKOT) 8.6 MG TABS tablet Take 1 tablet (8.6 mg total) by mouth daily. (Patient not  taking: Reported on 10/15/2019) 60 tablet 2   No current facility-administered medications on file prior to visit.    Allergies: No Known Allergies Past Medical History:  Past Medical History:  Diagnosis Date  . ADHD    Past Surgical History:  Past Surgical History:  Procedure  Laterality Date  . APPLICATION OF CRANIAL NAVIGATION N/A 07/02/2019   Procedure: APPLICATION OF CRANIAL NAVIGATION;  Surgeon: Judith Part, MD;  Location: [facility];  Service: Neurosurgery;  Laterality: N/A;  . CRANIOTOMY N/A 07/03/2019   Procedure: CRANIOTOMY HEMATOMA EVACUATION SUBDURAL;  Surgeon: Judith Part, MD;  Location: [facility];  Service: Neurosurgery;  Laterality: N/A;  . CRANIOTOMY Right 07/03/2019   Procedure: CRANIOTOMY HEMATOMA EVACUATION SUBDURAL;  Surgeon: Judith Part, MD;  Location: [facility];  Service: Neurosurgery;  Laterality: Right;  . CRANIOTOMY Right 07/02/2019   Procedure: CRANIOTOMY TUMOR EXCISION with Lucky Rathke;  Surgeon: Judith Part, MD;  Location: [facility];  Service: Neurosurgery;  Laterality: Right;  posterior   Social History:  Social History   Socioeconomic History  . Marital status: Single    Spouse name: Not on file  . Number of children: Not on file  . Years of education: Not on file  . Highest education level: Not on file  Occupational History  . Not on file  Tobacco Use  . Smoking status: Current Every Day Smoker  . Smokeless tobacco: Current User    Types: Chew  Substance and Sexual Activity  . Alcohol use: Yes  . Drug use: No  . Sexual activity: Not on file  Other Topics Concern  . Not on file  Social History Narrative  . Not on file   Social Determinants of Health   Financial Resource Strain:   . Difficulty of Paying Living Expenses: Not on file  Food Insecurity:   . Worried About Charity fundraiser in the Last Year: Not on file  . Ran Out of Food in the Last Year: Not on file  Transportation Needs:   . Lack of Transportation (Medical): Not on file  . Lack of Transportation (Non-Medical): Not on file  Physical Activity:   . Days of Exercise per Week: Not on file  . Minutes of Exercise per Session: Not on file  Stress:   . Feeling of Stress : Not on file  Social Connections:   . Frequency of Communication with Friends  and Family: Not on file  . Frequency of Social Gatherings with Friends and Family: Not on file  . Attends Religious Services: Not on file  . Active Member of Clubs or Organizations: Not on file  . Attends Archivist Meetings: Not on file  . Marital Status: Not on file  Intimate Partner Violence:   . Fear of Current or Ex-Partner: Not on file  . Emotionally Abused: Not on file  . Physically Abused: Not on file  . Sexually Abused: Not on file   Family History: No family history on file.  Review of Systems: Constitutional: Doesn't report fevers, chills or abnormal weight loss Eyes: Doesn't report blurriness of vision Ears, nose, mouth, throat, and face: Doesn't report sore throat Respiratory: Doesn't report cough, dyspnea or wheezes Cardiovascular: Doesn't report palpitation, chest discomfort  Gastrointestinal:  Doesn't report nausea, constipation, diarrhea GU: Doesn't report incontinence Skin: Doesn't report skin rashes Neurological: Per HPI Musculoskeletal: Doesn't report joint pain Behavioral/Psych: Doesn't report anxiety  Physical Exam: Vitals:   11/07/19 1059  BP: 116/77  Pulse: 92  Resp: 18  Temp: 98.2 F (36.8 C)  SpO2: 100%   KPS: 80. General: Alert, cooperative, pleasant, in no acute distress Head: Normal EENT: No conjunctival injection or scleral icterus.  Lungs: Resp effort normal Cardiac: Regular rate Abdomen: Non-distended abdomen Skin: No rashes cyanosis or petechiae. Extremities: No clubbing or edema  Neurologic Exam: Mental Status: Awake, alert, attentive to examiner. Oriented to self and environment. Language is fluent with intact comprehension.  Cranial Nerves: Visual acuity is grossly normal. Left homonymous hemianopia. Extra-ocular movements intact. No ptosis. Face is symmetric Motor: Tone and bulk are normal. Power is full in both arms and legs. Reflexes are symmetric, no pathologic reflexes present.  Sensory: Intact to light touch Gait:  Sensory dystaxia   Labs: I have reviewed the data as listed    Component Value Date/Time   NA 140 09/17/2019 1508   K 4.8 09/17/2019 1508   CL 104 09/17/2019 1508   CO2 24 09/17/2019 1508   GLUCOSE 97 09/17/2019 1508   BUN 14 09/17/2019 1508   CREATININE 0.95 09/17/2019 1508   CALCIUM 9.8 09/17/2019 1508   PROT 7.5 09/17/2019 1508   ALBUMIN 4.4 09/17/2019 1508   AST 21 09/17/2019 1508   ALT 22 09/17/2019 1508   ALKPHOS 75 09/17/2019 1508   BILITOT 0.3 09/17/2019 1508   GFRNONAA >60 09/17/2019 1508   GFRAA >60 09/17/2019 1508   Lab Results  Component Value Date   WBC 6.4 09/17/2019   NEUTROABS 4.8 09/17/2019   HGB 15.3 09/17/2019   HCT 45.9 09/17/2019   MCV 89.8 09/17/2019   PLT 205 09/17/2019     Assessment/Plan 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HCC) [C71.4]   Stephen Beard presents today with clinical changes which may reflect breakthrough focal seizures.  This is not surprising in the setting of poor AED compliance and recent sleep impairments.    We recommended increasing Lamictal to 173m BID and stressed importance of compliance.  For sleep, we will offer Remeron 165mHS and can uptitrate from there if needed.  We recommended continuing treatment with cycle #1 Temozolomide 15089m2, on for five days and off for twenty three days in twenty eight day cycles. The patient will have a complete blood count performed on days 21 and 28 of each cycle, and a comprehensive metabolic panel performed on day 28 of each cycle. Labs may need to be performed more often. Zofran will prescribed for home use for nausea/vomiting.   Chemotherapy should be held for the following:  ANC less than 1,000  Platelets less than 100,000  LFT or creatinine greater than 2x ULN  If clinical concerns/contraindications develop  Finally arranged appt with psychiatry to address ongoing auditory hallucinations which have not abated.  We ask that Stephen Iovinoidgers return to clinic in 1  months prior to cycle #2, or sooner as needed.  All questions were answered. The patient knows to call the clinic with any problems, questions or concerns. No barriers to learning were detected.  I have spent a total of 30 minutes of face-to-face and non-face-to-face time, excluding clinical staff time, preparing to see patient, ordering tests and/or medications, counseling the patient, and independently interpreting results and communicating results to the patient/family/caregiver    ZacVentura SellersD Medical Director of Neuro-Oncology [facility] [place]/26/21 10:57 AM

## 2019-11-08 ENCOUNTER — Telehealth: Payer: Self-pay | Admitting: Internal Medicine

## 2019-11-08 NOTE — Telephone Encounter (Signed)
Scheduled per 8/26 los. Pt is aware of appt time and date.

## 2019-11-11 ENCOUNTER — Other Ambulatory Visit: Payer: Self-pay | Admitting: Internal Medicine

## 2019-11-11 MED ORDER — LAMOTRIGINE 100 MG PO TABS: ORAL_TABLET | ORAL | 3 refills | Status: DC

## 2019-11-25 ENCOUNTER — Other Ambulatory Visit: Payer: Self-pay | Admitting: Internal Medicine

## 2019-11-25 DIAGNOSIS — C714 Malignant neoplasm of occipital lobe: Secondary | ICD-10-CM

## 2019-11-26 ENCOUNTER — Telehealth: Payer: Self-pay

## 2019-11-26 NOTE — Telephone Encounter (Signed)
Received TC from patient wanting to know when his next round of chemo starts. I let pt know that per Dr Mickeal Skinner it starts on 12/05/19.also let him know that he has a lab appointment @12p , and doctors appointment with Dr Mickeal Skinner @12 :30pm on 12/05/19 as well. Patient verbalized understanding and appreciated the call back.

## 2019-12-05 ENCOUNTER — Inpatient Hospital Stay: Payer: Medicaid Other | Attending: Internal Medicine | Admitting: Internal Medicine

## 2019-12-05 ENCOUNTER — Inpatient Hospital Stay: Payer: Medicaid Other

## 2019-12-05 ENCOUNTER — Other Ambulatory Visit: Payer: Self-pay

## 2019-12-05 VITALS — BP 117/82 | HR 76 | Temp 97.1°F | Resp 17 | Ht 67.0 in | Wt 139.7 lb

## 2019-12-05 DIAGNOSIS — H538 Other visual disturbances: Secondary | ICD-10-CM | POA: Diagnosis not present

## 2019-12-05 DIAGNOSIS — G4700 Insomnia, unspecified: Secondary | ICD-10-CM | POA: Diagnosis not present

## 2019-12-05 DIAGNOSIS — R440 Auditory hallucinations: Secondary | ICD-10-CM | POA: Insufficient documentation

## 2019-12-05 DIAGNOSIS — R519 Headache, unspecified: Secondary | ICD-10-CM | POA: Insufficient documentation

## 2019-12-05 DIAGNOSIS — R112 Nausea with vomiting, unspecified: Secondary | ICD-10-CM | POA: Diagnosis not present

## 2019-12-05 DIAGNOSIS — F17210 Nicotine dependence, cigarettes, uncomplicated: Secondary | ICD-10-CM | POA: Diagnosis not present

## 2019-12-05 DIAGNOSIS — Z79899 Other long term (current) drug therapy: Secondary | ICD-10-CM | POA: Diagnosis not present

## 2019-12-05 DIAGNOSIS — C714 Malignant neoplasm of occipital lobe: Secondary | ICD-10-CM | POA: Insufficient documentation

## 2019-12-05 DIAGNOSIS — R569 Unspecified convulsions: Secondary | ICD-10-CM | POA: Diagnosis not present

## 2019-12-05 LAB — CBC WITH DIFFERENTIAL (CANCER CENTER ONLY)
Abs Immature Granulocytes: 0.02 10*3/uL (ref 0.00–0.07)
Basophils Absolute: 0 10*3/uL (ref 0.0–0.1)
Basophils Relative: 0 %
Eosinophils Absolute: 0.1 10*3/uL (ref 0.0–0.5)
Eosinophils Relative: 1 %
HCT: 46.9 % (ref 39.0–52.0)
Hemoglobin: 16.3 g/dL (ref 13.0–17.0)
Immature Granulocytes: 0 %
Lymphocytes Relative: 22 %
Lymphs Abs: 1.6 10*3/uL (ref 0.7–4.0)
MCH: 31.7 pg (ref 26.0–34.0)
MCHC: 34.8 g/dL (ref 30.0–36.0)
MCV: 91.1 fL (ref 80.0–100.0)
Monocytes Absolute: 0.6 10*3/uL (ref 0.1–1.0)
Monocytes Relative: 9 %
Neutro Abs: 5 10*3/uL (ref 1.7–7.7)
Neutrophils Relative %: 68 %
Platelet Count: 188 10*3/uL (ref 150–400)
RBC: 5.15 MIL/uL (ref 4.22–5.81)
RDW: 12.3 % (ref 11.5–15.5)
WBC Count: 7.2 10*3/uL (ref 4.0–10.5)
nRBC: 0 % (ref 0.0–0.2)

## 2019-12-05 LAB — CMP (CANCER CENTER ONLY)
ALT: 35 U/L (ref 0–44)
AST: 24 U/L (ref 15–41)
Albumin: 4.3 g/dL (ref 3.5–5.0)
Alkaline Phosphatase: 78 U/L (ref 38–126)
Anion gap: 10 (ref 5–15)
BUN: 17 mg/dL (ref 6–20)
CO2: 25 mmol/L (ref 22–32)
Calcium: 9.4 mg/dL (ref 8.9–10.3)
Chloride: 103 mmol/L (ref 98–111)
Creatinine: 0.9 mg/dL (ref 0.61–1.24)
GFR, Est AFR Am: >60 mL/min (ref >60)
GFR, Estimated: >60 mL/min (ref >60)
Glucose, Bld: 94 mg/dL (ref 70–99)
Potassium: 4.1 mmol/L (ref 3.5–5.1)
Sodium: 138 mmol/L (ref 135–145)
Total Bilirubin: 0.3 mg/dL (ref 0.3–1.2)
Total Protein: 7.2 g/dL (ref 6.5–8.1)

## 2019-12-05 MED ORDER — ONDANSETRON HCL 8 MG PO TABS: 8.0000 mg | ORAL_TABLET | Freq: Two times a day (BID) | ORAL | 1 refills | Status: DC | PRN

## 2019-12-05 MED ORDER — TEMOZOLOMIDE 140 MG PO CAPS: 140.0000 mg | ORAL_CAPSULE | Freq: Every day | ORAL | 0 refills | Status: AC

## 2019-12-05 MED ORDER — TEMOZOLOMIDE 100 MG PO CAPS: 200.0000 mg | ORAL_CAPSULE | Freq: Every day | ORAL | 0 refills | Status: AC

## 2019-12-05 MED FILL — TEMOZOLOMIDE 140 MG CAPS: 140 | 28 days supply | Qty: 5 | Fill #0

## 2019-12-05 MED FILL — ONDANSETRON HCL 8 MG TABLET: 8 | 30 days supply | Qty: 30 | Fill #0

## 2019-12-05 MED FILL — TEMOZOLOMIDE 100 MG CAPS: 100 | 28 days supply | Qty: 10 | Fill #0

## 2019-12-05 NOTE — Progress Notes (Signed)
Stephen Beard, Stephen Beard [id number] [phone]   Interval Evaluation  Date of Service: 12/05/19 Patient Name: Stephen Beard Patient MRN: [id number] Patient DOB: July 04, 1995 Provider: [name], MD  Identifying Statement:  Stephen Beard is a 24 y.o. 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 Zada Finders. Followed by hematoma evacuation on 07/03/19.   08/05/2019 - 09/17/2019 Radiation Therapy   IMRT with concurrent Temodar 64m/m2 daily   10/15/2019 -  Chemotherapy   The patient had dexamethasone (DECADRON) 4 MG tablet, 1 of 1 cycle, Start date: --, End date: -- temozolomide (TEMODAR) 5 MG capsule, 200 mg/m2/day = 340 mg, Oral, Daily, 1 of 1 cycle, Start date: --, End date: -- temozolomide (TEMODAR) 20 MG capsule, 200 mg/m2/day = 340 mg, Oral, Daily, 1 of 1 cycle, Start date: --, End date: -- temozolomide (TEMODAR) 100 MG capsule, 200 mg/m2/day = 300 mg, Oral, Daily, 1 of 1 cycle, Start date: --, End date: -- temozolomide (TEMODAR) 140 MG capsule, 150 mg/m2/day = 280 mg, Oral, Daily, 1 of 1 cycle, Start date: 10/15/2019, End date: 10/20/2019  for chemotherapy treatment.      Biomarkers:  MGMT Unknown.  IDH 1/2 Mutated.  EGFR Unknown  1p/19q co-deleted   Interval History:  Stephen Beard presents today for follow up after completing cycle #1 5-day TMZ.  He has experienced a few seizure "auras" but only when has forgotten his Lamictal.  Nausea was a problem during the dosing of Temodar, but again only with non-compliance with Zofran. Visual field impairment is stable.  Continues to describe auditory hallucinations, finally has appt scheduled with outpatient psych in October.  H+P (07/19/19) Patient presented to medical attention with several months of progressive headaches  and left sided visual impairment.  This was accompanied by episodes of deja-vu +paroxysmal burning smells which would occur almost daily over that time.  CNS imaging demonstrated large parieto-occipital mass with herniation syndrome.  Mass was resected/debulked, and post-operative course was complicated by intracranial hematoma and also respiratory failure.  At present, he is walking and talking, carries left sided visual impairment.  Currently on antibiotics for wound infection per Dr. OZada Finders  Not requiring steroids.  Currently on lamictal 512mdaily.  Medications: Current Outpatient Medications on File Prior to Visit  Medication Sig Dispense Refill  . lamoTRIgine (LAMICTAL) 100 MG tablet Take #2 10058mablet in AM and #1 tablet 100m51m PM 90 tablet 3  . mirtazapine (REMERON) 15 MG tablet Take 1 tablet (15 mg total) by mouth at bedtime. 30 tablet 3  . Multiple Vitamin (MULTIVITAMIN) capsule Take 1 capsule by mouth daily.    . ARIPiprazole (ABILIFY) 5 MG tablet Take 1 tablet (5 mg total) by mouth daily. (Patient not taking: Reported on 10/15/2019) 30 tablet 1  . Docusate Sodium (DSS) 100 MG CAPS Take by mouth. (Patient not taking: Reported on 10/15/2019)    . ondansetron (ZOFRAN) 8 MG tablet Take 1 tablet (8 mg total) by mouth 2 (two) times daily as needed (nausea and vomiting). May take 30-60 minutes prior to Temodar administration if nausea/vomiting occurs. (Patient not taking: Reported on 11/07/2019) 30 tablet 1  . oxyCODONE-acetaminophen (PERCOCET/ROXICET) 5-325 MG tablet Take by mouth every 4 (four) hours as needed for severe pain. (Patient not taking: Reported on 10/15/2019)    . senna (SENOKOT) 8.6  MG TABS tablet Take 1 tablet (8.6 mg total) by mouth daily. (Patient not taking: Reported on 10/15/2019) 60 tablet 2   No current facility-administered medications on file prior to visit.    Allergies: No Known Allergies Past Medical History:  Past Medical History:  Diagnosis Date  . ADHD    Past  Surgical History:  Past Surgical History:  Procedure Laterality Date  . APPLICATION OF CRANIAL NAVIGATION N/A 07/02/2019   Procedure: APPLICATION OF CRANIAL NAVIGATION;  Surgeon: Judith Part, MD;  Location: [facility];  Service: Neurosurgery;  Laterality: N/A;  . CRANIOTOMY N/A 07/03/2019   Procedure: CRANIOTOMY HEMATOMA EVACUATION SUBDURAL;  Surgeon: Judith Part, MD;  Location: [facility];  Service: Neurosurgery;  Laterality: N/A;  . CRANIOTOMY Right 07/03/2019   Procedure: CRANIOTOMY HEMATOMA EVACUATION SUBDURAL;  Surgeon: Judith Part, MD;  Location: [facility];  Service: Neurosurgery;  Laterality: Right;  . CRANIOTOMY Right 07/02/2019   Procedure: CRANIOTOMY TUMOR EXCISION with Lucky Rathke;  Surgeon: Judith Part, MD;  Location: [facility];  Service: Neurosurgery;  Laterality: Right;  posterior   Social History:  Social History   Socioeconomic History  . Marital status: Single    Spouse name: Not on file  . Number of children: Not on file  . Years of education: Not on file  . Highest education level: Not on file  Occupational History  . Not on file  Tobacco Use  . Smoking status: Current Every Day Smoker  . Smokeless tobacco: Current User    Types: Chew  Substance and Sexual Activity  . Alcohol use: Yes  . Drug use: No  . Sexual activity: Not on file  Other Topics Concern  . Not on file  Social History Narrative  . Not on file   Social Determinants of Health   Financial Resource Strain:   . Difficulty of Paying Living Expenses: Not on file  Food Insecurity:   . Worried About Charity fundraiser in the Last Year: Not on file  . Ran Out of Food in the Last Year: Not on file  Transportation Needs:   . Lack of Transportation (Medical): Not on file  . Lack of Transportation (Non-Medical): Not on file  Physical Activity:   . Days of Exercise per Week: Not on file  . Minutes of Exercise per Session: Not on file  Stress:   . Feeling of Stress : Not on file  Social  Connections:   . Frequency of Communication with Friends and Family: Not on file  . Frequency of Social Gatherings with Friends and Family: Not on file  . Attends Religious Services: Not on file  . Active Member of Clubs or Organizations: Not on file  . Attends Archivist Meetings: Not on file  . Marital Status: Not on file  Intimate Partner Violence:   . Fear of Current or Ex-Partner: Not on file  . Emotionally Abused: Not on file  . Physically Abused: Not on file  . Sexually Abused: Not on file   Family History: No family history on file.  Review of Systems: Constitutional: Doesn't report fevers, chills or abnormal weight loss Eyes: Doesn't report blurriness of vision Ears, nose, mouth, throat, and face: Doesn't report sore throat Respiratory: Doesn't report cough, dyspnea or wheezes Cardiovascular: Doesn't report palpitation, chest discomfort  Gastrointestinal:  Doesn't report nausea, constipation, diarrhea GU: Doesn't report incontinence Skin: Doesn't report skin rashes Neurological: Per HPI Musculoskeletal: Doesn't report joint pain Behavioral/Psych: Doesn't report anxiety  Physical Exam:  Vitals:   12/05/19 1218  BP: 117/82  Pulse: 76  Resp: 17  Temp: (!) 97.1 F (36.2 C)  SpO2: 100%   KPS: 80. General: Alert, cooperative, pleasant, in no acute distress Head: Normal EENT: No conjunctival injection or scleral icterus.  Lungs: Resp effort normal Cardiac: Regular rate Abdomen: Non-distended abdomen Skin: No rashes cyanosis or petechiae. Extremities: No clubbing or edema  Neurologic Exam: Mental Status: Awake, alert, attentive to examiner. Oriented to self and environment. Language is fluent with intact comprehension.  Cranial Nerves: Visual acuity is grossly normal. Left homonymous hemianopia. Extra-ocular movements intact. No ptosis. Face is symmetric Motor: Tone and bulk are normal. Power is full in both arms and legs. Reflexes are symmetric, no  pathologic reflexes present.  Sensory: Intact to light touch Gait: Sensory dystaxia   Labs: I have reviewed the data as listed    Component Value Date/Time   NA 138 12/05/2019 1206   K 4.1 12/05/2019 1206   CL 103 12/05/2019 1206   CO2 25 12/05/2019 1206   GLUCOSE 94 12/05/2019 1206   BUN 17 12/05/2019 1206   CREATININE 0.90 12/05/2019 1206   CALCIUM 9.4 12/05/2019 1206   PROT 7.2 12/05/2019 1206   ALBUMIN 4.3 12/05/2019 1206   AST 24 12/05/2019 1206   ALT 35 12/05/2019 1206   ALKPHOS 78 12/05/2019 1206   BILITOT 0.3 12/05/2019 1206   GFRNONAA >60 12/05/2019 1206   GFRAA >60 12/05/2019 1206   Lab Results  Component Value Date   WBC 7.2 12/05/2019   NEUTROABS 5.0 12/05/2019   HGB 16.3 12/05/2019   HCT 46.9 12/05/2019   MCV 91.1 12/05/2019   PLT 188 12/05/2019     Assessment/Plan 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HCC) [C71.4]   Nicandro L Licciardi is clinically stable today, now having completed first cycle of 5-day Temozolomide.   We recommended continuing treatment with cycle #2 Temozolomide, dose increased to 261m/m2, on for five days and off for twenty three days in twenty eight day cycles. The patient will have a complete blood count performed on days 21 and 28 of each cycle, and a comprehensive metabolic panel performed on day 28 of each cycle. Labs may need to be performed more often. Zofran will prescribed for home use for nausea/vomiting.   Chemotherapy should be held for the following:  ANC less than 1,000  Platelets less than 100,000  LFT or creatinine greater than 2x ULN  If clinical concerns/contraindications develop  We recommended continuing Lamictal 1065mBID.  May con't Remeron 1538mS for insomnia.  Has upcoming appt with psychiatry to address halluncinations.  We ask that BraKenyatta Keidelidgers return to clinic in 1 months prior to cycle #2, with MRI brain for evaluation.  All questions were answered. The patient knows to call the clinic  with any problems, questions or concerns. No barriers to learning were detected.  I have spent a total of 30 minutes of face-to-face and non-face-to-face time, excluding clinical staff time, preparing to see patient, ordering tests and/or medications, counseling the patient, and independently interpreting results and communicating results to the patient/family/caregiver    ZacVentura SellersD Medical Director of Neuro-Oncology [facility] [place]/23/21 12:48 PM

## 2019-12-06 ENCOUNTER — Telehealth: Payer: Self-pay | Admitting: Internal Medicine

## 2019-12-06 NOTE — Telephone Encounter (Signed)
Scheduled per 9/23 los. Pt is aware of appt times and date

## 2019-12-12 ENCOUNTER — Other Ambulatory Visit: Payer: Self-pay

## 2019-12-12 ENCOUNTER — Ambulatory Visit (HOSPITAL_COMMUNITY)
Admission: EM | Admit: 2019-12-12 | Discharge: 2019-12-12 | Disposition: A | Discharge status: Home / Self Care | Payer: Medicaid Other | Attending: Physician Assistant | Admitting: Physician Assistant

## 2019-12-12 ENCOUNTER — Ambulatory Visit (HOSPITAL_COMMUNITY)
Admission: RE | Admit: 2019-12-12 | Discharge: 2019-12-12 | Disposition: A | Discharge status: Home / Self Care | Payer: Medicaid Other | Source: Ambulatory Visit | Attending: Internal Medicine | Admitting: Internal Medicine

## 2019-12-12 DIAGNOSIS — C714 Malignant neoplasm of occipital lobe: Secondary | ICD-10-CM | POA: Insufficient documentation

## 2019-12-12 DIAGNOSIS — F29 Unspecified psychosis not due to a substance or known physiological condition: Secondary | ICD-10-CM | POA: Insufficient documentation

## 2019-12-12 IMAGING — MR MR HEAD WO/W CM
16 series · 48 of 48 positions shown · IV contrast (gadavist)
Comparison: [DATE]

CLINICAL DATA: Frontal headaches. History of brain tumor status
post resection.

EXAM:
MRI HEAD WITHOUT AND WITH CONTRAST
TECHNIQUE: Multiplanar, multiecho pulse sequences of the brain and surrounding
structures were obtained without and with intravenous contrast.
CONTRAST:  6mL GADAVIST GADOBUTROL 1 MMOL/ML IV SOLN

[Series 9: DWI · axial · 3.0mm · 1.31mm/px · z∈[-9,+149]mm · 4 of 108 slices shown (1 of 4)]
[im 1/108]
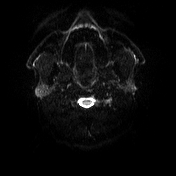
[im 36/108]
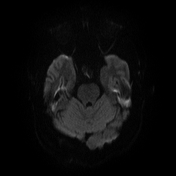
[im 72/108]
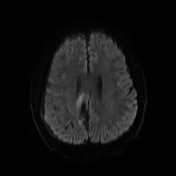
[im 108/108]
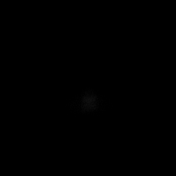

[Series 10: DWI · axial · 3.0mm · 1.31mm/px · z∈[-9,+149]mm · 2 of 53 slices shown (2 of 4)]
[im 1/53]
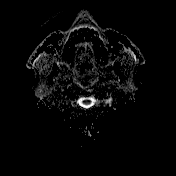
[im 53/53]
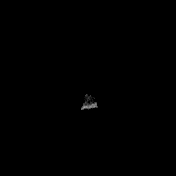

[Series 11: mip_images(sw) · axial · 24.0mm · 0.66mm/px · z∈[-7,+136]mm · 2 of 49 slices shown]
[im 1/49]
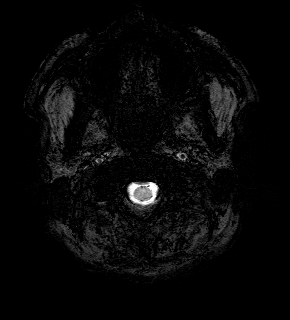
[im 49/49]
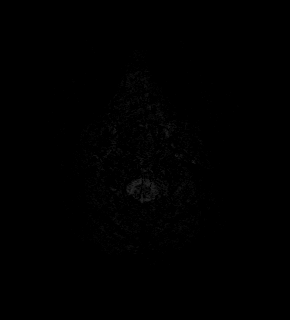

[Series 12: swi_images · axial · 3.0mm · 0.66mm/px · z∈[-17,+147]mm · 2 of 56 slices shown]
[im 1/56]
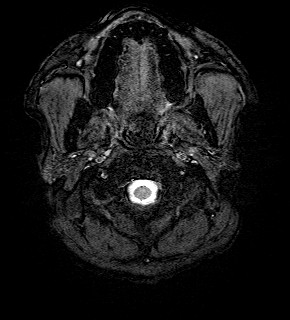
[im 56/56]
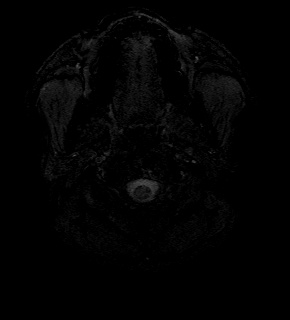

[Series 13: FLAIR · axial · 3.0mm · 0.66mm/px · z∈[-12,+146]mm · 2 of 54 slices shown]
[im 1/54]
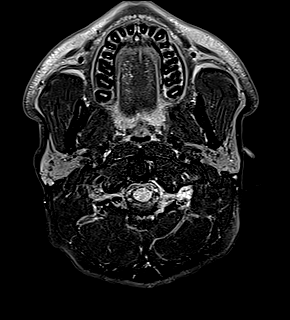
[im 54/54]
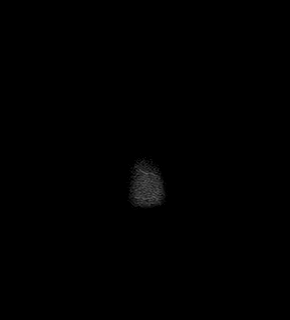

[Series 14: DWI · coronal · 5.0mm · 1.31mm/px · 3 of 76 slices shown (3 of 4)]
[im 1/76]
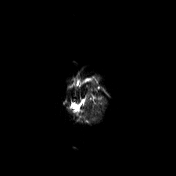
[im 38/76]
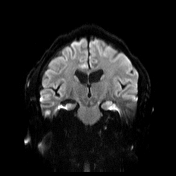
[im 76/76]
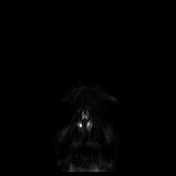

[Series 15: DWI · coronal · 5.0mm · 1.31mm/px · 1 of 38 slices shown (4 of 4)]
[im 1/38]
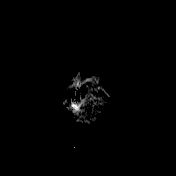

[Series 16: T1 · axial · 1.0mm · 0.82mm/px · z∈[-21,+153]mm · 7 of 176 slices shown (1 of 2)]
[im 1/176]
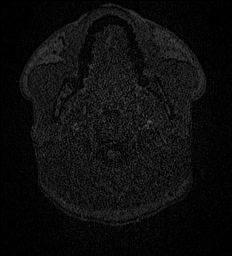
[im 30/176]
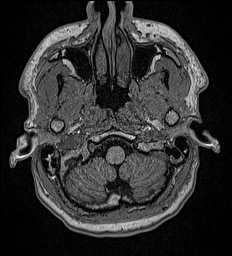
[im 59/176]
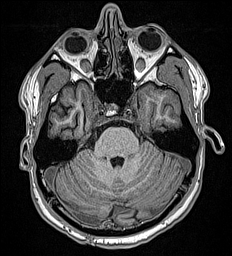
[im 88/176]
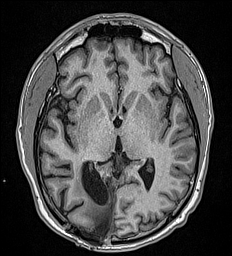
[im 117/176]
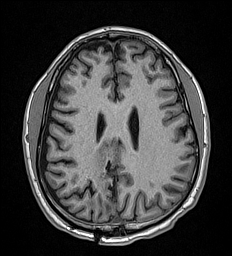
[im 146/176]
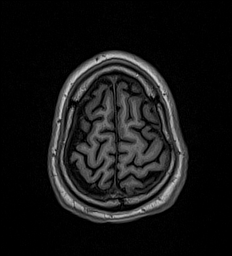
[im 176/176]
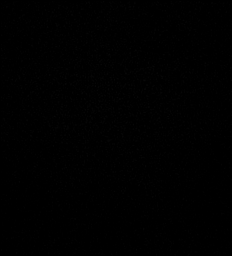

[Series 17: T1 · sagittal · 5.0mm · 0.75mm/px · 1 of 27 slices shown (2 of 2)]
[im 1/27]
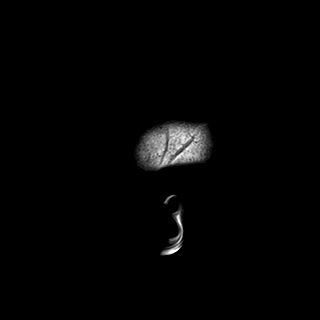

[Series 18: T2 · axial · 5.0mm · 0.55mm/px · 1 of 26 slices shown]
[im 1/26]
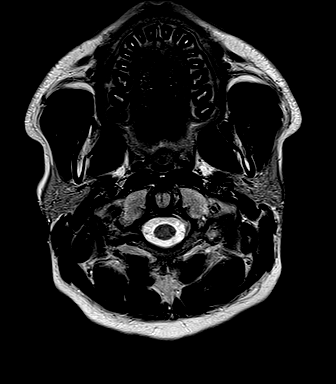

[Series 19: T2 post-contrast · coronal · 5.0mm · 0.57mm/px · 1 of 30 slices shown]
[im 1/30]
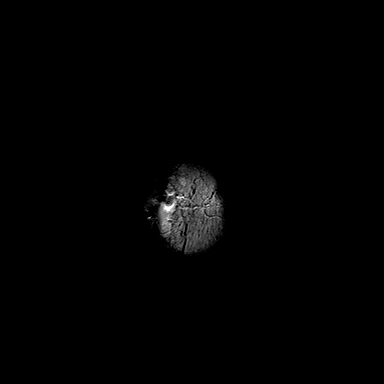

[Series 20: T1 post-contrast · axial · 1.0mm · 0.82mm/px · z∈[-21,+153]mm · 7 of 176 slices shown (1 of 3)]
[im 1/176]
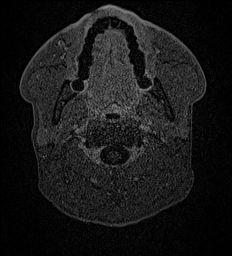
[im 30/176]
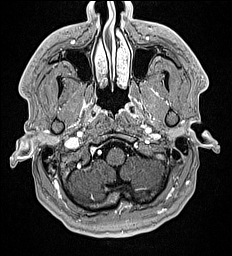
[im 59/176]
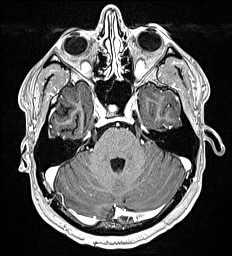
[im 88/176]
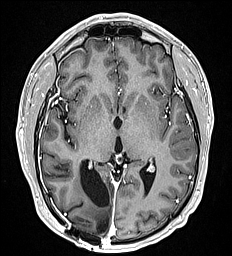
[im 117/176]
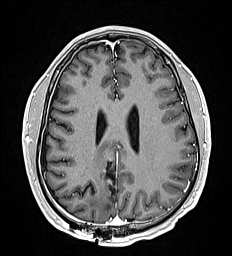
[im 146/176]
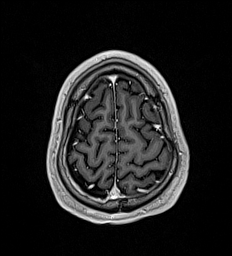
[im 176/176]
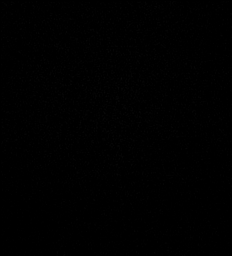

[Series 21: T1 post-contrast · coronal · 5.0mm · 0.43mm/px · 1 of 30 slices shown (2 of 3)]
[im 1/30]
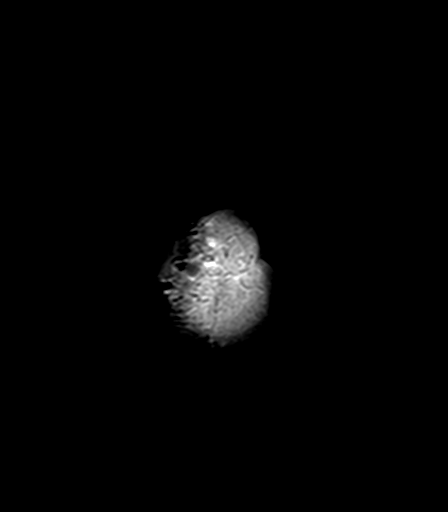

[Series 22: T1 post-contrast · sagittal · 5.0mm · 0.75mm/px · 1 of 27 slices shown (3 of 3)]
[im 1/27]
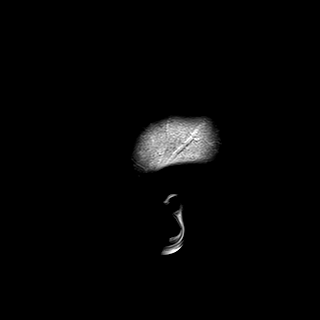

[Series 100: <mpr range> · axial · 1.0mm · 0.43mm/px · z∈[+66,+157]mm · 7 of 177 slices shown]
[im 1/177]
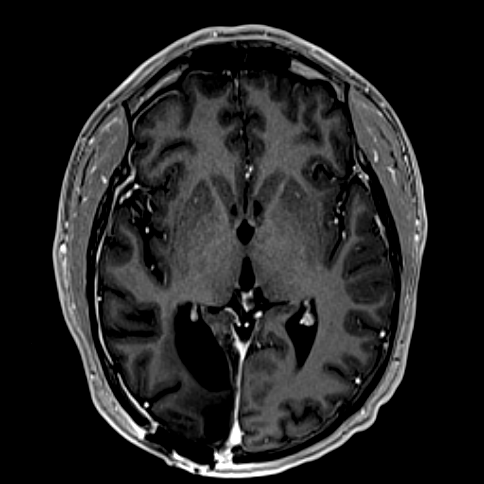
[im 30/177]
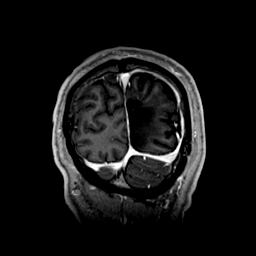
[im 59/177]
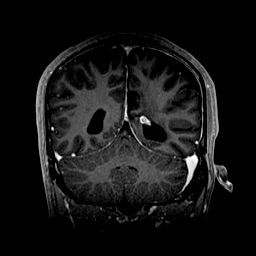
[im 89/177]
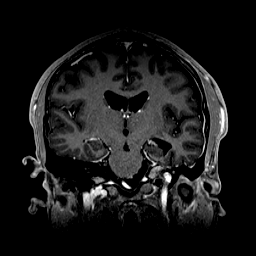
[im 118/177]
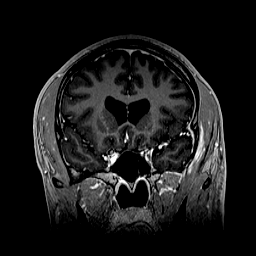
[im 147/177]
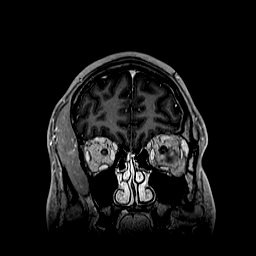
[im 177/177]
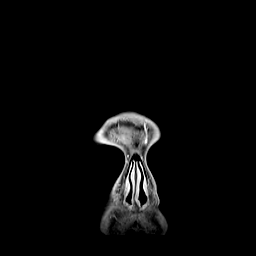

[Series 101: <mpr range(1)> · axial · 1.0mm · 0.43mm/px · z∈[+66,+169]mm · 6 of 172 slices shown]
[im 1/172]
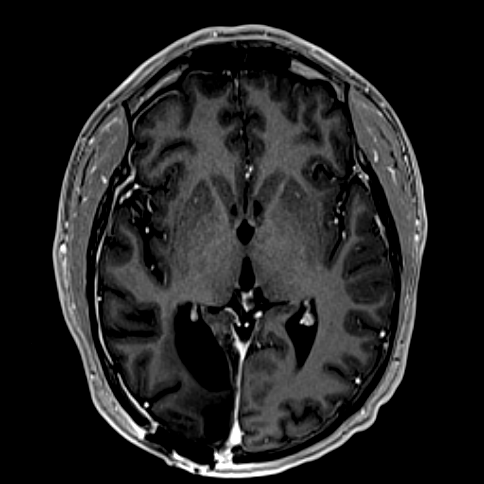
[im 35/172]
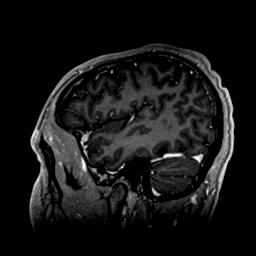
[im 69/172]
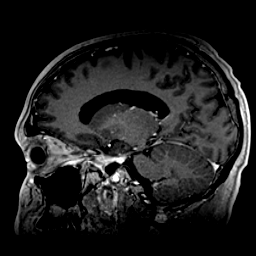
[im 103/172]
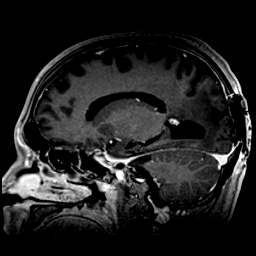
[im 137/172]
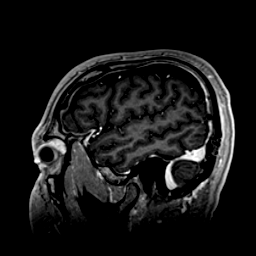
[im 172/172]
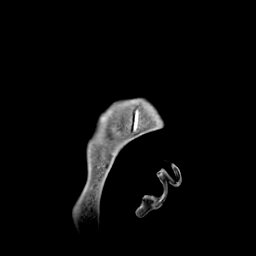

[48 of 48 positions shown; findings below may reference images not displayed]

FINDINGS: Brain: Unchanged appearance posteromedial right hemisphere
oligodendroglioma resection cavity. The area hyperintense
T2-weighted signal adjacent to the resection site is unchanged. No
acute ischemia. No acute hemorrhage. There is ex vacuo dilatation of
the temporal and occipital horns of the right lateral ventricle.
Unchanged distribution of old blood products. Previously seen small
focus of contrast enhancement adjacent to the atrium of the right
lateral ventricle has decreased in size.

Vascular: Normal flow voids.

Skull and upper cervical spine: Remote right posterior craniotomy.

Sinuses/Orbits: Negative.

Other: None.
IMPRESSION: Decreased size of small focus of contrast enhancement adjacent to
the atrium of the right lateral ventricle, favored to be
postoperative.

## 2019-12-12 MED ORDER — QUETIAPINE FUMARATE 50 MG PO TABS: 50.0000 mg | ORAL_TABLET | Freq: Every day | ORAL | 0 refills | Status: DC

## 2019-12-12 MED ORDER — GADOBUTROL 1 MMOL/ML IV SOLN
6.0000 mL | Freq: Once | INTRAVENOUS | Status: AC | PRN
  Administered 2019-12-12: 6 mL via INTRAVENOUS

## 2019-12-12 NOTE — Discharge Instructions (Signed)
Resources provided to the patient upon discharge.  Patient advised to keep all scheduled appointments with [facility].
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
## 2019-12-12 NOTE — ED Notes (Signed)
Mother in waiting area
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
## 2019-12-13 DIAGNOSIS — T50902A Poisoning by unspecified drugs, medicaments and biological substances, intentional self-harm, initial encounter: Secondary | ICD-10-CM

## 2019-12-13 HISTORY — DX: Poisoning by unspecified drugs, medicaments and biological substances, intentional self-harm, initial encounter: T50.902A

## 2019-12-25 ENCOUNTER — Other Ambulatory Visit: Payer: Self-pay | Admitting: Internal Medicine

## 2019-12-25 DIAGNOSIS — C714 Malignant neoplasm of occipital lobe: Secondary | ICD-10-CM

## 2020-01-01 ENCOUNTER — Other Ambulatory Visit: Payer: Self-pay

## 2020-01-01 ENCOUNTER — Ambulatory Visit (INDEPENDENT_AMBULATORY_CARE_PROVIDER_SITE_OTHER): Payer: Medicaid Other | Admitting: Psychiatry

## 2020-01-01 ENCOUNTER — Encounter (HOSPITAL_COMMUNITY): Payer: Self-pay | Admitting: Psychiatry

## 2020-01-01 VITALS — BP 125/90 | HR 102 | Ht 67.0 in | Wt 141.5 lb

## 2020-01-01 DIAGNOSIS — F251 Schizoaffective disorder, depressive type: Secondary | ICD-10-CM | POA: Insufficient documentation

## 2020-01-01 DIAGNOSIS — F411 Generalized anxiety disorder: Secondary | ICD-10-CM | POA: Insufficient documentation

## 2020-01-01 DIAGNOSIS — F17200 Nicotine dependence, unspecified, uncomplicated: Secondary | ICD-10-CM | POA: Diagnosis not present

## 2020-01-01 DIAGNOSIS — F333 Major depressive disorder, recurrent, severe with psychotic symptoms: Secondary | ICD-10-CM | POA: Diagnosis not present

## 2020-01-01 MED ORDER — BUSPIRONE HCL 10 MG PO TABS: 10.0000 mg | ORAL_TABLET | Freq: Every day | ORAL | 2 refills | Status: DC

## 2020-01-01 MED ORDER — BUPROPION HCL ER (XL) 150 MG PO TB24: 150.0000 mg | ORAL_TABLET | ORAL | 2 refills | Status: DC

## 2020-01-01 MED ORDER — QUETIAPINE FUMARATE 100 MG PO TABS: 100.0000 mg | ORAL_TABLET | Freq: Every day | ORAL | 2 refills | Status: DC
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
## 2020-01-03 ENCOUNTER — Other Ambulatory Visit: Payer: Self-pay

## 2020-01-03 ENCOUNTER — Inpatient Hospital Stay (HOSPITAL_COMMUNITY)
Admission: EM | Admit: 2020-01-03 | Discharge: 2020-01-07 | DRG: 917 | Disposition: A | Discharge status: Other Acute Inpatient Hospital | Payer: Medicaid Other | Attending: Internal Medicine | Admitting: Internal Medicine

## 2020-01-03 ENCOUNTER — Encounter (HOSPITAL_COMMUNITY): Payer: Self-pay

## 2020-01-03 DIAGNOSIS — F17220 Nicotine dependence, chewing tobacco, uncomplicated: Secondary | ICD-10-CM | POA: Diagnosis present

## 2020-01-03 DIAGNOSIS — E876 Hypokalemia: Secondary | ICD-10-CM | POA: Diagnosis present

## 2020-01-03 DIAGNOSIS — T43592A Poisoning by other antipsychotics and neuroleptics, intentional self-harm, initial encounter: Principal | ICD-10-CM | POA: Diagnosis present

## 2020-01-03 DIAGNOSIS — Z79899 Other long term (current) drug therapy: Secondary | ICD-10-CM

## 2020-01-03 DIAGNOSIS — R569 Unspecified convulsions: Secondary | ICD-10-CM

## 2020-01-03 DIAGNOSIS — F1410 Cocaine abuse, uncomplicated: Secondary | ICD-10-CM | POA: Diagnosis present

## 2020-01-03 DIAGNOSIS — Z9152 Personal history of nonsuicidal self-harm: Secondary | ICD-10-CM

## 2020-01-03 DIAGNOSIS — T50902A Poisoning by unspecified drugs, medicaments and biological substances, intentional self-harm, initial encounter: Secondary | ICD-10-CM | POA: Diagnosis present

## 2020-01-03 DIAGNOSIS — F1511 Other stimulant abuse, in remission: Secondary | ICD-10-CM | POA: Diagnosis present

## 2020-01-03 DIAGNOSIS — R1110 Vomiting, unspecified: Secondary | ICD-10-CM | POA: Diagnosis present

## 2020-01-03 DIAGNOSIS — C714 Malignant neoplasm of occipital lobe: Secondary | ICD-10-CM | POA: Diagnosis present

## 2020-01-03 DIAGNOSIS — Z91138 Patient's unintentional underdosing of medication regimen for other reason: Secondary | ICD-10-CM

## 2020-01-03 DIAGNOSIS — Z20822 Contact with and (suspected) exposure to covid-19: Secondary | ICD-10-CM | POA: Diagnosis present

## 2020-01-03 DIAGNOSIS — D696 Thrombocytopenia, unspecified: Secondary | ICD-10-CM | POA: Diagnosis present

## 2020-01-03 DIAGNOSIS — G40109 Localization-related (focal) (partial) symptomatic epilepsy and epileptic syndromes with simple partial seizures, not intractable, without status epilepticus: Secondary | ICD-10-CM | POA: Diagnosis present

## 2020-01-03 DIAGNOSIS — T426X6A Underdosing of other antiepileptic and sedative-hypnotic drugs, initial encounter: Secondary | ICD-10-CM | POA: Diagnosis present

## 2020-01-03 DIAGNOSIS — Z85841 Personal history of malignant neoplasm of brain: Secondary | ICD-10-CM

## 2020-01-03 DIAGNOSIS — Z923 Personal history of irradiation: Secondary | ICD-10-CM

## 2020-01-03 DIAGNOSIS — F251 Schizoaffective disorder, depressive type: Secondary | ICD-10-CM | POA: Diagnosis present

## 2020-01-03 DIAGNOSIS — G928 Other toxic encephalopathy: Secondary | ICD-10-CM | POA: Diagnosis present

## 2020-01-03 DIAGNOSIS — Y906 Blood alcohol level of 120-199 mg/100 ml: Secondary | ICD-10-CM | POA: Diagnosis present

## 2020-01-03 DIAGNOSIS — F909 Attention-deficit hyperactivity disorder, unspecified type: Secondary | ICD-10-CM | POA: Diagnosis present

## 2020-01-03 DIAGNOSIS — T50912A Poisoning by multiple unspecified drugs, medicaments and biological substances, intentional self-harm, initial encounter: Secondary | ICD-10-CM | POA: Diagnosis present

## 2020-01-03 DIAGNOSIS — F10129 Alcohol abuse with intoxication, unspecified: Secondary | ICD-10-CM | POA: Diagnosis present

## 2020-01-03 DIAGNOSIS — F411 Generalized anxiety disorder: Secondary | ICD-10-CM | POA: Diagnosis present

## 2020-01-03 LAB — CBC
HCT: 45.3 % (ref 39.0–52.0)
Hemoglobin: 16.1 g/dL (ref 13.0–17.0)
MCH: 33 pg (ref 26.0–34.0)
MCHC: 35.5 g/dL (ref 30.0–36.0)
MCV: 92.8 fL (ref 80.0–100.0)
Platelets: 128 10*3/uL — ABNORMAL LOW (ref 150–400)
RBC: 4.88 MIL/uL (ref 4.22–5.81)
RDW: 12.3 % (ref 11.5–15.5)
WBC: 3.2 10*3/uL — ABNORMAL LOW (ref 4.0–10.5)
nRBC: 0 % (ref 0.0–0.2)

## 2020-01-03 LAB — ETHANOL: Alcohol, Ethyl (B): 189 mg/dL — ABNORMAL HIGH (ref <10)

## 2020-01-03 LAB — COMPREHENSIVE METABOLIC PANEL
ALT: 32 U/L (ref 0–44)
AST: 24 U/L (ref 15–41)
Albumin: 4.4 g/dL (ref 3.5–5.0)
Alkaline Phosphatase: 73 U/L (ref 38–126)
Anion gap: 13 (ref 5–15)
BUN: 10 mg/dL (ref 6–20)
CO2: 22 mmol/L (ref 22–32)
Calcium: 8.6 mg/dL — ABNORMAL LOW (ref 8.9–10.3)
Chloride: 104 mmol/L (ref 98–111)
Creatinine, Ser: 0.86 mg/dL (ref 0.61–1.24)
GFR, Estimated: >60 mL/min (ref >60)
Glucose, Bld: 107 mg/dL — ABNORMAL HIGH (ref 70–99)
Potassium: 3.2 mmol/L — ABNORMAL LOW (ref 3.5–5.1)
Sodium: 139 mmol/L (ref 135–145)
Total Bilirubin: 0.5 mg/dL (ref 0.3–1.2)
Total Protein: 7 g/dL (ref 6.5–8.1)

## 2020-01-03 LAB — SALICYLATE LEVEL: Salicylate Lvl: <7 mg/dL — ABNORMAL LOW (ref 7.0–30.0)

## 2020-01-03 LAB — CBG MONITORING, ED: Glucose-Capillary: 105 mg/dL — ABNORMAL HIGH (ref 70–99)

## 2020-01-03 LAB — ACETAMINOPHEN LEVEL: Acetaminophen (Tylenol), Serum: <10 ug/mL — ABNORMAL LOW (ref 10–30)

## 2020-01-03 MED ORDER — SODIUM CHLORIDE 0.9 % IV BOLUS
1000.0000 mL | Freq: Once | INTRAVENOUS | Status: AC
  Administered 2020-01-03: 1000 mL via INTRAVENOUS

## 2020-01-03 NOTE — ED Provider Notes (Signed)
[facility] EMERGENCY DEPARTMENT Provider Note   CSN: [id number] Arrival date & time: 01/03/20  2245     History Chief Complaint  Patient presents with  . Suicidal  . Drug Overdose    Stephen Beard is a 24 y.o. male.  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  This was treated surgically and he is currently undergoing chemotherapy.  He also describes multiple other stressors in his life including his sexuality and relationships with friends.  This evening he was consuming alcohol when he took an overdose of his prescription medication.  He took an unknown quantity of Seroquel, Wellbutrin, BuSpar along with what he describes as "half 1/5 of vodka".  He tells me he did this in an attempt to harm himself.  He denies prior suicide attempts.  He also tells me that for the past month, he has been hearing voices in his head.  The history is provided by the patient.  Drug Overdose This is a new problem. The current episode started 1 to 2 hours ago. The problem occurs constantly. The problem has not changed since onset.Nothing aggravates the symptoms. Nothing relieves the symptoms. He has tried nothing for the symptoms.       Past Medical History:  Diagnosis Date  . ADHD     Patient Active Problem List   Diagnosis Date Noted  . Schizoaffective disorder, depressive type ([place]) 01/01/2020  . Generalized anxiety disorder 01/01/2020  . Severe episode of recurrent major depressive disorder, with psychotic features ([place]) 01/01/2020  . 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 Focal seizures ([place]) 11/07/2019  . Auditory hallucinations 07/30/2019  . Goals of care, counseling/discussion 07/19/2019  . 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 Brain mass 06/30/2019    Past Surgical History:  Procedure  Laterality Date  . APPLICATION OF CRANIAL NAVIGATION N/A 07/02/2019   Procedure: APPLICATION OF CRANIAL NAVIGATION;  Surgeon: Judith Part, MD;  Location: [place];  Service: Neurosurgery;  Laterality: N/A;  . CRANIOTOMY N/A 07/03/2019   Procedure: CRANIOTOMY HEMATOMA EVACUATION SUBDURAL;  Surgeon: Judith Part, MD;  Location: [place];  Service: Neurosurgery;  Laterality: N/A;  . CRANIOTOMY Right 07/03/2019   Procedure: CRANIOTOMY HEMATOMA EVACUATION SUBDURAL;  Surgeon: Judith Part, MD;  Location: [place];  Service: Neurosurgery;  Laterality: Right;  . CRANIOTOMY Right 07/02/2019   Procedure: CRANIOTOMY TUMOR EXCISION with Lucky Rathke;  Surgeon: Judith Part, MD;  Location: [place];  Service: Neurosurgery;  Laterality: Right;  posterior       History reviewed. No pertinent family history.  Social History   Tobacco Use  . Smoking status: Current Every Day Smoker  . Smokeless tobacco: Current User    Types: Chew  Substance Use Topics  . Alcohol use: Yes  . Drug use: No    Home Medications Prior to Admission medications   Medication Sig Start Date End Date Taking? Authorizing Provider  buPROPion (WELLBUTRIN XL) 150 MG 24 hr tablet Take 1 tablet (150 mg total) by mouth every morning. 01/01/20   Salley Slaughter, NP  busPIRone (BUSPAR) 10 MG tablet Take 1 tablet (10 mg total) by mouth daily. 01/01/20   Salley Slaughter, NP  QUEtiapine (SEROQUEL) 100 MG tablet Take 1 tablet (100 mg total) by mouth at bedtime. Start dose at 50 mg at bedtime for the first week. If tolerated  well without any side effects, increase dosage by 50 mg each week. Max dose to not exceed 200 mg 01/01/20   Salley Slaughter, NP    Allergies    Patient has no known allergies.  Review of Systems   Review of Systems  All other systems reviewed and are negative.   Physical Exam Updated Vital Signs BP (!) 152/96 (BP Location: Right Arm)   Pulse (!) 118   Temp (!) 97.5 F (36.4 C)  (Temporal)   Resp 18   Ht 5\' 7"  (1.702 m)   Wt 63.5 kg   SpO2 97%   BMI 21.93 kg/m   Physical Exam Vitals and nursing note reviewed.  Constitutional:      General: He is not in acute distress.    Appearance: He is well-developed. He is not diaphoretic.  HENT:     Head: Normocephalic and atraumatic.  Cardiovascular:     Rate and Rhythm: Normal rate and regular rhythm.     Heart sounds: No murmur heard.  No friction rub.  Pulmonary:     Effort: Pulmonary effort is normal. No respiratory distress.     Breath sounds: Normal breath sounds. No wheezing or rales.  Abdominal:     General: Bowel sounds are normal. There is no distension.     Palpations: Abdomen is soft.     Tenderness: There is no abdominal tenderness.  Musculoskeletal:        General: Normal range of motion.     Cervical back: Normal range of motion and neck supple.  Skin:    General: Skin is warm and dry.  Neurological:     Mental Status: He is alert and oriented to person, place, and time.     Coordination: Coordination normal.  Psychiatric:        Attention and Perception: Attention normal.        Mood and Affect: Mood normal.        Speech: Speech normal.        Thought Content: Thought content includes suicidal ideation. Thought content does not include homicidal ideation. Thought content does not include homicidal or suicidal plan.        Cognition and Memory: Cognition normal.     ED Results / Procedures / Treatments   Labs (all labs ordered are listed, but only abnormal results are displayed) Labs Reviewed  CBG MONITORING, ED - Abnormal; Notable for the following components:      Result Value   Glucose-Capillary 105 (*)    All other components within normal limits  COMPREHENSIVE METABOLIC PANEL  ETHANOL  SALICYLATE LEVEL  ACETAMINOPHEN LEVEL  CBC  RAPID URINE DRUG SCREEN, HOSP PERFORMED    EKG EKG Interpretation  Date/Time:  Saturday January 04 2020 06:25:11 EDT Ventricular Rate:   140 PR Interval:    QRS Duration: 104 QT Interval:  301 QTC Calculation: 460 R Axis:   130 Text Interpretation: Sinus tachycardia Consider right ventricular hypertrophy Inferior infarct, age indeterminate Confirmed by Veryl Speak [phone]) on 01/04/2020 7:22:12 AM   Radiology No results found.  Procedures Procedures (including critical care time)  Medications Ordered in ED Medications - No data to display  ED Course  I have reviewed the triage vital signs and the nursing notes.  Pertinent labs & imaging results that were available during my care of the patient were reviewed by me and considered in my medical decision making (see chart for details).    MDM Rules/Calculators/A&P  Patient is a 24 year old  male with history of oligodendroglioma status post surgical resection and chemotherapy.  He also has a history of behavioral health issues.  He presents today with complaints of overdose.  Patient took unknown quantities of Wellbutrin, BuSpar, and Seroquel and informed me that this was an attempt to harm himself.Marland Kitchen  He also had consumed a significant quantity of vodka this evening.  Initially upon arrival, patient had informed me that he did not want to stay and wanted to go home.  Patient informed if he tried to leave, he would be involuntarily committed.  He would like to try to avoid this and has agreed to speak with TTS.  Poison control has been consulted about his overdose.  They are recommending a 24-hour admission due to the ingestion of these medications.  They also recommended charcoal which was administered.  Laboratory studies have returned showing a blood alcohol level of 189, positive drug screen for cocaine, but are otherwise unremarkable.  While patient was being monitored, he did experience 1 episode of a [place] seizure.  He received Ativan.  He remains groggy, but has since returned to baseline.  A head CT was obtained and shows no acute change.  Care discussed  with neurology who has recommended a one-time dose of Keppra 1500 mg which was administered.  Patient will require medical admission for further monitoring.  Other recommendations of maintaining the potassium at a level of 4 have been followed.  Patient given 2 runs of potassium.  Magnesium at an adequate level.  Poison control also recommending bicarb due to his tachycardia.  This has been given as well.  Patient awaiting a return phone call from the hospitalist service for admission.  CRITICAL CARE Performed by: Veryl Speak Total critical care time: 70 minutes Critical care time was exclusive of separately billable procedures and treating other patients. Critical care was necessary to treat or prevent imminent or life-threatening deterioration. Critical care was time spent personally by me on the following activities: development of treatment plan with patient and/or surrogate as well as nursing, discussions with consultants, evaluation of patient's response to treatment, examination of patient, obtaining history from patient or surrogate, ordering and performing treatments and interventions, ordering and review of laboratory studies, ordering and review of radiographic studies, pulse oximetry and re-evaluation of patient's condition.   Final Clinical Impression(s) / ED Diagnoses Final diagnoses:  None    Rx / DC Orders ED Discharge Orders    None       Veryl Speak, MD 01/04/20 [phone]

## 2020-01-03 NOTE — ED Triage Notes (Signed)
Pt BIB [facility] for eval of SI/Drug overdose. Pt from home, reported to have taken questionable amount of multiple prescriptions (bottles brought in w pt). Pt also consumed roughly half a fifth of vodka & vomited approx 343mL 62min later on EMS arrival. Pt A&Ox4, VSS w EMS & on arrival. Pt recently diagnosed with brain tumor, per EMS "has been dealing with depression since."  18g L AC 130/80 HR 110 95% 14

## 2020-01-04 ENCOUNTER — Emergency Department (HOSPITAL_COMMUNITY): Payer: Medicaid Other

## 2020-01-04 ENCOUNTER — Other Ambulatory Visit: Payer: Self-pay | Admitting: Internal Medicine

## 2020-01-04 DIAGNOSIS — T50912A Poisoning by multiple unspecified drugs, medicaments and biological substances, intentional self-harm, initial encounter: Secondary | ICD-10-CM | POA: Diagnosis present

## 2020-01-04 LAB — RAPID URINE DRUG SCREEN, HOSP PERFORMED
Amphetamines: NOT DETECTED
Barbiturates: NOT DETECTED
Benzodiazepines: NOT DETECTED
Cocaine: POSITIVE — AB
Opiates: NOT DETECTED
Tetrahydrocannabinol: NOT DETECTED

## 2020-01-04 LAB — BASIC METABOLIC PANEL
Anion gap: 14 (ref 5–15)
BUN: 8 mg/dL (ref 6–20)
CO2: 22 mmol/L (ref 22–32)
Calcium: 9.4 mg/dL (ref 8.9–10.3)
Chloride: 112 mmol/L — ABNORMAL HIGH (ref 98–111)
Creatinine, Ser: 1.02 mg/dL (ref 0.61–1.24)
GFR, Estimated: >60 mL/min (ref >60)
Glucose, Bld: 110 mg/dL — ABNORMAL HIGH (ref 70–99)
Potassium: 3.6 mmol/L (ref 3.5–5.1)
Sodium: 148 mmol/L — ABNORMAL HIGH (ref 135–145)

## 2020-01-04 LAB — MAGNESIUM
Magnesium: 2.3 mg/dL (ref 1.7–2.4)
Magnesium: 2.2 mg/dL (ref 1.7–2.4)

## 2020-01-04 LAB — RESPIRATORY PANEL BY RT PCR (FLU A&B, COVID)
Influenza A by PCR: NEGATIVE
Influenza B by PCR: NEGATIVE
SARS Coronavirus 2 by RT PCR: NEGATIVE

## 2020-01-04 LAB — ACETAMINOPHEN LEVEL: Acetaminophen (Tylenol), Serum: <10 ug/mL — ABNORMAL LOW (ref 10–30)

## 2020-01-04 IMAGING — CT CT HEAD W/O CM
4 series · 16 of 47 positions shown, 18 images · non-contrast
Comparison: [DATE]

CLINICAL DATA: Seizure and encephalopathy.

EXAM:
CT HEAD WITHOUT CONTRAST
TECHNIQUE: Contiguous axial images were obtained from the base of the skull
through the vertex without intravenous contrast.

[Series 3: head wo · axial · 0.46mm/px · z∈[-133,-13]mm · 7 of 34 slices shown, 9 images]
[im 5/34  brain]
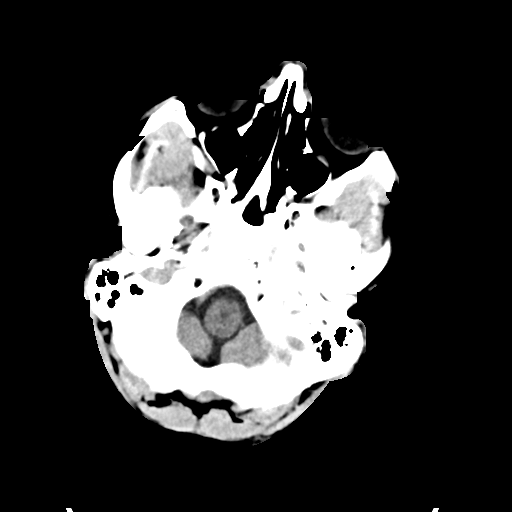
[im 5/34  bone]
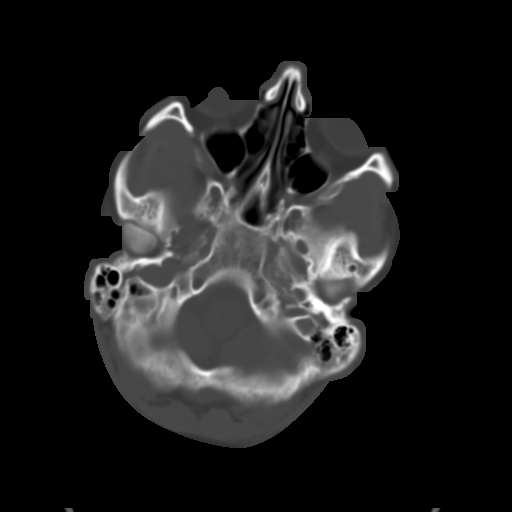
[im 9/34  brain]
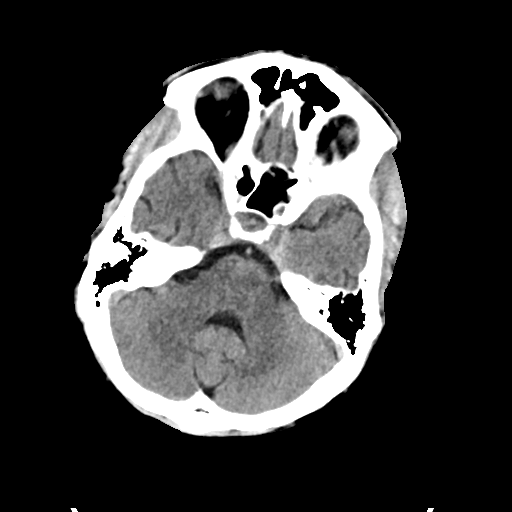
[im 13/34  brain]
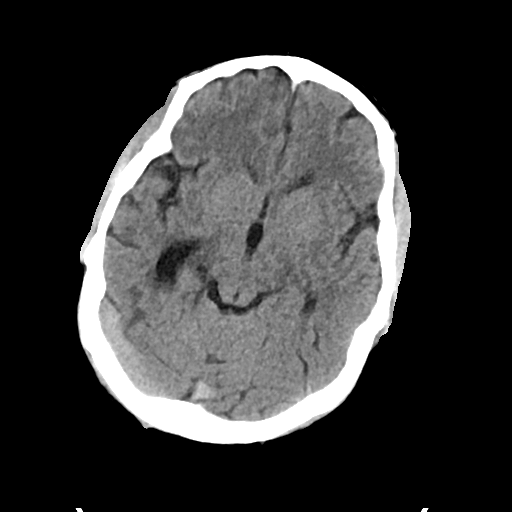
[im 17/34  brain]
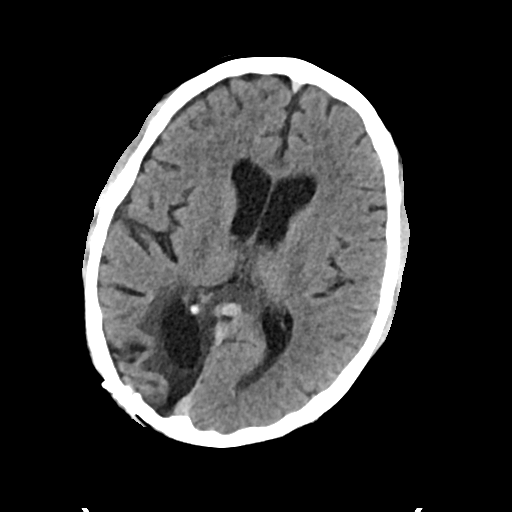
[im 21/34  brain]
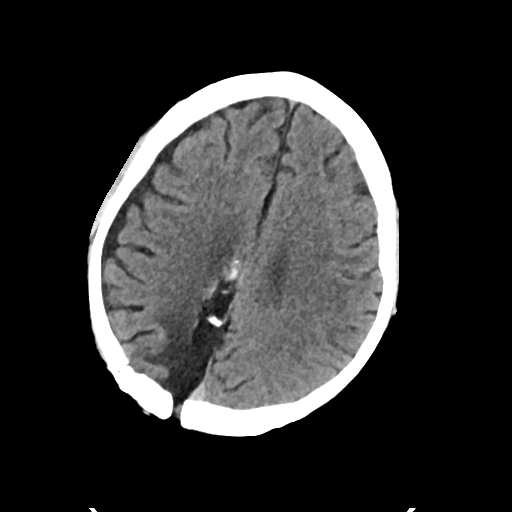
[im 21/34  bone]
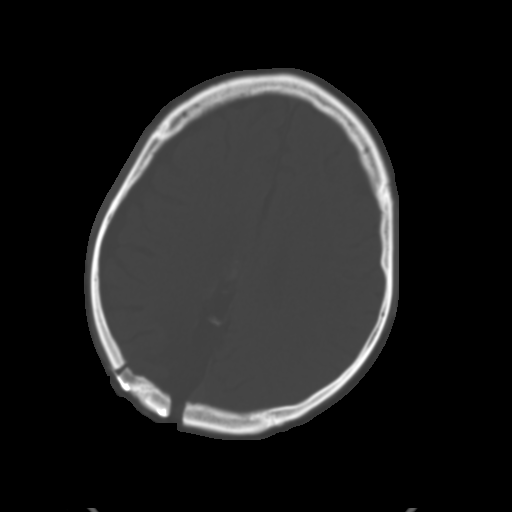
[im 25/34  brain]
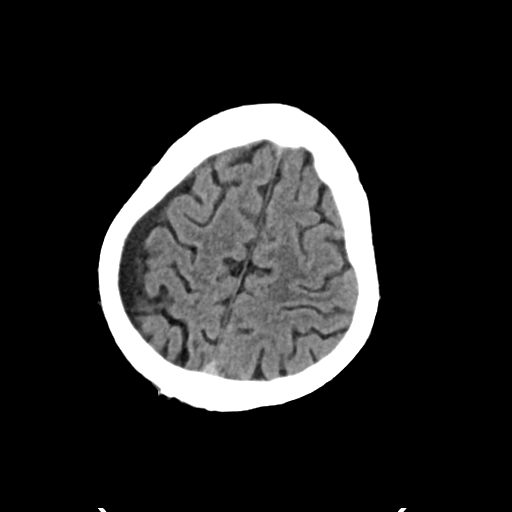
[im 29/34  brain]
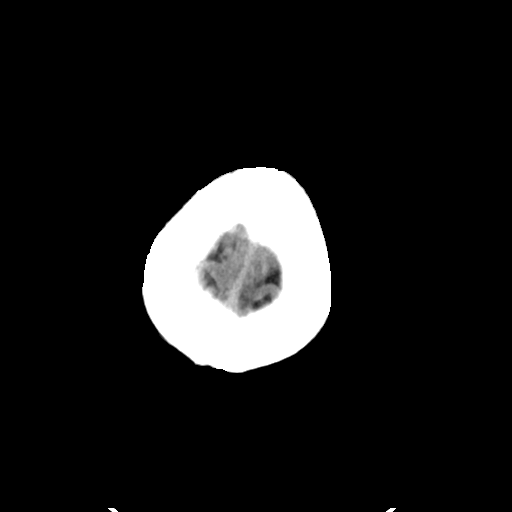

[Series 4: head bone · axial · 0.46mm/px · z∈[-137,-105]mm · 3 of 84 slices shown]
[im 9/84  bone]
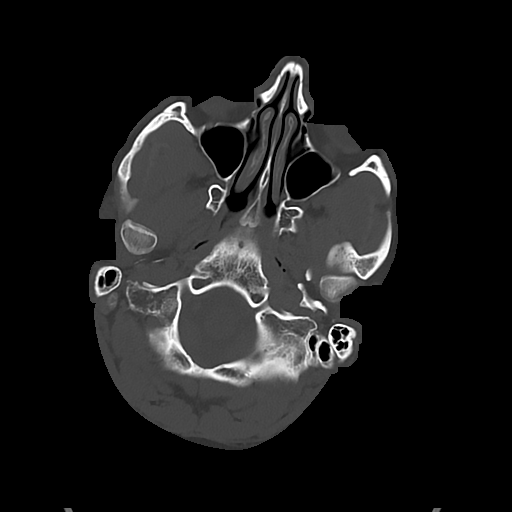
[im 17/84  bone]
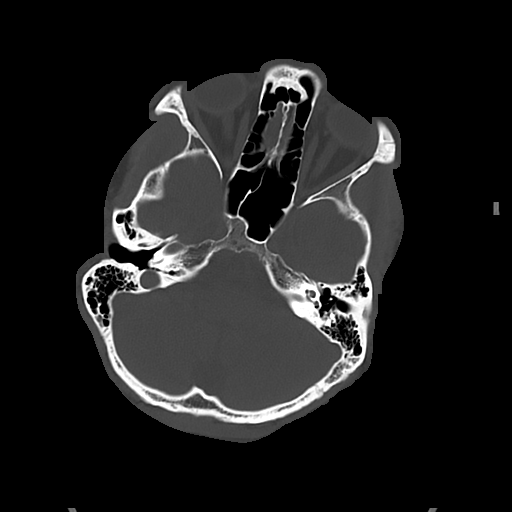
[im 25/84  bone]
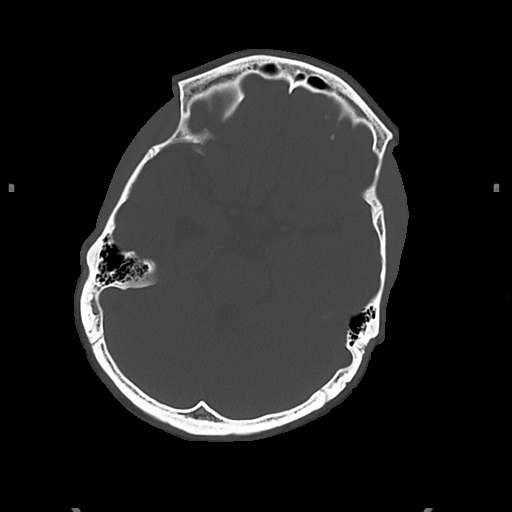

[Series 5: cor soft · coronal · 0.31mm/px · 3 of 78 slices shown]
[im 26/78  brain]
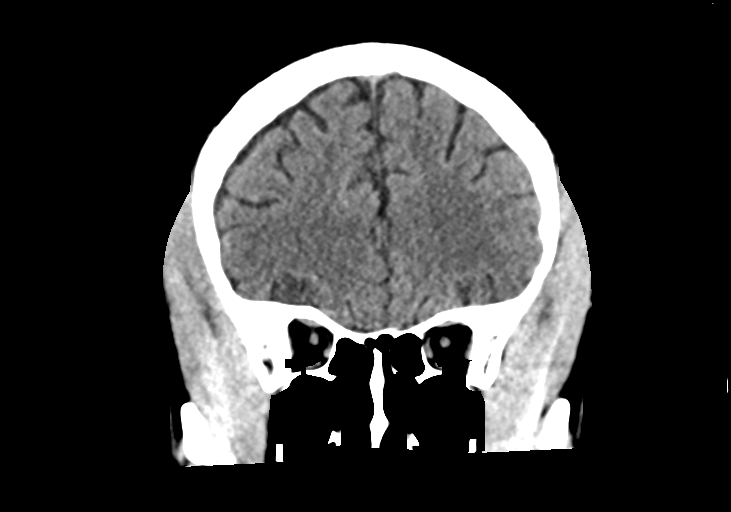
[im 35/78  brain]
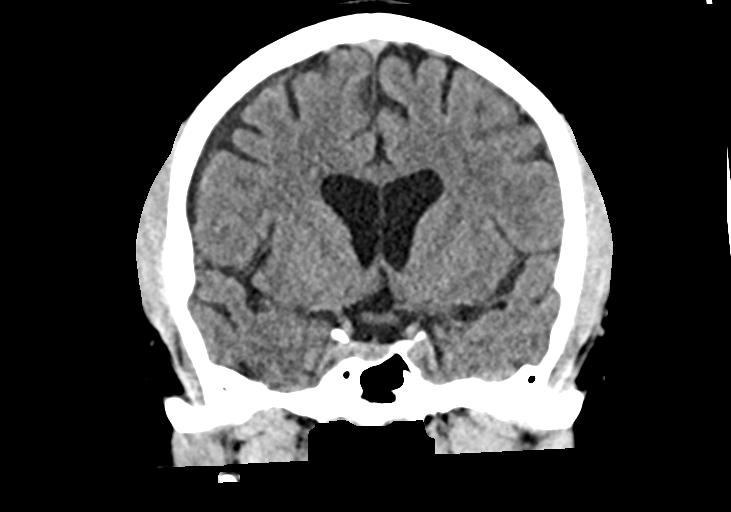
[im 43/78  brain]
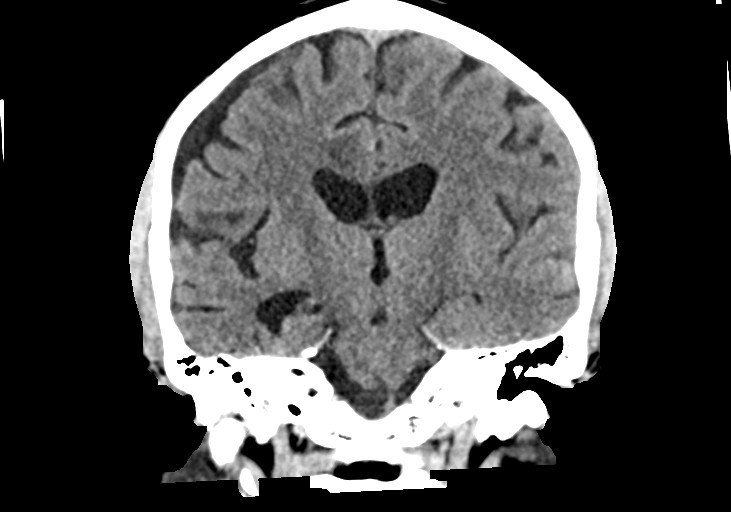

[Series 6: sag soft · sagittal · 0.42mm/px · 3 of 56 slices shown]
[im 19/56  brain]
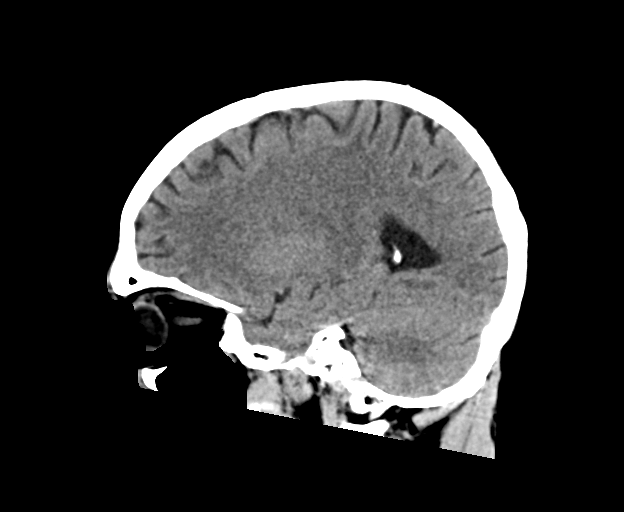
[im 28/56  brain]
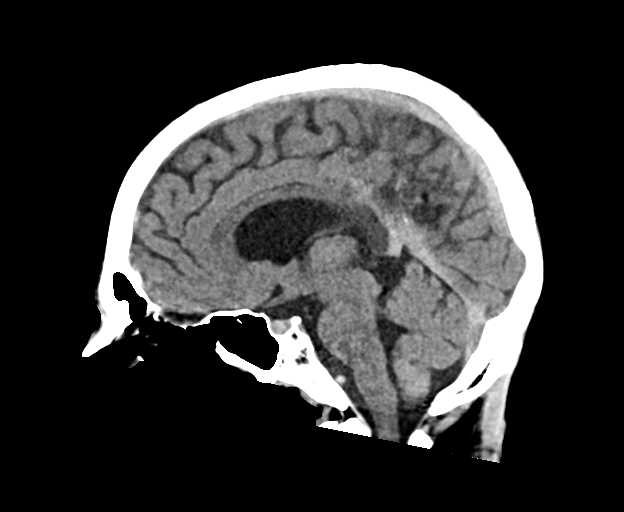
[im 37/56  brain]
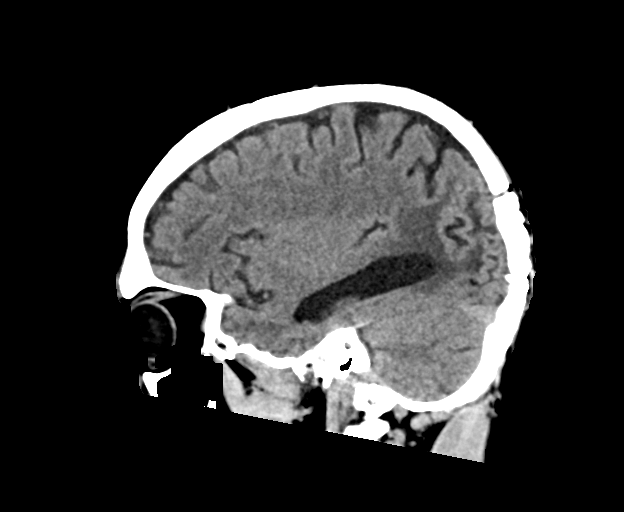

[16 of 47 positions shown; findings below may reference images not displayed]

FINDINGS: Brain: Right occipital encephalomalacia. No hemorrhage or
extra-axial collection. Ex vacuo dilatation of the right lateral
ventricle.

Vascular: No abnormal hyperdensity of the major intracranial
arteries or dural venous sinuses. No intracranial atherosclerosis.

Skull: Right posterior remote craniotomy site.

Sinuses/Orbits: No fluid levels or advanced mucosal thickening of
the visualized paranasal sinuses. No mastoid or middle ear effusion.
The orbits are normal.
IMPRESSION: 1. No acute intracranial abnormality.
2. Right occipital encephalomalacia and remote craniotomy site.

## 2020-01-04 MED ORDER — ENOXAPARIN SODIUM 40 MG/0.4ML ~~LOC~~ SOLN
40.0000 mg | SUBCUTANEOUS | Status: DC
  Administered 2020-01-04 – 2020-01-07 (×4): 40 mg via SUBCUTANEOUS
  Filled 2020-01-04 (×4): qty 0.4

## 2020-01-04 MED ORDER — POTASSIUM CHLORIDE 10 MEQ/100ML IV SOLN
10.0000 meq | Freq: Once | INTRAVENOUS | Status: AC
  Administered 2020-01-04: 10 meq via INTRAVENOUS
  Filled 2020-01-04: qty 100

## 2020-01-04 MED ORDER — SODIUM CHLORIDE 0.9 % IV SOLN: 75.0000 mL/h | INTRAVENOUS | Status: DC

## 2020-01-04 MED ORDER — POTASSIUM CHLORIDE CRYS ER 20 MEQ PO TBCR
40.0000 meq | EXTENDED_RELEASE_TABLET | Freq: Once | ORAL | Status: AC
  Administered 2020-01-04: 40 meq via ORAL
  Filled 2020-01-04: qty 2

## 2020-01-04 MED ORDER — ONDANSETRON HCL 4 MG/2ML IJ SOLN: 4.0000 mg | Freq: Four times a day (QID) | INTRAMUSCULAR | Status: DC | PRN

## 2020-01-04 MED ORDER — SODIUM BICARBONATE 8.4 % IV SOLN
50.0000 meq | INTRAVENOUS | Status: DC | PRN
  Administered 2020-01-04: 50 meq via INTRAVENOUS
  Filled 2020-01-04: qty 50

## 2020-01-04 MED ORDER — LORAZEPAM 2 MG/ML IJ SOLN
1.0000 mg | INTRAMUSCULAR | Status: DC | PRN
  Administered 2020-01-04 – 2020-01-06 (×3): 2 mg via INTRAVENOUS
  Filled 2020-01-04 (×4): qty 1

## 2020-01-04 MED ORDER — POLYETHYLENE GLYCOL 3350 17 G PO PACK: 17.0000 g | PACK | Freq: Every day | ORAL | Status: DC | PRN

## 2020-01-04 MED ORDER — DOCUSATE SODIUM 100 MG PO CAPS
100.0000 mg | ORAL_CAPSULE | Freq: Two times a day (BID) | ORAL | Status: DC
  Administered 2020-01-04 – 2020-01-07 (×6): 100 mg via ORAL
  Filled 2020-01-04 (×6): qty 1

## 2020-01-04 MED ORDER — LORAZEPAM 2 MG/ML IJ SOLN
INTRAMUSCULAR | Status: AC
  Filled 2020-01-04: qty 1

## 2020-01-04 MED ORDER — ACETAMINOPHEN 325 MG PO TABS: 650.0000 mg | ORAL_TABLET | ORAL | Status: DC | PRN

## 2020-01-04 MED ORDER — QUETIAPINE FUMARATE 50 MG PO TABS
50.0000 mg | ORAL_TABLET | Freq: Four times a day (QID) | ORAL | Status: DC | PRN
  Filled 2020-01-04: qty 1

## 2020-01-04 MED ORDER — LORAZEPAM 2 MG/ML IJ SOLN
1.0000 mg | INTRAMUSCULAR | Status: DC | PRN
  Administered 2020-01-04 – 2020-01-05 (×3): 1 mg via INTRAVENOUS
  Filled 2020-01-04 (×3): qty 1

## 2020-01-04 MED ORDER — LEVETIRACETAM IN NACL 1500 MG/100ML IV SOLN
1500.0000 mg | Freq: Once | INTRAVENOUS | Status: AC
  Administered 2020-01-04: 1500 mg via INTRAVENOUS
  Filled 2020-01-04: qty 100

## 2020-01-04 MED ORDER — SODIUM CHLORIDE 0.9 % IV SOLN: INTRAVENOUS | Status: DC

## 2020-01-04 MED ORDER — SODIUM BICARBONATE 8.4 % IV SOLN
50.0000 meq | Freq: Once | INTRAVENOUS | Status: AC
  Administered 2020-01-04: 50 meq via INTRAVENOUS
  Filled 2020-01-04: qty 50

## 2020-01-04 MED ORDER — ACETAMINOPHEN 650 MG RE SUPP: 650.0000 mg | RECTAL | Status: DC | PRN

## 2020-01-04 MED ORDER — ONDANSETRON HCL 4 MG PO TABS
4.0000 mg | ORAL_TABLET | Freq: Four times a day (QID) | ORAL | Status: DC | PRN
  Administered 2020-01-07: 4 mg via ORAL
  Filled 2020-01-04: qty 1

## 2020-01-04 MED ORDER — CHARCOAL ACTIVATED PO LIQD
50.0000 g | Freq: Once | ORAL | Status: AC
  Administered 2020-01-04: 50 g via ORAL
  Filled 2020-01-04: qty 240

## 2020-01-04 MED ORDER — QUETIAPINE FUMARATE 50 MG PO TABS
50.0000 mg | ORAL_TABLET | Freq: Every day | ORAL | Status: DC
  Administered 2020-01-04 – 2020-01-06 (×3): 50 mg via ORAL
  Filled 2020-01-04 (×3): qty 1

## 2020-01-04 MED ORDER — TEMOZOLOMIDE 100 MG PO CAPS: 100.0000 mg | ORAL_CAPSULE | Freq: Every day | ORAL | Status: DC

## 2020-01-04 MED ORDER — LORAZEPAM 2 MG/ML IJ SOLN
2.0000 mg | Freq: Once | INTRAMUSCULAR | Status: AC
  Administered 2020-01-04: 2 mg via INTRAVENOUS

## 2020-01-04 NOTE — Progress Notes (Signed)
Patient appeared very agitated and emotional upon arrival of tech, as tech was bring the EEG cart into the room pt tried to jump out of the bed .  Tech will try again tomorrow.

## 2020-01-04 NOTE — ED Notes (Signed)
Report given to 3W RN, Amy

## 2020-01-04 NOTE — ED Notes (Signed)
Mother- Julaine [phone]

## 2020-01-04 NOTE — ED Notes (Signed)
Pt's aunt Vikki Ports) leaving bedside; requests updates be delivered to her via phone [phone]

## 2020-01-04 NOTE — ED Notes (Signed)
Pt requests water, but multiple attempts to help him drink water through a straw failed due to difficultly following directions and understanding process. Wet sponge used to moisten pt mouth.

## 2020-01-04 NOTE — BH Assessment (Addendum)
Clinician spoke to [facility], RN to see if the pt is medically cleared. RN consulted with the EDP who reported, the pt is able to engage in the TTS assessment however he can not leave the ED. RN expressed the pt took unknown amount of his medications, drank a fifth of Vodka and needs cardiac observetion for 24 hours. Clinician consulted with Caroline Sauger, PMHNP who expressed the pt is not medically cleared. Clinician expressed to RN day shift staff to check back on pt's status. RN to update EDP.   Pt to be assessed once medically cleared.   Vertell Novak, [place], [facility], [facility] Triage Specialist [phone]

## 2020-01-04 NOTE — ED Notes (Signed)
[place] Poison Control contacted by this RN, spoke with Andee Poles, suggestions include 24 hour cardiac observation, activated charcoal of 1g/kg, max dose 100g, aggressive hydration for tachycardia, benzos for seizure activity, serial EKGs every 4 hours, looking for QRS greater than 120 or QTc greater than 500, tylenol level 4 hours after arrival.

## 2020-01-04 NOTE — ED Provider Notes (Signed)
[facility] paperwork completed.  Order placed.  Patient will be admitted by hospitalist.  For the overdose.   Fredia Sorrow, MD 01/04/20 [phone]

## 2020-01-04 NOTE — H&P (Signed)
History and Physical    [name]:[id number] DOB: [birth year] DOA: 01/03/2020  PCP: [facility]:  Mickeal Skinner - oncology; Lisbeth Renshaw - rad onc; [place] - neurosurgery Patient coming from:  Home; NOK: Mother, Chrles Selley, [phone]  Chief Complaint: overdose  HPI: Stephen Beard is a 24 y.o. male with medical history significant of ADHD/psychosis/schizoaffective disorder and oligodendroglioma presenting with intentional overdose.  According to the chart, the patient took an uncertain number of pills, drank 1/2 of a fifth of vodka, and vomited about 30 minutes later.  He was able to provide history to Dr. Stark Jock and reported recent depression associated with medical problems as well as "his sexuality and relationships with friends."  He took Seroquel, Wellbutrin, and Buspar - all of which ere just prescribed on 10/20.  He had normal cognition according to Dr. Estanislado Pandy note.  At the time of my evaluation, the patient was quite stuporous with mostly unintelligible speech and he was clearly responding to voices and reacting to internal stimuli.  He exhibited periodic mildly threatening movements.  Between the EDP evaluation and mine, the patient had a witnessed seizure and was given Ativan.    ED Course: h/o schizoaffective and recent oligodendroglioma and had recent resection.  Last night, drinking and depressed and took Buspar, Wellbutrin, and a bunch of other stuff.  Poison control recommends 24 obs - seizures and the like.  Had a seizure, broke with Ativan. Neurology thinks Wellbutrin but recommends loading with Keppra.  Needs bicarb for tachycardia.  K+ should be 4.0.  Mag++ should be 2+.    Review of Systems: Unable to perform  Ambulatory Status:  Ambulates without assistance  COVID Vaccine Status:  Unknown  Past Medical History:  Diagnosis Date  . ADHD     Past Surgical History:  Procedure Laterality Date  . APPLICATION OF CRANIAL  NAVIGATION N/A 07/02/2019   Procedure: APPLICATION OF CRANIAL NAVIGATION;  Surgeon: Judith Part, MD;  Location: [place];  Service: Neurosurgery;  Laterality: N/A;  . CRANIOTOMY N/A 07/03/2019   Procedure: CRANIOTOMY HEMATOMA EVACUATION SUBDURAL;  Surgeon: Judith Part, MD;  Location: [place];  Service: Neurosurgery;  Laterality: N/A;  . CRANIOTOMY Right 07/03/2019   Procedure: CRANIOTOMY HEMATOMA EVACUATION SUBDURAL;  Surgeon: Judith Part, MD;  Location: [place];  Service: Neurosurgery;  Laterality: Right;  . CRANIOTOMY Right 07/02/2019   Procedure: CRANIOTOMY TUMOR EXCISION with Lucky Rathke;  Surgeon: Judith Part, MD;  Location: [place];  Service: Neurosurgery;  Laterality: Right;  posterior    Social History   Socioeconomic History  . Marital status: Single    Spouse name: Not on file  . Number of children: Not on file  . Years of education: Not on file  . Highest education level: Not on file  Occupational History  . Not on file  Tobacco Use  . Smoking status: Current Every Day Smoker  . Smokeless tobacco: Current User    Types: Chew  Substance and Sexual Activity  . Alcohol use: Yes  . Drug use: No  . Sexual activity: Not on file  Other Topics Concern  . Not on file  Social History Narrative  . Not on file   Social Determinants of Health   Financial Resource Strain:   . Difficulty of Paying Living Expenses: Not on file  Food Insecurity:   . Worried About Charity fundraiser in the Last Year: Not on file  . Ran Out of Food in the  Last Year: Not on file  Transportation Needs:   . Lack of Transportation (Medical): Not on file  . Lack of Transportation (Non-Medical): Not on file  Physical Activity:   . Days of Exercise per Week: Not on file  . Minutes of Exercise per Session: Not on file  Stress:   . Feeling of Stress : Not on file  Social Connections:   . Frequency of Communication with Friends and Family: Not on file  . Frequency of Social Gatherings  with Friends and Family: Not on file  . Attends Religious Services: Not on file  . Active Member of Clubs or Organizations: Not on file  . Attends Archivist Meetings: Not on file  . Marital Status: Not on file  Intimate Partner Violence:   . Fear of Current or Ex-Partner: Not on file  . Emotionally Abused: Not on file  . Physically Abused: Not on file  . Sexually Abused: Not on file    No Known Allergies  History reviewed. No pertinent family history.  Prior to Admission medications   Medication Sig Start Date End Date Taking? Authorizing Provider  buPROPion (WELLBUTRIN XL) 150 MG 24 hr tablet Take 1 tablet (150 mg total) by mouth every morning. 01/01/20  Yes Eulis Canner E, NP  busPIRone (BUSPAR) 10 MG tablet Take 1 tablet (10 mg total) by mouth daily. 01/01/20  Yes Eulis Canner E, NP  QUEtiapine (SEROQUEL) 100 MG tablet Take 1 tablet (100 mg total) by mouth at bedtime. Start dose at 50 mg at bedtime for the first week. If tolerated well without any side effects, increase dosage by 50 mg each week. Max dose to not exceed 200 mg 01/01/20  Yes Eulis Canner E, NP  temozolomide (TEMODAR) 100 MG capsule Take 100 mg by mouth daily. May take on an empty stomach or at bedtime to decrease nausea & vomiting.   Yes [provider]    Physical Exam: Vitals:   01/04/20 1315 01/04/20 1330 01/04/20 1345 01/04/20 1400  BP:      Pulse: (!) 106 (!) 106 (!) 109   Resp: (!) 25 (!) 24 (!) 23 17  Temp:      TempSrc:      SpO2: 96% 99% 94%   Weight:      Height:         . General:  Appears stuporous, poor eye contact, mostly nonsensical speech . Eyes:  PERRL, EOMI, normal lids, iris . ENT:  grossly normal hearing, lips & tongue, mmm . Neck:  no LAD, masses or thyromegaly . Cardiovascular:  RR with tachycardia to 130s, no m/r/g. No LE edema.  Marland Kitchen Respiratory:   CTA bilaterally with no wheezes/rales/rhonchi.  Normal respiratory effort. . Abdomen:  soft, NT, ND,  NABS . Skin:  no rash or induration seen on limited exam . Musculoskeletal:  grossly normal tone BUE/BLE, good ROM, no bony abnormality . Psychiatric:  eccentric mood and affect, speech occasionally appropriate but mostly nonsensical . Neurologic:  Unable to effectively perform    Radiological Exams on Admission: CT Head Wo Contrast  Result Date: 01/04/2020 CLINICAL DATA:  Seizure and encephalopathy. EXAM: CT HEAD WITHOUT CONTRAST TECHNIQUE: Contiguous axial images were obtained from the base of the skull through the vertex without intravenous contrast. COMPARISON:  07/03/2019 FINDINGS: Brain: Right occipital encephalomalacia. No hemorrhage or extra-axial collection. Ex vacuo dilatation of the right lateral ventricle. Vascular: No abnormal hyperdensity of the major intracranial arteries or dural venous sinuses. No intracranial atherosclerosis. Skull:  Right posterior remote craniotomy site. Sinuses/Orbits: No fluid levels or advanced mucosal thickening of the visualized paranasal sinuses. No mastoid or middle ear effusion. The orbits are normal. IMPRESSION: 1. No acute intracranial abnormality. 2. Right occipital encephalomalacia and remote craniotomy site. Electronically Signed   By: Ulyses Jarred M.D.   On: 01/04/2020 05:51    EKG: Independently reviewed.   2300 - Sinus tachycardia with rate 109; nonspecific ST changes with no evidence of acute ischemia 0529 - Sinus tachycardia with rate 148; prolonged QTc 613;  nonspecific ST changes that are likely rate related 0625 - Sinus tachycardia with rate 140; RVH; nonspecific ST changes that are likely rate related 1150 - Sinus tachycardia with rate 119; borderline IVCD; prolonged QTc 500; nonspecific ST changes that are likely rate related   Labs on Admission: I have personally reviewed the available labs and imaging studies at the time of the admission.  Pertinent labs:   K+ 3.2 Mag++ 2.3 Glucose 107 WBC 3.2 Platelets 128 ASA <7 APAP  <10 UDS + cocaine COVID/flu negative  Assessment/Plan Principal Problem:   Drug overdose, multiple drugs, intentional self-harm, initial encounter [facility]) Active Problems:   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HCC)   Focal seizures ([place])   Schizoaffective disorder, depressive type ([place])   Intentional overdose -Patient with multiple psychiatric illnesses presenting with what was reported to be an intentional overdose of medications that were newly prescribed on 10/20 - Wellbutrin, Seroquel, and BuSpar -Witnessed seizure in the ER and post-ictal/stuporous state following -Tylenol negative -Salicylate negative -UDS positive for cocaine -Poison Control was contacted by the ER and they suggest overnight observation; activated charcoal; aggressive hydration; BZD prn seizure activity; repeat Tylenol level; and q4h EKG -Will observe for now in Progressive care -Will treat tachycardia with bicarbonate prn -Will attempt to keep K+ >4 and Mag++ > 2; will trend q12h -Will need sitter -Suicide precautions -Seizure precautions, as this is an anticipated side effect and patient has already had a witnessed seizure in the ER -Will need psychiatry consult  -EEG is pending -Neurology consult is pending  Schizoaffective disorder -He was last seen in [facility] on 10/20; this was his initial psychiatric evaluation -He has depressed mood, difficulty concentrating, panic attacks, disturbed sleep, auditory hallucinations, and paranoia -He has h/o polysubstance abuse including methamphetamines, cocaine, alcohol, and BZD -His Seroquel was increased from 50 mg to 100 mg daily and he was started on Wellbutrin 150 and BuSpar 10 mg TID  -Considered treating agitation with Seroquel but will change to Ativan prn since we are uncertain how much Seroquel he already took -Psych consult, currently does not appear stable for d/c and so IVC paperwork completed by EDP  Oligodendroglioma -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 Mickeal Skinner, last seen on 9/23 and is due for appointment again this coming week     Note: This patient has been tested and is negative for the novel coronavirus COVID-19. The patient has been fully vaccinated against COVID-19.    DVT prophylaxis:  Lovenox  Code Status:  Full  Family Communication: None present; I attempted to call his mother after admission and was unable to reach her Disposition Plan:  The patient is from: home  Anticipated d/c is to: inpatient behavioral health  Anticipated d/c date will depend on clinical response  to treatment, but possibly as early as tomorrow if he has excellent response to treatment  Patient is currently: acutely ill Consults called: Neurology; psychiatry Admission status:  It is my clinical opinion that referral for OBSERVATION is reasonable and necessary in this patient based on the above information provided. The aforementioned taken together are felt to place the patient at high risk for further clinical deterioration. However it is anticipated that the patient may be medically stable for discharge from the hospital within 24 to 48 hours.    Karmen Bongo MD [facility]   How to contact the [facility] Attending or Consulting provider [place] or covering provider during after hours [place], for this patient?  1. Check the care team in [facility] and look for a) attending/consulting TR[facility] provider listed and b) the [facility] team listed 2. Log into [email or web address] and use [place]'s universal password to access. If you do not have the password, please contact the hospital operator. 3. Locate the [facility] provider you are looking for under [facility] and page to a number that you can be directly  reached. 4. If you still have difficulty reaching the provider, please page the [facility] (Director on Call) for the Hospitalists listed on amion for assistance.   01/04/2020, 3:06 PM

## 2020-01-04 NOTE — ED Notes (Signed)
Poison control consulted. Pt most up to date EKG (11:50) showed QRS of 125, PC advised goal of less than 100, but at least showing continued downward trend in next EKG. Most up to date QTc is 500, advised this is higher than wanted, but satisfied with downward trend. PC advised to watch for seizure activity for 24 hours from overdose and to consult again if needed at next EKG.

## 2020-01-04 NOTE — BH Assessment (Signed)
Clinician's call was transferred to pt's nurse by Jerene Pitch, NS however there was no answer. Clinician to check back.    Vertell Novak, [place], [facility], [facility] Triage Specialist [phone]

## 2020-01-04 NOTE — ED Notes (Addendum)
This RN spoke to [place] at [employer], advised Andee Poles of pt's witnessed seizure. Danielle advised this RN to keep "potassium around 4, magnesium around 2, & do 1-3mEq/kg bicarb pushes." Staff to rpt EKG after intervention & assess need for additional bolus/ intervention. MD Delo advised of same, to place appropriate orders

## 2020-01-04 NOTE — ED Notes (Signed)
Pt reports wanting to drink water, but multiple attempts to aid pt in drinking were unsuccessful due to inability to use  patient does not

## 2020-01-04 NOTE — ED Notes (Signed)
Pt had witnessed seizure lasting approx 33min. 2mg  ativan ordered & administered per [facility] from [facility]

## 2020-01-04 NOTE — ED Notes (Addendum)
Sitter notified RN of change in pt behavior. Pt now having possible seizure life activity with periods of unblinking staring into space, slight shaking, and muscle tensing. Pt diaphoretic and saturations drop into the high 80s during these periods. Pt increasingly agitated and trying to get out of bed, lunging at staff. Appears to be responding to external stimuli. 2mg  of Ativan given.

## 2020-01-04 NOTE — ED Notes (Signed)
Pt mother contacted, notified of patients current status and care plan. Stated she will bring pt's chemo medication to the hospital later today.

## 2020-01-04 NOTE — ED Notes (Addendum)
Pt has been changed into purple scrubs. Pt was wanded by security. Pt belongings were inventoried and documented in a locker. Pt medications from home (brought in by EMS) was recorded and taken to pharmacy. Pt phone was collected and placed in valuables bag then given to security.

## 2020-01-05 ENCOUNTER — Observation Stay (HOSPITAL_COMMUNITY): Payer: Medicaid Other

## 2020-01-05 DIAGNOSIS — T43592A Poisoning by other antipsychotics and neuroleptics, intentional self-harm, initial encounter: Secondary | ICD-10-CM | POA: Diagnosis present

## 2020-01-05 DIAGNOSIS — F29 Unspecified psychosis not due to a substance or known physiological condition: Secondary | ICD-10-CM | POA: Diagnosis not present

## 2020-01-05 DIAGNOSIS — R1110 Vomiting, unspecified: Secondary | ICD-10-CM | POA: Diagnosis present

## 2020-01-05 DIAGNOSIS — Z923 Personal history of irradiation: Secondary | ICD-10-CM | POA: Diagnosis not present

## 2020-01-05 DIAGNOSIS — F909 Attention-deficit hyperactivity disorder, unspecified type: Secondary | ICD-10-CM | POA: Diagnosis present

## 2020-01-05 DIAGNOSIS — Z79899 Other long term (current) drug therapy: Secondary | ICD-10-CM | POA: Diagnosis not present

## 2020-01-05 DIAGNOSIS — G40109 Localization-related (focal) (partial) symptomatic epilepsy and epileptic syndromes with simple partial seizures, not intractable, without status epilepticus: Secondary | ICD-10-CM | POA: Diagnosis present

## 2020-01-05 DIAGNOSIS — Z91138 Patient's unintentional underdosing of medication regimen for other reason: Secondary | ICD-10-CM | POA: Diagnosis not present

## 2020-01-05 DIAGNOSIS — F17220 Nicotine dependence, chewing tobacco, uncomplicated: Secondary | ICD-10-CM | POA: Diagnosis present

## 2020-01-05 DIAGNOSIS — F10129 Alcohol abuse with intoxication, unspecified: Secondary | ICD-10-CM | POA: Diagnosis present

## 2020-01-05 DIAGNOSIS — R569 Unspecified convulsions: Secondary | ICD-10-CM

## 2020-01-05 DIAGNOSIS — Y906 Blood alcohol level of 120-199 mg/100 ml: Secondary | ICD-10-CM | POA: Diagnosis present

## 2020-01-05 DIAGNOSIS — F411 Generalized anxiety disorder: Secondary | ICD-10-CM | POA: Diagnosis present

## 2020-01-05 DIAGNOSIS — F1410 Cocaine abuse, uncomplicated: Secondary | ICD-10-CM | POA: Diagnosis present

## 2020-01-05 DIAGNOSIS — Z20822 Contact with and (suspected) exposure to covid-19: Secondary | ICD-10-CM | POA: Diagnosis present

## 2020-01-05 DIAGNOSIS — G928 Other toxic encephalopathy: Secondary | ICD-10-CM | POA: Diagnosis present

## 2020-01-05 DIAGNOSIS — E876 Hypokalemia: Secondary | ICD-10-CM | POA: Diagnosis present

## 2020-01-05 DIAGNOSIS — T50902A Poisoning by unspecified drugs, medicaments and biological substances, intentional self-harm, initial encounter: Secondary | ICD-10-CM | POA: Diagnosis present

## 2020-01-05 DIAGNOSIS — C714 Malignant neoplasm of occipital lobe: Secondary | ICD-10-CM | POA: Diagnosis present

## 2020-01-05 DIAGNOSIS — F1511 Other stimulant abuse, in remission: Secondary | ICD-10-CM | POA: Diagnosis present

## 2020-01-05 DIAGNOSIS — F251 Schizoaffective disorder, depressive type: Secondary | ICD-10-CM | POA: Diagnosis present

## 2020-01-05 DIAGNOSIS — T50912A Poisoning by multiple unspecified drugs, medicaments and biological substances, intentional self-harm, initial encounter: Secondary | ICD-10-CM

## 2020-01-05 DIAGNOSIS — D696 Thrombocytopenia, unspecified: Secondary | ICD-10-CM | POA: Diagnosis present

## 2020-01-05 DIAGNOSIS — Z9152 Personal history of nonsuicidal self-harm: Secondary | ICD-10-CM | POA: Diagnosis not present

## 2020-01-05 DIAGNOSIS — T426X6A Underdosing of other antiepileptic and sedative-hypnotic drugs, initial encounter: Secondary | ICD-10-CM | POA: Diagnosis present

## 2020-01-05 LAB — BASIC METABOLIC PANEL
Anion gap: 11 (ref 5–15)
BUN: 7 mg/dL (ref 6–20)
CO2: 23 mmol/L (ref 22–32)
Calcium: 9.3 mg/dL (ref 8.9–10.3)
Chloride: 114 mmol/L — ABNORMAL HIGH (ref 98–111)
Creatinine, Ser: 0.85 mg/dL (ref 0.61–1.24)
GFR, Estimated: >60 mL/min (ref >60)
Glucose, Bld: 91 mg/dL (ref 70–99)
Potassium: 3.2 mmol/L — ABNORMAL LOW (ref 3.5–5.1)
Sodium: 148 mmol/L — ABNORMAL HIGH (ref 135–145)

## 2020-01-05 LAB — CBC
HCT: 43.3 % (ref 39.0–52.0)
Hemoglobin: 14.7 g/dL (ref 13.0–17.0)
MCH: 32.2 pg (ref 26.0–34.0)
MCHC: 33.9 g/dL (ref 30.0–36.0)
MCV: 94.7 fL (ref 80.0–100.0)
Platelets: 154 10*3/uL (ref 150–400)
RBC: 4.57 MIL/uL (ref 4.22–5.81)
RDW: 13.2 % (ref 11.5–15.5)
WBC: 8.8 10*3/uL (ref 4.0–10.5)
nRBC: 0 % (ref 0.0–0.2)

## 2020-01-05 LAB — MAGNESIUM: Magnesium: 2.1 mg/dL (ref 1.7–2.4)

## 2020-01-05 MED ORDER — LORAZEPAM 2 MG/ML IJ SOLN
1.0000 mg | INTRAMUSCULAR | Status: DC | PRN
  Administered 2020-01-05 – 2020-01-06 (×5): 1 mg via INTRAVENOUS
  Filled 2020-01-05 (×5): qty 1

## 2020-01-05 MED ORDER — POTASSIUM CHLORIDE CRYS ER 20 MEQ PO TBCR
40.0000 meq | EXTENDED_RELEASE_TABLET | Freq: Once | ORAL | Status: AC
  Administered 2020-01-05: 40 meq via ORAL
  Filled 2020-01-05: qty 2

## 2020-01-05 NOTE — Progress Notes (Signed)
EEG complete - results pending 

## 2020-01-05 NOTE — Procedures (Signed)
Patient Name: Stephen Beard  MRN: [id number]  Epilepsy Attending: Lora Havens  Referring Physician/Provider: Dr Karmen Bongo Date: 01/05/2020 Duration:   Patient history: 24yo M with R parietal anaplastic oligodendroglioma s/p craniotomy and resection on 07/02/19 and hematoma evacuation on 4/21 presented with seizure. EEG to evaluate for seizure  Level of alertness: Awake, asleep  AEDs during EEG study: None  Technical aspects: This EEG study was done with scalp electrodes positioned according to the 10-20 International system of electrode placement. Electrical activity was acquired at a sampling rate of 500Hz  and reviewed with a high frequency filter of 70Hz  and a low frequency filter of 1Hz . EEG data were recorded continuously and digitally stored.   Description: No  posterior dominant rhythm was seen. Sleep was characterized by vertex waves, sleep spindles (12 to 14 Hz), maximal frontocentral region.  There is an excessive amount of 15 to 18 Hz beta activity distributed symmetrically and diffusely.   Hyperventilation and photic stimulation were not performed.     ABNORMALITY -Excessive beta, generalized  IMPRESSION: This study is within normal limits. No seizures or epileptiform discharges were seen throughout the recording.The excessive beta activity seen in the background is most likely due to the effect of benzodiazepine and is a benign EEG pattern.   [name] [name]

## 2020-01-05 NOTE — Progress Notes (Signed)
PROGRESS NOTE    Stephen Beard  PYK:[id number] DOB: June 22, 1995 DOA: 01/03/2020 PCP: [facility]   Brief Narrative:  Stephen Beard is a 24 y.o. male with medical history significant of ADHD/psychosis/schizoaffective disorder and oligodendroglioma presenting with intentional overdose.  According to the chart, the patient took an uncertain number of pills, drank 1/2 of a fifth of vodka, and vomited about 30 minutes later.  He was able to provide history to Dr. Stark Jock and reported recent depression associated with medical problems as well as "his sexuality and relationships with friends."  He took Seroquel, Wellbutrin, and Buspar - all of which ere just prescribed on 10/20.  He had normal cognition according to Dr. Estanislado Pandy note.  At the time of my evaluation, the patient was quite stuporous with mostly unintelligible speech and he was clearly responding to voices and reacting to internal stimuli.  He exhibited periodic mildly threatening movements.  Between the EDP evaluation and mine, the patient had a witnessed seizure and was given Ativan. In ED patient reported he took Buspar, Wellbutrin, and "a bunch of other stuff".  Poison control recommends 24 obs.  Patient had a witnessed seizure, broke with Ativan. Neurology considering Wellbutrin but recommends loading with Keppra.  Assessment & Plan:   Principal Problem:   Drug overdose, multiple drugs, intentional self-harm, initial encounter [facility]) Active Problems:   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HCC)   Focal seizures ([name])   Schizoaffective disorder, depressive type ([place])   Intentional polypharmacy overdose, POA Unclear if suicidal ideation Notable history of schizoaffective disorder -Patient reportedly overdosed on newly prescribed on 10/20 - Wellbutrin, Seroquel, and BuSpar -Witnessed seizure in the ER and post-ictal/stuporous state following -Tylenol negative, repeat negative -Salicylate  negative -UDS positive for cocaine -Poison Control was contacted by the ER -EKG notable for peaked R to in aVR as well as widened QRS consistent with overdose, known to increase risk of seizures and cardiac dysrhythmias. -Patient continues to be somewhat altered, likely postictal as below -Landscape architect, suicide precautions, seizure precautions  -Appreciate psychiatry consult   Acute provoked seizure in the setting of above versus given recent craniotomy as below   -Neurology following, appreciate insight and recommendations -Likely acute provoked seizure secondary to above, increased risk given oligodendroglioma and recent surgery as below -previously on Lamictal -EEG this morning, formal read pending -Continue seizure precautions, Keppra, Ativan as needed for breakthrough events  Schizoaffective disorder -Psych consulted, appreciate insight recommendations  - Last seen in [facility] on 10/20; this was his initial psychiatric evaluation -He has depressed mood, difficulty concentrating, panic attacks, disturbed sleep, auditory hallucinations, and paranoia per family at bedside -He has h/o polysubstance abuse including methamphetamines, cocaine, alcohol, and benzos -His Seroquel was increased from 50 mg to 100 mg daily and he was started on Wellbutrin 150 and BuSpar 10 mg TID  -Hold all home medications for 24 to 48 hours, appreciate psychiatry assistance with timing for resuming medications given unknown quantity of Seroquel, Wellbutrin, BuSpar was taken on the 23rd - IVC paperwork completed by EDP  Oligodendroglioma -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 Mickeal Skinner, last seen on 9/23 and is due for appointment again this coming week   DVT  prophylaxis:  Lovenox  Code Status:  Full  Family Communication: At bedside  Status is: Inpatient  Dispo: The patient is from: Home              Anticipated d/c is to: To be determined              Anticipated d/c date is: Greater than 48 hours              Patient currently not medically stable for discharge given ongoing need for neurological, psychiatric evaluation, patient is not medically stable at this time for discharge given metabolic encephalopathy, high risk for recurrent seizure and polypharmacy overdose requiring close monitoring with telemetry, IV fluids and repeat labs.  Consultants:   Neurology, psychiatry  Procedures:   None planned  Antimicrobials:  None indicated  Subjective: No acute issues or events overnight, patient still quite somnolent, not interactive during interview  Objective: Vitals:   01/04/20 2053 01/05/20 0017 01/05/20 0101 01/05/20 0300  BP: (!) 158/98 (!) 157/87 (!) 138/99 (!) 149/92  Pulse: (!) 107 96 (!) 108 86  Resp: 20   20  Temp: 98.2 F (36.8 C) 98.9 F (37.2 C) 98.8 F (37.1 C) 98.8 F (37.1 C)  TempSrc: Axillary Oral Oral Oral  SpO2: 98% 97% 98% 98%  Weight:      Height:        Intake/Output Summary (Last 24 hours) at 01/05/2020 0902 Last data filed at 01/05/2020 0657 Gross per 24 hour  Intake --  Output 850 ml  Net -850 ml   Filed Weights   01/03/20 2255  Weight: 63.5 kg    Examination:  General:  Pleasantly resting in bed, No acute distress.  Unable to assess orientation due to mental status HEENT:  Normocephalic atraumatic.  Sclerae nonicteric, noninjected.  Extraocular movements intact bilaterally. Neck:  Without mass or deformity.  Trachea is midline. Lungs:  Clear to auscultate bilaterally without rhonchi, wheeze, or rales. Heart:  Regular rate and rhythm.  Without murmurs, rubs, or gallops. Abdomen:  Soft, nontender, nondistended.  Without guarding or rebound. Extremities: Without cyanosis, clubbing, edema,  or obvious deformity. Vascular:  Dorsalis pedis and posterior tibial pulses palpable bilaterally. Skin:  Warm and dry, no erythema, no ulcerations.   Data Reviewed: I have personally reviewed following labs and imaging studies  CBC: Recent Labs  Lab 01/03/20 2258 01/05/20 0145  WBC 3.2* 8.8  HGB 16.1 14.7  HCT 45.3 43.3  MCV 92.8 94.7  PLT 128* 741   Basic Metabolic Panel: Recent Labs  Lab 01/03/20 2258 01/04/20 0620 01/04/20 1644 01/05/20 0145  NA 139  --  148* 148*  K 3.2*  --  3.6 3.2*  CL 104  --  112* 114*  CO2 22  --  22 23  GLUCOSE 107*  --  110* 91  BUN 10  --  8 7  CREATININE 0.86  --  1.02 0.85  CALCIUM 8.6*  --  9.4 9.3  MG  --  2.3 2.2 2.1   GFR: Estimated Creatinine Clearance: 119.3 mL/min (by C-G formula based on SCr of 0.85 mg/dL). Liver Function Tests: Recent Labs  Lab 01/03/20 2258  AST 24  ALT 32  ALKPHOS 73  BILITOT 0.5  PROT 7.0  ALBUMIN 4.4   No results for input(s): LIPASE, AMYLASE in the last 168 hours. No results for input(s): AMMONIA in the last 168 hours. Coagulation Profile: No results for input(s): INR, PROTIME in the last 168 hours. Cardiac Enzymes: No results for input(s): CKTOTAL, CKMB, CKMBINDEX, TROPONINI in the last 168 hours. BNP (  last 3 results) No results for input(s): PROBNP in the last 8760 hours. HbA1C: No results for input(s): HGBA1C in the last 72 hours. CBG: Recent Labs  Lab 01/03/20 2314  GLUCAP 105*   Lipid Profile: No results for input(s): CHOL, HDL, LDLCALC, TRIG, CHOLHDL, LDLDIRECT in the last 72 hours. Thyroid Function Tests: No results for input(s): TSH, T4TOTAL, FREET4, T3FREE, THYROIDAB in the last 72 hours. Anemia Panel: No results for input(s): VITAMINB12, FOLATE, FERRITIN, TIBC, IRON, RETICCTPCT in the last 72 hours. Sepsis Labs: No results for input(s): PROCALCITON, LATICACIDVEN in the last 168 hours.  Recent Results (from the past 240 hour(s))  Respiratory Panel by RT PCR (Flu A&B,  Covid) - Nasopharyngeal Swab     Status: None   Collection Time: 01/04/20  5:50 AM   Specimen: Nasopharyngeal Swab  Result Value Ref Range Status   SARS Coronavirus 2 by RT PCR NEGATIVE NEGATIVE Final    Comment: (NOTE) SARS-CoV-2 target nucleic acids are NOT DETECTED.  The SARS-CoV-2 RNA is generally detectable in upper respiratoy specimens during the acute phase of infection. The lowest concentration of SARS-CoV-2 viral copies this assay can detect is 131 copies/mL. A negative result does not preclude SARS-Cov-2 infection and should not be used as the sole basis for treatment or other patient management decisions. A negative result may occur with  improper specimen collection/handling, submission of specimen other than nasopharyngeal swab, presence of viral mutation(s) within the areas targeted by this assay, and inadequate number of viral copies (<131 copies/mL). A negative result must be combined with clinical observations, patient history, and epidemiological information. The expected result is Negative.  Fact Sheet for Patients:  PinkCheek.be  Fact Sheet for Healthcare Providers:  GravelBags.it  This test is no t yet approved or cleared by the [place] FDA and  has been authorized for detection and/or diagnosis of SARS-CoV-2 by FDA under an Emergency Use Authorization (EUA). This EUA will remain  in effect (meaning this test can be used) for the duration of the COVID-19 declaration under Section 564(b)(1) of the Act, 21 U.S.C. section 360bbb-3(b)(1), unless the authorization is terminated or revoked sooner.     Influenza A by PCR NEGATIVE NEGATIVE Final   Influenza B by PCR NEGATIVE NEGATIVE Final    Comment: (NOTE) The Xpert Xpress SARS-CoV-2/FLU/RSV assay is intended as an aid in  the diagnosis of influenza from Nasopharyngeal swab specimens and  should not be used as a sole basis for treatment. Nasal  washings and  aspirates are unacceptable for Xpert Xpress SARS-CoV-2/FLU/RSV  testing.  Fact Sheet for Patients: PinkCheek.be  Fact Sheet for Healthcare Providers: GravelBags.it  This test is not yet approved or cleared by the [place] FDA and  has been authorized for detection and/or diagnosis of SARS-CoV-2 by  FDA under an Emergency Use Authorization (EUA). This EUA will remain  in effect (meaning this test can be used) for the duration of the  Covid-19 declaration under Section 564(b)(1) of the Act, 21  U.S.C. section 360bbb-3(b)(1), unless the authorization is  terminated or revoked. Performed at [facility], [place] [address], [place], [place] [address]      Radiology Studies: CT Head Wo Contrast  Result Date: 01/04/2020 CLINICAL DATA:  Seizure and encephalopathy. EXAM: CT HEAD WITHOUT CONTRAST TECHNIQUE: Contiguous axial images were obtained from the base of the skull through the vertex without intravenous contrast. COMPARISON:  07/03/2019 FINDINGS: Brain: Right occipital encephalomalacia. No hemorrhage or extra-axial collection. Ex vacuo dilatation of the right  lateral ventricle. Vascular: No abnormal hyperdensity of the major intracranial arteries or dural venous sinuses. No intracranial atherosclerosis. Skull: Right posterior remote craniotomy site. Sinuses/Orbits: No fluid levels or advanced mucosal thickening of the visualized paranasal sinuses. No mastoid or middle ear effusion. The orbits are normal. IMPRESSION: 1. No acute intracranial abnormality. 2. Right occipital encephalomalacia and remote craniotomy site. Electronically Signed   By: Ulyses Jarred M.D.   On: 01/04/2020 05:51    Scheduled Meds: . docusate sodium  100 mg Oral BID  . enoxaparin (LOVENOX) injection  40 mg Subcutaneous Q24H  . QUEtiapine  50 mg Oral QHS   Continuous Infusions: . sodium chloride 125 mL/hr at 01/04/20 1604    LOS: 0  days   Time spent: 9min  Peng Thorstenson C Haim Hansson, DO [facility]  If 7PM-7AM, please contact night-coverage [email or web address]  01/05/2020, 9:02 AM

## 2020-01-05 NOTE — Consult Note (Signed)
Patient seen but he appears confused and unable to participate in psychiatric evaluation despite multiple prompts.  Re-consult psychiatric service when patient is alert and able to participate in psychiatric evaluation.  Corena Pilgrim, MD Attending psychiatrist

## 2020-01-06 ENCOUNTER — Inpatient Hospital Stay: Payer: Medicaid Other

## 2020-01-06 ENCOUNTER — Inpatient Hospital Stay: Payer: Medicaid Other | Admitting: Internal Medicine

## 2020-01-06 LAB — COMPREHENSIVE METABOLIC PANEL
ALT: 30 U/L (ref 0–44)
AST: 39 U/L (ref 15–41)
Albumin: 3.9 g/dL (ref 3.5–5.0)
Alkaline Phosphatase: 66 U/L (ref 38–126)
Anion gap: 11 (ref 5–15)
BUN: 5 mg/dL — ABNORMAL LOW (ref 6–20)
CO2: 20 mmol/L — ABNORMAL LOW (ref 22–32)
Calcium: 9 mg/dL (ref 8.9–10.3)
Chloride: 107 mmol/L (ref 98–111)
Creatinine, Ser: 0.79 mg/dL (ref 0.61–1.24)
GFR, Estimated: >60 mL/min (ref >60)
Glucose, Bld: 92 mg/dL (ref 70–99)
Potassium: 3.4 mmol/L — ABNORMAL LOW (ref 3.5–5.1)
Sodium: 138 mmol/L (ref 135–145)
Total Bilirubin: 1.5 mg/dL — ABNORMAL HIGH (ref 0.3–1.2)
Total Protein: 6.5 g/dL (ref 6.5–8.1)

## 2020-01-06 LAB — CBC
HCT: 42.2 % (ref 39.0–52.0)
Hemoglobin: 14.6 g/dL (ref 13.0–17.0)
MCH: 32.4 pg (ref 26.0–34.0)
MCHC: 34.6 g/dL (ref 30.0–36.0)
MCV: 93.8 fL (ref 80.0–100.0)
Platelets: 132 10*3/uL — ABNORMAL LOW (ref 150–400)
RBC: 4.5 MIL/uL (ref 4.22–5.81)
RDW: 12.5 % (ref 11.5–15.5)
WBC: 5.9 10*3/uL (ref 4.0–10.5)
nRBC: 0 % (ref 0.0–0.2)

## 2020-01-06 LAB — PHOSPHORUS: Phosphorus: 2.9 mg/dL (ref 2.5–4.6)

## 2020-01-06 LAB — MAGNESIUM: Magnesium: 2 mg/dL (ref 1.7–2.4)

## 2020-01-06 NOTE — Consult Note (Signed)
Patient seen and evaluated in person by this provider.  He was sleeping without distress.  His mother is at his bedside and provides some information.  Stephen Beard had a brain tumor removed in April with the inability to remove the whole tumor.  Since then he has been hearing the voice of a lady daily.  She was referred to outpatient on 10/20 and he started Wellbutrin.  At a later appointment, he was started on Seroquel.  On Friday, he was acting "normally" with no signs of distress before taking "all his medicines".  He is nonverbal at this time and has not regained his verbal skills.  Psychiatry will continue to re-evaluate tomorrow.  Waylan Boga, PMHNP

## 2020-01-06 NOTE — Progress Notes (Signed)
PROGRESS NOTE    Stephen Beard  ZOX:[id number] DOB: [birth year] DOA: 01/03/2020 PCP: [facility]   Brief Narrative:  Stephen Beard is a 24 y.o. male with medical history significant of ADHD/psychosis/schizoaffective disorder and oligodendroglioma presenting with intentional overdose.  According to the chart, the patient took an uncertain number of pills, drank 1/2 of a fifth of vodka, and vomited about 30 minutes later.  He was able to provide history to Dr. Stark Beard and reported recent depression associated with medical problems as well as "his sexuality and relationships with friends."  He took Seroquel, Wellbutrin, and Buspar - all of which ere just prescribed on 10/20.  He had normal cognition according to Dr. Estanislado Beard note.  At the time of my evaluation, the patient was quite stuporous with mostly unintelligible speech and he was clearly responding to voices and reacting to internal stimuli.  He exhibited periodic mildly threatening movements.  Between the EDP evaluation and mine, the patient had a witnessed seizure and was given Ativan. In ED patient reported he took Buspar, Wellbutrin, and "a bunch of other stuff".  Poison control recommends 24 obs.  Patient had a witnessed seizure, broke with Ativan. Neurology considering Wellbutrin but recommends loading with Keppra.  Assessment & Plan:   Principal Problem:   Drug overdose, multiple drugs, intentional self-harm, initial encounter Stephen Beard) Active Problems:   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HCC)   Focal seizures (HCC)   Schizoaffective disorder, depressive type ([name])   Intentional overdose of drug in tablet form ([name])  Intentional polypharmacy overdose, POA Unclear if suicidal ideation Notable history of schizoaffective disorder -Patient reportedly overdosed on newly prescribed on 10/20 - Wellbutrin, Seroquel, and BuSpar -Witnessed seizure in the ER and post-ictal/stuporous state  following -Tylenol negative, repeat remains negative -Salicylate negative -UDS positive for cocaine -EKG notable for peaked R to in aVR as well as widened QRS consistent with overdose, known to increase risk of seizures and cardiac dysrhythmias. -Patient much more awake alert and interactive today, psychiatry reconsulted for further evaluation given intentional overdose, patient admits to taking the medications "so that I could nap and not have to wake up" but he declines specifically trying to "kill himself" per our discussion -Continue sitter, suicide precautions, seizure precautions  -Appreciate psychiatry consult -further medication management and help with possible disposition to inpatient psych per their expertise.  Acute provoked seizure in the setting of above versus given recent craniotomy as below   -Neurology following, appreciate insight and recommendations -Likely acute provoked seizure secondary to above, increased risk given oligodendroglioma and recent surgery as below -previously on Lamictal -EEG without epileptiform discharge -Continue seizure precautions, Keppra, Ativan as needed for breakthrough events  Schizoaffective disorder -Psych consulted, appreciate insight recommendations  - Last seen in Stephen Beard on 10/20; this was his initial psychiatric evaluation -He has depressed mood, difficulty concentrating, panic attacks, disturbed sleep, auditory hallucinations, and paranoia per family at bedside -He has h/o polysubstance abuse including methamphetamines, cocaine, alcohol, and benzos -His Seroquel was increased from 50 mg to 100 mg daily and he was started on Wellbutrin 150 and BuSpar 10 mg TID  -Hold all home medications for 24 to 48 hours, appreciate psychiatry assistance with timing for resuming medications given unknown quantity of Seroquel, Wellbutrin, BuSpar was taken on the 23rd - IVC paperwork completed by EDP  Oligodendroglioma -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 Mickeal Beard, last seen on 9/23 and is due for appointment again this coming week   DVT prophylaxis:  Lovenox  Code Status:  Full  Family Communication: None present/available  Status is: Inpatient  Dispo: The patient is from: Home              Anticipated d/c is to: To be determined              Anticipated d/c date is: Greater than 48 hours              Patient currently not medically stable for discharge given ongoing need for neurological, psychiatric evaluation, patient is not medically stable at this time for discharge given metabolic encephalopathy, high risk for recurrent seizure and polypharmacy overdose requiring close monitoring with telemetry, IV fluids and repeat labs.  Consultants:   Neurology, psychiatry  Procedures:   None planned  Antimicrobials:  None indicated  Subjective: No acute issues or events overnight, patient much more awake alert oriented this morning to person and time, presumed we were at someone's condo/house but able to orient back to hospital fairly easily.  Objective: Vitals:   01/05/20 1900 01/05/20 2317 01/06/20 0304 01/06/20 0442  BP: 103/76 (!) 128/96 125/90 132/80  Pulse: 71 73 73 71  Resp: 18 15 16 18   Temp: 98.2 F (36.8 C) 98.4 F (36.9 C) 98.4 F (36.9 C) 98.5 F (36.9 C)  TempSrc: Axillary Oral Oral Oral  SpO2: 97% 97% 97% 98%  Weight:      Height:        Intake/Output Summary (Last 24 hours) at 01/06/2020 0705 Last data filed at 01/06/2020 0558 Gross per 24 hour  Intake 1250 ml  Output 1550 ml  Net -300 ml   Filed Weights   01/03/20 2255  Weight: 63.5 kg    Examination:  General:  Pleasantly resting in bed, No acute distress.  Oriented to person and time, not  oriented to place.  Speech rambling " flight of ideas" HEENT:  Normocephalic atraumatic.  Sclerae nonicteric, noninjected.  Extraocular movements intact bilaterally. Neck:  Without mass or deformity.  Trachea is midline. Lungs:  Clear to auscultate bilaterally without rhonchi, wheeze, or rales. Heart:  Regular rate and rhythm.  Without murmurs, rubs, or gallops. Abdomen:  Soft, nontender, nondistended.  Without guarding or rebound. Extremities: Without cyanosis, clubbing, edema, or obvious deformity. Vascular:  Dorsalis pedis and posterior tibial pulses palpable bilaterally. Skin:  Warm and dry, no erythema, no ulcerations.  Data Reviewed: I have personally reviewed following labs and imaging studies  CBC: Recent Labs  Lab 01/03/20 2258 01/05/20 0145 01/06/20 0349  WBC 3.2* 8.8 5.9  HGB 16.1 14.7 14.6  HCT 45.3 43.3 42.2  MCV 92.8 94.7 93.8  PLT 128* 154 956*   Basic Metabolic Panel: Recent Labs  Lab 01/03/20 2258 01/04/20 0620 01/04/20 1644 01/05/20 0145 01/06/20 0349  NA 139  --  148* 148* 138  K 3.2*  --  3.6 3.2* 3.4*  CL 104  --  112* 114* 107  CO2 22  --  22 23 20*  GLUCOSE 107*  --  110* 91 92  BUN 10  --  8 7 5*  CREATININE 0.86  --  1.02 0.85 0.79  CALCIUM 8.6*  --  9.4 9.3 9.0  MG  --  2.3 2.2 2.1 2.0  PHOS  --   --   --   --  2.9   GFR: Estimated Creatinine Clearance: 126.8 mL/min (by C-G formula based on SCr of 0.79 mg/dL). Liver Function Tests: Recent Labs  Lab 01/03/20 2258 01/06/20 0349  AST 24 39  ALT 32 30  ALKPHOS 73 66  BILITOT 0.5 1.5*  PROT 7.0 6.5  ALBUMIN 4.4 3.9   No results for input(s): LIPASE, AMYLASE in the last 168 hours. No results for input(s): AMMONIA in the last 168 hours. Coagulation Profile: No results for input(s): INR, PROTIME in the last 168 hours. Cardiac Enzymes: No results for input(s): CKTOTAL, CKMB, CKMBINDEX, TROPONINI in the last 168 hours. BNP (last 3 results) No results for input(s): PROBNP in the last 8760  hours. HbA1C: No results for input(s): HGBA1C in the last 72 hours. CBG: Recent Labs  Lab 01/03/20 2314  GLUCAP 105*   Lipid Profile: No results for input(s): CHOL, HDL, LDLCALC, TRIG, CHOLHDL, LDLDIRECT in the last 72 hours. Thyroid Function Tests: No results for input(s): TSH, T4TOTAL, FREET4, T3FREE, THYROIDAB in the last 72 hours. Anemia Panel: No results for input(s): VITAMINB12, FOLATE, FERRITIN, TIBC, IRON, RETICCTPCT in the last 72 hours. Sepsis Labs: No results for input(s): PROCALCITON, LATICACIDVEN in the last 168 hours.  Recent Results (from the past 240 hour(s))  Respiratory Panel by RT PCR (Flu A&B, Covid) - Nasopharyngeal Swab     Status: None   Collection Time: 01/04/20  5:50 AM   Specimen: Nasopharyngeal Swab  Result Value Ref Range Status   SARS Coronavirus 2 by RT PCR NEGATIVE NEGATIVE Final    Comment: (NOTE) SARS-CoV-2 target nucleic acids are NOT DETECTED.  The SARS-CoV-2 RNA is generally detectable in upper respiratoy specimens during the acute phase of infection. The lowest concentration of SARS-CoV-2 viral copies this assay can detect is 131 copies/mL. A negative result does not preclude SARS-Cov-2 infection and should not be used as the sole basis for treatment or other patient management decisions. A negative result may occur with  improper specimen collection/handling, submission of specimen other than nasopharyngeal swab, presence of viral mutation(s) within the areas targeted by this assay, and inadequate number of viral copies (<131 copies/mL). A negative result must be combined with clinical observations, patient history, and epidemiological information. The expected result is Negative.  Fact Sheet for Patients:  PinkCheek.be  Fact Sheet for Healthcare Providers:  GravelBags.it  This test is no t yet approved or cleared by the [place] FDA and  has been authorized for detection  and/or diagnosis of SARS-CoV-2 by FDA under an Emergency Use Authorization (EUA). This EUA will remain  in effect (meaning this test can be used) for the duration of the COVID-19 declaration under Section 564(b)(1) of the Act, 21 U.S.C. section 360bbb-3(b)(1), unless the authorization is terminated or revoked sooner.     Influenza A by PCR NEGATIVE NEGATIVE Final   Influenza B by PCR NEGATIVE NEGATIVE Final    Comment: (NOTE) The Xpert Xpress SARS-CoV-2/FLU/RSV assay is intended as an aid in  the diagnosis of influenza from Nasopharyngeal swab specimens and  should not be used as a sole basis for treatment. Nasal washings and  aspirates are unacceptable for Xpert Xpress SARS-CoV-2/FLU/RSV  testing.  Fact Sheet for Patients: PinkCheek.be  Fact Sheet for Healthcare Providers: GravelBags.it  This test is not yet approved or cleared by the [place] FDA and  has been authorized for detection and/or diagnosis of SARS-CoV-2 by  FDA under an Emergency Use Authorization (EUA). This EUA will remain  in effect (meaning this test can  be used) for the duration of the  Covid-19 declaration under Section 564(b)(1) of the Act, 21  U.S.C. section 360bbb-3(b)(1), unless the authorization is  terminated or revoked. Performed at [facility], [address], [place], [place] [address]      Radiology Studies: EEG adult  Result Date: 01/25/20 Lora Havens, MD     01-25-2020 10:26 AM Patient Name: OLAN KUREK MRN: [id number] Epilepsy Attending: Lora Havens Referring Physician/Provider: Dr Karmen Bongo Date: 2020-01-25 Duration: Patient history: 24yo M with R parietal anaplastic oligodendroglioma s/p craniotomy and resection on 07/02/19 and hematoma evacuation on 4/21 presented with seizure. EEG to evaluate for seizure Level of alertness: Awake, asleep AEDs during EEG study: None Technical aspects: This EEG study  was done with scalp electrodes positioned according to the 10-20 International system of electrode placement. Electrical activity was acquired at a sampling rate of 500Hz  and reviewed with a high frequency filter of 70Hz  and a low frequency filter of 1Hz . EEG data were recorded continuously and digitally stored. Description: No  posterior dominant rhythm was seen. Sleep was characterized by vertex waves, sleep spindles (12 to 14 Hz), maximal frontocentral region.  There is an excessive amount of 15 to 18 Hz beta activity distributed symmetrically and diffusely.   Hyperventilation and photic stimulation were not performed.   ABNORMALITY -Excessive beta, generalized IMPRESSION: This study is within normal limits. No seizures or epileptiform discharges were seen throughout the recording.The excessive beta activity seen in the background is most likely due to the effect of benzodiazepine and is a benign EEG pattern. Priyanka [name]    Scheduled Meds: . docusate sodium  100 mg Oral BID  . enoxaparin (LOVENOX) injection  40 mg Subcutaneous Q24H  . QUEtiapine  50 mg Oral QHS   Continuous Infusions: . sodium chloride 125 mL/hr at 2020-01-25 1323    LOS: 1 day   Time spent: 92min  Britteny Fiebelkorn C Heath Tesler, DO [facility]  If 7PM-7AM, please contact night-coverage [email or web address]  01/06/2020, 7:05 AM

## 2020-01-06 NOTE — Plan of Care (Signed)
Pt is more alert and calm this evening. Pt stated the PRN Ativan helped ease his anxiety. He is showing progress towards his level of anxiety decreasing.  Problem: Nutrition: Goal: Adequate nutrition will be maintained 01/06/2020 1815 by Cassell Smiles, RN Outcome: Progressing 01/06/2020 1815 by Cassell Smiles, RN Outcome: Not Progressing   Problem: Coping: Goal: Level of anxiety will decrease 01/06/2020 1815 by Cassell Smiles, RN Outcome: Progressing 01/06/2020 1815 by Cassell Smiles, RN Outcome: Not Progressing   Problem: Health Behavior/Discharge Planning: Goal: Ability to manage health-related needs will improve 01/06/2020 1815 by Cassell Smiles, RN Outcome: Not Progressing 01/06/2020 1815 by Cassell Smiles, RN Outcome: Not Progressing   Problem: Activity: Goal: Risk for activity intolerance will decrease 01/06/2020 1815 by Cassell Smiles, RN Outcome: Not Progressing 01/06/2020 1815 by Cassell Smiles, RN Outcome: Not Progressing   Problem: Safety: Goal: Ability to remain free from injury will improve 01/06/2020 1815 by Cassell Smiles, RN Outcome: Not Progressing 01/06/2020 1815 by Cassell Smiles, RN Outcome: Not Progressing

## 2020-01-07 ENCOUNTER — Inpatient Hospital Stay (HOSPITAL_COMMUNITY)
Admission: AD | Admit: 2020-01-07 | Discharge: 2020-01-12 | DRG: 885 | Disposition: A | Discharge status: Home / Self Care | Payer: Medicaid Other | Source: Intra-hospital | Attending: Psychiatry | Admitting: Psychiatry

## 2020-01-07 DIAGNOSIS — F17220 Nicotine dependence, chewing tobacco, uncomplicated: Secondary | ICD-10-CM | POA: Diagnosis present

## 2020-01-07 DIAGNOSIS — G4700 Insomnia, unspecified: Secondary | ICD-10-CM | POA: Diagnosis present

## 2020-01-07 DIAGNOSIS — F1490 Cocaine use, unspecified, uncomplicated: Secondary | ICD-10-CM | POA: Diagnosis present

## 2020-01-07 DIAGNOSIS — R440 Auditory hallucinations: Secondary | ICD-10-CM | POA: Diagnosis not present

## 2020-01-07 DIAGNOSIS — C719 Malignant neoplasm of brain, unspecified: Secondary | ICD-10-CM | POA: Diagnosis present

## 2020-01-07 DIAGNOSIS — R569 Unspecified convulsions: Secondary | ICD-10-CM | POA: Diagnosis not present

## 2020-01-07 DIAGNOSIS — F1010 Alcohol abuse, uncomplicated: Secondary | ICD-10-CM | POA: Diagnosis present

## 2020-01-07 DIAGNOSIS — Z9151 Personal history of suicidal behavior: Secondary | ICD-10-CM

## 2020-01-07 DIAGNOSIS — F39 Unspecified mood [affective] disorder: Secondary | ICD-10-CM | POA: Diagnosis present

## 2020-01-07 DIAGNOSIS — T50912A Poisoning by multiple unspecified drugs, medicaments and biological substances, intentional self-harm, initial encounter: Secondary | ICD-10-CM | POA: Diagnosis not present

## 2020-01-07 DIAGNOSIS — F17210 Nicotine dependence, cigarettes, uncomplicated: Secondary | ICD-10-CM | POA: Diagnosis present

## 2020-01-07 DIAGNOSIS — F411 Generalized anxiety disorder: Secondary | ICD-10-CM | POA: Diagnosis present

## 2020-01-07 DIAGNOSIS — Z923 Personal history of irradiation: Secondary | ICD-10-CM

## 2020-01-07 DIAGNOSIS — F251 Schizoaffective disorder, depressive type: Principal | ICD-10-CM | POA: Diagnosis present

## 2020-01-07 DIAGNOSIS — Z79899 Other long term (current) drug therapy: Secondary | ICD-10-CM | POA: Diagnosis not present

## 2020-01-07 DIAGNOSIS — Z720 Tobacco use: Secondary | ICD-10-CM | POA: Diagnosis not present

## 2020-01-07 DIAGNOSIS — F333 Major depressive disorder, recurrent, severe with psychotic symptoms: Secondary | ICD-10-CM

## 2020-01-07 DIAGNOSIS — F29 Unspecified psychosis not due to a substance or known physiological condition: Secondary | ICD-10-CM | POA: Diagnosis not present

## 2020-01-07 DIAGNOSIS — F902 Attention-deficit hyperactivity disorder, combined type: Secondary | ICD-10-CM

## 2020-01-07 HISTORY — DX: Malignant neoplasm of frontal lobe: C71.1

## 2020-01-07 HISTORY — DX: Unspecified convulsions: R56.9

## 2020-01-07 HISTORY — DX: Unspecified psychosis not due to a substance or known physiological condition: F29

## 2020-01-07 HISTORY — DX: Headache, unspecified: R51.9

## 2020-01-07 HISTORY — DX: Other specified behavioral and emotional disorders with onset usually occurring in childhood and adolescence: F98.8

## 2020-01-07 LAB — COMPREHENSIVE METABOLIC PANEL
ALT: 26 U/L (ref 0–44)
AST: 27 U/L (ref 15–41)
Albumin: 3.9 g/dL (ref 3.5–5.0)
Alkaline Phosphatase: 70 U/L (ref 38–126)
Anion gap: 8 (ref 5–15)
BUN: 10 mg/dL (ref 6–20)
CO2: 25 mmol/L (ref 22–32)
Calcium: 9 mg/dL (ref 8.9–10.3)
Chloride: 107 mmol/L (ref 98–111)
Creatinine, Ser: 0.89 mg/dL (ref 0.61–1.24)
GFR, Estimated: >60 mL/min (ref >60)
Glucose, Bld: 99 mg/dL (ref 70–99)
Potassium: 3.3 mmol/L — ABNORMAL LOW (ref 3.5–5.1)
Sodium: 140 mmol/L (ref 135–145)
Total Bilirubin: 1 mg/dL (ref 0.3–1.2)
Total Protein: 6.3 g/dL — ABNORMAL LOW (ref 6.5–8.1)

## 2020-01-07 LAB — PHOSPHORUS: Phosphorus: 4.1 mg/dL (ref 2.5–4.6)

## 2020-01-07 LAB — CBC
HCT: 43.8 % (ref 39.0–52.0)
Hemoglobin: 15.2 g/dL (ref 13.0–17.0)
MCH: 32.3 pg (ref 26.0–34.0)
MCHC: 34.7 g/dL (ref 30.0–36.0)
MCV: 93.2 fL (ref 80.0–100.0)
Platelets: 144 10*3/uL — ABNORMAL LOW (ref 150–400)
RBC: 4.7 MIL/uL (ref 4.22–5.81)
RDW: 12.2 % (ref 11.5–15.5)
WBC: 4.8 10*3/uL (ref 4.0–10.5)
nRBC: 0 % (ref 0.0–0.2)

## 2020-01-07 LAB — RESPIRATORY PANEL BY RT PCR (FLU A&B, COVID)
Influenza A by PCR: NEGATIVE
Influenza B by PCR: NEGATIVE
SARS Coronavirus 2 by RT PCR: NEGATIVE

## 2020-01-07 LAB — MAGNESIUM: Magnesium: 2.1 mg/dL (ref 1.7–2.4)

## 2020-01-07 MED ORDER — DOCUSATE SODIUM 100 MG PO CAPS
100.0000 mg | ORAL_CAPSULE | Freq: Two times a day (BID) | ORAL | Status: DC
  Administered 2020-01-07 – 2020-01-09 (×4): 100 mg via ORAL
  Filled 2020-01-07 (×7): qty 1

## 2020-01-07 MED ORDER — ACETAMINOPHEN 650 MG RE SUPP
650.0000 mg | RECTAL | Status: DC | PRN
  Filled 2020-01-07: qty 1

## 2020-01-07 MED ORDER — LAMOTRIGINE 25 MG PO TABS
50.0000 mg | ORAL_TABLET | Freq: Once | ORAL | Status: AC
  Administered 2020-01-07: 50 mg via ORAL
  Filled 2020-01-07 (×2): qty 2

## 2020-01-07 MED ORDER — LAMOTRIGINE 25 MG PO TABS
50.0000 mg | ORAL_TABLET | Freq: Every day | ORAL | 0 refills | Status: DC
Start: 2020-01-07 — End: 2020-01-12

## 2020-01-07 MED ORDER — LAMOTRIGINE 100 MG PO TABS: 100.0000 mg | ORAL_TABLET | Freq: Two times a day (BID) | ORAL | Status: DC

## 2020-01-07 MED ORDER — LAMOTRIGINE 25 MG PO TABS: 50.0000 mg | ORAL_TABLET | Freq: Every day | ORAL | Status: DC

## 2020-01-07 MED ORDER — MAGNESIUM HYDROXIDE 400 MG/5ML PO SUSP: 30.0000 mL | Freq: Every day | ORAL | Status: DC | PRN

## 2020-01-07 MED ORDER — NICOTINE 21 MG/24HR TD PT24
21.0000 mg | MEDICATED_PATCH | Freq: Every day | TRANSDERMAL | Status: DC
  Administered 2020-01-07: 21 mg via TRANSDERMAL
  Filled 2020-01-07: qty 1

## 2020-01-07 MED ORDER — NICOTINE 21 MG/24HR TD PT24
21.0000 mg | MEDICATED_PATCH | Freq: Every day | TRANSDERMAL | Status: DC
  Administered 2020-01-08 – 2020-01-12 (×5): 21 mg via TRANSDERMAL
  Filled 2020-01-07 (×8): qty 1

## 2020-01-07 MED ORDER — ACETAMINOPHEN 325 MG PO TABS: 650.0000 mg | ORAL_TABLET | ORAL | Status: DC | PRN

## 2020-01-07 MED ORDER — LAMOTRIGINE 100 MG PO TABS
100.0000 mg | ORAL_TABLET | Freq: Two times a day (BID) | ORAL | 0 refills | Status: DC
Start: 2020-01-08 — End: 2020-01-12

## 2020-01-07 MED ORDER — BUSPIRONE HCL 10 MG PO TABS
10.0000 mg | ORAL_TABLET | Freq: Every day | ORAL | Status: DC
  Administered 2020-01-08 – 2020-01-09 (×2): 10 mg via ORAL
  Filled 2020-01-07: qty 2
  Filled 2020-01-07 (×3): qty 1

## 2020-01-07 MED ORDER — ALUM & MAG HYDROXIDE-SIMETH 200-200-20 MG/5ML PO SUSP: 30.0000 mL | ORAL | Status: DC | PRN

## 2020-01-07 MED ORDER — LAMOTRIGINE 25 MG PO TABS
25.0000 mg | ORAL_TABLET | Freq: Once | ORAL | Status: AC
  Administered 2020-01-07: 25 mg via ORAL
  Filled 2020-01-07: qty 1

## 2020-01-07 MED ORDER — QUETIAPINE FUMARATE 100 MG PO TABS
100.0000 mg | ORAL_TABLET | Freq: Every day | ORAL | Status: DC
  Administered 2020-01-07: 100 mg via ORAL
  Filled 2020-01-07 (×3): qty 1

## 2020-01-07 NOTE — Progress Notes (Signed)
RN attempted to give report. Receiving notified to call within the next 30 minutes to an hour.

## 2020-01-07 NOTE — Consult Note (Addendum)
Neurology Consultation Reason for Consult: seizure Referring Physician: Dr Holli Humbles  CC: Intentional overdose  History is obtained from: patient, chart review  HPI: Stephen Beard is a 24 y.o. male with past medical history of bifrontal oligodendroglioma status post resection, multiple psych comorbidities and seizures who was admitted after intentional overdose on 01/03/2020.   Per review of Dr. Mickeal Skinner (neuro oncologist) note, patient has been on lamotrigine 100 mg twice daily for seizures, has been non-compliant with medications.  Patient describes his seizures as vision disturbance in left eye described as objects starting to appear larger in size.  He states he used to have these episodes few times every week, usually when he forgets to take his medicine.  He denies any side effects on current dose of the hospital.  Of note, patient was seen by psychiatry on 01/01/2020 and started on Wellbutrin as well as buspirone.   ROS: All other systems reviewed and negative except as noted in the HPI.    Past Medical History:  Diagnosis Date  . ADHD    History reviewed. No pertinent family history.  Social History:  reports that he has been smoking. His smokeless tobacco use includes chew. He reports current alcohol use. He reports that he does not use drugs.  Exam: Current vital signs: BP 118/78 (BP Location: Right Arm)   Pulse 85   Temp 97.7 F (36.5 C) (Oral)   Resp 14   Ht 5\' 7"  (1.702 m)   Wt 63.5 kg   SpO2 100%   BMI 21.93 kg/m  Vital signs in last 24 hours: Temp:  [97.7 F (36.5 C)-98.7 F (37.1 C)] 97.7 F (36.5 C) (10/26 0842) Pulse Rate:  [58-85] 85 (10/26 0842) Resp:  [14-18] 14 (10/26 0842) BP: (115-119)/(74-79) 118/78 (10/26 0842) SpO2:  [97 %-100 %] 100 % (10/26 0842)   Physical Exam  Constitutional: Appears well-developed and well-nourished.  Psych: Reports hearing a woman telling her that she has committed a crime and requesting a lawyer or  police. Eyes: No scleral injection HENT: No OP obstrucion Head: Normocephalic.  Cardiovascular: Normal rate and regular rhythm.  Respiratory: Effort normal, non-labored breathing GI: Soft.  No distension. There is no tenderness.  Skin: Warm, no apparent ulcers Neuro: AOx3, cranial nerves 2-12 grossly intact except left hemianopsia, 5/5 in all 4 extremities, sensation grossly intact except left sensory hemineglect.  I have reviewed labs in epic and the results pertinent to this consultation are: Platelets 144, potassium 2.3, total protein 6.2,  I have reviewed the images obtained: [place] wo contrast 01/04/2020: No acute intracranial abnormality. Right occipital encephalomalacia and remote craniotomy site.    ASSESSMENT/PLAN: 24 year old man status post resection of right parietal anaplastic oligodendroglioma, ADHD/psychosis/schizoaffective disorder who presented after intentional overdose.  Epilepsy with breakthrough seizure Intentional overdose Right parietal anaplastic oligodendroglioma status post resection ADHD/psychosis/descriptive disorder Cocaine use disorder Alcohol use disorder with alcohol intoxication Hypokalemia Thrombocytopenia -Patient presented with intentional overdose and was noted to have a seizure in the ED.  Etiology for seizures include medication noncompliance, alcohol use, cocaine use  Recommendations: -Patient was on 100 mg twice daily lamotrigine.  However has not been taking medication for at least 3 days since he has been inpatient and probably longer.  -Therefore will start at Lamotrigine 25 mg dose to be administered right now followed by 50 mg tonight and then resume 100 mg twice daily home dose starting tomorrow morning to avoid Katherina Right syndrome -If patient has any further seizures, can increase lamotrigine to  150 mg nightly, continue 100 mg every morning. -Continue seizure precautions, patient states he does not drive. -Wellbutrin and buspirone  can worsen seizures.  However this is usually a dose-related effect.  Therefore, goal is to avoid these medications in patients with this.  However, patient is clearly having auditory hallucinations and will require to be on these medications.  Hence, it is okay to resume these medications at minimal possible doses. -Also discussed effect of alcohol and cocaine use on seizures, alcohol cessation counseling.  Will defer further management of alcohol use disorder and cocaine use disorder to psychiatry -Follow-up with neurologist Dr. Mickeal Skinner in 8 to 12 weeks. -Management of rest of comorbidities per primary team  Thank you for allowing [place] to participate in the care of this patient.  Neurology will sign off.  Please call us with any further questions.   Zeb Comfort Epilepsy [facility]

## 2020-01-07 NOTE — Progress Notes (Addendum)
PROGRESS NOTE    Stephen Beard  HQI:[id number] DOB: [birth year] DOA: 01/03/2020 PCP: [facility]   Brief Narrative:  Stephen Beard is a 24 y.o. male with medical history significant of ADHD/psychosis/schizoaffective disorder and oligodendroglioma presenting with intentional overdose.  According to the chart, the patient took an uncertain number of pills, drank 1/2 of a fifth of vodka, and vomited about 30 minutes later.  He was able to provide history to Dr. Stark Jock and reported recent depression associated with medical problems as well as "his sexuality and relationships with friends."  He took Seroquel, Wellbutrin, and Buspar - all of which ere just prescribed on 10/20.  He had normal cognition according to Dr. Estanislado Pandy note.  At the time of my evaluation, the patient was quite stuporous with mostly unintelligible speech and he was clearly responding to voices and reacting to internal stimuli.  He exhibited periodic mildly threatening movements.  Between the EDP evaluation and mine, the patient had a witnessed seizure and was given Ativan. In ED patient reported he took Buspar, Wellbutrin, and "a bunch of other stuff".  Poison control recommends 24 obs.  Patient had a witnessed seizure, broke with Ativan. Neurology considering Wellbutrin but recommends loading with Keppra.  Patient now much more awake alert oriented, awaiting further evaluation with psych team, at this point patient is medically stable for discharge, no further episodes of seizure on current medications, appreciate insight and recommendations from psych for ongoing anxiolytics and treatment in the setting of intentional polypharmacy overdose.  Assessment & Plan:   Principal Problem:   Drug overdose, multiple drugs, intentional self-harm, initial encounter [facility]) Active Problems:   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HCC)   Focal seizures (HCC)   Schizoaffective disorder, depressive type  ([place])   Intentional overdose of drug in tablet form ([place])  Intentional polypharmacy overdose, POA Unclear if suicidal ideation Notable history of schizoaffective disorder -Patient reportedly overdosed on newly prescribed on 10/20 - Wellbutrin, Seroquel, and BuSpar -Witnessed seizure in the ER and post-ictal/stuporous state following, resolved -Tylenol negative, repeat remains negative -Salicylate negative -UDS positive for cocaine -Patient much more awake alert and interactive today, psychiatry reconsulted for further evaluation given intentional overdose, patient admits to taking the medications "so that I could nap and not have to wake up" but he declines specifically trying to "kill himself" per our discussion -Continue sitter, suicide precautions, seizure precautions  -Appreciate psychiatry consult -further medication management and help with possible disposition to inpatient psych per their expertise.  Acute provoked seizure in the setting of above versus given recent craniotomy as below   -Neurology sidelined to follow - we appreciate insight and recommendations -Likely acute provoked seizure secondary to above, increased risk given oligodendroglioma and recent surgery as below -previously on Lamictal -EEG without epileptiform discharge -Continue seizure precautions/Ativan as needed for breakthrough events; defer to neurology  Schizoaffective disorder -Psych consulted, appreciate insight recommendations  - Last seen in [facility] on 10/20; this was his initial psychiatric evaluation -He has depressed mood, difficulty concentrating, panic attacks, disturbed sleep, auditory hallucinations, and paranoia per family at bedside -He has h/o polysubstance abuse including methamphetamines, cocaine, alcohol, and benzos -His Seroquel was increased from 50 mg to 100 mg daily and he was started on Wellbutrin 150 and BuSpar 10 mg TID  -Hold all home medications for 24 to 48 hours, appreciate psychiatry  assistance with timing for resuming medications given unknown quantity of Seroquel, Wellbutrin, BuSpar was taken on the 23rd - IVC paperwork  completed by EDP  Oligodendroglioma -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 Mickeal Skinner, last seen on 9/23 and is due for appointment again this coming week   DVT prophylaxis:  Lovenox  Code Status:  Full  Family Communication: Mother at bedside  Status is: Inpatient  Dispo: The patient is from: Home              Anticipated d/c is to: To be determined              Anticipated d/c date is: 24-48h              Patient currently IS medically stable for discharge given improvement in symptoms and mental status.  Consultants:   Neurology, psychiatry  Procedures:   None planned  Antimicrobials:  None indicated  Subjective: No acute issues or events overnight, mother at bedside, lengthy discussion about patient's situation and likely discharge to inpatient psych pending further evaluation given intentional overdose.  He otherwise denies chest pain, shortness of breath, nausea, vomiting, diarrhea, constipation, headache, fevers, chills.  Objective: Vitals:   01/06/20 1715 01/06/20 1910 01/06/20 2304 01/07/20 0314  BP: 118/74 117/79 119/77 115/77  Pulse: 74 67 66 (!) 58  Resp: 18 18 18 18   Temp: 98.7 F (37.1 C) 98.3 F (36.8 C) 98.5 F (36.9 C) 98.1 F (36.7 C)  TempSrc: Oral Oral Oral Oral  SpO2: 98% 97% 99% 99%  Weight:      Height:        Intake/Output Summary (Last 24 hours) at 01/07/2020 0659 Last data filed at 01/06/2020 1700 Gross per 24 hour  Intake 240 ml  Output 650 ml  Net -410 ml   Filed Weights   01/03/20 2255  Weight: 63.5 kg      Examination:  General:  Pleasantly resting in bed, No acute distress.  A/O x3. HEENT:  Normocephalic atraumatic.  Sclerae nonicteric, noninjected.  Extraocular movements intact bilaterally. Neck:  Without mass or deformity.  Trachea is midline. Lungs:  Clear to auscultate bilaterally without rhonchi, wheeze, or rales. Heart:  Regular rate and rhythm.  Without murmurs, rubs, or gallops. Abdomen:  Soft, nontender, nondistended.  Without guarding or rebound. Extremities: Without cyanosis, clubbing, edema, or obvious deformity. Vascular:  Dorsalis pedis and posterior tibial pulses palpable bilaterally. Skin:  Warm and dry, no erythema, no ulcerations.  Data Reviewed: I have personally reviewed following labs and imaging studies  CBC: Recent Labs  Lab 01/03/20 2258 01/05/20 0145 01/06/20 0349 01/07/20 0409  WBC 3.2* 8.8 5.9 4.8  HGB 16.1 14.7 14.6 15.2  HCT 45.3 43.3 42.2 43.8  MCV 92.8 94.7 93.8 93.2  PLT 128* 154 132* 749*   Basic Metabolic Panel: Recent Labs  Lab 01/03/20 2258 01/04/20 0620 01/04/20 1644 01/05/20 0145 01/06/20 0349 01/07/20 0409  NA 139  --  148* 148* 138 140  K 3.2*  --  3.6 3.2* 3.4* 3.3*  CL 104  --  112* 114* 107 107  CO2 22  --  22 23 20* 25  GLUCOSE 107*  --  110* 91 92 99  BUN 10  --  8 7 5* 10  CREATININE 0.86  --  1.02 0.85 0.79 0.89  CALCIUM 8.6*  --  9.4 9.3 9.0 9.0  MG  --  2.3 2.2 2.1 2.0 2.1  PHOS  --   --   --   --  2.9 4.1   GFR: Estimated Creatinine Clearance: 114 mL/min (by C-G formula based on SCr of 0.89 mg/dL). Liver Function Tests: Recent Labs  Lab 01/03/20 2258 01/06/20 0349 01/07/20 0409  AST 24 39 27  ALT 32 30 26  ALKPHOS 73 66 70  BILITOT 0.5 1.5* 1.0  PROT 7.0 6.5 6.3*  ALBUMIN 4.4 3.9 3.9   No results for input(s): LIPASE, AMYLASE in the last 168 hours. No results for input(s): AMMONIA in the last 168 hours. Coagulation Profile: No results for input(s): INR, PROTIME in the last 168 hours. Cardiac  Enzymes: No results for input(s): CKTOTAL, CKMB, CKMBINDEX, TROPONINI in the last 168 hours. BNP (last 3 results) No results for input(s): PROBNP in the last 8760 hours. HbA1C: No results for input(s): HGBA1C in the last 72 hours. CBG: Recent Labs  Lab 01/03/20 2314  GLUCAP 105*   Lipid Profile: No results for input(s): CHOL, HDL, LDLCALC, TRIG, CHOLHDL, LDLDIRECT in the last 72 hours. Thyroid Function Tests: No results for input(s): TSH, T4TOTAL, FREET4, T3FREE, THYROIDAB in the last 72 hours. Anemia Panel: No results for input(s): VITAMINB12, FOLATE, FERRITIN, TIBC, IRON, RETICCTPCT in the last 72 hours. Sepsis Labs: No results for input(s): PROCALCITON, LATICACIDVEN in the last 168 hours.  Recent Results (from the past 240 hour(s))  Respiratory Panel by RT PCR (Flu A&B, Covid) - Nasopharyngeal Swab     Status: None   Collection Time: 01/04/20  5:50 AM   Specimen: Nasopharyngeal Swab  Result Value Ref Range Status   SARS Coronavirus 2 by RT PCR NEGATIVE NEGATIVE Final    Comment: (NOTE) SARS-CoV-2 target nucleic acids are NOT DETECTED.  The SARS-CoV-2 RNA is generally detectable in upper respiratoy specimens during the acute phase of infection. The lowest concentration of SARS-CoV-2 viral copies this assay can detect is 131 copies/mL. A negative result does not preclude SARS-Cov-2 infection and should not be used as the sole basis for treatment or other patient management decisions. A negative result may occur with  improper specimen collection/handling, submission of specimen other than nasopharyngeal swab, presence of viral mutation(s) within the areas targeted by this assay, and inadequate number of viral copies (<131 copies/mL). A negative result must be combined with clinical observations, patient history, and epidemiological information. The expected result is Negative.  Fact Sheet for Patients:  PinkCheek.be  Fact Sheet for  Healthcare Providers:  GravelBags.it  This test is no t yet approved or cleared by the [place] FDA and  has been authorized for detection and/or diagnosis of SARS-CoV-2 by FDA under an Emergency Use Authorization (EUA). This EUA will remain  in effect (meaning this test can be used) for the duration of the COVID-19 declaration under Section 564(b)(1) of the Act, 21 U.S.C. section 360bbb-3(b)(1), unless the authorization is terminated or revoked sooner.     Influenza A by PCR NEGATIVE NEGATIVE Final   Influenza B by PCR NEGATIVE NEGATIVE Final    Comment: (NOTE) The Xpert Xpress SARS-CoV-2/FLU/RSV assay is intended as an aid in  the diagnosis of influenza from Nasopharyngeal swab specimens and  should not be used as a sole basis for treatment. Nasal washings and  aspirates are unacceptable for Xpert Xpress SARS-CoV-2/FLU/RSV  testing.  Fact Sheet for Patients: PinkCheek.be  Fact Sheet for Healthcare Providers: GravelBags.it  This test is not yet approved or cleared by the [place]  FDA and  has been authorized for detection and/or diagnosis of SARS-CoV-2 by  FDA under an Emergency Use Authorization (EUA). This EUA will remain  in effect (meaning this test can be used) for the duration of the  Covid-19 declaration under Section 564(b)(1) of the Act, 21  U.S.C. section 360bbb-3(b)(1), unless the authorization is  terminated or revoked. 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 EEG to evaluate for seizure Level of alertness: Awake, asleep AEDs during EEG study: None Technical aspects: This EEG study was done with scalp electrodes positioned according to the 10-20 International system of electrode placement. Electrical activity was acquired at a sampling rate of 500Hz  and reviewed with a high frequency filter of 70Hz  and a low frequency filter of 1Hz . EEG data were recorded continuously and digitally stored. Description: No  posterior dominant rhythm was seen. Sleep was characterized by vertex waves, sleep spindles (12 to 14 Hz), maximal frontocentral region.  There is an excessive amount of 15 to 18 Hz beta activity distributed symmetrically and diffusely.   Hyperventilation and photic stimulation were not performed.   ABNORMALITY -Excessive beta, generalized IMPRESSION: This study is within normal limits. No seizures or epileptiform discharges were seen throughout the recording.The excessive beta activity seen in the background is most likely due to the effect of benzodiazepine and is a benign EEG pattern. Priyanka [name]    Scheduled Meds: . docusate sodium  100 mg Oral BID  . enoxaparin (LOVENOX) injection  40 mg Subcutaneous Q24H  . QUEtiapine  50 mg Oral QHS   Continuous Infusions: . sodium chloride Stopped (01/06/20 1936)    LOS: 2 days   Time spent: 75min  Malyia Moro C Kymora Sciara, DO [facility]  If 7PM-7AM, please contact night-coverage [email or web address]  01/07/2020, 6:59 AM

## 2020-01-07 NOTE — Progress Notes (Addendum)
Pt accepted to [facility], bed 500-1    Vivien Rossetti, NP is the accepting provider.    Dr. Mallie Darting is the attending provider.    Call report to [place] [facility] med floor notified.     Pt is under IVC and will be transported by law enforcement  Pt is scheduled to arrive at [facility] at 8pm, pending negative Covid test result.    Audree Camel, MSW, LCSW, [place] Clinical Social Worker II Disposition CSW [phone]

## 2020-01-07 NOTE — Discharge Summary (Signed)
Physician Discharge Summary  Stephen Beard IOX:[id number] DOB: [birth year] DOA: 01/03/2020  PCP: [facility] date: 01/03/2020 Discharge date: 01/07/2020  Admitted From: Home Disposition: Inpatient psych  Recommendations for Outpatient Follow-up:  1. Follow up with PCP in 1-2 weeks 2. Psychiatry as scheduled  Home Health: None Equipment/Devices: None  Discharge Condition: Stable CODE STATUS: Full Diet recommendation: Low-salt low-fat diet  Brief/Interim Summary: Stephen L Bridgersis a 24 y.o.malewith medical history significant ofADHD/psychosis/schizoaffective disorder and oligodendrogliomapresenting with intentional overdose. According to the chart, the patient took an uncertain number of pills, drank 1/2 of a fifth of vodka, and vomited about 30 minutes later. He was able to provide history to Stephen Beard and reported recent depression associated with medical problems as well as "his sexuality and relationships with friends." He took Seroquel, Wellbutrin, and Buspar - all of which ere just prescribed on 10/20. He had normal cognition according to Stephen Beard note. At the time of my evaluation, the patient was quite stuporous with mostly unintelligible speech and he was clearly responding to voices and reacting to internal stimuli. He exhibited periodic mildly threatening movements. Between the Stephen Beard evaluation and mine, the patient had a witnessed seizure and was given Ativan. In ED patient reported he took Buspar, Wellbutrin, and "a bunch of other stuff". Poison control recommends 24 obs. Patient had a witnessed seizure, broke with Ativan. Neurology considering Wellbutrin but recommends loading with Keppra.  Patient now much more awake alert oriented, awaiting further evaluation with psych team, at this point patient is medically stable for discharge, no further episodes of seizure on current medications, appreciate insight and  recommendations from psych for ongoing anxiolytics and treatment in the setting of intentional polypharmacy overdose. Patient will discharge on tapering dose of Lamictal per discussion with neurology given seizure risk.  Initiate 25 mg tablet now 50 mg tablet tonight and 100 twice daily starting tomorrow.  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   busPIRone 10 MG tablet Commonly known as: BUSPAR Take 1 tablet (10 mg total) by mouth daily.   lamoTRIgine 25 MG tablet Commonly known as: LAMICTAL Take 2 tablets (50 mg total) by mouth at bedtime.   lamoTRIgine 100 MG tablet Commonly known as: LAMICTAL Take 1 tablet (100 mg total) by mouth 2 (two) times daily. Start taking on: January 08, 2020   QUEtiapine 100 MG tablet Commonly known as: SEROquel Take 1 tablet (100 mg total) by mouth at bedtime. Start dose at 50 mg at bedtime for the first week. If tolerated well without any side effects, increase dosage by 50 mg each week. Max dose to not exceed 200 mg   temozolomide 100 MG capsule Commonly known as: TEMODAR Take 100 mg by mouth daily. May take on an  empty stomach or at bedtime to decrease nausea & vomiting.       No Known Allergies  Consultations:  Neurology, psychiatry   Procedures/Studies: CT Head Wo  Contrast  Result Date: 01/04/2020 CLINICAL DATA:  Seizure and encephalopathy. EXAM: CT HEAD WITHOUT CONTRAST TECHNIQUE: Contiguous axial images were obtained from the base of the skull through the vertex without intravenous contrast. COMPARISON:  07/03/2019 FINDINGS: Brain: Right occipital encephalomalacia. No hemorrhage or extra-axial collection. Ex vacuo dilatation of the right lateral ventricle. Vascular: No abnormal hyperdensity of the major intracranial arteries or dural venous sinuses. No intracranial atherosclerosis. Skull: Right posterior remote craniotomy site. Sinuses/Orbits: No fluid levels or advanced mucosal thickening of the visualized paranasal sinuses. No mastoid or middle ear effusion. The orbits are normal. IMPRESSION: 1. No acute intracranial abnormality. 2. Right occipital encephalomalacia and remote craniotomy site. Electronically Signed   By: Stephen Jarred M.D.   On: 01/04/2020 05:51   MR BRAIN W WO CONTRAST  Result Date: 12/13/2019 CLINICAL DATA:  Frontal headaches. History of brain tumor status post resection. EXAM: MRI HEAD WITHOUT AND WITH CONTRAST TECHNIQUE: Multiplanar, multiecho pulse sequences of the brain and surrounding structures were obtained without and with intravenous contrast. CONTRAST:  41mL GADAVIST GADOBUTROL 1 MMOL/ML IV SOLN COMPARISON:  10/11/2019 FINDINGS: Brain: Unchanged appearance posteromedial right hemisphere oligodendroglioma resection cavity. The area hyperintense T2-weighted signal adjacent to the resection site is unchanged. No acute ischemia. No acute hemorrhage. There is ex vacuo dilatation of the temporal and occipital horns of the right lateral ventricle. Unchanged distribution of old blood products. Previously seen small focus of contrast enhancement adjacent to the atrium of the right lateral ventricle has decreased in size. Vascular: Normal flow voids. Skull and upper cervical spine: Remote right posterior craniotomy. Sinuses/Orbits: Negative. Other:  None. IMPRESSION: Decreased size of small focus of contrast enhancement adjacent to the atrium of the right lateral ventricle, favored to be postoperative. Electronically Signed   By: Stephen Jarred M.D.   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 EEG to evaluate for seizure Level of alertness: Awake, asleep AEDs during EEG study: None Technical aspects: This EEG study was done with scalp electrodes positioned according to the 10-20 International system of electrode placement. Electrical activity was acquired at a sampling rate of 500Hz  and reviewed with a high frequency filter of 70Hz  and a low frequency filter of 1Hz . EEG data were recorded continuously and digitally stored. Description: No  posterior dominant rhythm was seen. Sleep was characterized by vertex waves, sleep spindles (12 to 14 Hz), maximal frontocentral region.  There is an excessive amount of 15 to 18 Hz beta activity distributed symmetrically and diffusely.   Hyperventilation and photic stimulation were not performed.   ABNORMALITY -Excessive beta, generalized IMPRESSION: This study is within normal limits. No seizures or epileptiform discharges were seen throughout the recording.The excessive beta activity seen in the background is most likely due to the effect of benzodiazepine and is a benign EEG pattern. Priyanka [name]      Subjective: No acute issues or events overnight, feels quite well denies nausea, vomiting, diarrhea, constipation, headache, fevers, chills.   Discharge Exam: Vitals:  01/07/20 0314 01/07/20 0842  BP: 115/77 118/78  Pulse:  (!) 58 85  Resp: 18 14  Temp: 98.1 F (36.7 C) 97.7 F (36.5 C)  SpO2: 99% 100%   Vitals:   01/06/20 1910 01/06/20 2304 01/07/20 0314 01/07/20 0842  BP: 117/79 119/77 115/77 118/78  Pulse: 67 66 (!) 58 85  Resp: 18 18 18 14   Temp: 98.3 F (36.8 C) 98.5 F (36.9 C) 98.1 F (36.7 C) 97.7 F (36.5 C)  TempSrc: Oral Oral Oral Oral  SpO2: 97% 99% 99% 100%  Weight:      Height:        General: Pt is alert, awake, not in acute distress Cardiovascular: RRR, S1/S2 +, no rubs, no gallops Respiratory: CTA bilaterally, no wheezing, no rhonchi Abdominal: Soft, NT, ND, bowel sounds + Extremities: no edema, no cyanosis    The results of significant diagnostics from this hospitalization (including imaging, microbiology, ancillary and laboratory) are listed below for reference.     Microbiology: Recent Results (from the past 240 hour(s))  Respiratory Panel by RT PCR (Flu A&B, Covid) - Nasopharyngeal Swab     Status: None   Collection Time: 01/04/20  5:50 AM   Specimen: Nasopharyngeal Swab  Result Value Ref Range Status   SARS Coronavirus 2 by RT PCR NEGATIVE NEGATIVE Final    Comment: (NOTE) SARS-CoV-2 target nucleic acids are NOT DETECTED.  The SARS-CoV-2 RNA is generally detectable in upper respiratoy specimens during the acute phase of infection. The lowest concentration of SARS-CoV-2 viral copies this assay can detect is 131 copies/mL. A negative result does not preclude SARS-Cov-2 infection and should not be used as the sole basis for treatment or other patient management decisions. A negative result may occur with  improper specimen collection/handling, submission of specimen other than nasopharyngeal swab, presence of viral mutation(s) within the areas targeted by this assay, and inadequate number of viral copies (<131 copies/mL). A negative result must be combined with clinical observations, patient history, and epidemiological information. The expected result is  Negative.  Fact Sheet for Patients:  PinkCheek.be  Fact Sheet for Healthcare Providers:  GravelBags.it  This test is no t yet approved or cleared by the [place] FDA and  has been authorized for detection and/or diagnosis of SARS-CoV-2 by FDA under an Emergency Use Authorization (EUA). This EUA will remain  in effect (meaning this test can be used) for the duration of the COVID-19 declaration under Section 564(b)(1) of the Act, 21 U.S.C. section 360bbb-3(b)(1), unless the authorization is terminated or revoked sooner.     Influenza A by PCR NEGATIVE NEGATIVE Final   Influenza B by PCR NEGATIVE NEGATIVE Final    Comment: (NOTE) The Xpert Xpress SARS-CoV-2/FLU/RSV assay is intended as an aid in  the diagnosis of influenza from Nasopharyngeal swab specimens and  should not be used as a sole basis for treatment. Nasal washings and  aspirates are unacceptable for Xpert Xpress SARS-CoV-2/FLU/RSV  testing.  Fact Sheet for Patients: PinkCheek.be  Fact Sheet for Healthcare Providers: GravelBags.it  This test is not yet approved or cleared by the [place] FDA and  has been authorized for detection and/or diagnosis of SARS-CoV-2 by  FDA under an Emergency Use Authorization (EUA). This EUA will remain  in effect (meaning this test can be used) for the duration of the  Covid-19 declaration under Section 564(b)(1) of the Act, 21  U.S.C. section 360bbb-3(b)(1), unless the authorization is  terminated or revoked. Performed at [facility],  [address], [place], [place] [address]      Labs: BNP (last 3 results) No results for input(s): BNP in the last 8760 hours. Basic Metabolic Panel: Recent Labs  Lab 01/03/20 2258 01/04/20 0620 01/04/20 1644 01/05/20 0145 01/06/20 0349 01/07/20 0409  NA 139  --  148* 148* 138 140  K 3.2*  --  3.6 3.2* 3.4* 3.3*   CL 104  --  112* 114* 107 107  CO2 22  --  22 23 20* 25  GLUCOSE 107*  --  110* 91 92 99  BUN 10  --  8 7 5* 10  CREATININE 0.86  --  1.02 0.85 0.79 0.89  CALCIUM 8.6*  --  9.4 9.3 9.0 9.0  MG  --  2.3 2.2 2.1 2.0 2.1  PHOS  --   --   --   --  2.9 4.1   Liver Function Tests: Recent Labs  Lab 01/03/20 2258 01/06/20 0349 01/07/20 0409  AST 24 39 27  ALT 32 30 26  ALKPHOS 73 66 70  BILITOT 0.5 1.5* 1.0  PROT 7.0 6.5 6.3*  ALBUMIN 4.4 3.9 3.9   No results for input(s): LIPASE, AMYLASE in the last 168 hours. No results for input(s): AMMONIA in the last 168 hours. CBC: Recent Labs  Lab 01/03/20 2258 01/05/20 0145 01/06/20 0349 01/07/20 0409  WBC 3.2* 8.8 5.9 4.8  HGB 16.1 14.7 14.6 15.2  HCT 45.3 43.3 42.2 43.8  MCV 92.8 94.7 93.8 93.2  PLT 128* 154 132* 144*   Cardiac Enzymes: No results for input(s): CKTOTAL, CKMB, CKMBINDEX, TROPONINI in the last 168 hours. BNP: Invalid input(s): POCBNP CBG: Recent Labs  Lab 01/03/20 2314  GLUCAP 105*   D-Dimer No results for input(s): DDIMER in the last 72 hours. Hgb A1c No results for input(s): HGBA1C in the last 72 hours. Lipid Profile No results for input(s): CHOL, HDL, LDLCALC, TRIG, CHOLHDL, LDLDIRECT in the last 72 hours. Thyroid function studies No results for input(s): TSH, T4TOTAL, T3FREE, THYROIDAB in the last 72 hours.  Invalid input(s): FREET3 Anemia work up No results for input(s): VITAMINB12, FOLATE, FERRITIN, TIBC, IRON, RETICCTPCT in the last 72 hours. Urinalysis    Component Value Date/Time   COLORURINE YELLOW 05/07/2014 1307   APPEARANCEUR CLEAR 05/07/2014 1307   LABSPEC 1.018 05/07/2014 1307   PHURINE 6.5 05/07/2014 1307   GLUCOSEU NEGATIVE 05/07/2014 1307   HGBUR NEGATIVE 05/07/2014 1307   [place] 05/07/2014 1307   KETONESUR NEGATIVE 05/07/2014 1307   PROTEINUR NEGATIVE 05/07/2014 1307   UROBILINOGEN 1.0 05/07/2014 1307   NITRITE NEGATIVE 05/07/2014 1307   LEUKOCYTESUR NEGATIVE  05/07/2014 1307   Sepsis Labs Invalid input(s): PROCALCITONIN,  WBC,  LACTICIDVEN Microbiology Recent Results (from the past 240 hour(s))  Respiratory Panel by RT PCR (Flu A&B, Covid) - Nasopharyngeal Swab     Status: None   Collection Time: 01/04/20  5:50 AM   Specimen: Nasopharyngeal Swab  Result Value Ref Range Status   SARS Coronavirus 2 by RT PCR NEGATIVE NEGATIVE Final    Comment: (NOTE) SARS-CoV-2 target nucleic acids are NOT DETECTED.  The SARS-CoV-2 RNA is generally detectable in upper respiratoy specimens during the acute phase of infection. The lowest concentration of SARS-CoV-2 viral copies this assay can detect is 131 copies/mL. A negative result does not preclude SARS-Cov-2 infection and should not be used as the sole basis for treatment or other patient management decisions. A negative result may occur with  improper specimen collection/handling, submission  of specimen other than nasopharyngeal swab, presence of viral mutation(s) within the areas targeted by this assay, and inadequate number of viral copies (<131 copies/mL). A negative result must be combined with clinical observations, patient history, and epidemiological information. The expected result is Negative.  Fact Sheet for Patients:  PinkCheek.be  Fact Sheet for Healthcare Providers:  GravelBags.it  This test is no t yet approved or cleared by the [place] FDA and  has been authorized for detection and/or diagnosis of SARS-CoV-2 by FDA under an Emergency Use Authorization (EUA). This EUA will remain  in effect (meaning this test can be used) for the duration of the COVID-19 declaration under Section 564(b)(1) of the Act, 21 U.S.C. section 360bbb-3(b)(1), unless the authorization is terminated or revoked sooner.     Influenza A by PCR NEGATIVE NEGATIVE Final   Influenza B by PCR NEGATIVE NEGATIVE Final    Comment: (NOTE) The Xpert  Xpress SARS-CoV-2/FLU/RSV assay is intended as an aid in  the diagnosis of influenza from Nasopharyngeal swab specimens and  should not be used as a sole basis for treatment. Nasal washings and  aspirates are unacceptable for Xpert Xpress SARS-CoV-2/FLU/RSV  testing.  Fact Sheet for Patients: PinkCheek.be  Fact Sheet for Healthcare Providers: GravelBags.it  This test is not yet approved or cleared by the [place] FDA and  has been authorized for detection and/or diagnosis of SARS-CoV-2 by  FDA under an Emergency Use Authorization (EUA). This EUA will remain  in effect (meaning this test can be used) for the duration of the  Covid-19 declaration under Section 564(b)(1) of the Act, 21  U.S.C. section 360bbb-3(b)(1), unless the authorization is  terminated or revoked. Performed at [facility], [place] [address], [place], [place] [address]      Time coordinating discharge: Over 30 minutes  SIGNED:   Little Ishikawa, DO [facility] 01/07/2020, 1:49 PM Pager   If 7PM-7AM, please contact night-coverage [email or web address]

## 2020-01-07 NOTE — Progress Notes (Signed)
Transfer report given to [facility] in [facility]

## 2020-01-07 NOTE — Consult Note (Signed)
[facility] Face-to-Face Psychiatry Consult   Reason for Consult: Intentional overdose Referring Physician: Dr. Avon Gully Patient Identification: Stephen Beard MRN:  [id number] Principal Diagnosis: Drug overdose, multiple drugs, intentional self-harm, initial encounter [facility]) Diagnosis:  Principal Problem:   Drug overdose, multiple drugs, intentional self-harm, initial encounter 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 male patient admitted with suicide attempt by intentional overdose.  Per patient" I was just fed up and tired.  From what I can remember to all in any of my medications that I had available.  I have been hearing this lady for sent up to sundown, she is constantly in my head.  She states she works for the [employer]	[place] investigation and usually stays on going to jail."  I have been paranoid thinking someone is out to get me, and that police will be coming to pick me up at any time."  Patient reports feeling depressed and overwhelmed, secondary to auditory hallucinations.  He reports seeing a psychiatric mental health provider on October 20, 2 days prior to his significant overdose.  He was seen at [facility] center for medication management, after referral from his neurologist for auditory hallucinations.  He denies any suicide attempt as a command hallucinations.   HPI:  Stephen Hochmuth Bridgersis a 58 y.o.malewith medical history significant ofADHD/psychosis/schizoaffective disorder and oligodendrogliomapresenting with intentional overdose. According to the chart, the patient took an uncertain number of pills, drank 1/2 of a fifth of vodka, and vomited about 30 minutes later. He was able to provide history to Dr. Stark Jock and  reported recent depression associated with medical problems as well as "his sexuality and relationships with friends." He took Seroquel, Wellbutrin, and Buspar - all of which ere just prescribed on 10/20. He had normal cognition according to Dr. Estanislado Pandy note. At the time of my evaluation, the patient was quite stuporous with mostly unintelligible speech and he was clearly responding to voices and reacting to internal stimuli. He exhibited periodic mildly threatening movements. Between the EDP evaluation and mine, the patient had a witnessed seizure and was given Ativan. In ED patient reported he took Buspar, Wellbutrin, and "a bunch of other stuff". Poison control recommends 24 obs. Patient had a witnessed seizure, broke with Ativan. Neurology considering Wellbutrin but recommends loading with Keppra.   During today's evaluation he is fairly groomed, observed to be lying in a hospital bed.  He is cooperative, pleasant, and actively participates in conversation with this Probation officer.  His mother is at the bedside, consent is obtained to speak with mother in his presence.  Patient continues to endorse having increased depression, fatigue, worthlessness, difficulty concentrating in, and feeling overwhelmed.  Since his admission he currently rates his depression a 5 out of 10 with 10 being the worst.  He is currently on a antipsychotic Seroquel 50 mg, however continues to endorse ongoing auditory hallucinations.  He denies any previous psychiatric history, although chart review indicates previous history of psychosis and ADHD.  When assessing his insight and current suicidality he states " Im sad it did not work but will I do it again no I hated did not work the first time.  But it is not something I would do again ." he denies any current  substance use, urine drug screen positive for cocaine.  He denies any current suicidal ideations, and or homicidal ideations.  Past Psychiatric History: Patient reports recent  psychiatric evaluation performed at behavioral [facility] center [facility].  During his visit he was started on Seroquel 50 mg, Wellbutrin XL 150, and buspirone 10 mg p.o. 3 times daily for anxiety.  He denies any previous suicide attempts.  He reports a history of self-harm to include banging of his head when he was younger.  He currently denies any substance use and or legal charges.  Risk to Self:  Yes Risk to Others:  Denies Prior Inpatient Therapy:  Denies Prior Outpatient Therapy:  See above recent evaluation on October 20  Past Medical History:  Past Medical History:  Diagnosis Date  . ADHD     Past Surgical History:  Procedure Laterality Date  . APPLICATION OF CRANIAL NAVIGATION N/A 07/02/2019   Procedure: APPLICATION OF CRANIAL NAVIGATION;  Surgeon: Judith Part, MD;  Location: [place];  Service: Neurosurgery;  Laterality: N/A;  . CRANIOTOMY N/A 07/03/2019   Procedure: CRANIOTOMY HEMATOMA EVACUATION SUBDURAL;  Surgeon: Judith Part, MD;  Location: [place];  Service: Neurosurgery;  Laterality: N/A;  . CRANIOTOMY Right 07/03/2019   Procedure: CRANIOTOMY HEMATOMA EVACUATION SUBDURAL;  Surgeon: Judith Part, MD;  Location: [place];  Service: Neurosurgery;  Laterality: Right;  . CRANIOTOMY Right 07/02/2019   Procedure: CRANIOTOMY TUMOR EXCISION with Lucky Rathke;  Surgeon: Judith Part, MD;  Location: [place];  Service: Neurosurgery;  Laterality: Right;  posterior   Family History: History reviewed. No pertinent family history. Family Psychiatric  History: Per mother maternal grandmother attempted suicide " she was tired.  Just tired."  Attempted suicide in her late 23s.  Mother states her mental illness was undiagnosed.  Social History:  Social History   Substance and Sexual Activity  Alcohol Use Yes     Social History   Substance and Sexual Activity  Drug Use No    Social History   Socioeconomic History  . Marital status: Single    Spouse name: Not on  file  . Number of children: Not on file  . Years of education: Not on file  . Highest education level: Not on file  Occupational History  . Not on file  Tobacco Use  . Smoking status: Current Every Day Smoker  . Smokeless tobacco: Current User    Types: Chew  Substance and Sexual Activity  . Alcohol use: Yes  . Drug use: No  . Sexual activity: Not on file  Other Topics Concern  . Not on file  Social History Narrative  . Not on file   Social Determinants of Health   Financial Resource Strain:   . Difficulty of Paying Living Expenses: Not on file  Food Insecurity:   . Worried About Charity fundraiser in the Last Year: Not on file  . Ran Out of Food in the Last Year: Not on file  Transportation Needs:   . Lack of Transportation (Medical): Not on file  . Lack of Transportation (Non-Medical): Not on file  Physical Activity:   . Days of Exercise per Week: Not on file  . Minutes of Exercise per Session: Not on file  Stress:   . Feeling of Stress : Not on file  Social Connections:   . Frequency of Communication with Friends and Family: Not on file  . Frequency of Social Gatherings with Friends and Family: Not on file  .  Attends Religious Services: Not on file  . Active Member of Clubs or Organizations: Not on file  . Attends Archivist Meetings: Not on file  . Marital Status: Not on file   Additional Social History:    Allergies:  No Known Allergies  Labs:  Results for orders placed or performed during the hospital encounter of 01/03/20 (from the past 48 hour(s))  Comprehensive metabolic panel     Status: Abnormal   Collection Time: 01/06/20  3:49 AM  Result Value Ref Range   Sodium 138 135 - 145 mmol/L   Potassium 3.4 (L) 3.5 - 5.1 mmol/L   Chloride 107 98 - 111 mmol/L   CO2 20 (L) 22 - 32 mmol/L   Glucose, Bld 92 70 - 99 mg/dL    Comment: Glucose reference range applies only to samples taken after fasting for at least 8 hours.   BUN 5 (L) 6 - 20 mg/dL    Creatinine, Ser 0.79 0.61 - 1.24 mg/dL   Calcium 9.0 8.9 - 10.3 mg/dL   Total Protein 6.5 6.5 - 8.1 g/dL   Albumin 3.9 3.5 - 5.0 g/dL   AST 39 15 - 41 U/L   ALT 30 0 - 44 U/L   Alkaline Phosphatase 66 38 - 126 U/L   Total Bilirubin 1.5 (H) 0.3 - 1.2 mg/dL   GFR, Estimated >60 >60 mL/min    Comment: (NOTE) Calculated using the CKD-EPI Creatinine Equation (2021)    Anion gap 11 5 - 15    Comment: Performed at [facility] [address], [place] [address]  CBC     Status: Abnormal   Collection Time: 01/06/20  3:49 AM  Result Value Ref Range   WBC 5.9 4.0 - 10.5 K/uL   RBC 4.50 4.22 - 5.81 MIL/uL   Hemoglobin 14.6 13.0 - 17.0 g/dL   HCT 42.2 39 - 52 %   MCV 93.8 80.0 - 100.0 fL   MCH 32.4 26.0 - 34.0 pg   MCHC 34.6 30.0 - 36.0 g/dL   RDW 12.5 11.5 - 15.5 %   Platelets 132 (L) 150 - 400 K/uL   nRBC 0.0 0.0 - 0.2 %    Comment: Performed at [facility], [place] [address] [place], [place] [address]  Magnesium     Status: None   Collection Time: 01/06/20  3:49 AM  Result Value Ref Range   Magnesium 2.0 1.7 - 2.4 mg/dL    Comment: Performed at [facility] [address] [place], [place] [address]  Phosphorus     Status: None   Collection Time: 01/06/20  3:49 AM  Result Value Ref Range   Phosphorus 2.9 2.5 - 4.6 mg/dL    Comment: Performed at [facility] [address] [place],  [address]  Comprehensive metabolic panel     Status: Abnormal   Collection Time: 01/07/20  4:09 AM  Result Value Ref Range   Sodium 140 135 - 145 mmol/L   Potassium 3.3 (L) 3.5 - 5.1 mmol/L   Chloride 107 98 - 111 mmol/L   CO2 25 22 - 32 mmol/L   Glucose, Bld 99 70 - 99 mg/dL    Comment: Glucose reference range applies only to samples taken after fasting for at least 8 hours.   BUN 10 6 - 20 mg/dL   Creatinine, Ser 0.89 0.61 - 1.24 mg/dL   Calcium 9.0 8.9 - 10.3 mg/dL   Total Protein  6.3 (L) 6.5 - 8.1 g/dL   Albumin 3.9 3.5 - 5.0 g/dL   AST 27 15 - 41 U/L    ALT 26 0 - 44 U/L   Alkaline Phosphatase 70 38 - 126 U/L   Total Bilirubin 1.0 0.3 - 1.2 mg/dL   GFR, Estimated >60 >60 mL/min    Comment: (NOTE) Calculated using the CKD-EPI Creatinine Equation (2021)    Anion gap 8 5 - 15    Comment: Performed at [facility] [address], [place] [address]  CBC     Status: Abnormal   Collection Time: 01/07/20  4:09 AM  Result Value Ref Range   WBC 4.8 4.0 - 10.5 K/uL   RBC 4.70 4.22 - 5.81 MIL/uL   Hemoglobin 15.2 13.0 - 17.0 g/dL   HCT 43.8 39 - 52 %   MCV 93.2 80.0 - 100.0 fL   MCH 32.3 26.0 - 34.0 pg   MCHC 34.7 30.0 - 36.0 g/dL   RDW 12.2 11.5 - 15.5 %   Platelets 144 (L) 150 - 400 K/uL   nRBC 0.0 0.0 - 0.2 %    Comment: Performed at [facility], [place] [address], [place], [place] [address]  Magnesium     Status: None   Collection Time: 01/07/20  4:09 AM  Result Value Ref Range   Magnesium 2.1 1.7 - 2.4 mg/dL    Comment: Performed at [facility] [address] [place], [place] [address]  Phosphorus     Status: None   Collection Time: 01/07/20  4:09 AM  Result Value Ref Range   Phosphorus 4.1 2.5 - 4.6 mg/dL    Comment: Performed at [facility] [address], [place], [place] [address]    Current Facility-Administered Medications  Medication Dose Route Frequency Provider Last Rate Last Admin  . 0.9 %  sodium chloride infusion   Intravenous Continuous Karmen Bongo, MD   Stopped at 01/06/20 1936  . acetaminophen (TYLENOL) tablet 650 mg  650 mg Oral Q4H PRN Karmen Bongo, MD       Or  . acetaminophen (TYLENOL) suppository 650 mg  650 mg Rectal Q4H PRN Karmen Bongo, MD      . docusate sodium (COLACE) capsule 100 mg  100 mg Oral BID Karmen Bongo, MD   100 mg at 01/07/20 1143  . enoxaparin (LOVENOX) injection 40 mg  40 mg Subcutaneous Q24H Karmen Bongo, MD   40 mg at 01/07/20 1143  . LORazepam (ATIVAN) injection 1 mg  1 mg Intravenous Q2H PRN Little Ishikawa, MD   1 mg at  01/06/20 1442  . LORazepam (ATIVAN) injection 1-2 mg  1-2 mg Intravenous Q2H PRN Karmen Bongo, MD   2 mg at 01/06/20 2108  . nicotine (NICODERM CQ - dosed in mg/24 hours) patch 21 mg  21 mg Transdermal Daily Starkes-Perry, Gayland Curry, FNP      . ondansetron (ZOFRAN) tablet 4 mg  4 mg Oral Q6H PRN Karmen Bongo, MD       Or  . ondansetron [facility]) injection 4 mg  4 mg Intravenous Q6H PRN Karmen Bongo, MD      . polyethylene glycol (MIRALAX / GLYCOLAX) packet 17 g  17 g Oral Daily PRN Karmen Bongo, MD      . QUEtiapine (SEROQUEL) tablet 50 mg  50 mg Oral Ivery Quale, MD   50 mg at 01/06/20 2109  . sodium bicarbonate injection 50 mEq  50 mEq Intravenous Q4H PRN Karmen Bongo, MD   50 mEq at 01/04/20 1041    Musculoskeletal: Strength & Muscle Tone: within normal limits Gait & Station: normal Patient leans: N/A  Psychiatric Specialty Exam: Physical Exam  Review of Systems  Blood pressure 118/78, pulse 85, temperature 97.7 F (36.5 C), temperature source Oral, resp. rate 14, height 5\' 7"  (1.702 m), weight 63.5 kg, SpO2 100 %.Body mass index is 21.93 kg/m.  General Appearance: Fairly Groomed  Eye Contact:  Fair  Speech:  Clear and Coherent and Normal Rate  Volume:  Normal  Mood:  Depressed, Dysphoric and Worthless  Affect:  Congruent, Constricted and Depressed  Thought Process:  Coherent, Linear and Descriptions of Associations: Intact  Orientation:  Full (Time, Place, and Person)  Thought Content:  Logical and Hallucinations: Auditory  Suicidal Thoughts:  Attempted,denies at this time.   Homicidal Thoughts:  No  Memory:  Immediate;   Fair Recent;   Fair  Judgement:  Poor  Insight:  Shallow  Psychomotor Activity:  Normal  Concentration:  Concentration: Fair and Attention Span: Fair  Recall:  [employer] of Knowledge:  Fair  Language:  Fair  Akathisia:  No  Handed:  Right  AIMS (if indicated):     Assets:  Communication Skills Desire for Improvement Financial  Resources/Insurance Physical Health Resilience Social Support Transportation Vocational/Educational  ADL's:  Intact  Cognition:  WNL  Sleep:        Treatment Plan Summary: Plan Recommend inpatient to psychiatric facility.  Will increase Seroquel from 50 mg to 100 mg.  Will place orders for transfer.  Disposition: Recommend psychiatric Inpatient admission when medically cleared. [facility]  Patient continues to meet inpatient criteria secondary to intentional polypharmacy overdose.`  Suella Broad, FNP 01/07/2020 12:22 PM

## 2020-01-07 NOTE — Progress Notes (Addendum)
Patient discharging to [facility] today. RN to call report.   Transport will need to be called for around 7:00 PM, if COVID has resulted negative. Call [phone] (press 1 and then press 3 to get dispatch) and ask for patient under IVC to be transported to [facility].   No other needs identified at this time.  Laveda Abbe, [place] Clinical Social Worker [phone]

## 2020-01-08 ENCOUNTER — Other Ambulatory Visit: Payer: Self-pay

## 2020-01-08 ENCOUNTER — Encounter (HOSPITAL_COMMUNITY): Payer: Self-pay | Admitting: Family

## 2020-01-08 ENCOUNTER — Telehealth: Payer: Self-pay | Admitting: *Deleted

## 2020-01-08 DIAGNOSIS — F29 Unspecified psychosis not due to a substance or known physiological condition: Secondary | ICD-10-CM | POA: Diagnosis not present

## 2020-01-08 LAB — TSH: TSH: 1.41 u[IU]/mL (ref 0.350–4.500)

## 2020-01-08 MED ORDER — SERTRALINE HCL 25 MG PO TABS
25.0000 mg | ORAL_TABLET | Freq: Every day | ORAL | Status: DC
  Administered 2020-01-08 – 2020-01-09 (×2): 25 mg via ORAL
  Filled 2020-01-08 (×3): qty 1

## 2020-01-08 MED ORDER — LAMOTRIGINE 200 MG PO TABS
200.0000 mg | ORAL_TABLET | Freq: Every day | ORAL | Status: DC
  Administered 2020-01-08 – 2020-01-12 (×5): 200 mg via ORAL
  Filled 2020-01-08 (×4): qty 1
  Filled 2020-01-08: qty 2
  Filled 2020-01-08 (×3): qty 1

## 2020-01-08 MED ORDER — OLANZAPINE 2.5 MG PO TABS
2.5000 mg | ORAL_TABLET | Freq: Every day | ORAL | Status: DC
  Administered 2020-01-08: 2.5 mg via ORAL
  Filled 2020-01-08 (×3): qty 1

## 2020-01-08 MED ORDER — ONDANSETRON HCL 4 MG PO TABS: 8.0000 mg | ORAL_TABLET | Freq: Three times a day (TID) | ORAL | Status: DC | PRN

## 2020-01-08 MED ORDER — OLANZAPINE 5 MG PO TABS
5.0000 mg | ORAL_TABLET | Freq: Every day | ORAL | Status: DC
  Administered 2020-01-08 – 2020-01-11 (×4): 5 mg via ORAL
  Filled 2020-01-08 (×3): qty 1
  Filled 2020-01-08: qty 2
  Filled 2020-01-08 (×4): qty 1

## 2020-01-08 MED ORDER — TEMOZOLOMIDE 100 MG PO CAPS: 100.0000 mg | ORAL_CAPSULE | Freq: Every day | ORAL | Status: DC

## 2020-01-08 MED ORDER — BOOST / RESOURCE BREEZE PO LIQD CUSTOM
1.0000 | Freq: Two times a day (BID) | ORAL | Status: DC
  Administered 2020-01-08 – 2020-01-09 (×2): 1 via ORAL
  Filled 2020-01-08 (×13): qty 1

## 2020-01-08 MED ORDER — LAMOTRIGINE 100 MG PO TABS
250.0000 mg | ORAL_TABLET | Freq: Every day | ORAL | Status: DC
  Administered 2020-01-08 – 2020-01-11 (×4): 250 mg via ORAL
  Filled 2020-01-08 (×4): qty 2.5
  Filled 2020-01-08: qty 3
  Filled 2020-01-08 (×2): qty 2.5

## 2020-01-08 MED ORDER — ENSURE ENLIVE PO LIQD: 237.0000 mL | Freq: Two times a day (BID) | ORAL | Status: DC

## 2020-01-08 MED ORDER — LORAZEPAM 1 MG PO TABS: 1.0000 mg | ORAL_TABLET | Freq: Four times a day (QID) | ORAL | Status: DC | PRN

## 2020-01-08 MED ORDER — OLANZAPINE 10 MG PO TABS
10.0000 mg | ORAL_TABLET | Freq: Every day | ORAL | Status: DC
  Filled 2020-01-08 (×2): qty 1

## 2020-01-08 MED ORDER — POTASSIUM CHLORIDE CRYS ER 20 MEQ PO TBCR
20.0000 meq | EXTENDED_RELEASE_TABLET | Freq: Two times a day (BID) | ORAL | Status: AC
  Administered 2020-01-08 (×2): 20 meq via ORAL
  Filled 2020-01-08 (×2): qty 1
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
## 2020-01-08 NOTE — H&P (Signed)
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  Patient is a 24 year old male with a past medical history significant for recent neurosurgery for oligodendroglioma and a past psychiatric history for psychotic symptoms for at least 2 to 4 years, and prior to that substance abuse involving methamphetamines and cocaine.  Most recently the patient presented to the [facility] emergency department on 10/22 after an intentional overdose of Seroquel with vodka.  He was brought to the emergency department by emergency medical services.  The patient stated that he had been hearing a voice in his head for several years, and was unable to cope with it any longer and decided to overdose.  The patient stated that he had had the psychotic symptoms for quite a while.  The patient is not a great historian, and it may be related to this most recent brain tumor and neurosurgery.  His focus and attention was not good.  His mother stated that he had used methamphetamines and drugs in the past.  On his drug screen on admission there was cocaine in his system and his blood alcohol was 189.  He had been seen at the behavioral health urgent care center on 01/01/2020.  The mother stated that he had been referred to psychiatry, but it did take in at least 3 months to be seen.  Both the patient and the mother were unaware of any previous psychiatric admissions or psychiatric evaluations.  There was a note in the chart from an emergency room visit on 03/22/2019 from [facility].  He was referred to psychiatry at that time, but the patient apparently did not follow-up.  He has been diagnosed with  attention deficit disorder and generalized anxiety disorder and has been treated by family medicine practices.  He is last prescription for prescribed amphetamines was in March of this year.  He did have a prescription for pain medicines after his neurosurgery.  His neurosurgeon had previously prescribed Abilify and Lamictal.  It does sound as though the Lamictal was secondary to seizure activity.  The referral for a psychiatric consultation from neurosurgery was done in May of this year.  It is unclear whether or not the patient followed up on any of those issues.  The patient complains of a male voice from the [employer]BI talking to him about investigating him for murder.  The patient stated today that he does not believe it is a hallucination, and he does believe he is being investigated.  He believes he is being investigated for murder.  He is unclear why anyone would be able to investigate him, or why anyone would.  The initial diagnosis was schizoaffective disorder and he was prescribed Seroquel, BuSpar, Wellbutrin.  He then overdosed on the 21st or 22nd and presented to the emergency department thereafter.  He continues to endorse auditory hallucinations and paranoid delusions.  He denied any suicidal or homicidal ideation.  He does state that he is depressed over his tumor, and over this paranoid torturous delusion.  He was transferred to our facility for evaluation and stabilization.  Associated Signs/Symptoms: Depression Symptoms:  depressed mood, anhedonia, insomnia, psychomotor agitation, fatigue, feelings of worthlessness/guilt, difficulty concentrating, suicidal thoughts with specific plan, suicidal attempt, loss of energy/fatigue,  disturbed sleep, Duration of Depression Symptoms: No data recorded (Hypo) Manic Symptoms:  Delusions, Hallucinations, Impulsivity, Irritable Mood, Labiality of Mood, Anxiety Symptoms:  Excessive Worry, Psychotic Symptoms:  Delusions, Hallucinations:  Auditory Paranoia, Duration of Psychotic Symptoms: No data recorded PTSD Symptoms: Had a traumatic exposure:  Recent brain surgery for brain tumor. Total Time spent with patient: 1 hour  Past Psychiatric History: His history is somewhat limited.  It sounds like the psychotic symptoms have been present for several years.  He is also abused substances in the past including methamphetamines and cocaine.  Depending on which source you read from the psychotic symptoms were present prior to the diagnosis of the brain tumor.  It is unclear whether or not the brain tumor was most likely present at the time when the symptoms occurred.  He has been referred to psychiatry in the past, but it does not appear as though he ever followed up.  His last exposure to psychiatry was at the behavioral health urgent care center on 10/21 or 2 days prior to his overdose.  Is the patient at risk to self? Yes.    Has the patient been a risk to self in the past 6 months? No.  Has the patient been a risk to self within the distant past? No.  Is the patient a risk to others? No.  Has the patient been a risk to others in the past 6 months? No.  Has the patient been a risk to others within the distant past? No.   Prior Inpatient Therapy:   Prior Outpatient Therapy:    Alcohol Screening: 1. How often do you have a drink containing alcohol?: 4 or more times a week 2. How many drinks containing alcohol do you have on a typical day when you are drinking?: 5 or 6 3. How often do you have six or more drinks on one occasion?: Less than monthly AUDIT-C Score: 7 4. How often during the last year have you found that you were not able to stop drinking once you had started?: Never 5. How often during the last year have you failed to do what was normally expected from you because of drinking?: Never 6. How often during the last year have you needed a first drink in the morning to get yourself going after a heavy drinking session?:  Less than monthly 7. How often during the last year have you had a feeling of guilt of remorse after drinking?: Less than monthly 8. How often during the last year have you been unable to remember what happened the night before because you had been drinking?: Less than monthly 9. Have you or someone else been injured as a result of your drinking?: No 10. Has a relative or friend or a doctor or another health worker been concerned about your drinking or suggested you cut down?: Yes, during the last year Alcohol Use Disorder Identification Test Final Score (AUDIT): 14 Substance Abuse History in the last 12 months:  Yes.   Consequences of Substance Abuse: It is unclear, but clearly could have worsened things. Previous Psychotropic Medications: Yes  Psychological Evaluations: No  Past Medical History:  Past Medical History:  Diagnosis Date  . ADD (attention deficit disorder)   . ADHD   . 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 Headache   . Psychotic disorder ([place])   . Seizures ([place])     Past Surgical History:  Procedure Laterality Date  . APPLICATION OF CRANIAL NAVIGATION N/A 07/02/2019   Procedure:  APPLICATION OF CRANIAL NAVIGATION;  Surgeon: Judith Part, MD;  Location: [place];  Service: Neurosurgery;  Laterality: N/A;  . CRANIOTOMY N/A 07/03/2019   Procedure: CRANIOTOMY HEMATOMA EVACUATION SUBDURAL;  Surgeon: Judith Part, MD;  Location: [place];  Service: Neurosurgery;  Laterality: N/A;  . CRANIOTOMY Right 07/03/2019   Procedure: CRANIOTOMY HEMATOMA EVACUATION SUBDURAL;  Surgeon: Judith Part, MD;  Location: [place];  Service: Neurosurgery;  Laterality: Right;  . CRANIOTOMY Right 07/02/2019   Procedure: CRANIOTOMY TUMOR EXCISION with Lucky Rathke;  Surgeon: Judith Part, MD;  Location: [place];  Service: Neurosurgery;  Laterality: Right;  posterior   Family History: History reviewed. No pertinent family history. Family Psychiatric  History: His mother stated that the  patient's grandmother had used Xanax, and apparently was depressed near the end of her life.  She suffered from chronic obstructive pulmonary disease. Tobacco Screening:   Social History:  Social History   Substance and Sexual Activity  Alcohol Use Yes   Comment: last drink: "3 nights ago" when he had "1/4th of a bottle," has been drinking daily for several weeks from a "few beers to a bottle of liquor"     Social History   Substance and Sexual Activity  Drug Use Yes  . Types: Cocaine   Comment: last use "couple of weeks ago" when he had "2 lines,"  frequency "once in a blue moon"    Additional Social History:                           Allergies:  No Known Allergies Lab Results:  Results for orders placed or performed during the hospital encounter of 01/07/20 (from the past 48 hour(s))  TSH     Status: None   Collection Time: 01/08/20  6:45 AM  Result Value Ref Range   TSH 1.410 0.350 - 4.500 uIU/mL    Comment: Performed by a 3rd Generation assay with a functional sensitivity of <=0.01 uIU/mL. Performed at [facility], [place] [address], [place], [place] [address]     Blood Alcohol level:  Lab Results  Component Value Date   ETH 189 (H) 01/03/2020   ETH <10 16/38/4665    Metabolic Disorder Labs:  No results found for: HGBA1C, MPG No results found for: PROLACTIN Lab Results  Component Value Date   TRIG 49 07/03/2019    Current Medications: Current Facility-Administered Medications  Medication Dose Route Frequency Provider Last Rate Last Admin  . acetaminophen (TYLENOL) tablet 650 mg  650 mg Oral Q4H PRN Suella Broad, FNP      . alum & mag hydroxide-simeth (MAALOX/MYLANTA) 200-200-20 MG/5ML suspension 30 mL  30 mL Oral Q4H PRN Starkes-Perry, Gayland Curry, FNP      . busPIRone (BUSPAR) tablet 10 mg  10 mg Oral Daily Suella Broad, FNP   10 mg at 01/08/20 0827  . docusate sodium (COLACE) capsule 100 mg  100 mg Oral BID  Suella Broad, FNP   100 mg at 01/08/20 0827  . feeding supplement (BOOST / RESOURCE BREEZE) liquid 1 Container  1 Container Oral BID BM Sharma Covert, MD      . lamoTRIgine (LAMICTAL) tablet 200 mg  200 mg Oral Daily Sharma Covert, MD      . lamoTRIgine (LAMICTAL) tablet 250 mg  250 mg Oral QHS Sharma Covert, MD      . LORazepam (ATIVAN) tablet 1 mg  1 mg Oral  Q6H PRN Sharma Covert, MD      . magnesium hydroxide (MILK OF MAGNESIA) suspension 30 mL  30 mL Oral Daily PRN Starkes-Perry, Gayland Curry, FNP      . nicotine (NICODERM CQ - dosed in mg/24 hours) patch 21 mg  21 mg Transdermal Daily Suella Broad, FNP   21 mg at 01/08/20 0827  . OLANZapine (ZYPREXA) tablet 10 mg  10 mg Oral QHS Sharma Covert, MD      . OLANZapine [facility]) tablet 2.5 mg  2.5 mg Oral Daily Sharma Covert, MD      . ondansetron [facility]) tablet 8 mg  8 mg Oral Q8H PRN Sharma Covert, MD      . potassium chloride SA (KLOR-CON) CR tablet 20 mEq  20 mEq Oral BID Sharma Covert, MD      . sertraline (ZOLOFT) tablet 25 mg  25 mg Oral Daily Sharma Covert, MD      . temozolomide [facility]) capsule 100 mg  100 mg Oral Daily Sharma Covert, MD       PTA Medications: Medications Prior to Admission  Medication Sig Dispense Refill Last Dose  . buPROPion (WELLBUTRIN XL) 150 MG 24 hr tablet Take 1 tablet (150 mg total) by mouth every morning. 30 tablet 2   . busPIRone (BUSPAR) 10 MG tablet Take 1 tablet (10 mg total) by mouth daily. 90 tablet 2   . lamoTRIgine (LAMICTAL) 100 MG tablet Take 1 tablet (100 mg total) by mouth 2 (two) times daily. 60 tablet 0   . lamoTRIgine (LAMICTAL) 25 MG tablet Take 2 tablets (50 mg total) by mouth at bedtime. 1 tablet 0   . QUEtiapine (SEROQUEL) 100 MG tablet Take 1 tablet (100 mg total) by mouth at bedtime. Start dose at 50 mg at bedtime for the first week. If tolerated well without any side effects, increase dosage by 50 mg each week. Max dose  to not exceed 200 mg 30 tablet 2   . temozolomide (TEMODAR) 100 MG capsule Take 100 mg by mouth daily. May take on an empty stomach or at bedtime to decrease nausea & vomiting.       Musculoskeletal: Strength & Muscle Tone: within normal limits Gait & Station: shuffle Patient leans: N/A  Psychiatric Specialty Exam: Physical Exam Vitals and nursing note reviewed.  HENT:     Head: Normocephalic.  Pulmonary:     Effort: Pulmonary effort is normal.  Neurological:     General: No focal deficit present.     Mental Status: He is alert.     Review of Systems  Blood pressure 130/89, pulse 80, temperature 98.4 F (36.9 C), temperature source Oral, resp. rate 18, height 5\' 7"  (1.702 m), weight 64.2 kg, SpO2 99 %.Body mass index is 22.17 kg/m.  General Appearance: Disheveled  Eye Contact:  Fair  Speech:  Normal Rate  Volume:  Decreased  Mood:  Anxious, Depressed and Dysphoric  Affect:  Flat  Thought Process:  Goal Directed and Descriptions of Associations: Circumstantial  Orientation:  Negative  Thought Content:  Hallucinations: Auditory and Paranoid Ideation  Suicidal Thoughts:  No  Homicidal Thoughts:  No  Memory:  Immediate;   Poor Recent;   Poor Remote;   Poor  Judgement:  Impaired  Insight:  Fair  Psychomotor Activity:  Decreased  Concentration:  Concentration: Fair  Recall:  [employer] of Knowledge:  Fair  Language:  Fair  Akathisia:  Negative  Handed:  Right  AIMS (if indicated):     Assets:  Desire for Improvement Resilience Social Support  ADL's:  Intact  Cognition:  Impaired,  Moderate  Sleep:  Number of Hours: 6.25    Treatment Plan Summary: Daily contact with patient to assess and evaluate symptoms and progress in treatment, Medication management and Plan : Patient is seen and examined.  Patient is a 24 year old male with the above-stated past medical and psychiatric history who was transferred to our facility secondary to recent overdose in a suicide attempt  as well as continued psychotic symptoms.  He will be admitted to the hospital.  He will be integrated in the milieu.  He will be encouraged to attend groups.  The patient is not a great historian, and I think a lot of that has to do with cognitive deficits secondary to his recent brain surgery.  I have spoken to his mother.  He has received Abilify and Lamictal from his neuro oncologist.  It does sound like he has had seizure activity.  He was prescribed Wellbutrin at the [facility], but I am going to stop that given the reported seizure activity.  Additionally it does not appear as though the Seroquel was of any benefit, and clearly the Abilify he received before that was not effective is well.  I am a started on Zyprexa 2.5 mg p.o. daily today, and then start Zyprexa 10 mg p.o. nightly.  We will see if that has any benefit with regard to his psychosis.  With regard to his depression we will start him on Zoloft 25 mg p.o. daily and this to be titrated during the course of hospitalization.  We will continue the BuSpar for anxiety for now.  His Lamictal dose is apparently 200 mg p.o. daily and 250 mg p.o. nightly.  We will continue that.  I will also place him on seizure precautions.  I have reviewed the notes from neuro-oncology, and his chemotherapy agent will be continued.  I will also add Zofran 8 mg p.o. every 6 hours as needed nausea given the side effect profile of that.  The patient also previously underwent radiation therapy.  It does appear that that has been completed.  A CT scan of his head which was obtained on 10/23 on admission showed no acute intracranial abnormality, and there was right occipital encephalomalacia as well as remote craniotomy site evidence.  Review of his laboratory showed a blood sugar this morning of 105.  His potassium on 10/26 was slightly low at 3.3, and that will be supplemented.  Creatinine was normal at 0.89.  Liver function enzymes were normal.  CBC was essentially normal except  for slightly low normal platelets at 144,000.  Acetaminophen was less than 10, salicylate was less than 7.  His blood alcohol on admission from 10/22 was 189.  We will put lorazepam 1 mg p.o. every 6 hours as needed a CIWA greater than 10.  His drug screen was positive for cocaine.  An MRI from 9/30 showed decreased size of small focus of contrast enhancement adjacent to the atrium of the right lateral ventricle, and was thought to be more postoperative.  His EKG showed a normal sinus rhythm with a QTc interval 442.  Currently his blood pressure is stable, and he is afebrile.  Observation Level/Precautions:  15 minute checks Seizure  Laboratory:  Chemistry Profile  Psychotherapy:    Medications:    Consultations:    Discharge Concerns:    Estimated LOS:  Other:  Physician Treatment Plan for Primary Diagnosis: <principal problem not specified> Long Term Goal(s): Improvement in symptoms so as ready for discharge  Short Term Goals: Ability to identify changes in lifestyle to reduce recurrence of condition will improve, Ability to verbalize feelings will improve, Ability to disclose and discuss suicidal ideas, Ability to demonstrate self-control will improve, Ability to identify and develop effective coping behaviors will improve, Ability to maintain clinical measurements within normal limits will improve and Ability to identify triggers associated with substance abuse/mental health issues will improve  Physician Treatment Plan for Secondary Diagnosis: Active Problems:   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[place])  Long Term Goal(s): Improvement in symptoms so as ready for discharge  Short Term Goals: Ability to identify changes in lifestyle to reduce recurrence of condition will improve, Ability to verbalize feelings will improve, Ability to disclose and discuss suicidal ideas, Ability to demonstrate self-control will improve, Ability to identify and develop effective coping behaviors will improve,  Ability to maintain clinical measurements within normal limits will improve and Ability to identify triggers associated with substance abuse/mental health issues will improve  I certify that inpatient services furnished can reasonably be expected to improve the patient's condition.    Sharma Covert, MD 10/27/202110:21 AM

## 2020-01-08 NOTE — Progress Notes (Signed)
NUTRITION ASSESSMENT  Pt identified as at risk on the Malnutrition Screen Tool  INTERVENTION: 1. Supplements: Boost Breeze po BID, each supplement provides 250 kcal and 9 grams of protein   NUTRITION DIAGNOSIS: Unintentional weight loss related to sub-optimal intake as evidenced by pt report.   Goal: Pt to meet >/= 90% of their estimated nutrition needs.  Monitor:  PO intake  Assessment:  Pt admitted following suicidal attempt of overdose on prescription medications. Pt also drinking alcohol PTA. Pt with history of brain mass, s/p craniotomy and chemotherapy. Pt's weight has remained stable per weight records.  Boost Breeze supplements have been ordered, will continue.   Height: Ht Readings from Last 1 Encounters:  01/07/20 5\' 7"  (1.702 m)    Weight: Wt Readings from Last 1 Encounters:  01/07/20 64.2 kg    Weight Hx: Wt Readings from Last 10 Encounters:  01/07/20 64.2 kg  01/03/20 63.5 kg  01/01/20 64.2 kg  12/05/19 63.4 kg  11/07/19 60.5 kg  10/15/19 62 kg  08/20/19 63 kg  07/30/19 63.7 kg  07/19/19 63.6 kg  07/02/19 59 kg    BMI:  Body mass index is 22.17 kg/m. Pt meets criteria for normal based on current BMI.  Estimated Nutritional Needs: Kcal: 25-30 kcal/kg Protein: > 1 gram protein/kg Fluid: 1 ml/kcal  Diet Order:  Diet Order            Diet regular Room service appropriate? Yes; Fluid consistency: Thin  Diet effective now                Pt is also offered choice of unit snacks mid-morning and mid-afternoon.  Pt is eating as desired.   Lab results and medications reviewed.   Clayton Bibles, MS, RD, LDN Inpatient Clinical Dietitian Contact information available via Amion

## 2020-01-08 NOTE — Progress Notes (Deleted)
   01/07/20 2220  COVID-19 Daily Checkoff  Have you had a fever (temp > 37.80C/100F)  in the past 24 hours?  No  COVID-19 EXPOSURE  Have you traveled outside the state in the past 14 days? No  Have you been in contact with someone with a confirmed diagnosis of COVID-19 or PUI in the past 14 days without wearing appropriate PPE? No  Have you been living in the same home as a person with confirmed diagnosis of COVID-19 or a PUI (household contact)? No  Have you been diagnosed with COVID-19? No

## 2020-01-08 NOTE — Tx Team (Addendum)
Initial Treatment Plan 01/07/2020 10:30 PM Stephen Beard [id number]:[id number]   PATIENT STRESSORS: Loss of grandma, great grandma, and best friend Marital or family conflict Medication change or noncompliance   PATIENT STRENGTHS: Ability for insight Active sense of humor Communication skills General fund of knowledge Motivation for treatment/growth Supportive family/friends   PATIENT IDENTIFIED PROBLEMS: paranoia  Somatic delusions  AH of "a lady in my head 24/7" telling him that he has "HPV, undetectable HIV, or a rare blood infection"  "all done, tired of hearing this voice"  Was dating a guy that was "narcissitic and controlling"  psychosis           DISCHARGE CRITERIA:  Improved stabilization in mood, thinking, and/or behavior Medical problems require only outpatient monitoring Motivation to continue treatment in a less acute level of care Need for constant or close observation no longer present Safe-care adequate arrangements made Verbal commitment to aftercare and medication compliance  PRELIMINARY DISCHARGE PLAN: Attend aftercare/continuing care group Outpatient therapy Return to previous living arrangement  PATIENT/FAMILY INVOLVEMENT: This treatment plan has been presented to and reviewed with the patient, Stephen Beard.  The patient and family have been given the opportunity to ask questions and make suggestions.  Harlow Asa, RN 01/07/2020, 10:30 PM

## 2020-01-08 NOTE — [facility] Suicide Risk Assessment (Signed)
[place] INPATIENT:  Family/Significant Other Suicide Prevention Education Suicide Prevention Education:  Patient unable to provide consent for Family/Significant Other Suicide Prevention Education: The patient has been unable to provide written consent for family/significant other to be provided Family/Significant Other Suicide Prevention Education during admission and/or prior to discharge. Physician notified.   SPE completed with patient, as patient has been unable to provide consent for family contact. SPE pamphlet placed on chart for patient to share with supports at discharge.  Pt reported that there are weapons in the home but they are secured.
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
## 2020-01-08 NOTE — Progress Notes (Signed)
   01/07/20 2220  COVID-19 Daily Checkoff  Have you had a fever (temp > 37.80C/100F)  in the past 24 hours?  No  COVID-19 EXPOSURE  Have you traveled outside the state in the past 14 days? No  Have you been in contact with someone with a confirmed diagnosis of COVID-19 or PUI in the past 14 days without wearing appropriate PPE? No  Have you been living in the same home as a person with confirmed diagnosis of COVID-19 or a PUI (household contact)? No  Have you been diagnosed with COVID-19? No

## 2020-01-08 NOTE — Telephone Encounter (Signed)
Received call from pt's mother. She sates that her son has been admitted to [facility] after an intentional suicide attempt.  See ED note. His mother and the Behavior Health Pharmacist and asking for instructions related to his chemotherapy  [facility] Pharm[facility] # is [phone].  His mother is requesting a call back as well.

## 2020-01-08 NOTE — Progress Notes (Signed)
Patient denies SI, HI and AVH but does endorse auditory hallucinations.  Patient stated the hallucinations began a year ago and have been distressing.  Patient reported drug use and drinking use as a way to cope with hearing voices.  Patient currently denies any withdrawal symptoms. Patient has been calm and pleasant on the unit.   Assess patient for safety offer medications as prescribed engage patient in 1:1 staff talks.   Patient able to contract for safety.  Continue to monitor as planned.

## 2020-01-08 NOTE — Tx Team (Signed)
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 acetaminophen (TYLENOL) tablet 650 mg  650 mg Oral Q4H PRN Suella Broad, FNP      . alum & mag hydroxide-simeth (MAALOX/MYLANTA) 200-200-20 MG/5ML suspension 30 mL  30 mL Oral Q4H PRN Starkes-Perry, Gayland Curry, FNP      . busPIRone (BUSPAR) tablet 10 mg  10 mg Oral Daily Suella Broad, FNP   10 mg at 01/08/20 0827  . docusate sodium (COLACE) capsule 100 mg  100 mg Oral BID Suella Broad, FNP   100 mg at 01/08/20 0827  . feeding supplement (BOOST / RESOURCE BREEZE) liquid 1 Container  1 Container Oral BID BM Sharma Covert, MD      . lamoTRIgine (LAMICTAL) tablet 200 mg  200 mg Oral Daily Sharma Covert, MD      . lamoTRIgine (LAMICTAL) tablet 250 mg  250 mg Oral QHS Sharma Covert, MD      . LORazepam (ATIVAN) tablet 1 mg  1 mg Oral Q6H PRN Sharma Covert, MD      . magnesium hydroxide (MILK OF MAGNESIA) suspension 30 mL  30 mL Oral Daily PRN Starkes-Perry, Gayland Curry, FNP      . nicotine (NICODERM CQ - dosed in mg/24 hours) patch 21 mg  21 mg Transdermal Daily Suella Broad, FNP   21 mg at 01/08/20 0827  . OLANZapine (ZYPREXA) tablet 10 mg  10 mg Oral QHS Sharma Covert, MD      . OLANZapine [facility]) tablet 2.5 mg  2.5 mg Oral Daily Sharma Covert, MD      . ondansetron [facility]) tablet 8 mg  8 mg Oral Q8H PRN Sharma Covert, MD      . temozolomide [facility]) capsule 100 mg  100 mg Oral Daily Sharma Covert, MD       PTA Medications: Medications Prior to Admission  Medication Sig Dispense Refill Last Dose  . buPROPion (WELLBUTRIN XL) 150  MG 24 hr tablet Take 1 tablet (150 mg total) by mouth every morning. 30 tablet 2   . busPIRone (BUSPAR) 10 MG tablet Take 1 tablet (10 mg total) by mouth daily. 90 tablet 2   . lamoTRIgine (LAMICTAL) 100 MG tablet Take 1 tablet (100 mg total) by mouth 2 (two) times daily. 60 tablet 0   . lamoTRIgine (LAMICTAL) 25 MG tablet Take 2 tablets (50 mg total) by mouth at bedtime. 1 tablet 0   . QUEtiapine (SEROQUEL) 100 MG tablet Take 1 tablet (100 mg total) by mouth at bedtime. Start dose at 50 mg at bedtime for the first week. If tolerated well without any side effects, increase dosage by 50 mg each week. Max dose to not exceed 200 mg 30 tablet 2   . temozolomide (TEMODAR) 100 MG capsule Take 100 mg by mouth daily. May take on an empty stomach or at bedtime to decrease nausea & vomiting.       Patient Stressors: Loss of grandma, great grandma, and best friend Marital or family conflict Medication change or noncompliance  Patient Strengths: Ability for insight Active sense of humor Communication skills General fund of knowledge Motivation for treatment/growth Supportive family/friends  Treatment  Modalities: Medication Management, Group therapy, Case management,  1 to 1 session with clinician, Psychoeducation, Recreational therapy.   Physician Treatment Plan for Primary Diagnosis: <principal problem not specified> Long Term Goal(s):     Short Term Goals:    Medication Management: Evaluate patient's response, side effects, and tolerance of medication regimen.  Therapeutic Interventions: 1 to 1 sessions, Unit Group sessions and Medication administration.  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  Therapeutic Interventions: 1 to 1 sessions,  Unit Group sessions and Medication administration.  Evaluation of Outcomes: Not Met   RN Treatment Plan for Primary Diagnosis: <principal problem not specified> Long Term Goal(s): Knowledge of disease and therapeutic regimen to maintain health will improve  Short Term Goals: Ability to remain free from injury will improve, Ability to participate in decision making will improve, Ability to verbalize feelings will improve, Ability to disclose and discuss suicidal ideas and Ability to identify and develop effective coping behaviors will improve  Medication Management: RN will administer medications as ordered by provider, will assess and evaluate patient's response and provide education to patient for prescribed medication. RN will report any adverse and/or side effects to prescribing provider.  Therapeutic Interventions: 1 on 1 counseling sessions, Psychoeducation, Medication administration, Evaluate responses to treatment, Monitor vital signs and CBGs as ordered, Perform/monitor CIWA, COWS, AIMS and Fall Risk screenings as ordered, Perform wound care treatments as ordered.  Evaluation of Outcomes: Not Met   LCSW Treatment Plan for Primary Diagnosis: <principal problem not specified> Long Term Goal(s): Safe transition to appropriate next level of care at discharge, Engage patient in therapeutic group addressing interpersonal concerns.  Short Term Goals: Engage patient in aftercare planning with referrals and resources, Increase social support, Increase emotional regulation, Facilitate acceptance of mental health diagnosis and concerns, Identify triggers associated with mental health/substance abuse issues and Increase skills for wellness and recovery  Therapeutic Interventions: Assess for all discharge needs, 1 to 1 time with Social worker, Explore available resources and support systems, Assess for adequacy in community support network, Educate family and significant other(s) on suicide  prevention, Complete Psychosocial Assessment, Interpersonal group therapy.  Evaluation of Outcomes: Not Met   Progress in Treatment: Attending groups: No. Participating in groups: No. Taking medication as prescribed: Yes. Toleration medication: Yes. Family/Significant other contact made: No, will contact:  If consents are given  Patient understands diagnosis: No. Discussing patient identified problems/goals with staff: Yes. Medical problems stabilized or resolved: Yes. Denies suicidal/homicidal ideation: Yes. Issues/concerns per patient self-inventory: No.   New problem(s) identified: No, Describe:  None   New Short Term/Long Term Goal(s): medication stabilization, elimination of SI thoughts, development of comprehensive mental wellness plan.   Patient Goals:  "To get the right medications and get back on track"   Discharge Plan or Barriers: Patient recently admitted. CSW will continue to follow and assess for appropriate referrals and possible discharge planning.   Reason for Continuation of Hospitalization: Depression Medication stabilization Suicidal ideation Withdrawal symptoms  Estimated Length of Stay: 3 to 5 days  Attendees: Patient: Stephen Beard  01/08/2020   Physician: Myles Lipps , MD 01/08/2020   Nursing:  01/08/2020   RN Care Manager: 01/08/2020   Social Worker: Verdis Frederickson, Uncertain 01/08/2020   Recreational Therapist:  01/08/2020  Other:  01/08/2020   Other:  01/08/2020   Other: 01/08/2020      Scribe for Treatment Team: Darleen Crocker, [place] 01/08/2020 9:27 AM
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
## 2020-01-09 MED ORDER — SERTRALINE HCL 50 MG PO TABS
50.0000 mg | ORAL_TABLET | Freq: Every day | ORAL | Status: DC
  Administered 2020-01-10 – 2020-01-12 (×3): 50 mg via ORAL
  Filled 2020-01-09 (×5): qty 1

## 2020-01-09 MED ORDER — DOCUSATE SODIUM 100 MG PO CAPS: 100.0000 mg | ORAL_CAPSULE | Freq: Two times a day (BID) | ORAL | Status: DC | PRN

## 2020-01-09 MED ORDER — BUSPIRONE HCL 10 MG PO TABS
10.0000 mg | ORAL_TABLET | Freq: Two times a day (BID) | ORAL | Status: DC
  Administered 2020-01-09 – 2020-01-12 (×6): 10 mg via ORAL
  Filled 2020-01-09 (×10): qty 1

## 2020-01-09 NOTE — Progress Notes (Signed)
Patient is cooperative with treatment, he denies SI, HI and AVH. He was compliant with medication regime on shift. Patient currently denies any withdrawal symptoms when asked and none are visible at this time. Patient has been calm and pleasant on the unit. He appears to be in bed resting quietly at this time.

## 2020-01-09 NOTE — Progress Notes (Signed)
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  Objective: Patient is seen and examined.  Patient is a 24 year old male with the above-stated past psychiatric history who is seen in follow-up.  He looks better today.  He has more affect and does not appear as depressed.  He stated that the auditory hallucinations have decreased.  He stated they are not as disturbing today.  He did appear to be able to smile and engage.  His vital signs are stable, he is afebrile.  He denied any suicidal or homicidal ideation.  Nursing notes reflect that he slept 7.5 hours yesterday.  Initially with his first dose of Zyprexa he was fairly sedated, and prior to leaving the hospital yesterday I decreased his daytime dose and nighttime dose.  He received 5 mg of Zyprexa last night.  His Lamictal was restarted at its previous dose which could add mood stability as well as seizure protection.  He also continues on BuSpar for anxiety.  He was started on Zoloft yesterday 25 mg p.o. daily.  He reported no side effects to his current medications.  I did communicate with his neuro oncologist yesterday, and they recommended holding his oral chemotherapy.  Review of his laboratories showed no new labs from yesterday outside of a TSH which was 1.410.  No evidence of alcohol withdrawal symptoms.  No evidence of any seizure disorder.  He is far less disorganized today, and seems to be able to provide a more intact history.  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 ADD (attention deficit disorder)   . ADHD   . 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 Headache   . Psychotic disorder ([place])   . Seizures ([place])     Past Surgical History:  Procedure Laterality Date  . APPLICATION OF CRANIAL NAVIGATION N/A 07/02/2019   Procedure: APPLICATION OF CRANIAL NAVIGATION;  Surgeon: Judith Part, MD;  Location: [place];  Service: Neurosurgery;  Laterality: N/A;  . CRANIOTOMY N/A 07/03/2019   Procedure: CRANIOTOMY HEMATOMA EVACUATION SUBDURAL;  Surgeon: Judith Part, MD;  Location: [place];  Service: Neurosurgery;  Laterality: N/A;  . CRANIOTOMY Right 07/03/2019   Procedure: CRANIOTOMY HEMATOMA EVACUATION SUBDURAL;  Surgeon: Judith Part, MD;  Location: [place];  Service: Neurosurgery;  Laterality: Right;  . CRANIOTOMY Right 07/02/2019   Procedure: CRANIOTOMY TUMOR EXCISION with Lucky Rathke;  Surgeon: Judith Part, MD;  Location: [place];  Service: Neurosurgery;  Laterality: Right;  posterior   Family History: History reviewed. No pertinent family history. Family Psychiatric  History: See admission H&P Social History:  Social History   Substance and Sexual Activity  Alcohol Use Yes   Comment: last drink: "3 nights ago" when he had "1/4th of a bottle," has been drinking daily for several weeks from a "few beers to a bottle of liquor"     Social History   Substance and Sexual Activity  Drug Use Yes  . Types: Cocaine   Comment: last use "couple of weeks  ago" when he had "2 lines,"  frequency "once in a blue moon"    Social History   Socioeconomic History  . Marital status: Single    Spouse name: Not on file  . Number of children: Not on file  . Years of education: Not on file  . Highest education level: Not on file  Occupational History  . Not on file  Tobacco Use  . Smoking status: Current Every Day Smoker    Packs/day: 1.00     Years: 12.00    Pack years: 12.00  . Smokeless tobacco: Current User    Types: Chew  Vaping Use  . Vaping Use: Never used  Substance and Sexual Activity  . Alcohol use: Yes    Comment: last drink: "3 nights ago" when he had "1/4th of a bottle," has been drinking daily for several weeks from a "few beers to a bottle of liquor"  . Drug use: Yes    Types: Cocaine    Comment: last use "couple of weeks ago" when he had "2 lines,"  frequency "once in a blue moon"  . Sexual activity: Not Currently  Other Topics Concern  . Not on file  Social History Narrative  . Not on file   Social Determinants of Health   Financial Resource Strain:   . Difficulty of Paying Living Expenses: Not on file  Food Insecurity:   . Worried About Charity fundraiser in the Last Year: Not on file  . Ran Out of Food in the Last Year: Not on file  Transportation Needs:   . Lack of Transportation (Medical): Not on file  . Lack of Transportation (Non-Medical): Not on file  Physical Activity:   . Days of Exercise per Week: Not on file  . Minutes of Exercise per Session: Not on file  Stress:   . Feeling of Stress : Not on file  Social Connections:   . Frequency of Communication with Friends and Family: Not on file  . Frequency of Social Gatherings with Friends and Family: Not on file  . Attends Religious Services: Not on file  . Active Member of Clubs or Organizations: Not on file  . Attends Archivist Meetings: Not on file  . Marital Status: Not on file   Additional Social History:                         Sleep: Good  Appetite:  Fair  Current Medications: Current Facility-Administered Medications  Medication Dose Route Frequency Provider Last Rate Last Admin  . acetaminophen (TYLENOL) tablet 650 mg  650 mg Oral Q4H PRN Suella Broad, FNP      . alum & mag hydroxide-simeth (MAALOX/MYLANTA) 200-200-20 MG/5ML suspension 30 mL  30 mL Oral Q4H PRN Starkes-Perry, Gayland Curry, FNP       . busPIRone (BUSPAR) tablet 10 mg  10 mg Oral Daily Suella Broad, FNP   10 mg at 01/09/20 0919  . docusate sodium (COLACE) capsule 100 mg  100 mg Oral BID Suella Broad, FNP   100 mg at 01/09/20 0916  . feeding supplement (BOOST / RESOURCE BREEZE) liquid 1 Container  1 Container Oral BID BM Sharma Covert, MD   1 Container at 01/09/20 [id number]  . lamoTRIgine (LAMICTAL) tablet 200 mg  200 mg Oral Daily Sharma Covert, MD   200 mg at 01/09/20 0916  . lamoTRIgine (LAMICTAL) tablet 250 mg  250 mg Oral QHS Reed Eifert,  Cordie Grice, MD   250 mg at 01/08/20 2115  . LORazepam (ATIVAN) tablet 1 mg  1 mg Oral Q6H PRN Sharma Covert, MD      . magnesium hydroxide (MILK OF MAGNESIA) suspension 30 mL  30 mL Oral Daily PRN Starkes-Perry, Gayland Curry, FNP      . nicotine (NICODERM CQ - dosed in mg/24 hours) patch 21 mg  21 mg Transdermal Daily Suella Broad, FNP   21 mg at 01/09/20 0918  . OLANZapine (ZYPREXA) tablet 5 mg  5 mg Oral QHS Sharma Covert, MD   5 mg at 01/08/20 2114  . sertraline (ZOLOFT) tablet 25 mg  25 mg Oral Daily Sharma Covert, MD   25 mg at 01/09/20 4259    Lab Results:  Results for orders placed or performed during the hospital encounter of 01/07/20 (from the past 48 hour(s))  TSH     Status: None   Collection Time: 01/08/20  6:45 AM  Result Value Ref Range   TSH 1.410 0.350 - 4.500 uIU/mL    Comment: Performed by a 3rd Generation assay with a functional sensitivity of <=0.01 uIU/mL. Performed at [facility], [place] [address] [place], [place] [address]     Blood Alcohol level:  Lab Results  Component Value Date   ETH 189 (H) 01/03/2020   ETH <10 56/43/3295    Metabolic Disorder Labs: No results found for: HGBA1C, MPG No results found for: PROLACTIN Lab Results  Component Value Date   TRIG 49 07/03/2019    Physical Findings: AIMS: Facial and Oral Movements Muscles of Facial Expression: None, normal Lips and Perioral  Area: None, normal Jaw: None, normal Tongue: None, normal,Extremity Movements Upper (arms, wrists, hands, fingers): None, normal Lower (legs, knees, ankles, toes): None, normal, Trunk Movements Neck, shoulders, hips: None, normal, Overall Severity Severity of abnormal movements (highest score from questions above): None, normal Incapacitation due to abnormal movements: None, normal Patient's awareness of abnormal movements (rate only patient's report): No Awareness, Dental Status Current problems with teeth and/or dentures?: No Does patient usually wear dentures?: No  CIWA:    COWS:     Musculoskeletal: Strength & Muscle Tone: within normal limits Gait & Station: ataxic Patient leans: N/A  Psychiatric Specialty Exam: Physical Exam Vitals and nursing note reviewed.  HENT:     Head: Normocephalic and atraumatic.  Pulmonary:     Effort: Pulmonary effort is normal.  Neurological:     General: No focal deficit present.     Mental Status: He is alert and oriented to person, place, and time.     Review of Systems  Blood pressure 130/89, pulse 80, temperature 98.4 F (36.9 C), temperature source Oral, resp. rate 18, height 5\' 7"  (1.702 m), weight 64.2 kg, SpO2 99 %.Body mass index is 22.17 kg/m.  General Appearance: Casual  Eye Contact:  Good  Speech:  Normal Rate  Volume:  Decreased  Mood:  Dysphoric  Affect:  Flat  Thought Process:  Goal Directed and Descriptions of Associations: Intact  Orientation:  Full (Time, Place, and Person)  Thought Content:  Delusions, Hallucinations: Auditory and Paranoid Ideation  Suicidal Thoughts:  No  Homicidal Thoughts:  No  Memory:  Immediate;   Fair Recent;   Fair Remote;   Fair  Judgement:  Intact  Insight:  Fair  Psychomotor Activity:  Decreased  Concentration:  Concentration: Fair and Attention Span: Fair  Recall:  [employer] of Knowledge:  Fair  Language:  Fair  Akathisia:  Negative  Handed:  Right  AIMS (if indicated):      Assets:  Desire for Improvement Housing Resilience  ADL's:  Intact  Cognition:  Impaired,  Mild  Sleep:  Number of Hours: 7.5     Treatment Plan Summary: Daily contact with patient to assess and evaluate symptoms and progress in treatment, Medication management and Plan : Patient is seen and examined.  Patient is a 24 year old male with the above-stated past psychiatric history who is seen in follow-up.   Diagnosis: 1.  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  Possible major depressive disorder 3.  Generalized anxiety disorder 4.  Alcohol use disorder 5.  Cocaine use disorder 6.  Previous oligodendroglioma  Pertinent findings on examination today: 1.  Decreased paranoia and auditory hallucinations. 2.  No gross alcohol withdrawal symptoms. 3.  Denies suicidal ideation. 4.  Less disorganized on presentation today.  Plan: 1.  Increase BuSpar to 10 mg p.o. twice daily for generalized anxiety disorder. 2.  Continue Colace 100 mg p.o. twice daily to change to as needed. 3.  Continue Lamictal 200 mg p.o. daily and 250 mg p.o. nightly for mood stability and seizure disorder. 4.  Continue lorazepam 1 mg p.o. every 6 hours as needed a CIWA greater than 10. 5.  Continue Zyprexa 5 mg p.o. nightly for psychosis. 6.  Increase Zoloft to 50 mg p.o. daily for depression. 7.  Disposition planning-in progress.  Sharma Covert, MD 01/09/2020, 10:23 AM

## 2020-01-09 NOTE — [facility] Group Notes (Signed)
Occupational Therapy Group Note Date: 01/09/2020 Group Topic/Focus: Stress Management  Group Description: Group encouraged increased participation and engagement through discussion focused on topic of stress management. Patients engaged interactively to discuss components of stress including physical signs, emotional signs, negative management strategies, and positive management strategies. Each individual identified one new stress management strategy they would like to try moving forward.    Therapeutic Goals: Identify current stressors Identify healthy vs unhealthy stress management strategies/techniques Discuss and identify physical and emotional signs of stress Participation Level: Did not attend   Individualization: Patient did not attend OT group session despite personal invitation. Pt declined and remained awake in bed.   Modes of Intervention: Discussion and Education   Plan: Continue to engage patient in OT groups 2 - 3x/week.  01/09/2020  Ponciano Ort, MOT, OTR/L

## 2020-01-09 NOTE — Progress Notes (Signed)
Psychoeducational Group Note  Date:  01/09/2020 Time:  2015  Group Topic/Focus:  wrap up group  Participation Level: Did Not Attend  Participation Quality:  Not Applicable  Affect:  Not Applicable  Cognitive:  Not Applicable  Insight:  Not Applicable  Engagement in Group: Not Applicable  Additional Comments:  Pt was notified that group was beginning but remained in bed.   Shellia Cleverly 01/09/2020, 8:58 PM

## 2020-01-09 NOTE — Progress Notes (Signed)
   01/09/20 1600  Psych Admission Type (Psych Patients Only)  Admission Status Involuntary  Psychosocial Assessment  Patient Complaints Anxiety  Eye Contact Fair  Facial Expression Anxious  Affect Appropriate to circumstance  Speech Logical/coherent;Tangential  Interaction Assertive  Motor Activity Slow;Unsteady  Appearance/Hygiene Unremarkable  Behavior Characteristics Cooperative  Mood Anxious  Thought Process  Coherency WDL  Content Delusions;Paranoia;Preoccupation  Delusions Paranoid;Somatic  Perception Hallucinations  Hallucination Auditory  Judgment Impaired  Confusion Mild  Danger to Self  Current suicidal ideation? Denies  Danger to Others  Danger to Others None reported or observed

## 2020-01-10 DIAGNOSIS — F39 Unspecified mood [affective] disorder: Secondary | ICD-10-CM

## 2020-01-10 NOTE — Progress Notes (Signed)
DAR NOTE: Patient presents with calm affect and pleasant mood.  Denies pain, auditory and visual hallucinations. Pt stated that he does not need to be here, stated all was a misunderstanding and requesting for discharge. Maintained on routine safety checks.  Medications given as prescribed.  Support and encouragement offered as needed. Will continue to monitor.

## 2020-01-10 NOTE — Progress Notes (Signed)
   01/10/20 2000  Psych Admission Type (Psych Patients Only)  Admission Status Involuntary  Psychosocial Assessment  Patient Complaints Anxiety  Eye Contact Fair  Facial Expression Other (Comment) (calm)  Affect Appropriate to circumstance  Speech Logical/coherent  Interaction Assertive  Motor Activity Slow  Appearance/Hygiene Unremarkable  Behavior Characteristics Cooperative;Calm  Mood Pleasant  Thought Process  Coherency WDL  Content WDL  Delusions WDL  Perception WDL  Hallucination None reported or observed  Judgment WDL  Confusion None  Danger to Self  Current suicidal ideation? Denies  Danger to Others  Danger to Others None reported or observed   Pt seen in dayroom interacting. Pt hopes to be discharged tomorrow. Pt states that he was taking Wellbutrin and it was working but medication changed to Zoloft d/t possible complications d/t his history of seizures. Pt also concerned about sleep. States he was prescribed Seroquel a couple weeks ago when he saw a provider at [facility]. It has not been prescribed here at [facility]. Pt advised to mention this to provider in the morning. Pt rates anxiety 3/10 and denies SI, HI, AVH and pain.

## 2020-01-10 NOTE — Progress Notes (Signed)
Pt denied SI/HI/AVH.  Pt reported that he felt he was ready to be discharged home.  Pt calm and cooperative on approach.  RN assessed for needs and concerns and administered medications per provider orders.  No interactions or adverse effects were noted.  Q 15 min safety checks remain in place.

## 2020-01-10 NOTE — Progress Notes (Signed)
Patient ID: Stephen Beard, male   DOB: Jul 26, 1995, 24 y.o.   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  Objective:   Patient seen, chart reviewed, case discussed with treatment team.  Patient reports improvement in mood.  He states that he is not experiencing any suicidal ideation.  He states that he feels much improved when he had first presented and attributes his presentation to feeling overwhelmed in the moment.  Patient states that he has been taking the medication and denies any side effects.  No adverse effects observed.  Patient is denying auditory hallucinations, stating that he has not heard any as of today.  He reports eating well and sleeping well.  Patient is requesting me discharge as he reports improvement.  Was explained to patient that he is running without symptoms as of this morning and that if he continues to show a lack of symptoms he can be discharged as soon as tomorrow.  Patient expressed understanding and agreement.  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 ADD (attention deficit disorder)   . ADHD   . 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 Headache   . Psychotic disorder ([place])   . Seizures ([place])     Past Surgical History:  Procedure Laterality Date  . APPLICATION OF CRANIAL NAVIGATION N/A 07/02/2019   Procedure: APPLICATION OF CRANIAL NAVIGATION;  Surgeon: Judith Part, MD;  Location: [place];  Service: Neurosurgery;  Laterality: N/A;  . CRANIOTOMY N/A 07/03/2019   Procedure: CRANIOTOMY HEMATOMA EVACUATION SUBDURAL;  Surgeon: Judith Part, MD;  Location: [place];  Service: Neurosurgery;  Laterality: N/A;  . CRANIOTOMY Right 07/03/2019   Procedure: CRANIOTOMY HEMATOMA EVACUATION SUBDURAL;  Surgeon: Judith Part, MD;  Location: [place];  Service: Neurosurgery;  Laterality: Right;  . CRANIOTOMY Right 07/02/2019   Procedure: CRANIOTOMY TUMOR EXCISION with Lucky Rathke;  Surgeon: Judith Part, MD;  Location: [place];  Service: Neurosurgery;  Laterality: Right;  posterior   Family History: History reviewed. No pertinent family history. Family Psychiatric  History: See admission H&P Social History:  Social History   Substance and Sexual Activity  Alcohol Use Yes   Comment: last drink: "3 nights ago" when he had "1/4th of a bottle," has been drinking daily for several weeks from a "few beers to a bottle of liquor"     Social History   Substance and Sexual Activity  Drug Use Yes  . Types: Cocaine   Comment: last use "couple of weeks ago" when he had "2 lines,"  frequency "once in a blue moon"    Social History   Socioeconomic History  . Marital status: Single    Spouse name: Not on file  . Number of children: Not on file  . Years of education: Not on file  . Highest education level: Not on file  Occupational History  . Not on file  Tobacco Use  . Smoking status: Current Every Day Smoker  Packs/day: 1.00    Years: 12.00    Pack years: 12.00  . Smokeless tobacco: Current User    Types: Chew  Vaping Use  . Vaping Use: Never used  Substance and Sexual Activity  . Alcohol use: Yes    Comment: last drink: "3 nights ago" when he had "1/4th of a bottle," has been drinking daily for several weeks from a "few beers to a bottle of liquor"  . Drug use: Yes    Types: Cocaine    Comment: last use "couple of weeks ago" when he had "2  lines,"  frequency "once in a blue moon"  . Sexual activity: Not Currently  Other Topics Concern  . Not on file  Social History Narrative  . Not on file   Social Determinants of Health   Financial Resource Strain:   . Difficulty of Paying Living Expenses: Not on file  Food Insecurity:   . Worried About Charity fundraiser in the Last Year: Not on file  . Ran Out of Food in the Last Year: Not on file  Transportation Needs:   . Lack of Transportation (Medical): Not on file  . Lack of Transportation (Non-Medical): Not on file  Physical Activity:   . Days of Exercise per Week: Not on file  . Minutes of Exercise per Session: Not on file  Stress:   . Feeling of Stress : Not on file  Social Connections:   . Frequency of Communication with Friends and Family: Not on file  . Frequency of Social Gatherings with Friends and Family: Not on file  . Attends Religious Services: Not on file  . Active Member of Clubs or Organizations: Not on file  . Attends Archivist Meetings: Not on file  . Marital Status: Not on file   Additional Social History:                         Sleep: Good  Appetite:  Fair  Current Medications: Current Facility-Administered Medications  Medication Dose Route Frequency Provider Last Rate Last Admin  . acetaminophen (TYLENOL) tablet 650 mg  650 mg Oral Q4H PRN Suella Broad, FNP      . alum & mag hydroxide-simeth (MAALOX/MYLANTA) 200-200-20 MG/5ML suspension 30 mL  30 mL Oral Q4H PRN Starkes-Perry, Gayland Curry, FNP      . busPIRone (BUSPAR) tablet 10 mg  10 mg Oral BID Sharma Covert, MD   10 mg at 01/10/20 0756  . docusate sodium (COLACE) capsule 100 mg  100 mg Oral BID PRN Sharma Covert, MD      . feeding supplement (BOOST / RESOURCE BREEZE) liquid 1 Container  1 Container Oral BID BM Sharma Covert, MD   1 Container at 01/09/20 [id number]  . lamoTRIgine (LAMICTAL) tablet 200 mg  200 mg Oral Daily Sharma Covert, MD   200 mg  at 01/10/20 0756  . lamoTRIgine (LAMICTAL) tablet 250 mg  250 mg Oral QHS Sharma Covert, MD   250 mg at 01/09/20 2137  . LORazepam (ATIVAN) tablet 1 mg  1 mg Oral Q6H PRN Sharma Covert, MD      . magnesium hydroxide (MILK OF MAGNESIA) suspension 30 mL  30 mL Oral Daily PRN Starkes-Perry, Gayland Curry, FNP      . nicotine (NICODERM CQ - dosed in mg/24 hours) patch 21 mg  21 mg Transdermal Daily Suella Broad, FNP   21 mg at 01/10/20  1027  . OLANZapine (ZYPREXA) tablet 5 mg  5 mg Oral QHS Sharma Covert, MD   5 mg at 01/09/20 2137  . sertraline (ZOLOFT) tablet 50 mg  50 mg Oral Daily Sharma Covert, MD   50 mg at 01/10/20 2536    Lab Results:  No results found for this or any previous visit (from the past 48 hour(s)).  Blood Alcohol level:  Lab Results  Component Value Date   ETH 189 (H) 01/03/2020   ETH <10 64/40/3474    Metabolic Disorder Labs: No results found for: HGBA1C, MPG No results found for: PROLACTIN Lab Results  Component Value Date   TRIG 49 07/03/2019    Physical Findings: AIMS: Facial and Oral Movements Muscles of Facial Expression: None, normal Lips and Perioral Area: None, normal Jaw: None, normal Tongue: None, normal,Extremity Movements Upper (arms, wrists, hands, fingers): None, normal Lower (legs, knees, ankles, toes): None, normal, Trunk Movements Neck, shoulders, hips: None, normal, Overall Severity Severity of abnormal movements (highest score from questions above): None, normal Incapacitation due to abnormal movements: None, normal Patient's awareness of abnormal movements (rate only patient's report): No Awareness, Dental Status Current problems with teeth and/or dentures?: No Does patient usually wear dentures?: No  CIWA:    COWS:     Musculoskeletal: Strength & Muscle Tone: within normal limits Gait & Station: ataxic Patient leans: N/A  Psychiatric Specialty Exam: Physical Exam Vitals and nursing note reviewed.  HENT:      Head: Normocephalic and atraumatic.  Pulmonary:     Effort: Pulmonary effort is normal.  Neurological:     General: No focal deficit present.     Mental Status: He is alert and oriented to person, place, and time.     Review of Systems  Blood pressure 113/76, pulse (!) 106, temperature 98.6 F (37 C), temperature source Oral, resp. rate 18, height 5\' 7"  (1.702 m), weight 64.2 kg, SpO2 97 %.Body mass index is 22.17 kg/m.  General Appearance: Casual  Eye Contact:  Good  Speech:  Normal Rate  Volume:  Decreased  Mood:  Euthymic  Affect:  Full Range  Thought Process:  Goal Directed and Descriptions of Associations: Intact  Orientation:  Full (Time, Place, and Person)  Thought Content:  Logical  Suicidal Thoughts:  No  Homicidal Thoughts:  No  Memory:  Immediate;   Fair Recent;   Fair Remote;   Fair  Judgement:  Intact  Insight:  Fair  Psychomotor Activity:  Normal  Concentration:  Concentration: Fair and Attention Span: Fair  Recall:  [employer] of Knowledge:  Fair  Language:  Fair  Akathisia:  Negative  Handed:  Right  AIMS (if indicated):     Assets:  Desire for Improvement Housing Resilience  ADL's:  Intact  Cognition:  Impaired,  Mild  Sleep:  Number of Hours: 6.75     Treatment Plan Summary: Daily contact with patient to assess and evaluate symptoms and progress in treatment, Medication management and Plan : Patient is seen and examined.  Patient is a 24 year old male with the above-stated past psychiatric history who is seen in follow-up.   Diagnosis: 1.  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  Possible major depressive disorder 3.  Generalized anxiety disorder 4.  Alcohol use disorder 5.  Cocaine use disorder 6.  Previous oligodendroglioma  Pertinent findings on examination today: 1.  Patient reports resolution oratory was negative and no further paranoia. 2.  No gross alcohol withdrawal symptoms. 3.  Denies suicidal  ideation. 4.  Patient's mood seems euthymic. Plan: 1.  Continue BuSpar to 10 mg p.o. twice daily for generalized anxiety disorder. 2.  Continue Colace 100 mg p.o. twice daily to change to as needed. 3.  Continue Lamictal 200 mg p.o. daily and 250 mg p.o. nightly for mood stability and seizure disorder. 4.  Continue lorazepam 1 mg p.o. every 6 hours as needed a CIWA greater than 10. 5.  Continue Zyprexa 5 mg p.o. nightly for psychosis. 6.  Continue Zoloft to 50 mg p.o. daily for depression. 7.  Disposition planning-in progress.  Dixie Dials, MD 01/10/2020, 3:07 PM

## 2020-01-10 NOTE — [facility] Group Notes (Signed)
[place] LCSW Group Therapy  01/10/2020 1:54 PM  Type of Therapy:  Coping Skills   Participation Level:  Active  Participation Quality:  Appropriate  Affect:  Appropriate  Cognitive:  Appropriate  Insight:  Engaged  Engagement in Therapy:  Developing/Improving  Modes of Intervention:  Discussion  Summary of Progress/Problems: Stephen Beard states that he likes to listen to music and spend time with his dog as a coping skill.  Stephen Beard states that he also likes to talk to friends and family.  Stephen Beard shared openly and was engaged with what his peers were sharing.  Stephen Beard remained in the group the entire time.   Stephen Beard 01/10/2020, 1:54 PM

## 2020-01-11 ENCOUNTER — Encounter (HOSPITAL_COMMUNITY): Payer: Self-pay | Admitting: Family

## 2020-01-11 DIAGNOSIS — F1010 Alcohol abuse, uncomplicated: Secondary | ICD-10-CM | POA: Diagnosis present

## 2020-01-11 MED ORDER — INFLUENZA VAC SPLIT QUAD 0.5 ML IM SUSY
0.5000 mL | PREFILLED_SYRINGE | INTRAMUSCULAR | Status: DC
  Filled 2020-01-11: qty 0.5

## 2020-01-11 NOTE — [facility] Group Notes (Signed)
.  Psychoeducational Group Note    Date: 01-11-20 Time: 0900-1000    Goal Setting   Purpose of Group: This group helps to provide patients with the steps of setting a goal that is specific, measurable, attainable, realistic and time specific. A discussion on how we keep ourselves stuck with negative self talk.    Participation Level:  Active  Participation Quality:  Appropriate  Affect:  Appropriate  Cognitive:  Appropriate  Insight:  Improving  Engagement in Group:  Engaged  Additional Comments:  Pt was able to identify a goal today. To write one positive about himself  Paulino Rily

## 2020-01-11 NOTE — Progress Notes (Signed)
   01/11/20 2000  Psych Admission Type (Psych Patients Only)  Admission Status Involuntary  Psychosocial Assessment  Patient Complaints Anxiety  Eye Contact Fair  Facial Expression Other (Comment) (appropriate)  Affect Appropriate to circumstance  Speech Logical/coherent  Interaction Assertive  Motor Activity Slow  Appearance/Hygiene Unremarkable  Behavior Characteristics Cooperative;Calm  Mood Pleasant  Thought Process  Coherency WDL  Content WDL  Delusions None reported or observed  Perception WDL  Hallucination None reported or observed  Judgment WDL  Confusion None  Danger to Self  Current suicidal ideation? Denies  Danger to Others  Danger to Others None reported or observed   Pt rates anxiety 2/10 d/t not being discharged today. "They said they want me to stay one more night to make sure everything's good. I was a little disappointed with that, but I'm ready to go tomorrow." Pt was told to get a good night's sleep and look forward to tomorrow. Pt attended groups and has been visible interacting in the dayroom. Pt denies SI, HI, AVH and pain.

## 2020-01-11 NOTE — Progress Notes (Signed)
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   "I'm feeling a lot better; not really hearing any voices or thinking about hurting myself."  Objective:  Stephen Beard, 57 y.o., male patient seen via tele psych by this provider, consulted with Dr. Leverne Humbles; treatment team, and chart reviewed on 01/11/20.  On evaluation Stephen Beard reports that he is feeling much better and currently denies suicidal ideation and auditory hallucination.  Patient reports that he is tolerating medications without adverse reactions and eating without difficulty.  States that he did have difficulty staying asleep last night.  "I'm taking a nap cause I woke about 5 o'clock this morning and couldn't go back to sleep.  Patient reports prior to his overdose on Seroquel it helped him sleep at night "I guess they discontinued it cause of the overdose." Patient reports that the hallucinations did not stat until the tumor "They found the tumor about a year ago and that's about the time the hallucinations started.  Patient states that he did have a history of depression and anxiety but no hallucinations until the tumor.  Patient reports he has missed one chemotherapy appointment but will reschedule "I do chemo 5 days on for a 28-day cycle; but when I was admitted to the hospital for the overdose I missed my next scheduled appointment."  Patient also reports that after discharge he will be going home with his mother to live and that outpatient psychiatric services was set up at the [facility] for follow up after discharge.  Patient also reports that he has been attending and participating  in group sessions.    During evaluation Stephen Beard was in bed, states that he was napping related to waking early and unable to fall back to sleep.  He is alert/oriented x 4; calm/cooperative; and mood is congruent with affect.  He does not appear to be responding to internal/external stimuli or delusional thoughts.  Patient denies suicidal/self-harm/homicidal ideation, psychosis, and paranoia.  Patient answered question appropriately.  Patient states he is ready to go home.       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 ADD (attention deficit disorder)   . ADHD   . 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 Headache   . Psychotic disorder ([place])   . Seizures ([place])     Past Surgical History:  Procedure Laterality Date  . APPLICATION OF CRANIAL NAVIGATION N/A 07/02/2019   Procedure: APPLICATION OF CRANIAL NAVIGATION;  Surgeon: Judith Part, MD;  Location: [place];  Service: Neurosurgery;  Laterality: N/A;  . CRANIOTOMY N/A 07/03/2019   Procedure: CRANIOTOMY HEMATOMA EVACUATION SUBDURAL;  Surgeon: Judith Part, MD;  Location: [place];  Service: Neurosurgery;  Laterality: N/A;  . CRANIOTOMY Right 07/03/2019   Procedure: CRANIOTOMY HEMATOMA EVACUATION SUBDURAL;  Surgeon:  Judith Part, MD;  Location: [place];  Service: Neurosurgery;  Laterality: Right;  . CRANIOTOMY Right 07/02/2019   Procedure: CRANIOTOMY TUMOR EXCISION with Lucky Rathke;  Surgeon: Judith Part, MD;  Location: [place];  Service: Neurosurgery;  Laterality: Right;  posterior   Family History: History reviewed.  No pertinent family history. Family Psychiatric  History: See admission H&P Social History:  Social History   Substance and Sexual Activity  Alcohol Use Yes   Comment: last drink: "3 nights ago" when he had "1/4th of a bottle," has been drinking daily for several weeks from a "few beers to a bottle of liquor"     Social History   Substance and Sexual Activity  Drug Use Yes  . Types: Cocaine   Comment: last use "couple of weeks ago" when he had "2 lines,"  frequency "once in a blue moon"    Social History   Socioeconomic History  . Marital status: Single    Spouse name: Not on file  . Number of children: Not on file  . Years of education: Not on file  . Highest education level: Not on file  Occupational History  . Not on file  Tobacco Use  . Smoking status: Current Every Day Smoker    Packs/day: 1.00    Years: 12.00    Pack years: 12.00  . Smokeless tobacco: Current User    Types: Chew  Vaping Use  . Vaping Use: Never used  Substance and Sexual Activity  . Alcohol use: Yes    Comment: last drink: "3 nights ago" when he had "1/4th of a bottle," has been drinking daily for several weeks from a "few beers to a bottle of liquor"  . Drug use: Yes    Types: Cocaine    Comment: last use "couple of weeks ago" when he had "2 lines,"  frequency "once in a blue moon"  . Sexual activity: Not Currently  Other Topics Concern  . Not on file  Social History Narrative  . Not on file   Social Determinants of Health   Financial Resource Strain:   . Difficulty of Paying Living Expenses: Not on file  Food Insecurity:   . Worried About Charity fundraiser in the Last Year: Not on file  . Ran Out of Food in the Last Year: Not on file  Transportation Needs:   . Lack of Transportation (Medical): Not on file  . Lack of Transportation (Non-Medical): Not on file  Physical Activity:   . Days of Exercise per Week: Not on file  . Minutes of Exercise per Session: Not on file  Stress:   .  Feeling of Stress : Not on file  Social Connections:   . Frequency of Communication with Friends and Family: Not on file  . Frequency of Social Gatherings with Friends and Family: Not on file  . Attends Religious Services: Not on file  . Active Member of Clubs or Organizations: Not on file  . Attends Archivist Meetings: Not on file  . Marital Status: Not on file   Additional Social History:   Sleep: Fair  Appetite:  Good  Current Medications: Current Facility-Administered Medications  Medication Dose Route Frequency Provider Last Rate Last Admin  . acetaminophen (TYLENOL) tablet 650 mg  650 mg Oral Q4H PRN Suella Broad, FNP      . alum & mag hydroxide-simeth (MAALOX/MYLANTA) 200-200-20 MG/5ML suspension 30 mL  30 mL Oral Q4H PRN Burt Ek, Gayland Curry, FNP      .  busPIRone (BUSPAR) tablet 10 mg  10 mg Oral BID Sharma Covert, MD   10 mg at 01/11/20 0813  . docusate sodium (COLACE) capsule 100 mg  100 mg Oral BID PRN Sharma Covert, MD      . feeding supplement (BOOST / RESOURCE BREEZE) liquid 1 Container  1 Container Oral BID BM Sharma Covert, MD   1 Container at 01/09/20 [phone]  . lamoTRIgine (LAMICTAL) tablet 200 mg  200 mg Oral Daily Sharma Covert, MD   200 mg at 01/11/20 0813  . lamoTRIgine (LAMICTAL) tablet 250 mg  250 mg Oral QHS Sharma Covert, MD   250 mg at 01/10/20 2117  . LORazepam (ATIVAN) tablet 1 mg  1 mg Oral Q6H PRN Sharma Covert, MD      . magnesium hydroxide (MILK OF MAGNESIA) suspension 30 mL  30 mL Oral Daily PRN Starkes-Perry, Gayland Curry, FNP      . nicotine (NICODERM CQ - dosed in mg/24 hours) patch 21 mg  21 mg Transdermal Daily Suella Broad, FNP   21 mg at 01/11/20 0813  . OLANZapine (ZYPREXA) tablet 5 mg  5 mg Oral QHS Sharma Covert, MD   5 mg at 01/10/20 2117  . sertraline (ZOLOFT) tablet 50 mg  50 mg Oral Daily Sharma Covert, MD   50 mg at 01/11/20 7124    Lab Results:  No results found for this  or any previous visit (from the past 48 hour(s)).  Blood Alcohol level:  Lab Results  Component Value Date   ETH 189 (H) 01/03/2020   ETH <10 58/11/9831    Metabolic Disorder Labs: No results found for: HGBA1C, MPG No results found for: PROLACTIN Lab Results  Component Value Date   TRIG 49 07/03/2019    Physical Findings: AIMS: Facial and Oral Movements Muscles of Facial Expression: None, normal Lips and Perioral Area: None, normal Jaw: None, normal Tongue: None, normal,Extremity Movements Upper (arms, wrists, hands, fingers): None, normal Lower (legs, knees, ankles, toes): None, normal, Trunk Movements Neck, shoulders, hips: None, normal, Overall Severity Severity of abnormal movements (highest score from questions above): None, normal Incapacitation due to abnormal movements: None, normal Patient's awareness of abnormal movements (rate only patient's report): No Awareness, Dental Status Current problems with teeth and/or dentures?: No Does patient usually wear dentures?: No  CIWA:    COWS:     Musculoskeletal: Strength & Muscle Tone: within normal limits Gait & Station: ataxic Patient leans: N/A  Psychiatric Specialty Exam: Physical Exam Vitals and nursing note reviewed.  HENT:     Head: Normocephalic and atraumatic.  Pulmonary:     Effort: Pulmonary effort is normal.  Neurological:     General: No focal deficit present.     Mental Status: He is alert and oriented to person, place, and time.     Review of Systems  Blood pressure 127/90, pulse 91, temperature 98.6 F (37 C), temperature source Oral, resp. rate 18, height 5\' 7"  (1.702 m), weight 64.2 kg, SpO2 96 %.Body mass index is 22.17 kg/m.  General Appearance: Casual  Eye Contact:  Good  Speech:  Normal Rate  Volume:  Normal  Mood:  Euthymic  Affect:  Full Range  Thought Process:  Goal Directed and Descriptions of Associations: Intact  Orientation:  Full (Time, Place, and Person)  Thought Content:   Logical  Suicidal Thoughts:  No  Homicidal Thoughts:  No  Memory:  Immediate;   Good Recent;  Good Remote;   Good  Judgement:  Intact  Insight:  Fair and Present  Psychomotor Activity:  Normal  Concentration:  Concentration: Fair and Attention Span: Fair  Recall:  Good  Fund of Knowledge:  Good  Language:  Good  Akathisia:  Negative  Handed:  Right  AIMS (if indicated):     Assets:  Desire for Improvement Housing Resilience Social Support  ADL's:  Intact  Cognition:  Impaired,  Mild  Sleep:  Number of Hours: 6.25     Treatment Plan Summary: Daily contact with patient to assess and evaluate symptoms and progress in treatment, Medication management and Plan : Patient is seen and examined.  Patient is a 24 year old male with the above-stated past psychiatric history who is seen in follow-up.   Diagnosis: 1.  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  Possible major depressive disorder 3.  Generalized anxiety disorder 4.  Alcohol use disorder 5.  Cocaine use disorder 6.  Previous oligodendroglioma  Pertinent findings on examination today: 1.  Patient denies auditory hallucinations and paranoia.  2.  No gross alcohol withdrawal symptoms. 3.  Denies suicidal ideation. 4.  Patient's mood seems euthymic. Plan: 1.  Continue BuSpar to 10 mg p.o. twice daily for generalized anxiety disorder. 2.  Continue Colace 100 mg p.o. twice daily to change to as needed. 3.  Continue Lamictal 200 mg p.o. daily and 250 mg p.o. nightly for mood stability and seizure disorder. 4.  Continue lorazepam 1 mg p.o. every 6 hours as needed a CIWA greater than 10. 5.  Continue Zyprexa 5 mg p.o. nightly for psychosis. 6.  Continue Zoloft to 50 mg p.o. daily for depression. 7.  Disposition planning-in progress.  Patient reporting feeling better and ready for discharge home.  No change in treatment at this time; will discuss with Dr. Leverne Humbles and treatment team possible discharge  date.    Leotta Weingarten, NP 01/11/2020, 9:58 AM

## 2020-01-11 NOTE — Progress Notes (Signed)
D- Patient alert and oriented. Affect/mood is good.  Denies SI, HI, AVH, and pain.    A- Scheduled medications administered to patient, per MD orders. Support and encouragement provided.  Routine safety checks conducted every 15 minutes.  Patient informed to notify staff with problems or concerns.  R- No adverse drug reactions noted. Patient contracts for safety at this time. Patient compliant with medications and treatment plan. Patient receptive, calm, and cooperative. Patient interacts well with others on the unit.  Patient remains safe at this time.            [place] NOVEL CORONAVIRUS (COVID-19) DAILY CHECK-OFF SYMPTOMS - answer yes or no to each - every day NO YES  Have you had a fever in the past 24 hours?   Fever (Temp > 37.80C / 100F) X    Have you had any of these symptoms in the past 24 hours?  New Cough   Sore Throat    Shortness of Breath   Difficulty Breathing   Unexplained Body Aches   X    Have you had any one of these symptoms in the past 24 hours not related to allergies?    Runny Nose   Nasal Congestion   Sneezing   X    If you have had runny nose, nasal congestion, sneezing in the past 24 hours, has it worsened?   X    EXPOSURES - check yes or no X    Have you traveled outside the state in the past 14 days?   X    Have you been in contact with someone with a confirmed diagnosis of COVID-19 or PUI in the past 14 days without wearing appropriate PPE?   X    Have you been living in the same home as a person with confirmed diagnosis of COVID-19 or a PUI (household contact)?     X    Have you been diagnosed with COVID-19?     X                                                                                                                             What to do next: Answered NO to all: Answered YES to anything:    Proceed with unit schedule Follow the [facility] Inpatient Flowsheet.

## 2020-01-11 NOTE — [facility] Group Notes (Signed)
Psychoeducational Group Note  Date: 01-11-20 Time:  4830-7354  Group Topic/Focus:  Identifying Needs:   The focus of this group is to help patients identify their personal needs that have been historically problematic and identify healthy behaviors to address their needs.  Participation Level:  {Did not attend [place] [place]

## 2020-01-12 DIAGNOSIS — F1010 Alcohol abuse, uncomplicated: Secondary | ICD-10-CM

## 2020-01-12 DIAGNOSIS — T50912A Poisoning by multiple unspecified drugs, medicaments and biological substances, intentional self-harm, initial encounter: Secondary | ICD-10-CM

## 2020-01-12 DIAGNOSIS — F411 Generalized anxiety disorder: Secondary | ICD-10-CM

## 2020-01-12 DIAGNOSIS — Z720 Tobacco use: Secondary | ICD-10-CM

## 2020-01-12 DIAGNOSIS — R569 Unspecified convulsions: Secondary | ICD-10-CM

## 2020-01-12 DIAGNOSIS — R440 Auditory hallucinations: Secondary | ICD-10-CM

## 2020-01-12 MED ORDER — SERTRALINE HCL 50 MG PO TABS: 50.0000 mg | ORAL_TABLET | Freq: Every day | ORAL | 0 refills | Status: DC

## 2020-01-12 MED ORDER — OLANZAPINE 5 MG PO TABS: 5.0000 mg | ORAL_TABLET | Freq: Every day | ORAL | 0 refills | Status: DC

## 2020-01-12 MED ORDER — BUSPIRONE HCL 10 MG PO TABS
10.0000 mg | ORAL_TABLET | Freq: Two times a day (BID) | ORAL | 0 refills | Status: DC
Start: 2020-01-12 — End: 2020-02-12

## 2020-01-12 MED ORDER — LAMOTRIGINE 25 MG PO TABS: 250.0000 mg | ORAL_TABLET | Freq: Every day | ORAL | 0 refills | Status: DC

## 2020-01-12 MED ORDER — LAMOTRIGINE 200 MG PO TABS: 200.0000 mg | ORAL_TABLET | Freq: Every day | ORAL | 0 refills | Status: DC

## 2020-01-12 MED ORDER — NICOTINE 21 MG/24HR TD PT24: 21.0000 mg | MEDICATED_PATCH | Freq: Every day | TRANSDERMAL | 0 refills | Status: DC

## 2020-01-12 NOTE — Progress Notes (Signed)
Pt discharged to lobby- mother present to take pt home. Pt was stable and appreciative at that time. All papers and prescriptions were given and valuables returned. Verbal understanding expressed. Denies SI/HI and A/VH. Pt given opportunity to express concerns and ask questions.

## 2020-01-12 NOTE — [facility] Suicide Risk Assessment (Signed)
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 rate 18, height 5\' 7"  (1.702 m), weight 64.2 kg, SpO2 97 %.Body mass index is 22.17 kg/m.  General Appearance: Casual  Eye Contact::  Good  Speech:  Clear and Coherent  Volume:  Normal  Mood:  Euthymic  Affect:  Appropriate  Thought Process:  Coherent  Orientation:  Full (Time, Place, and Person)  Thought Content:  Logical  Suicidal Thoughts:  No  Homicidal Thoughts:  No  Memory:  Immediate;   Good  Judgement:  Good  Insight:  Fair  Psychomotor Activity:  Normal  Concentration:  Good  Recall:  Good  Fund of Knowledge:Good  Language: Good  Akathisia:  No  Handed:  Right  AIMS (if indicated):     Assets:  Communication Skills Desire for Improvement Leisure Time Resilience Social Support  Sleep:  Number of Hours: 7  Cognition: WNL  ADL's:  Intact   Mental Status Per Nursing Assessment::   On Admission:  Suicidal ideation indicated by patient  Demographic Factors:  Male and Adolescent or young adult  Loss Factors: Decline in physical health  Historical Factors: Impulsivity  Risk Reduction Factors:   Sense of responsibility to family, Religious beliefs about death, Positive social support and Positive therapeutic relationship  Continued Clinical Symptoms:  Medical Diagnoses and Treatments/Surgeries  Cognitive Features That  Contribute To Risk:  None    Suicide Risk:  Minimal: No identifiable suicidal ideation.  Patients presenting with no risk factors but with morbid ruminations; may be classified as minimal risk based on the severity of the depressive symptoms   Follow-up [place]. Go on 01/23/2020.   Specialty: Behavioral Health Why: You have a walk in appointment on 01/23/20 at 9:00 am for therapy services, in person.  You also have an appointment for medication management 02/12/20 at 1:00 pm (Virtual only). Contact information: [place] [phone]              Plan Of Care/Follow-up recommendations:  Other:  Follow-up with outpatient care  Dixie Dials, MD 01/12/2020, 10:18 AM

## 2020-01-12 NOTE — Progress Notes (Signed)
[place] NOVEL CORONAVIRUS (COVID-19) DAILY CHECK-OFF SYMPTOMS - answer yes or no to each - every day NO YES  Have you had a fever in the past 24 hours?  . Fever (Temp > 37.80C / 100F) X   Have you had any of these symptoms in the past 24 hours? . New Cough .  Sore Throat  .  Shortness of Breath .  Difficulty Breathing .  Unexplained Body Aches   X   Have you had any one of these symptoms in the past 24 hours not related to allergies?   . Runny Nose .  Nasal Congestion .  Sneezing   X   If you have had runny nose, nasal congestion, sneezing in the past 24 hours, has it worsened?  X   EXPOSURES - check yes or no X   Have you traveled outside the state in the past 14 days?  X   Have you been in contact with someone with a confirmed diagnosis of COVID-19 or PUI in the past 14 days without wearing appropriate PPE?  X   Have you been living in the same home as a person with confirmed diagnosis of COVID-19 or a PUI (household contact)?    X   Have you been diagnosed with COVID-19?    X              What to do next: Answered NO to all: Answered YES to anything:   Proceed with unit schedule Follow the [facility] Inpatient Flowsheet.   

## 2020-01-12 NOTE — Discharge Summary (Signed)
Physician Discharge Summary Note  Patient:  Stephen Beard is an 24 y.o., male MRN:  [id number] DOB:  03-31-1995 Patient phone:  [phone] (home)  Patient address:   [address] [place] [address],  Total Time spent with patient: 30 minutes  Date of Admission:  01/07/2020 Date of Discharge: 01/12/2020  Reason for Admission:  Patient is a 24 year old male with a past medical history significant for recent neurosurgery for oligodendroglioma and a past psychiatric history for psychotic symptoms for at least 2 to 4 years, and prior to that substance abuse involving methamphetamines and cocaine. Most recently the patient presented to the [facility] emergency department on 10/22 after an intentional overdose of Seroquel with vodka. He was brought to the emergency department by emergency medical services. The patient stated that he had been hearing a voice in his head for several years, and was unable to cope with it any longer and decided to overdose. The patient stated that he had had the psychotic symptoms for quite a while. The patient is not a great historian, and it may be related to this most recent brain tumor and neurosurgery. He was admitted for stabilization and further evaluation.   Principal Problem: Mood disorder with psychosis [facility]) Discharge Diagnoses: Principal Problem:   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[place]) Active Problems:   Focal seizures ([place])   Generalized anxiety disorder   Drug overdose, multiple drugs, intentional self-harm, initial encounter ([place])   Alcohol abuse   Past Psychiatric History: His history is somewhat limited.  It sounds like the psychotic symptoms have been present for several years.  He is also abused substances in the past including methamphetamines and cocaine.  Depending on which source you read from the psychotic symptoms were present prior to the diagnosis of the brain tumor.  It is unclear whether or not the brain tumor was  most likely present at the time when the symptoms occurred.  He has been referred to psychiatry in the past, but it does not appear as though he ever followed up.  His last exposure to psychiatry was at the behavioral health urgent care center on 10/21 or 2 days prior to his overdose.  Past Medical History:  Past Medical History:  Diagnosis Date  . ADD (attention deficit disorder)   . ADHD   . 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 Headache   . Psychotic disorder ([place])   . Seizures ([place])     Past Surgical History:  Procedure Laterality Date  . APPLICATION OF CRANIAL NAVIGATION N/A 07/02/2019   Procedure: APPLICATION OF CRANIAL NAVIGATION;  Surgeon: Judith Part, MD;  Location: [place];  Service: Neurosurgery;  Laterality: N/A;  . CRANIOTOMY N/A 07/03/2019   Procedure: CRANIOTOMY HEMATOMA EVACUATION SUBDURAL;  Surgeon: Judith Part, MD;  Location: [place];  Service: Neurosurgery;  Laterality: N/A;  . CRANIOTOMY Right 07/03/2019   Procedure: CRANIOTOMY HEMATOMA EVACUATION SUBDURAL;  Surgeon: Judith Part, MD;  Location: [place];  Service: Neurosurgery;  Laterality: Right;  . CRANIOTOMY Right 07/02/2019   Procedure: CRANIOTOMY TUMOR EXCISION with Lucky Rathke;  Surgeon: Judith Part, MD;  Location: [place];  Service: Neurosurgery;  Laterality: Right;  posterior   Family History: History reviewed. No pertinent family history. Family Psychiatric  History: His mother stated that the patient's grandmother had used Xanax, and apparently was depressed near the end of her life.  She suffered from chronic obstructive pulmonary disease. Social History:  Social History   Substance and Sexual  Activity  Alcohol Use Yes   Comment: last drink: "3 nights ago" when he had "1/4th of a bottle," has been drinking daily for several weeks from a "few beers to a bottle of liquor"     Social History   Substance and Sexual Activity  Drug Use Yes  . Types: Cocaine   Comment: last use "couple of  weeks ago" when he had "2 lines,"  frequency "once in a blue moon"    Social History   Socioeconomic History  . Marital status: Single    Spouse name: Not on file  . Number of children: Not on file  . Years of education: Not on file  . Highest education level: Not on file  Occupational History  . Not on file  Tobacco Use  . Smoking status: Current Every Day Smoker    Packs/day: 1.00    Years: 12.00    Pack years: 12.00  . Smokeless tobacco: Current User    Types: Chew  Vaping Use  . Vaping Use: Never used  Substance and Sexual Activity  . Alcohol use: Yes    Comment: last drink: "3 nights ago" when he had "1/4th of a bottle," has been drinking daily for several weeks from a "few beers to a bottle of liquor"  . Drug use: Yes    Types: Cocaine    Comment: last use "couple of weeks ago" when he had "2 lines,"  frequency "once in a blue moon"  . Sexual activity: Not Currently  Other Topics Concern  . Not on file  Social History Narrative  . Not on file   Social Determinants of Health   Financial Resource Strain:   . Difficulty of Paying Living Expenses: Not on file  Food Insecurity:   . Worried About Charity fundraiser in the Last Year: Not on file  . Ran Out of Food in the Last Year: Not on file  Transportation Needs:   . Lack of Transportation (Medical): Not on file  . Lack of Transportation (Non-Medical): Not on file  Physical Activity:   . Days of Exercise per Week: Not on file  . Minutes of Exercise per Session: Not on file  Stress:   . Feeling of Stress : Not on file  Social Connections:   . Frequency of Communication with Friends and Family: Not on file  . Frequency of Social Gatherings with Friends and Family: Not on file  . Attends Religious Services: Not on file  . Active Member of Clubs or Organizations: Not on file  . Attends Archivist Meetings: Not on file  . Marital Status: Not on file    Hospital Course:  Patient was started on Zyprexa  2.5 mg daily and 10 mg nightly for his psychosis. Zoloft 25 mg was started for depressed mood and Buspar for continued for anxiety. Wellbutrin was stopped for increased seizure risk and Seroquel was stopped as well due to in- effectiveness. Lamictal was added for mood stabilization 200 mg Q day and 250 mg QHS.  Zoloft was increased to 50 mg for depressed mood. Zoloft was 5 mg nightly.  On the day of discharge patient denies suicidal ideations, homicidal ideations, auditory or visual hallucinations. He had questions about his medication changes and he was provided with all the necessary details and he feels better about his management plan.  He is feeling better, returning to his home in good spirits.   Physical Findings: AIMS: Facial and Oral Movements Muscles of Facial  Expression: None, normal Lips and Perioral Area: None, normal Jaw: None, normal Tongue: None, normal,Extremity Movements Upper (arms, wrists, hands, fingers): None, normal Lower (legs, knees, ankles, toes): None, normal, Trunk Movements Neck, shoulders, hips: None, normal, Overall Severity Severity of abnormal movements (highest score from questions above): None, normal Incapacitation due to abnormal movements: None, normal Patient's awareness of abnormal movements (rate only patient's report): No Awareness, Dental Status Current problems with teeth and/or dentures?: No Does patient usually wear dentures?: No  CIWA:    COWS:     Musculoskeletal: Strength & Muscle Tone: within normal limits Gait & Station: normal Patient leans: N/A  Psychiatric Specialty Exam: Physical Exam Vitals and nursing note reviewed.  Constitutional:      Appearance: Normal appearance.  HENT:     Head: Normocephalic and atraumatic.  Eyes:     Pupils: Pupils are equal, round, and reactive to light.  Musculoskeletal:        General: Normal range of motion.  Neurological:     General: No focal deficit present.     Mental Status: He is alert  and oriented to person, place, and time.     Review of Systems  Constitutional: Negative.   HENT: Negative.   Respiratory: Negative.   Genitourinary: Negative.   Neurological: Negative.   Psychiatric/Behavioral: Negative.     Blood pressure 116/87, pulse 93, temperature 98.5 F (36.9 C), temperature source Oral, resp. rate 18, height 5\' 7"  (1.702 m), weight 64.2 kg, SpO2 97 %.Body mass index is 22.17 kg/m.  General Appearance: Casual  Eye Contact:  Good  Speech:  Normal Rate  Volume:  Normal  Mood:  Euthymic  Affect:  Appropriate  Thought Process:  Linear and Descriptions of Associations: Intact  Orientation:  Full (Time, Place, and Person)  Thought Content:  Logical  Suicidal Thoughts:  No  Homicidal Thoughts:  No  Memory:  Immediate;   Good Recent;   Good Remote;   Good  Judgement:  Intact  Insight:  Fair  Psychomotor Activity:  Normal  Concentration:  Concentration: Good and Attention Span: Good  Recall:  Good  Fund of Knowledge:  Good  Language:  Good  Akathisia:  Negative  Handed:  Right  AIMS (if indicated):     Assets:  Desire for Improvement Housing Resilience Social Support Transportation  ADL's:  Intact  Cognition:  WNL  Sleep:  Number of Hours: 7        Has this patient used any form of tobacco in the last 30 days? (Cigarettes, Smokeless Tobacco, Cigars, and/or Pipes) Yes, N/A  Blood Alcohol level:  Lab Results  Component Value Date   ETH 189 (H) 01/03/2020   ETH <10 49/70/2637    Metabolic Disorder Labs:  No results found for: HGBA1C, MPG No results found for: PROLACTIN Lab Results  Component Value Date   TRIG 49 07/03/2019    See Psychiatric Specialty Exam and Suicide Risk Assessment completed by Attending Physician prior to discharge.  Discharge destination:  Home  Is patient on multiple antipsychotic therapies at discharge:  No   Has Patient had three or more failed trials of antipsychotic monotherapy by history:  Yes,    Antipsychotic medications that previously failed include:   1.  Seroquel.  Recommended Plan for Multiple Antipsychotic Therapies: NA  Discharge Instructions    Discharge patient   Complete by: As directed    Discharge disposition: 01-Home or Self Care   Discharge patient date: 01/12/2020     Allergies as  of 01/12/2020   No Known Allergies     Medication List    STOP taking these medications   buPROPion 150 MG 24 hr tablet Commonly known as: Wellbutrin XL   QUEtiapine 100 MG tablet Commonly known as: SEROquel     TAKE these medications     Indication  busPIRone 10 MG tablet Commonly known as: BUSPAR Take 1 tablet (10 mg total) by mouth 2 (two) times daily. What changed: when to take this  Indication: Anxiety Disorder   lamoTRIgine 25 MG tablet Commonly known as: LAMICTAL Take 10 tablets (250 mg total) by mouth at bedtime. What changed:   medication strength  how much to take  when to take this  Indication: Manic-Depression   lamoTRIgine 200 MG tablet Commonly known as: LAMICTAL Take 1 tablet (200 mg total) by mouth daily. Start taking on: January 13, 2020 What changed:   medication strength  how much to take  when to take this  Indication: Manic-Depression   nicotine 21 mg/24hr patch Commonly known as: NICODERM CQ - dosed in mg/24 hours Place 1 patch (21 mg total) onto the skin daily. Start taking on: January 13, 2020  Indication: Nicotine Addiction   OLANZapine 5 MG tablet Commonly known as: ZYPREXA Take 1 tablet (5 mg total) by mouth at bedtime.  Indication: Depressive Phase of Manic-Depression   sertraline 50 MG tablet Commonly known as: ZOLOFT Take 1 tablet (50 mg total) by mouth daily. Start taking on: January 13, 2020  Indication: Major Depressive Disorder   temozolomide 100 MG capsule Commonly known as: TEMODAR Take 200 mg by mouth at bedtime. X 5 days  Total dose 340 mg x 5 days then off repeat each cycle May take on an empty  stomach or at bedtime to decrease nausea & vomiting. What changed: Another medication with the same name was removed. Continue taking this medication, and follow the directions you see here.  Indication: Tumor of Cells That Provide Insulation to [place]. Go on 01/23/2020.   Specialty: Behavioral Health Why: You have a walk in appointment on 01/23/20 at 9:00 am for therapy services, in person.  You also have an appointment for medication management 02/12/20 at 1:00 pm (Virtual only). Contact information: [place] [phone]              Follow-up recommendations:  Activity:  Normal Diet:  Normal  Comments:  Prescriptions given at discharge.Patient agreeable to plan. Given opportunity to ask questions. Appears to feel comfortable with discharge denies any current suicidal or homicidal thought. Patient is also instructed prior to discharge to: Take all medications as prescribed by his mental healthcare provider. Report any adverse effects and or reactions from the medicines to his outpatient provider promptly. Patient has been instructed & cautioned: To not engage in alcohol and or illegal drug use while on prescription medicines. In the event of worsening symptoms, patient is instructed to call the crisis hotline, 911 and or go to the nearest ED for appropriate evaluation and treatment of symptoms. To follow-up with his primary care provider for your other medical issues, concerns and or health care needs.  Signed: Honor Junes, MD 01/12/2020, 10:13 AM

## 2020-01-12 NOTE — Progress Notes (Addendum)
   01/12/20 0900  Psych Admission Type (Psych Patients Only)  Admission Status Involuntary  Psychosocial Assessment  Patient Complaints None  Eye Contact Fair  Facial Expression Other (Comment) (appropriate)  Affect Appropriate to circumstance  Speech Logical/coherent  Interaction Assertive  Motor Activity Slow  Appearance/Hygiene Unremarkable  Behavior Characteristics Cooperative;Calm  Mood Pleasant  Thought Process  Coherency WDL  Content WDL  Delusions None reported or observed  Perception WDL  Hallucination None reported or observed  Judgment WDL  Confusion None  Danger to Self  Current suicidal ideation? Denies  Danger to Others  Danger to Others None reported or observed    D. Pt presents as friendly- appropriate during interactions- observed attending groups in the dayroom. Per pt's self inventory, pt rated his depression, hopelessness and anxiety a 0/0/1, respectively. Pt reports that his goal today is "maintaining and staying positive".   Pt currently denies SI/HI and AVH  A. Labs and vitals monitored. Pt compliant with medications. Pt supported emotionally and encouraged to express concerns and ask questions.   R. Pt remains safe with 15 minute checks. Will continue POC.

## 2020-01-12 NOTE — Progress Notes (Signed)
  [facility] Adult Case Management Discharge Plan :  Will you be returning to the same living situation after discharge:  Yes,  mother's home At discharge, do you have transportation home?: Yes,  via friend Do you have the ability to pay for your medications: Yes,  has mcd  Release of information consent forms completed and in the chart;  Patient's signature needed at discharge.  Patient to Follow up at:  [facility]. Go on 01/23/2020.   Specialty: Behavioral Health Why: You have a walk in appointment on 01/23/20 at 9:00 am for therapy services, in person.  You also have an appointment for medication management 02/12/20 at 1:00 pm (Virtual only). Contact information: [place] [phone]              Next level of care provider has access to [place] and Suicide Prevention discussed: Yes,  with patient     Has patient been referred to the Quitline?: Patient refused referral  Patient has been referred for addiction treatment: Pt. refused referral  Eliott Nine 01/12/2020, 9:58 AM

## 2020-01-13 ENCOUNTER — Telehealth: Payer: Self-pay | Admitting: Internal Medicine

## 2020-01-13 NOTE — Telephone Encounter (Signed)
Scheduled appt per 11/1 sch msg - left message for patient with appt date and time

## 2020-01-15 ENCOUNTER — Telehealth: Payer: Self-pay | Admitting: *Deleted

## 2020-01-15 NOTE — Telephone Encounter (Signed)
Received call from pt's mother, [name], inquiring about pt's medications - specifically the Lamictal. Pt is on 200mg  in the morning and 250 mg at night. [name] states she has 2 bottles of medications. 1 bottle is 200mg  tablets and the 2nd bottle is 25 mg tablets.  Apparently the pharmacist told her to give night time dose all in 25 mg tablets (10 tablets).  Sonnie Alamo states that if she did it this way, she would run out of those tablets.  The 200mg  tablet  Prescription has 60 tablets. Instructed Tonya to give pt one 200 mg tablet in the morning and then at night to give one 200mg  tablet plus two of the 25 mg tablets to equal the 250mg  dose needed at night. Reviewed this several times with her. She voiced understanding and said that made much more sense.

## 2020-01-20 ENCOUNTER — Inpatient Hospital Stay: Payer: Medicaid Other

## 2020-01-20 ENCOUNTER — Other Ambulatory Visit: Payer: Self-pay | Admitting: Internal Medicine

## 2020-01-20 ENCOUNTER — Other Ambulatory Visit: Payer: Self-pay

## 2020-01-20 ENCOUNTER — Inpatient Hospital Stay: Payer: Medicaid Other | Attending: Internal Medicine | Admitting: Internal Medicine

## 2020-01-20 VITALS — BP 137/84 | HR 72 | Temp 98.6°F | Resp 18 | Ht 67.0 in | Wt 150.1 lb

## 2020-01-20 DIAGNOSIS — R440 Auditory hallucinations: Secondary | ICD-10-CM | POA: Insufficient documentation

## 2020-01-20 DIAGNOSIS — C714 Malignant neoplasm of occipital lobe: Secondary | ICD-10-CM | POA: Insufficient documentation

## 2020-01-20 DIAGNOSIS — R569 Unspecified convulsions: Secondary | ICD-10-CM

## 2020-01-20 LAB — CBC WITH DIFFERENTIAL (CANCER CENTER ONLY)
Abs Immature Granulocytes: 0.03 10*3/uL (ref 0.00–0.07)
Basophils Absolute: 0 10*3/uL (ref 0.0–0.1)
Basophils Relative: 0 %
Eosinophils Absolute: 0.1 10*3/uL (ref 0.0–0.5)
Eosinophils Relative: 1 %
HCT: 45 % (ref 39.0–52.0)
Hemoglobin: 15.9 g/dL (ref 13.0–17.0)
Immature Granulocytes: 0 %
Lymphocytes Relative: 13 %
Lymphs Abs: 1.2 10*3/uL (ref 0.7–4.0)
MCH: 32.3 pg (ref 26.0–34.0)
MCHC: 35.3 g/dL (ref 30.0–36.0)
MCV: 91.5 fL (ref 80.0–100.0)
Monocytes Absolute: 0.6 10*3/uL (ref 0.1–1.0)
Monocytes Relative: 6 %
Neutro Abs: 7.4 10*3/uL (ref 1.7–7.7)
Neutrophils Relative %: 80 %
Platelet Count: 216 10*3/uL (ref 150–400)
RBC: 4.92 MIL/uL (ref 4.22–5.81)
RDW: 12.5 % (ref 11.5–15.5)
WBC Count: 9.3 10*3/uL (ref 4.0–10.5)
nRBC: 0 % (ref 0.0–0.2)

## 2020-01-20 LAB — CMP (CANCER CENTER ONLY)
ALT: 74 U/L — ABNORMAL HIGH (ref 0–44)
AST: 44 U/L — ABNORMAL HIGH (ref 15–41)
Albumin: 4.3 g/dL (ref 3.5–5.0)
Alkaline Phosphatase: 80 U/L (ref 38–126)
Anion gap: 8 (ref 5–15)
BUN: 11 mg/dL (ref 6–20)
CO2: 26 mmol/L (ref 22–32)
Calcium: 9.2 mg/dL (ref 8.9–10.3)
Chloride: 106 mmol/L (ref 98–111)
Creatinine: 1 mg/dL (ref 0.61–1.24)
GFR, Estimated: >60 mL/min (ref >60)
Glucose, Bld: 101 mg/dL — ABNORMAL HIGH (ref 70–99)
Potassium: 4.3 mmol/L (ref 3.5–5.1)
Sodium: 140 mmol/L (ref 135–145)
Total Bilirubin: 0.5 mg/dL (ref 0.3–1.2)
Total Protein: 7.2 g/dL (ref 6.5–8.1)

## 2020-01-20 MED ORDER — OLANZAPINE 5 MG PO TABS: 5.0000 mg | ORAL_TABLET | Freq: Every day | ORAL | 3 refills | Status: DC

## 2020-01-20 MED ORDER — ONDANSETRON HCL 8 MG PO TABS: 8.0000 mg | ORAL_TABLET | Freq: Two times a day (BID) | ORAL | 1 refills | Status: DC | PRN

## 2020-01-20 MED ORDER — TEMOZOLOMIDE 140 MG PO CAPS: 140.0000 mg | ORAL_CAPSULE | Freq: Every day | ORAL | 0 refills | Status: DC

## 2020-01-20 MED ORDER — LAMOTRIGINE 25 MG PO TABS: 250.0000 mg | ORAL_TABLET | Freq: Every day | ORAL | 3 refills | Status: DC

## 2020-01-20 MED ORDER — TEMOZOLOMIDE 100 MG PO CAPS: 200.0000 mg | ORAL_CAPSULE | Freq: Every day | ORAL | 0 refills | Status: DC

## 2020-01-20 MED FILL — TEMOZOLOMIDE 140 MG CAPS: 140 | 28 days supply | Qty: 5 | Fill #0

## 2020-01-20 MED FILL — TEMOZOLOMIDE 100 MG CAPS: 100 | 28 days supply | Qty: 10 | Fill #0

## 2020-01-20 MED FILL — ONDANSETRON HCL 8 MG TABLET: 8 | 15 days supply | Qty: 30 | Fill #0

## 2020-01-20 NOTE — Progress Notes (Signed)
[facility] [place], [place] [address] [phone]   Interval Evaluation  Date of Service: 01/20/20 Patient Name: Stephen Beard Patient MRN: [id number] Patient DOB: [birth year] Provider: [name], MD  Identifying Statement:  Stephen Beard is a 24 y.o. 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 Zada Finders. Followed by hematoma evacuation on 07/03/19.   08/05/2019 - 09/17/2019 Radiation Therapy   IMRT with concurrent Temodar 26m/m2 daily   10/15/2019 -  Chemotherapy   The patient had temozolomide (TEMODAR) 100 MG capsule, 200 mg (100 % of original dose 200 mg), Oral, Daily, 1 of 1 cycle, Start date: 12/05/2019, End date: 12/10/2019 Dose modification: 200 mg (original dose 200 mg, Cycle 1) temozolomide (TEMODAR) 140 MG capsule, 150 mg/m2/day = 280 mg, Oral, Daily, 1 of 1 cycle, Start date: 10/15/2019, End date: 10/20/2019 Dose modification: 140 mg (original dose 140 mg, Cycle 1)  for chemotherapy treatment.      Biomarkers:  MGMT Unknown.  IDH 1/2 Mutated.  EGFR Unknown  1p/19q co-deleted   Interval History:  BBernice MullinBridgers presents today for follow up after recent mental health hospitalization.  He feels significantly improved since returning home, there is no interest in harm to himself at this time.  Auditory hallucinations have decreased considerably as well.  He feels no new or progressive neurologic deficits.  No seizures since Lamictal was increased to 200/250.  H+P (07/19/19) Patient presented to medical attention with several months of progressive headaches and left sided visual impairment.  This was accompanied by episodes of deja-vu +paroxysmal burning smells which would occur almost daily over that time.  CNS imaging demonstrated large parieto-occipital mass with  herniation syndrome.  Mass was resected/debulked, and post-operative course was complicated by intracranial hematoma and also respiratory failure.  At present, he is walking and talking, carries left sided visual impairment.  Currently on antibiotics for wound infection per Dr. OZada Finders  Not requiring steroids.  Currently on lamictal 565mdaily.  Medications: Current Outpatient Medications on File Prior to Visit  Medication Sig Dispense Refill  . lamoTRIgine (LAMICTAL) 200 MG tablet Take 1 tablet (200 mg total) by mouth daily. 60 tablet 0  . lamoTRIgine (LAMICTAL) 25 MG tablet Take 10 tablets (250 mg total) by mouth at bedtime. 30 tablet 0  . OLANZapine (ZYPREXA) 5 MG tablet Take 1 tablet (5 mg total) by mouth at bedtime. 30 tablet 0  . sertraline (ZOLOFT) 50 MG tablet Take 1 tablet (50 mg total) by mouth daily. 30 tablet 0  . busPIRone (BUSPAR) 10 MG tablet Take 1 tablet (10 mg total) by mouth 2 (two) times daily. (Patient not taking: Reported on 01/20/2020) 60 tablet 0  . nicotine (NICODERM CQ - DOSED IN MG/24 HOURS) 21 mg/24hr patch Place 1 patch (21 mg total) onto the skin daily. (Patient not taking: Reported on 01/20/2020) 28 patch 0  . temozolomide (TEMODAR) 100 MG capsule Take 200 mg by mouth at bedtime. X 5 days  Total dose 340 mg x 5 days then off repeat each cycle May take on an empty stomach or at bedtime to decrease nausea & vomiting. (Patient not taking: Reported on 01/20/2020)     No current facility-administered medications on file prior to visit.    Allergies: No Known Allergies Past Medical History:  Past Medical History:  Diagnosis Date  .  ADD (attention deficit disorder)   . ADHD   . 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 Headache   . Psychotic disorder ([place])   . Seizures ([place])    Past Surgical History:  Past Surgical History:  Procedure Laterality Date  . APPLICATION OF CRANIAL NAVIGATION N/A 07/02/2019   Procedure: APPLICATION OF CRANIAL NAVIGATION;  Surgeon:  Judith Part, MD;  Location: [facility];  Service: Neurosurgery;  Laterality: N/A;  . CRANIOTOMY N/A 07/03/2019   Procedure: CRANIOTOMY HEMATOMA EVACUATION SUBDURAL;  Surgeon: Judith Part, MD;  Location: [facility];  Service: Neurosurgery;  Laterality: N/A;  . CRANIOTOMY Right 07/03/2019   Procedure: CRANIOTOMY HEMATOMA EVACUATION SUBDURAL;  Surgeon: Judith Part, MD;  Location: [facility];  Service: Neurosurgery;  Laterality: Right;  . CRANIOTOMY Right 07/02/2019   Procedure: CRANIOTOMY TUMOR EXCISION with Lucky Rathke;  Surgeon: Judith Part, MD;  Location: [facility];  Service: Neurosurgery;  Laterality: Right;  posterior   Social History:  Social History   Socioeconomic History  . Marital status: Single    Spouse name: Not on file  . Number of children: Not on file  . Years of education: Not on file  . Highest education level: Not on file  Occupational History  . Not on file  Tobacco Use  . Smoking status: Current Every Day Smoker    Packs/day: 1.00    Years: 12.00    Pack years: 12.00  . Smokeless tobacco: Current User    Types: Chew  Vaping Use  . Vaping Use: Never used  Substance and Sexual Activity  . Alcohol use: Yes    Comment: last drink: "3 nights ago" when he had "1/4th of a bottle," has been drinking daily for several weeks from a "few beers to a bottle of liquor"  . Drug use: Yes    Types: Cocaine    Comment: last use "couple of weeks ago" when he had "2 lines,"  frequency "once in a blue moon"  . Sexual activity: Not Currently  Other Topics Concern  . Not on file  Social History Narrative  . Not on file   Social Determinants of Health   Financial Resource Strain:   . Difficulty of Paying Living Expenses: Not on file  Food Insecurity:   . Worried About Charity fundraiser in the Last Year: Not on file  . Ran Out of Food in the Last Year: Not on file  Transportation Needs:   . Lack of Transportation (Medical): Not on file  . Lack of Transportation  (Non-Medical): Not on file  Physical Activity:   . Days of Exercise per Week: Not on file  . Minutes of Exercise per Session: Not on file  Stress:   . Feeling of Stress : Not on file  Social Connections:   . Frequency of Communication with Friends and Family: Not on file  . Frequency of Social Gatherings with Friends and Family: Not on file  . Attends Religious Services: Not on file  . Active Member of Clubs or Organizations: Not on file  . Attends Archivist Meetings: Not on file  . Marital Status: Not on file  Intimate Partner Violence:   . Fear of Current or Ex-Partner: Not on file  . Emotionally Abused: Not on file  . Physically Abused: Not on file  . Sexually Abused: Not on file   Family History: No family history on file.  Review of Systems: Constitutional: Doesn't report fevers, chills or abnormal weight loss Eyes:  Doesn't report blurriness of vision Ears, nose, mouth, throat, and face: Doesn't report sore throat Respiratory: Doesn't report cough, dyspnea or wheezes Cardiovascular: Doesn't report palpitation, chest discomfort  Gastrointestinal:  Doesn't report nausea, constipation, diarrhea GU: Doesn't report incontinence Skin: Doesn't report skin rashes Neurological: Per HPI Musculoskeletal: Doesn't report joint pain Behavioral/Psych: Doesn't report anxiety  Physical Exam: Vitals:   01/20/20 1248  BP: 137/84  Pulse: 72  Resp: 18  Temp: 98.6 F (37 C)  SpO2: 99%   KPS: 80. General: Alert, cooperative, pleasant, in no acute distress Head: Normal EENT: No conjunctival injection or scleral icterus.  Lungs: Resp effort normal Cardiac: Regular rate Abdomen: Non-distended abdomen Skin: No rashes cyanosis or petechiae. Extremities: No clubbing or edema  Neurologic Exam: Mental Status: Awake, alert, attentive to examiner. Oriented to self and environment. Language is fluent with intact comprehension.  Cranial Nerves: Visual acuity is grossly normal.  Left homonymous hemianopia. Extra-ocular movements intact. No ptosis. Face is symmetric Motor: Tone and bulk are normal. Power is full in both arms and legs. Reflexes are symmetric, no pathologic reflexes present.  Sensory: Intact to light touch Gait: Sensory dystaxia   Labs: I have reviewed the data as listed    Component Value Date/Time   NA 140 01/20/2020 1140   K 4.3 01/20/2020 1140   CL 106 01/20/2020 1140   CO2 26 01/20/2020 1140   GLUCOSE 101 (H) 01/20/2020 1140   BUN 11 01/20/2020 1140   CREATININE 1.00 01/20/2020 1140   CALCIUM 9.2 01/20/2020 1140   PROT 7.2 01/20/2020 1140   ALBUMIN 4.3 01/20/2020 1140   AST 44 (H) 01/20/2020 1140   ALT 74 (H) 01/20/2020 1140   ALKPHOS 80 01/20/2020 1140   BILITOT 0.5 01/20/2020 1140   GFRNONAA >60 01/20/2020 1140   GFRAA >60 12/05/2019 1206   Lab Results  Component Value Date   WBC 9.3 01/20/2020   NEUTROABS 7.4 01/20/2020   HGB 15.9 01/20/2020   HCT 45.0 01/20/2020   MCV 91.5 01/20/2020   PLT 216 01/20/2020   Imaging:  [facility] Clinician Interpretation: I have personally reviewed the CNS images as listed.  My interpretation, in the context of the patient's clinical presentation, is stable disease  CT Head Wo Contrast  Result Date: 01/04/2020 CLINICAL DATA:  Seizure and encephalopathy. EXAM: CT HEAD WITHOUT CONTRAST TECHNIQUE: Contiguous axial images were obtained from the base of the skull through the vertex without intravenous contrast. COMPARISON:  07/03/2019 FINDINGS: Brain: Right occipital encephalomalacia. No hemorrhage or extra-axial collection. Ex vacuo dilatation of the right lateral ventricle. Vascular: No abnormal hyperdensity of the major intracranial arteries or dural venous sinuses. No intracranial atherosclerosis. Skull: Right posterior remote craniotomy site. Sinuses/Orbits: No fluid levels or advanced mucosal thickening of the visualized paranasal sinuses. No mastoid or middle ear effusion. The orbits are normal.  IMPRESSION: 1. No acute intracranial abnormality. 2. Right occipital encephalomalacia and remote craniotomy site. Electronically Signed   By: Ulyses Jarred M.D.   On: 01/04/2020 05:51   EEG adult  Result Date: 01/05/2020 Lora Havens, MD     01/05/2020 10:26 AM Patient Name: MAKAIL WATLING MRN: [id number] Epilepsy Attending: Lora Havens Referring Physician/Provider: Dr Karmen Bongo Date: 01/05/2020 Duration: Patient history: 24yo M with R parietal anaplastic oligodendroglioma s/p craniotomy and resection on 07/02/19 and hematoma evacuation on 4/21 presented with seizure. EEG to evaluate for seizure Level of alertness: Awake, asleep AEDs during EEG study: None Technical aspects: This EEG study was done with scalp electrodes  positioned according to the 10-20 International system of electrode placement. Electrical activity was acquired at a sampling rate of 500Hz  and reviewed with a high frequency filter of 70Hz  and a low frequency filter of 1Hz . EEG data were recorded continuously and digitally stored. Description: No  posterior dominant rhythm was seen. Sleep was characterized by vertex waves, sleep spindles (12 to 14 Hz), maximal frontocentral region.  There is an excessive amount of 15 to 18 Hz beta activity distributed symmetrically and diffusely.   Hyperventilation and photic stimulation were not performed.   ABNORMALITY -Excessive beta, generalized IMPRESSION: This study is within normal limits. No seizures or epileptiform discharges were seen throughout the recording.The excessive beta activity seen in the background is most likely due to the effect of benzodiazepine and is a benign EEG pattern. 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  He is stable from mental health standpoint, on regimen from discharge.  He has  follow up arranged with outpatient psychiatry.  We recommended continuing treatment with cycle #3 Temozolomide, 282m/m2, on for five days and off for twenty three days in twenty eight day cycles. The patient will have a complete blood count performed on days 21 and 28 of each cycle, and a comprehensive metabolic panel performed on day 28 of each cycle. Labs may need to be performed more often. Zofran will prescribed for home use for nausea/vomiting.   Chemotherapy should be held for the following:  ANC less than 1,000  Platelets less than 100,000  LFT or creatinine greater than 2x ULN  If clinical concerns/contraindications develop  We recommended continuing Lamictal 200/250.  We ask that BBurr SofferBridgers return to clinic in 1 months prior to cycle #3 with labs for evaluation.  All questions were answered. The patient knows to call the clinic with any problems, questions or concerns. No barriers to learning were detected.  I have spent a total of 30 minutes of face-to-face and non-face-to-face time, excluding clinical staff time, preparing to see patient, ordering tests and/or medications, counseling the patient, and independently interpreting results and communicating results to the patient/family/caregiver    ZVentura Sellers MD Medical Director of [facility]11/08/21 1:12 PM

## 2020-01-21 ENCOUNTER — Telehealth: Payer: Self-pay | Admitting: Internal Medicine

## 2020-01-21 NOTE — Telephone Encounter (Signed)
Scheduled per 11/8 los. Pt is aware of appt times and date.

## 2020-02-10 ENCOUNTER — Telehealth: Payer: Self-pay | Admitting: *Deleted

## 2020-02-10 NOTE — Telephone Encounter (Signed)
Patient called to report having issue with pink eye and was requesting antibiotic for it.  Advised patient would need to seek assistance from PCP.  Patient declined PCP as he stated he would have very costly copay and wouldn't want to pay that.    Advised he could get something OTC from pharmacy to see if that would work.    Stated he would try that.

## 2020-02-12 ENCOUNTER — Encounter (HOSPITAL_COMMUNITY): Payer: Self-pay | Admitting: Psychiatry

## 2020-02-12 ENCOUNTER — Other Ambulatory Visit: Payer: Self-pay

## 2020-02-12 ENCOUNTER — Telehealth (INDEPENDENT_AMBULATORY_CARE_PROVIDER_SITE_OTHER): Payer: Medicaid Other | Admitting: Psychiatry

## 2020-02-12 DIAGNOSIS — F251 Schizoaffective disorder, depressive type: Secondary | ICD-10-CM

## 2020-02-12 DIAGNOSIS — F411 Generalized anxiety disorder: Secondary | ICD-10-CM | POA: Diagnosis not present

## 2020-02-12 MED ORDER — SERTRALINE HCL 50 MG PO TABS: 50.0000 mg | ORAL_TABLET | Freq: Every day | ORAL | 0 refills | Status: DC

## 2020-02-12 MED ORDER — OLANZAPINE 7.5 MG PO TABS: 7.5000 mg | ORAL_TABLET | Freq: Every day | ORAL | 2 refills | Status: DC

## 2020-02-12 MED ORDER — TRAZODONE HCL 50 MG PO TABS: 50.0000 mg | ORAL_TABLET | Freq: Every evening | ORAL | 2 refills | Status: DC | PRN

## 2020-02-12 NOTE — Progress Notes (Signed)
[place] MD/PA/NP OP Progress Note Virtual Visit via Telephone Note  I connected with [name] on 02/12/20 at  1:00 PM EST by telephone and verified that I am speaking with the correct person using two identifiers.  Location: Patient: home Provider: Clinic   I discussed the limitations, risks, security and privacy concerns of performing an evaluation and management service by telephone and the availability of in person appointments. I also discussed with the patient that there may be a patient responsible charge related to this service. The patient expressed understanding and agreed to proceed.   I provided 30 minutes of non-face-to-face time during this encounter.   02/12/2020 1:56 PM Stephen Beard  MRN:  [id number]  Chief Complaint: "I did something really stupid and tried to overdose because I got tired of hearing the voices"  HPI:  24 year old male seen today for follow up psychiatric evaluation.  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  He is currently managed on Zyprexa 5 mg daily, BuSpar 10 mg 2 times daily (informed provider that he is not taking it because it is too sedating), Lamictal 200 mg daily, and Lamictal 250 mg at night.  He noted that his medications are somewhat effective in managing his psychiatric conditions.    Today patient informed provider that he forgot about his appointment and requested that the appointment be done over the phone.  On exam he is pleasant, cooperative, and engaged in conversation.  He informed provider that recently he did something stupid because he was tired to overdose on his pills because he was tired of hearing his voices.  He was admitted at [facility] on 01/07/2020-01/12/2020.  He noted that while at the facility they switched his Seroquel to Zyprexa and he now reported that he hears less voices.  He noted that the voices are just  quieter.  He endorsed symptoms of anxiety and depression.  He informed provider that he is often restless, on edge, and has feelings that something awful might happen (noting that he will have another seizure or be away from his family).  Provider conducted a GAD-7 and patient scored a 18.  Provider also conducted a PHQ-9 and patient scored a 19.  He endorses poor concentration, anhedonia, disrupted sleep (noting that he sleeps 6 or less hours a night and is up several times a night).  Today he denies SI/HI/VH.  Patient informed provider that since his hospitalization his mother has locked his medications away.  Today patient is agreeable to discontinuing BuSpar.  Provider recommended lowering the dose however he notes that he would just like to stop it.  He is also agreeable to increasing the Zyprexa 5mg  to 7.5 mg to help manage symptoms of psychosis and mood.  He is also agreeable to starting trazodone 25 mg -50 mg as needed nightly to help manage sleep.  He will continue all other medications as prescribed.  No other concerns noted at this time. Visit Diagnosis:    ICD-10-CM   1. Schizoaffective disorder, depressive type ([place])  F25.1 OLANZapine (ZYPREXA) 7.5 MG tablet    traZODone (DESYREL) 50 MG tablet  2. 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 ADD (attention deficit disorder)   .  ADHD   . 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 Headache   . Psychotic disorder ([place])   . Seizures ([place])     Past Surgical History:  Procedure Laterality Date  . APPLICATION OF CRANIAL NAVIGATION N/A 07/02/2019   Procedure: APPLICATION OF CRANIAL NAVIGATION;  Surgeon: Judith Part, MD;  Location: [place];  Service: Neurosurgery;  Laterality: N/A;  . CRANIOTOMY N/A  07/03/2019   Procedure: CRANIOTOMY HEMATOMA EVACUATION SUBDURAL;  Surgeon: Judith Part, MD;  Location: [place];  Service: Neurosurgery;  Laterality: N/A;  . CRANIOTOMY Right 07/03/2019   Procedure: CRANIOTOMY HEMATOMA EVACUATION SUBDURAL;  Surgeon: Judith Part, MD;  Location: [place];  Service: Neurosurgery;  Laterality: Right;  . CRANIOTOMY Right 07/02/2019   Procedure: CRANIOTOMY TUMOR EXCISION with Lucky Rathke;  Surgeon: Judith Part, MD;  Location: [place];  Service: Neurosurgery;  Laterality: Right;  posterior    Family Psychiatric History: Denies Family History: History reviewed. No pertinent family history.  Social History:  Social History   Socioeconomic History  . Marital status: Single    Spouse name: Not on file  . Number of children: Not on file  . Years of education: Not on file  . Highest education level: Not on file  Occupational History  . Not on file  Tobacco Use  . Smoking status: Current Every Day Smoker    Packs/day: 1.00    Years: 12.00    Pack years: 12.00  . Smokeless tobacco: Current User    Types: Chew  Vaping Use  . Vaping Use: Never used  Substance and Sexual Activity  . Alcohol use: Yes    Comment: last drink: "3 nights ago" when he had "1/4th of a bottle," has been drinking daily for several weeks from a "few beers to a bottle of liquor"  . Drug use: Yes    Types: Cocaine    Comment: last use "couple of weeks ago" when he had "2 lines,"  frequency "once in a blue moon"  . Sexual activity: Not Currently  Other Topics Concern  . Not on file  Social History Narrative  . Not on file   Social Determinants of Health   Financial Resource Strain:   . Difficulty of Paying Living Expenses: Not on file  Food Insecurity:   . Worried About Charity fundraiser in the Last Year: Not on file  . Ran Out of Food in the Last Year: Not on file  Transportation Needs:   . Lack of Transportation (Medical): Not on file  . Lack of Transportation  (Non-Medical): Not on file  Physical Activity:   . Days of Exercise per Week: Not on file  . Minutes of Exercise per Session: Not on file  Stress:   . Feeling of Stress : Not on file  Social Connections:   . Frequency of Communication with Friends and Family: Not on file  . Frequency of Social Gatherings with Friends and Family: Not on file  . Attends Religious Services: Not on file  . Active Member of Clubs or Organizations: Not on file  . Attends Archivist Meetings: Not on file  . Marital Status: Not on file    Allergies: No Known Allergies  Metabolic Disorder Labs: No results found for: HGBA1C, MPG No results found for: PROLACTIN Lab Results  Component Value Date   TRIG 49 07/03/2019   Lab Results  Component Value Date   TSH 1.410 01/08/2020    Therapeutic Level Labs: No results found  for: LITHIUM No results found for: VALPROATE No components found for:  CBMZ  Current Medications: Current Outpatient Medications  Medication Sig Dispense Refill  . lamoTRIgine (LAMICTAL) 200 MG tablet Take 1 tablet (200 mg total) by mouth daily. 60 tablet 0  . lamoTRIgine (LAMICTAL) 25 MG tablet Take 10 tablets (250 mg total) by mouth at bedtime. 30 tablet 3  . nicotine (NICODERM CQ - DOSED IN MG/24 HOURS) 21 mg/24hr patch Place 1 patch (21 mg total) onto the skin daily. (Patient not taking: Reported on 01/20/2020) 28 patch 0  . OLANZapine (ZYPREXA) 7.5 MG tablet Take 1 tablet (7.5 mg total) by mouth at bedtime. 30 tablet 2  . ondansetron (ZOFRAN) 8 MG tablet Take 1 tablet (8 mg total) by mouth 2 (two) times daily as needed (nausea and vomiting). May take 30-60 minutes prior to Temodar administration if nausea/vomiting occurs. 30 tablet 1  . sertraline (ZOLOFT) 50 MG tablet Take 1 tablet (50 mg total) by mouth daily. 30 tablet 0  . temozolomide (TEMODAR) 100 MG capsule Take 200 mg by mouth at bedtime. X 5 days  Total dose 340 mg x 5 days then off repeat each cycle May take on an  empty stomach or at bedtime to decrease nausea & vomiting. (Patient not taking: Reported on 01/20/2020)    . traZODone (DESYREL) 50 MG tablet Take 1 tablet (50 mg total) by mouth at bedtime as needed for sleep. 30 tablet 2   No current facility-administered medications for this visit.     Musculoskeletal: Strength & Muscle Tone: Unable to assess due to telephone visit Gait & Station: Unable to assess due to telephone visit Patient leans: N/A  Psychiatric Specialty Exam: Review of Systems  There were no vitals taken for this visit.There is no height or weight on file to calculate BMI.  General Appearance: Unable to assess due to telephone visit  Eye Contact:  Unable to assess due to telephone visit  Speech:  Clear and Coherent and Normal Rate  Volume:  Normal  Mood:  Anxious and Depressed  Affect:  Appropriate and Congruent  Thought Process:  Coherent, Goal Directed and Linear  Orientation:  Full (Time, Place, and Person)  Thought Content: Logical and Hallucinations: Auditory   Suicidal Thoughts:  Yes.  without intent/plan  Homicidal Thoughts:  No  Memory:  Immediate;   Good Recent;   Good Remote;   Good  Judgement:  Good  Insight:  Good  Psychomotor Activity:  Normal  Concentration:  Concentration: Good and Attention Span: Good  Recall:  Good  Fund of Knowledge: Good  Language: Good  Akathisia:  No  Handed:  Right  AIMS (if indicated): Not done  Assets:  Communication Skills Desire for Improvement Financial Resources/Insurance Housing Social Support  ADL's:  Intact  Cognition: WNL  Sleep:  Fair   Screenings: AIMS     Admission (Discharged) from 01/07/2020 in [facility] [facility]  AIMS Total Score 0    AUDIT     Admission (Discharged) from 01/07/2020 in [place] [facility]  Alcohol Use Disorder Identification Test Final Score (AUDIT) 14    CAGE-AID     Office Visit from 01/01/2020 in [facility]  CAGE-AID Score 3    GAD-7     Video Visit from 02/12/2020 in [facility] Office Visit from 01/01/2020 in [facility]  Total GAD-7 Score 18 20    PHQ2-9  Video Visit from 02/12/2020 in [facility] Office Visit from 01/01/2020 in [facility]  PHQ-2 Total Score 4 6  PHQ-9 Total Score 19 23       Assessment and Plan: Patient endorses symptoms of anxiety, depression, and psychosis (noting that the symptoms have improved).  He is agreeable to discontinuing BuSpar.  Provider recommended lowering the dose however he notes that he would just like to stop it.  He is also agreeable to increasing the Zyprexa 5mg  to 7.5 mg to help manage symptoms of psychosis and mood.  He is also agreeable to starting trazodone 25 mg -50 mg as needed nightly to help manage sleep.  He will continue all other medications as prescribed.  1. Schizoaffective disorder, depressive type (HCC)  Increased- OLANZapine (ZYPREXA) 7.5 MG tablet; Take 1 tablet (7.5 mg total) by mouth at bedtime.  Dispense: 30 tablet; Refill: 2 Start- traZODone (DESYREL) 50 MG tablet; Take 1 tablet (50 mg total) by mouth at bedtime as needed for sleep.  Dispense: 30 tablet; Refill: 2  2. Generalized anxiety disorder  Continue- sertraline (ZOLOFT) 50 MG tablet; Take 1 tablet (50 mg total) by mouth daily.  Dispense: 30 tablet; Refill: 0   Salley Slaughter, NP 02/12/2020, 1:56 PM

## 2020-02-20 ENCOUNTER — Other Ambulatory Visit: Payer: Self-pay

## 2020-02-20 ENCOUNTER — Ambulatory Visit (HOSPITAL_COMMUNITY)
Admission: EM | Admit: 2020-02-20 | Discharge: 2020-02-20 | Disposition: A | Discharge status: Home / Self Care | Payer: Medicaid Other

## 2020-02-20 ENCOUNTER — Encounter (HOSPITAL_COMMUNITY): Payer: Self-pay

## 2020-02-20 ENCOUNTER — Inpatient Hospital Stay: Payer: Medicaid Other

## 2020-02-20 ENCOUNTER — Inpatient Hospital Stay: Payer: Medicaid Other | Attending: Internal Medicine | Admitting: Internal Medicine

## 2020-02-20 VITALS — BP 133/90 | HR 85 | Temp 99.0°F | Resp 16 | Ht 67.0 in | Wt 145.5 lb

## 2020-02-20 DIAGNOSIS — Z79899 Other long term (current) drug therapy: Secondary | ICD-10-CM | POA: Diagnosis not present

## 2020-02-20 DIAGNOSIS — C714 Malignant neoplasm of occipital lobe: Secondary | ICD-10-CM | POA: Insufficient documentation

## 2020-02-20 DIAGNOSIS — H538 Other visual disturbances: Secondary | ICD-10-CM | POA: Insufficient documentation

## 2020-02-20 DIAGNOSIS — R519 Headache, unspecified: Secondary | ICD-10-CM | POA: Diagnosis not present

## 2020-02-20 DIAGNOSIS — Z7289 Other problems related to lifestyle: Secondary | ICD-10-CM | POA: Diagnosis not present

## 2020-02-20 DIAGNOSIS — R112 Nausea with vomiting, unspecified: Secondary | ICD-10-CM | POA: Diagnosis not present

## 2020-02-20 DIAGNOSIS — F17210 Nicotine dependence, cigarettes, uncomplicated: Secondary | ICD-10-CM | POA: Diagnosis not present

## 2020-02-20 DIAGNOSIS — F909 Attention-deficit hyperactivity disorder, unspecified type: Secondary | ICD-10-CM | POA: Diagnosis not present

## 2020-02-20 LAB — CBC WITH DIFFERENTIAL (CANCER CENTER ONLY)
Abs Immature Granulocytes: 0.03 10*3/uL (ref 0.00–0.07)
Basophils Absolute: 0 10*3/uL (ref 0.0–0.1)
Basophils Relative: 1 %
Eosinophils Absolute: 0 10*3/uL (ref 0.0–0.5)
Eosinophils Relative: 1 %
HCT: 43.8 % (ref 39.0–52.0)
Hemoglobin: 16 g/dL (ref 13.0–17.0)
Immature Granulocytes: 0 %
Lymphocytes Relative: 12 %
Lymphs Abs: 0.8 10*3/uL (ref 0.7–4.0)
MCH: 34 pg (ref 26.0–34.0)
MCHC: 36.5 g/dL — ABNORMAL HIGH (ref 30.0–36.0)
MCV: 93.2 fL (ref 80.0–100.0)
Monocytes Absolute: 0.9 10*3/uL (ref 0.1–1.0)
Monocytes Relative: 12 %
Neutro Abs: 5.2 10*3/uL (ref 1.7–7.7)
Neutrophils Relative %: 74 %
Platelet Count: 184 10*3/uL (ref 150–400)
RBC: 4.7 MIL/uL (ref 4.22–5.81)
RDW: 13.1 % (ref 11.5–15.5)
WBC Count: 7 10*3/uL (ref 4.0–10.5)
nRBC: 0 % (ref 0.0–0.2)

## 2020-02-20 LAB — CMP (CANCER CENTER ONLY)
ALT: 26 U/L (ref 0–44)
AST: 24 U/L (ref 15–41)
Albumin: 4.4 g/dL (ref 3.5–5.0)
Alkaline Phosphatase: 78 U/L (ref 38–126)
Anion gap: 13 (ref 5–15)
BUN: 14 mg/dL (ref 6–20)
CO2: 23 mmol/L (ref 22–32)
Calcium: 9.5 mg/dL (ref 8.9–10.3)
Chloride: 102 mmol/L (ref 98–111)
Creatinine: 0.93 mg/dL (ref 0.61–1.24)
GFR, Estimated: >60 mL/min (ref >60)
Glucose, Bld: 100 mg/dL — ABNORMAL HIGH (ref 70–99)
Potassium: 3.7 mmol/L (ref 3.5–5.1)
Sodium: 138 mmol/L (ref 135–145)
Total Bilirubin: 1 mg/dL (ref 0.3–1.2)
Total Protein: 7.5 g/dL (ref 6.5–8.1)

## 2020-02-20 NOTE — ED Triage Notes (Signed)
Pt presents with pain, redness and swelling in left eye X 1 week. Pt states hot compress relieves the swelling, and then it comes back. Pt states the left side of his head hurts so often. Pt states he has take ibuprofen.

## 2020-02-20 NOTE — Progress Notes (Signed)
Stephen Beard, Stephen Beard   Interval Evaluation  Date of Service: 02/20/20 Patient Name: Stephen Beard Patient MRN: [id number] Patient DOB: 09-Jul-1995 Provider: [name], MD  Identifying Statement:  Stephen Beard is a 24 y.o. 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 Zada Finders. Followed by hematoma evacuation on 07/03/19.   08/05/2019 - 09/17/2019 Radiation Therapy   IMRT with concurrent Temodar 68m/m2 daily   10/15/2019 -  Chemotherapy   The patient had temozolomide (TEMODAR) 100 MG capsule, 200 mg, Oral, Daily, 1 of 1 cycle, Start date: 01/20/2020, End date: 01/25/2020 Dose modification: 200 mg (original dose 200 mg, Cycle 1) temozolomide (TEMODAR) 140 MG capsule, 150 mg/m2/day = 280 mg, Oral, Daily, 1 of 1 cycle, Start date: 10/15/2019, End date: 10/20/2019 Dose modification: 140 mg (original dose 140 mg, Cycle 1)  for chemotherapy treatment.      Biomarkers:  MGMT Unknown.  IDH 1/2 Mutated.  EGFR Unknown  1p/19q co-deleted   Interval History:  Stephen Beard presents today for follow up after recent cycle of Temodar (#3).  He does complain of left eye redness over the past few days, plans to see eye doctor later today. He feels no new or progressive neurologic deficits.  No issues with chemo, continues on Lamictal 200/250.  H+P (07/19/19) Patient presented to medical attention with several months of progressive headaches and left sided visual impairment.  This was accompanied by episodes of deja-vu +paroxysmal burning smells which would occur almost daily over that time.  CNS imaging demonstrated large parieto-occipital mass with herniation syndrome.  Mass was resected/debulked, and post-operative course was complicated by intracranial  hematoma and also respiratory failure.  At present, he is walking and talking, carries left sided visual impairment.  Currently on antibiotics for wound infection per Dr. OZada Finders  Not requiring steroids.  Currently on lamictal 511mdaily.  Medications: Current Outpatient Medications on File Prior to Visit  Medication Sig Dispense Refill  . lamoTRIgine (LAMICTAL) 200 MG tablet Take 1 tablet (200 mg total) by mouth daily. 60 tablet 0  . lamoTRIgine (LAMICTAL) 25 MG tablet Take 10 tablets (250 mg total) by mouth at bedtime. 30 tablet 3  . ondansetron (ZOFRAN) 8 MG tablet Take 1 tablet (8 mg total) by mouth 2 (two) times daily as needed (nausea and vomiting). May take 30-60 minutes prior to Temodar administration if nausea/vomiting occurs. 30 tablet 1  . sertraline (ZOLOFT) 50 MG tablet Take 1 tablet (50 mg total) by mouth daily. 30 tablet 0  . temozolomide (TEMODAR) 100 MG capsule Take 200 mg by mouth at bedtime. X 5 days  Total dose 340 mg x 5 days then off repeat each cycle May take on an empty stomach or at bedtime to decrease nausea & vomiting.    . Marland KitchenLANZapine (ZYPREXA) 7.5 MG tablet Take 1 tablet (7.5 mg total) by mouth at bedtime. (Patient not taking: Reported on 02/20/2020) 30 tablet 2  . traZODone (DESYREL) 50 MG tablet Take 1 tablet (50 mg total) by mouth at bedtime as needed for sleep. (Patient not taking: Reported on 02/20/2020) 30 tablet 2   No current facility-administered medications on file prior to visit.    Allergies: No Known Allergies Past Medical History:  Past Medical History:  Diagnosis Date  . ADD (attention deficit disorder)   .  ADHD   . 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 Headache   . Psychotic disorder ([place])   . Seizures ([place])    Past Surgical History:  Past Surgical History:  Procedure Laterality Date  . APPLICATION OF CRANIAL NAVIGATION N/A 07/02/2019   Procedure: APPLICATION OF CRANIAL NAVIGATION;  Surgeon: Judith Part, MD;  Location: [place];   Service: Neurosurgery;  Laterality: N/A;  . CRANIOTOMY N/A 07/03/2019   Procedure: CRANIOTOMY HEMATOMA EVACUATION SUBDURAL;  Surgeon: Judith Part, MD;  Location: [place];  Service: Neurosurgery;  Laterality: N/A;  . CRANIOTOMY Right 07/03/2019   Procedure: CRANIOTOMY HEMATOMA EVACUATION SUBDURAL;  Surgeon: Judith Part, MD;  Location: [place];  Service: Neurosurgery;  Laterality: Right;  . CRANIOTOMY Right 07/02/2019   Procedure: CRANIOTOMY TUMOR EXCISION with Lucky Rathke;  Surgeon: Judith Part, MD;  Location: [place];  Service: Neurosurgery;  Laterality: Right;  posterior   Social History:  Social History   Socioeconomic History  . Marital status: Single    Spouse name: Not on file  . Number of children: Not on file  . Years of education: Not on file  . Highest education level: Not on file  Occupational History  . Not on file  Tobacco Use  . Smoking status: Current Every Day Smoker    Packs/day: 1.00    Years: 12.00    Pack years: 12.00  . Smokeless tobacco: Current User    Types: Chew  Vaping Use  . Vaping Use: Never used  Substance and Sexual Activity  . Alcohol use: Yes    Comment: last drink: "3 nights ago" when he had "1/4th of a bottle," has been drinking daily for several weeks from a "few beers to a bottle of liquor"  . Drug use: Yes    Types: Cocaine    Comment: last use "couple of weeks ago" when he had "2 lines,"  frequency "once in a blue moon"  . Sexual activity: Not Currently  Other Topics Concern  . Not on file  Social History Narrative  . Not on file   Social Determinants of Health   Financial Resource Strain: Not on file  Food Insecurity: Not on file  Transportation Needs: Not on file  Physical Activity: Not on file  Stress: Not on file  Social Connections: Not on file  Intimate Partner Violence: Not on file   Family History: No family history on file.  Review of Systems: Constitutional: Doesn't report fevers, chills or abnormal weight  loss Eyes: Doesn't report blurriness of vision Ears, nose, mouth, throat, and face: Doesn't report sore throat Respiratory: Doesn't report cough, dyspnea or wheezes Cardiovascular: Doesn't report palpitation, chest discomfort  Gastrointestinal:  Doesn't report nausea, constipation, diarrhea GU: Doesn't report incontinence Skin: Doesn't report skin rashes Neurological: Per HPI Musculoskeletal: Doesn't report joint pain Behavioral/Psych: Doesn't report anxiety  Physical Exam: Vitals:   02/20/20 1219  BP: 133/90  Pulse: 85  Resp: 16  Temp: 99 F (37.2 C)   KPS: 80. General: Alert, cooperative, pleasant, in no acute distress Head: Normal EENT: No conjunctival injection or scleral icterus.  Lungs: Resp effort normal Cardiac: Regular rate Abdomen: Non-distended abdomen Skin: No rashes cyanosis or petechiae. Extremities: No clubbing or edema  Neurologic Exam: Mental Status: Awake, alert, attentive to examiner. Oriented to self and environment. Language is fluent with intact comprehension.  Cranial Nerves: Visual acuity is grossly normal. Left homonymous hemianopia. Extra-ocular movements intact. No ptosis. Face is symmetric Motor: Tone and bulk are normal. Power  is full in both arms and legs. Reflexes are symmetric, no pathologic reflexes present.  Sensory: Intact to light touch Gait: Sensory dystaxia   Labs: I have reviewed the data as listed    Component Value Date/Time   NA 140 01/20/2020 1140   K 4.3 01/20/2020 1140   CL 106 01/20/2020 1140   CO2 26 01/20/2020 1140   GLUCOSE 101 (H) 01/20/2020 1140   BUN 11 01/20/2020 1140   CREATININE 1.00 01/20/2020 1140   CALCIUM 9.2 01/20/2020 1140   PROT 7.2 01/20/2020 1140   ALBUMIN 4.3 01/20/2020 1140   AST 44 (H) 01/20/2020 1140   ALT 74 (H) 01/20/2020 1140   ALKPHOS 80 01/20/2020 1140   BILITOT 0.5 01/20/2020 1140   GFRNONAA >60 01/20/2020 1140   GFRAA >60 12/05/2019 1206   Lab Results  Component Value Date   WBC  7.0 02/20/2020   NEUTROABS 5.2 02/20/2020   HGB 16.0 02/20/2020   HCT 43.8 02/20/2020   MCV 93.2 02/20/2020   PLT 184 02/20/2020    Assessment/Plan 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HCC) [C71.4]   Wilmot L Heidinger is clinically stable today, now having completed #3 cycles of adjuvant Temozolomide.  He is stable from mental health standpoint.  He has follow up arranged with outpatient psychiatry.  We recommended continuing treatment with cycle #4 Temozolomide, 229m/m2, on for five days and off for twenty three days in twenty eight day cycles. The patient will have a complete blood count performed on days 21 and 28 of each cycle, and a comprehensive metabolic panel performed on day 28 of each cycle. Labs may need to be performed more often. Zofran will prescribed for home use for nausea/vomiting.   We will defer start of cycle #4 until he has recovered from conjunctivitis.  He will give [place] call in a week to let u[place]ore[place] how this has improved.  Chemotherapy should be held for the following:  ANC less than 1,000  Platelets less than 100,000  LFT or creatinine greater than 2x ULN  If clinical concerns/contraindications develop  We recommended continuing Lamictal 200/250.  We ask that BNickalous StingleyBridgers return to clinic in January with MRI brain for review; scheduling will depend on start of next cycle.  All questions were answered. The patient knows to call the clinic with any problems, questions or concerns. No barriers to learning were detected.  I have spent a total of 30 minutes of face-to-face and non-face-to-face time, excluding clinical staff time, preparing to see patient, ordering tests and/or medications, counseling the patient, and independently interpreting results and communicating results to the patient/family/caregiver    ZVentura Sellers MD Medical Director of Neuro-Oncology [facility]12/09/21 12:34 PM

## 2020-02-20 NOTE — ED Notes (Signed)
Patient is being discharged from the Urgent Care and sent to the Emergency Department via POV . Per [place], patient is in need of higher level of care due to redness, swelling and loss of vision on left eye. Patient is aware and verbalizes understanding of plan of care.  Vitals:   02/20/20 1447  BP: 130/90  Pulse: 92  Resp: 17  Temp: 98.4 F (36.9 C)  SpO2: 96%

## 2020-02-20 NOTE — ED Notes (Signed)
Reported pts loss of vision on left eye to [employer] and visual acuity.

## 2020-02-21 ENCOUNTER — Emergency Department (HOSPITAL_COMMUNITY)
Admission: EM | Admit: 2020-02-21 | Discharge: 2020-02-21 | Disposition: A | Discharge status: Home / Self Care | Payer: Medicaid Other | Attending: Emergency Medicine | Admitting: Emergency Medicine

## 2020-02-21 ENCOUNTER — Other Ambulatory Visit: Payer: Self-pay | Admitting: *Deleted

## 2020-02-21 DIAGNOSIS — H5712 Ocular pain, left eye: Secondary | ICD-10-CM | POA: Diagnosis present

## 2020-02-21 DIAGNOSIS — F17200 Nicotine dependence, unspecified, uncomplicated: Secondary | ICD-10-CM | POA: Diagnosis not present

## 2020-02-21 DIAGNOSIS — H5789 Other specified disorders of eye and adnexa: Secondary | ICD-10-CM | POA: Diagnosis not present

## 2020-02-21 MED ORDER — LAMOTRIGINE 200 MG PO TABS
200.0000 mg | ORAL_TABLET | Freq: Every day | ORAL | 0 refills | Status: DC
Start: 2020-02-21 — End: 2020-02-21

## 2020-02-21 MED ORDER — FLUORESCEIN SODIUM 1 MG OP STRP
ORAL_STRIP | OPHTHALMIC | Status: AC
  Administered 2020-02-21: 2
  Filled 2020-02-21: qty 1

## 2020-02-21 MED ORDER — ERYTHROMYCIN 5 MG/GM OP OINT: TOPICAL_OINTMENT | OPHTHALMIC | 0 refills | Status: DC

## 2020-02-21 MED ORDER — LAMOTRIGINE 200 MG PO TABS: 200.0000 mg | ORAL_TABLET | Freq: Every day | ORAL | 0 refills | Status: DC

## 2020-02-21 MED ORDER — OFLOXACIN 0.3 % OP SOLN: 1.0000 [drp] | Freq: Four times a day (QID) | OPHTHALMIC | 0 refills | Status: DC

## 2020-02-21 NOTE — Discharge Instructions (Signed)
As discussed, I am sending you home with antibiotic drops and ointment for your left eye.  I discussed your case with the ophthalmologist Dr. Coralyn Pear who will see you in the office on Monday.  Call on Monday with the number provided to schedule an appointment for further evaluation.  Return to the ER if you develop severe swelling or worsening of symptoms.

## 2020-02-21 NOTE — ED Triage Notes (Signed)
Pt c/o pain, redness, swollen and drainage from his left eye for a week now.

## 2020-02-21 NOTE — ED Provider Notes (Signed)
[facility] EMERGENCY DEPARTMENT Provider Note   CSN: [id number] Arrival date & time: 02/21/20  1206     History Chief Complaint  Patient presents with  . Eye Pain    [place] is a 24 y.o. 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 Patient states symptoms started as a foreign body sensation in left eye and is progressively worsened over the past week or 2.  Eye redness associated with photophobia, painful EOMs, decreased visual acuity of the left eye from baseline, and intermittent edema of eyelid.  Of note, patient has decreased visual acuity of the left eye at baseline from oligodendroglioma however, he notes his visual acuity has decreased since symptom onset.  Denies fever and chills.  Denies any possibility of metal getting in his eye. Symptoms also associated with intermittent left-sided headache. Denies current headache now.  Denies cough, ear pain, sore throat, rhinorrhea.  History obtained from patient and past medical records. No interpreter used during encounter.      Past Medical History:  Diagnosis Date  . ADD (attention deficit disorder)   . ADHD   . Bifrontal oligodendroglioma ([place])   . Headache   . Psychotic disorder ([place])   . Seizures [facility])     Patient Active Problem List   Diagnosis Date Noted  . Alcohol abuse 01/11/2020  . 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[place]) 01/07/2020  . Intentional overdose of drug in tablet form ([place]) 01/05/2020  . Drug overdose, multiple drugs, intentional self-harm, initial encounter ([place]) 01/04/2020  . Schizoaffective disorder, depressive type ([place]) 01/01/2020  . Generalized anxiety disorder 01/01/2020  . Severe episode of recurrent major depressive disorder, with psychotic features ([place]) 01/01/2020   . Psychosis due to environmental factors as major part of etiology ([place])   . Focal seizures ([place]) 11/07/2019  . Auditory hallucinations 07/30/2019  . Goals of care, counseling/discussion 07/19/2019  . 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[place]) 07/01/2019  . Anaplastic oligodendroglioma, 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[place]) 07/01/2019  . Brain mass 06/30/2019  . Attention deficit hyperactivity disorder (ADHD), combined type 03/23/2019  . Mild episode of recurrent major depressive disorder ([place]) 03/23/2019  . Tobacco abuse 10/04/2012  . Allergic rhinitis 08/18/2012    Past Surgical History:  Procedure Laterality Date  . APPLICATION OF CRANIAL NAVIGATION N/A 07/02/2019   Procedure: APPLICATION OF CRANIAL NAVIGATION;  Surgeon: Judith Part, MD;  Location: [place];  Service: Neurosurgery;  Laterality: N/A;  . CRANIOTOMY N/A 07/03/2019   Procedure: CRANIOTOMY HEMATOMA EVACUATION SUBDURAL;  Surgeon: Judith Part, MD;  Location: [place];  Service: Neurosurgery;  Laterality: N/A;  . CRANIOTOMY Right 07/03/2019   Procedure: CRANIOTOMY HEMATOMA EVACUATION SUBDURAL;  Surgeon: Judith Part, MD;  Location: [place];  Service: Neurosurgery;  Laterality: Right;  . CRANIOTOMY Right 07/02/2019   Procedure: CRANIOTOMY TUMOR EXCISION with Lucky Rathke;  Surgeon: Judith Part, MD;  Location: [place];  Service: Neurosurgery;  Laterality: Right;  posterior       No family history on file.  Social History   Tobacco Use  . Smoking status: Current Every Day Smoker    Packs/day: 1.00    Years: 12.00    Pack years: 12.00  . Smokeless tobacco: Current User    Types: Chew  Vaping Use  . Vaping Use: Never used  Substance  Use Topics  . Alcohol use: Yes    Comment: last drink: "3 nights ago" when he had "1/4th of a bottle," has been drinking daily for several weeks from a "few beers to a bottle of liquor"  . Drug use: Yes    Types: Cocaine    Comment: last use "couple of weeks ago" when he  had "2 lines,"  frequency "once in a blue moon"    Home Medications Prior to Admission medications   Medication Sig Start Date End Date Taking? Authorizing Provider  erythromycin ophthalmic ointment Place a 1/2 inch ribbon of ointment into the lower eyelid. 02/21/20   Suzy Bouchard, PA-C  lamoTRIgine (LAMICTAL) 200 MG tablet Take 1 tablet (200 mg total) by mouth daily. 01/13/20   Cristofano, Dorene Ar, MD  lamoTRIgine (LAMICTAL) 25 MG tablet Take 10 tablets (250 mg total) by mouth at bedtime. 01/20/20   [name], MD  ofloxacin (OCUFLOX) 0.3 % ophthalmic solution Place 1 drop into the left eye 4 (four) times daily. 02/21/20   Suzy Bouchard, PA-C  OLANZapine (ZYPREXA) 7.5 MG tablet Take 1 tablet (7.5 mg total) by mouth at bedtime. Patient not taking: Reported on 02/20/2020 02/12/20   Salley Slaughter, NP  ondansetron (ZOFRAN) 8 MG tablet Take 1 tablet (8 mg total) by mouth 2 (two) times daily as needed (nausea and vomiting). May take 30-60 minutes prior to Temodar administration if nausea/vomiting occurs. 01/20/20   [name], MD  sertraline (ZOLOFT) 50 MG tablet Take 1 tablet (50 mg total) by mouth daily. 02/12/20   Salley Slaughter, NP  temozolomide (TEMODAR) 100 MG capsule Take 200 mg by mouth at bedtime. X 5 days  Total dose 340 mg x 5 days then off repeat each cycle May take on an empty stomach or at bedtime to decrease nausea & vomiting.    [provider]  traZODone (DESYREL) 50 MG tablet Take 1 tablet (50 mg total) by mouth at bedtime as needed for sleep. Patient not taking: Reported on 02/20/2020 02/12/20   Salley Slaughter, NP    Allergies    Patient has no known allergies.  Review of Systems   Review of Systems  Constitutional: Negative for chills and fever.  HENT: Positive for facial swelling. Negative for congestion, ear pain, rhinorrhea and sore throat.   Eyes: Positive for photophobia, pain, discharge, redness and visual disturbance.   Neurological: Positive for headaches. Negative for dizziness, speech difficulty and weakness.  All other systems reviewed and are negative.   Physical Exam Updated Vital Signs BP 121/82   Pulse 68   Temp 98.6 F (37 C) (Oral)   Resp 16   Ht 5' 7"  (1.702 m)   Wt 65.8 kg   SpO2 97%   BMI 22.71 kg/m   Physical Exam Vitals and nursing note reviewed.  Constitutional:      General: He is not in acute distress.    Appearance: He is not ill-appearing.  HENT:     Head: Normocephalic.     Mouth/Throat:     Comments: Ciliary flushing around left eye with extended erythema throughout conjunctiva. EOMs intact, but painful in left eye. Tenderness over left eyelid. Photophobia of left eye, no consensual photophobia.   Eyes:     Pupils: Pupils are equal, round, and reactive to light.  Cardiovascular:     Rate and Rhythm: Normal rate and regular rhythm.     Pulses: Normal pulses.     Heart sounds:  Normal heart sounds. No murmur heard. No friction rub. No gallop.   Pulmonary:     Effort: Pulmonary effort is normal.     Breath sounds: Normal breath sounds.  Abdominal:     General: Abdomen is flat. There is no distension.     Palpations: Abdomen is soft.     Tenderness: There is no abdominal tenderness. There is no guarding or rebound.  Musculoskeletal:        General: Normal range of motion.     Cervical back: Neck supple.  Skin:    General: Skin is warm and dry.  Neurological:     General: No focal deficit present.     Mental Status: He is alert.  Psychiatric:        Mood and Affect: Mood normal.        Behavior: Behavior normal.         ED Results / Procedures / Treatments   Labs (all labs ordered are listed, but only abnormal results are displayed) Labs Reviewed - No data to display  EKG None  Radiology No results found.  Procedures Procedures (including critical care time)  Medications Ordered in ED Medications  fluorescein 1 MG ophthalmic strip (2 strips   Given 02/21/20 1414)    ED Course  I have reviewed the triage vital signs and the nursing notes.  Pertinent labs & imaging results that were available during my care of the patient were reviewed by me and considered in my medical decision making (see chart for details).    MDM Rules/Calculators/A&P                         24 year old male presents to the ED due to left eye redness associated with decreased visual acuity, pain with EOMs, photophobia, and tenderness of left eyelid x1-2 weeks.  Patient has a history of oligodendroglioma status post resection currently on chemotherapy.  Upon arrival, vitals all within normal limits.  Patient in no acute distress and non-ill-appearing.  Physical exam significant for ciliary flushing of left eye with tenderness over left eyelid.  EOMs intact with painful EOMs of the left eye. IOP right eye: 17; left eye: 13.  Fluorescein uptake of left eye.  See photo above.  Pupils round and reactive to light. Visual acuity: OD 20/80 OU 20/80 Unable to visualize anything on chart with left eye due to blurriness. Of note, patient has baseline left eye blurriness. No lesions in ear to suggest shingles infection. Low suspicion for orbital or preseptal cellulitis. Discussed case with Dr. Coralyn Pear with ophthalmology who recommends ofloxacin drops and erythromycin ointment and follow-up in the office on Monday for further evaluation. Strict ED precautions discussed with patient. Patient states understanding and agrees to plan. Patient discharged home in no acute distress and stable vitals.  Discussed case with Dr. Vanita Panda who agrees with assessment and plan.  Final Clinical Impression(s) / ED Diagnoses Final diagnoses:  Redness of left eye    Rx / DC Orders ED Discharge Orders         Ordered    ofloxacin (OCUFLOX) 0.3 % ophthalmic solution  4 times daily        02/21/20 1448    erythromycin ophthalmic ointment        02/21/20 1448           Karie Kirks 02/21/20 1455    Carmin Muskrat, MD 02/21/20 1701

## 2020-02-21 NOTE — ED Notes (Signed)
Visual Acuity  OD: 20/80 OS: No data, citing blurred vision OU: 20/80

## 2020-02-26 ENCOUNTER — Encounter (INDEPENDENT_AMBULATORY_CARE_PROVIDER_SITE_OTHER): Payer: Self-pay | Admitting: Ophthalmology

## 2020-02-26 ENCOUNTER — Other Ambulatory Visit: Payer: Self-pay

## 2020-02-26 ENCOUNTER — Ambulatory Visit (INDEPENDENT_AMBULATORY_CARE_PROVIDER_SITE_OTHER): Payer: Medicaid Other | Admitting: Ophthalmology

## 2020-02-26 DIAGNOSIS — B0052 Herpesviral keratitis: Secondary | ICD-10-CM | POA: Diagnosis not present

## 2020-02-26 DIAGNOSIS — H3581 Retinal edema: Secondary | ICD-10-CM | POA: Diagnosis not present

## 2020-02-26 MED ORDER — VALACYCLOVIR HCL 500 MG PO TABS: 500.0000 mg | ORAL_TABLET | Freq: Three times a day (TID) | ORAL | 0 refills | Status: AC

## 2020-02-26 MED ORDER — TRIFLURIDINE 1 % OP SOLN: 1.0000 [drp] | OPHTHALMIC | 1 refills | Status: AC

## 2020-02-26 NOTE — Progress Notes (Addendum)
[facility] Note  02/26/2020     CHIEF COMPLAINT Patient presents for Retina Evaluation   HISTORY OF PRESENT ILLNESS: Stephen Beard is a 24 y.o. male who presents to the clinic today for:   HPI    Retina Evaluation    In left eye.  This started 2 weeks ago.  Duration of 2 weeks.  Associated Symptoms Distortion, Pain, Photophobia and Glare.  Context:  distance vision, mid-range vision and near vision.  Treatments tried include eye drops.  Response to treatment was no improvement.  I, the attending physician,  performed the HPI with the patient and updated documentation appropriately.          Comments    24 y/o male pt referred by [facility] ED for eval of OS.  Sudden onset symptoms of severely decreased VA, pain, redness, glare, photophobia and discharge OS x 2 wks.  Symptoms have not improved, and have no obvious cause.  Se[facility] 12.10.21 and started on Ofloxacin gtts QID OS and erythromycin ung QHS OS.  Symptoms have not improved w/tx.  No issues OD.  VA blurred OD.  Pt had craniotomy for brain mass 06/2019, and reports some issues with his left eye prior to that.       Last edited by Bernarda Caffey, MD on 02/26/2020 10:30 PM. (History)    pt states he went to [facility] ED bc he felt like he had something stuck in his upper eye lid, he states it continued to get worse and got red, he thought he had pink eye, he states his upper lid is painful, he states he was given erythromycin ung in the hospital and it seems to help with the pain and irritation, pt states he had brain sx which has affected the vision in his left eye, but his vision is worse now than his normal baseline  Referring physician: Suzy Bouchard, PA-C [address],   [id number]  HISTORICAL INFORMATION:   Selected notes from the MEDICAL RECORD NUMBER Referred by [facility] ED for blurry vision OS LEE:  Ocular Hx- PMH-    CURRENT MEDICATIONS: Current Outpatient Medications  (Ophthalmic Drugs)  Medication Sig  . erythromycin ophthalmic ointment Place a 1/2 inch ribbon of ointment into the lower eyelid.  Marland Kitchen ofloxacin (OCUFLOX) 0.3 % ophthalmic solution Place 1 drop into the left eye 4 (four) times daily.  Marland Kitchen trifluridine (VIROPTIC) 1 % ophthalmic solution Place 1 drop into the left eye every 2 (two) hours while awake for 10 days.   No current facility-administered medications for this visit. (Ophthalmic Drugs)   Current Outpatient Medications (Other)  Medication Sig  . lamoTRIgine (LAMICTAL) 200 MG tablet Take 1 tablet (200 mg total) by mouth daily.  Marland Kitchen lamoTRIgine (LAMICTAL) 25 MG tablet Take 10 tablets (250 mg total) by mouth at bedtime.  Marland Kitchen OLANZapine (ZYPREXA) 7.5 MG tablet Take 1 tablet (7.5 mg total) by mouth at bedtime. (Patient not taking: Reported on 02/20/2020)  . ondansetron (ZOFRAN) 8 MG tablet Take 1 tablet (8 mg total) by mouth 2 (two) times daily as needed (nausea and vomiting). May take 30-60 minutes prior to Temodar administration if nausea/vomiting occurs.  . sertraline (ZOLOFT) 50 MG tablet Take 1 tablet (50 mg total) by mouth daily.  Marland Kitchen temozolomide (TEMODAR) 100 MG capsule Take 200 mg by mouth at bedtime. X 5 days  Total dose 340 mg x 5 days then off repeat each cycle May take on  an empty stomach or at bedtime to decrease nausea & vomiting.  . traZODone (DESYREL) 50 MG tablet Take 1 tablet (50 mg total) by mouth at bedtime as needed for sleep. (Patient not taking: Reported on 02/20/2020)  . valACYclovir (VALTREX) 500 MG tablet Take 1 tablet (500 mg total) by mouth 3 (three) times daily for 14 days.   No current facility-administered medications for this visit. (Other)      REVIEW OF SYSTEMS: ROS    Positive for: Neurological, Eyes, Psychiatric   Negative for: Constitutional, Gastrointestinal, Skin, Genitourinary, Musculoskeletal, HENT, Endocrine, Cardiovascular, Respiratory, Allergic/Imm, Heme/Lymph   Last edited by Matthew Folks, COA on  02/26/2020  1:40 PM. (History)       ALLERGIES No Known Allergies  PAST MEDICAL HISTORY Past Medical History:  Diagnosis Date  . ADD (attention deficit disorder)   . ADHD   . Bifrontal oligodendroglioma ([place])   . Headache   . Psychotic disorder ([place])   . Seizures ([place])    Past Surgical History:  Procedure Laterality Date  . APPLICATION OF CRANIAL NAVIGATION N/A 07/02/2019   Procedure: APPLICATION OF CRANIAL NAVIGATION;  Surgeon: Judith Part, MD;  Location: [place];  Service: Neurosurgery;  Laterality: N/A;  . CRANIOTOMY N/A 07/03/2019   Procedure: CRANIOTOMY HEMATOMA EVACUATION SUBDURAL;  Surgeon: Judith Part, MD;  Location: [place];  Service: Neurosurgery;  Laterality: N/A;  . CRANIOTOMY Right 07/03/2019   Procedure: CRANIOTOMY HEMATOMA EVACUATION SUBDURAL;  Surgeon: Judith Part, MD;  Location: [place];  Service: Neurosurgery;  Laterality: Right;  . CRANIOTOMY Right 07/02/2019   Procedure: CRANIOTOMY TUMOR EXCISION with Lucky Rathke;  Surgeon: Judith Part, MD;  Location: [place];  Service: Neurosurgery;  Laterality: Right;  posterior    FAMILY HISTORY History reviewed. No pertinent family history.  SOCIAL HISTORY Social History   Tobacco Use  . Smoking status: Current Every Day Smoker    Packs/day: 1.00    Years: 12.00    Pack years: 12.00  . Smokeless tobacco: Current User    Types: Chew  Vaping Use  . Vaping Use: Never used  Substance Use Topics  . Alcohol use: Yes    Comment: last drink: "3 nights ago" when he had "1/4th of a bottle," has been drinking daily for several weeks from a "few beers to a bottle of liquor"  . Drug use: Yes    Types: Cocaine    Comment: last use "couple of weeks ago" when he had "2 lines,"  frequency "once in a blue moon"         OPHTHALMIC EXAM:  Base Eye Exam    Visual Acuity (Snellen - Linear)      Right Left   Dist [place] 20/40 -2 20/400   Dist ph [place] NI 20/250 -       Tonometry (Tonopen, 1:44 PM)       Right Left   Pressure 15 24       Pupils      Dark Light Shape React APD   Right 5 5 Round Minimal None   Left 5 5 Round Minimal None       Visual Fields (Counting fingers)      Left Right    Full Full       Extraocular Movement      Right Left    Full, Ortho Full, Ortho       Neuro/Psych    Oriented x3: Yes   Mood/Affect: Normal  Dilation    Both eyes: 1.0% Mydriacyl, 2.5% Phenylephrine @ 1:44 PM        Slit Lamp and Fundus Exam    External Exam      Right Left   External  Periorbital edema and erythema - mild        Slit Lamp Exam      Right Left   Lids/Lashes Normal UL edema and erythema   Conjunctiva/Sclera White and quiet 2-3+ Injection greatest nasally   Cornea Clear Large Dendritic epi defect 6.5x3.83mm encompassing SN hemisphere, 2+ Punctate epithelial erosions   Anterior Chamber deep and clear deep and clear   Iris Round and dilated Round and dilated   Lens Clear Clear   Vitreous Vitreous syneresis Vitreous syneresis       Fundus Exam      Right Left   Disc 2-3+ Pallor, Sharp rim, temporal PPP Pallor, Sharp rim   C/D Ratio 0.4 0.4   Macula Flat, Blunted foveal reflex, +sheen Flat, Blunted foveal reflex, +sheen, No heme or edema   Vessels mild attenuation mild attenuation   Periphery Attached    Attached           Refraction    Manifest Refraction      Sphere Cylinder Dist VA   Right [place] Sphere 20/40-2   Left [place] Sphere 20/350          IMAGING AND PROCEDURES  Imaging and Procedures for 02/26/2020  OCT, Retina - OU - Both Eyes       Right Eye Quality was good. Central Foveal Thickness: 200. Progression has no prior data. Findings include abnormal foveal contour, no IRF, no SRF, outer retinal atrophy (Generalized diffuse atrophy, focal perifoveal ellipsoid loss).   Left Eye Quality was good. Central Foveal Thickness: 218. Progression has no prior data. Findings include abnormal foveal contour, no IRF, no SRF, outer retinal  atrophy (Generalized diffuse atrophy, focal perifoveal ellipsoid loss;Trace ERM).   Notes *Images captured and stored on drive  Diagnosis / Impression:  Generalized diffuse atrophy, focal perifoveal ellipsoid loss OU  Clinical management:  See below  Abbreviations: NFP - Normal foveal profile. CME - cystoid macular edema. PED - pigment epithelial detachment. IRF - intraretinal fluid. SRF - subretinal fluid. EZ - ellipsoid zone. ERM - epiretinal membrane. ORA - outer retinal atrophy. ORT - outer retinal tubulation. SRHM - subretinal hyper-reflective material. 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 Retinal edema  H35.81 OCT, Retina - OU - Both Eyes    1. Herpes keratitis OS  - 2 wk history of dec VA, eye pain, redness, glare, phophobia OS  - was seen in [facility] ED on 12.10.21 -- started on Ofloxacin gtts QID OS and erythromycin ung QID OS  - exam today shows large dendritic epithelial defect sup/temp hemisphere ~6 x 3 mm and mild conjunctivitis and lid swelling  - no posterior involvement on dilated exam  - BCVA 20/250 OS -- pt reports baseline vision decreased OS secondary to history of occipital oligodendroglioma s/p craniotomy, tumor excision on 4.20.21  - continue oflaxicin drops QID OS  - start po valacyclovir 500mg  TID  - start viroptic gtts Q2H OS  - start cyclopentolate BID OS  - will refer to Dr. Sharen Counter, cornea specialist, for further eval and management -- appt scheduled for  Friday, December 17 at 9:45am  2. No retinal edema on exam or OCT  - OCT shows generalized diffuse atrophy, focal perifoveal ellipsoid loss   Ophthalmic Meds Ordered this visit:  Meds ordered this encounter  Medications  . trifluridine (VIROPTIC) 1 % ophthalmic solution    Sig: Place 1 drop into the left eye every 2 (two) hours while awake for 10 days.    Dispense:  7.5 mL    Refill:   1  . valACYclovir (VALTREX) 500 MG tablet    Sig: Take 1 tablet (500 mg total) by mouth 3 (three) times daily for 14 days.    Dispense:  42 tablet    Refill:  0       Return if symptoms worsen or fail to improve.  There are no Patient Instructions on file for this visit.   Explained the diagnoses, plan, and follow up with the patient and they expressed understanding.  Patient expressed understanding of the importance of proper follow up care.   This document serves as a record of services personally performed by Gardiner Sleeper, MD, PhD. It was created on their behalf by San Jetty. Owens Shark, OA an ophthalmic technician. The creation of this record is the provider's dictation and/or activities during the visit.    Electronically signed by: San Jetty. Owens Shark, [place] 12.15.2021 10:48 PM   Gardiner Sleeper, M.D., Ph.D. Diseases & Surgery of the Retina and Vitreous [facility]  I have reviewed the above documentation for accuracy and completeness, and I agree with the above. Gardiner Sleeper, M.D., Ph.D. 02/26/20 10:48 PM    Abbreviations: M myopia (nearsighted); A astigmatism; H hyperopia (farsighted); P presbyopia; Mrx spectacle prescription;  CTL contact lenses; OD right eye; OS left eye; OU both eyes  XT exotropia; ET esotropia; PEK punctate epithelial keratitis; PEE punctate epithelial erosions; DES dry eye syndrome; MGD meibomian gland dysfunction; ATs artificial tears; PFAT's preservative free artificial tears; [place] nuclear sclerotic cataract; PSC posterior subcapsular cataract; ERM epi-retinal membrane; PVD posterior vitreous detachment; RD retinal detachment; DM diabetes mellitus; DR diabetic retinopathy; NPDR non-proliferative diabetic retinopathy; PDR proliferative diabetic retinopathy; CSME clinically significant macular edema; DME diabetic macular edema; dbh dot blot hemorrhages; CWS cotton wool spot; POAG primary open angle glaucoma; C/D cup-to-disc ratio; HVF  humphrey visual field; GVF goldmann visual field; OCT optical coherence tomography; IOP intraocular pressure; BRVO Branch retinal vein occlusion; CRVO central retinal vein occlusion; CRAO central retinal artery occlusion; BRAO branch retinal artery occlusion; RT retinal tear; SB scleral buckle; PPV pars plana vitrectomy; VH Vitreous hemorrhage; PRP panretinal laser photocoagulation; IVK intravitreal kenalog; VMT vitreomacular traction; MH Macular hole;  NVD neovascularization of the disc; NVE neovascularization elsewhere; AREDS age related eye disease study; ARMD age related macular degeneration; POAG primary open angle glaucoma; EBMD epithelial/anterior basement membrane dystrophy; ACIOL anterior chamber intraocular lens; IOL intraocular lens; PCIOL posterior chamber intraocular lens; Phaco/IOL phacoemulsification with intraocular lens placement; [place] photorefractive keratectomy; LASIK laser assisted in situ keratomileusis; HTN hypertension; DM diabetes mellitus; COPD chronic obstructive pulmonary disease

## 2020-02-27 ENCOUNTER — Other Ambulatory Visit: Payer: Self-pay | Admitting: *Deleted

## 2020-02-27 ENCOUNTER — Other Ambulatory Visit: Payer: Self-pay | Admitting: Internal Medicine

## 2020-02-27 ENCOUNTER — Telehealth: Payer: Self-pay | Admitting: *Deleted

## 2020-02-27 MED ORDER — LAMOTRIGINE 25 MG PO TABS: 250.0000 mg | ORAL_TABLET | Freq: Every day | ORAL | 3 refills | Status: DC

## 2020-02-27 MED ORDER — LAMOTRIGINE 200 MG PO TABS: ORAL_TABLET | ORAL | 2 refills | Status: DC

## 2020-02-27 NOTE — Telephone Encounter (Signed)
Patients mother called.  She is stating that pharmacy is unable to refill as no refills on file and according to pharmacy records it is too early to refill as previous refill for Lamictal was 200 mg once daily however, she states that they were instructed to take it twice a day.  Need clarification on Lamictal dose and will need new Rx if BID is appropriate.  Routed to MD to advise.

## 2020-02-28 ENCOUNTER — Telehealth (HOSPITAL_COMMUNITY): Payer: Medicaid Other | Admitting: Psychiatry

## 2020-03-03 ENCOUNTER — Telehealth: Payer: Self-pay | Admitting: *Deleted

## 2020-03-03 NOTE — Telephone Encounter (Signed)
Follow up completed with patients mother [name] in regards to what was thought to be pink eye.  Patient went to ED and was referred to Opthamologist on 02/26/2020 and then seen a second time by a Cornea Specialist on 02/28/2020 for Herpes of the eye.  Patient was started on Valtrex po, erythromycyin ointment, Ocuflox gtts, viroptic gtts, cyclogyl gtts.  Mother states that patient started this medication this past weekend and hasn't seen much improvement yet.  The eye still is red and irritated.  Routed to Dr Mickeal Skinner to advise if patient should proceed with next cycle of Temodar and when he should follow up with him again and if a repeat scan is warranted before his next follow up

## 2020-03-05 ENCOUNTER — Other Ambulatory Visit: Payer: Self-pay | Admitting: *Deleted

## 2020-03-05 DIAGNOSIS — C714 Malignant neoplasm of occipital lobe: Secondary | ICD-10-CM

## 2020-03-16 ENCOUNTER — Other Ambulatory Visit: Payer: Self-pay | Admitting: Radiation Therapy

## 2020-03-23 ENCOUNTER — Emergency Department (HOSPITAL_COMMUNITY)
Admission: EM | Admit: 2020-03-23 | Discharge: 2020-03-23 | Discharge status: Absent without leave | Payer: Medicaid Other

## 2020-03-23 NOTE — ED Notes (Signed)
No answer for triage.

## 2020-03-23 NOTE — ED Notes (Signed)
No answer in lobby.

## 2020-03-23 NOTE — ED Notes (Signed)
Pt did not answer for triage. 

## 2020-03-26 ENCOUNTER — Ambulatory Visit (HOSPITAL_COMMUNITY): Payer: Medicaid Other

## 2020-03-30 ENCOUNTER — Other Ambulatory Visit (HOSPITAL_COMMUNITY): Payer: Self-pay | Admitting: Psychiatry

## 2020-03-30 DIAGNOSIS — F411 Generalized anxiety disorder: Secondary | ICD-10-CM

## 2020-03-31 ENCOUNTER — Telehealth: Payer: Self-pay | Admitting: Internal Medicine

## 2020-03-31 ENCOUNTER — Telehealth: Payer: Self-pay

## 2020-03-31 ENCOUNTER — Inpatient Hospital Stay: Payer: Medicaid Other | Admitting: Internal Medicine

## 2020-03-31 ENCOUNTER — Other Ambulatory Visit: Payer: Self-pay | Admitting: Internal Medicine

## 2020-03-31 DIAGNOSIS — C714 Malignant neoplasm of occipital lobe: Secondary | ICD-10-CM

## 2020-03-31 NOTE — Telephone Encounter (Signed)
Patient's MRI rescheduled to 04/04/20 at 8am at [facility]. Called and informed patient of date and time of appointment and instructed patient to arrive by 7:30am. Patient verbalized understanding. Scheduling message sent to schedule MD follow-up visit.

## 2020-03-31 NOTE — Telephone Encounter (Signed)
Rescheduled appointment per 1/18 sch msg. Called patient, no answer. Left message with appointment date and time.

## 2020-04-01 NOTE — Telephone Encounter (Signed)
Refill request

## 2020-04-04 ENCOUNTER — Ambulatory Visit (HOSPITAL_COMMUNITY): Admission: RE | Admit: 2020-04-04 | Payer: Medicaid Other | Source: Ambulatory Visit

## 2020-04-06 ENCOUNTER — Inpatient Hospital Stay: Payer: Medicaid Other

## 2020-04-07 ENCOUNTER — Telehealth: Payer: Self-pay

## 2020-04-07 NOTE — Telephone Encounter (Signed)
Patient called office stating he missed his scheduled MRI on 04/04/20 due to the weather and wanted to rescheduled. MRI rescheduled for 04/15/20 at 9am at [facility]. Patient is to arrive at 8:30am.  Patient informed of date and time of rescheduled MRI and verbalized understanding. Scheduling message sent to reschedule follow-up with Dr. Mickeal Skinner.

## 2020-04-09 ENCOUNTER — Inpatient Hospital Stay: Payer: Medicaid Other | Admitting: Internal Medicine

## 2020-04-15 ENCOUNTER — Other Ambulatory Visit: Payer: Self-pay

## 2020-04-15 ENCOUNTER — Ambulatory Visit (HOSPITAL_COMMUNITY)
Admission: RE | Admit: 2020-04-15 | Discharge: 2020-04-15 | Disposition: A | Discharge status: Home / Self Care | Payer: Medicaid Other | Source: Ambulatory Visit | Attending: Internal Medicine | Admitting: Internal Medicine

## 2020-04-15 DIAGNOSIS — C714 Malignant neoplasm of occipital lobe: Secondary | ICD-10-CM | POA: Insufficient documentation

## 2020-04-15 IMAGING — MR MR HEAD WO/W CM
15 series · 48 of 48 positions shown · IV contrast (gadavist)
Comparison: Head CT [DATE]; head MRI [DATE].

CLINICAL DATA: History of oligodendroglioma. Assess treatment
response.

EXAM:
MRI HEAD WITHOUT AND WITH CONTRAST
TECHNIQUE: Multiplanar, multiecho pulse sequences of the brain and surrounding
structures were obtained without and with intravenous contrast.
CONTRAST:  6mL GADAVIST GADOBUTROL 1 MMOL/ML IV SOLN

[Series 5: DWI · axial · 3.0mm · 1.36mm/px · z∈[-33,+120]mm · 6 of 104 slices shown (1 of 4)]
[im 1/104]
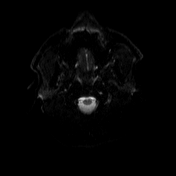
[im 21/104]
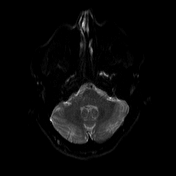
[im 42/104]
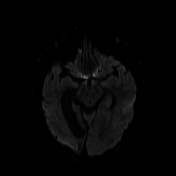
[im 62/104]
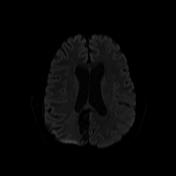
[im 83/104]
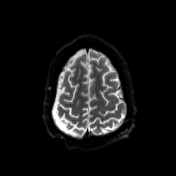
[im 104/104]
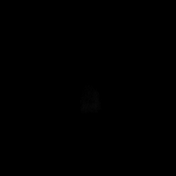

[Series 6: DWI · axial · 3.0mm · 1.36mm/px · z∈[-33,+120]mm · 2 of 51 slices shown (2 of 4)]
[im 1/51]
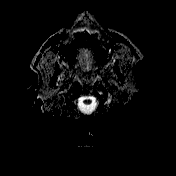
[im 51/51]
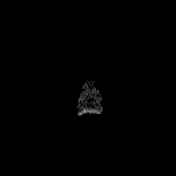

[Series 7: T1 · sagittal · 5.0mm · 0.75mm/px · 1 of 24 slices shown (1 of 2)]
[im 1/24]
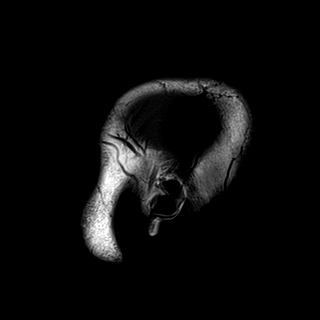

[Series 8: T2 · axial · 5.0mm · 0.62mm/px · 1 of 28 slices shown]
[im 1/28]
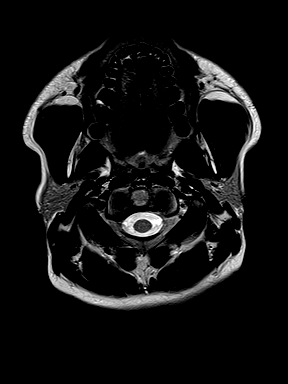

[Series 9: mip_images(sw) · axial · 24.0mm · 0.75mm/px · z∈[-33,+111]mm · 2 of 49 slices shown]
[im 1/49]
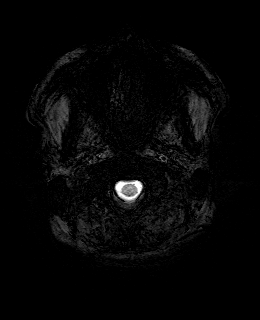
[im 49/49]
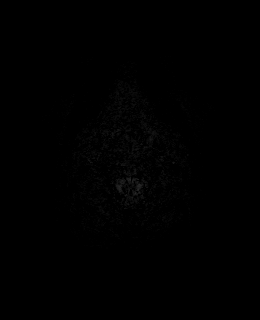

[Series 10: swi_images · axial · 3.0mm · 0.75mm/px · z∈[-43,+121]mm · 2 of 56 slices shown]
[im 1/56]
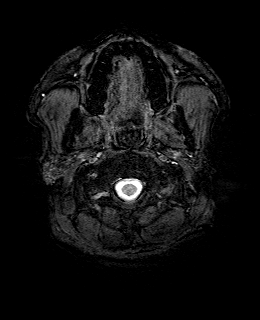
[im 56/56]
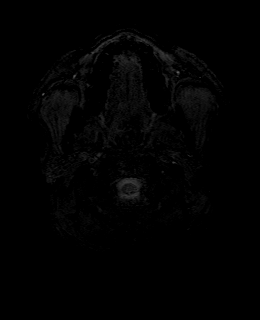

[Series 11: FLAIR · axial · 3.0mm · 0.75mm/px · z∈[-42,+120]mm · 2 of 55 slices shown]
[im 1/55]
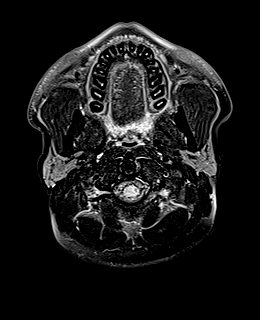
[im 55/55]
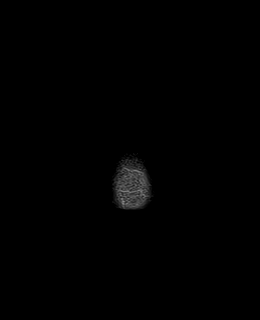

[Series 12: T1 · axial · 1.0mm · 0.94mm/px · z∈[-48,+126]mm · 8 of 176 slices shown (2 of 2)]
[im 1/176]
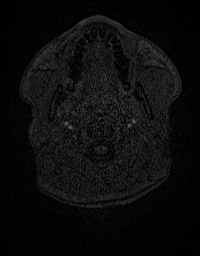
[im 26/176]
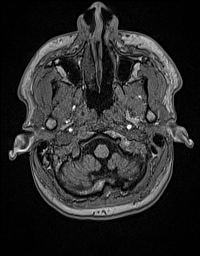
[im 51/176]
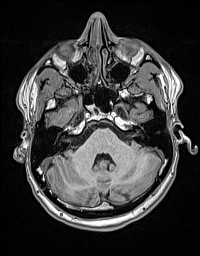
[im 76/176]
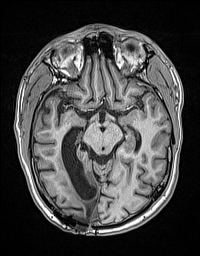
[im 101/176]
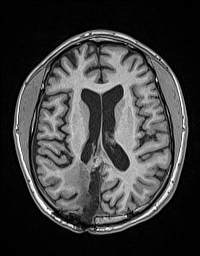
[im 126/176]
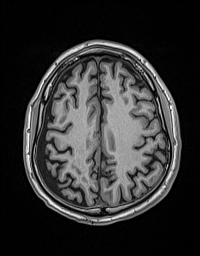
[im 151/176]
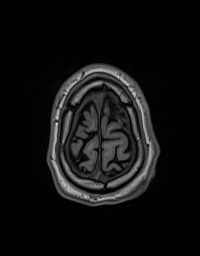
[im 176/176]
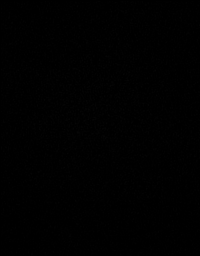

[Series 13: DWI · coronal · 5.0mm · 1.31mm/px · 3 of 76 slices shown (3 of 4)]
[im 1/76]
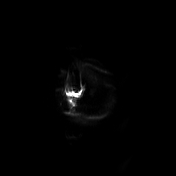
[im 38/76]
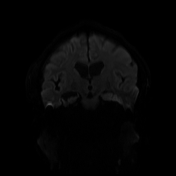
[im 76/76]
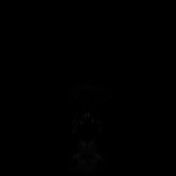

[Series 14: DWI · coronal · 5.0mm · 1.31mm/px · 2 of 38 slices shown (4 of 4)]
[im 1/38]
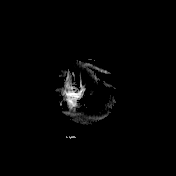
[im 38/38]
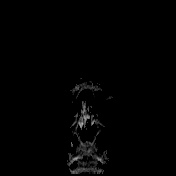

[Series 15: T2 post-contrast · coronal · 5.0mm · 0.57mm/px · 1 of 32 slices shown]
[im 1/32]
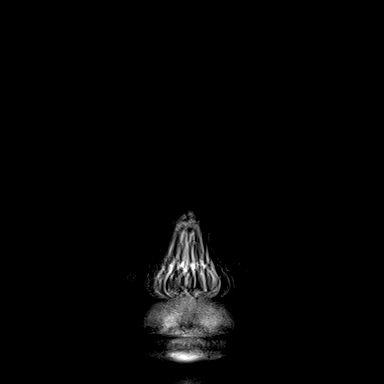

[Series 16: T1 post-contrast · axial · 1.0mm · 0.94mm/px · z∈[-48,+126]mm · 8 of 176 slices shown (1 of 4)]
[im 1/176]
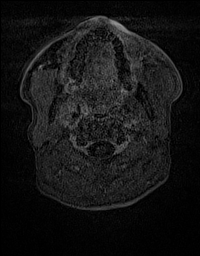
[im 26/176]
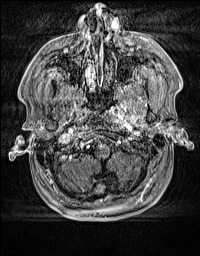
[im 51/176]
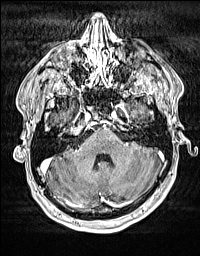
[im 76/176]
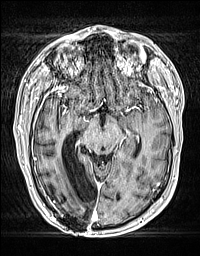
[im 101/176]
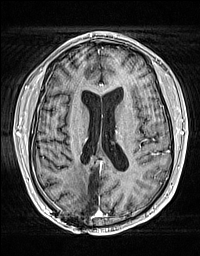
[im 126/176]
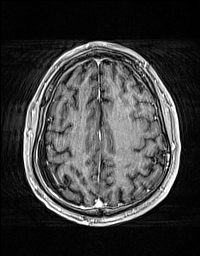
[im 151/176]
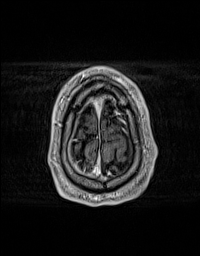
[im 176/176]
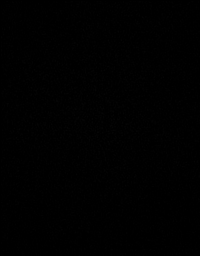

[Series 17: T1 post-contrast · coronal · 5.0mm · 0.43mm/px · 1 of 32 slices shown (2 of 4)]
[im 1/32]
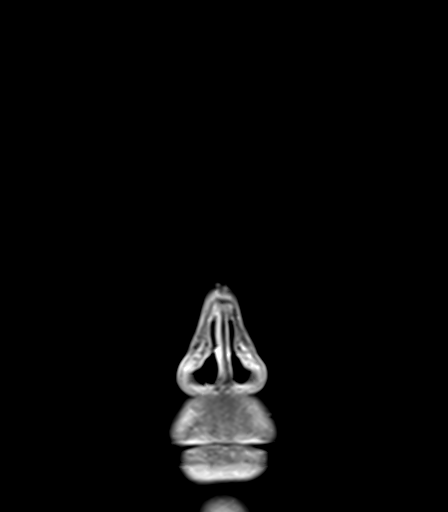

[Series 18: T1 post-contrast · sagittal · 5.0mm · 0.75mm/px · 1 of 26 slices shown (3 of 4)]
[im 1/26]
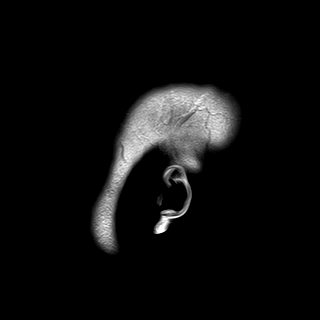

[Series 19: T1 post-contrast · axial · 1.0mm · 0.94mm/px · z∈[-48,+126]mm · 8 of 176 slices shown (4 of 4)]
[im 1/176]
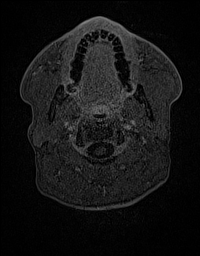
[im 26/176]
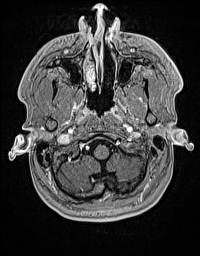
[im 51/176]
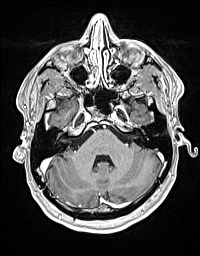
[im 76/176]
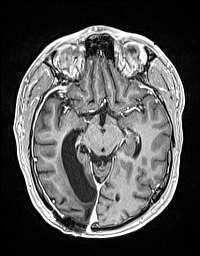
[im 101/176]
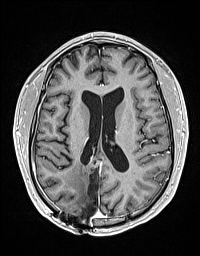
[im 126/176]
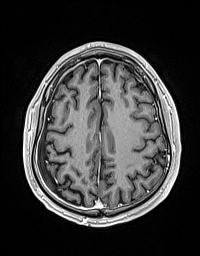
[im 151/176]
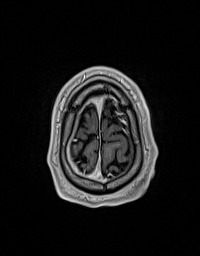
[im 176/176]
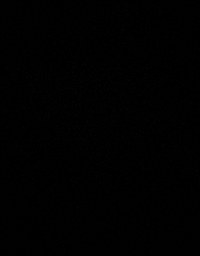

[48 of 48 positions shown; findings below may reference images not displayed]

FINDINGS: Brain: Postsurgical changes from resection of large right paramedian
parietooccipital lesion with surgical cavity in the medial aspect of
the inferior right parietal lobe and right occipital lobe.
Surrounding T2 hyperintensity extending to the posterior right
frontal lobe and posterior and medial right temporal lobe appear
unchanged. There is retraction of the atrium and temporal horn of
the right lateral ventricle as well as prominence of the parietal
occipital sulci and prominent right hippocampal volume loss.

Residual foci of contrast enhancement in the right periatrial region
(series 19, image 91) and subcortical right parietal lobe (series
19, image 109) have further decreased in size. A new punctate focus
of faint contrast enhancement is seen within the area of T2
hyperintensity in the right occipital lobe (series 19, image 102).

Mild interval improvement of the small right convexity subdural
fluid collection measuring up to 5 mm (6-7 mm on prior).

No acute infarct, new hemorrhage or midline shift.

Vascular: Normal flow voids.

Skull and upper cervical spine: Postsurgical changes from left
occipital craniotomy. No focal marrow lesion identified.

Sinuses/Orbits: Negative.
IMPRESSION: Postsurgical changes with stable appearance of right
parietooccipital surgical cavity with new focus of faint contrast
enhancement within the area of T2 hyperintensity in the right
occipital lobe, as described above. Short-term follow-up suggested.
The area of T2 hyperintensity surrounding the surgical cavity appear
otherwise stable.

## 2020-04-15 MED ORDER — GADOBUTROL 1 MMOL/ML IV SOLN
6.0000 mL | Freq: Once | INTRAVENOUS | Status: AC | PRN
  Administered 2020-04-15: 6 mL via INTRAVENOUS

## 2020-04-20 ENCOUNTER — Telehealth: Payer: Self-pay

## 2020-04-20 ENCOUNTER — Inpatient Hospital Stay: Payer: Medicaid Other | Admitting: Internal Medicine

## 2020-04-20 ENCOUNTER — Inpatient Hospital Stay: Payer: Medicaid Other | Attending: Internal Medicine

## 2020-04-20 DIAGNOSIS — C714 Malignant neoplasm of occipital lobe: Secondary | ICD-10-CM | POA: Insufficient documentation

## 2020-04-20 NOTE — Telephone Encounter (Signed)
Patient did not show up for his 10:30 appointment with Dr. Mickeal Skinner on 04/20/20. Called patient- no answer. Left voicemail for patient to return call to [phone] to reschedule. Dr. Mickeal Skinner made aware.

## 2020-04-24 ENCOUNTER — Other Ambulatory Visit: Payer: Self-pay | Admitting: Internal Medicine

## 2020-04-24 ENCOUNTER — Inpatient Hospital Stay (HOSPITAL_BASED_OUTPATIENT_CLINIC_OR_DEPARTMENT_OTHER): Payer: Medicaid Other | Admitting: Internal Medicine

## 2020-04-24 ENCOUNTER — Other Ambulatory Visit: Payer: Self-pay

## 2020-04-24 VITALS — BP 137/96 | HR 92 | Temp 97.8°F | Resp 18 | Ht 67.0 in | Wt 141.3 lb

## 2020-04-24 DIAGNOSIS — R569 Unspecified convulsions: Secondary | ICD-10-CM | POA: Diagnosis not present

## 2020-04-24 DIAGNOSIS — C714 Malignant neoplasm of occipital lobe: Secondary | ICD-10-CM | POA: Diagnosis not present

## 2020-04-24 MED ORDER — TEMOZOLOMIDE 100 MG PO CAPS: 200.0000 mg | ORAL_CAPSULE | Freq: Every day | ORAL | 0 refills | Status: DC

## 2020-04-24 MED ORDER — TEMOZOLOMIDE 140 MG PO CAPS: 140.0000 mg | ORAL_CAPSULE | Freq: Every day | ORAL | 0 refills | Status: AC

## 2020-04-24 MED FILL — TEMOZOLOMIDE 140 MG CAPS: 140 | 5 days supply | Qty: 5 | Fill #0

## 2020-04-24 MED FILL — TEMOZOLOMIDE 100 MG CAPS: 100 | 5 days supply | Qty: 10 | Fill #0

## 2020-04-24 NOTE — Progress Notes (Signed)
[facility] [place], [place] [address] [phone]   Interval Evaluation  Date of Service: 04/24/20 Patient Name: Stephen Beard Patient MRN: [id number] Patient DOB: 07-01-1995 Provider: [name], MD  Identifying Statement:  Stephen Beard is a 25 y.o. 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 Zada Finders. Followed by hematoma evacuation on 07/03/19.   08/05/2019 - 09/17/2019 Radiation Therapy   IMRT with concurrent Temodar 43m/m2 daily   10/15/2019 -  Chemotherapy   The patient had temozolomide (TEMODAR) 100 MG capsule, 200 mg, Oral, Daily, 1 of 1 cycle, Start date: 01/20/2020, End date: 01/25/2020 Dose modification: 200 mg (original dose 200 mg, Cycle 1) temozolomide (TEMODAR) 140 MG capsule, 150 mg/m2/day = 280 mg, Oral, Daily, 1 of 1 cycle, Start date: 10/15/2019, End date: 10/20/2019 Dose modification: 140 mg (original dose 140 mg, Cycle 1)  for chemotherapy treatment.      Biomarkers:  MGMT Unknown.  IDH 1/2 Mutated.  EGFR Unknown  1p/19q co-deleted   Interval History:  Stephen Beard presents today for follow up after MRI brain.  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 He feels no new or progressive neurologic deficits.  No seizures, occassional tension headaches only.  [place] more stable home environment.  H+P (07/19/19) Patient presented to medical attention with several months of progressive headaches and left sided visual impairment.  This was accompanied by episodes of deja-vu +paroxysmal burning smells which would occur almost daily over that time.  CNS imaging demonstrated large parieto-occipital mass with herniation syndrome.  Mass was resected/debulked, and  post-operative course was complicated by intracranial hematoma and also respiratory failure.  At present, he is walking and talking, carries left sided visual impairment.  Currently on antibiotics for wound infection per Dr. OZada Finders  Not requiring steroids.  Currently on lamictal 555mdaily.  Medications: Current Outpatient Medications on File Prior to Visit  Medication Sig Dispense Refill  . lamoTRIgine (LAMICTAL) 200 MG tablet Take 20041mn AM, 250m75m PM. 60 tablet 2  . lamoTRIgine (LAMICTAL) 25 MG tablet Take 10 tablets (250 mg total) by mouth at bedtime. Take 2 tablets along with a 200 mg at bedtime. 30 tablet 3  . sertraline (ZOLOFT) 50 MG tablet Take 1 tablet by mouth once daily 30 tablet 0  . traZODone (DESYREL) 50 MG tablet Take 1 tablet (50 mg total) by mouth at bedtime as needed for sleep. 30 tablet 2  . ondansetron (ZOFRAN) 8 MG tablet Take 1 tablet (8 mg total) by mouth 2 (two) times daily as needed (nausea and vomiting). May take 30-60 minutes prior to Temodar administration if nausea/vomiting occurs. (Patient not taking: Reported on 04/24/2020) 30 tablet 1  . temozolomide (TEMODAR) 100 MG capsule Take 200 mg by mouth at bedtime. X 5 days  Total dose 340 mg x 5 days then off repeat each cycle May take on an empty stomach or at bedtime to decrease nausea & vomiting. (Patient not taking: Reported on 04/24/2020)     No current facility-administered medications on file prior to visit.    Allergies: No Known Allergies Past Medical History:  Past Medical History:  Diagnosis Date  . ADD (attention deficit disorder)   . ADHD   . Bifrontal oligodendroglioma (H[facility])[place].  Headache   . Psychotic disorder ([place])   . Seizures ([place])    Past Surgical History:  Past Surgical History:  Procedure Laterality Date  . APPLICATION OF CRANIAL NAVIGATION N/A 07/02/2019   Procedure: APPLICATION OF CRANIAL NAVIGATION;  Surgeon: Judith Part, MD;  Location: [facility];  Service: Neurosurgery;   Laterality: N/A;  . CRANIOTOMY N/A 07/03/2019   Procedure: CRANIOTOMY HEMATOMA EVACUATION SUBDURAL;  Surgeon: Judith Part, MD;  Location: [facility];  Service: Neurosurgery;  Laterality: N/A;  . CRANIOTOMY Right 07/03/2019   Procedure: CRANIOTOMY HEMATOMA EVACUATION SUBDURAL;  Surgeon: Judith Part, MD;  Location: [facility];  Service: Neurosurgery;  Laterality: Right;  . CRANIOTOMY Right 07/02/2019   Procedure: CRANIOTOMY TUMOR EXCISION with Lucky Rathke;  Surgeon: Judith Part, MD;  Location: [facility];  Service: Neurosurgery;  Laterality: Right;  posterior   Social History:  Social History   Socioeconomic History  . Marital status: Single    Spouse name: Not on file  . Number of children: Not on file  . Years of education: Not on file  . Highest education level: Not on file  Occupational History  . Not on file  Tobacco Use  . Smoking status: Current Every Day Smoker    Packs/day: 1.00    Years: 12.00    Pack years: 12.00  . Smokeless tobacco: Current User    Types: Chew  Vaping Use  . Vaping Use: Never used  Substance and Sexual Activity  . Alcohol use: Yes    Comment: last drink: "3 nights ago" when he had "1/4th of a bottle," has been drinking daily for several weeks from a "few beers to a bottle of liquor"  . Drug use: Yes    Types: Cocaine    Comment: last use "couple of weeks ago" when he had "2 lines,"  frequency "once in a blue moon"  . Sexual activity: Not Currently  Other Topics Concern  . Not on file  Social History Narrative  . Not on file   Social Determinants of Health   Financial Resource Strain: Not on file  Food Insecurity: Not on file  Transportation Needs: Not on file  Physical Activity: Not on file  Stress: Not on file  Social Connections: Not on file  Intimate Partner Violence: Not on file   Family History: No family history on file.  Review of Systems: Constitutional: Doesn't report fevers, chills or abnormal weight loss Eyes: Doesn't  report blurriness of vision Ears, nose, mouth, throat, and face: Doesn't report sore throat Respiratory: Doesn't report cough, dyspnea or wheezes Cardiovascular: Doesn't report palpitation, chest discomfort  Gastrointestinal:  Doesn't report nausea, constipation, diarrhea GU: Doesn't report incontinence Skin: Doesn't report skin rashes Neurological: Per HPI Musculoskeletal: Doesn't report joint pain Behavioral/Psych: Doesn't report anxiety  Physical Exam: Vitals:   04/24/20 1206  BP: (!) 137/96  Pulse: 92  Resp: 18  Temp: 97.8 F (36.6 C)  SpO2: 99%   KPS: 80. General: Alert, cooperative, pleasant, in no acute distress Head: Normal EENT: No conjunctival injection or scleral icterus.  Lungs: Resp effort normal Cardiac: Regular rate Abdomen: Non-distended abdomen Skin: No rashes cyanosis or petechiae. Extremities: No clubbing or edema  Neurologic Exam: Mental Status: Awake, alert, attentive to examiner. Oriented to self and environment. Language is fluent with intact comprehension.  Cranial Nerves: Visual acuity is grossly normal. Left homonymous hemianopia. Extra-ocular movements intact. No ptosis. Face is symmetric Motor: Tone and bulk are normal. Power is full in both arms and  legs. Reflexes are symmetric, no pathologic reflexes present.  Sensory: Intact to light touch Gait: Sensory dystaxia   Labs: I have reviewed the data as listed    Component Value Date/Time   NA 138 02/20/2020 1204   K 3.7 02/20/2020 1204   CL 102 02/20/2020 1204   CO2 23 02/20/2020 1204   GLUCOSE 100 (H) 02/20/2020 1204   BUN 14 02/20/2020 1204   CREATININE 0.93 02/20/2020 1204   CALCIUM 9.5 02/20/2020 1204   PROT 7.5 02/20/2020 1204   ALBUMIN 4.4 02/20/2020 1204   AST 24 02/20/2020 1204   ALT 26 02/20/2020 1204   ALKPHOS 78 02/20/2020 1204   BILITOT 1.0 02/20/2020 1204   GFRNONAA >60 02/20/2020 1204   GFRAA >60 12/05/2019 1206   Lab Results  Component Value Date   WBC 7.0  02/20/2020   NEUTROABS 5.2 02/20/2020   HGB 16.0 02/20/2020   HCT 43.8 02/20/2020   MCV 93.2 02/20/2020   PLT 184 02/20/2020   Imaging:  [facility] Clinician Interpretation: I have personally reviewed the CNS images as listed.  My interpretation, in the context of the patient's clinical presentation, is treatment effect vs true progression  MR Brain W Wo Contrast  Result Date: 04/15/2020 CLINICAL DATA:  History of oligodendroglioma. Assess treatment response. EXAM: MRI HEAD WITHOUT AND WITH CONTRAST TECHNIQUE: Multiplanar, multiecho pulse sequences of the brain and surrounding structures were obtained without and with intravenous contrast. CONTRAST:  24m GADAVIST GADOBUTROL 1 MMOL/ML IV SOLN COMPARISON:  Head CT January 04, 2019; head MRI December 12, 2019. FINDINGS: Brain: Postsurgical changes from resection of large right paramedian parietooccipital lesion with surgical cavity in the medial aspect of the inferior right parietal lobe and right occipital lobe. Surrounding T2 hyperintensity extending to the posterior right frontal lobe and posterior and medial right temporal lobe appear unchanged. There is retraction of the atrium and temporal horn of the right lateral ventricle as well as prominence of the parietal occipital sulci and prominent right hippocampal volume loss. Residual foci of contrast enhancement in the right periatrial region (series 19, image 91) and subcortical right parietal lobe (series 19, image 109) have further decreased in size. A new punctate focus of faint contrast enhancement is seen within the area of T2 hyperintensity in the right occipital lobe (series 19, image 102). Mild interval improvement of the small right convexity subdural fluid collection measuring up to 5 mm (6-7 mm on prior). No acute infarct, new hemorrhage or midline shift. Vascular: Normal flow voids. Skull and upper cervical spine: Postsurgical changes from left occipital craniotomy. No focal marrow lesion  identified. Sinuses/Orbits: Negative. IMPRESSION: Postsurgical changes with stable appearance of right parietooccipital surgical cavity with new focus of faint contrast enhancement within the area of T2 hyperintensity in the right occipital lobe, as described above. Short-term follow-up suggested. The area of T2 hyperintensity surrounding the surgical cavity appear otherwise stable. Electronically Signed   By: KPedro EarlsM.D.   On: 04/15/2020 10:22    Assessment/Plan 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[place] [C71.4]   Harshan L Tarnow is clinically stable today, still having completed #3 cycles of adjuvant Temozolomide.  MRI demonstrates small enhancing nodule which is new, etiology is either treatment effect or very early tumor progression (favor the former).    He will be seeing mental health practice provider later today, we encouraged continued regular follow up.  We recommended continuing treatment with cycle #4 Temozolomide, 207mm2, on for five days and off for twenty three days in  twenty eight day cycles. The patient will have a complete blood count performed on days 21 and 28 of each cycle, and a comprehensive metabolic panel performed on day 28 of each cycle. Labs may need to be performed more often. Zofran will prescribed for home use for nausea/vomiting.   Chemotherapy should be held for the following:  ANC less than 1,000  Platelets less than 100,000  LFT or creatinine greater than 2x ULN  If clinical concerns/contraindications develop  We recommended continuing Lamictal 200/250.  We ask that Tajah Schreiner Renk return to clinic virtually in 1 month, and ask that he have labs performed locally in [place] prior to cycle #5.  He will return to [facility] for MRI and April visit.  All questions were answered. The patient knows to call the clinic with any problems, questions or concerns. No barriers to learning were detected.  I have spent a total of 30  minutes of face-to-face and non-face-to-face time, excluding clinical staff time, preparing to see patient, ordering tests and/or medications, counseling the patient, and independently interpreting results and communicating results to the patient/family/caregiver    [name], MD Medical Director of Neuro-Oncology [facility] [place] 04/24/20 12:12 PM

## 2020-04-28 ENCOUNTER — Other Ambulatory Visit (HOSPITAL_COMMUNITY): Payer: Self-pay | Admitting: Psychiatry

## 2020-04-28 DIAGNOSIS — F411 Generalized anxiety disorder: Secondary | ICD-10-CM

## 2020-04-29 ENCOUNTER — Telehealth (HOSPITAL_COMMUNITY): Payer: Self-pay | Admitting: *Deleted

## 2020-04-29 ENCOUNTER — Other Ambulatory Visit (HOSPITAL_COMMUNITY): Payer: Self-pay | Admitting: Physician Assistant

## 2020-04-29 DIAGNOSIS — F411 Generalized anxiety disorder: Secondary | ICD-10-CM

## 2020-04-29 MED ORDER — SERTRALINE HCL 50 MG PO TABS
50.0000 mg | ORAL_TABLET | Freq: Every day | ORAL | 0 refills | Status: DC
Start: 2020-04-29 — End: 2020-05-12

## 2020-04-29 NOTE — Telephone Encounter (Signed)
Provider was contacted by Eliezer Lofts, RMA regarding refill on patient's medication. Patient's medication to be e-prescribed to pharmacy of choice.

## 2020-04-29 NOTE — Progress Notes (Signed)
Provider was contacted by Eliezer Lofts, RMA regarding refill on patient's medication. Patient's medication to be e-prescribed to pharmacy of choice.

## 2020-04-29 NOTE — Telephone Encounter (Signed)
sertraline (ZOLOFT) 50 MG tablet 30 tablet 0 03/30/2020    Sig: Take 1 tablet by mouth once daily

## 2020-05-12 ENCOUNTER — Telehealth (HOSPITAL_COMMUNITY): Payer: Self-pay | Admitting: *Deleted

## 2020-05-12 ENCOUNTER — Other Ambulatory Visit: Payer: Self-pay

## 2020-05-12 ENCOUNTER — Encounter (HOSPITAL_COMMUNITY): Payer: Self-pay | Admitting: Psychiatry

## 2020-05-12 ENCOUNTER — Telehealth (INDEPENDENT_AMBULATORY_CARE_PROVIDER_SITE_OTHER): Payer: Medicaid Other | Admitting: Psychiatry

## 2020-05-12 DIAGNOSIS — F251 Schizoaffective disorder, depressive type: Secondary | ICD-10-CM | POA: Diagnosis not present

## 2020-05-12 DIAGNOSIS — F411 Generalized anxiety disorder: Secondary | ICD-10-CM

## 2020-05-12 MED ORDER — LAMOTRIGINE 25 MG PO TABS: 250.0000 mg | ORAL_TABLET | Freq: Every day | ORAL | 3 refills | Status: DC

## 2020-05-12 MED ORDER — LAMOTRIGINE 200 MG PO TABS: ORAL_TABLET | ORAL | 2 refills | Status: DC

## 2020-05-12 MED ORDER — OLANZAPINE 7.5 MG PO TABS: 7.5000 mg | ORAL_TABLET | Freq: Every day | ORAL | 2 refills | Status: DC

## 2020-05-12 MED ORDER — TRAZODONE HCL 100 MG PO TABS: 100.0000 mg | ORAL_TABLET | Freq: Every evening | ORAL | 2 refills | Status: DC | PRN

## 2020-05-12 MED ORDER — SERTRALINE HCL 50 MG PO TABS: 50.0000 mg | ORAL_TABLET | Freq: Every day | ORAL | 2 refills | Status: DC

## 2020-05-12 MED ORDER — SERTRALINE HCL 50 MG PO TABS: 50.0000 mg | ORAL_TABLET | Freq: Every day | ORAL | 0 refills | Status: DC

## 2020-05-12 NOTE — Progress Notes (Signed)
[facility] MD/PA/NP OP Progress Note Virtual Visit via Telephone Note  I connected with [place] on 05/12/20 at  2:00 PM EST by telephone and verified that I am speaking with the correct person using two identifiers.  Location: Patient: home Provider: Clinic   I discussed the limitations, risks, security and privacy concerns of performing an evaluation and management service by telephone and the availability of in person appointments. I also discussed with the patient that there may be a patient responsible charge related to this service. The patient expressed understanding and agreed to proceed.  I provided 30 minutes of non-face-to-face time during this encounter.   05/12/2020 2:47 PM Stephen Beard  MRN:  [id number]  Chief Complaint: " Things has been going well.  The Zoloft really works"  HPI: 25 year old male seen today for follow up psychiatricevaluation.He has a psychiatric history of , schizoaffective disorder depressive type, anxiety, depression, SI, and ADHD. He is currently managed on Zyprexa 7.5 mg daily, trazodone 25 to 50 mg as needed Lamictal 200 mg daily, and Lamictal 250 mg at night.  He noted that his medications are effective in managing his psychiatric conditions.    Today patient unable to log on virtually so his exam was over the phone.  During assessment he was pleasant, cooperative, and engaged in conversation.  He informed provider that recently he moved to [place] and notes that he is trying to set up psychiatric care there.  He notes that he feels that he is mentally stable and notes that his anxiety and depression has improved greatly.  Provider conducted a GAD-7 and patient scored a 7, at his last visit he scored 18.  Provider also conducted a PHQ-9 and patient scored a 5, at his last visit he scored a 19.  Patient notes that he discontinued Zyprexa because he thought it was similar to trazodone.  He notes that he continues to have auditory  hallucinations.  He also notes that his sleep has improved however notes that he feels like it could be better as she sleeps 5 to 6 hours nightly.  He endorses adequate appetite.  He denies SI/HI or paranoia.   Patient notes that he restarted his chemo and notes that things are going well.  At times he notes that he worries about his health but overall reports that he is doing well.  Today he is agreeable to restarting Zyprexa 7.5 mg to help manage symptoms of psychosis and mood.  He is also agreeable to increasing trazodone 25 mg -50 mg 100 mg as needed nightly to help manage sleep.  He will continue all other medications as prescribed.  No other concerns noted at this time. Visit Diagnosis:    ICD-10-CM   1. Schizoaffective disorder, depressive type ([place])  F25.1 traZODone (DESYREL) 100 MG tablet    lamoTRIgine (LAMICTAL) 200 MG tablet    lamoTRIgine (LAMICTAL) 25 MG tablet    OLANZapine (ZYPREXA) 7.5 MG tablet  2. Generalized anxiety disorder  F41.1 sertraline (ZOLOFT) 50 MG tablet    Past Psychiatric History: schizoaffective disorder depressive type, anxiety, depression, SI, and ADHD  Past Medical History:  Past Medical History:  Diagnosis Date  . ADD (attention deficit disorder)   . ADHD   . 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 Headache   . Psychotic disorder ([place])   . Seizures ([place])     Past Surgical History:  Procedure Laterality Date  . APPLICATION OF CRANIAL NAVIGATION N/A 07/02/2019   Procedure: APPLICATION  OF CRANIAL NAVIGATION;  Surgeon: Judith Part, MD;  Location: [place];  Service: Neurosurgery;  Laterality: N/A;  . CRANIOTOMY N/A 07/03/2019   Procedure: CRANIOTOMY HEMATOMA EVACUATION SUBDURAL;  Surgeon: Judith Part, MD;  Location: [place];  Service: Neurosurgery;  Laterality: N/A;  . CRANIOTOMY Right 07/03/2019   Procedure: CRANIOTOMY HEMATOMA EVACUATION SUBDURAL;  Surgeon: Judith Part, MD;  Location: [place];  Service: Neurosurgery;  Laterality: Right;   . CRANIOTOMY Right 07/02/2019   Procedure: CRANIOTOMY TUMOR EXCISION with Lucky Rathke;  Surgeon: Judith Part, MD;  Location: [place];  Service: Neurosurgery;  Laterality: Right;  posterior    Family Psychiatric History: Denies  Family History: History reviewed. No pertinent family history.  Social History:  Social History   Socioeconomic History  . Marital status: Single    Spouse name: Not on file  . Number of children: Not on file  . Years of education: Not on file  . Highest education level: Not on file  Occupational History  . Not on file  Tobacco Use  . Smoking status: Current Every Day Smoker    Packs/day: 1.00    Years: 12.00    Pack years: 12.00  . Smokeless tobacco: Current User    Types: Chew  Vaping Use  . Vaping Use: Never used  Substance and Sexual Activity  . Alcohol use: Yes    Comment: last drink: "3 nights ago" when he had "1/4th of a bottle," has been drinking daily for several weeks from a "few beers to a bottle of liquor"  . Drug use: Yes    Types: Cocaine    Comment: last use "couple of weeks ago" when he had "2 lines,"  frequency "once in a blue moon"  . Sexual activity: Not Currently  Other Topics Concern  . Not on file  Social History Narrative  . Not on file   Social Determinants of Health   Financial Resource Strain: Not on file  Food Insecurity: Not on file  Transportation Needs: Not on file  Physical Activity: Not on file  Stress: Not on file  Social Connections: Not on file    Allergies: No Known Allergies  Metabolic Disorder Labs: No results found for: HGBA1C, MPG No results found for: PROLACTIN Lab Results  Component Value Date   TRIG 49 07/03/2019   Lab Results  Component Value Date   TSH 1.410 01/08/2020    Therapeutic Level Labs: No results found for: LITHIUM No results found for: VALPROATE No components found for:  CBMZ  Current Medications: Current Outpatient Medications  Medication Sig Dispense Refill  .  OLANZapine (ZYPREXA) 7.5 MG tablet Take 1 tablet (7.5 mg total) by mouth at bedtime. 30 tablet 2  . lamoTRIgine (LAMICTAL) 200 MG tablet Take 200mg  in AM, 250mg  in PM. 60 tablet 2  . lamoTRIgine (LAMICTAL) 25 MG tablet Take 10 tablets (250 mg total) by mouth at bedtime. Take 2 tablets along with a 200 mg at bedtime. 30 tablet 3  . ondansetron (ZOFRAN) 8 MG tablet Take 1 tablet (8 mg total) by mouth 2 (two) times daily as needed (nausea and vomiting). May take 30-60 minutes prior to Temodar administration if nausea/vomiting occurs. (Patient not taking: Reported on 04/24/2020) 30 tablet 1  . sertraline (ZOLOFT) 50 MG tablet Take 1 tablet (50 mg total) by mouth daily. 30 tablet 0  . temozolomide (TEMODAR) 100 MG capsule Take 200 mg by mouth at bedtime. X 5 days  Total dose 340 mg x 5  days then off repeat each cycle May take on an empty stomach or at bedtime to decrease nausea & vomiting. (Patient not taking: Reported on 04/24/2020)    . traZODone (DESYREL) 100 MG tablet Take 1 tablet (100 mg total) by mouth at bedtime as needed for sleep. 30 tablet 2   No current facility-administered medications for this visit.     Musculoskeletal: Strength & Muscle Tone: Unable to assess due to telephone visit Gait & Station: Unable to assess due to telephone visit Patient leans: N/A  Psychiatric Specialty Exam: Review of Systems  There were no vitals taken for this visit.There is no height or weight on file to calculate BMI.  General Appearance: Unable to assess due to telephone visit  Eye Contact:  Unable to assess due to telephone visit  Speech:  Clear and Coherent and Normal Rate  Volume:  Normal  Mood:  Euthymic  Affect:  Appropriate and Congruent  Thought Process:  Coherent, Goal Directed and Linear  Orientation:  Full (Time, Place, and Person)  Thought Content: Logical and Hallucinations: Auditory   Suicidal Thoughts:  No  Homicidal Thoughts:  No  Memory:  Immediate;   Good Recent;    Good Remote;   Good  Judgement:  Good  Insight:  Good  Psychomotor Activity:  Normal  Concentration:  Concentration: Good and Attention Span: Good  Recall:  Good  Fund of Knowledge: Good  Language: Good  Akathisia:  No  Handed:  Right  AIMS (if indicated): Not done  Assets:  Communication Skills Desire for Improvement Housing Leisure Time Social Support  ADL's:  Intact  Cognition: WNL  Sleep:  Fair   Screenings: AIMS   Flowsheet Row Admission (Discharged) from 01/07/2020 in [facility]  AIMS Total Score 0    AUDIT   Flowsheet Row Admission (Discharged) from 01/07/2020 in [facility]  Alcohol Use Disorder Identification Test Final Score (AUDIT) 14    CAGE-AID   [place] Office Visit from 01/01/2020 in [facility]  CAGE-AID Score 3    GAD-7   Flowsheet Row Video Visit from 05/12/2020 in [facility] Video Visit from 02/12/2020 in [facility] Office Visit from 01/01/2020 in [facility]  Total GAD-7 Score 7 18 20     PHQ2-9   Flowsheet Row Video Visit from 05/12/2020 in [facility] Video Visit from 02/12/2020 in [facility] Office Visit from 01/01/2020 in Center  PHQ-2 Total Score 2 4 6   PHQ-9 Total Score 5 19 23     Flowsheet Row Admission (Discharged) from 01/07/2020 in [facility] ED to Hosp-Admission (Discharged) from 01/03/2020 in [facility]  C-SSRS RISK CATEGORY High Risk High Risk       Assessment and Plan: Patient notes his auditory hallucination and poor sleep.  He notes his anxiety and depression has improved however. Today he is agreeable to restarting Zyprexa 7.5 mg to help manage symptoms of psychosis and mood.  He is also agreeable to  increasing trazodone 25 mg -50 mg 100 mg as needed nightly to help manage sleep.  He will continue all other medications as prescribed.  1. Schizoaffective disorder, depressive type ([place])  Increased- traZODone (DESYREL) 100 MG tablet; Take 1 tablet (100 mg total) by mouth at bedtime as needed for sleep.  Dispense: 30 tablet; Refill: 2  Continue- lamoTRIgine (LAMICTAL) 200 MG tablet; Take 200mg  in AM, 250mg  in PM.  Dispense: 60 tablet; Refill: 2 Continue- lamoTRIgine (LAMICTAL) 25 MG tablet; Take 10 tablets (250 mg total) by mouth at bedtime. Take 2 tablets along with a 200 mg at bedtime.  Dispense: 30 tablet; Refill: 3 Restart- OLANZapine (ZYPREXA) 7.5 MG tablet; Take 1 tablet (7.5 mg total) by mouth at bedtime.  Dispense: 30 tablet; Refill: 2  2. Generalized anxiety disorder  Continue- sertraline (ZOLOFT) 50 MG tablet; Take 1 tablet (50 mg total) by mouth daily.  Dispense: 30 tablet; Refill: 2  Follow-up in 3 months  Salley Slaughter, NP 05/12/2020, 2:47 PM

## 2020-05-12 NOTE — Telephone Encounter (Signed)
Rx REQUESTED CLARIFICATION ON Continue- lamoTRIgine (LAMICTAL) 25 MG tablet; Take 10 tablets (250 mg total) by mouth at bedtime. Take 2 tablets along with a 200 mg at bedtime.  Dispense: 30 tablet; Refill: 3.  COMES IN  100'S & 50'S

## 2020-05-13 ENCOUNTER — Other Ambulatory Visit (HOSPITAL_COMMUNITY): Payer: Self-pay | Admitting: Psychiatry

## 2020-05-13 DIAGNOSIS — F251 Schizoaffective disorder, depressive type: Secondary | ICD-10-CM

## 2020-05-13 MED ORDER — LAMOTRIGINE 25 MG PO TABS: 50.0000 mg | ORAL_TABLET | Freq: Every day | ORAL | 3 refills | Status: DC

## 2020-05-13 MED ORDER — LAMOTRIGINE 200 MG PO TABS: ORAL_TABLET | ORAL | 2 refills | Status: DC

## 2020-05-13 NOTE — Telephone Encounter (Signed)
Patient takes 200 mg in the morning and 250 mg at night.  Provider filled Lamictal 200 mg #60 and Lamictal 50 mg (two 25 mg tablets) # 60.  Patient was instructed to take one 200 mg tablet plus to 25 mg tablets to total 250 at night.  Order refilled and sent to preferred pharmacy.

## 2020-05-26 ENCOUNTER — Telehealth: Payer: Self-pay

## 2020-05-26 NOTE — Telephone Encounter (Signed)
Spoke with pt regarding his upcoming appt on 05/28/20. Pt verbalizes understanding of his appt information and confirms that it is a phone visit with MD Vaslow on 05/28/20.

## 2020-05-28 ENCOUNTER — Inpatient Hospital Stay: Payer: Medicaid Other | Attending: Internal Medicine | Admitting: Internal Medicine

## 2020-05-28 ENCOUNTER — Other Ambulatory Visit: Payer: Self-pay | Admitting: Internal Medicine

## 2020-05-28 ENCOUNTER — Inpatient Hospital Stay: Payer: Medicaid Other

## 2020-05-28 ENCOUNTER — Other Ambulatory Visit: Payer: Self-pay

## 2020-05-28 VITALS — BP 111/76 | HR 58 | Temp 97.5°F | Resp 11

## 2020-05-28 DIAGNOSIS — C714 Malignant neoplasm of occipital lobe: Secondary | ICD-10-CM | POA: Insufficient documentation

## 2020-05-28 DIAGNOSIS — R569 Unspecified convulsions: Secondary | ICD-10-CM | POA: Insufficient documentation

## 2020-05-28 DIAGNOSIS — Z79899 Other long term (current) drug therapy: Secondary | ICD-10-CM | POA: Diagnosis not present

## 2020-05-28 DIAGNOSIS — F17210 Nicotine dependence, cigarettes, uncomplicated: Secondary | ICD-10-CM | POA: Insufficient documentation

## 2020-05-28 LAB — CMP (CANCER CENTER ONLY)
ALT: 23 U/L (ref 0–44)
AST: 16 U/L (ref 15–41)
Albumin: 4.4 g/dL (ref 3.5–5.0)
Alkaline Phosphatase: 73 U/L (ref 38–126)
Anion gap: 8 (ref 5–15)
BUN: 12 mg/dL (ref 6–20)
CO2: 22 mmol/L (ref 22–32)
Calcium: 8.6 mg/dL — ABNORMAL LOW (ref 8.9–10.3)
Chloride: 107 mmol/L (ref 98–111)
Creatinine: 0.78 mg/dL (ref 0.61–1.24)
GFR, Estimated: >60 mL/min (ref >60)
Glucose, Bld: 101 mg/dL — ABNORMAL HIGH (ref 70–99)
Potassium: 3.9 mmol/L (ref 3.5–5.1)
Sodium: 137 mmol/L (ref 135–145)
Total Bilirubin: 0.4 mg/dL (ref 0.3–1.2)
Total Protein: 6.8 g/dL (ref 6.5–8.1)

## 2020-05-28 LAB — CBC WITH DIFFERENTIAL (CANCER CENTER ONLY)
Abs Immature Granulocytes: 0.02 10*3/uL (ref 0.00–0.07)
Basophils Absolute: 0 10*3/uL (ref 0.0–0.1)
Basophils Relative: 0 %
Eosinophils Absolute: 0 10*3/uL (ref 0.0–0.5)
Eosinophils Relative: 1 %
HCT: 42.3 % (ref 39.0–52.0)
Hemoglobin: 15.1 g/dL (ref 13.0–17.0)
Immature Granulocytes: 0 %
Lymphocytes Relative: 24 %
Lymphs Abs: 1.4 10*3/uL (ref 0.7–4.0)
MCH: 34 pg (ref 26.0–34.0)
MCHC: 35.7 g/dL (ref 30.0–36.0)
MCV: 95.3 fL (ref 80.0–100.0)
Monocytes Absolute: 0.4 10*3/uL (ref 0.1–1.0)
Monocytes Relative: 8 %
Neutro Abs: 3.8 10*3/uL (ref 1.7–7.7)
Neutrophils Relative %: 67 %
Platelet Count: 147 10*3/uL — ABNORMAL LOW (ref 150–400)
RBC: 4.44 MIL/uL (ref 4.22–5.81)
RDW: 11.3 % — ABNORMAL LOW (ref 11.5–15.5)
WBC Count: 5.7 10*3/uL (ref 4.0–10.5)
nRBC: 0 % (ref 0.0–0.2)

## 2020-05-28 MED ORDER — TEMOZOLOMIDE 140 MG PO CAPS: 140.0000 mg | ORAL_CAPSULE | Freq: Every day | ORAL | 0 refills | Status: DC

## 2020-05-28 MED ORDER — TEMOZOLOMIDE 100 MG PO CAPS: 200.0000 mg | ORAL_CAPSULE | Freq: Every day | ORAL | 0 refills | Status: DC

## 2020-05-28 NOTE — Progress Notes (Signed)
[name] [place], [place] [address] [phone]   Interval Evaluation  Date of Service: 05/28/20 Patient Name: Stephen Beard Patient MRN: [id number] Patient DOB: 28-Mar-1995 Provider: [name], MD  Identifying Statement:  Stephen Beard is a 25 y.o. 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 Zada Finders. Followed by hematoma evacuation on 07/03/19.   08/05/2019 - 09/17/2019 Radiation Therapy   IMRT with concurrent Temodar 74m/m2 daily   10/15/2019 -  Chemotherapy    Patient is on Treatment Plan: BRAIN ANAPLASTIC GLIOMA GRADE III TEMOZOLOMIDE POST XRT Q28D        Biomarkers:  MGMT Unknown.  IDH 1/2 Mutated.  EGFR Unknown  1p/19q co-deleted   Interval History:  Stephen Beard presents today for follow up after completing cycle #4 of TMZ.  Vision continues to improve after keratitis.  He feels no new or progressive neurologic deficits.  No seizures, occassional tension headaches only.  [place] doing [place] now in [place] living with [place] grandmother.    H+P (07/19/19) Patient presented to medical attention with several months of progressive headaches and left sided visual impairment.  This was accompanied by episodes of deja-vu +paroxysmal burning smells which would occur almost daily over that time.  CNS imaging demonstrated large parieto-occipital mass with herniation syndrome.  Mass was resected/debulked, and post-operative course was complicated by intracranial hematoma and also respiratory failure.  At present, he is walking and talking, carries left sided visual impairment.  Currently on antibiotics for wound infection per Dr. OZada Finders  Not requiring steroids.  Currently on lamictal 531mdaily.  Medications: Current Outpatient Medications on File Prior to  Visit  Medication Sig Dispense Refill  . lamoTRIgine (LAMICTAL) 200 MG tablet Take 20086mn AM, 250m28m PM. 60 tablet 2  . lamoTRIgine (LAMICTAL) 25 MG tablet Take 2 tablets (50 mg total) by mouth at bedtime. Take 2 tablets along with a 200 mg at bedtime. 60 tablet 3  . OLANZapine (ZYPREXA) 7.5 MG tablet Take 1 tablet (7.5 mg total) by mouth at bedtime. 30 tablet 2  . sertraline (ZOLOFT) 50 MG tablet Take 1 tablet (50 mg total) by mouth daily. 30 tablet 2  . temozolomide (TEMODAR) 100 MG capsule Take 200 mg by mouth at bedtime. X 5 days  Total dose 340 mg x 5 days then off repeat each cycle May take on an empty stomach or at bedtime to decrease nausea & vomiting.    . traZODone (DESYREL) 100 MG tablet Take 1 tablet (100 mg total) by mouth at bedtime as needed for sleep. 30 tablet 2  . ondansetron (ZOFRAN) 8 MG tablet Take 1 tablet (8 mg total) by mouth 2 (two) times daily as needed (nausea and vomiting). May take 30-60 minutes prior to Temodar administration if nausea/vomiting occurs. (Patient not taking: No sig reported) 30 tablet 1   No current facility-administered medications on file prior to visit.    Allergies: Not on File Past Medical History:  Past Medical History:  Diagnosis Date  . ADD (attention deficit disorder)   . ADHD   . Bifrontal oligodendroglioma (HCC)[place]. Headache   . Psychotic disorder (HCC)[place]. Seizures (HCC)[place] Past Surgical History:  Past Surgical History:  Procedure Laterality Date  . APPLICATION OF CRANIAL NAVIGATION N/A 07/02/2019  Procedure: APPLICATION OF CRANIAL NAVIGATION;  Surgeon: Judith Part, MD;  Location: [place];  Service: Neurosurgery;  Laterality: N/A;  . CRANIOTOMY N/A 07/03/2019   Procedure: CRANIOTOMY HEMATOMA EVACUATION SUBDURAL;  Surgeon: Judith Part, MD;  Location: [place];  Service: Neurosurgery;  Laterality: N/A;  . CRANIOTOMY Right 07/03/2019   Procedure: CRANIOTOMY HEMATOMA EVACUATION SUBDURAL;  Surgeon: Judith Part,  MD;  Location: [place];  Service: Neurosurgery;  Laterality: Right;  . CRANIOTOMY Right 07/02/2019   Procedure: CRANIOTOMY TUMOR EXCISION with Lucky Rathke;  Surgeon: Judith Part, MD;  Location: [place];  Service: Neurosurgery;  Laterality: Right;  posterior   Social History:  Social History   Socioeconomic History  . Marital status: Single    Spouse name: Not on file  . Number of children: Not on file  . Years of education: Not on file  . Highest education level: Not on file  Occupational History  . Not on file  Tobacco Use  . Smoking status: Current Every Day Smoker    Packs/day: 1.00    Years: 12.00    Pack years: 12.00  . Smokeless tobacco: Current User    Types: Chew  Vaping Use  . Vaping Use: Never used  Substance and Sexual Activity  . Alcohol use: Yes    Comment: last drink: "3 nights ago" when he had "1/4th of a bottle," has been drinking daily for several weeks from a "few beers to a bottle of liquor"  . Drug use: Yes    Types: Cocaine    Comment: last use "couple of weeks ago" when he had "2 lines,"  frequency "once in a blue moon"  . Sexual activity: Not Currently  Other Topics Concern  . Not on file  Social History Narrative  . Not on file   Social Determinants of Health   Financial Resource Strain: Not on file  Food Insecurity: Not on file  Transportation Needs: Not on file  Physical Activity: Not on file  Stress: Not on file  Social Connections: Not on file  Intimate Partner Violence: Not on file   Family History: No family history on file.  Review of Systems: Constitutional: Doesn't report fevers, chills or abnormal weight loss Eyes: Doesn't report blurriness of vision Ears, nose, mouth, throat, and face: Doesn't report sore throat Respiratory: Doesn't report cough, dyspnea or wheezes Cardiovascular: Doesn't report palpitation, chest discomfort  Gastrointestinal:  Doesn't report nausea, constipation, diarrhea GU: Doesn't report incontinence Skin:  Doesn't report skin rashes Neurological: Per HPI Musculoskeletal: Doesn't report joint pain Behavioral/Psych: Doesn't report anxiety  Physical Exam: Vitals:   05/28/20 1252  BP: 111/76  Pulse: (!) 58  Resp: 11  Temp: (!) 97.5 F (36.4 C)  SpO2: 100%   KPS: 80. General: Alert, cooperative, pleasant, in no acute distress Head: Normal EENT: No conjunctival injection or scleral icterus.  Lungs: Resp effort normal Cardiac: Regular rate Abdomen: Non-distended abdomen Skin: No rashes cyanosis or petechiae. Extremities: No clubbing or edema  Neurologic Exam: Mental Status: Awake, alert, attentive to examiner. Oriented to self and environment. Language is fluent with intact comprehension.  Cranial Nerves: Visual acuity is grossly normal. Left homonymous hemianopia. Extra-ocular movements intact. No ptosis. Face is symmetric Motor: Tone and bulk are normal. Power is full in both arms and legs. Reflexes are symmetric, no pathologic reflexes present.  Sensory: Intact to light touch Gait: Sensory dystaxia   Labs: I have reviewed the data as listed    Component Value Date/Time  NA 138 02/20/2020 1204   K 3.7 02/20/2020 1204   CL 102 02/20/2020 1204   CO2 23 02/20/2020 1204   GLUCOSE 100 (H) 02/20/2020 1204   BUN 14 02/20/2020 1204   CREATININE 0.93 02/20/2020 1204   CALCIUM 9.5 02/20/2020 1204   PROT 7.5 02/20/2020 1204   ALBUMIN 4.4 02/20/2020 1204   AST 24 02/20/2020 1204   ALT 26 02/20/2020 1204   ALKPHOS 78 02/20/2020 1204   BILITOT 1.0 02/20/2020 1204   GFRNONAA >60 02/20/2020 1204   GFRAA >60 12/05/2019 1206   Lab Results  Component Value Date   WBC 5.7 05/28/2020   NEUTROABS 3.8 05/28/2020   HGB 15.1 05/28/2020   HCT 42.3 05/28/2020   MCV 95.3 05/28/2020   PLT 147 (L) 05/28/2020    Assessment/Plan Focal seizures ([place]) [R56.9]   Stephen Beard is clinically stable today, now having completed 4 cycles of adjuvant Temozolomide.    We recommended  continuing treatment with cycle #5 Temozolomide, 290m/m2, on for five days and off for twenty three days in twenty eight day cycles. The patient will have a complete blood count performed on days 21 and 28 of each cycle, and a comprehensive metabolic panel performed on day 28 of each cycle. Labs may need to be performed more often. Zofran will prescribed for home use for nausea/vomiting.   Chemotherapy should be held for the following:  ANC less than 1,000  Platelets less than 100,000  LFT or creatinine greater than 2x ULN  If clinical concerns/contraindications develop  We will consider transitioning to imaging surveillance after cycle #6 if imaging remains stable.  We recommended continuing Lamictal 200/250.  We ask that BJuwon Beard return to clinic in 1 months following next brain MRI, or sooner as needed, prior to cycle #6.    All questions were answered. The patient knows to call the clinic with any problems, questions or concerns. No barriers to learning were detected.  I have spent a total of 30 minutes of face-to-face and non-face-to-face time, excluding clinical staff time, preparing to see patient, ordering tests and/or medications, counseling the patient, and independently interpreting results and communicating results to the patient/family/caregiver    ZVentura Sellers MD Medical Director of Neuro-Oncology [facility] WDoctor Phillips03/17/22 2:28 PM

## 2020-06-05 ENCOUNTER — Other Ambulatory Visit (HOSPITAL_COMMUNITY): Payer: Self-pay

## 2020-06-08 ENCOUNTER — Telehealth: Payer: Self-pay | Admitting: Internal Medicine

## 2020-06-08 NOTE — Telephone Encounter (Signed)
Scheduled per 3/17 los. Called and spoke with patient. Confirmed appt

## 2020-06-11 ENCOUNTER — Other Ambulatory Visit: Payer: Self-pay | Admitting: Radiation Therapy

## 2020-06-25 ENCOUNTER — Other Ambulatory Visit (HOSPITAL_COMMUNITY): Payer: Self-pay

## 2020-06-26 ENCOUNTER — Ambulatory Visit (HOSPITAL_COMMUNITY): Payer: Medicaid Other

## 2020-06-26 ENCOUNTER — Ambulatory Visit (HOSPITAL_COMMUNITY): Admission: RE | Admit: 2020-06-26 | Payer: Medicaid Other | Source: Ambulatory Visit

## 2020-06-27 ENCOUNTER — Other Ambulatory Visit: Payer: Self-pay

## 2020-06-27 ENCOUNTER — Ambulatory Visit (HOSPITAL_COMMUNITY)
Admission: RE | Admit: 2020-06-27 | Discharge: 2020-06-27 | Disposition: A | Discharge status: Home / Self Care | Payer: Medicaid Other | Source: Ambulatory Visit | Attending: Internal Medicine | Admitting: Internal Medicine

## 2020-06-27 ENCOUNTER — Ambulatory Visit (HOSPITAL_COMMUNITY): Payer: Medicaid Other

## 2020-06-27 DIAGNOSIS — C714 Malignant neoplasm of occipital lobe: Secondary | ICD-10-CM | POA: Diagnosis not present

## 2020-06-27 IMAGING — MR MR HEAD WO/W CM
16 of 17 series · 41 of 48 positions shown · IV contrast (gadavist)
Comparison: [DATE]

CLINICAL DATA: Follow-up oligodendroglioma.

EXAM:
MRI HEAD WITHOUT AND WITH CONTRAST
TECHNIQUE: Multiplanar, multiecho pulse sequences of the brain and surrounding
structures were obtained without and with intravenous contrast.
CONTRAST:  6mL GADAVIST GADOBUTROL 1 MMOL/ML IV SOLN

[Series 5: DWI · axial · 3.0mm · 1.36mm/px · z∈[-69,+87]mm · 5 of 108 slices shown (1 of 2)]
[im 1/108]
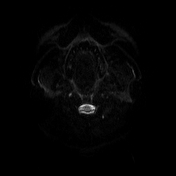
[im 27/108]
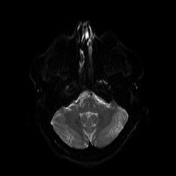
[im 54/108]
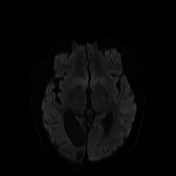
[im 81/108]
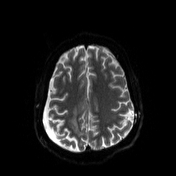
[im 108/108]
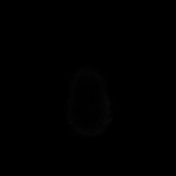

[Series 6: DWI · axial · 3.0mm · 1.36mm/px · z∈[-69,+87]mm · 3 of 54 slices shown (2 of 2)]
[im 1/54]
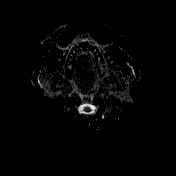
[im 27/54]
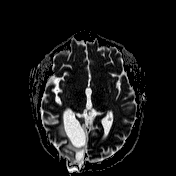
[im 54/54]
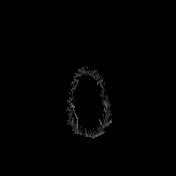

[Series 7: T1 · sagittal · 5.0mm · 0.75mm/px · 1 of 27 slices shown (1 of 4)]
[im 1/27]
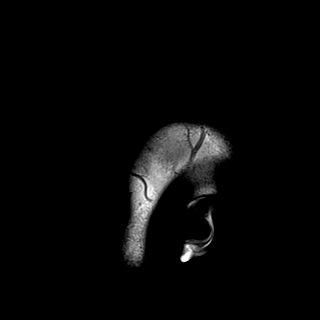

[Series 8: mip_images(sw) · axial · 24.0mm · 0.75mm/px · z∈[-55,+75]mm · 2 of 45 slices shown]
[im 1/45]
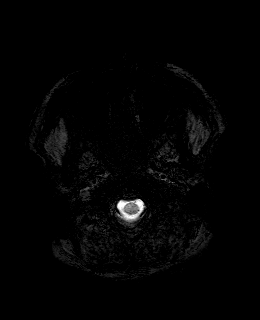
[im 45/45]
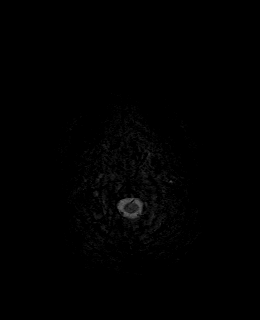

[Series 9: swi_images · axial · 3.0mm · 0.75mm/px · z∈[-65,+85]mm · 2 of 52 slices shown]
[im 1/52]
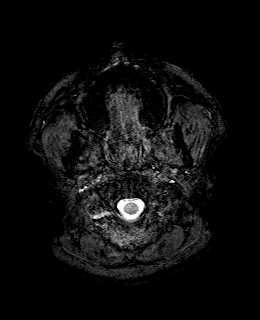
[im 52/52]
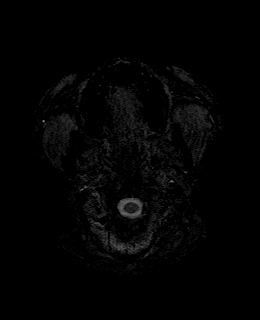

[Series 10: T2 · axial · 5.0mm · 0.62mm/px · 1 of 24 slices shown]
[im 1/24]
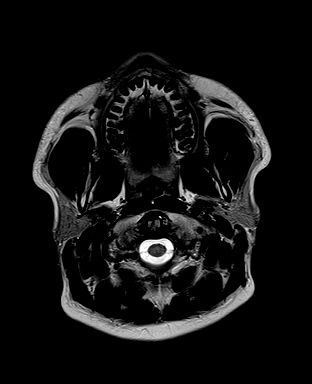

[Series 11: FLAIR · axial · 3.0mm · 0.75mm/px · z∈[-65,+85]mm · 2 of 52 slices shown]
[im 1/52]
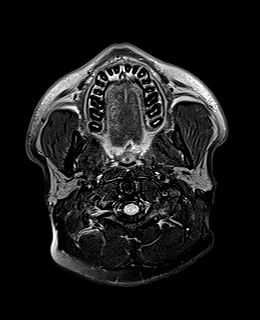
[im 52/52]
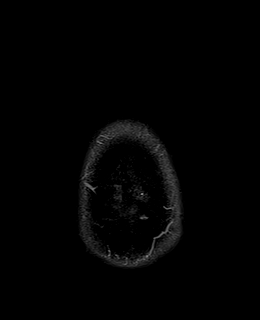

[Series 12: T1 · axial · 1.0mm · 0.94mm/px · z∈[-56,+84]mm · 7 of 144 slices shown (2 of 4)]
[im 1/144]
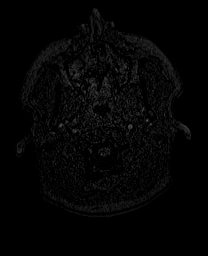
[im 24/144]
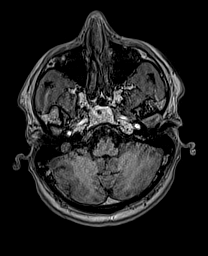
[im 48/144]
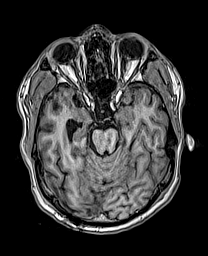
[im 72/144]
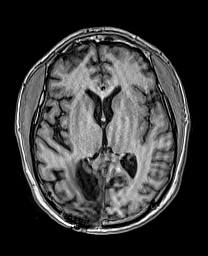
[im 96/144]
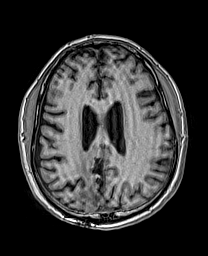
[im 120/144]
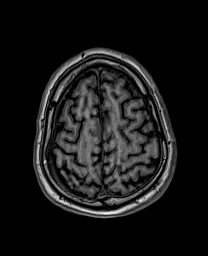
[im 144/144]
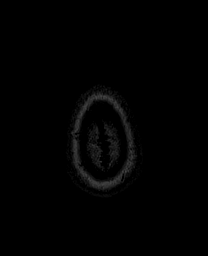

[Series 13: cor dwi_tracew · coronal · 5.0mm · 1.53mm/px · 3 of 60 slices shown]
[im 1/60]
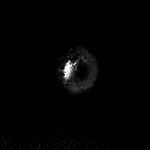
[im 30/60]
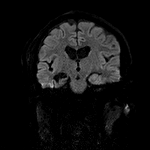
[im 60/60]
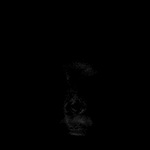

[Series 14: cor dwi_adc · coronal · 5.0mm · 1.53mm/px · 1 of 30 slices shown]
[im 1/30]
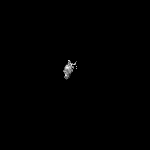

[Series 15: T2 post-contrast · coronal · 5.0mm · 0.57mm/px · 1 of 30 slices shown]
[im 1/30]
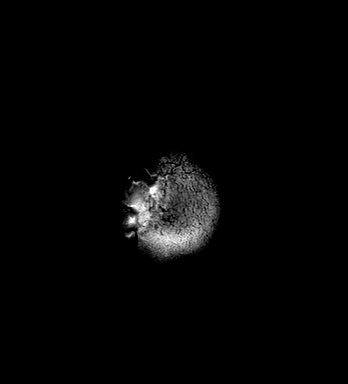

[Series 16: T1 post-contrast · axial · 1.0mm · 0.94mm/px · z∈[-56,+84]mm · 7 of 144 slices shown (1 of 3)]
[im 1/144]
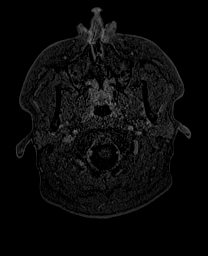
[im 24/144]
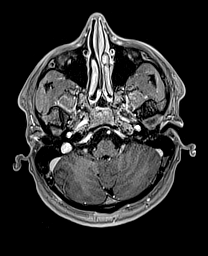
[im 48/144]
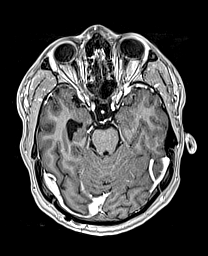
[im 72/144]
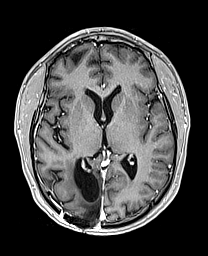
[im 96/144]
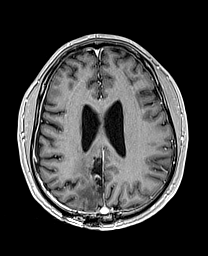
[im 120/144]
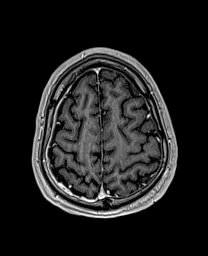
[im 144/144]
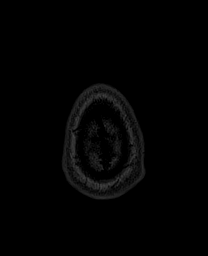

[Series 17: T1 · sagittal · 4.0mm · 0.94mm/px · 2 of 35 slices shown (3 of 4)]
[im 1/35]
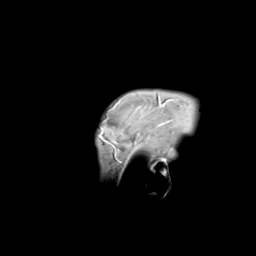
[im 35/35]
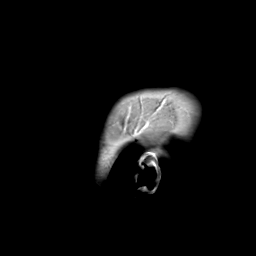

[Series 18: T1 · coronal · 4.0mm · 0.94mm/px · 2 of 45 slices shown (4 of 4)]
[im 1/45]
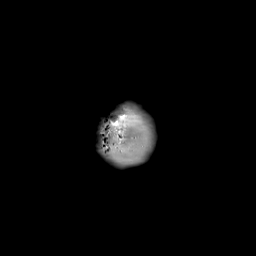
[im 45/45]
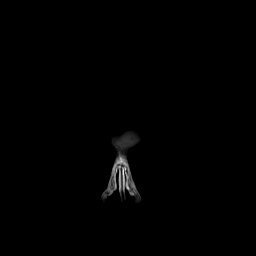

[Series 19: T1 post-contrast · coronal · 5.0mm · 0.43mm/px · 1 of 30 slices shown (2 of 3)]
[im 1/30]
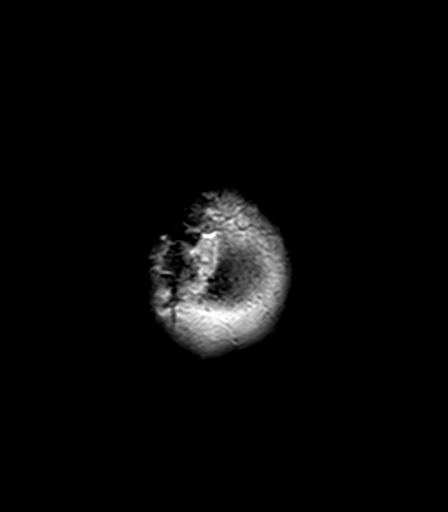

[Series 20: T1 post-contrast · sagittal · 5.0mm · 0.75mm/px · 1 of 27 slices shown (3 of 3)]
[im 1/27]
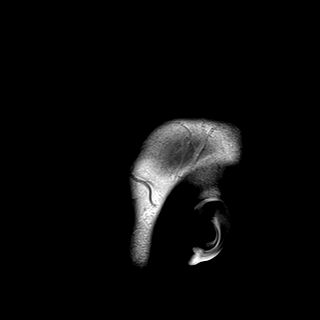

[41 of 48 positions shown; findings below may reference images not displayed]

FINDINGS: Brain: Postoperative changes are again seen with a resection cavity
containing chronic blood products in the parasagittal right
parieto-occipital region. There is associated ex vacuo dilatation of
the right lateral ventricle. Surrounding T2 hyperintensity in the
right parietal and occipital lobes extending into the posterior
aspects of the right frontal and temporal lobes has not
significantly changed. The new punctate focus of faint enhancement
in the right occipital lobe on the prior study is no longer present.
Other small foci of enhancement in the right parieto-occipital
region near the margins of the resection cavity and in the right
periatrial region have not significantly changed. No new foci of
suspicious enhancement are identified.

Dural enhancement over the right cerebral convexity and a small
right convexity subdural fluid collection measuring up to 5 mm in
thickness have not significantly changed. There is no significant
associated mass effect. No acute infarct is identified. Chronic
microhemorrhages in the temporal lobes are unchanged.

Vascular: Major intracranial vascular flow voids are preserved.

Skull and upper cervical spine: Right parieto-occipital craniotomy.
No suspicious marrow lesion.

Sinuses/Orbits: Unremarkable orbits. Minimal bilateral ethmoid air
cell mucosal thickening. Clear mastoid air cells.

Other: None.
IMPRESSION: Right parieto-occipital postsurgical changes with stable regional T2
hyperintensity and stable to decreased amount of minimal regional
enhancement. No new or progressive finding.

## 2020-06-27 MED ORDER — GADOBUTROL 1 MMOL/ML IV SOLN
6.0000 mL | Freq: Once | INTRAVENOUS | Status: AC | PRN
  Administered 2020-06-27: 6 mL via INTRAVENOUS

## 2020-06-29 ENCOUNTER — Other Ambulatory Visit (HOSPITAL_COMMUNITY): Payer: Self-pay

## 2020-06-29 ENCOUNTER — Inpatient Hospital Stay: Payer: Medicaid Other | Attending: Internal Medicine

## 2020-06-30 ENCOUNTER — Other Ambulatory Visit (HOSPITAL_COMMUNITY): Payer: Self-pay

## 2020-06-30 ENCOUNTER — Inpatient Hospital Stay (HOSPITAL_BASED_OUTPATIENT_CLINIC_OR_DEPARTMENT_OTHER): Payer: Medicaid Other | Admitting: Internal Medicine

## 2020-06-30 DIAGNOSIS — C714 Malignant neoplasm of occipital lobe: Secondary | ICD-10-CM

## 2020-06-30 MED ORDER — TEMOZOLOMIDE 100 MG PO CAPS
200.0000 mg | ORAL_CAPSULE | Freq: Every day | ORAL | 0 refills | Status: AC
  Filled 2020-06-30: qty 10, 5d supply, fill #0

## 2020-06-30 MED ORDER — TEMOZOLOMIDE 140 MG PO CAPS
140.0000 mg | ORAL_CAPSULE | Freq: Every day | ORAL | 0 refills | Status: AC
  Filled 2020-06-30: qty 5, 5d supply, fill #0

## 2020-06-30 NOTE — Progress Notes (Signed)
I connected with Stephen Beard on 06/30/20 at 12:30 PM EDT by telephone visit and verified that I am speaking with the correct person using two identifiers.  I discussed the limitations, risks, security and privacy concerns of performing an evaluation and management service by telemedicine and the availability of in-person appointments. I also discussed with the patient that there may be a patient responsible charge related to this service. The patient expressed understanding and agreed to proceed.  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  Living life well in [place], [place].   Observations: Language and cognition at baseline  Imaging:  [place] Clinician Interpretation: I have personally reviewed the CNS images as listed.  My interpretation, in the context of the patient's clinical presentation, is stable disease  MR BRAIN W WO CONTRAST  Result Date: 06/29/2020 CLINICAL DATA:  Follow-up oligodendroglioma. EXAM: MRI HEAD WITHOUT AND WITH CONTRAST TECHNIQUE: Multiplanar, multiecho pulse sequences of the brain and surrounding structures were obtained without and with intravenous contrast. CONTRAST:  71mL GADAVIST GADOBUTROL 1 MMOL/ML IV SOLN COMPARISON:  04/15/2020 FINDINGS: Brain: Postoperative changes are again seen with a resection cavity containing chronic blood products in the parasagittal right parieto-occipital region. There is associated ex vacuo dilatation of the right lateral ventricle. Surrounding T2 hyperintensity in the right parietal and occipital lobes extending into the posterior aspects of the right frontal and  temporal lobes has not significantly changed. The new punctate focus of faint enhancement in the right occipital lobe on the prior study is no longer present. Other small foci of enhancement in the right parieto-occipital region near the margins of the resection cavity and in the right periatrial region have not significantly changed. No new foci of suspicious enhancement are identified. Dural enhancement over the right cerebral convexity and a small right convexity subdural fluid collection measuring up to 5 mm in thickness have not significantly changed. There is no significant associated mass effect. No acute infarct is identified. Chronic microhemorrhages in the temporal lobes are unchanged. Vascular: Major intracranial vascular flow voids are preserved. Skull and upper cervical spine: Right parieto-occipital craniotomy. No suspicious marrow lesion. Sinuses/Orbits: Unremarkable orbits. Minimal bilateral ethmoid air cell mucosal thickening. Clear mastoid air cells. Other: None. IMPRESSION: Right parieto-occipital postsurgical changes with stable regional T2 hyperintensity and stable to decreased amount of minimal regional enhancement. No new or progressive finding. Electronically Signed   By: Logan Bores M.D.   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  Enhancing focus seen on prior MRI has resolved.    We recommended continuing treatment with cycle #6 Temozolomide 200 mg/m2, on for five days and off for twenty three days in twenty eight day cycles. The patient will have a complete blood count performed on days 21 and 28 of each cycle, and a comprehensive metabolic panel performed on day 28 of each cycle. Labs may need to be performed more often. Zofran will prescribed for home use for nausea/vomiting.    Chemotherapy should be held for the following:  ANC less than 1,000  Platelets less than 100,000  LFT or creatinine greater than 2x ULN  If clinical concerns/contraindications develop  Will transition to observation following this cycle, per our discussion.    We ask that Stephen Beard return to clinic in 3 months following next brain MRI, or sooner as needed.  Follow Up Instructions:  I discussed the assessment and treatment plan with the patient.  The patient was provided an opportunity to ask questions and all were answered.  The patient agreed with the plan and demonstrated understanding of the instructions.    The patient was advised to call back or seek an in-person evaluation if the symptoms worsen or if the condition fails to improve as anticipated.  I provided 5-10 minutes of non-face-to-face time during this enocunter.  Stephen Sellers, MD   I provided 25 minutes of non face-to-face telephone visit time during this encounter, and > 50% was spent counseling as documented under my assessment & plan.

## 2020-07-02 ENCOUNTER — Other Ambulatory Visit: Payer: Self-pay | Admitting: *Deleted

## 2020-07-02 ENCOUNTER — Other Ambulatory Visit (HOSPITAL_COMMUNITY): Payer: Self-pay

## 2020-07-02 ENCOUNTER — Other Ambulatory Visit: Payer: Self-pay | Admitting: Internal Medicine

## 2020-07-02 DIAGNOSIS — C714 Malignant neoplasm of occipital lobe: Secondary | ICD-10-CM

## 2020-07-02 MED ORDER — ONDANSETRON HCL 8 MG PO TABS
8.0000 mg | ORAL_TABLET | Freq: Two times a day (BID) | ORAL | 1 refills | Status: DC | PRN
  Filled 2020-07-02: qty 30, 15d supply, fill #0

## 2020-07-02 NOTE — Progress Notes (Signed)
Added order

## 2020-07-02 NOTE — Telephone Encounter (Signed)
Rx refill request

## 2020-07-03 ENCOUNTER — Other Ambulatory Visit (HOSPITAL_COMMUNITY): Payer: Self-pay

## 2020-07-23 ENCOUNTER — Other Ambulatory Visit (HOSPITAL_COMMUNITY): Payer: Self-pay

## 2020-08-05 ENCOUNTER — Telehealth (HOSPITAL_COMMUNITY): Payer: Medicaid Other | Admitting: Psychiatry

## 2020-08-05 ENCOUNTER — Other Ambulatory Visit: Payer: Self-pay

## 2020-08-17 ENCOUNTER — Telehealth: Payer: Self-pay | Admitting: *Deleted

## 2020-08-17 NOTE — Telephone Encounter (Signed)
Patient needs medical clearance for wisdom teeth extraction for [facility] office in [place] phone [phone] fax [phone] and for further dental procedures at [facility] in [place] phone [phone] fax [phone].  Letter prepared and faxed to both

## 2020-09-17 ENCOUNTER — Other Ambulatory Visit: Payer: Self-pay | Admitting: *Deleted

## 2020-10-06 ENCOUNTER — Encounter: Payer: Self-pay | Admitting: Internal Medicine

## 2020-10-09 ENCOUNTER — Ambulatory Visit (HOSPITAL_COMMUNITY): Admission: RE | Admit: 2020-10-09 | Payer: Medicaid Other | Source: Ambulatory Visit

## 2020-10-09 ENCOUNTER — Telehealth: Payer: Self-pay | Admitting: Internal Medicine

## 2020-10-09 NOTE — Telephone Encounter (Signed)
Called.

## 2020-10-09 NOTE — Telephone Encounter (Signed)
Called patient regarding 08/02 appointment, left a voicemail.

## 2020-10-13 ENCOUNTER — Inpatient Hospital Stay: Payer: Medicaid Other | Admitting: Internal Medicine

## 2020-10-14 ENCOUNTER — Other Ambulatory Visit: Payer: Self-pay | Admitting: Radiation Therapy

## 2020-10-20 ENCOUNTER — Other Ambulatory Visit: Payer: Self-pay

## 2020-10-20 ENCOUNTER — Ambulatory Visit (HOSPITAL_COMMUNITY)
Admission: RE | Admit: 2020-10-20 | Discharge: 2020-10-20 | Disposition: A | Discharge status: Home / Self Care | Payer: Medicaid Other | Source: Ambulatory Visit | Attending: Internal Medicine | Admitting: Internal Medicine

## 2020-10-20 DIAGNOSIS — C714 Malignant neoplasm of occipital lobe: Secondary | ICD-10-CM | POA: Insufficient documentation

## 2020-10-20 IMAGING — MR MR HEAD WO/W CM
13 series · 48 of 48 positions shown · IV contrast (gadavist)
Comparison: [DATE].  [DATE].  [DATE].

CLINICAL DATA: Brain tumor. Assess treatment response.
Oligodendroglioma of occipital lobe.

EXAM:
MRI HEAD WITHOUT AND WITH CONTRAST
TECHNIQUE: Multiplanar, multiecho pulse sequences of the brain and surrounding
structures were obtained without and with intravenous contrast.
CONTRAST:  6.5mL GADAVIST GADOBUTROL 1 MMOL/ML IV SOLN

[Series 5: DWI · axial · 3.0mm · 1.36mm/px · z∈[-70,+81]mm · 7 of 104 slices shown (1 of 2)]
[im 1/104]
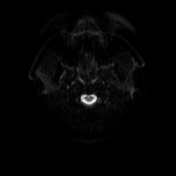
[im 18/104]
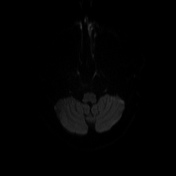
[im 35/104]
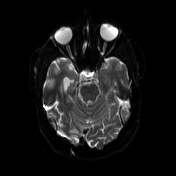
[im 52/104]
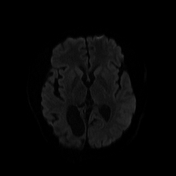
[im 69/104]
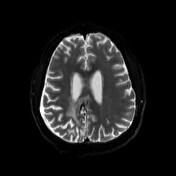
[im 86/104]
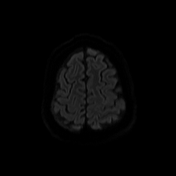
[im 104/104]
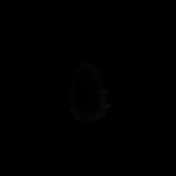

[Series 6: DWI · axial · 3.0mm · 1.36mm/px · z∈[-70,+81]mm · 3 of 51 slices shown (2 of 2)]
[im 1/51]
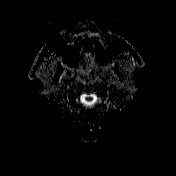
[im 26/51]
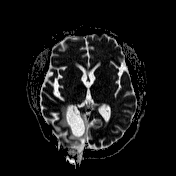
[im 51/51]
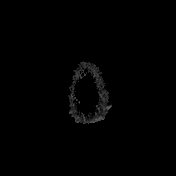

[Series 7: T1 · sagittal · 5.0mm · 0.75mm/px · 1 of 24 slices shown (1 of 2)]
[im 1/24]
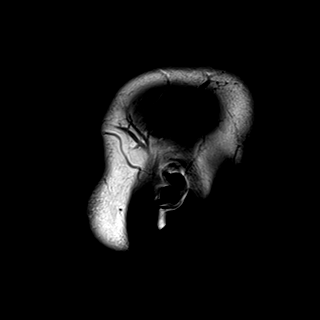

[Series 8: T2 · axial · 5.0mm · 0.62mm/px · z∈[-77,+84]mm · 2 of 26 slices shown]
[im 1/26]
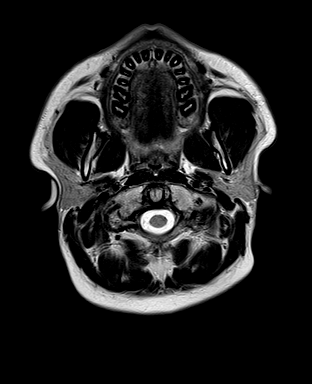
[im 26/26]
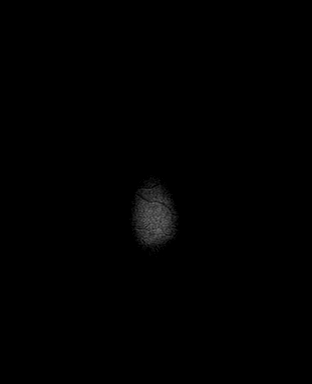

[Series 9: swi_images · axial · 3.0mm · 0.75mm/px · z∈[-85,+91]mm · 4 of 60 slices shown]
[im 1/60]
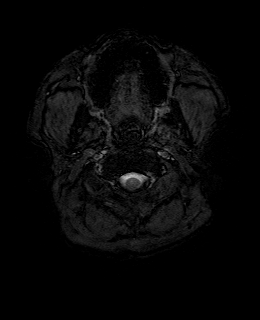
[im 20/60]
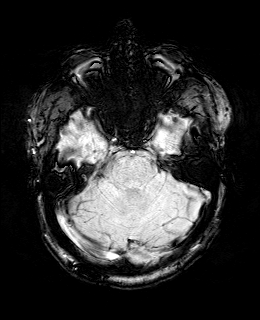
[im 40/60]
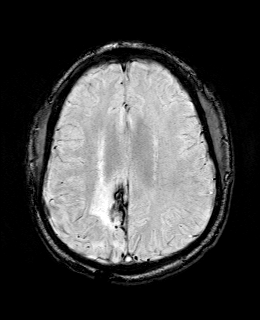
[im 60/60]
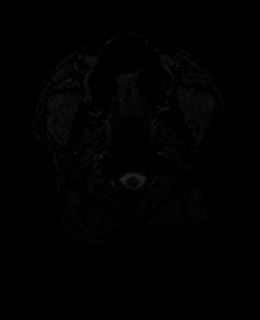

[Series 11: FLAIR · axial · 3.0mm · 0.75mm/px · z∈[-73,+79]mm · 3 of 52 slices shown]
[im 1/52]
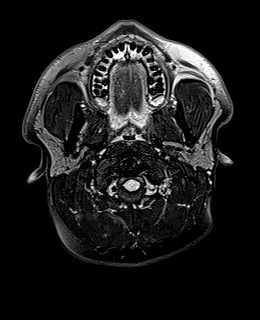
[im 26/52]
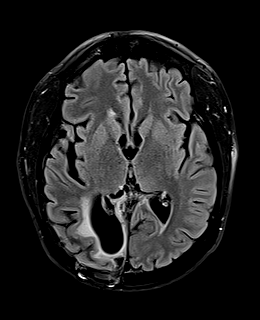
[im 52/52]
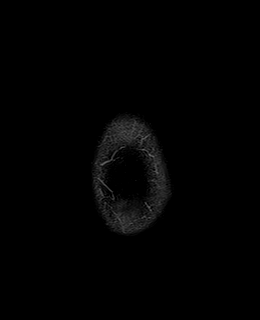

[Series 12: T1 · axial · 1.0mm · 0.94mm/px · z∈[-68,+74]mm · 9 of 144 slices shown (2 of 2)]
[im 1/144]
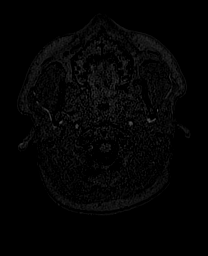
[im 18/144]
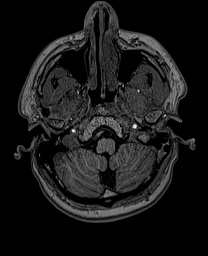
[im 36/144]
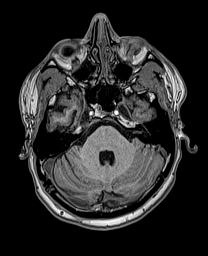
[im 54/144]
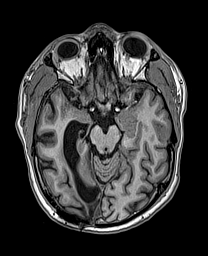
[im 72/144]
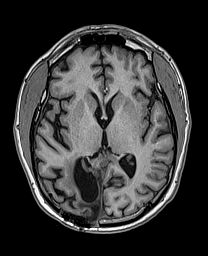
[im 90/144]
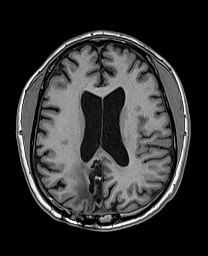
[im 108/144]
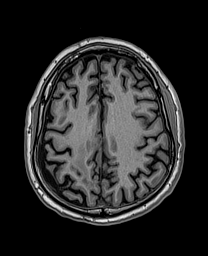
[im 126/144]
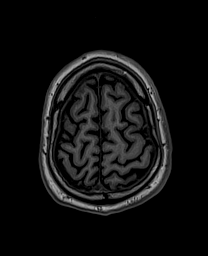
[im 144/144]
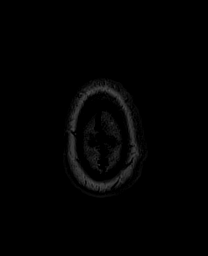

[Series 13: cor dwi_tracew · coronal · 5.0mm · 1.53mm/px · 3 of 56 slices shown]
[im 1/56]
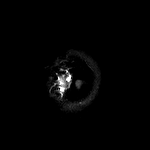
[im 28/56]
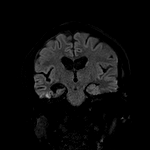
[im 56/56]
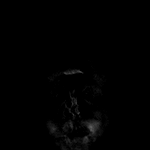

[Series 14: cor dwi_adc · coronal · 5.0mm · 1.53mm/px · 2 of 28 slices shown]
[im 1/28]
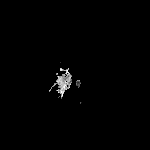
[im 28/28]
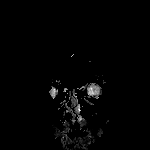

[Series 15: T2 post-contrast · coronal · 5.0mm · 0.57mm/px · 2 of 28 slices shown]
[im 1/28]
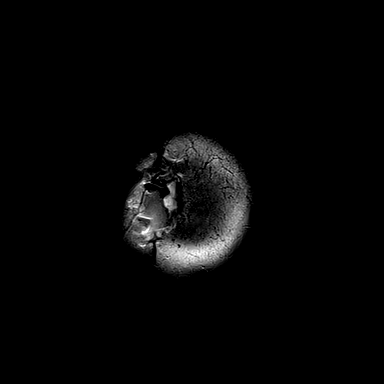
[im 28/28]
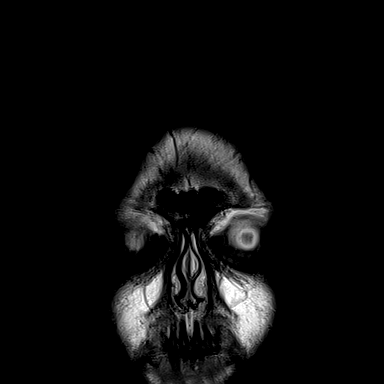

[Series 16: T1 post-contrast · axial · 1.0mm · 0.94mm/px · z∈[-68,+74]mm · 9 of 144 slices shown (1 of 3)]
[im 1/144]
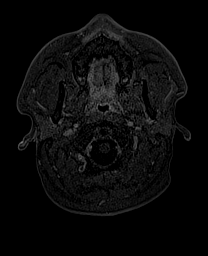
[im 18/144]
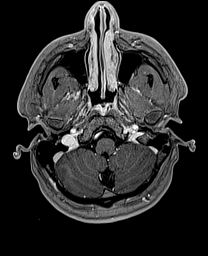
[im 36/144]
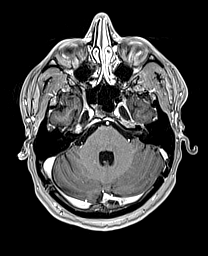
[im 54/144]
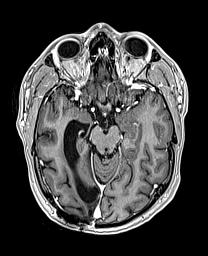
[im 72/144]
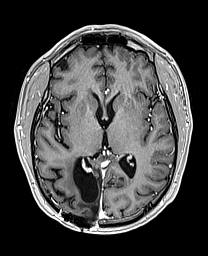
[im 90/144]
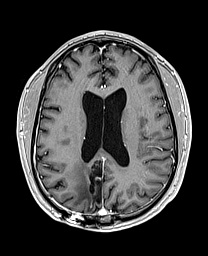
[im 108/144]
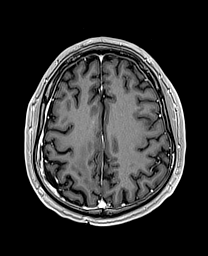
[im 126/144]
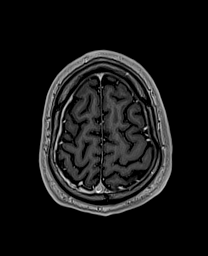
[im 144/144]
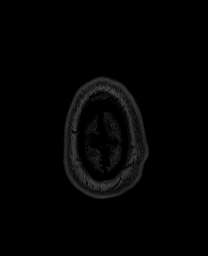

[Series 17: T1 post-contrast · coronal · 5.0mm · 0.43mm/px · 2 of 28 slices shown (2 of 3)]
[im 1/28]
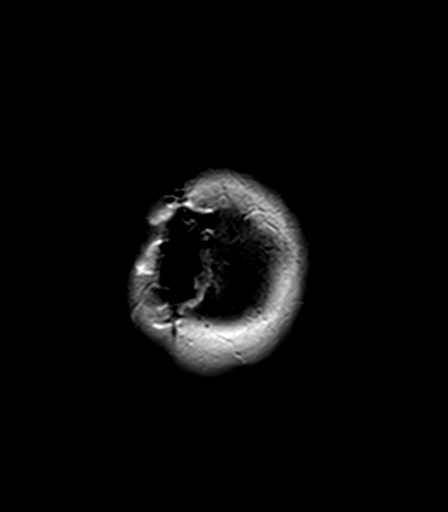
[im 28/28]
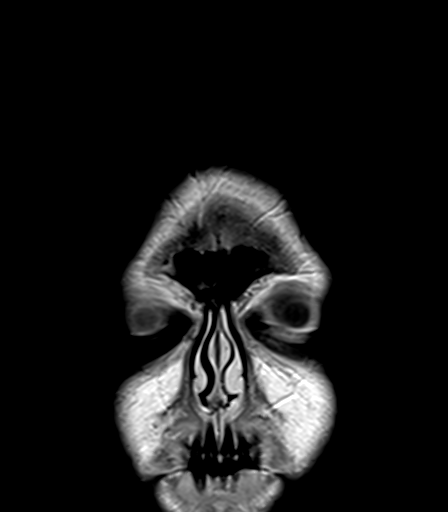

[Series 18: T1 post-contrast · sagittal · 5.0mm · 0.75mm/px · 1 of 24 slices shown (3 of 3)]
[im 1/24]
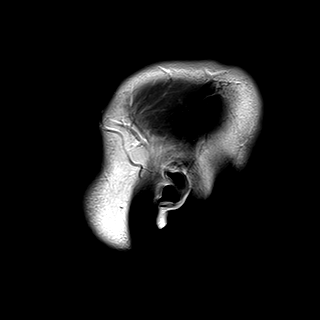

[48 of 48 positions shown; findings below may reference images not displayed]

FINDINGS: Brain: Stable examination since the study of 4 months ago. No
abnormality affects the brainstem or cerebellum. Left cerebral
hemisphere is normal except for T2 and FLAIR signal of the splenium
of the corpus callosum without enhancement as seen previously.
Previous resection of mass in the right occipital lobe with ex vacuo
enlargement of the occipital horn of the right lateral ventricle.
Regional brain tissue does not show increasing mass effect. Stable
T2 and FLAIR signal and stable regional cystic changes. No evidence
new or worsening contrast enhancement. Chronic postoperative dural
thickening on the right as seen previously. No sign of obstructive
hydrocephalus.

Vascular: Major vessels at the base of the brain show flow.

Skull and upper cervical spine: Otherwise negative

Sinuses/Orbits: Clear/normal

Other: None
IMPRESSION: Stable examination since DIL[place]ED[place] of this year. No progressive or
worsening finding. Postsurgical and post treatment changes in the
right parieto-occipital brain.

## 2020-10-20 MED ORDER — GADOBUTROL 1 MMOL/ML IV SOLN
6.5000 mL | Freq: Once | INTRAVENOUS | Status: AC | PRN
  Administered 2020-10-20: 6.5 mL via INTRAVENOUS

## 2020-10-22 ENCOUNTER — Telehealth: Payer: Self-pay | Admitting: Internal Medicine

## 2020-10-22 ENCOUNTER — Inpatient Hospital Stay: Payer: Medicaid Other | Admitting: Internal Medicine

## 2020-10-22 NOTE — Telephone Encounter (Signed)
R/s appt per 8/11 sch msg. Called pt, no answer. Left msg with appt date and time.  

## 2020-10-26 ENCOUNTER — Inpatient Hospital Stay: Payer: Medicaid Other | Attending: Internal Medicine

## 2020-11-02 ENCOUNTER — Inpatient Hospital Stay: Payer: Medicaid Other | Admitting: Internal Medicine

## 2020-11-25 ENCOUNTER — Telehealth: Payer: Self-pay | Admitting: *Deleted

## 2020-11-25 NOTE — Telephone Encounter (Signed)
Received call from pt requesting an appt tomorrow, 11/26/20. Advised that he could be seen 2 9:30 am. He is agreeable to that time. Scheduling message sent

## 2020-11-26 ENCOUNTER — Inpatient Hospital Stay: Payer: Medicaid Other | Attending: Internal Medicine | Admitting: Internal Medicine

## 2020-11-26 ENCOUNTER — Other Ambulatory Visit: Payer: Self-pay

## 2020-11-26 VITALS — BP 130/88 | HR 89 | Temp 98.1°F | Resp 18 | Ht 67.0 in | Wt 156.8 lb

## 2020-11-26 DIAGNOSIS — Z923 Personal history of irradiation: Secondary | ICD-10-CM | POA: Insufficient documentation

## 2020-11-26 DIAGNOSIS — R569 Unspecified convulsions: Secondary | ICD-10-CM | POA: Diagnosis not present

## 2020-11-26 DIAGNOSIS — F251 Schizoaffective disorder, depressive type: Secondary | ICD-10-CM

## 2020-11-26 DIAGNOSIS — Z79899 Other long term (current) drug therapy: Secondary | ICD-10-CM | POA: Insufficient documentation

## 2020-11-26 DIAGNOSIS — F17210 Nicotine dependence, cigarettes, uncomplicated: Secondary | ICD-10-CM | POA: Insufficient documentation

## 2020-11-26 DIAGNOSIS — Z9221 Personal history of antineoplastic chemotherapy: Secondary | ICD-10-CM | POA: Insufficient documentation

## 2020-11-26 DIAGNOSIS — C714 Malignant neoplasm of occipital lobe: Secondary | ICD-10-CM | POA: Insufficient documentation

## 2020-11-26 MED ORDER — TRAZODONE HCL 100 MG PO TABS: 100.0000 mg | ORAL_TABLET | Freq: Every evening | ORAL | 2 refills | Status: DC | PRN

## 2020-11-26 MED ORDER — LAMOTRIGINE 25 MG PO TABS: 50.0000 mg | ORAL_TABLET | Freq: Two times a day (BID) | ORAL | 3 refills | Status: DC

## 2020-11-26 NOTE — Progress Notes (Signed)
[facility] [place], [place] [address] [phone]   Interval Evaluation  Date of Service: 11/26/20 Patient Name: Stephen Beard Patient MRN: [id number] Patient DOB: [birth year] Provider: [name], MD  Identifying Statement:  Stephen Beard is a 25 y.o. male with right parietal  anaplastic oligodendoglioma    Oncologic History: Oncology History  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[facility])  07/02/2019 Surgery   Craniotomy, resection by Dr. Zada Finders. Followed by hematoma evacuation on 07/03/19.   08/05/2019 - 09/17/2019 Radiation Therapy   IMRT with concurrent Temodar 36m/m2 daily   10/15/2019 -  Chemotherapy    Patient is on Treatment Plan: BRAIN ANAPLASTIC GLIOMA GRADE III TEMOZOLOMIDE POST XRT Q28D         Biomarkers:  MGMT Unknown.  IDH 1/2 Mutated.  EGFR Unknown  1p/19q co-deleted   Interval History:  Stephen Beard presents today for follow up after recent MRI brain.  He does describe two seizures in the past 3 weeks, of typical semiology.  There was no clear provocation identified.  Denies new or progressive deficits.  Occassional tension headaches only.  [place]  H+P (07/19/19) Patient presented to medical attention with several months of progressive headaches and left sided visual impairment.  This was accompanied by episodes of deja-vu +paroxysmal burning smells which would occur almost daily over that time.  CNS imaging demonstrated large parieto-occipital mass with herniation syndrome.  Mass was resected/debulked, and post-operative course was complicated by intracranial hematoma and also respiratory failure.  At present, he is walking and talking, carries left sided visual impairment.  Currently on antibiotics for wound infection per Dr. OZada Finders  Not requiring steroids.  Currently on lamictal 557mdaily.  Medications: Current Outpatient  Medications on File Prior to Visit  Medication Sig Dispense Refill   lamoTRIgine (LAMICTAL) 200 MG tablet Take 20054mn AM, 250m5m PM. 60 tablet 2   lamoTRIgine (LAMICTAL) 25 MG tablet Take 2 tablets (50 mg total) by mouth at bedtime. Take 2 tablets along with a 200 mg at bedtime. 60 tablet 3   OLANZapine (ZYPREXA) 7.5 MG tablet Take 1 tablet (7.5 mg total) by mouth at bedtime. 30 tablet 2   ondansetron (ZOFRAN) 8 MG tablet Take 1 tablet (8 mg total) by mouth 2 (two) times daily as needed (nausea and vomiting). May take 30-60 minutes prior to Temodar administration if nausea/vomiting occurs. 30 tablet 1   sertraline (ZOLOFT) 50 MG tablet Take 1 tablet (50 mg total) by mouth daily. 30 tablet 2   temozolomide (TEMODAR) 100 MG capsule Take 200 mg by mouth at bedtime. X 5 days  Total dose 340 mg x 5 days then off repeat each cycle May take on an empty stomach or at bedtime to decrease nausea & vomiting.     temozolomide (TEMODAR) 100 MG capsule TAKE 2 CAPS (200 MG) BY MOUTH DAILY FOR 5 DAYS. TAKE ON DAYS 1-5 OF CYCLE 2 OF TREATMENT. MAY TAKE ON AN EMPTY STOMACH TO DECREASE NAUSEA AND VOMITING 10 capsule 0   temozolomide (TEMODAR) 100 MG capsule TAKE 2 CAPS (200 MG TOTAL) BY MOUTH DAILY FOR 5 DAYS. TAKE ON DAYS 1-5 OF CYCLE 2 OF TREATMENT. TAKE ON AN EMPTY STOMACH TO DECREASE NAUSEA 10 capsule 0   temozolomide (TEMODAR) 100 MG capsule TAKE 2 CAPSULES BY MOUTH DAILY FOR 5 DAYS. TAKE ON DAYS 1-5 OF CYCLE 2 OF TREATMENT. MAY TAKE ON AN  EMPTY STOMACH TO DECREASE NAUSEA AND VOMITING 10 capsule 0   temozolomide (TEMODAR) 140 MG capsule TAKE 1 CAP (140MG) BY MOUTH DAILY FOR 5 DAYS. TAKE ON DAYS 1-5 OF CYCLE 2 OF TREATMENT. MAY TAKE ON AN EMPTY STOMACH TO DECREASE NAUSEA AND VOMITING 5 capsule 0   temozolomide (TEMODAR) 140 MG capsule TAKE 1 CAP (140 MG TOTAL) BY MOUTH DAILY FOR 5 DAYS. TAKE ON DAYS 1-5 OF CYCLE 2 OF TREATMENT.TAKE ON AN EMPTY STOMACH TO DECREASE NAUSEA 5 capsule 0   traZODone (DESYREL) 100 MG  tablet Take 1 tablet (100 mg total) by mouth at bedtime as needed for sleep. 30 tablet 2   No current facility-administered medications on file prior to visit.    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    Headache    Psychotic disorder (HCC)    Seizures ([place])    Past Surgical History:  Past Surgical History:  Procedure Laterality Date   APPLICATION OF CRANIAL NAVIGATION N/A 07/02/2019   Procedure: APPLICATION OF CRANIAL NAVIGATION;  Surgeon: Judith Part, MD;  Location: [facility];  Service: Neurosurgery;  Laterality: N/A;   CRANIOTOMY N/A 07/03/2019   Procedure: CRANIOTOMY HEMATOMA EVACUATION SUBDURAL;  Surgeon: Judith Part, MD;  Location: [facility];  Service: Neurosurgery;  Laterality: N/A;   CRANIOTOMY Right 07/03/2019   Procedure: CRANIOTOMY HEMATOMA EVACUATION SUBDURAL;  Surgeon: Judith Part, MD;  Location: [facility];  Service: Neurosurgery;  Laterality: Right;   CRANIOTOMY Right 07/02/2019   Procedure: CRANIOTOMY TUMOR EXCISION with Lucky Rathke;  Surgeon: Judith Part, MD;  Location: [facility];  Service: Neurosurgery;  Laterality: Right;  posterior   Social History:  Social History   Socioeconomic History   Marital status: Single    Spouse name: Not on file   Number of children: Not on file   Years of education: Not on file   Highest education level: Not on file  Occupational History   Not on file  Tobacco Use   Smoking status: Every Day    Packs/day: 1.00    Years: 12.00    Pack years: 12.00    Types: Cigarettes   Smokeless tobacco: Current    Types: Chew  Vaping Use   Vaping Use: Never used  Substance and Sexual Activity   Alcohol use: Yes    Comment: last drink: "3 nights ago" when he had "1/4th of a bottle," has been drinking daily for several weeks from a "few beers to a bottle of liquor"   Drug use: Yes    Types:  Cocaine    Comment: last use "couple of weeks ago" when he had "2 lines,"  frequency "once in a blue moon"   Sexual activity: Not Currently  Other Topics Concern   Not on file  Social History Narrative   Not on file   Social Determinants of Health   Financial Resource Strain: Not on file  Food Insecurity: Not on file  Transportation Needs: Not on file  Physical Activity: Not on file  Stress: Not on file  Social Connections: Not on file  Intimate Partner Violence: Not on file   Family History: No family history on file.  Review of Systems: Constitutional: Doesn't report fevers, chills or abnormal weight loss Eyes: Doesn't report blurriness of vision Ears, nose, mouth, throat, and face: Doesn't report sore throat Respiratory: Doesn't report cough,  dyspnea or wheezes Cardiovascular: Doesn't report palpitation, chest discomfort  Gastrointestinal:  Doesn't report nausea, constipation, diarrhea GU: Doesn't report incontinence Skin: Doesn't report skin rashes Neurological: Per HPI Musculoskeletal: Doesn't report joint pain Behavioral/Psych: Doesn't report anxiety  Physical Exam: Vitals:   11/26/20 1329  BP: 130/88  Pulse: 89  Resp: 18  Temp: 98.1 F (36.7 C)  SpO2: 97%   KPS: 80. General: Alert, cooperative, pleasant, in no acute distress Head: Normal EENT: No conjunctival injection or scleral icterus.  Lungs: Resp effort normal Cardiac: Regular rate Abdomen: Non-distended abdomen Skin: No rashes cyanosis or petechiae. Extremities: No clubbing or edema  Neurologic Exam: Mental Status: Awake, alert, attentive to examiner. Oriented to self and environment. Language is fluent with intact comprehension.  Cranial Nerves: Visual acuity is grossly normal. Left homonymous hemianopia. Extra-ocular movements intact. No ptosis. Face is symmetric Motor: Tone and bulk are normal. Power is full in both arms and legs. Reflexes are symmetric, no pathologic reflexes present.   Sensory: Intact to light touch Gait: Sensory dystaxia   Labs: I have reviewed the data as listed    Component Value Date/Time   NA 137 05/28/2020 1413   K 3.9 05/28/2020 1413   CL 107 05/28/2020 1413   CO2 22 05/28/2020 1413   GLUCOSE 101 (H) 05/28/2020 1413   BUN 12 05/28/2020 1413   CREATININE 0.78 05/28/2020 1413   CALCIUM 8.6 (L) 05/28/2020 1413   PROT 6.8 05/28/2020 1413   ALBUMIN 4.4 05/28/2020 1413   AST 16 05/28/2020 1413   ALT 23 05/28/2020 1413   ALKPHOS 73 05/28/2020 1413   BILITOT 0.4 05/28/2020 1413   GFRNONAA >60 05/28/2020 1413   GFRAA >60 12/05/2019 1206   Lab Results  Component Value Date   WBC 5.7 05/28/2020   NEUTROABS 3.8 05/28/2020   HGB 15.1 05/28/2020   HCT 42.3 05/28/2020   MCV 95.3 05/28/2020   PLT 147 (L) 05/28/2020   Imaging:  [facility] Clinician Interpretation: I have personally reviewed the CNS images as listed.  My interpretation, in the context of the patient's clinical presentation, is stable disease  CLINICAL DATA:  Brain tumor. Assess treatment response. Oligodendroglioma of occipital lobe.   EXAM: MRI HEAD WITHOUT AND WITH CONTRAST   TECHNIQUE: Multiplanar, multiecho pulse sequences of the brain and surrounding structures were obtained without and with intravenous contrast.   CONTRAST:  6.31m GADAVIST GADOBUTROL 1 MMOL/ML IV SOLN   COMPARISON:  06/27/2020.  04/15/2020.  12/12/2019.   FINDINGS: Brain: Stable examination since the study of 4 months ago. No abnormality affects the brainstem or cerebellum. Left cerebral hemisphere is normal except for T2 and FLAIR signal of the splenium of the corpus callosum without enhancement as seen previously. Previous resection of mass in the right occipital lobe with ex vacuo enlargement of the occipital horn of the right lateral ventricle. Regional brain tissue does not show increasing mass effect. Stable T2 and FLAIR signal and stable regional cystic changes. No evidence new or  worsening contrast enhancement. Chronic postoperative dural thickening on the right as seen previously. No sign of obstructive hydrocephalus.   Vascular: Major vessels at the base of the brain show flow.   Skull and upper cervical spine: Otherwise negative   Sinuses/Orbits: Clear/normal   Other: None   IMPRESSION: Stable examination since April of this year. No progressive or worsening finding. Postsurgical and post treatment changes in the right parieto-occipital brain.     Electronically Signed   By: MJan FiremanD.  On: 10/21/2020 11:02  Assessment/Plan 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HCC) [C71.4]  Nicodemus L Schmutz is clinically stable today, now having completed 6 cycles of adjuvant Temozolomide.  MRI demonstrates stable findings, no change in post-contrast or T2 sequences to suggest tumor progression.  We recommended continued imaging surveillance only at this time.    Will continue to seek psychiatric care, mental health counseling at home.  For breakthrough seizures, we recommended increasing Lamictal to 250/250.  We ask that Isac Lincks Roadcap return to clinic in 2 months following next brain MRI, or sooner as needed.    All questions were answered. The patient knows to call the clinic with any problems, questions or concerns. No barriers to learning were detected.  I have spent a total of 40 minutes of face-to-face and non-face-to-face time, excluding clinical staff time, preparing to see patient, ordering tests and/or medications, counseling the patient, and independently interpreting results and communicating results to the patient/family/caregiver    [name], MD Medical Director of Neuro-Oncology [facility] [place] 11/26/20 1:25 PM

## 2020-12-08 ENCOUNTER — Other Ambulatory Visit: Payer: Self-pay | Admitting: Internal Medicine

## 2020-12-08 ENCOUNTER — Telehealth: Payer: Self-pay | Admitting: *Deleted

## 2020-12-08 DIAGNOSIS — F251 Schizoaffective disorder, depressive type: Secondary | ICD-10-CM

## 2020-12-08 MED ORDER — TRAZODONE HCL 100 MG PO TABS
100.0000 mg | ORAL_TABLET | Freq: Every evening | ORAL | 3 refills | Status: DC | PRN
Start: 2020-12-08 — End: 2021-01-12

## 2020-12-08 NOTE — Telephone Encounter (Signed)
Patient mother called requesting refill of Trazodone.  It was just filled on 11/26/2020.  She states he has 5 tablets remaining.    He has been trying to take 1 tablet but he wakes up a couple of hours later and she has given him a second tablet.  She admitted that she even gave him a total of 3 tablets one night and it still didn't help all the way.  I explained that he is running out way to fast which she completely understood.  She also wanted to inform Dr Mickeal Skinner that with patients past history of medication abuse she has all of his medications locked up in a lock box and gives his medications out to him.    Her concerns are that insomnia might contribute to reoccurrence of seizures and needed recommendations even if it isn't trazodone.  Routing to MD to review and advise.

## 2020-12-18 ENCOUNTER — Emergency Department
Admission: EM | Admit: 2020-12-18 | Discharge: 2020-12-18 | Disposition: A | Discharge status: Home / Self Care | Payer: Medicaid Other | Attending: Emergency Medicine | Admitting: Emergency Medicine

## 2020-12-18 ENCOUNTER — Emergency Department (HOSPITAL_COMMUNITY)
Admission: EM | Admit: 2020-12-18 | Discharge: 2020-12-18 | Discharge status: Against Medical Advice | Payer: Medicaid Other

## 2020-12-18 ENCOUNTER — Other Ambulatory Visit: Payer: Self-pay

## 2020-12-18 ENCOUNTER — Emergency Department: Payer: Medicaid Other

## 2020-12-18 ENCOUNTER — Encounter: Payer: Self-pay | Admitting: Emergency Medicine

## 2020-12-18 DIAGNOSIS — K2920 Alcoholic gastritis without bleeding: Secondary | ICD-10-CM | POA: Diagnosis not present

## 2020-12-18 DIAGNOSIS — F17210 Nicotine dependence, cigarettes, uncomplicated: Secondary | ICD-10-CM | POA: Insufficient documentation

## 2020-12-18 DIAGNOSIS — R1013 Epigastric pain: Secondary | ICD-10-CM

## 2020-12-18 DIAGNOSIS — R1110 Vomiting, unspecified: Secondary | ICD-10-CM

## 2020-12-18 LAB — COMPREHENSIVE METABOLIC PANEL
ALT: 193 U/L — ABNORMAL HIGH (ref 0–44)
AST: 227 U/L — ABNORMAL HIGH (ref 15–41)
Albumin: 5.1 g/dL — ABNORMAL HIGH (ref 3.5–5.0)
Alkaline Phosphatase: 73 U/L (ref 38–126)
Anion gap: 15 (ref 5–15)
BUN: 12 mg/dL (ref 6–20)
CO2: 27 mmol/L (ref 22–32)
Calcium: 9.8 mg/dL (ref 8.9–10.3)
Chloride: 90 mmol/L — ABNORMAL LOW (ref 98–111)
Creatinine, Ser: 0.93 mg/dL (ref 0.61–1.24)
GFR, Estimated: >60 mL/min (ref >60)
Glucose, Bld: 114 mg/dL — ABNORMAL HIGH (ref 70–99)
Potassium: 3.1 mmol/L — ABNORMAL LOW (ref 3.5–5.1)
Sodium: 132 mmol/L — ABNORMAL LOW (ref 135–145)
Total Bilirubin: 1.5 mg/dL — ABNORMAL HIGH (ref 0.3–1.2)
Total Protein: 8.7 g/dL — ABNORMAL HIGH (ref 6.5–8.1)

## 2020-12-18 LAB — CBC
HCT: 51.5 % (ref 39.0–52.0)
Hemoglobin: 19 g/dL — ABNORMAL HIGH (ref 13.0–17.0)
MCH: 33.9 pg (ref 26.0–34.0)
MCHC: 36.9 g/dL — ABNORMAL HIGH (ref 30.0–36.0)
MCV: 92 fL (ref 80.0–100.0)
Platelets: 168 10*3/uL (ref 150–400)
RBC: 5.6 MIL/uL (ref 4.22–5.81)
RDW: 13.2 % (ref 11.5–15.5)
WBC: 8 10*3/uL (ref 4.0–10.5)
nRBC: 0 % (ref 0.0–0.2)

## 2020-12-18 LAB — LIPASE, BLOOD: Lipase: 30 U/L (ref 11–51)

## 2020-12-18 IMAGING — US US ABDOMEN LIMITED
1 series · 14 of 25 positions shown · non-contrast
Comparison: None.

CLINICAL DATA: Vomiting

EXAM:
ULTRASOUND ABDOMEN LIMITED RIGHT UPPER QUADRANT

[Series 1: us abdomen limited ruq (liver/gb) · 14 of 53 slices shown]
[im 1/53]
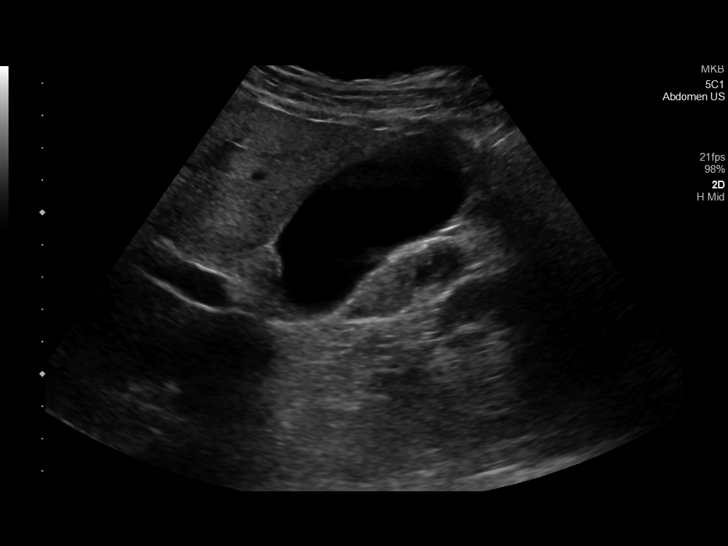
[im 5/53]
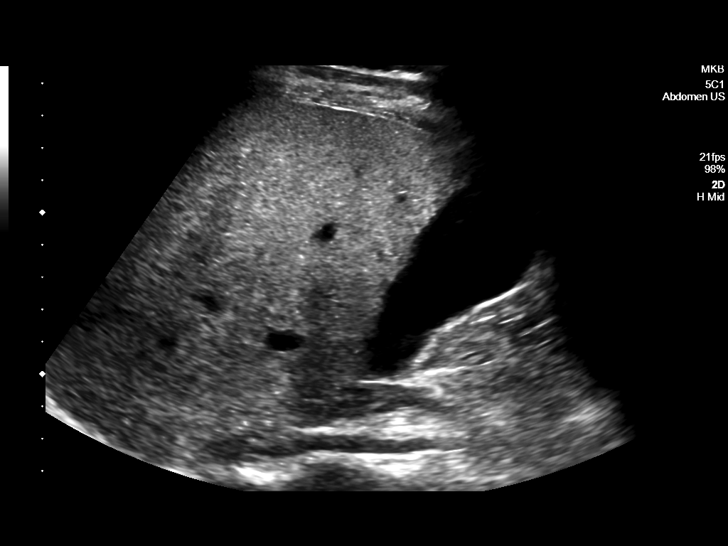
[im 9/53]
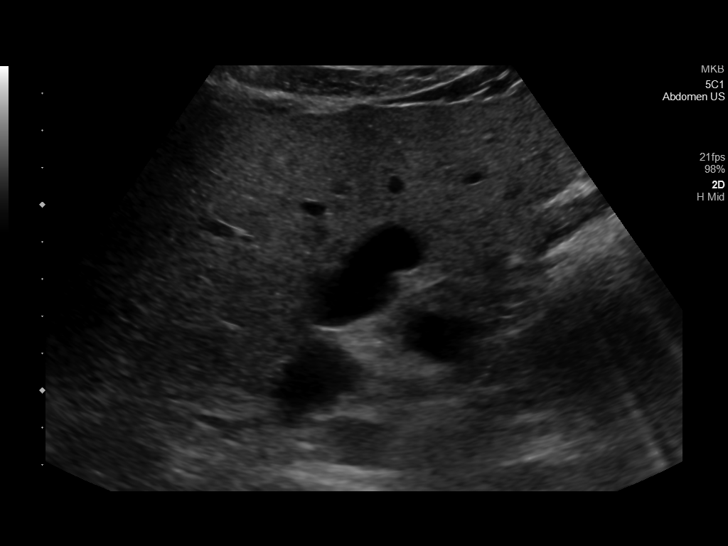
[im 14/53]
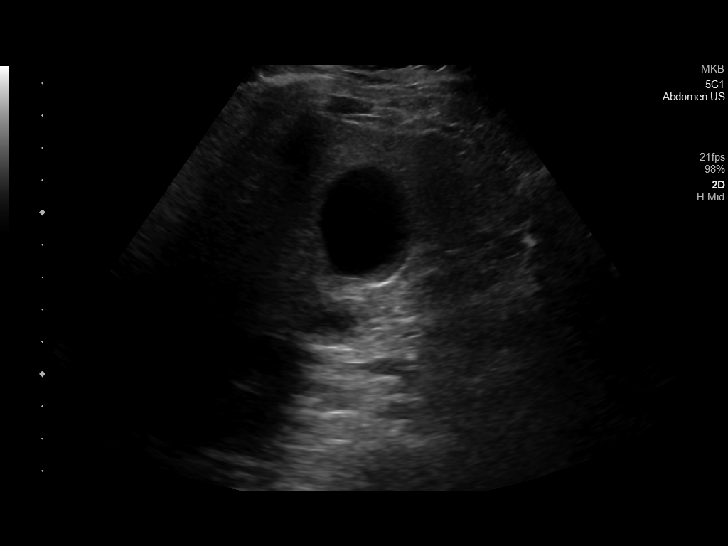
[im 18/53]
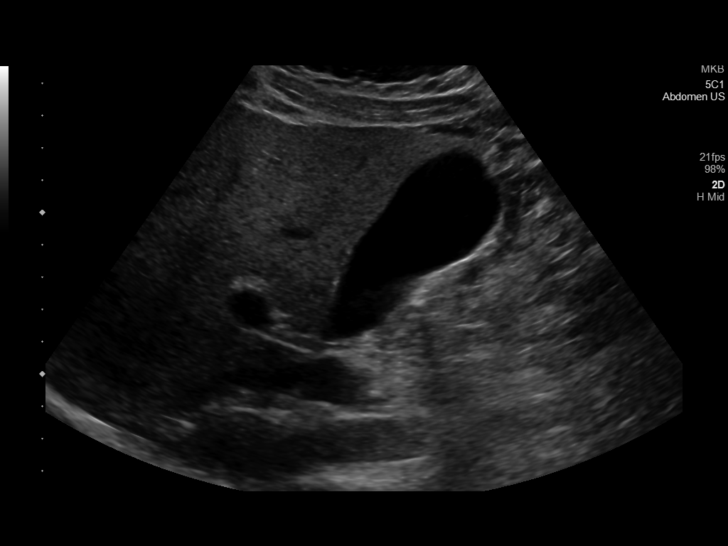
[im 20/53]
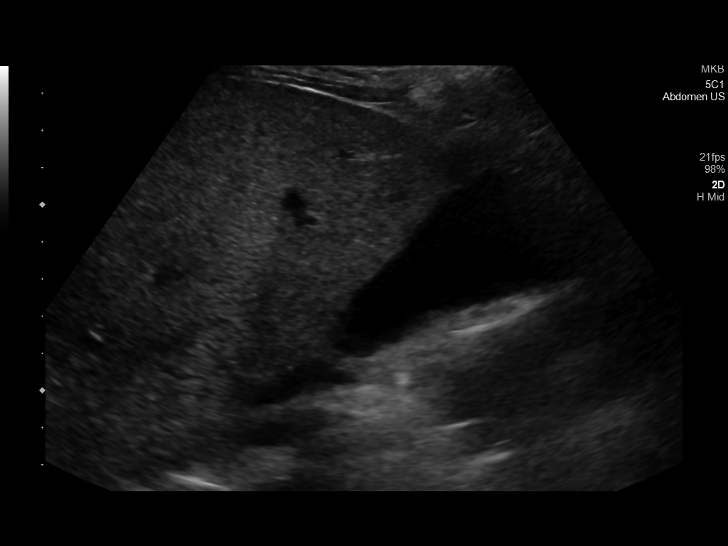
[im 24/53]
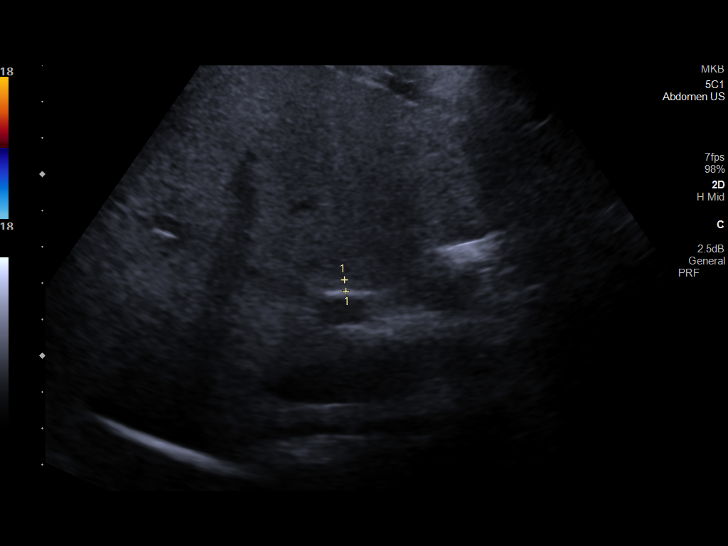
[im 29/53]
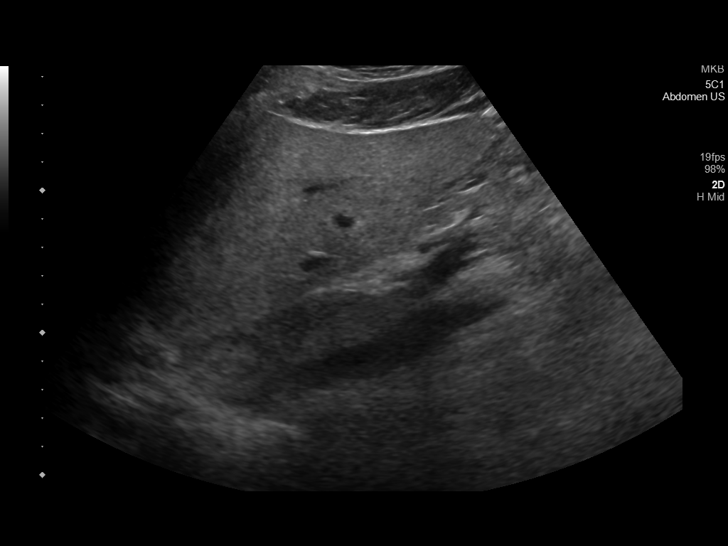
[im 33/53]
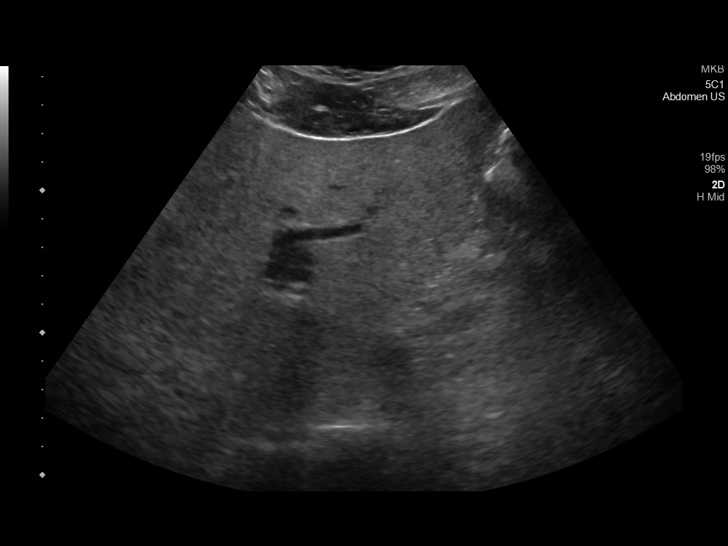
[im 35/53]
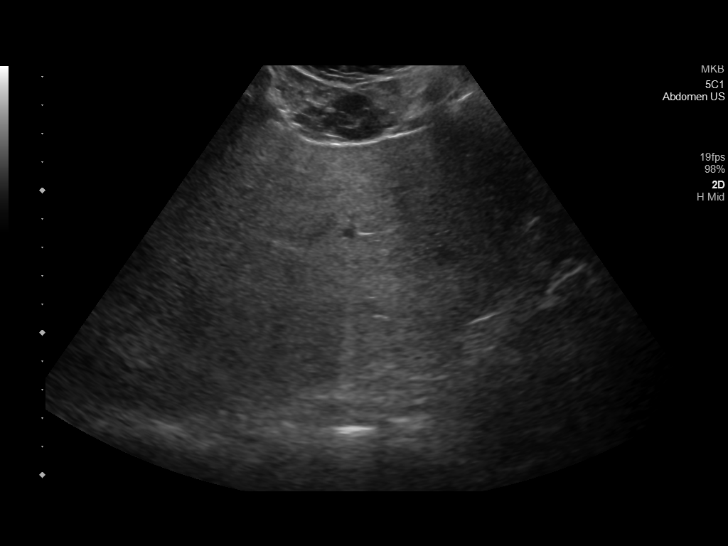
[im 40/53]
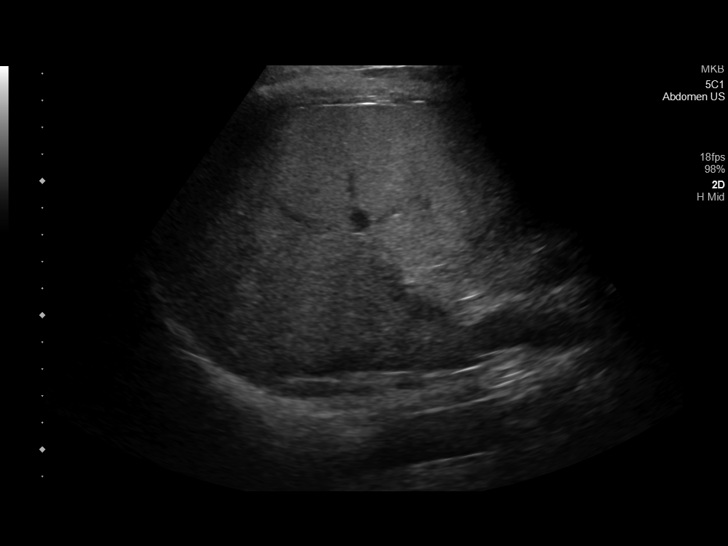
[im 44/53]
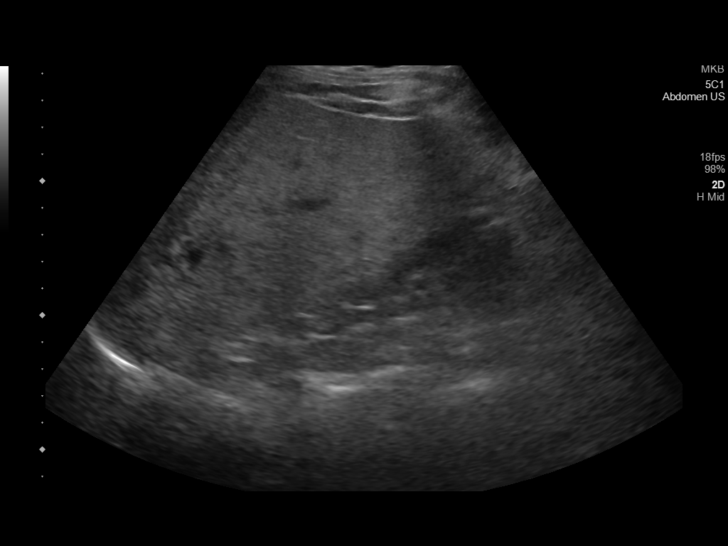
[im 48/53]
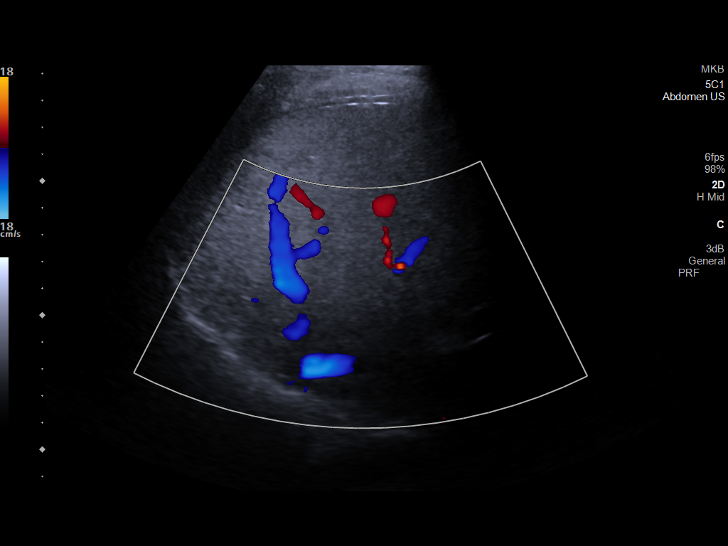
[im 53/53]
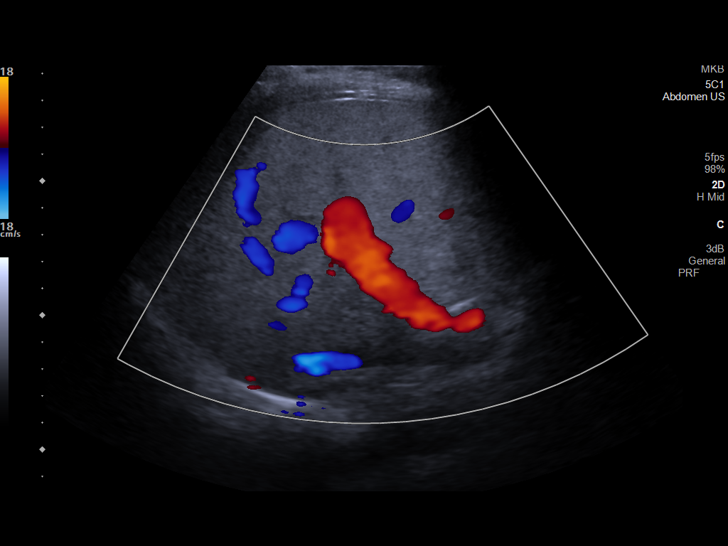

[14 of 25 positions shown; findings below may reference images not displayed]

FINDINGS: Gallbladder:

No gallstones or wall thickening visualized. No sonographic Murphy
sign noted by sonographer.

Common bile duct:

Diameter: 3.2 mm

Liver:

Diffusely echogenic. No focal hepatic abnormality. Portal vein is
patent on color Doppler imaging with normal direction of blood flow
towards the liver.

Other: None.
IMPRESSION: 1. Negative for gallstones.
2. Echogenic liver consistent with hepatic steatosis

## 2020-12-18 MED ORDER — METOCLOPRAMIDE HCL 10 MG PO TABS: 10.0000 mg | ORAL_TABLET | Freq: Four times a day (QID) | ORAL | 0 refills | Status: DC | PRN

## 2020-12-18 MED ORDER — METOCLOPRAMIDE HCL 5 MG/ML IJ SOLN
10.0000 mg | Freq: Once | INTRAMUSCULAR | Status: AC
  Administered 2020-12-18: 10 mg via INTRAVENOUS
  Filled 2020-12-18: qty 2

## 2020-12-18 MED ORDER — LAMOTRIGINE 25 MG PO TABS
250.0000 mg | ORAL_TABLET | ORAL | Status: AC
  Administered 2020-12-18: 250 mg via ORAL
  Filled 2020-12-18: qty 2

## 2020-12-18 MED ORDER — ONDANSETRON HCL 4 MG/2ML IJ SOLN
4.0000 mg | Freq: Once | INTRAMUSCULAR | Status: AC
  Administered 2020-12-18: 4 mg via INTRAVENOUS
  Filled 2020-12-18: qty 2

## 2020-12-18 MED ORDER — ALUMINUM-MAGNESIUM-SIMETHICONE 200-200-20 MG/5ML PO SUSP: 30.0000 mL | Freq: Three times a day (TID) | ORAL | 0 refills | Status: DC

## 2020-12-18 MED ORDER — FAMOTIDINE 20 MG PO TABS
20.0000 mg | ORAL_TABLET | Freq: Two times a day (BID) | ORAL | 0 refills | Status: DC
Start: 2020-12-18 — End: 2021-05-22

## 2020-12-18 MED ORDER — PANTOPRAZOLE SODIUM 40 MG IV SOLR
40.0000 mg | Freq: Once | INTRAVENOUS | Status: AC
  Administered 2020-12-18: 40 mg via INTRAVENOUS
  Filled 2020-12-18: qty 40

## 2020-12-18 MED ORDER — DEXTROSE 5 % IN LACTATED RINGERS IV BOLUS
1000.0000 mL | Freq: Once | INTRAVENOUS | Status: AC
  Administered 2020-12-18: 1000 mL via INTRAVENOUS
  Filled 2020-12-18: qty 1000

## 2020-12-18 NOTE — ED Triage Notes (Signed)
Pt to ED via POV c/o nausea and vomiting since Monday. Pt denies abdominal pain, diarrhea or fever. Pt states that he has not been able to eat or drink without vomiting since Monday.

## 2020-12-18 NOTE — ED Notes (Signed)
Dc ppw work provided to pt and family. IV removed. Pt assisted to [place] on foot

## 2020-12-18 NOTE — ED Provider Notes (Signed)
[facility] Emergency Department Provider Note  ____________________________________________  Time seen: Approximately 10:46 PM  I have reviewed the triage vital signs and the nursing notes.   HISTORY  Chief Complaint Emesis    HPI Stephen Beard is a 25 y.o. male with a history of ADHD, seizure disorder, alcohol abuse who comes the ED complaining of upper abdominal pain and vomiting for the past 3 days, unable to eat or drink anything without throwing up.  No diarrhea fevers chills or body aches.  He reports that the symptoms started after a night of heavy drinking.  Pain is nonradiating.  No alleviating factors.  No dizziness chest pain shortness of breath.    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[place]) 01/07/2020   Intentional overdose of drug in tablet form ([place]) 01/05/2020   Drug overdose, multiple drugs, intentional self-harm, initial encounter ([place]) 01/04/2020   Schizoaffective disorder, depressive type ([place]) 01/01/2020   Generalized anxiety disorder 01/01/2020   Severe episode of recurrent major depressive disorder, with psychotic features ([place]) 01/01/2020   Psychosis due to environmental factors as major part of etiology ([place])    Focal seizures ([place]) 11/07/2019   Auditory hallucinations 07/30/2019   Goals of care, counseling/discussion 07/19/2019   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[place]) 07/01/2019   Anaplastic oligodendroglioma, 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[place]) 07/01/2019   Brain mass 06/30/2019   Attention deficit hyperactivity disorder (ADHD), combined type 03/23/2019   Mild episode of recurrent major depressive disorder ([place]) 03/23/2019   Tobacco abuse 10/04/2012    Allergic rhinitis 08/18/2012     Past Surgical History:  Procedure Laterality Date   APPLICATION OF CRANIAL NAVIGATION N/A 07/02/2019   Procedure: APPLICATION OF CRANIAL NAVIGATION;  Surgeon: Judith Part, MD;  Location: [place];  Service: Neurosurgery;  Laterality: N/A;   CRANIOTOMY N/A 07/03/2019   Procedure: CRANIOTOMY HEMATOMA EVACUATION SUBDURAL;  Surgeon: Judith Part, MD;  Location: [place];  Service: Neurosurgery;  Laterality: N/A;   CRANIOTOMY Right 07/03/2019   Procedure: CRANIOTOMY HEMATOMA EVACUATION SUBDURAL;  Surgeon: Judith Part, MD;  Location: [place];  Service: Neurosurgery;  Laterality: Right;   CRANIOTOMY Right 07/02/2019   Procedure: CRANIOTOMY TUMOR EXCISION with Lucky Rathke;  Surgeon: Judith Part, MD;  Location: [place];  Service: Neurosurgery;  Laterality: Right;  posterior     Prior to Admission medications   Medication Sig Start Date End Date Taking? Authorizing Provider  aluminum-magnesium hydroxide-simethicone (MAALOX) 017-510-25 MG/5ML SUSP Take 30 mLs by mouth 4 (four) times daily -  before meals and at bedtime. 12/18/20  Yes Carrie Mew, MD  famotidine (PEPCID) 20 MG tablet Take 1 tablet (20 mg total) by mouth 2 (two) times daily. 12/18/20  Yes Carrie Mew, MD  metoCLOPramide (REGLAN) 10 MG tablet Take 1 tablet (10 mg total) by mouth every 6 (six) hours as needed. 12/18/20  Yes Carrie Mew, MD  busPIRone (BUSPAR) 10 MG tablet Take 10 mg by mouth 2 (two) times daily. 10/27/20   [provider]  lamoTRIgine (LAMICTAL) 200 MG tablet Take 27m in AM, 2552min PM. 05/13/20   PaSalley SlaughterNP  lamoTRIgine (LAMICTAL) 25 MG tablet Take 2 tablets (  50 mg total) by mouth 2 (two) times daily. Take 2 tablets along with a 200 mg at bedtime. 11/26/20   Vaslow, Acey Lav, MD  OLANZapine (ZYPREXA) 20 MG tablet Take 1 tablet by mouth every evening. 10/27/20   [provider]  ondansetron (ZOFRAN) 8 MG tablet Take 1 tablet (8 mg  total) by mouth 2 (two) times daily as needed (nausea and vomiting). May take 30-60 minutes prior to Temodar administration if nausea/vomiting occurs. 07/02/20   [name], MD  sertraline (ZOLOFT) 50 MG tablet Take 1 tablet (50 mg total) by mouth daily. 05/12/20   Salley Slaughter, NP  traZODone (DESYREL) 100 MG tablet Take 1 tablet (100 mg total) by mouth at bedtime as needed for sleep. 12/08/20   [name], MD     Allergies Patient has no known allergies.   No family history on file.  Social History Social History   Tobacco Use   Smoking status: Every Day    Packs/day: 1.00    Years: 12.00    Pack years: 12.00    Types: Cigarettes   Smokeless tobacco: Current    Types: Chew  Vaping Use   Vaping Use: Never used  Substance Use Topics   Alcohol use: Yes    Comment: last drink: "3 nights ago" when he had "1/4th of a bottle," has been drinking daily for several weeks from a "few beers to a bottle of liquor"   Drug use: Yes    Types: Cocaine    Comment: last use "couple of weeks ago" when he had "2 lines,"  frequency "once in a blue moon"    Review of Systems  Constitutional:   No fever or chills.  ENT:   No sore throat. No rhinorrhea. Cardiovascular:   No chest pain or syncope. Respiratory:   No dyspnea or cough. Gastrointestinal: Positive as above for abdominal pain and vomiting.  No constipation Musculoskeletal:   Negative for focal pain or swelling All other systems reviewed and are negative except as documented above in ROS and HPI.  ____________________________________________   PHYSICAL EXAM:  VITAL SIGNS: ED Triage Vitals  Enc Vitals Group     BP 12/18/20 1723 (!) 131/99     Pulse Rate 12/18/20 1723 93     Resp 12/18/20 1723 16     Temp 12/18/20 1723 97.9 F (36.6 C)     Temp Source 12/18/20 1723 Oral     SpO2 12/18/20 1723 94 %     Weight 12/18/20 1828 156 lb 12 oz (71.1 kg)     Height 12/18/20 1828 _0  (1.702 m)     Head Circumference  --      Peak Flow --      Pain Score 12/18/20 1735 0     Pain Loc --      Pain Edu? --      Excl. in [place]? --     Vital signs reviewed, nursing assessments reviewed.   Constitutional:   Alert and oriented. Non-toxic appearance. Eyes:   Conjunctivae are normal. EOMI. PERRL. ENT      Head:   Normocephalic and atraumatic.      Nose: Normal.      Mouth/Throat:   Dry mucous membranes      Neck:   No meningismus. Full ROM. Hematological/Lymphatic/Immunilogical:   No cervical lymphadenopathy. Cardiovascular:   RRR. Symmetric bilateral radial and DP pulses.  No murmurs. Cap refill less than 2 seconds. Respiratory:   Normal respiratory  effort without tachypnea/retractions. Breath sounds are clear and equal bilaterally. No wheezes/rales/rhonchi. Gastrointestinal:   Soft with diffuse upper abdominal tenderness.. Non distended. There is no CVA tenderness.  No rebound, rigidity, or guarding. Genitourinary:   deferred Musculoskeletal:   Normal range of motion in all extremities. No joint effusions.  No lower extremity tenderness.  No edema. Neurologic:   Normal speech and language.  Motor grossly intact. No acute focal neurologic deficits are appreciated.  Skin:    Skin is warm, dry and intact. No rash noted.  No petechiae, purpura, or bullae.  ____________________________________________    LABS (pertinent positives/negatives) (all labs ordered are listed, but only abnormal results are displayed) Labs Reviewed  COMPREHENSIVE METABOLIC PANEL - Abnormal; Notable for the following components:      Result Value   Sodium 132 (*)    Potassium 3.1 (*)    Chloride 90 (*)    Glucose, Bld 114 (*)    Total Protein 8.7 (*)    Albumin 5.1 (*)    AST 227 (*)    ALT 193 (*)    Total Bilirubin 1.5 (*)    All other components within normal limits  CBC - Abnormal; Notable for the following components:   Hemoglobin 19.0 (*)    MCHC 36.9 (*)    All other components within normal limits  LIPASE,  BLOOD  URINALYSIS, COMPLETE (UACMP) WITH MICROSCOPIC   ____________________________________________   EKG    ____________________________________________    RADIOLOGY  US Abdomen Limited RUQ (LIVER/GB)  Result Date: 12/18/2020 CLINICAL DATA:  Vomiting EXAM: ULTRASOUND ABDOMEN LIMITED RIGHT UPPER QUADRANT COMPARISON:  None. FINDINGS: Gallbladder: No gallstones or wall thickening visualized. No sonographic Murphy sign noted by sonographer. Common bile duct: Diameter: 3.2 mm Liver: Diffusely echogenic. No focal hepatic abnormality. Portal vein is patent on color Doppler imaging with normal direction of blood flow towards the liver. Other: None. IMPRESSION: 1. Negative for gallstones. 2. Echogenic liver consistent with hepatic steatosis Electronically Signed   By: Donavan Foil M.D.   On: 12/18/2020 21:53    ____________________________________________   PROCEDURES Procedures  ____________________________________________  DIFFERENTIAL DIAGNOSIS   Dehydration, electrolyte normality, cholecystitis, pancreatitis  CLINICAL IMPRESSION / ASSESSMENT AND PLAN / ED COURSE  Medications ordered in the ED: Medications  dextrose 5% lactated ringers bolus 1,000 mL (1,000 mLs Intravenous New Bag/Given 12/18/20 2119)  ondansetron (ZOFRAN) injection 4 mg (4 mg Intravenous Given 12/18/20 2111)  metoCLOPramide (REGLAN) injection 10 mg (10 mg Intravenous Given 12/18/20 2111)  pantoprazole (PROTONIX) injection 40 mg (40 mg Intravenous Given 12/18/20 2107)  lamoTRIgine (LAMICTAL) tablet 250 mg (250 mg Oral Given 12/18/20 2231)    Pertinent labs & imaging results that were available during my care of the patient were reviewed by me and considered in my medical decision making (see chart for details).  Stephen Beard was evaluated in Emergency Department on 12/18/2020 for the symptoms described in the history of present illness. He was evaluated in the context of the global COVID-19 pandemic, which  necessitated consideration that the patient might be at risk for infection with the SARS-CoV-2 virus that causes COVID-19. Institutional protocols and algorithms that pertain to the evaluation of patients at risk for COVID-19 are in a state of rapid change based on information released by regulatory bodies including the CDC and federal and state organizations. These policies and algorithms were followed during the patient's care in the ED.   Patient presents with upper abdominal pain and vomiting after heavy drinking.  Vital  signs are unremarkable.  Labs show evidence of dehydration.  LFTs slightly elevated which I think is due to vomiting.  Ultrasound of the right upper quadrant is unremarkable.  After IV fluids and antiemetics, patient is feeling much better.  He is tolerating oral intake and is able to take his seizure medicine in the ED.  Stable for discharge home.      ____________________________________________   FINAL CLINICAL IMPRESSION(S) / ED DIAGNOSES    Final diagnoses:  Vomiting  Epigastric pain  Acute alcoholic gastritis without hemorrhage     ED Discharge Orders          Ordered    metoCLOPramide (REGLAN) 10 MG tablet  Every 6 hours PRN        12/18/20 2245    famotidine (PEPCID) 20 MG tablet  2 times daily        12/18/20 2246    aluminum-magnesium hydroxide-simethicone (MAALOX) 200-200-20 MG/5ML SUSP  3 times daily before meals & bedtime        12/18/20 2246            Portions of this note were generated with dragon dictation software. Dictation errors may occur despite best attempts at proofreading.    Carrie Mew, MD 12/18/20 2250

## 2020-12-18 NOTE — ED Notes (Signed)
Called x5 and unable to find in lobby

## 2021-01-06 ENCOUNTER — Emergency Department (HOSPITAL_COMMUNITY)
Admission: EM | Admit: 2021-01-06 | Discharge: 2021-01-06 | Disposition: A | Discharge status: Home / Self Care | Payer: Medicaid Other | Attending: Emergency Medicine | Admitting: Emergency Medicine

## 2021-01-06 ENCOUNTER — Encounter (HOSPITAL_COMMUNITY): Payer: Self-pay

## 2021-01-06 DIAGNOSIS — R569 Unspecified convulsions: Secondary | ICD-10-CM | POA: Diagnosis not present

## 2021-01-06 DIAGNOSIS — F17210 Nicotine dependence, cigarettes, uncomplicated: Secondary | ICD-10-CM | POA: Insufficient documentation

## 2021-01-06 LAB — BASIC METABOLIC PANEL
Anion gap: 10 (ref 5–15)
BUN: 9 mg/dL (ref 6–20)
CO2: 24 mmol/L (ref 22–32)
Calcium: 9.3 mg/dL (ref 8.9–10.3)
Chloride: 101 mmol/L (ref 98–111)
Creatinine, Ser: 0.95 mg/dL (ref 0.61–1.24)
GFR, Estimated: >60 mL/min (ref >60)
Glucose, Bld: 108 mg/dL — ABNORMAL HIGH (ref 70–99)
Potassium: 4.5 mmol/L (ref 3.5–5.1)
Sodium: 135 mmol/L (ref 135–145)

## 2021-01-06 LAB — CBC
HCT: 48.3 % (ref 39.0–52.0)
Hemoglobin: 17.1 g/dL — ABNORMAL HIGH (ref 13.0–17.0)
MCH: 34.1 pg — ABNORMAL HIGH (ref 26.0–34.0)
MCHC: 35.4 g/dL (ref 30.0–36.0)
MCV: 96.4 fL (ref 80.0–100.0)
Platelets: 194 10*3/uL (ref 150–400)
RBC: 5.01 MIL/uL (ref 4.22–5.81)
RDW: 12.6 % (ref 11.5–15.5)
WBC: 9 10*3/uL (ref 4.0–10.5)
nRBC: 0 % (ref 0.0–0.2)

## 2021-01-06 MED ORDER — LAMOTRIGINE 150 MG PO TABS
250.0000 mg | ORAL_TABLET | Freq: Once | ORAL | Status: AC
  Administered 2021-01-06: 250 mg via ORAL
  Filled 2021-01-06: qty 1

## 2021-01-06 NOTE — Discharge Instructions (Addendum)
Please read and follow all provided instructions.  Your diagnoses today include:  1. Seizure ([place])     Tests performed today include: Complete blood cell count: slightly high red blood cells Basic metabolic panel: normal Vital signs. See below for your results today.   Medications prescribed:  None  Take any prescribed medications only as directed.  Home care instructions:  Follow any educational materials contained in this packet.  Please continue to get plenty of rest and take your Lamictal as prescribed daily.  Follow-up instructions: Please follow-up with Dr. Mickeal Skinner as neede  Return instructions:  Please return to the Emergency Department if you experience worsening symptoms.  Please return if you have any other emergent concerns.  Additional Information:  Your vital signs today were: BP 118/78   Pulse 69   Temp 98.9 F (37.2 C) (Oral)   Resp (!) 21   Ht 5\' 7"  (1.702 m)   Wt 72.6 kg   SpO2 96%   BMI 25.06 kg/m  If your blood pressure (BP) was elevated above 135/85 this visit, please have this repeated by your doctor within one month. --------------

## 2021-01-06 NOTE — ED Triage Notes (Signed)
Came in by EMS for 2 seizure episodes at home while sitting on the couch. Known hx of seizures, brain cancer and takes Keppra. Reports not taking Keppra x 2 days. Pt finished chemo/radiation tx on 1st week of October. Alert and oriented x 4 now.

## 2021-01-06 NOTE — ED Provider Notes (Signed)
[facility] EMERGENCY DEPARTMENT Provider Note   CSN: [id number] Arrival date & time: 01/06/21  6378     History Chief Complaint  Patient presents with   Seizures    Stephen Beard is a 25 y.o. male.  Patient with history of right-sided parieto-occipital anaplastic oligodendroglioma, subsequent left-sided homonymous hemianopsia, seizure disorder treated with Lamictal 259m bid (increased Dr. VMickeal Skinner9/2022), status post surgical resection, radiation therapy, recently completed Temodar cycles -- presents to the emergency department today after having two seizures.  Patient reports having focal seizures described as having a burning smell and a sensation that he just looked past.  This occurred last night before bed.  Also he had an episode this morning prompting transport by EMS.  He states that his last seizure was a couple of months ago.  Typically he does not get clusters of seizures.  He does report missing 2 days of lamotrigine --he states that he just forgot to take the medication.  He also does endorse alcohol use, last used 2 days ago.  No benzodiazepine or tramadol use.  Currently feels that he has back to his baseline.  No recent medical illnesses or complaints.      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[place]    Headache    Psychotic disorder ([place]    Seizures ([place]     Patient Active Problem List   Diagnosis Date Noted   Alcohol abuse 01/11/2020   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[place] 01/07/2020   Intentional overdose of drug in tablet form ([place] 01/05/2020   Drug overdose, multiple drugs, intentional self-harm, initial encounter ([place] 01/04/2020   Schizoaffective disorder, depressive type ([place] 01/01/2020   Generalized anxiety disorder 01/01/2020   Severe episode of recurrent major depressive disorder, with psychotic features ([place] 01/01/2020   Psychosis due to  environmental factors as major part of etiology ([place]    Focal seizures ([place] 11/07/2019   Auditory hallucinations 07/30/2019   Goals of care, counseling/discussion 07/19/2019   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[place] 07/01/2019   Anaplastic oligodendroglioma, 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[place] 07/01/2019   Brain mass 06/30/2019   Attention deficit hyperactivity disorder (ADHD), combined type 03/23/2019   Mild episode of recurrent major depressive disorder ([place] 03/23/2019   Tobacco abuse 10/04/2012   Allergic rhinitis 08/18/2012    Past Surgical History:  Procedure Laterality Date   APPLICATION OF CRANIAL NAVIGATION N/A 07/02/2019   Procedure: APPLICATION OF CRANIAL NAVIGATION;  Surgeon: OJudith Part MD;  [place]  Service: Neurosurgery;  Laterality: N/A;   CRANIOTOMY N/A 07/03/2019   Procedure: CRANIOTOMY HEMATOMA EVACUATION SUBDURAL;  Surgeon: OJudith Part MD;  [place]  Service: Neurosurgery;  Laterality: N/A;   CRANIOTOMY Right 07/03/2019   Procedure: CRANIOTOMY HEMATOMA EVACUATION SUBDURAL;  Surgeon: OJudith Part MD;  [place]  Service: Neurosurgery;  Laterality: Right;   CRANIOTOMY Right 07/02/2019   Procedure: CRANIOTOMY TUMOR EXCISION with BLucky Rathke  Surgeon: OJudith Part MD;  [place]  Service: Neurosurgery;  Laterality: Right;  posterior       No family history on file.  Social History   Tobacco Use   Smoking status: Every Day    Packs/day: 1.00    Years: 12.00    Pack years: 12.00    Types: Cigarettes   Smokeless tobacco: Current    Types: Chew  Vaping Use   Vaping Use:  Never used  Substance Use Topics   Alcohol use: Yes    Comment: last drink: "3 nights ago" when he had "1/4th of a bottle," has been drinking daily for several weeks from a "few beers to a bottle of liquor"   Drug use: Yes    Types: Cocaine    Comment: last use "couple of weeks ago" when he had "2 lines,"  frequency "once in a  blue moon"    Home Medications Prior to Admission medications   Medication Sig Start Date End Date Taking? Authorizing Provider  aluminum-magnesium hydroxide-simethicone (MAALOX) [phone] MG/5ML SUSP Take 30 mLs by mouth 4 (four) times daily -  before meals and at bedtime. 12/18/20   Carrie Mew, MD  busPIRone (BUSPAR) 10 MG tablet Take 10 mg by mouth 2 (two) times daily. 10/27/20   [provider]  famotidine (PEPCID) 20 MG tablet Take 1 tablet (20 mg total) by mouth 2 (two) times daily. 12/18/20   Carrie Mew, MD  lamoTRIgine (LAMICTAL) 200 MG tablet Take 240m in AM, 2513min PM. 05/13/20   PaSalley SlaughterNP  lamoTRIgine (LAMICTAL) 25 MG tablet Take 2 tablets (50 mg total) by mouth 2 (two) times daily. Take 2 tablets along with a 200 mg at bedtime. 11/26/20   Vaslow, ZaAcey LavMD  metoCLOPramide (REGLAN) 10 MG tablet Take 1 tablet (10 mg total) by mouth every 6 (six) hours as needed. 12/18/20   StCarrie MewMD  OLANZapine (ZYPREXA) 20 MG tablet Take 1 tablet by mouth every evening. 10/27/20   [provider]  ondansetron (ZOFRAN) 8 MG tablet Take 1 tablet (8 mg total) by mouth 2 (two) times daily as needed (nausea and vomiting). May take 30-60 minutes prior to Temodar administration if nausea/vomiting occurs. 07/02/20   VaVentura SellersMD  sertraline (ZOLOFT) 50 MG tablet Take 1 tablet (50 mg total) by mouth daily. 05/12/20   PaSalley SlaughterNP  traZODone (DESYREL) 100 MG tablet Take 1 tablet (100 mg total) by mouth at bedtime as needed for sleep. 12/08/20   VaVentura SellersMD    Allergies    Patient has no known allergies.  Review of Systems   Review of Systems  Constitutional:  Negative for fever.  HENT:  Negative for rhinorrhea and sore throat.   Eyes:  Positive for visual disturbance (stable). Negative for redness.  Respiratory:  Negative for cough.   Cardiovascular:  Negative for chest pain.  Gastrointestinal:  Negative for abdominal  pain, diarrhea, nausea and vomiting.  Genitourinary:  Negative for dysuria and hematuria.  Musculoskeletal:  Negative for myalgias.  Skin:  Negative for rash.  Neurological:  Positive for seizures. Negative for weakness and headaches.   Physical Exam Updated Vital Signs BP 118/80 (BP Location: Right Arm)   Pulse 90   Temp 98.9 F (37.2 C) (Oral)   Resp 16   SpO2 100%   Physical Exam Vitals and nursing note reviewed.  Constitutional:      General: He is not in acute distress.    Appearance: He is well-developed.  HENT:     Head: Normocephalic and atraumatic.     Right Ear: Tympanic membrane, ear canal and external ear normal.     Left Ear: Tympanic membrane, ear canal and external ear normal.     Nose: Nose normal.     Mouth/Throat:     Pharynx: Uvula midline.  Eyes:     General: Lids are normal.  Right eye: No discharge.        Left eye: No discharge.     Conjunctiva/sclera: Conjunctivae normal.     Pupils: Pupils are equal, round, and reactive to light.     Comments: L pupil is slightly sluggish compared to right  Cardiovascular:     Rate and Rhythm: Normal rate and regular rhythm.     Heart sounds: Normal heart sounds.  Pulmonary:     Effort: Pulmonary effort is normal.     Breath sounds: Normal breath sounds.  Abdominal:     Palpations: Abdomen is soft.     Tenderness: There is no abdominal tenderness.  Musculoskeletal:        General: Normal range of motion.     Cervical back: Normal range of motion and neck supple. No tenderness or bony tenderness.  Skin:    General: Skin is warm and dry.  Neurological:     Mental Status: He is alert and oriented to person, place, and time.     GCS: GCS eye subscore is 4. GCS verbal subscore is 5. GCS motor subscore is 6.     Cranial Nerves: No cranial nerve deficit.     Sensory: No sensory deficit.     Motor: No abnormal muscle tone.     Coordination: Coordination normal.     Gait: Gait normal.     Deep Tendon  Reflexes: Reflexes are normal and symmetric.    ED Results / Procedures / Treatments   Labs (all labs ordered are listed, but only abnormal results are displayed) Labs Reviewed  CBC - Abnormal; Notable for the following components:      Result Value   Hemoglobin 17.1 (*)    MCH 34.1 (*)    All other components within normal limits  BASIC METABOLIC PANEL - Abnormal; Notable for the following components:   Glucose, Bld 108 (*)    All other components within normal limits    ED ECG REPORT   Date: 01/06/2021  Rate: 87  Rhythm: normal sinus rhythm  QRS Axis: normal  Intervals: normal  ST/T Wave abnormalities: normal  Conduction Disutrbances:none  Narrative Interpretation:   Old EKG Reviewed: unchanged  I have personally reviewed the EKG tracing and agree with the computerized printout as noted.   Radiology No results found.  Procedures Procedures   Medications Ordered in ED Medications - No data to display  ED Course  I have reviewed the triage vital signs and the nursing notes.  Pertinent labs & imaging results that were available during my care of the patient were reviewed by me and considered in my medical decision making (see chart for details).  Patient seen and examined. Work-up initiated. Medications ordered. Previous neurooncology notes reviewed. EKG reviewed.   Vital signs reviewed and are as follows: BP 118/80 (BP Location: Right Arm)   Pulse 90   Temp 98.9 F (37.2 C) (Oral)   Resp 16   Ht 5' 7"  (1.702 m)   Wt 72.6 kg   SpO2 100%   BMI 25.06 kg/m   11:58 AM Elevated hgb noted. He is drinking, I gave him ice water earlier. Will continue oral hydration.   1:38 PM Patient stable during ED stay.  I did talk with his mother over the phone.  She was not with the patient this morning but does report that he was having his typical seizure activities and then went into his room and fell out on his bed.  This was witnessed by  patient's stepfather who relayed  the information to the patient's mom.  She confirmed that his symptoms today are not atypical for his usual seizure spells, except that it might of been a little longer today.  She agreed that seizures were likely related to not taking his medications as he was at his sister's house.  Patient continues to be back at his baseline.  When asked, he has no concerns about going home today.  He states that he is willing to take his medications as prescribed.  Encouraged rest, good hydration, follow-up with Dr. Mickeal Skinner as needed.    MDM Rules/Calculators/A&P                           Patient with known oligodendroglioma, currently followed by cancer center, with seizures secondary to this.  Patient has been controlled on lamotrigine.  He has missed a couple of days of this.  He also reports some recent alcohol use.  Suspect that his seizure today due to medication noncompliance.  As such, I do not feel he requires any dosage adjustments at this point.  He has not had any further seizure activity while in the emergency department.  Plan for discharged home.  Final Clinical Impression(s) / ED Diagnoses Final diagnoses:  Seizure [facility])    Rx / [place] Orders ED Discharge Orders     None        Carlisle Cater, PA-C 01/06/21 1341    Fredia Sorrow, MD 01/07/21 [id number]

## 2021-01-07 ENCOUNTER — Other Ambulatory Visit: Payer: Self-pay | Admitting: Radiation Therapy

## 2021-01-12 ENCOUNTER — Other Ambulatory Visit: Payer: Self-pay | Admitting: Internal Medicine

## 2021-01-12 ENCOUNTER — Telehealth: Payer: Self-pay | Admitting: *Deleted

## 2021-01-12 DIAGNOSIS — F411 Generalized anxiety disorder: Secondary | ICD-10-CM

## 2021-01-12 DIAGNOSIS — F251 Schizoaffective disorder, depressive type: Secondary | ICD-10-CM

## 2021-01-12 MED ORDER — LAMOTRIGINE 200 MG PO TABS
ORAL_TABLET | ORAL | 2 refills | Status: DC
Start: 2021-01-12 — End: 2021-02-15

## 2021-01-12 MED ORDER — LAMOTRIGINE 25 MG PO TABS: 50.0000 mg | ORAL_TABLET | Freq: Two times a day (BID) | ORAL | 3 refills | Status: DC

## 2021-01-12 MED ORDER — TRAZODONE HCL 100 MG PO TABS
100.0000 mg | ORAL_TABLET | Freq: Every evening | ORAL | 3 refills | Status: DC | PRN
Start: 2021-01-12 — End: 2021-02-15

## 2021-01-12 MED ORDER — SERTRALINE HCL 50 MG PO TABS: 50.0000 mg | ORAL_TABLET | Freq: Every day | ORAL | 2 refills | Status: DC

## 2021-01-12 NOTE — Telephone Encounter (Signed)
Patient called and requesting refill for Trazodone 100 mg daily.   Lamictal 250 mg BID Zoloft 50 mg daily  Routing to provider to review for refill To [facility]

## 2021-01-25 ENCOUNTER — Telehealth: Payer: Self-pay | Admitting: *Deleted

## 2021-01-25 ENCOUNTER — Ambulatory Visit (HOSPITAL_COMMUNITY): Admission: RE | Admit: 2021-01-25 | Payer: Medicaid Other | Source: Ambulatory Visit

## 2021-01-25 NOTE — Telephone Encounter (Signed)
Patient called to report that he started working again at [employer].  He reports being seen by his PCP Windell Hummingbird, PA with [facility].  He states that he has asked to be put back on his ADHD medicine Adderall and that provider was going to reach out to Dr Mickeal Skinner to find out if it would be safe for him to restart this medication.   Patient wanted to let Dr Mickeal Skinner know and wanted his opinion on if he should be able to restart.

## 2021-01-26 ENCOUNTER — Inpatient Hospital Stay: Payer: Medicaid Other | Admitting: Internal Medicine

## 2021-01-26 NOTE — Telephone Encounter (Signed)
Windell Hummingbird, PA from [facility] called.  She called it discuss the medication that she is considering represcribing to the patient and wanted to discuss that directly with the provider.  Routed phone number directly to provider to reach out to her to discuss.

## 2021-02-01 ENCOUNTER — Inpatient Hospital Stay: Payer: Medicaid Other

## 2021-02-05 ENCOUNTER — Ambulatory Visit (HOSPITAL_COMMUNITY)
Admission: RE | Admit: 2021-02-05 | Discharge: 2021-02-05 | Disposition: A | Discharge status: Home / Self Care | Payer: Medicaid Other | Source: Ambulatory Visit | Attending: Internal Medicine | Admitting: Internal Medicine

## 2021-02-05 ENCOUNTER — Inpatient Hospital Stay: Payer: Medicaid Other | Attending: Internal Medicine

## 2021-02-05 DIAGNOSIS — C714 Malignant neoplasm of occipital lobe: Secondary | ICD-10-CM | POA: Diagnosis present

## 2021-02-05 IMAGING — MR MR HEAD WO/W CM
15 series · 48 of 48 positions shown · IV contrast (GADAVIST)
Comparison: [DATE], [DATE], [DATE]

CLINICAL DATA: Oligodendroglioma of the occipital lobe. Assess
treatment response

EXAM:
MRI HEAD WITHOUT AND WITH CONTRAST
TECHNIQUE: Multiplanar, multiecho pulse sequences of the brain and surrounding
structures were obtained without and with intravenous contrast.
CONTRAST:  7.5mL GADAVIST GADOBUTROL 1 MMOL/ML IV SOLN

[Series 5: DWI · axial · 3.0mm · 1.36mm/px · z∈[-68,+72]mm · 4 of 96 slices shown (1 of 2)]
[im 1/96]
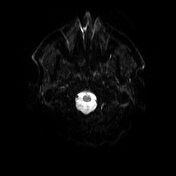
[im 32/96]
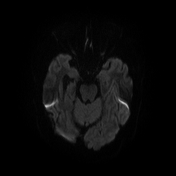
[im 64/96]
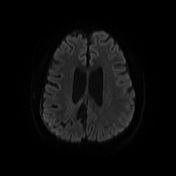
[im 96/96]
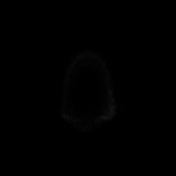

[Series 6: DWI · axial · 3.0mm · 1.36mm/px · z∈[-68,+72]mm · 3 of 48 slices shown (2 of 2)]
[im 1/48]
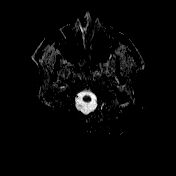
[im 24/48]
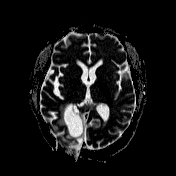
[im 48/48]
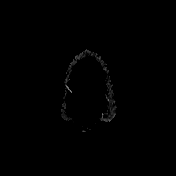

[Series 7: T1 · sagittal · 5.0mm · 0.75mm/px · 1 of 24 slices shown (1 of 4)]
[im 1/24]
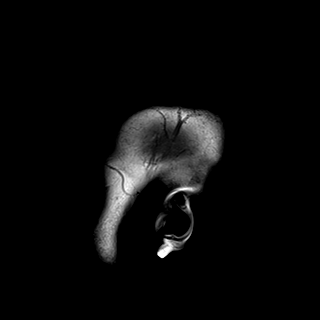

[Series 8: T2 · axial · 5.0mm · 0.62mm/px · 1 of 26 slices shown]
[im 1/26]
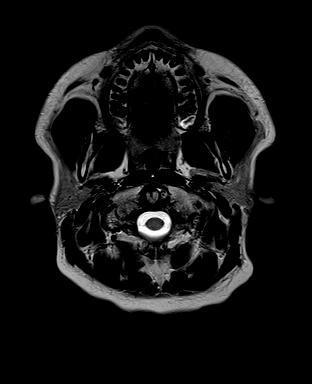

[Series 9: swi_images · axial · 3.0mm · 0.75mm/px · z∈[-84,+91]mm · 3 of 60 slices shown]
[im 1/60]
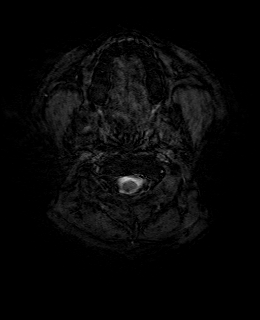
[im 30/60]
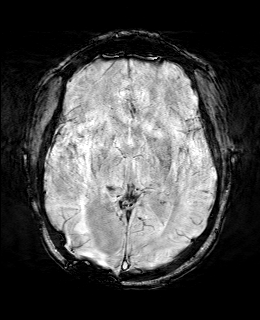
[im 60/60]
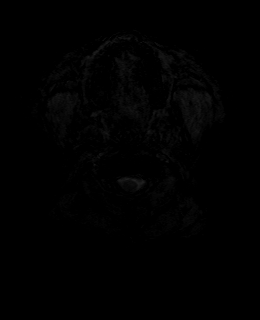

[Series 11: FLAIR · axial · 3.0mm · 0.75mm/px · z∈[-72,+79]mm · 3 of 52 slices shown]
[im 1/52]
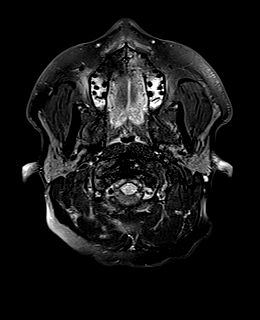
[im 26/52]
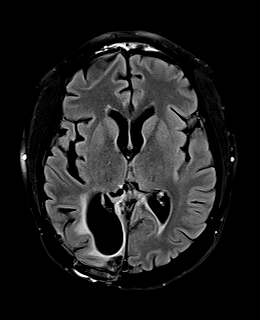
[im 52/52]
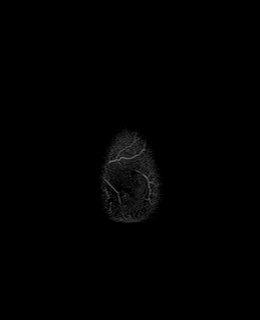

[Series 12: T1 · axial · 1.0mm · 0.94mm/px · z∈[-74,+84]mm · 9 of 160 slices shown (2 of 4)]
[im 1/160]
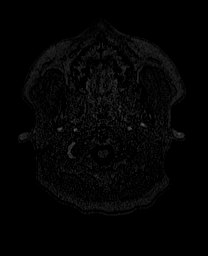
[im 20/160]
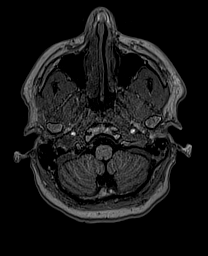
[im 40/160]
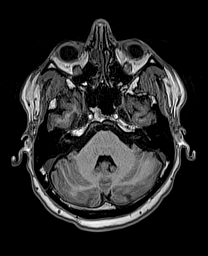
[im 60/160]
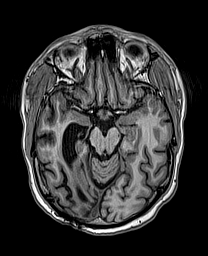
[im 80/160]
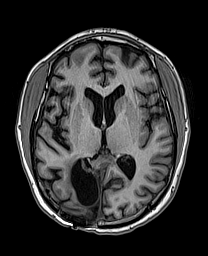
[im 100/160]
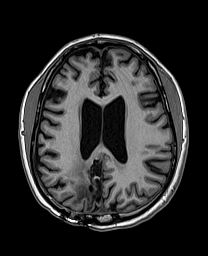
[im 120/160]
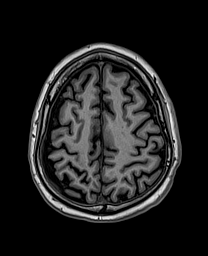
[im 140/160]
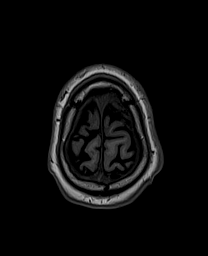
[im 160/160]
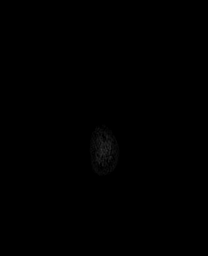

[Series 13: cor dwi_tracew · coronal · 5.0mm · 1.53mm/px · 3 of 60 slices shown]
[im 1/60]
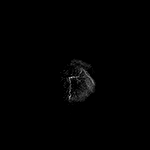
[im 30/60]
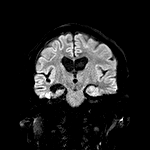
[im 60/60]
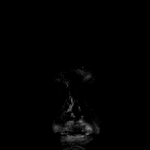

[Series 14: cor dwi_adc · coronal · 5.0mm · 1.53mm/px · 2 of 30 slices shown]
[im 1/30]
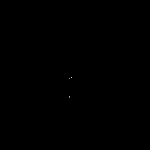
[im 30/30]
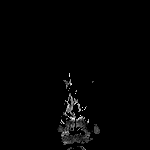

[Series 15: T2 post-contrast · coronal · 5.0mm · 0.57mm/px · 2 of 30 slices shown]
[im 1/30]
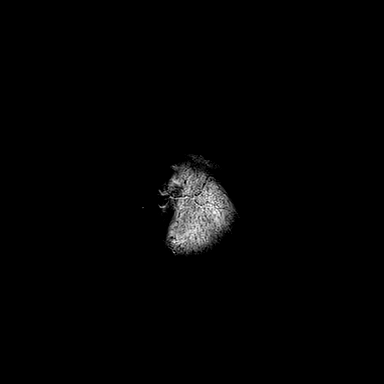
[im 30/30]
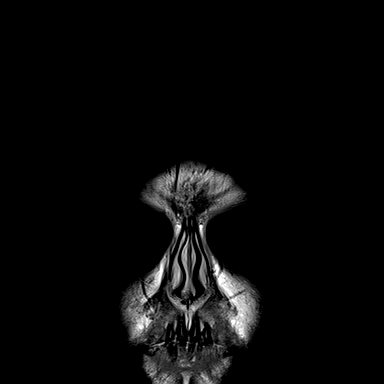

[Series 16: T1 post-contrast · axial · 1.0mm · 0.94mm/px · z∈[-74,+84]mm · 9 of 160 slices shown (1 of 3)]
[im 1/160]
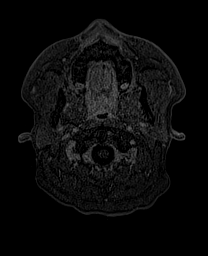
[im 20/160]
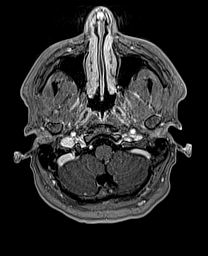
[im 40/160]
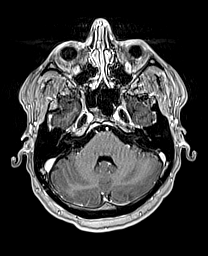
[im 60/160]
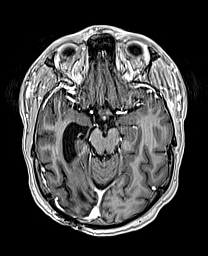
[im 80/160]
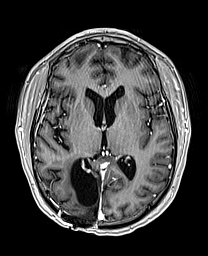
[im 100/160]
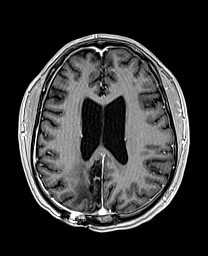
[im 120/160]
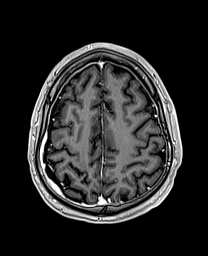
[im 140/160]
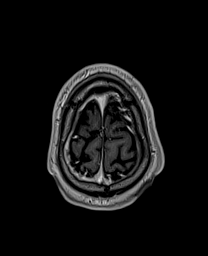
[im 160/160]
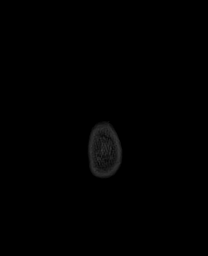

[Series 17: T1 · sagittal · 4.0mm · 0.94mm/px · 2 of 37 slices shown (3 of 4)]
[im 1/37]
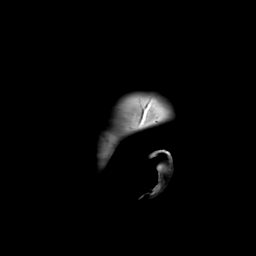
[im 37/37]
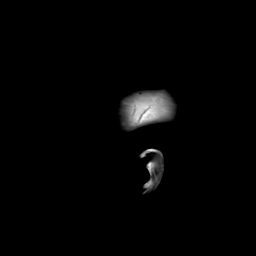

[Series 18: T1 · coronal · 4.0mm · 0.94mm/px · 3 of 46 slices shown (4 of 4)]
[im 1/46]
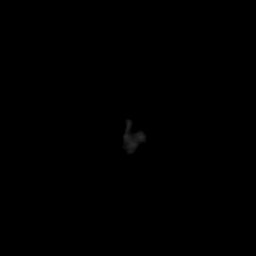
[im 23/46]
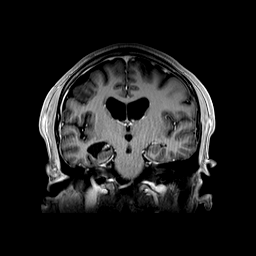
[im 46/46]
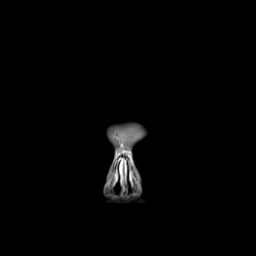

[Series 19: T1 post-contrast · coronal · 5.0mm · 0.43mm/px · 2 of 30 slices shown (2 of 3)]
[im 1/30]
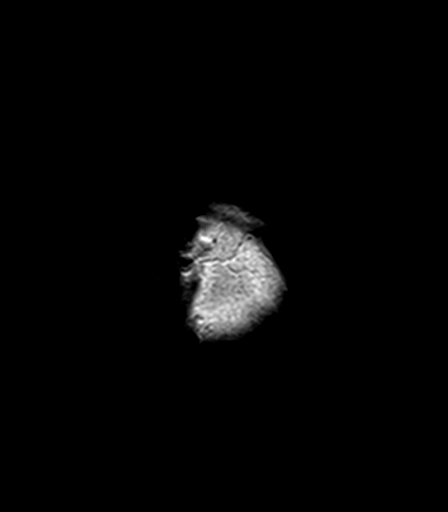
[im 30/30]
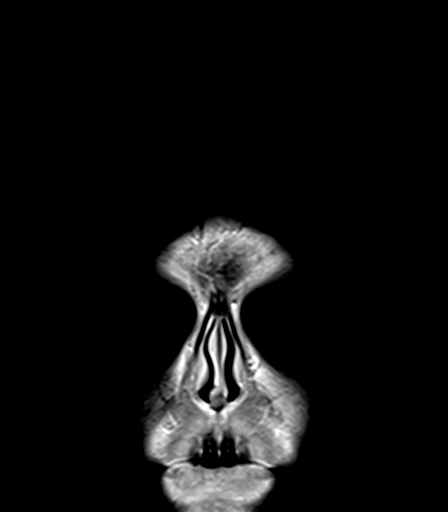

[Series 20: T1 post-contrast · sagittal · 5.0mm · 0.75mm/px · 1 of 24 slices shown (3 of 3)]
[im 1/24]
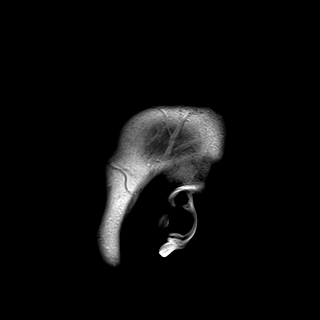

[48 of 48 positions shown; findings below may reference images not displayed]

FINDINGS: Brain: Status post right parieto-occipital craniotomy with subjacent
resection cavity in the right occipital lobe, with ex vacuo
dilatation of the right lateral ventricle. Minimal linear
subependymal enhancement along the posterior margin of the right
occipital horn (series 16, image 67), which appears slightly
increased since the prior exam and was not definitively present on
[DATE]. Other small foci of enhancement about the resection
cavity are unchanged (for example series 16, image 95). Increased T2
hyperintense signal in the adjacent parenchyma appears unchanged.

No acute infarct, mass effect, or midline shift. No acute
hemorrhage. Redemonstrated dural enhancement over the right cerebral
convexity and small right cerebral convexity subdural fluid
collection, unchanged.

Vascular: Normal flow voids.

Skull and upper cervical spine: Right parietooccipital craniotomy.
Otherwise normal marrow signal.

Sinuses/Orbits: Negative.

Other: Trace fluid in mastoid air cells.
IMPRESSION: Status post right parietooccipital craniotomy and resection of a
subjacent mass. Unchanged appearance of the resection cavity with
minimal foci of enhancement surrounding T2 hyperintense signal.
Minimal subependymal enhancement in the right occipital horn appears
slightly increased from the prior exam. Attention on follow-up.

## 2021-02-05 MED ORDER — GADOBUTROL 1 MMOL/ML IV SOLN
7.5000 mL | Freq: Once | INTRAVENOUS | Status: AC | PRN
  Administered 2021-02-05: 7.5 mL via INTRAVENOUS

## 2021-02-15 ENCOUNTER — Inpatient Hospital Stay: Payer: Medicaid Other

## 2021-02-15 ENCOUNTER — Other Ambulatory Visit: Payer: Self-pay

## 2021-02-15 ENCOUNTER — Other Ambulatory Visit (HOSPITAL_COMMUNITY): Payer: Self-pay | Admitting: Psychiatry

## 2021-02-15 ENCOUNTER — Inpatient Hospital Stay: Payer: Medicaid Other | Attending: Internal Medicine | Admitting: Internal Medicine

## 2021-02-15 VITALS — BP 130/97 | HR 80 | Temp 97.2°F | Resp 20 | Wt 140.6 lb

## 2021-02-15 DIAGNOSIS — F251 Schizoaffective disorder, depressive type: Secondary | ICD-10-CM | POA: Diagnosis not present

## 2021-02-15 DIAGNOSIS — R569 Unspecified convulsions: Secondary | ICD-10-CM | POA: Diagnosis not present

## 2021-02-15 DIAGNOSIS — C714 Malignant neoplasm of occipital lobe: Secondary | ICD-10-CM

## 2021-02-15 DIAGNOSIS — F411 Generalized anxiety disorder: Secondary | ICD-10-CM | POA: Diagnosis not present

## 2021-02-15 LAB — CBC WITH DIFFERENTIAL (CANCER CENTER ONLY)
Abs Immature Granulocytes: 0.01 10*3/uL (ref 0.00–0.07)
Basophils Absolute: 0 10*3/uL (ref 0.0–0.1)
Basophils Relative: 1 %
Eosinophils Absolute: 0 10*3/uL (ref 0.0–0.5)
Eosinophils Relative: 0 %
HCT: 49.4 % (ref 39.0–52.0)
Hemoglobin: 17.9 g/dL — ABNORMAL HIGH (ref 13.0–17.0)
Immature Granulocytes: 0 %
Lymphocytes Relative: 19 %
Lymphs Abs: 0.9 10*3/uL (ref 0.7–4.0)
MCH: 34.2 pg — ABNORMAL HIGH (ref 26.0–34.0)
MCHC: 36.2 g/dL — ABNORMAL HIGH (ref 30.0–36.0)
MCV: 94.3 fL (ref 80.0–100.0)
Monocytes Absolute: 0.5 10*3/uL (ref 0.1–1.0)
Monocytes Relative: 11 %
Neutro Abs: 3.1 10*3/uL (ref 1.7–7.7)
Neutrophils Relative %: 69 %
Platelet Count: 158 10*3/uL (ref 150–400)
RBC: 5.24 MIL/uL (ref 4.22–5.81)
RDW: 11.9 % (ref 11.5–15.5)
WBC Count: 4.5 10*3/uL (ref 4.0–10.5)
nRBC: 0 % (ref 0.0–0.2)

## 2021-02-15 LAB — CMP (CANCER CENTER ONLY)
ALT: 75 U/L — ABNORMAL HIGH (ref 0–44)
AST: 49 U/L — ABNORMAL HIGH (ref 15–41)
Albumin: 4.6 g/dL (ref 3.5–5.0)
Alkaline Phosphatase: 89 U/L (ref 38–126)
Anion gap: 13 (ref 5–15)
BUN: 8 mg/dL (ref 6–20)
CO2: 21 mmol/L — ABNORMAL LOW (ref 22–32)
Calcium: 9.2 mg/dL (ref 8.9–10.3)
Chloride: 102 mmol/L (ref 98–111)
Creatinine: 0.85 mg/dL (ref 0.61–1.24)
GFR, Estimated: >60 mL/min (ref >60)
Glucose, Bld: 97 mg/dL (ref 70–99)
Potassium: 3.8 mmol/L (ref 3.5–5.1)
Sodium: 136 mmol/L (ref 135–145)
Total Bilirubin: 0.7 mg/dL (ref 0.3–1.2)
Total Protein: 7.3 g/dL (ref 6.5–8.1)

## 2021-02-15 MED ORDER — LAMOTRIGINE 25 MG PO TABS: 50.0000 mg | ORAL_TABLET | Freq: Two times a day (BID) | ORAL | 3 refills | Status: DC

## 2021-02-15 MED ORDER — TRAZODONE HCL 100 MG PO TABS: 100.0000 mg | ORAL_TABLET | Freq: Every evening | ORAL | 3 refills | Status: DC | PRN

## 2021-02-15 MED ORDER — LAMOTRIGINE 200 MG PO TABS: ORAL_TABLET | ORAL | 2 refills | Status: DC

## 2021-02-15 MED ORDER — BUSPIRONE HCL 10 MG PO TABS
10.0000 mg | ORAL_TABLET | Freq: Two times a day (BID) | ORAL | 3 refills | Status: DC
Start: 2021-02-15 — End: 2021-12-01

## 2021-02-15 NOTE — Progress Notes (Signed)
[facility] at [place], [place] [address] [phone]   Interval Evaluation  Date of Service: 02/15/21 Patient Name: Stephen Beard Patient MRN: [id number] Patient DOB: [birth year] Provider: [name], MD  Identifying Statement:  Stephen Beard is a 25 y.o. male with right parietal  anaplastic oligodendoglioma    Oncologic History: Oncology History  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[facility])  07/02/2019 Surgery   Craniotomy, resection by Dr. Zada Finders. Followed by hematoma evacuation on 07/03/19.   08/05/2019 - 09/17/2019 Radiation Therapy   IMRT with concurrent Temodar 18m/m2 daily   10/15/2019 -  Chemotherapy    Patient is on Treatment Plan: BRAIN ANAPLASTIC GLIOMA GRADE III TEMOZOLOMIDE POST XRT Q28D         Biomarkers:  MGMT Unknown.  IDH 1/2 Mutated.  EGFR Unknown  1p/19q co-deleted   Interval History:  Stephen Beard presents today for follow up after recent MRI brain.  Breakthrough seizure cluster occurred in October, following period of Lamictal non-compliance.  There was no clear provocation identified.  Denies new or progressive deficits.  Occassional tension headaches only.  Currently "in between" psychiatry providers, [place]  H+P (07/19/19) Patient presented to medical attention with several months of progressive headaches and left sided visual impairment.  This was accompanied by episodes of deja-vu +paroxysmal burning smells which would occur almost daily over that time.  CNS imaging demonstrated large parieto-occipital mass with herniation syndrome.  Mass was resected/debulked, and post-operative course was complicated by intracranial hematoma and also respiratory failure.  At present, he is walking and talking, carries left sided visual impairment.  Currently on antibiotics for wound infection per Dr. OZada Finders  Not requiring steroids.  Currently on  lamictal 519mdaily.  Medications: Current Outpatient Medications on File Prior to Visit  Medication Sig Dispense Refill   aluminum-magnesium hydroxide-simethicone (MAALOX) 200-200-20 MG/5ML SUSP Take 30 mLs by mouth 4 (four) times daily -  before meals and at bedtime. 355 mL 0   famotidine (PEPCID) 20 MG tablet Take 1 tablet (20 mg total) by mouth 2 (two) times daily. 60 tablet 0   metoCLOPramide (REGLAN) 10 MG tablet Take 1 tablet (10 mg total) by mouth every 6 (six) hours as needed. 30 tablet 0   OLANZapine (ZYPREXA) 20 MG tablet Take 1 tablet by mouth every evening.     ondansetron (ZOFRAN) 8 MG tablet Take 1 tablet (8 mg total) by mouth 2 (two) times daily as needed (nausea and vomiting). May take 30-60 minutes prior to Temodar administration if nausea/vomiting occurs. 30 tablet 1   sertraline (ZOLOFT) 50 MG tablet Take 1 tablet (50 mg total) by mouth daily. 30 tablet 2   No current facility-administered medications on file prior to visit.    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    Headache    Psychotic disorder (HCC)    Seizures ([facility]   Past Surgical History:  Past Surgical History:  Procedure Laterality Date   APPLICATION OF CRANIAL NAVIGATION N/A 07/02/2019   Procedure: APPLICATION OF CRANIAL NAVIGATION;  Surgeon: OsJudith PartMD;  [facility];  Laterality: N/A;   CRANIOTOMY N/A 07/03/2019   Procedure: CRANIOTOMY HEMATOMA EVACUATION SUBDURAL;  Surgeon: OsJudith PartMD;  [facility]: Neuro[facility];  Laterality: N/A;   CRANIOTOMY Right 07/03/2019   Procedure: CRANIOTOMY HEMATOMA EVACUATION SUBDURAL;  Surgeon: Judith Part, MD;  Location: [facility];  Service: Neurosurgery;  Laterality: Right;   CRANIOTOMY Right 07/02/2019   Procedure: CRANIOTOMY TUMOR EXCISION with Lucky Rathke;  Surgeon:  Judith Part, MD;  Location: [facility];  Service: Neurosurgery;  Laterality: Right;  posterior   Social History:  Social History   Socioeconomic History   Marital status: Single    Spouse name: Not on file   Number of children: Not on file   Years of education: Not on file   Highest education level: Not on file  Occupational History   Not on file  Tobacco Use   Smoking status: Every Day    Packs/day: 1.00    Years: 12.00    Pack years: 12.00    Types: Cigarettes   Smokeless tobacco: Current    Types: Chew  Vaping Use   Vaping Use: Never used  Substance and Sexual Activity   Alcohol use: Yes    Comment: last drink: "3 nights ago" when he had "1/4th of a bottle," has been drinking daily for several weeks from a "few beers to a bottle of liquor"   Drug use: Yes    Types: Cocaine    Comment: last use "couple of weeks ago" when he had "2 lines,"  frequency "once in a blue moon"   Sexual activity: Not Currently  Other Topics Concern   Not on file  Social History Narrative   Not on file   Social Determinants of Health   Financial Resource Strain: Not on file  Food Insecurity: Not on file  Transportation Needs: Not on file  Physical Activity: Not on file  Stress: Not on file  Social Connections: Not on file  Intimate Partner Violence: Not on file   Family History: No family history on file.  Review of Systems: Constitutional: Doesn't report fevers, chills or abnormal weight loss Eyes: Doesn't report blurriness of vision Ears, nose, mouth, throat, and face: Doesn't report sore throat Respiratory: Doesn't report cough, dyspnea or wheezes Cardiovascular: Doesn't report palpitation, chest discomfort  Gastrointestinal:  Doesn't report nausea, constipation, diarrhea GU: Doesn't report incontinence Skin: Doesn't report skin rashes Neurological: Per HPI Musculoskeletal: Doesn't report joint pain Behavioral/Psych: Doesn't report anxiety  Physical Exam: Vitals:    02/15/21 1035  BP: (!) 130/97  Pulse: 80  Resp: 20  Temp: (!) 97.2 F (36.2 C)  SpO2: 99%   KPS: 80. General: Alert, cooperative, pleasant, in no acute distress Head: Normal EENT: No conjunctival injection or scleral icterus.  Lungs: Resp effort normal Cardiac: Regular rate Abdomen: Non-distended abdomen Skin: No rashes cyanosis or petechiae. Extremities: No clubbing or edema  Neurologic Exam: Mental Status: Awake, alert, attentive to examiner. Oriented to self and environment. Language is fluent with intact comprehension.  Cranial Nerves: Visual acuity is grossly normal. Left homonymous hemianopia. Extra-ocular movements intact. No ptosis. Face is symmetric Motor: Tone and bulk are normal. Power is full in both arms and legs. Reflexes are symmetric, no pathologic reflexes present.  Sensory: Intact to light touch Gait: Sensory dystaxia   Labs: I have reviewed the data as listed    Component Value Date/Time   NA 135 01/06/2021 1023   K 4.5 01/06/2021 1023   CL 101 01/06/2021 1023   CO2 24 01/06/2021 1023   GLUCOSE 108 (H) 01/06/2021 1023   BUN 9 01/06/2021 1023   CREATININE 0.95 01/06/2021 1023   CREATININE 0.78  05/28/2020 1413   CALCIUM 9.3 01/06/2021 1023   PROT 8.7 (H) 12/18/2020 1737   ALBUMIN 5.1 (H) 12/18/2020 1737   AST 227 (H) 12/18/2020 1737   AST 16 05/28/2020 1413   ALT 193 (H) 12/18/2020 1737   ALT 23 05/28/2020 1413   ALKPHOS 73 12/18/2020 1737   BILITOT 1.5 (H) 12/18/2020 1737   BILITOT 0.4 05/28/2020 1413   GFRNONAA >60 01/06/2021 1023   GFRNONAA >60 05/28/2020 1413   GFRAA >60 12/05/2019 1206   Lab Results  Component Value Date   WBC 9.0 01/06/2021   NEUTROABS 3.8 05/28/2020   HGB 17.1 (H) 01/06/2021   HCT 48.3 01/06/2021   MCV 96.4 01/06/2021   PLT 194 01/06/2021   Imaging:  [place] Clinician Interpretation: I have personally reviewed the CNS images as listed.  My interpretation, in the context of the patient's clinical presentation, is  treatment effect vs true progression  CLINICAL DATA:  Brain tumor. Assess treatment response. Oligodendroglioma of occipital lobe.   EXAM: MRI HEAD WITHOUT AND WITH CONTRAST   TECHNIQUE: Multiplanar, multiecho pulse sequences of the brain and surrounding structures were obtained without and with intravenous contrast.   CONTRAST:  6.41m GADAVIST GADOBUTROL 1 MMOL/ML IV SOLN   COMPARISON:  06/27/2020.  04/15/2020.  12/12/2019.   FINDINGS: Brain: Stable examination since the study of 4 months ago. No abnormality affects the brainstem or cerebellum. Left cerebral hemisphere is normal except for T2 and FLAIR signal of the splenium of the corpus callosum without enhancement as seen previously. Previous resection of mass in the right occipital lobe with ex vacuo enlargement of the occipital horn of the right lateral ventricle. Regional brain tissue does not show increasing mass effect. Stable T2 and FLAIR signal and stable regional cystic changes. No evidence new or worsening contrast enhancement. Chronic postoperative dural thickening on the right as seen previously. No sign of obstructive hydrocephalus.   Vascular: Major vessels at the base of the brain show flow.   Skull and upper cervical spine: Otherwise negative   Sinuses/Orbits: Clear/normal   Other: None   IMPRESSION: Stable examination since April of this year. No progressive or worsening finding. Postsurgical and post treatment changes in the right parieto-occipital brain.     Electronically Signed   By: MNelson ChimesM.D.   On: 10/21/2020 11:02  Assessment/Plan 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HCC) [C71.4]  Kristin L Clink is clinically stable today.  MRI brain demonstrates focus of linear enhancement along ependymal surface of right lateral ventricle, slightly progressed when compared to prior study.  Etiology is unclear, may be delayed radiation treatment effect.  Pattern is not most consistent with tumor  progression, though this must be considered as well.  We recommended continued imaging surveillance only at this time.    We will also check CBC and CMP today, given elevated liver enyzmes while in ED in October, no follow up since then.  Will continue to seek psychiatric care, mental health counseling at home.  For breakthrough seizures, we recommended continuing Lamictal 250/250.  We ask that BKamare CaspersBridgers return to clinic in 3 months following next brain MRI, or sooner as needed.    All questions were answered. The patient knows to call the clinic with any problems, questions or concerns. No barriers to learning were detected.  I have spent a total of 40 minutes of face-to-face and non-face-to-face time, excluding clinical staff time, preparing to see patient, ordering tests and/or medications, counseling the patient, and independently interpreting results  and communicating results to the patient/family/caregiver    [name], MD Medical Director of Neuro-Oncology [facility] at [place] 02/15/21 12:27 PM

## 2021-02-16 ENCOUNTER — Telehealth: Payer: Self-pay | Admitting: Internal Medicine

## 2021-02-16 NOTE — Telephone Encounter (Signed)
Spoke with pt to schedule appt per 12/5 los. Pt said he will call back to schedule appt

## 2021-02-26 ENCOUNTER — Encounter: Payer: Self-pay | Admitting: Emergency Medicine

## 2021-02-26 ENCOUNTER — Emergency Department
Admission: EM | Admit: 2021-02-26 | Discharge: 2021-02-26 | Disposition: A | Discharge status: Home / Self Care | Payer: Medicaid Other | Attending: Emergency Medicine | Admitting: Emergency Medicine

## 2021-02-26 ENCOUNTER — Other Ambulatory Visit: Payer: Self-pay

## 2021-02-26 DIAGNOSIS — S0181XA Laceration without foreign body of other part of head, initial encounter: Secondary | ICD-10-CM | POA: Insufficient documentation

## 2021-02-26 DIAGNOSIS — W01198A Fall on same level from slipping, tripping and stumbling with subsequent striking against other object, initial encounter: Secondary | ICD-10-CM | POA: Diagnosis not present

## 2021-02-26 DIAGNOSIS — F17210 Nicotine dependence, cigarettes, uncomplicated: Secondary | ICD-10-CM | POA: Diagnosis not present

## 2021-02-26 DIAGNOSIS — S0993XA Unspecified injury of face, initial encounter: Secondary | ICD-10-CM | POA: Diagnosis present

## 2021-02-26 MED ORDER — LIDOCAINE-EPINEPHRINE-TETRACAINE (LET) TOPICAL GEL
3.0000 mL | Freq: Once | TOPICAL | Status: AC
  Administered 2021-02-26: 3 mL via TOPICAL
  Filled 2021-02-26: qty 3

## 2021-02-26 NOTE — ED Triage Notes (Signed)
Pt via POV from home. Pt fell and hit his chin on a glass entertainment system. Pt has a small gash to the chin. Pt states it happened last night. Denies pain. Pt is A&Ox4 and NAD.  Last tetanus shot last year per mother.

## 2021-02-26 NOTE — ED Provider Notes (Signed)
[facility] EMERGENCY DEPARTMENT Provider Note   CSN: [id number] Arrival date & time: 02/26/21  1724     History Chief Complaint  Patient presents with   Laceration    Stephen Beard is a 25 y.o. male presents to the emergency department evaluation of laceration to the chin.  Patient states this morning around 7 AM he fell forward and hit his chin on a glass table.  He denies hitting his head or losing consciousness.  No headache, nausea or vomiting.  No neck pain.  No upper or lower extremity discomfort.  Patient states he has been falling recently ever since he had brain operation for tumor excision.  He denies any chest pain or shortness of breath.  Tetanus is up-to-date.  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[place]) 01/07/2020   Intentional overdose of drug in tablet form ([place]) 01/05/2020   Drug overdose, multiple drugs, intentional self-harm, initial encounter ([place]) 01/04/2020   Schizoaffective disorder, depressive type ([place]) 01/01/2020   Generalized anxiety disorder 01/01/2020   Severe episode of recurrent major depressive disorder, with psychotic features ([place]) 01/01/2020   Psychosis due to environmental factors as major part of etiology ([place])    Focal seizures ([place]) 11/07/2019   Auditory hallucinations 07/30/2019   Goals of care, counseling/discussion 07/19/2019   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   Anaplastic oligodendroglioma, 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[place]) 07/01/2019   Brain mass 06/30/2019   Attention deficit hyperactivity disorder (ADHD), combined type 03/23/2019   Mild episode of recurrent major depressive disorder ([place]) 03/23/2019   Tobacco abuse  10/04/2012   Allergic rhinitis 08/18/2012    Past Surgical History:  Procedure Laterality Date   APPLICATION OF CRANIAL NAVIGATION N/A 07/02/2019   Procedure: APPLICATION OF CRANIAL NAVIGATION;  Surgeon: Judith Part, MD;  Location: [facility];  Service: Neurosurgery;  Laterality: N/A;   CRANIOTOMY N/A 07/03/2019   Procedure: CRANIOTOMY HEMATOMA EVACUATION SUBDURAL;  Surgeon: Judith Part, MD;  Location: [facility];  Service: Neurosurgery;  Laterality: N/A;   CRANIOTOMY Right 07/03/2019   Procedure: CRANIOTOMY HEMATOMA EVACUATION SUBDURAL;  Surgeon: Judith Part, MD;  Location: [facility];  Service: Neurosurgery;  Laterality: Right;   CRANIOTOMY Right 07/02/2019   Procedure: CRANIOTOMY TUMOR EXCISION with Lucky Rathke;  Surgeon: Judith Part, MD;  Location: [facility];  Service: Neurosurgery;  Laterality: Right;  posterior       History reviewed. No pertinent family history.  Social History   Tobacco Use   Smoking status: Every Day    Packs/day: 1.00    Years: 12.00    Pack years: 12.00    Types: Cigarettes   Smokeless tobacco: Current    Types: Chew  Vaping Use   Vaping Use: Never used  Substance Use Topics   Alcohol use: Yes    Comment: last drink: "3 nights ago" when he had "1/4th of a bottle," has been drinking daily for several weeks from a "few beers to a bottle of liquor"   Drug use: Yes    Types: Cocaine    Comment: last use "couple of weeks ago" when he had "2 lines,"  frequency "once  in a blue moon"    Home Medications Prior to Admission medications   Medication Sig Start Date End Date Taking? Authorizing Provider  aluminum-magnesium hydroxide-simethicone (MAALOX) [phone] MG/5ML SUSP Take 30 mLs by mouth 4 (four) times daily -  before meals and at bedtime. 12/18/20   Carrie Mew, MD  busPIRone (BUSPAR) 10 MG tablet Take 1 tablet (10 mg total) by mouth 2 (two) times daily. 02/15/21   [name], MD  famotidine (PEPCID) 20 MG tablet Take 1 tablet (20  mg total) by mouth 2 (two) times daily. 12/18/20   Carrie Mew, MD  lamoTRIgine (LAMICTAL) 200 MG tablet Take 227m in AM, 2534min PM. 02/15/21   Vaslow, ZaAcey LavMD  lamoTRIgine (LAMICTAL) 25 MG tablet Take 2 tablets (50 mg total) by mouth 2 (two) times daily. 02/15/21   VaVentura SellersMD  metoCLOPramide (REGLAN) 10 MG tablet Take 1 tablet (10 mg total) by mouth every 6 (six) hours as needed. 12/18/20   StCarrie MewMD  OLANZapine (ZYPREXA) 20 MG tablet Take 1 tablet by mouth every evening. 10/27/20   [provider]  ondansetron (ZOFRAN) 8 MG tablet Take 1 tablet (8 mg total) by mouth 2 (two) times daily as needed (nausea and vomiting). May take 30-60 minutes prior to Temodar administration if nausea/vomiting occurs. 07/02/20   VaVentura SellersMD  sertraline (ZOLOFT) 50 MG tablet Take 1 tablet (50 mg total) by mouth daily. 01/12/21   VaVentura SellersMD  traZODone (DESYREL) 100 MG tablet Take 1 tablet (100 mg total) by mouth at bedtime as needed for sleep. 02/15/21   VaVentura SellersMD    Allergies    Patient has no known allergies.  Review of Systems   Review of Systems  Constitutional:  Negative for chills and fever.  Eyes:  Negative for photophobia and visual disturbance.  Gastrointestinal:  Negative for nausea and vomiting.  Musculoskeletal:  Negative for neck pain.  Skin:  Positive for wound.  Neurological:  Negative for dizziness, light-headedness and headaches.   Physical Exam Updated Vital Signs BP 120/89 (BP Location: Left Arm)    Pulse 96    Temp 98.3 F (36.8 C) (Oral)    Resp 20    Ht _0  (1.702 m)    Wt 68 kg    SpO2 99%    BMI 23.49 kg/m   Physical Exam Constitutional:      Appearance: He is well-developed.  HENT:     Head: Normocephalic.     Comments: 3 cm laceration to the chin.  Laceration linear with no visible palpable foreign body.  Bleeding well controlled. Eyes:     Conjunctiva/sclera: Conjunctivae normal.  Cardiovascular:      Rate and Rhythm: Normal rate.  Pulmonary:     Effort: Pulmonary effort is normal. No respiratory distress.  Musculoskeletal:        General: Normal range of motion.     Cervical back: Normal range of motion.     Comments: No cervical spinous process tenderness.  No paravertebral muscle tenderness.  Skin:    General: Skin is warm.     Findings: No rash.     Comments: 3 cm laceration to the chin  Neurological:     General: No focal deficit present.     Mental Status: He is alert and oriented to person, place, and time. Mental status is at baseline.     Cranial Nerves: No cranial nerve deficit.  Psychiatric:  Mood and Affect: Mood normal.        Behavior: Behavior normal.        Thought Content: Thought content normal.    ED Results / Procedures / Treatments   Labs (all labs ordered are listed, but only abnormal results are displayed) Labs Reviewed - No data to display  EKG None  Radiology No results found.  Procedures .Marland KitchenLaceration Repair  Date/Time: 02/26/2021 6:17 PM Performed by: Duanne Guess, PA-C Authorized by: Duanne Guess, PA-C   Consent:    Consent obtained:  Verbal   Consent given by:  Patient Universal protocol:    Patient identity confirmed:  Verbally with patient Anesthesia:    Anesthesia method:  Topical application   Topical anesthetic:  LET Laceration details:    Location:  Face   Face location:  Chin   Length (cm):  3   Depth (mm):  2 Exploration:    Hemostasis achieved with:  LET   Wound exploration: wound explored through full range of motion and entire depth of wound visualized     Contaminated: no   Treatment:    Area cleansed with:  Saline and povidone-iodine   Amount of cleaning:  Standard   Irrigation solution:  Sterile saline   Irrigation method:  Pressure wash Skin repair:    Repair method:  Sutures   Suture size:  6-0   Suture material:  Prolene   Suture technique:  Running and simple interrupted   Number of  sutures:  5 Repair type:    Repair type:  Simple Post-procedure details:    Dressing:  Antibiotic ointment   Procedure completion:  Tolerated well, no immediate complications   Medications Ordered in ED Medications  lidocaine-EPINEPHrine-tetracaine (LET) topical gel (has no administration in time range)    ED Course  I have reviewed the triage vital signs and the nursing notes.  Pertinent labs & imaging results that were available during my care of the patient were reviewed by me and considered in my medical decision making (see chart for details).    MDM Rules/Calculators/A&P                         25 year old male with laceration to the chin.  Laceration cleansed and repaired with number five 6-0 Prolene sutures.  Patient will follow-up in 6 days for suture removal.  No other injury to his body.  No head injury, headache, LOC, nausea vomiting or neck pain.  Final Clinical Impression(s) / ED Diagnoses Final diagnoses:  Chin laceration, initial encounter    Rx / DC Orders ED Discharge Orders     None        Renata Caprice 02/26/21 1840    Vanessa , MD 02/27/21 1103

## 2021-02-26 NOTE — ED Notes (Signed)
Rn to bedside. Pt CAOx4 and in no distress.

## 2021-02-26 NOTE — Discharge Instructions (Addendum)
You may shower and get laceration site wet.  Do not submerge underwater.  Follow-up with primary care provider walk-in clinic or ER in 6 days for suture removal and Steri-Strip application.

## 2021-03-19 ENCOUNTER — Ambulatory Visit: Payer: Medicaid Other | Admitting: Internal Medicine

## 2021-04-01 ENCOUNTER — Telehealth: Payer: Self-pay | Admitting: *Deleted

## 2021-04-01 ENCOUNTER — Other Ambulatory Visit: Payer: Self-pay | Admitting: Internal Medicine

## 2021-04-01 DIAGNOSIS — F411 Generalized anxiety disorder: Secondary | ICD-10-CM

## 2021-04-01 NOTE — Telephone Encounter (Signed)
Patient called requesting refill of Lamotrigine 25 mg.  States he has missed placed the bottle.  He confirmed he has the Lamotrigine 200 mg tablets.  He just picked them up from pharmacy a little over a week ago and they will not refill.  Requests a New Rx for the remaining tablets for the Lamotrigine 25 mg.  Routing to MD

## 2021-04-02 ENCOUNTER — Encounter: Payer: Self-pay | Admitting: Internal Medicine

## 2021-04-02 ENCOUNTER — Other Ambulatory Visit: Payer: Self-pay | Admitting: Internal Medicine

## 2021-04-02 DIAGNOSIS — F251 Schizoaffective disorder, depressive type: Secondary | ICD-10-CM

## 2021-04-02 MED ORDER — LAMOTRIGINE 25 MG PO TABS: 50.0000 mg | ORAL_TABLET | Freq: Two times a day (BID) | ORAL | 3 refills | Status: DC

## 2021-04-16 ENCOUNTER — Other Ambulatory Visit (HOSPITAL_COMMUNITY): Payer: Self-pay

## 2021-04-19 ENCOUNTER — Other Ambulatory Visit: Payer: Self-pay | Admitting: Radiation Therapy

## 2021-05-11 ENCOUNTER — Other Ambulatory Visit: Payer: Self-pay | Admitting: *Deleted

## 2021-05-11 ENCOUNTER — Encounter: Payer: Self-pay | Admitting: Internal Medicine

## 2021-05-11 DIAGNOSIS — F251 Schizoaffective disorder, depressive type: Secondary | ICD-10-CM

## 2021-05-11 NOTE — Progress Notes (Addendum)
Patient called to request new order for outpatient behavioral health referral since he moved back to [place] from [place].  Called referral to [facility] [place].  Patient was previously established with [facility] [phone].  Order placed.  They can not accept him currently with his insurance being the way it is.  Made patient aware and he will go to get it corrected and he will call them once that is done to schedule.

## 2021-05-14 ENCOUNTER — Ambulatory Visit (HOSPITAL_COMMUNITY)
Admission: RE | Admit: 2021-05-14 | Discharge: 2021-05-14 | Disposition: A | Discharge status: Home / Self Care | Payer: Medicaid Other | Source: Ambulatory Visit | Attending: Internal Medicine | Admitting: Internal Medicine

## 2021-05-14 ENCOUNTER — Ambulatory Visit: Payer: Medicaid Other | Admitting: Internal Medicine

## 2021-05-14 ENCOUNTER — Other Ambulatory Visit: Payer: Self-pay

## 2021-05-14 DIAGNOSIS — C714 Malignant neoplasm of occipital lobe: Secondary | ICD-10-CM

## 2021-05-14 IMAGING — MR MR HEAD WO/W CM
15 series · 48 of 48 positions shown · IV contrast (gadavist)
Comparison: Brain MRI [DATE] and earlier.

CLINICAL DATA: 25-year-old male with right occipital anaplastic
oligodendroglioma status post resection in [DATE], subsequent
IMRT. T was only mild. Restaging.

EXAM:
MRI HEAD WITHOUT AND WITH CONTRAST
TECHNIQUE: Multiplanar, multiecho pulse sequences of the brain and surrounding
structures were obtained without and with intravenous contrast.
CONTRAST:  7mL GADAVIST GADOBUTROL 1 MMOL/ML IV SOLN

[Series 5: DWI · axial · 3.0mm · 1.36mm/px · z∈[-23,+124]mm · 5 of 100 slices shown (1 of 2)]
[im 1/100]
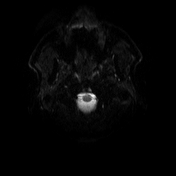
[im 25/100]
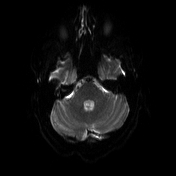
[im 50/100]
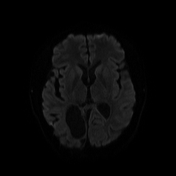
[im 75/100]
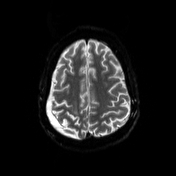
[im 100/100]
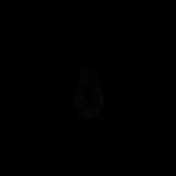

[Series 6: DWI · axial · 3.0mm · 1.36mm/px · z∈[-23,+124]mm · 3 of 50 slices shown (2 of 2)]
[im 1/50]
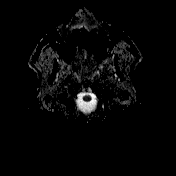
[im 25/50]
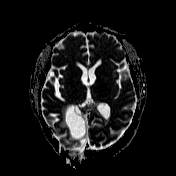
[im 50/50]
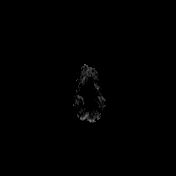

[Series 7: T1 · sagittal · 5.0mm · 0.75mm/px · 1 of 24 slices shown (1 of 4)]
[im 1/24]
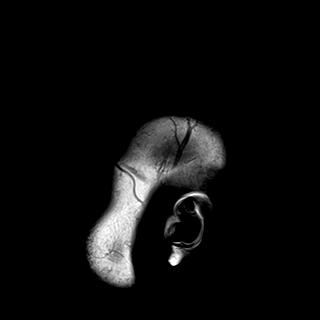

[Series 8: T2 · axial · 5.0mm · 0.62mm/px · 1 of 26 slices shown]
[im 1/26]
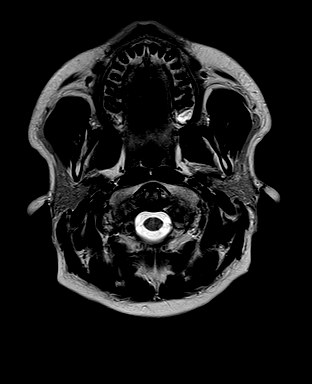

[Series 9: swi_images · axial · 3.0mm · 0.75mm/px · z∈[-37,+139]mm · 3 of 60 slices shown]
[im 1/60]
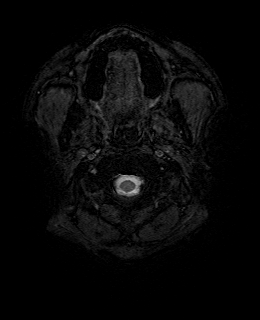
[im 30/60]
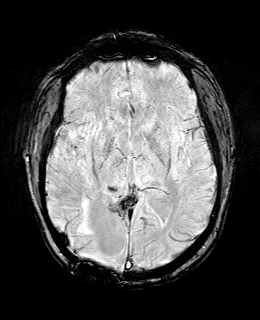
[im 60/60]
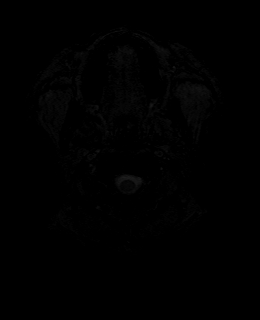

[Series 11: FLAIR · axial · 3.0mm · 0.75mm/px · z∈[-28,+130]mm · 3 of 54 slices shown]
[im 1/54]
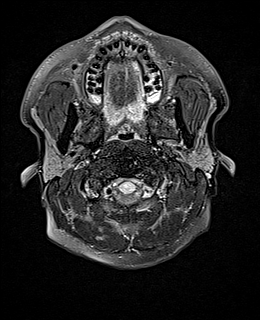
[im 27/54]
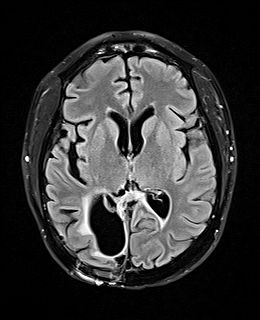
[im 54/54]
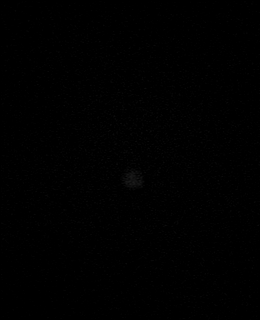

[Series 12: T1 · axial · 1.0mm · 0.94mm/px · z∈[-28,+130]mm · 9 of 160 slices shown (2 of 4)]
[im 1/160]
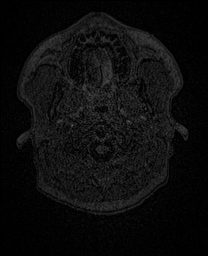
[im 20/160]
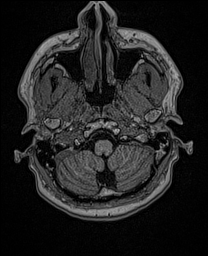
[im 40/160]
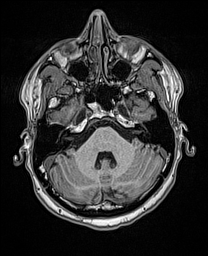
[im 60/160]
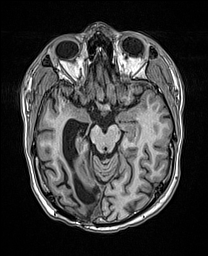
[im 80/160]
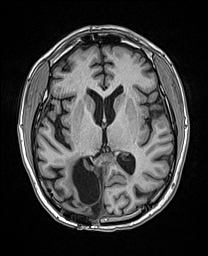
[im 100/160]
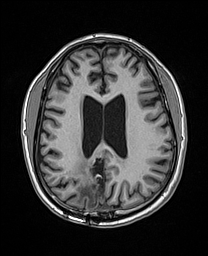
[im 120/160]
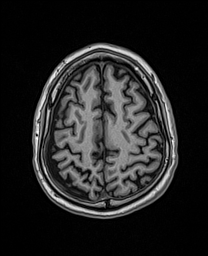
[im 140/160]
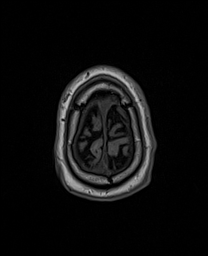
[im 160/160]
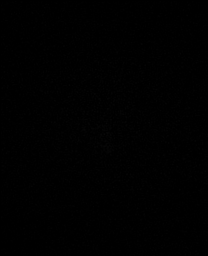

[Series 13: cor dwi_tracew · coronal · 5.0mm · 1.53mm/px · 3 of 60 slices shown]
[im 1/60]
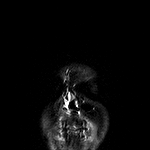
[im 30/60]
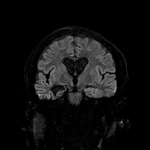
[im 60/60]
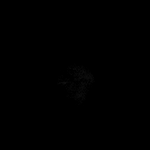

[Series 14: cor dwi_adc · coronal · 5.0mm · 1.53mm/px · 2 of 30 slices shown]
[im 1/30]
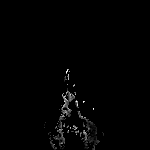
[im 30/30]
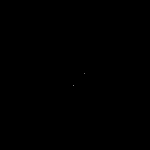

[Series 19: T2 post-contrast · coronal · 5.0mm · 0.57mm/px · 2 of 30 slices shown]
[im 1/30]
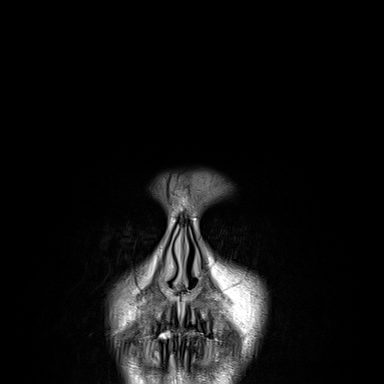
[im 30/30]
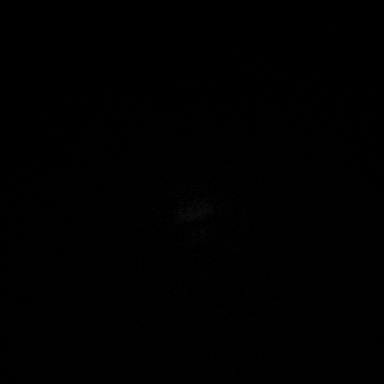

[Series 20: T1 post-contrast · axial · 1.0mm · 0.94mm/px · z∈[-23,+136]mm · 9 of 160 slices shown (1 of 3)]
[im 1/160]
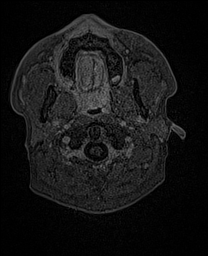
[im 20/160]
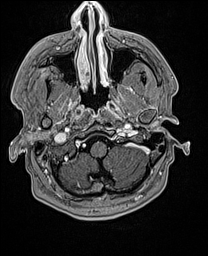
[im 40/160]
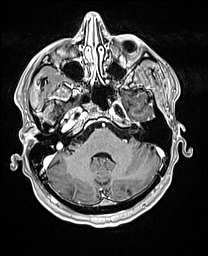
[im 60/160]
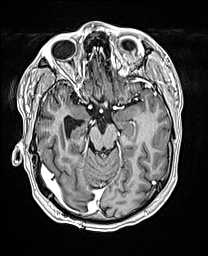
[im 80/160]
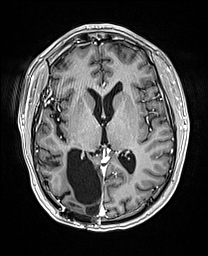
[im 100/160]
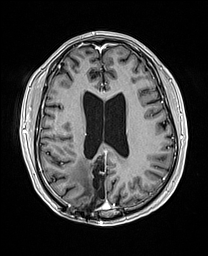
[im 120/160]
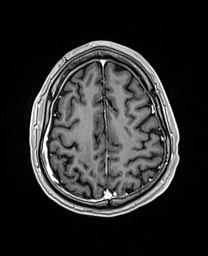
[im 140/160]
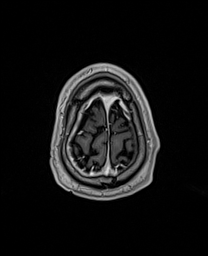
[im 160/160]
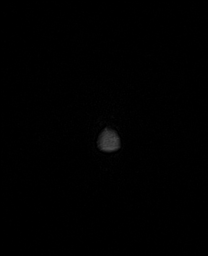

[Series 21: T1 · sagittal · 4.0mm · 0.94mm/px · 2 of 35 slices shown (3 of 4)]
[im 1/35]
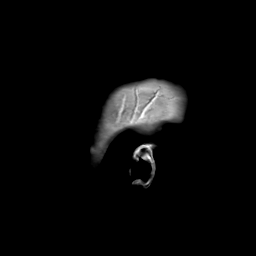
[im 35/35]
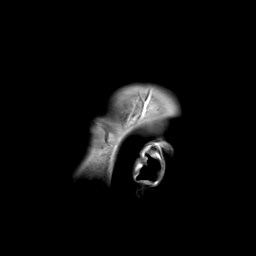

[Series 22: T1 · coronal · 4.0mm · 0.94mm/px · 2 of 46 slices shown (4 of 4)]
[im 1/46]
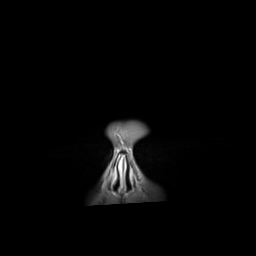
[im 46/46]
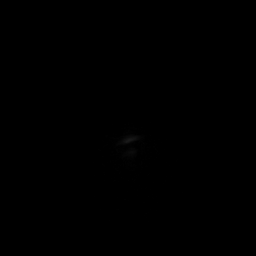

[Series 23: T1 post-contrast · coronal · 5.0mm · 0.43mm/px · 2 of 30 slices shown (2 of 3)]
[im 1/30]
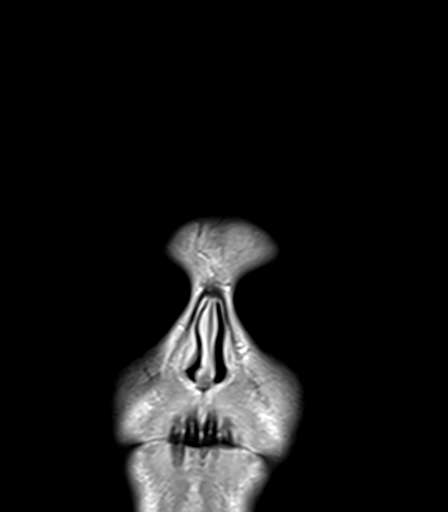
[im 30/30]
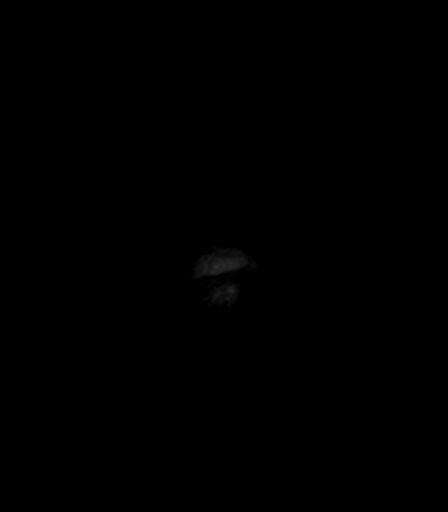

[Series 24: T1 post-contrast · sagittal · 5.0mm · 0.75mm/px · 1 of 24 slices shown (3 of 3)]
[im 1/24]
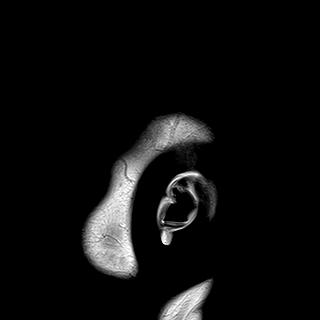

[48 of 48 positions shown; findings below may reference images not displayed]

FINDINGS: Brain: Chronic right posterior hemisphere resection at the level of
the parieto-occipital sulcus. Regional cystic encephalomalacia,
confluent white matter T2 and FLAIR hyperintensity which is stable
since [DATE]. Regional hemosiderin is stable.

Enlarged right temporal and occipital horns with fairly smooth but
confluent subependymal enhancement (series 20, image 91 and series
24, image 10), partially inseparable from the nearby right choroid
plexus (series 20, image 88). Much of this is new since [DATE]
and has progressed since [DATE]. Compare series 20, image 91 to
series 16, image [REDACTED]). But no suspicious diffusion
changes at that site. Additional smooth posterior occipital horn
dural thickening or enhancement redemonstrated (series 20, image 75)
and more convincingly postoperative in nature. No mass effect.

Similarly, stable right superior convexity dural thickening and
enhancement since FIRCHIC[facility]. No other new intracranial enhancement.

No superimposed restricted diffusion to suggest acute infarction.
Stable ventricle size and configuration. No midline shift or acute
intracranial hemorrhage. Cervicomedullary junction and pituitary are
within normal limits. No new signal abnormality on T2/FLAIR.

Vascular: Major intracranial vascular flow voids are stable.
Dominant right vertebral artery. The major dural venous sinuses are
enhancing and appear to be patent.

Skull and upper cervical spine: Stable craniotomy. Otherwise
negative. Negative visible cervical spine.

Sinuses/Orbits: Stable, negative.

Other: Mastoids are clear. Visible internal auditory structures
appear normal.
IMPRESSION: 1. Small focus of increased enhancement along the anterior superior
resection cavity since last year (series 20, image 91), although
partially contiguous with the choroid plexus. Appearance is
indeterminate at this time with otherwise unchanged MRI appearance
of the treatment area. Recommend continued MRI surveillance.

2. No new intracranial abnormality.

## 2021-05-14 MED ORDER — GADOBUTROL 1 MMOL/ML IV SOLN
7.0000 mL | Freq: Once | INTRAVENOUS | Status: AC | PRN
  Administered 2021-05-14: 7 mL via INTRAVENOUS

## 2021-05-17 ENCOUNTER — Inpatient Hospital Stay: Payer: Medicaid Other

## 2021-05-17 ENCOUNTER — Other Ambulatory Visit: Payer: Self-pay

## 2021-05-17 ENCOUNTER — Inpatient Hospital Stay: Payer: Medicaid Other | Attending: Internal Medicine | Admitting: Internal Medicine

## 2021-05-17 VITALS — BP 119/80 | HR 64 | Resp 18 | Wt 137.6 lb

## 2021-05-17 DIAGNOSIS — C714 Malignant neoplasm of occipital lobe: Secondary | ICD-10-CM

## 2021-05-17 DIAGNOSIS — R569 Unspecified convulsions: Secondary | ICD-10-CM | POA: Diagnosis not present

## 2021-05-17 LAB — CBC WITH DIFFERENTIAL (CANCER CENTER ONLY)
Abs Immature Granulocytes: 0.01 10*3/uL (ref 0.00–0.07)
Basophils Absolute: 0 10*3/uL (ref 0.0–0.1)
Basophils Relative: 1 %
Eosinophils Absolute: 0 10*3/uL (ref 0.0–0.5)
Eosinophils Relative: 1 %
HCT: 46.1 % (ref 39.0–52.0)
Hemoglobin: 17 g/dL (ref 13.0–17.0)
Immature Granulocytes: 0 %
Lymphocytes Relative: 31 %
Lymphs Abs: 1.2 10*3/uL (ref 0.7–4.0)
MCH: 34.9 pg — ABNORMAL HIGH (ref 26.0–34.0)
MCHC: 36.9 g/dL — ABNORMAL HIGH (ref 30.0–36.0)
MCV: 94.7 fL (ref 80.0–100.0)
Monocytes Absolute: 0.4 10*3/uL (ref 0.1–1.0)
Monocytes Relative: 10 %
Neutro Abs: 2.2 10*3/uL (ref 1.7–7.7)
Neutrophils Relative %: 57 %
Platelet Count: 160 10*3/uL (ref 150–400)
RBC: 4.87 MIL/uL (ref 4.22–5.81)
RDW: 11.4 % — ABNORMAL LOW (ref 11.5–15.5)
WBC Count: 3.8 10*3/uL — ABNORMAL LOW (ref 4.0–10.5)
nRBC: 0 % (ref 0.0–0.2)

## 2021-05-17 LAB — CMP (CANCER CENTER ONLY)
ALT: 48 U/L — ABNORMAL HIGH (ref 0–44)
AST: 37 U/L (ref 15–41)
Albumin: 4.7 g/dL (ref 3.5–5.0)
Alkaline Phosphatase: 65 U/L (ref 38–126)
Anion gap: 8 (ref 5–15)
BUN: 18 mg/dL (ref 6–20)
CO2: 26 mmol/L (ref 22–32)
Calcium: 9.8 mg/dL (ref 8.9–10.3)
Chloride: 101 mmol/L (ref 98–111)
Creatinine: 0.95 mg/dL (ref 0.61–1.24)
GFR, Estimated: >60 mL/min (ref >60)
Glucose, Bld: 98 mg/dL (ref 70–99)
Potassium: 3.9 mmol/L (ref 3.5–5.1)
Sodium: 135 mmol/L (ref 135–145)
Total Bilirubin: 1.1 mg/dL (ref 0.3–1.2)
Total Protein: 7.3 g/dL (ref 6.5–8.1)

## 2021-05-17 NOTE — Progress Notes (Signed)
[facility], [place] [address] [phone]   Interval Evaluation  Date of Service: 05/17/21 Patient Name: Stephen Beard Patient MRN: [id number] Patient DOB: 07/03/1995 Provider: [name], MD  Identifying Statement:  Stephen Beard is a 26 y.o. male with right parietal  anaplastic oligodendoglioma    Oncologic History: Oncology History  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[place])  07/02/2019 Surgery   Craniotomy, resection by Dr. Zada Finders. Followed by hematoma evacuation on 07/03/19.   08/05/2019 - 09/17/2019 Radiation Therapy   IMRT with concurrent Temodar 54m/m2 daily   10/15/2019 -  Chemotherapy    Patient is on Treatment Plan: BRAIN ANAPLASTIC GLIOMA GRADE III TEMOZOLOMIDE POST XRT Q28D         Biomarkers:  MGMT Unknown.  IDH 1/2 Mutated.  EGFR Unknown  1p/19q co-deleted   Interval History:  Stephen Beard presents today for follow up after recent MRI brain.  No frank seizures, Lamictal compliance has been good.  Continues to have mild auras once per week or so.  Denies new or progressive deficits.  Occassional tension headaches only.  Currently "in between" psychiatry providers, [place]  H+P (07/19/19) Patient presented to medical attention with several months of progressive headaches and left sided visual impairment.  This was accompanied by episodes of deja-vu +paroxysmal burning smells which would occur almost daily over that time.  CNS imaging demonstrated large parieto-occipital mass with herniation syndrome.  Mass was resected/debulked, and post-operative course was complicated by intracranial hematoma and also respiratory failure.  At present, he is walking and talking, carries left sided visual impairment.  Currently on antibiotics for wound infection per Dr. OZada Finders  Not requiring steroids.  Currently on lamictal 534m daily.  Medications: Current Outpatient Medications on File Prior to Visit  Medication Sig Dispense Refill   aluminum-magnesium hydroxide-simethicone (MAALOX) 200-200-20 MG/5ML SUSP Take 30 mLs by mouth 4 (four) times daily -  before meals and at bedtime. 355 mL 0   busPIRone (BUSPAR) 10 MG tablet Take 1 tablet (10 mg total) by mouth 2 (two) times daily. 60 tablet 3   famotidine (PEPCID) 20 MG tablet Take 1 tablet (20 mg total) by mouth 2 (two) times daily. 60 tablet 0   lamoTRIgine (LAMICTAL) 200 MG tablet Take 25073mn AM, 250m19m PM. 60 tablet 2   lamoTRIgine (LAMICTAL) 25 MG tablet Take 2 tablets (50 mg total) by mouth 2 (two) times daily. 120 tablet 3   metoCLOPramide (REGLAN) 10 MG tablet Take 1 tablet (10 mg total) by mouth every 6 (six) hours as needed. 30 tablet 0   OLANZapine (ZYPREXA) 20 MG tablet Take 1 tablet by mouth every evening.     ondansetron (ZOFRAN) 8 MG tablet Take 1 tablet (8 mg total) by mouth 2 (two) times daily as needed (nausea and vomiting). May take 30-60 minutes prior to Temodar administration if nausea/vomiting occurs. 30 tablet 1   sertraline (ZOLOFT) 50 MG tablet Take 1 tablet by mouth once daily 30 tablet 0   traZODone (DESYREL) 100 MG tablet Take 1 tablet (100 mg total) by mouth at bedtime as needed for sleep. 30 tablet 3   No current facility-administered medications on file prior to visit.    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[place]  Headache    Psychotic disorder (HCC)    Seizures ([place])    Past Surgical History:  Past Surgical History:  Procedure Laterality Date   APPLICATION OF CRANIAL NAVIGATION N/A 07/02/2019   Procedure: APPLICATION OF CRANIAL NAVIGATION;  Surgeon: Judith Part, MD;  Location: [facility];  Service: Neurosurgery;  Laterality: N/A;   CRANIOTOMY N/A 07/03/2019   Procedure: CRANIOTOMY HEMATOMA  EVACUATION SUBDURAL;  Surgeon: Judith Part, MD;  Location: [facility];  Service: Neurosurgery;  Laterality: N/A;   CRANIOTOMY Right 07/03/2019   Procedure: CRANIOTOMY HEMATOMA EVACUATION SUBDURAL;  Surgeon: Judith Part, MD;  Location: [facility];  Service: Neurosurgery;  Laterality: Right;   CRANIOTOMY Right 07/02/2019   Procedure: CRANIOTOMY TUMOR EXCISION with Lucky Rathke;  Surgeon: Judith Part, MD;  Location: [facility];  Service: Neurosurgery;  Laterality: Right;  posterior   Social History:  Social History   Socioeconomic History   Marital status: Single    Spouse name: Not on file   Number of children: Not on file   Years of education: Not on file   Highest education level: Not on file  Occupational History   Not on file  Tobacco Use   Smoking status: Every Day    Packs/day: 1.00    Years: 12.00    Pack years: 12.00    Types: Cigarettes   Smokeless tobacco: Current    Types: Chew  Vaping Use   Vaping Use: Never used  Substance and Sexual Activity   Alcohol use: Yes    Comment: last drink: "3 nights ago" when he had "1/4th of a bottle," has been drinking daily for several weeks from a "few beers to a bottle of liquor"   Drug use: Yes    Types: Cocaine    Comment: last use "couple of weeks ago" when he had "2 lines,"  frequency "once in a blue moon"   Sexual activity: Not Currently  Other Topics Concern   Not on file  Social History Narrative   Not on file   Social Determinants of Health   Financial Resource Strain: Not on file  Food Insecurity: Not on file  Transportation Needs: Not on file  Physical Activity: Not on file  Stress: Not on file  Social Connections: Not on file  Intimate Partner Violence: Not on file   Family History: No family history on file.  Review of Systems: Constitutional: Doesn't report fevers, chills or abnormal weight loss Eyes: Doesn't report blurriness of vision Ears, nose, mouth, throat, and face: Doesn't report sore  throat Respiratory: Doesn't report cough, dyspnea or wheezes Cardiovascular: Doesn't report palpitation, chest discomfort  Gastrointestinal:  Doesn't report nausea, constipation, diarrhea GU: Doesn't report incontinence Skin: Doesn't report skin rashes Neurological: Per HPI Musculoskeletal: Doesn't report joint pain Behavioral/Psych: Doesn't report anxiety  Physical Exam: Vitals:   05/17/21 1151  BP: 119/80  Pulse: 64  Resp: 18  SpO2: 99%   KPS: 80. General: Alert, cooperative, pleasant, in no acute distress Head: Normal EENT: No conjunctival injection or scleral icterus.  Lungs: Resp effort normal Cardiac: Regular rate Abdomen: Non-distended abdomen Skin: No rashes cyanosis or petechiae. Extremities: No clubbing or edema  Neurologic Exam: Mental Status: Awake, alert, attentive to examiner. Oriented to self and environment. Language is fluent with intact comprehension.  Cranial Nerves: Visual acuity is grossly normal. Left homonymous hemianopia. Extra-ocular movements intact. No ptosis. Face is symmetric Motor: Tone and bulk are normal. Power is full in both arms and legs. Reflexes are symmetric, no  pathologic reflexes present.  Sensory: Intact to light touch Gait: Sensory dystaxia   Labs: I have reviewed the data as listed    Component Value Date/Time   NA 135 05/17/2021 1021   K 3.9 05/17/2021 1021   CL 101 05/17/2021 1021   CO2 26 05/17/2021 1021   GLUCOSE 98 05/17/2021 1021   BUN 18 05/17/2021 1021   CREATININE 0.95 05/17/2021 1021   CALCIUM 9.8 05/17/2021 1021   PROT 7.3 05/17/2021 1021   ALBUMIN 4.7 05/17/2021 1021   AST 37 05/17/2021 1021   ALT 48 (H) 05/17/2021 1021   ALKPHOS 65 05/17/2021 1021   BILITOT 1.1 05/17/2021 1021   GFRNONAA >60 05/17/2021 1021   GFRAA >60 12/05/2019 1206   Lab Results  Component Value Date   WBC 3.8 (L) 05/17/2021   NEUTROABS 2.2 05/17/2021   HGB 17.0 05/17/2021   HCT 46.1 05/17/2021   MCV 94.7 05/17/2021   PLT 160  05/17/2021   Imaging:  [facility] Clinician Interpretation: I have personally reviewed the CNS images as listed.  My interpretation, in the context of the patient's clinical presentation, is treatment effect vs true progression  MR BRAIN W WO CONTRAST  Result Date: 05/15/2021 CLINICAL DATA:  26 year old male with right occipital anaplastic oligodendroglioma status post resection in April 2021, subsequent IMRT. T was only mild. Restaging. EXAM: MRI HEAD WITHOUT AND WITH CONTRAST TECHNIQUE: Multiplanar, multiecho pulse sequences of the brain and surrounding structures were obtained without and with intravenous contrast. CONTRAST:  28m GADAVIST GADOBUTROL 1 MMOL/ML IV SOLN COMPARISON:  Brain MRI 02/05/2021 and earlier. FINDINGS: Brain: Chronic right posterior hemisphere resection at the level of the parieto-occipital sulcus. Regional cystic encephalomalacia, confluent white matter T2 and FLAIR hyperintensity which is stable since April 2022. Regional hemosiderin is stable. Enlarged right temporal and occipital horns with fairly smooth but confluent subependymal enhancement (series 20, image 91 and series 24, image 10), partially inseparable from the nearby right choroid plexus (series 20, image 88). Much of this is new since 06/27/2020 and has progressed since 02/05/2021. Compare series 20, image 91 to series 16, image 87 in November). But no suspicious diffusion changes at that site. Additional smooth posterior occipital horn dural thickening or enhancement redemonstrated (series 20, image 75) and more convincingly postoperative in nature. No mass effect. Similarly, stable right superior convexity dural thickening and enhancement since last April. No other new intracranial enhancement. No superimposed restricted diffusion to suggest acute infarction. Stable ventricle size and configuration. No midline shift or acute intracranial hemorrhage. Cervicomedullary junction and pituitary are within normal limits. No new  signal abnormality on T2/FLAIR. Vascular: Major intracranial vascular flow voids are stable. Dominant right vertebral artery. The major dural venous sinuses are enhancing and appear to be patent. Skull and upper cervical spine: Stable craniotomy. Otherwise negative. Negative visible cervical spine. Sinuses/Orbits: Stable, negative. Other: Mastoids are clear. Visible internal auditory structures appear normal. IMPRESSION: 1. Small focus of increased enhancement along the anterior superior resection cavity since last year (series 20, image 91), although partially contiguous with the choroid plexus. Appearance is indeterminate at this time with otherwise unchanged MRI appearance of the treatment area. Recommend continued MRI surveillance. 2. No new intracranial abnormality. Electronically Signed   By: HGenevie AnnM.D.   On: 05/15/2021 10:04    Assessment/Plan 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[facility] [C71.4]  Aydon L Beard is clinically stable today.  MRI brain again demonstrates focus of linear enhancement along ependymal surface of the resection cavity, this time on the  superior aspect.  Etiology remains suspected delayed radiation treatment effect.  Pattern is less c/w tumor progression, though this must be considered as well.  We again recommended continued imaging surveillance only at this time, he is agreeable with this plan.   For breakthrough seizures, we recommended continuing Lamictal 250/250.  We ask that Stephen Beard return to clinic in 3 months following next brain MRI, or sooner as needed.    All questions were answered. The patient knows to call the clinic with any problems, questions or concerns. No barriers to learning were detected.  I have spent a total of 40 minutes of face-to-face and non-face-to-face time, excluding clinical staff time, preparing to see patient, ordering tests and/or medications, counseling the patient, and independently interpreting results and communicating  results to the patient/family/caregiver    [name], MD Medical Director of Neuro-Oncology [facility] at [place] 05/17/21 12:06 PM

## 2021-05-18 ENCOUNTER — Telehealth: Payer: Self-pay | Admitting: Internal Medicine

## 2021-05-18 NOTE — Telephone Encounter (Signed)
Scheduled per 3/6 los, pt has been called and confirmed ?

## 2021-05-19 ENCOUNTER — Other Ambulatory Visit: Payer: Self-pay | Admitting: Radiation Therapy

## 2021-05-22 ENCOUNTER — Other Ambulatory Visit: Payer: Self-pay

## 2021-05-22 ENCOUNTER — Inpatient Hospital Stay (HOSPITAL_COMMUNITY): Payer: Medicaid Other

## 2021-05-22 ENCOUNTER — Encounter (HOSPITAL_COMMUNITY): Payer: Self-pay | Admitting: Pulmonary Disease

## 2021-05-22 ENCOUNTER — Inpatient Hospital Stay (HOSPITAL_COMMUNITY)
Admission: EM | Admit: 2021-05-22 | Discharge: 2021-05-27 | DRG: 100 | Disposition: A | Discharge status: Home / Self Care | Payer: Medicaid Other | Attending: Internal Medicine | Admitting: Internal Medicine

## 2021-05-22 ENCOUNTER — Emergency Department (HOSPITAL_COMMUNITY): Payer: Medicaid Other

## 2021-05-22 DIAGNOSIS — E861 Hypovolemia: Secondary | ICD-10-CM | POA: Diagnosis present

## 2021-05-22 DIAGNOSIS — F32A Depression, unspecified: Secondary | ICD-10-CM | POA: Diagnosis present

## 2021-05-22 DIAGNOSIS — D496 Neoplasm of unspecified behavior of brain: Secondary | ICD-10-CM | POA: Diagnosis not present

## 2021-05-22 DIAGNOSIS — E872 Acidosis, unspecified: Secondary | ICD-10-CM | POA: Diagnosis present

## 2021-05-22 DIAGNOSIS — E876 Hypokalemia: Secondary | ICD-10-CM | POA: Diagnosis present

## 2021-05-22 DIAGNOSIS — Z9114 Patient's other noncompliance with medication regimen: Secondary | ICD-10-CM

## 2021-05-22 DIAGNOSIS — J9601 Acute respiratory failure with hypoxia: Secondary | ICD-10-CM | POA: Diagnosis present

## 2021-05-22 DIAGNOSIS — G40901 Epilepsy, unspecified, not intractable, with status epilepticus: Principal | ICD-10-CM | POA: Diagnosis present

## 2021-05-22 DIAGNOSIS — F909 Attention-deficit hyperactivity disorder, unspecified type: Secondary | ICD-10-CM | POA: Diagnosis present

## 2021-05-22 DIAGNOSIS — T50905A Adverse effect of unspecified drugs, medicaments and biological substances, initial encounter: Secondary | ICD-10-CM | POA: Diagnosis present

## 2021-05-22 DIAGNOSIS — F259 Schizoaffective disorder, unspecified: Secondary | ICD-10-CM | POA: Diagnosis present

## 2021-05-22 DIAGNOSIS — F419 Anxiety disorder, unspecified: Secondary | ICD-10-CM | POA: Diagnosis present

## 2021-05-22 DIAGNOSIS — F17210 Nicotine dependence, cigarettes, uncomplicated: Secondary | ICD-10-CM | POA: Diagnosis present

## 2021-05-22 DIAGNOSIS — J988 Other specified respiratory disorders: Secondary | ICD-10-CM | POA: Diagnosis not present

## 2021-05-22 DIAGNOSIS — G928 Other toxic encephalopathy: Secondary | ICD-10-CM | POA: Diagnosis present

## 2021-05-22 DIAGNOSIS — F1510 Other stimulant abuse, uncomplicated: Secondary | ICD-10-CM | POA: Diagnosis present

## 2021-05-22 DIAGNOSIS — J690 Pneumonitis due to inhalation of food and vomit: Secondary | ICD-10-CM | POA: Diagnosis not present

## 2021-05-22 DIAGNOSIS — E860 Dehydration: Secondary | ICD-10-CM | POA: Diagnosis present

## 2021-05-22 DIAGNOSIS — I959 Hypotension, unspecified: Secondary | ICD-10-CM | POA: Diagnosis present

## 2021-05-22 DIAGNOSIS — Z79899 Other long term (current) drug therapy: Secondary | ICD-10-CM

## 2021-05-22 DIAGNOSIS — T17908A Unspecified foreign body in respiratory tract, part unspecified causing other injury, initial encounter: Secondary | ICD-10-CM

## 2021-05-22 DIAGNOSIS — D6959 Other secondary thrombocytopenia: Secondary | ICD-10-CM | POA: Diagnosis present

## 2021-05-22 DIAGNOSIS — Z9221 Personal history of antineoplastic chemotherapy: Secondary | ICD-10-CM | POA: Diagnosis not present

## 2021-05-22 DIAGNOSIS — F1910 Other psychoactive substance abuse, uncomplicated: Secondary | ICD-10-CM | POA: Diagnosis not present

## 2021-05-22 DIAGNOSIS — R000 Tachycardia, unspecified: Secondary | ICD-10-CM | POA: Diagnosis present

## 2021-05-22 DIAGNOSIS — Z85841 Personal history of malignant neoplasm of brain: Secondary | ICD-10-CM

## 2021-05-22 DIAGNOSIS — Z20822 Contact with and (suspected) exposure to covid-19: Secondary | ICD-10-CM | POA: Diagnosis present

## 2021-05-22 DIAGNOSIS — G40401 Other generalized epilepsy and epileptic syndromes, not intractable, with status epilepticus: Secondary | ICD-10-CM | POA: Diagnosis present

## 2021-05-22 DIAGNOSIS — N179 Acute kidney failure, unspecified: Secondary | ICD-10-CM | POA: Diagnosis present

## 2021-05-22 DIAGNOSIS — J9600 Acute respiratory failure, unspecified whether with hypoxia or hypercapnia: Secondary | ICD-10-CM

## 2021-05-22 DIAGNOSIS — J9690 Respiratory failure, unspecified, unspecified whether with hypoxia or hypercapnia: Secondary | ICD-10-CM

## 2021-05-22 LAB — I-STAT CHEM 8, ED
BUN: 16 mg/dL (ref 6–20)
Calcium, Ion: 1.03 mmol/L — ABNORMAL LOW (ref 1.15–1.40)
Chloride: 106 mmol/L (ref 98–111)
Creatinine, Ser: 1.1 mg/dL (ref 0.61–1.24)
Glucose, Bld: 112 mg/dL — ABNORMAL HIGH (ref 70–99)
HCT: 55 % — ABNORMAL HIGH (ref 39.0–52.0)
Hemoglobin: 18.7 g/dL — ABNORMAL HIGH (ref 13.0–17.0)
Potassium: 4.5 mmol/L (ref 3.5–5.1)
Sodium: 135 mmol/L (ref 135–145)
TCO2: 9 mmol/L — ABNORMAL LOW (ref 22–32)

## 2021-05-22 LAB — I-STAT ARTERIAL BLOOD GAS, ED
Acid-base deficit: 13 mmol/L — ABNORMAL HIGH (ref 0.0–2.0)
Bicarbonate: 13.8 mmol/L — ABNORMAL LOW (ref 20.0–28.0)
Calcium, Ion: 1.14 mmol/L — ABNORMAL LOW (ref 1.15–1.40)
HCT: 49 % (ref 39.0–52.0)
Hemoglobin: 16.7 g/dL (ref 13.0–17.0)
O2 Saturation: 100 %
Potassium: 4.4 mmol/L (ref 3.5–5.1)
Sodium: 133 mmol/L — ABNORMAL LOW (ref 135–145)
TCO2: 15 mmol/L — ABNORMAL LOW (ref 22–32)
pCO2 arterial: 33.7 mmHg (ref 32–48)
pH, Arterial: 7.219 — ABNORMAL LOW (ref 7.35–7.45)
pO2, Arterial: 352 mmHg — ABNORMAL HIGH (ref 83–108)

## 2021-05-22 LAB — HEPATIC FUNCTION PANEL
ALT: 69 U/L — ABNORMAL HIGH (ref 0–44)
AST: 94 U/L — ABNORMAL HIGH (ref 15–41)
Albumin: 4.6 g/dL (ref 3.5–5.0)
Alkaline Phosphatase: 75 U/L (ref 38–126)
Bilirubin, Direct: 0.3 mg/dL — ABNORMAL HIGH (ref 0.0–0.2)
Indirect Bilirubin: 1.1 mg/dL — ABNORMAL HIGH (ref 0.3–0.9)
Total Bilirubin: 1.4 mg/dL — ABNORMAL HIGH (ref 0.3–1.2)
Total Protein: 7.5 g/dL (ref 6.5–8.1)

## 2021-05-22 LAB — BASIC METABOLIC PANEL
BUN: 15 mg/dL (ref 6–20)
CO2: <7 mmol/L — ABNORMAL LOW (ref 22–32)
Calcium: 9.5 mg/dL (ref 8.9–10.3)
Chloride: 98 mmol/L (ref 98–111)
Creatinine, Ser: 1.42 mg/dL — ABNORMAL HIGH (ref 0.61–1.24)
GFR, Estimated: >60 mL/min (ref >60)
Glucose, Bld: 116 mg/dL — ABNORMAL HIGH (ref 70–99)
Potassium: 4.4 mmol/L (ref 3.5–5.1)
Sodium: 136 mmol/L (ref 135–145)

## 2021-05-22 LAB — CBC
HCT: 54.9 % — ABNORMAL HIGH (ref 39.0–52.0)
Hemoglobin: 18.4 g/dL — ABNORMAL HIGH (ref 13.0–17.0)
MCH: 35.1 pg — ABNORMAL HIGH (ref 26.0–34.0)
MCHC: 33.5 g/dL (ref 30.0–36.0)
MCV: 104.8 fL — ABNORMAL HIGH (ref 80.0–100.0)
Platelets: 241 10*3/uL (ref 150–400)
RBC: 5.24 MIL/uL (ref 4.22–5.81)
RDW: 11.2 % — ABNORMAL LOW (ref 11.5–15.5)
WBC: 10.5 10*3/uL (ref 4.0–10.5)
nRBC: 0 % (ref 0.0–0.2)

## 2021-05-22 LAB — MRSA NEXT GEN BY PCR, NASAL: MRSA by PCR Next Gen: NOT DETECTED

## 2021-05-22 LAB — RAPID URINE DRUG SCREEN, HOSP PERFORMED
Amphetamines: POSITIVE — AB
Barbiturates: NOT DETECTED
Benzodiazepines: POSITIVE — AB
Cocaine: NOT DETECTED
Opiates: NOT DETECTED
Tetrahydrocannabinol: NOT DETECTED

## 2021-05-22 LAB — GLUCOSE, CAPILLARY
Glucose-Capillary: 82 mg/dL (ref 70–99)
Glucose-Capillary: 78 mg/dL (ref 70–99)

## 2021-05-22 LAB — RESP PANEL BY RT-PCR (FLU A&B, COVID) ARPGX2
Influenza A by PCR: NEGATIVE
Influenza B by PCR: NEGATIVE
SARS Coronavirus 2 by RT PCR: NEGATIVE

## 2021-05-22 LAB — ETHANOL: Alcohol, Ethyl (B): <10 mg/dL (ref <10)

## 2021-05-22 LAB — CBG MONITORING, ED: Glucose-Capillary: 139 mg/dL — ABNORMAL HIGH (ref 70–99)

## 2021-05-22 IMAGING — DX DG CHEST 1V PORT
1 series · 1 of 1 positions shown · non-contrast
Comparison: [DATE]

CLINICAL DATA: Seizures altered mental status

EXAM:
PORTABLE CHEST 1 VIEW

[chest]
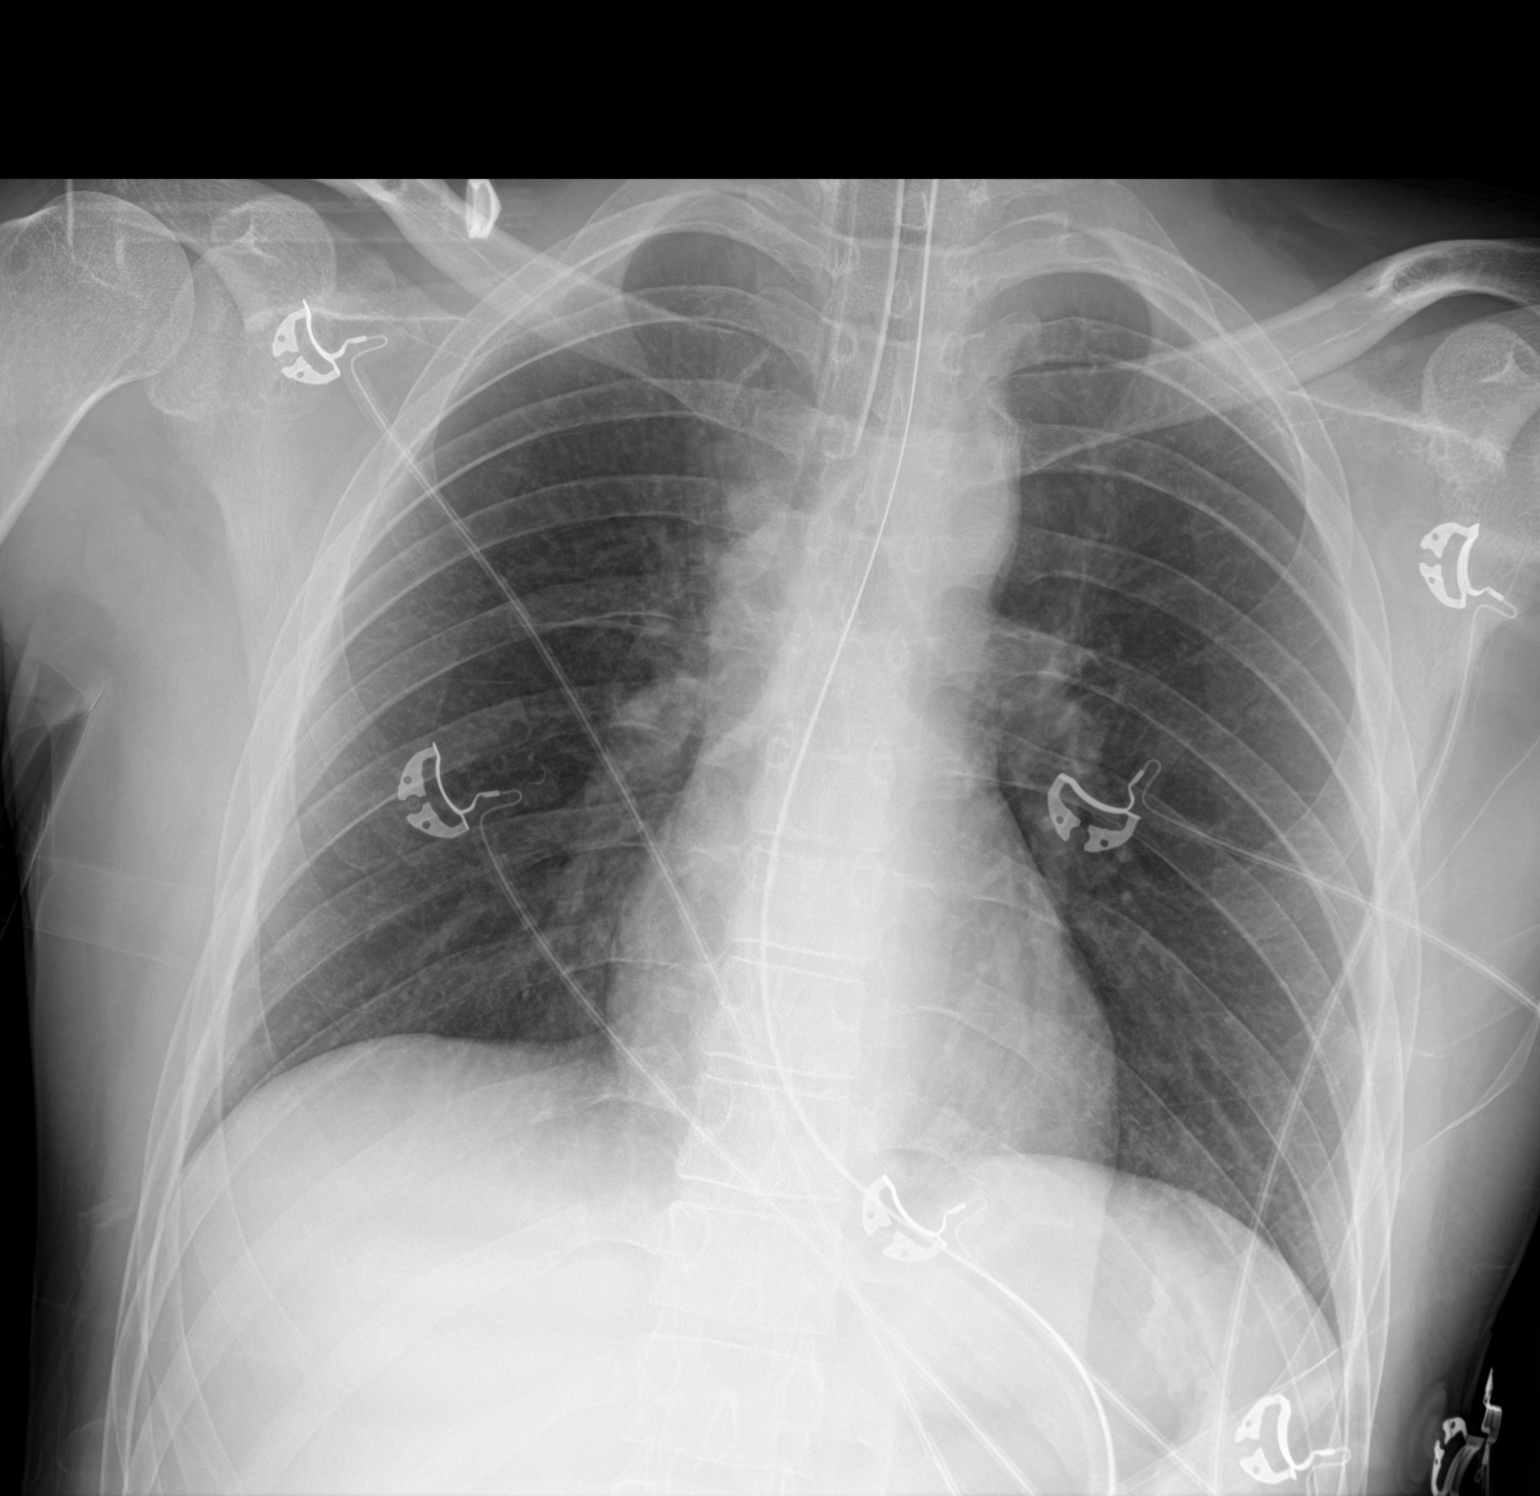

[1 of 1 positions shown; findings below may reference images not displayed]

FINDINGS: Tip of endotracheal tube is 3.2 cm above the carina. Enteric tube is
noted traversing the esophagus. Cardiac size is within normal
limits. There are no signs of pulmonary edema or focal pulmonary
consolidation. There is no pleural effusion or pneumothorax.
IMPRESSION: No active disease.

## 2021-05-22 IMAGING — CT CT HEAD W/O CM
4 series · 15 of 47 positions shown, 17 images · non-contrast
Comparison: [DATE]

Correlation: MR brain [DATE]

CLINICAL DATA: Witnessed seizure, history of brain tumor and
methamphetamine use



[Series 3: head without · axial · non-contrast · 0.43mm/px · z∈[-30,+90]mm · 7 of 33 slices shown, 9 images]
[im 5/33  brain]
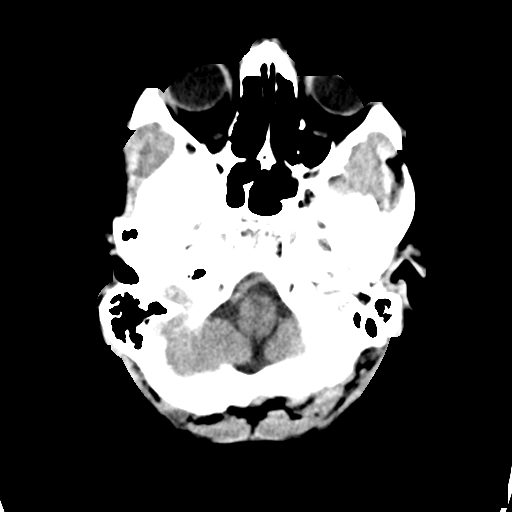
[im 5/33  bone]
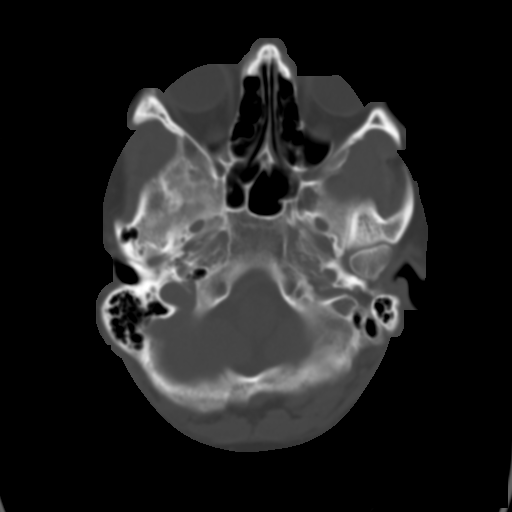
[im 9/33  brain]
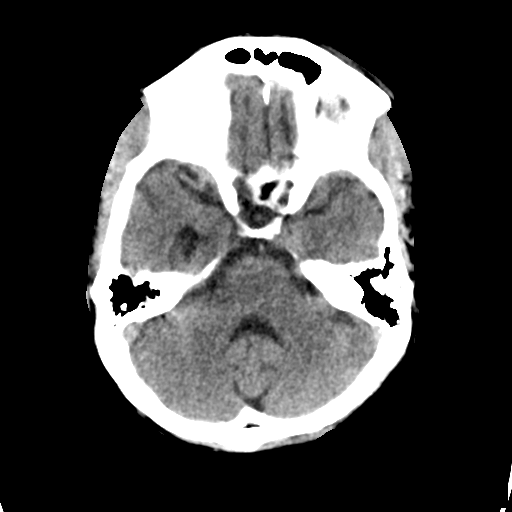
[im 13/33  brain]
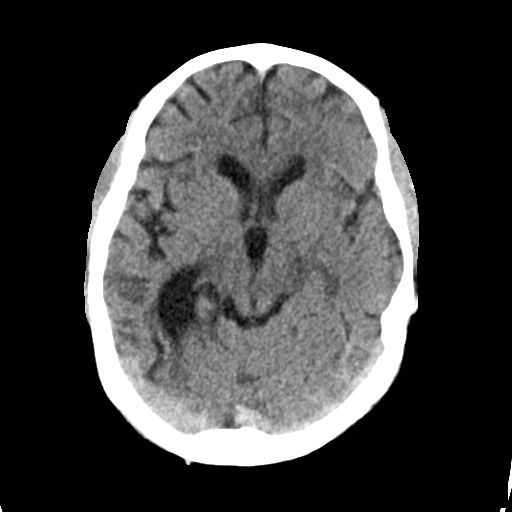
[im 17/33  brain]
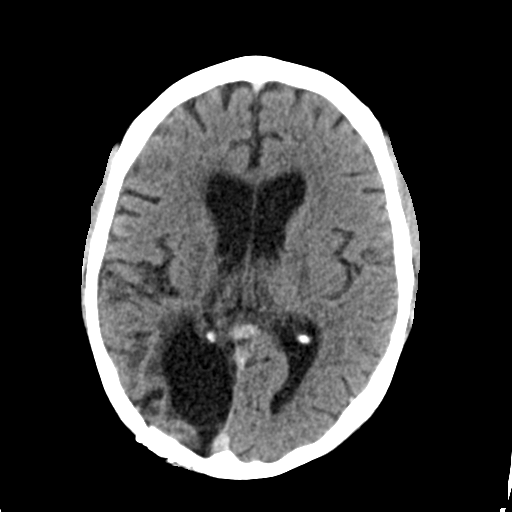
[im 21/33  brain]
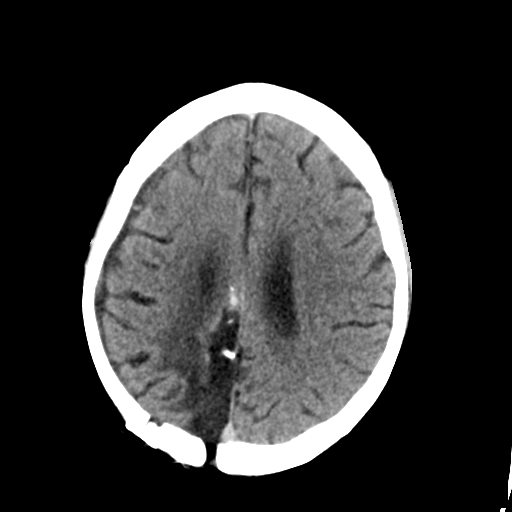
[im 21/33  bone]
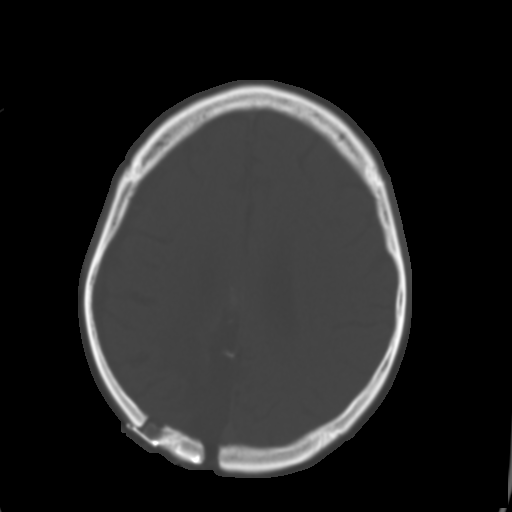
[im 25/33  brain]
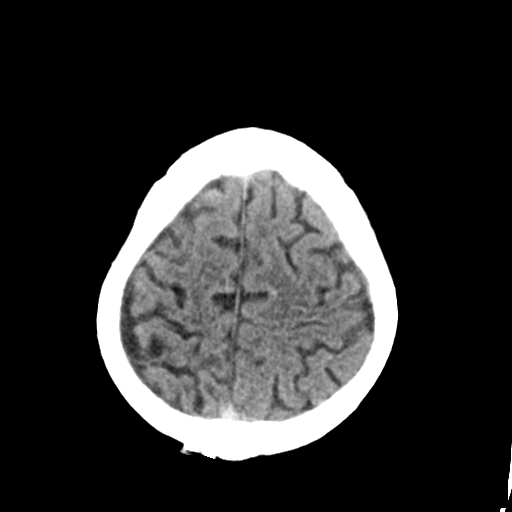
[im 29/33  brain]
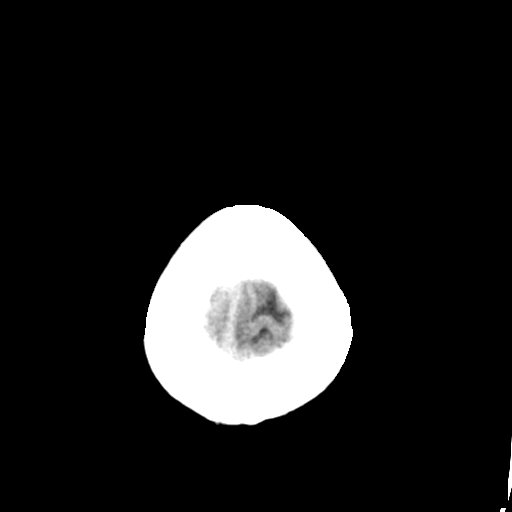

[Series 4: head bone · axial · 0.42mm/px · z∈[-35,-19]mm · 2 of 83 slices shown]
[im 9/83  bone]
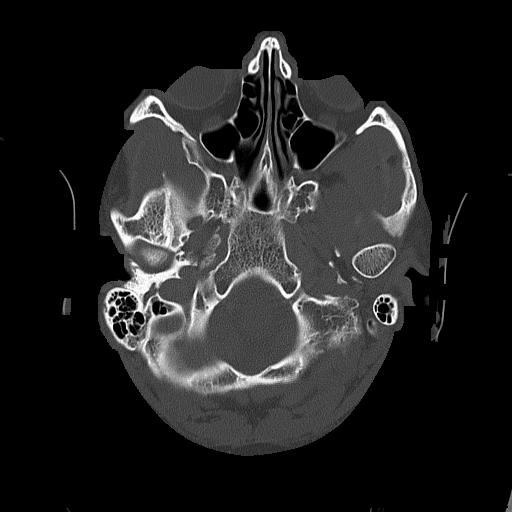
[im 17/83  bone]
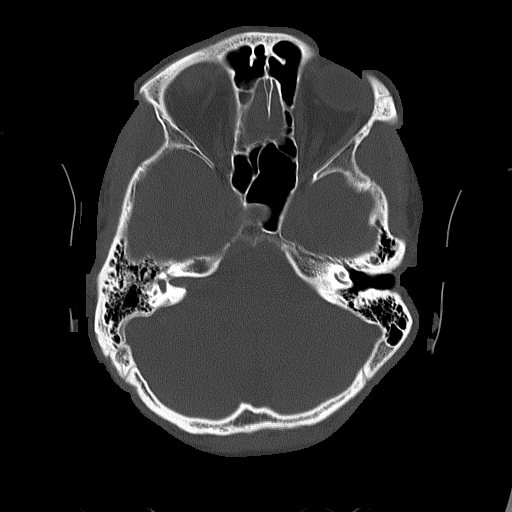

[Series 5: head without cor · coronal · non-contrast · 0.32mm/px · 3 of 65 slices shown]
[im 22/65  brain]
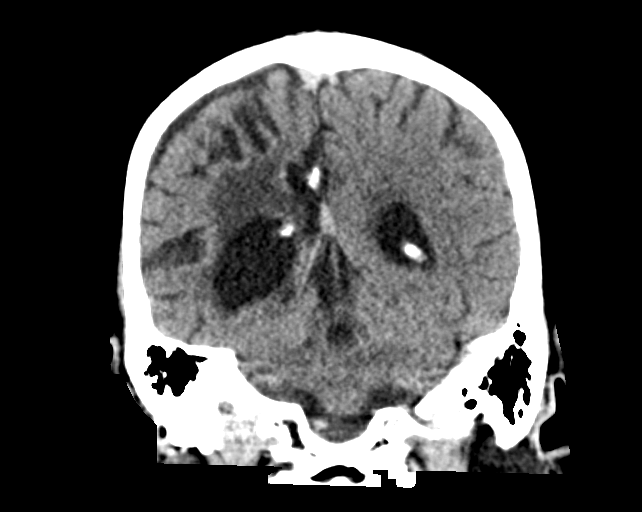
[im 29/65  brain]
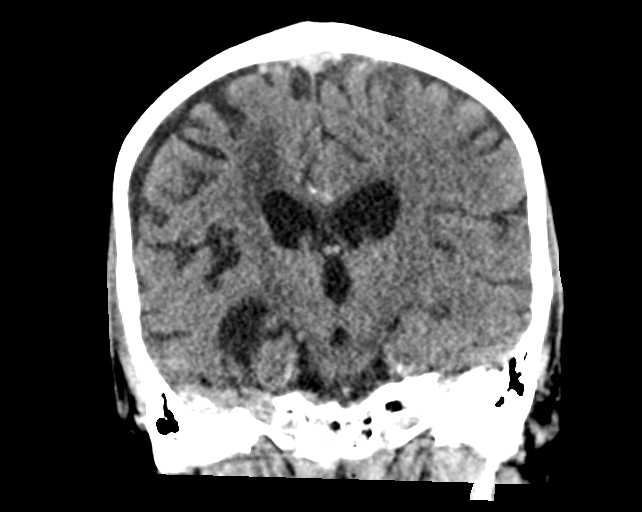
[im 36/65  brain]
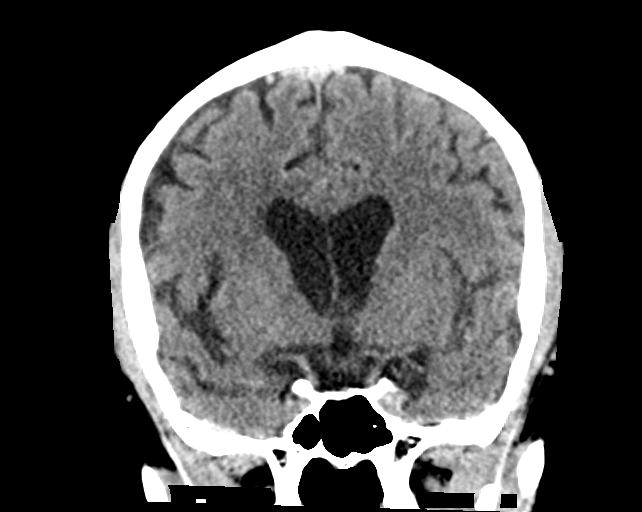

[Series 6: head without sag · sagittal · non-contrast · 0.32mm/px · 3 of 55 slices shown]
[im 19/55  brain]
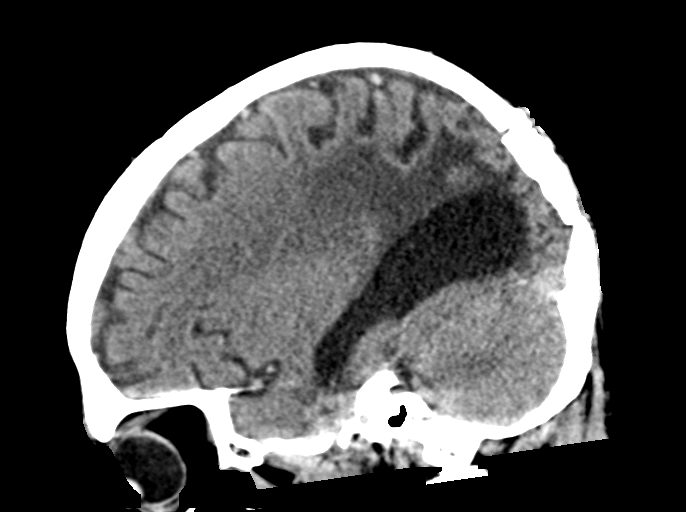
[im 28/55  brain]
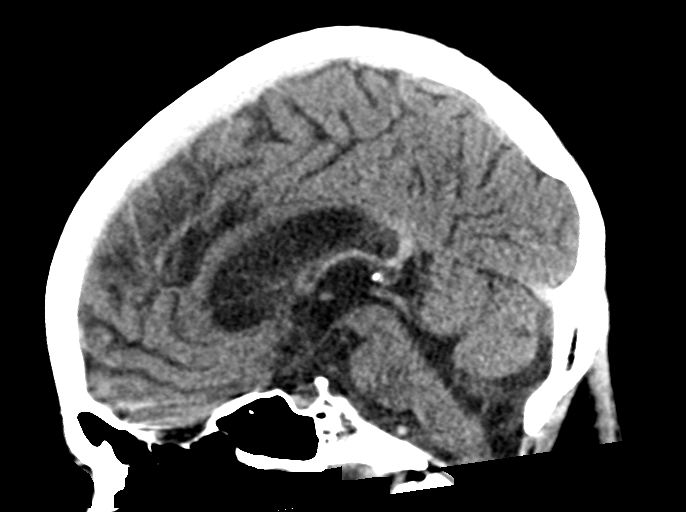
[im 37/55  brain]
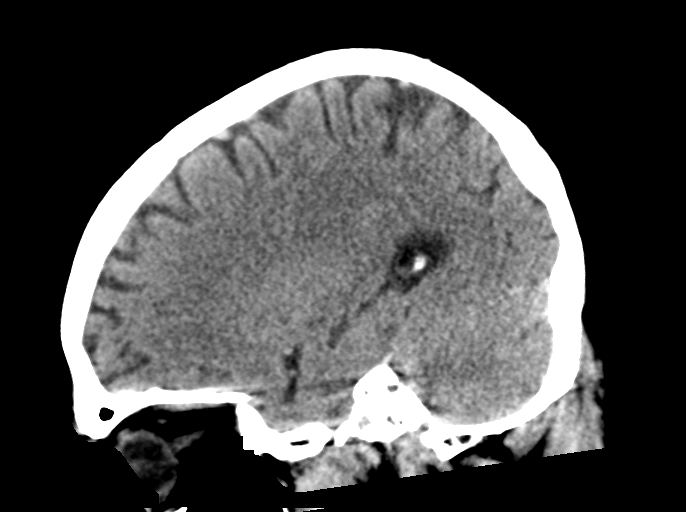

[15 of 47 positions shown; findings below may reference images not displayed]

FINDINGS: Brain: Mild atrophy in RIGHT hemisphere. Stable ventricular
morphology with ex vacuo dilatation of the atrium, occipital horn,
and temporal horn of the RIGHT lateral ventricle. Post craniotomy
changes RIGHT parietooccipital. Foci of dystrophic calcification at
the surgical bed. Chronic white matter hypoattenuation at posterior
LEFT centrum semiovale. No definite intracranial hemorrhage, mass
lesion or evidence of acute infarction. Posterior fossa
unremarkable.

Vascular: No hyperdense vessels

Skull: RIGHT posterior parieto-occipital craniotomy. Otherwise
intact.

Sinuses/Orbits: Clear

Other: N/A
IMPRESSION: Post craniotomy changes RIGHT parieto-occipital region.

Stable ventricular morphology with ex vacuo dilatation of the RIGHT
lateral ventricle.

Chronic white matter hypoattenuation at posterior LEFT centrum
semiovale.

No acute intracranial abnormalities.

## 2021-05-22 MED ORDER — CHLORHEXIDINE GLUCONATE 0.12% ORAL RINSE (MEDLINE KIT): 15.0000 mL | Freq: Two times a day (BID) | OROMUCOSAL | Status: DC

## 2021-05-22 MED ORDER — FENTANYL CITRATE PF 50 MCG/ML IJ SOSY: 50.0000 ug | PREFILLED_SYRINGE | INTRAMUSCULAR | Status: DC | PRN

## 2021-05-22 MED ORDER — LORAZEPAM 2 MG/ML IJ SOLN
INTRAMUSCULAR | Status: AC
  Administered 2021-05-22: 2 mg
  Filled 2021-05-22: qty 1

## 2021-05-22 MED ORDER — PANTOPRAZOLE 2 MG/ML SUSPENSION
40.0000 mg | Freq: Every day | ORAL | Status: DC
  Administered 2021-05-22 – 2021-05-24 (×3): 40 mg
  Filled 2021-05-22 (×4): qty 20

## 2021-05-22 MED ORDER — MIDAZOLAM-SODIUM CHLORIDE 100-0.9 MG/100ML-% IV SOLN
0.5000 mg/h | INTRAVENOUS | Status: DC
  Administered 2021-05-22: 10 mg/h via INTRAVENOUS
  Administered 2021-05-23: 6 mg/h via INTRAVENOUS
  Filled 2021-05-22 (×2): qty 100

## 2021-05-22 MED ORDER — SODIUM CHLORIDE 0.9 % IV SOLN: INTRAVENOUS | Status: DC

## 2021-05-22 MED ORDER — CHLORHEXIDINE GLUCONATE 0.12% ORAL RINSE (MEDLINE KIT)
15.0000 mL | Freq: Two times a day (BID) | OROMUCOSAL | Status: DC
  Administered 2021-05-22 – 2021-05-25 (×6): 15 mL via OROMUCOSAL

## 2021-05-22 MED ORDER — ETOMIDATE 2 MG/ML IV SOLN
INTRAVENOUS | Status: DC | PRN
  Administered 2021-05-22: 20 mg via INTRAVENOUS

## 2021-05-22 MED ORDER — ENOXAPARIN SODIUM 40 MG/0.4ML IJ SOSY
40.0000 mg | PREFILLED_SYRINGE | INTRAMUSCULAR | Status: DC
  Administered 2021-05-22 – 2021-05-26 (×5): 40 mg via SUBCUTANEOUS
  Filled 2021-05-22 (×5): qty 0.4

## 2021-05-22 MED ORDER — ORAL CARE MOUTH RINSE: 15.0000 mL | OROMUCOSAL | Status: DC

## 2021-05-22 MED ORDER — PROPOFOL 1000 MG/100ML IV EMUL
INTRAVENOUS | Status: AC
  Administered 2021-05-22: 30 ug/kg/min
  Administered 2021-05-22: 40 mg
  Filled 2021-05-22: qty 100

## 2021-05-22 MED ORDER — DOCUSATE SODIUM 50 MG/5ML PO LIQD
100.0000 mg | Freq: Two times a day (BID) | ORAL | Status: DC
  Administered 2021-05-22 – 2021-05-24 (×4): 100 mg
  Filled 2021-05-22 (×5): qty 10

## 2021-05-22 MED ORDER — SUCCINYLCHOLINE CHLORIDE 20 MG/ML IJ SOLN
INTRAMUSCULAR | Status: DC | PRN
  Administered 2021-05-22: 120 mg via INTRAVENOUS

## 2021-05-22 MED ORDER — LEVETIRACETAM IN NACL 1000 MG/100ML IV SOLN
2000.0000 mg | Freq: Once | INTRAVENOUS | Status: AC
  Administered 2021-05-22: 2000 mg via INTRAVENOUS

## 2021-05-22 MED ORDER — POLYETHYLENE GLYCOL 3350 17 G PO PACK
17.0000 g | PACK | Freq: Every day | ORAL | Status: DC
  Administered 2021-05-23 – 2021-05-24 (×2): 17 g
  Filled 2021-05-22 (×2): qty 1

## 2021-05-22 MED ORDER — MIDAZOLAM HCL 2 MG/2ML IJ SOLN
2.0000 mg | INTRAMUSCULAR | Status: DC | PRN
  Filled 2021-05-22: qty 2

## 2021-05-22 MED ORDER — PROPOFOL 1000 MG/100ML IV EMUL
5.0000 ug/kg/min | INTRAVENOUS | Status: DC
  Administered 2021-05-22: 40 ug/kg/min via INTRAVENOUS
  Administered 2021-05-22 – 2021-05-23 (×3): 50 ug/kg/min via INTRAVENOUS
  Administered 2021-05-23: 60 ug/kg/min via INTRAVENOUS
  Administered 2021-05-23: 50 ug/kg/min via INTRAVENOUS
  Administered 2021-05-23: 60 ug/kg/min via INTRAVENOUS
  Administered 2021-05-24: 50 ug/kg/min via INTRAVENOUS
  Administered 2021-05-24: 80 ug/kg/min via INTRAVENOUS
  Administered 2021-05-24: 70 ug/kg/min via INTRAVENOUS
  Administered 2021-05-24: 60 ug/kg/min via INTRAVENOUS
  Filled 2021-05-22 (×10): qty 100

## 2021-05-22 MED ORDER — MIDAZOLAM BOLUS VIA INFUSION
5.0000 mg | Freq: Once | INTRAVENOUS | Status: AC
  Administered 2021-05-22: 5 mg via INTRAVENOUS
  Filled 2021-05-22: qty 5

## 2021-05-22 MED ORDER — CHLORHEXIDINE GLUCONATE CLOTH 2 % EX PADS
6.0000 | MEDICATED_PAD | Freq: Every day | CUTANEOUS | Status: DC
  Administered 2021-05-22: 6 via TOPICAL

## 2021-05-22 MED ORDER — CHLORHEXIDINE GLUCONATE CLOTH 2 % EX PADS
6.0000 | MEDICATED_PAD | Freq: Every day | CUTANEOUS | Status: DC
  Administered 2021-05-23 – 2021-05-26 (×5): 6 via TOPICAL

## 2021-05-22 MED ORDER — VITAL HIGH PROTEIN PO LIQD
1000.0000 mL | ORAL | Status: DC
  Administered 2021-05-22: 1000 mL
  Filled 2021-05-22: qty 1000

## 2021-05-22 MED ORDER — MIDAZOLAM-SODIUM CHLORIDE 100-0.9 MG/100ML-% IV SOLN
INTRAVENOUS | Status: AC
  Administered 2021-05-22: 5 mg/h via INTRAVENOUS
  Filled 2021-05-22: qty 100

## 2021-05-22 MED ORDER — ORAL CARE MOUTH RINSE
15.0000 mL | OROMUCOSAL | Status: DC
  Administered 2021-05-22 – 2021-05-25 (×26): 15 mL via OROMUCOSAL

## 2021-05-22 MED ORDER — DEXTROSE-NACL 5-0.9 % IV SOLN: INTRAVENOUS | Status: DC

## 2021-05-22 MED ORDER — ACETAMINOPHEN 325 MG PO TABS
650.0000 mg | ORAL_TABLET | Freq: Four times a day (QID) | ORAL | Status: DC | PRN
  Administered 2021-05-22 – 2021-05-24 (×3): 650 mg
  Filled 2021-05-22 (×4): qty 2

## 2021-05-22 MED ORDER — LEVETIRACETAM IN NACL 500 MG/100ML IV SOLN
500.0000 mg | Freq: Two times a day (BID) | INTRAVENOUS | Status: DC
  Administered 2021-05-23 – 2021-05-26 (×7): 500 mg via INTRAVENOUS
  Filled 2021-05-22 (×8): qty 100

## 2021-05-22 NOTE — Consult Note (Signed)
Neurology Consultation  Reason for Consult: Seizures Referring Physician: Dr. Pattricia Boss  CC: Seizures  History is obtained from: Chart, mother  HPI: Stephen Beard is a 26 y.o. 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  Mother reports that he had in the past a addiction with methamphetamine but he had not been using for a while but somehow this past Thursday he got his hands on some crystal meth and he had been using it. She said he is compliant to his medications-had a neuro-oncology appointment on the sixth of the month.  Supposed to be on Lamictal, usually compliant with missed doses yesterday but the mother gave him dose of Lamictal this morning which he did seem to have taken. Patient unable to provide any history In the field, EMS noted the patient to be tachycardic and report cardioverting the patient.  He was also hypotensive and tachycardic for which he got some fluids without much help. On ED arrival, he was decerebrate posturing with ongoing rhythmic jerking of the right arm.  I happened to be in the emergency room and was called to see him prior to intubation.  He had to be intubated because he was unable to protect his airway and this had to be done emergently.   IRC:VELFYB to obtain due to altered mental status.   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[place])    Headache    Psychotic disorder (HCC)    Seizures ([place])    No family history on file.  Social History:   reports that he has been smoking. He has a 12.00 pack-year smoking history. His smokeless tobacco use includes chew. He reports current alcohol use. He reports current drug use. Drug: Cocaine.  Methamphetamine, alcohol according to the  mother  Medications  Current Facility-Administered Medications:    etomidate (AMIDATE) injection, , Intravenous, PRN, Pattricia Boss, MD, 20 mg at 05/22/21 1539   midazolam (VERSED) 100 mg/100 mL (1 mg/mL) premix infusion, 0.5-10 mg/hr, Intravenous, Continuous, Amie Portland, MD, Last Rate: 7.5 mL/hr at 05/22/21 1613, 7.5 mg/hr at 05/22/21 1613   propofol (DIPRIVAN) 1000 MG/100ML infusion, 5-80 mcg/kg/min, Intravenous, Titrated, Amie Portland, MD, Last Rate: 14.98 mL/hr at 05/22/21 1613, 40 mcg/kg/min at 05/22/21 1613   succinylcholine (ANECTINE) injection, , Intravenous, PRN, Pattricia Boss, MD, 120 mg at 05/22/21 1539  Current Outpatient Medications:    busPIRone (BUSPAR) 10 MG tablet, Take 1 tablet (10 mg total) by mouth 2 (two) times daily., Disp: 60 tablet, Rfl: 3   lamoTRIgine (LAMICTAL) 200 MG tablet, Take '250mg'$  in AM, '250mg'$  in PM. (Patient taking differently: Take 200 mg by mouth 2 (two) times daily.), Disp: 60 tablet, Rfl: 2   lamoTRIgine (LAMICTAL) 25 MG tablet, Take 2 tablets (50 mg total) by mouth 2 (two) times daily., Disp: 120 tablet, Rfl: 3   OLANZapine (ZYPREXA) 20 MG tablet, Take 1 tablet by mouth every evening., Disp: , Rfl:    sertraline (ZOLOFT) 50 MG tablet, Take 1 tablet by mouth once daily, Disp: 30 tablet, Rfl: 0   aluminum-magnesium hydroxide-simethicone (MAALOX) 017-510-25 MG/5ML SUSP, Take 30 mLs by mouth 4 (four) times daily -  before meals and at bedtime. (Patient not taking: Reported on 05/22/2021), Disp: 355 mL, Rfl: 0   famotidine (PEPCID) 20 MG  tablet, Take 1 tablet (20 mg total) by mouth 2 (two) times daily. (Patient not taking: Reported on 05/22/2021), Disp: 60 tablet, Rfl: 0   metoCLOPramide (REGLAN) 10 MG tablet, Take 1 tablet (10 mg total) by mouth every 6 (six) hours as needed. (Patient not taking: Reported on 05/22/2021), Disp: 30 tablet, Rfl: 0   ondansetron (ZOFRAN) 8 MG tablet, Take 1 tablet (8 mg total) by mouth 2 (two) times daily as needed (nausea and  vomiting). May take 30-60 minutes prior to Temodar administration if nausea/vomiting occurs. (Patient not taking: Reported on 05/22/2021), Disp: 30 tablet, Rfl: 1   traZODone (DESYREL) 100 MG tablet, Take 1 tablet (100 mg total) by mouth at bedtime as needed for sleep. (Patient not taking: Reported on 05/22/2021), Disp: 30 tablet, Rfl: 3   Exam: Current vital signs: BP (!) 107/93    Pulse (!) 143    Temp 98.6 F (37 C)    Resp (!) 23    Ht '5\' 7"'$  (1.702 m)    SpO2 100%    BMI 21.55 kg/m  Vital signs in last 24 hours: Temp:  [97.3 F (36.3 C)-98.6 F (37 C)] 98.6 F (37 C) (03/11 1605) Pulse Rate:  [139-162] 143 (03/11 1605) Resp:  [22-33] 23 (03/11 1605) BP: (107-184)/(85-126) 107/93 (03/11 1605) SpO2:  [100 %] 100 % (03/11 1605) FiO2 (%):  [100 %] 100 % (03/11 1541) General: Obtunded, does not respond to voice HEENT: Normocephalic atraumatic CVS: Extremely tachycardic and hypertensive Respiratory: Unable to protect his airway, scattered rales Abdomen nondistended nontender Extremities warm well perfused Neurological exam obtunded Not responding to voice Unresponsive to commands Nonverbal Cranial nerves: Pupils equal round reactive light, extraocular movements difficult to assess due to the emergent situation and multiple things going on at the bedside, no gaze preference or deviation, face appears symmetric. Motor examination with spontaneous right upper extremity movement along with some shaking.  On significant noxious stimulation, localization with both upper extremities.  Brisk withdrawal to noxious simulation in both lower extremities Sensory exam: As above Gait nation not tested DTRs were difficult to elicit Toes were mute Labs I have reviewed labs in epic and the results pertinent to this consultation are:  CBC    Component Value Date/Time   WBC 3.8 (L) 05/17/2021 1021   WBC 9.0 01/06/2021 1023   RBC 4.87 05/17/2021 1021   HGB 18.7 (H) 05/22/2021 1545   HGB 17.0  05/17/2021 1021   HCT 55.0 (H) 05/22/2021 1545   PLT 160 05/17/2021 1021   MCV 94.7 05/17/2021 1021   MCH 34.9 (H) 05/17/2021 1021   MCHC 36.9 (H) 05/17/2021 1021   RDW 11.4 (L) 05/17/2021 1021   LYMPHSABS 1.2 05/17/2021 1021   MONOABS 0.4 05/17/2021 1021   EOSABS 0.0 05/17/2021 1021   BASOSABS 0.0 05/17/2021 1021    CMP     Component Value Date/Time   NA 135 05/22/2021 1545   K 4.5 05/22/2021 1545   CL 106 05/22/2021 1545   CO2 26 05/17/2021 1021   GLUCOSE 112 (H) 05/22/2021 1545   BUN 16 05/22/2021 1545   CREATININE 1.10 05/22/2021 1545   CREATININE 0.95 05/17/2021 1021   CALCIUM 9.8 05/17/2021 1021   PROT 7.3 05/17/2021 1021   ALBUMIN 4.7 05/17/2021 1021   AST 37 05/17/2021 1021   ALT 48 (H) 05/17/2021 1021   ALKPHOS 65 05/17/2021 1021   BILITOT 1.1 05/17/2021 1021   GFRNONAA >60 05/17/2021 1021   GFRAA >60 12/05/2019 1206  Lipid Panel     Component Value Date/Time   TRIG 49 07/03/2019 2031     Imaging I have reviewed the images obtained:  CT-head stat after intubation-stigmata of prior right-sided craniotomy, enlarged right temporal and occipital horns.   Assessment: 26 year old with known oligodendroglioma status postresection and chemo, history of seizures on lamotrigine mostly compliant to medications with may be a missed dose yesterday, recently started abusing methamphetamine, brought in when he was noted to be seizing at the house.  Also extremely tachycardic requiring cardioversion by EMS-remained extremely encephalopathic and unable to protect his airway and emergently intubated. Concern for breakthrough seizures and status epilepticus.  Impression: -Breakthrough seizure and status epilepticus -History of brain tumor status postresection -History of methamphetamine abuse with remission and then relapse with current use as recent as today-leading to toxic metabolic encephalopathy.  Recommendations: -Emergent intubation per ER -Keppra 2 g IV  x1 -Keppra 500 twice daily to follow -He has OG tube-can start home dose of lamotrigine to 50 twice daily. -Stat EEG -Seizure precautions -Vent management per PCCM. -UA chest x-ray -MRI of the brain with and without contrast when stable -Hold off on steroids for now-May need to initiate depending on MRI findings but for now comparison of the MRI from May 17, 2021 to the CT today-allowing for the differences in technique-no significant change or evidence of excessive vasogenic edema. -Started on propofol for sedation-drop blood pressures too quick.  On propofol currently at 30 mcg/kg/min.  Keep at current rate.  Will recommend changes based on EEG. -Bolus Versed 5 mg x 1 followed by 5 mg/h on Versed drip.  Further recs based on EEG and clinical exam.  Plan discussed with Dr. Pryor Curia in the ER. Neurology will follow with you  -- Amie Portland, MD Neurologist [facility] Pager: [phone]    Addendum EEG with technically difficult study suggestive of moderate to severe encephalopathy, nonspecific in etiology.  No seizures or epileptiform discharges seen although too much muscle artifact precludes a good read.  He had multiple episodes of whole body stiffening trembling with right arm elevation during the study without concomitant EEG change-these episodes were' \\without'$  electrographic correlate. Continue on LTM EEG.   CRITICAL CARE ATTESTATION Performed by: Amie Portland, MD Total critical care time: 45 minutes Critical care time was exclusive of separately billable procedures and treating other patients and/or supervising APPs/Residents/Students Critical care was necessary to treat or prevent imminent or life-threatening deterioration due to breakthrough seizure, status epilepticus, altered mental status and toxic metabolic encephalopathy due to drug use This patient is critically ill and at significant risk for neurological worsening and/or death and care requires  constant monitoring. Critical care was time spent personally by me on the following activities: development of treatment plan with patient and/or surrogate as well as nursing, discussions with consultants, evaluation of patient's response to treatment, examination of patient, obtaining history from patient or surrogate, ordering and performing treatments and interventions, ordering and review of laboratory studies, ordering and review of radiographic studies, pulse oximetry, re-evaluation of patient's condition, participation in multidisciplinary rounds and medical decision making of high complexity in the care of this patient.

## 2021-05-22 NOTE — ED Provider Notes (Addendum)
[facility] EMERGENCY DEPARTMENT Provider Note   CSN: [id number] Arrival date & time: 05/22/21  1531     History  Chief Complaint  Patient presents with   Seizures    Stephen Beard is a 26 y.o. male.  HPI 26 year old male presents today with grand mal seizures.  EMS reports history of brain tumor.  They report that he has been using meth for the past several days.  Is unclear if he has been taking his home medications.  He is given 7.5 of Ativan prehospital IV.  EMS reports that he was sitting on the couch and having grand mal seizures upon their arrival.  Mother reports that he has been taking his meds indluding this am.  He has been taking meth since Thursday.   Home Medications Prior to Admission medications   Medication Sig Start Date End Date Taking? Authorizing Provider  busPIRone (BUSPAR) 10 MG tablet Take 1 tablet (10 mg total) by mouth 2 (two) times daily. 02/15/21  Yes Vaslow, Acey Lav, MD  lamoTRIgine (LAMICTAL) 200 MG tablet Take 290m in AM, 2574min PM. Patient taking differently: Take 200 mg by mouth 2 (two) times daily. 02/15/21  Yes Vaslow, ZaAcey LavMD  lamoTRIgine (LAMICTAL) 25 MG tablet Take 2 tablets (50 mg total) by mouth 2 (two) times daily. 04/02/21  Yes Vaslow, ZaAcey LavMD  OLANZapine (ZYPREXA) 20 MG tablet Take 1 tablet by mouth every evening. 10/27/20  Yes [provider]  sertraline (ZOLOFT) 50 MG tablet Take 1 tablet by mouth once daily Patient taking differently: Take 50 mg by mouth daily. 04/02/21  Yes Vaslow, ZaAcey LavMD  traZODone (DESYREL) 100 MG tablet Take 1 tablet (100 mg total) by mouth at bedtime as needed for sleep. Patient not taking: Reported on 05/22/2021 02/15/21   VaVentura SellersMD      Allergies    Patient has no known allergies.    Review of Systems   Review of Systems  Unable to perform ROS: Acuity of condition   Physical Exam Updated Vital Signs BP 130/90    Pulse (!) 132    Temp 99 F  (37.2 C)    Resp 18    Ht 1.702 m (_0 )    SpO2 100%    BMI 21.55 kg/m  Physical Exam Vitals and nursing note reviewed.  Constitutional:      General: He is in acute distress.     Appearance: Normal appearance.  HENT:     Head: Normocephalic.     Right Ear: External ear normal.     Left Ear: External ear normal.     Nose: Nose normal.     Mouth/Throat:     Mouth: Mucous membranes are moist.     Pharynx: Oropharynx is clear.  Eyes:     Extraocular Movements: Extraocular movements intact.     Pupils: Pupils are equal, round, and reactive to light.  Neck:     Comments: Trachea midline no JVD noted Cardiovascular:     Rate and Rhythm: Regular rhythm. Tachycardia present.  Pulmonary:     Effort: Pulmonary effort is normal.     Breath sounds: Normal breath sounds.  Abdominal:     General: Abdomen is flat. Bowel sounds are normal.     Palpations: Abdomen is soft.  Musculoskeletal:        General: Normal range of motion.  Skin:    General: Skin is warm and dry.  Capillary Refill: Capillary refill takes less than 2 seconds.  Neurological:     Mental Status: He is alert.     Comments: Patient with decorticate posturing on right greater than left Patient localizes to pain No spontaneous eye opening    ED Results / Procedures / Treatments   Labs (all labs ordered are listed, but only abnormal results are displayed) Labs Reviewed  CBC - Abnormal; Notable for the following components:      Result Value   Hemoglobin 18.4 (*)    HCT 54.9 (*)    MCV 104.8 (*)    MCH 35.1 (*)    RDW 11.2 (*)    All other components within normal limits  I-STAT CHEM 8, ED - Abnormal; Notable for the following components:   Glucose, Bld 112 (*)    Calcium, Ion 1.03 (*)    TCO2 9 (*)    Hemoglobin 18.7 (*)    HCT 55.0 (*)    All other components within normal limits  CBG MONITORING, ED - Abnormal; Notable for the following components:   Glucose-Capillary 139 (*)    All other components  within normal limits  I-STAT ARTERIAL BLOOD GAS, ED - Abnormal; Notable for the following components:   pH, Arterial 7.219 (*)    pO2, Arterial 352 (*)    Bicarbonate 13.8 (*)    TCO2 15 (*)    Acid-base deficit 13.0 (*)    Sodium 133 (*)    Calcium, Ion 1.14 (*)    All other components within normal limits  MRSA NEXT GEN BY PCR, NASAL  RESP PANEL BY RT-PCR (FLU A&B, COVID) ARPGX2  BASIC METABOLIC PANEL  ETHANOL  RAPID URINE DRUG SCREEN, HOSP PERFORMED  HEPATIC FUNCTION PANEL  LAMOTRIGINE LEVEL    EKG EKG Interpretation  Date/Time:  Saturday May 22 2021 15:44:58 EST Ventricular Rate:  157 PR Interval:  100 QRS Duration: 95 QT Interval:  275 QTC Calculation: 445 R Axis:   99 Text Interpretation: Sinus tachycardia Borderline right axis deviation Borderline T wave abnormalities Confirmed by Pattricia Boss [phone]) on 05/22/2021 4:25:54 PM  Radiology CT Head Wo Contrast  Result Date: 05/22/2021 CLINICAL DATA:  Witnessed seizure, history of brain tumor and methamphetamine use EXAM: CT HEAD WITHOUT CONTRAST TECHNIQUE: Contiguous axial images were obtained from the base of the skull through the vertex without intravenous contrast. RADIATION DOSE REDUCTION: This exam was performed according to the departmental dose-optimization program which includes automated exposure control, adjustment of the mA and/or kV according to patient size and/or use of iterative reconstruction technique. COMPARISON:  01/04/2020 Correlation: MR brain 05/14/2021 FINDINGS: Brain: Mild atrophy in RIGHT hemisphere. Stable ventricular morphology with ex vacuo dilatation of the atrium, occipital horn, and temporal horn of the RIGHT lateral ventricle. Post craniotomy changes RIGHT parietooccipital. Foci of dystrophic calcification at the surgical bed. Chronic white matter hypoattenuation at posterior LEFT centrum semiovale. No definite intracranial hemorrhage, mass lesion or evidence of acute infarction. Posterior fossa  unremarkable. Vascular: No hyperdense vessels Skull: RIGHT posterior parieto-occipital craniotomy. Otherwise intact. Sinuses/Orbits: Clear Other: N/A IMPRESSION: Post craniotomy changes RIGHT parieto-occipital region. Stable ventricular morphology with ex vacuo dilatation of the RIGHT lateral ventricle. Chronic white matter hypoattenuation at posterior LEFT centrum semiovale. No acute intracranial abnormalities. Electronically Signed   By: Lavonia Dana M.D.   On: 05/22/2021 16:29   DG Chest Portable 1 View  Result Date: 05/22/2021 CLINICAL DATA:  Seizures altered mental status EXAM: PORTABLE CHEST 1 VIEW COMPARISON:  07/07/2019 FINDINGS: Tip  of endotracheal tube is 3.2 cm above the carina. Enteric tube is noted traversing the esophagus. Cardiac size is within normal limits. There are no signs of pulmonary edema or focal pulmonary consolidation. There is no pleural effusion or pneumothorax. IMPRESSION: No active disease. Electronically Signed   By: Elmer Picker M.D.   On: 05/22/2021 15:51    Procedures Date/Time: 05/22/2021 3:53 PM Performed by: Pattricia Boss, MD Pre-anesthesia Checklist: Patient identified, Emergency Drugs available, Suction available, Patient being monitored and Timeout performed Oxygen Delivery Method: Non-rebreather mask Preoxygenation: Pre-oxygenation with 100% oxygen Induction Type: Rapid sequence Ventilation: Mask ventilation without difficulty Laryngoscope Size: Glidescope and 4 Tube size: 8.0 mm Number of attempts: 1 Airway Equipment and Method: Patient positioned with wedge pillow and Video-laryngoscopy Placement Confirmation: ETT inserted through vocal cords under direct vision, Positive ETCO2, CO2 detector and Breath sounds checked- equal and bilateral Secured at: 25 cm Tube secured with: ETT holder Dental Injury: Teeth and Oropharynx as per pre-operative assessment        Medications Ordered in ED Medications  etomidate (AMIDATE) injection (20 mg  Intravenous Given 05/22/21 1539)  succinylcholine (ANECTINE) injection (120 mg Intravenous Given 05/22/21 1539)  midazolam (VERSED) 100 mg/100 mL (1 mg/mL) premix infusion (10 mg/hr Intravenous Rate/Dose Change 05/22/21 1629)  propofol (DIPRIVAN) 1000 MG/100ML infusion (40 mcg/kg/min  62.4 kg Intravenous New Bag/Given 05/22/21 1613)  levETIRAcetam (KEPPRA) IVPB 500 mg/100 mL premix (has no administration in time range)  docusate (COLACE) 50 MG/5ML liquid 100 mg (has no administration in time range)  polyethylene glycol (MIRALAX / GLYCOLAX) packet 17 g (has no administration in time range)  chlorhexidine gluconate (MEDLINE KIT) (PERIDEX) 0.12 % solution 15 mL (has no administration in time range)  MEDLINE mouth rinse (has no administration in time range)  fentaNYL (SUBLIMAZE) injection 50-200 mcg (has no administration in time range)  pantoprazole sodium (PROTONIX) 40 mg/20 mL oral suspension 40 mg (has no administration in time range)  Chlorhexidine Gluconate Cloth 2 % PADS 6 each (has no administration in time range)  enoxaparin (LOVENOX) injection 40 mg (has no administration in time range)  0.9 %  sodium chloride infusion (has no administration in time range)  midazolam (VERSED) injection 2 mg (has no administration in time range)  acetaminophen (TYLENOL) tablet 650 mg (has no administration in time range)  LORazepam (ATIVAN) 2 MG/ML injection (2 mg  Given 05/22/21 1540)  propofol (DIPRIVAN) 1000 MG/100ML infusion (0 mcg/kg/min  Stopped 05/22/21 1612)  levETIRAcetam (KEPPRA) IVPB 1000 mg/100 mL premix (0 mg Intravenous Stopped 05/22/21 1600)  midazolam (VERSED) bolus via infusion 5 mg (5 mg Intravenous Bolus from Bag 05/22/21 1611)    ED Course/ Medical Decision Making/ A&P Clinical Course as of 05/22/21 1650  Sat May 22, 2021  1604 I-STAT personally reviewed and interpreted with slightly elevated glucose at 112, normal electrolytes, globin 18 and normal [DR]  1605 CXR reviewed with ET tube in  place 3.2 cm above carina with G-tube in place no other signs of abnormalities noted [DR]  1627 EEG ensuing Heart rate 140 Blood pressure 107/93 Propofol drip in place Versed drip in place Plan fluid bolus [DR]  1635 Head CT personally reviewed and interpreted and did not show significant changes from MRI several days ago. 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  Patient on lamictal 300 bid.  Patient reported to be taking meth x 3 days. He was found to have grand mal seizure today.  Significantly tachycardia and hypertensive prehospital. Reported cardioverted x 2 prehospital, unclear rhythms. Here in ED with right sided decerebrate posturing, ongoing rhythmic jerking of right arm. Patient treated here with ativan, keppra RSI initiated and patient intubated. Bedside cxr with good tube placement. CT pending Dr.Aurora at bedside on evaluation prior to intubation Differential diagnosis including seizure due to his prior brain tumor, new acute intracranial hemorrhage or abnormality, drug use of illicit substances, noncompliance of medications, other metabolic abnormalities including infection, electrolyte abnormality.   Amount and/or Complexity of Data Reviewed Independent Historian: caregiver and EMS    Details: Patient being treated for oligoglendoima Prehospital treatment as per HPI External Data Reviewed: radiology and notes.    Details: Reviewed Dr. Renda Rolls notes and prehospital MRI done several days ago Labs:  ordered. Radiology: ordered. Discussion of management or test interpretation with external provider(s): Discussed with Dr. Malen Gauze, on-call for neurology Discussed with critical care and they will have assumed care at this time   Risk Prescription drug management. Parenteral controlled substances. Decision regarding hospitalization. Risk Details: 26 year old male critically ill with altered mental status and known tumor.  Patient intubated in ED and treated with seizure medications and sedation. IV fluids infusing patient significantly tachycardic Patient appears stabilized in the ED for transfer to critical care unit  Critical Care Total time providing critical care: 45 minutes          Final Clinical Impression(s) / ED Diagnoses Final diagnoses:  Respiratory failure ([place])  Status epilepticus ([place])  Brain tumor [facility])    Rx / [place] Orders ED Discharge Orders     None         Pattricia Boss, MD 05/22/21 1650    Pattricia Boss, MD 05/22/21 1650

## 2021-05-22 NOTE — Progress Notes (Addendum)
eLink Physician-Brief Progress Note ?Patient Name: Stephen Beard ?DOB: Dec 06, 1995 ?MRN: [id number] ? ? ?Date of Service ? 05/22/2021  ?HPI/Events of Note ? Glucose in 70s while on tube feeds only at 20 cc/hr. Is also on NS .   ?eICU Interventions ? Change fluids to d5 NS at 100 cc/hr ?Continue glucose checks as planned ?D/w RN   ? ? ? ?Intervention Category ?Intermediate Interventions: Hyperglycemia - evaluation and treatment ? ?Stephen Beard ?05/22/2021, 11:19 PM ? ?Addendum at 4:55 am ?K is 3.4 ?E link repletion protocol ordered  ?

## 2021-05-22 NOTE — Progress Notes (Signed)
Pt was transported to 5T01 without complication.  ?

## 2021-05-22 NOTE — Progress Notes (Signed)
Chaplain responded to pt's nurse request to minister to pt's mother who is very upset following physician report of her son's condition.  Chaplain visited with mother in family waiting area, offering ministry of presence as mother explained her son had a seizure which she witnessed and now learning her son is "on life support.".  Upon discerning mother was calm enough to handle a visit to her son's bedside,  Chaplain escorted mom and two other family members to bedside.  Family members present were very attentive to mother.  Chaplain invited mother to have nurse page if additional support from [place] is needed. ? ?De Burrs ?Chaplain ?

## 2021-05-22 NOTE — Progress Notes (Signed)
EEG complete - results pending 

## 2021-05-22 NOTE — Procedures (Signed)
Patient Name: Stephen Beard  ?MRN: [id number]  ?Epilepsy Attending: Lora Havens  ?Referring Physician/Provider: Amie [place], M ?Date: 05/22/2021 ?Duration: 24.46 mins ? ?Patient history: 26yo m presented with seizure like episodes. EEG to evaluate for seizure ? ?Level of alertness: lethargic  ? ?AEDs during EEG study: Ativan, LEV, propofol ? ?Technical aspects: This EEG study was done with scalp electrodes positioned according to the 10-20 International system of electrode placement. Electrical activity was acquired at a sampling rate of '500Hz'$  and reviewed with a high frequency filter of '70Hz'$  and a low frequency filter of '1Hz'$ . EEG data were recorded continuously and digitally stored.  ? ?Description: EEG showed continuous generalized 3 to 6 Hz theta-delta slowing admixed with an excessive amount of 15 to 18 Hz beta activity distributed symmetrically and diffusely.  ? ?Patient was noted to have multiple episodes of whole body stiffening, trembling with right arm elevation during the study. Concomitant EEG before, during and after the event did not show any EEG changes suggest seizure. ? ?Hyperventilation and photic stimulation were not performed.    ? ?Of note, study was technically difficult due to significant myogenic artifact. ? ?ABNORMALITY ?- Continuous slow, generalized ?- Excessive beta, generalized ? ?IMPRESSION: ?This technically difficult study is suggestive of moderate to severe diffuse encephalopathy, nonspecific etiology. No seizures or epileptiform discharges were seen throughout the recording. ? ?Patient was noted to have multiple episodes of whole body stiffening, trembling with right arm elevation during the study without concomitant EEG change. These episodes were most likely NOT epileptic. ? ?Lora Havens  ? ?

## 2021-05-22 NOTE — H&P (Signed)
? ?NAME:  Stephen Beard, MRN:  [id number], DOB:  26-Aug-1995, LOS: 0 ?ADMISSION DATE:  05/22/2021, CONSULTATION DATE:  05/22/2021 ?REFERRING MD:  Dr. Jeanell Sparrow, ER, CHIEF COMPLAINT:  Seizure  ? ?History of Present Illness:  ?26 yo male smoker with hx of Rt occipital lobe oligodendroglioma s/p craniectomy, seizure disorder, ADD and substance abuse had witnessed seizure at home.  He continued to have seizure activity in ER.  Required intubation for airway protection.  Started on diprivan and then versed infusions.  Seen by neuro in ER and started on LTM.  Reported had been on binge of methamphetamine for several days prior to admission.  Uncertain if he has been compliant with medications as outpt. ? ?Hx from chart and medical team.  ? 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 ?Significant Hospital Events: ?Including procedures, antibiotic start and stop dates in addition to other pertinent events   ?3/11 Admit, neuro consulted, intubated, start LTM ? ?Studies:  ?CT head 3/11 >> Rt hemisphere mild atrophy, stable ventriculomegaly with ex vacuo dilation of atrium, occipital horn, and temporal horn of Rt lateral ventricle.  Post craniotomy changes Rt parietooccipital.  Foci of dystrophic calcification at the surgical bed.  Chronic white matter hypoattenuation at posterior Lt centrum semiovale. ? ? ?Interim History / Subjective:  ?Had cardioversion by EMS for tachycardia (seems to have been sinus tachycardia).  Remains tachycardic.  No further obvious seizure activity since started diprivan and then versed. ? ?Objective   ?Blood pressure (!) 107/93, pulse (!) 143, temperature 98.6 ?F (37 ?C), resp. rate (!) 23, height '5\' 7"'$  (1.702 m), SpO2 100 %. ?   ?Vent Mode: PRVC ?FiO2 (%):  [60 %-100 %] 60 % ?Set Rate:  [16 bmp] 16 bmp ?Vt Set:  [520 mL] 520 mL ?PEEP:  [5 cmH20] 5 cmH20 ?Plateau  Pressure:  [14 cmH20] 14 cmH20  ?No intake or output data in the 24 hours ending 05/22/21 1639 ?There were no vitals filed for this visit. ? ?Examination: ? ?General - sedated ?Eyes - pupils reactive ?ENT - ETT in place ?Cardiac - regular rate/rhythm, no murmur ?Chest - b/l rales ?Abdomen - soft, non tender, + bowel sounds ?Extremities - decreased muscle bulk; no cyanosis, clubbing, or edema ?Skin - no rashes ?Neuro - RASS -3 ? ?Resolved Hospital Problem list   ? ? ?Assessment & Plan:  ? ?Status epilepticus with hx of seizure disorder in setting of Rt occipital oligodendroglioma s/p craniotomy. ?- neurology consulted ?- started on LTM ?- continue deep sedation for RASS goal -3 to -4 ?- continue keppra   ?- hold outpt lamictal for now ? ?Hx of Schizoaffective disorder, ADD, Depression, Polysubstance abuse. ?- f/u UDS ?- hold outpt bupsar, zyprexa, zoloft, trazodone for now ? ?Compromised airway. ?- full vent support ?- f/u CXR ?- monitor for signs of aspiration; defer antibiotics for now ? ?Sinus tachycardia. ?- likely from hypovolemia and possible substance abuse ?- continue IV fluids ?- monitor on telemetry ? ?Best Practice (right click and "Reselect all SmartList Selections" daily)  ? ?Diet/type: tubefeeds ?DVT prophylaxis: LMWH ?GI prophylaxis: PPI ?Lines: N/A ?Foley:  N/A ?Code Status:  full code ?Last date of multidisciplinary goals of care discussion '[x]'$  ? ?Labs   ?CBC: ?Recent Labs  ?Lab 05/17/21 ?1021 05/22/21 ?1543 05/22/21 ?1545  ?WBC 3.8* 10.5  --   ?NEUTROABS 2.2  --   --   ?HGB 17.0 18.4*  18.7*  ?HCT 46.1 54.9* 55.0*  ?MCV 94.7 104.8*  --   ?PLT 160 241  --   ? ? ?Basic Metabolic Panel: ?Recent Labs  ?Lab 05/17/21 ?1021 05/22/21 ?1545  ?NA 135 135  ?K 3.9 4.5  ?CL 101 106  ?CO2 26  --   ?GLUCOSE 98 112*  ?BUN 18 16  ?CREATININE 0.95 1.10  ?CALCIUM 9.8  --   ? ?GFR: ?Estimated Creatinine Clearance: 89.8 mL/min (by C-G formula based on SCr of 1.1 mg/dL). ?Recent Labs  ?Lab 05/17/21 ?1021 05/22/21 ?1543   ?WBC 3.8* 10.5  ? ? ?Liver Function Tests: ?Recent Labs  ?Lab 05/17/21 ?1021  ?AST 37  ?ALT 48*  ?ALKPHOS 65  ?BILITOT 1.1  ?PROT 7.3  ?ALBUMIN 4.7  ? ?No results for input(s): LIPASE, AMYLASE in the last 168 hours. ?No results for input(s): AMMONIA in the last 168 hours. ? ?ABG ?   ?Component Value Date/Time  ? PHART 7.274 (L) 07/03/2019 2117  ? PCO2ART 45.4 07/03/2019 2117  ? PO2ART 69 (L) 07/03/2019 2117  ? HCO3 21.1 07/03/2019 2117  ? TCO2 9 (L) 05/22/2021 1545  ? ACIDBASEDEF 6.0 (H) 07/03/2019 2117  ? O2SAT 91.0 07/03/2019 2117  ?  ? ?Coagulation Profile: ?No results for input(s): INR, PROTIME in the last 168 hours. ? ?Cardiac Enzymes: ?No results for input(s): CKTOTAL, CKMB, CKMBINDEX, TROPONINI in the last 168 hours. ? ?HbA1C: ?No results found for: HGBA1C ? ?CBG: ?Recent Labs  ?Lab 05/22/21 ?1614  ?GLUCAP 139*  ? ? ?Review of Systems:   ?Unable to obtain ? 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  ? ?Surgical History:  ? ?Past Surgical History:  ?Procedure Laterality Date  ? APPLICATION OF CRANIAL NAVIGATION N/A 07/02/2019  ? Procedure: APPLICATION OF CRANIAL NAVIGATION;  Surgeon: Judith Part, MD;  Location: [place];  Service: Neurosurgery;  Laterality: N/A;  ? CRANIOTOMY N/A 07/03/2019  ? Procedure: CRANIOTOMY HEMATOMA EVACUATION SUBDURAL;  Surgeon: Judith Part, MD;  Location: [place] [facility];  Service: Neurosurgery;  Laterality: N/A;  ? CRANIOTOMY Right 07/03/2019  ? Procedure: CRANIOTOMY HEMATOMA EVACUATION SUBDURAL;  Surgeon: Judith Part, MD;  Location: [place];  Service: Neurosurgery;  Laterality: Right;  ? CRANIOTOMY Right 07/02/2019  ? Procedure: CRANIOTOMY TUMOR EXCISION with Lucky Rathke;  Surgeon: Judith Part, MD;  Location: [place];  Service: Neurosurgery;  Laterality: Right;  posterior  ?  ? ?Social History:  ? reports that he has been  smoking. He has a 12.00 pack-year smoking history. His smokeless tobacco use includes chew. He reports current alcohol use. He reports current drug use. Drug: Cocaine.  ? ?Family History:  ?His family history is not on file.  ? ?Allergies ?No Known Allergies  ? ?Home Medications  ?Prior to Admission medications   ?Medication Sig Start Date End Date Taking? Authorizing Provider  ?busPIRone (BUSPAR) 10 MG tablet Take 1 tablet (10 mg total) by mouth 2 (two) times daily. 02/15/21  Yes Vaslow, Acey Lav, MD  ?lamoTRIgine (LAMICTAL) 200 MG tablet Take '250mg'$  in AM, '250mg'$  in PM. ?Patient taking differently: Take 200 mg by mouth 2 (two) times daily. 02/15/21  Yes Vaslow, Acey Lav, MD  ?lamoTRIgine (LAMICTAL) 25 MG tablet Take 2 tablets (50 mg total) by mouth 2 (two) times daily. 04/02/21  Yes Vaslow, Acey Lav, MD  ?OLANZapine (ZYPREXA) 20 MG tablet Take 1 tablet by mouth every  evening. 10/27/20  Yes [provider]  ?sertraline (ZOLOFT) 50 MG tablet Take 1 tablet by mouth once daily ?Patient taking differently: Take 50 mg by mouth daily. 04/02/21  Yes Vaslow, Acey Lav, MD  ?traZODone (DESYREL) 100 MG tablet Take 1 tablet (100 mg total) by mouth at bedtime as needed for sleep. ?Patient not taking: Reported on 05/22/2021 02/15/21   [name], MD  ?  ? ?Critical care time: 42 minutes  ?Chesley Mires, MD ?[place] ?Pager - [phone] - [phone] ?05/22/2021, 4:57 PM ? ? ? ? ? ?

## 2021-05-22 NOTE — ED Notes (Signed)
Patient BIB [facility] from home, witnessed seizure by family. Patient continued seizing with EMS. Patient received 500 cc NS, 7.5 mg versed via EMS. Pt still seizing upon arrival to department.  ?

## 2021-05-22 NOTE — ED Notes (Signed)
Patient intubated @ 1541, 8.0 ett, 25 @ lip. ?

## 2021-05-22 NOTE — Progress Notes (Signed)
RT and RN transported pt to CT scan and back to ER room without complications.  ?

## 2021-05-23 ENCOUNTER — Inpatient Hospital Stay (HOSPITAL_COMMUNITY): Payer: Medicaid Other

## 2021-05-23 DIAGNOSIS — J988 Other specified respiratory disorders: Secondary | ICD-10-CM | POA: Diagnosis not present

## 2021-05-23 DIAGNOSIS — G40901 Epilepsy, unspecified, not intractable, with status epilepticus: Secondary | ICD-10-CM | POA: Diagnosis not present

## 2021-05-23 LAB — COMPREHENSIVE METABOLIC PANEL
ALT: 42 U/L (ref 0–44)
AST: 39 U/L (ref 15–41)
Albumin: 3.1 g/dL — ABNORMAL LOW (ref 3.5–5.0)
Alkaline Phosphatase: 47 U/L (ref 38–126)
Anion gap: 8 (ref 5–15)
BUN: 11 mg/dL (ref 6–20)
CO2: 18 mmol/L — ABNORMAL LOW (ref 22–32)
Calcium: 8.1 mg/dL — ABNORMAL LOW (ref 8.9–10.3)
Chloride: 109 mmol/L (ref 98–111)
Creatinine, Ser: 0.83 mg/dL (ref 0.61–1.24)
GFR, Estimated: >60 mL/min (ref >60)
Glucose, Bld: 111 mg/dL — ABNORMAL HIGH (ref 70–99)
Potassium: 3.4 mmol/L — ABNORMAL LOW (ref 3.5–5.1)
Sodium: 135 mmol/L (ref 135–145)
Total Bilirubin: 1 mg/dL (ref 0.3–1.2)
Total Protein: 5.2 g/dL — ABNORMAL LOW (ref 6.5–8.1)

## 2021-05-23 LAB — GLUCOSE, CAPILLARY
Glucose-Capillary: 136 mg/dL — ABNORMAL HIGH (ref 70–99)
Glucose-Capillary: 121 mg/dL — ABNORMAL HIGH (ref 70–99)
Glucose-Capillary: 121 mg/dL — ABNORMAL HIGH (ref 70–99)
Glucose-Capillary: 134 mg/dL — ABNORMAL HIGH (ref 70–99)
Glucose-Capillary: 135 mg/dL — ABNORMAL HIGH (ref 70–99)

## 2021-05-23 LAB — CBC
HCT: 38.9 % — ABNORMAL LOW (ref 39.0–52.0)
Hemoglobin: 14.2 g/dL (ref 13.0–17.0)
MCH: 35 pg — ABNORMAL HIGH (ref 26.0–34.0)
MCHC: 36.5 g/dL — ABNORMAL HIGH (ref 30.0–36.0)
MCV: 95.8 fL (ref 80.0–100.0)
Platelets: 138 10*3/uL — ABNORMAL LOW (ref 150–400)
RBC: 4.06 MIL/uL — ABNORMAL LOW (ref 4.22–5.81)
RDW: 11.4 % — ABNORMAL LOW (ref 11.5–15.5)
WBC: 6.8 10*3/uL (ref 4.0–10.5)
nRBC: 0 % (ref 0.0–0.2)

## 2021-05-23 LAB — TRIGLYCERIDES: Triglycerides: 62 mg/dL (ref <150)

## 2021-05-23 LAB — PHOSPHORUS: Phosphorus: 2.7 mg/dL (ref 2.5–4.6)

## 2021-05-23 LAB — MAGNESIUM: Magnesium: 2.3 mg/dL (ref 1.7–2.4)

## 2021-05-23 IMAGING — CT CT HEAD W/O CM
4 of 5 series · 15 of 47 positions shown, 17 images · non-contrast
Comparison: [DATE]

CLINICAL DATA: History of high-grade glioma of the right occipital
lobe, resected in [DATE]. Seizure disorder.



[Series 3: head without · axial · non-contrast · 0.44mm/px · z∈[-88,+17]mm · 4 of 35 slices shown]
[im 7/35  brain]
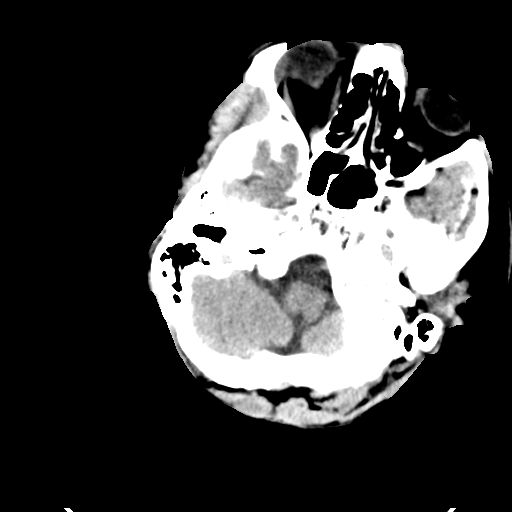
[im 14/35  brain]
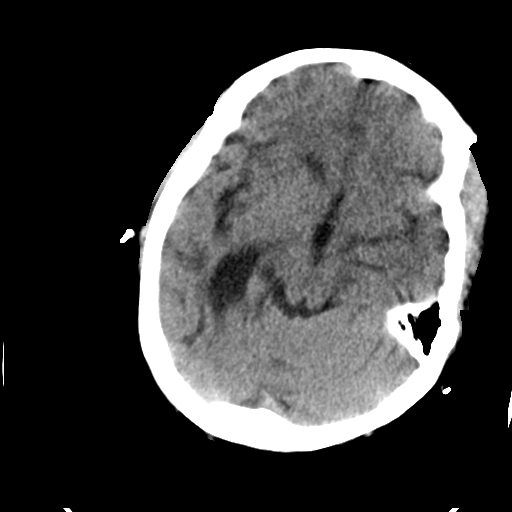
[im 21/35  brain]
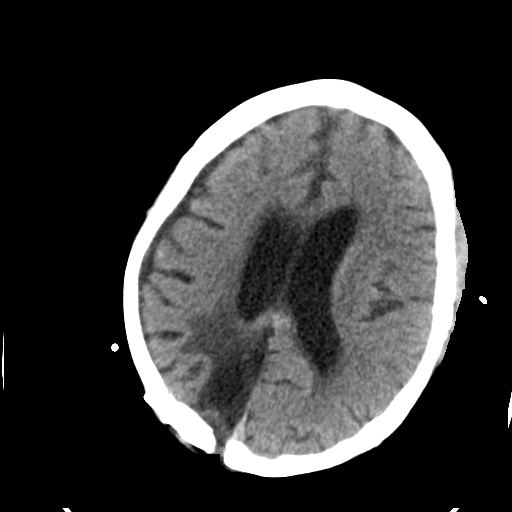
[im 28/35  brain]
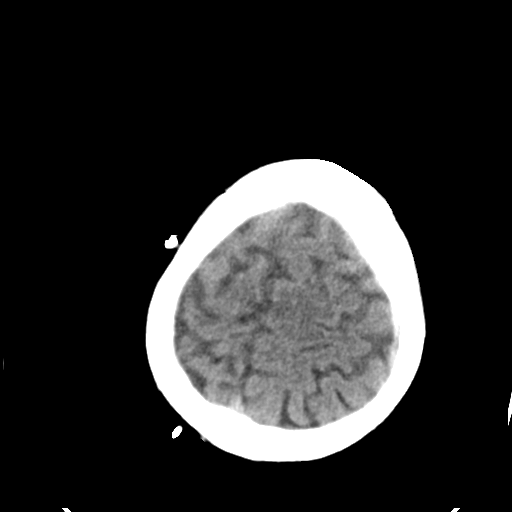

[Series 4: head without ax · axial · non-contrast · 0.44mm/px · z∈[-90,+20]mm · 5 of 35 slices shown, 7 images]
[im 6/35  brain]
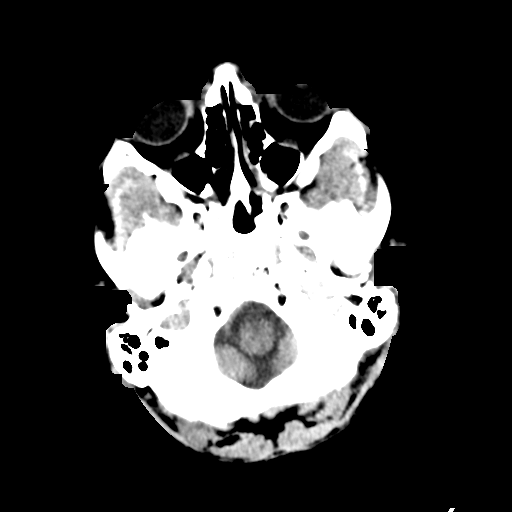
[im 6/35  bone]
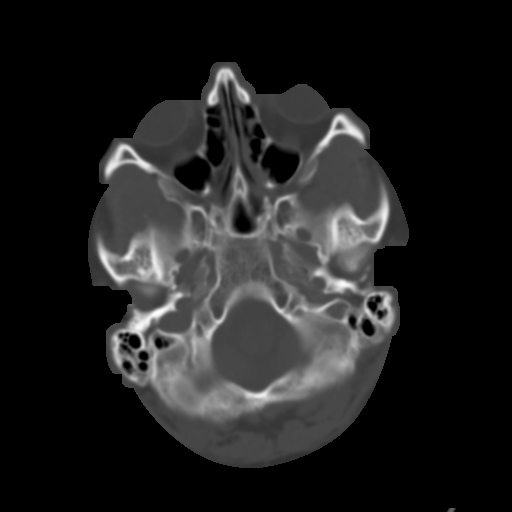
[im 12/35  brain]
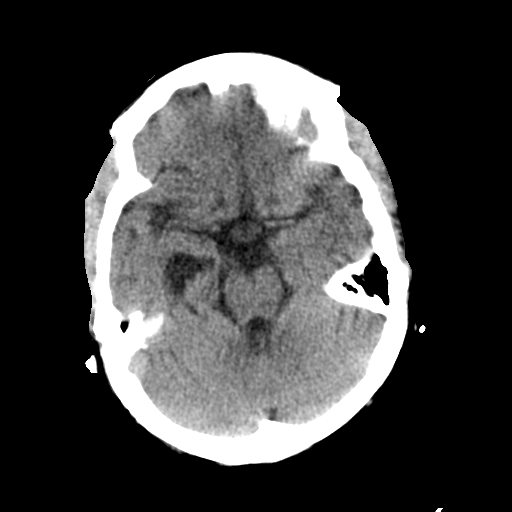
[im 18/35  brain]
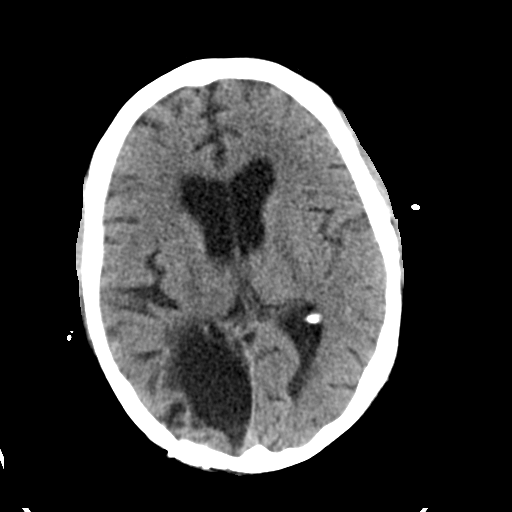
[im 23/35  brain]
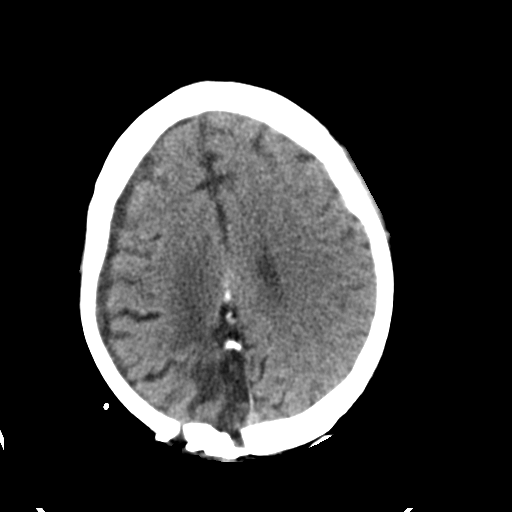
[im 29/35  brain]
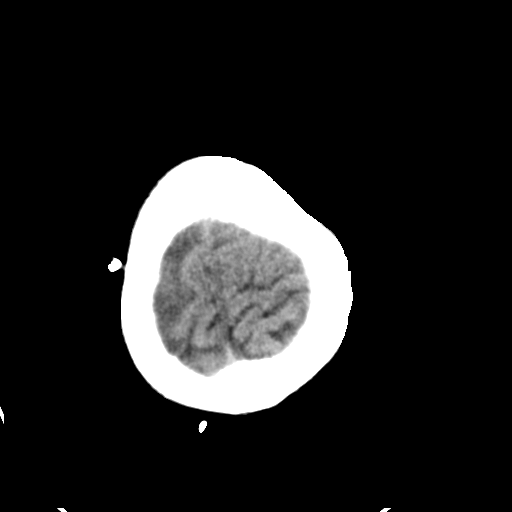
[im 29/35  bone]
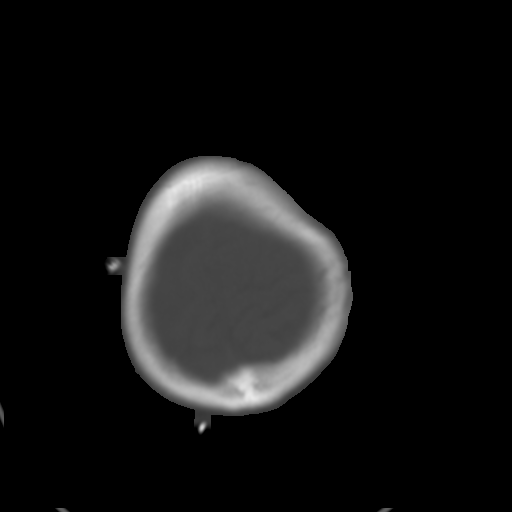

[Series 6: head without cor · coronal · non-contrast · 0.32mm/px · 3 of 66 slices shown]
[im 22/66  brain]
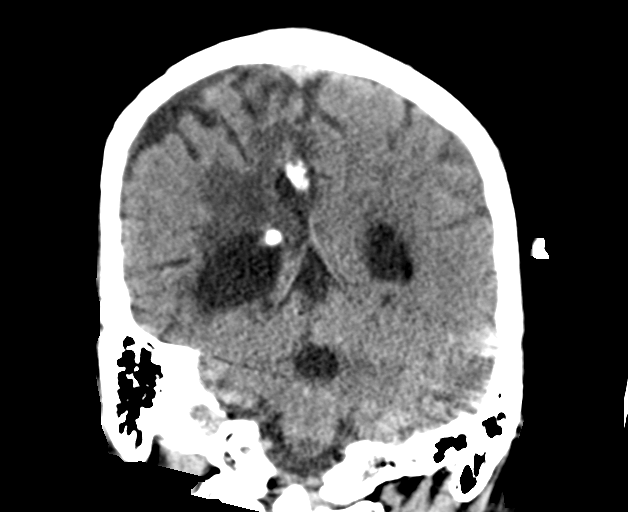
[im 29/66  brain]
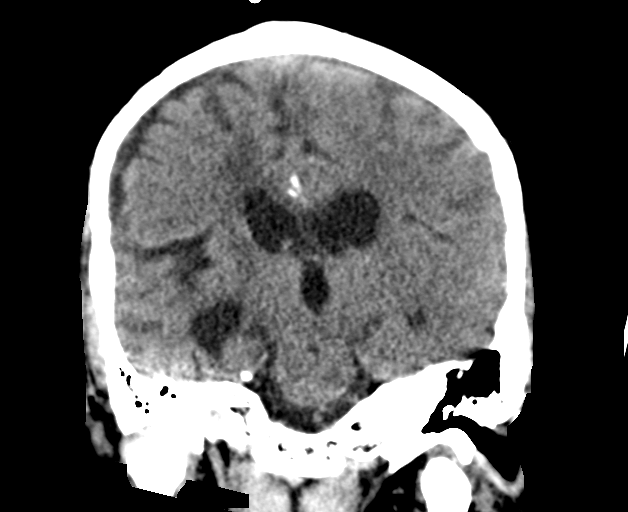
[im 37/66  brain]
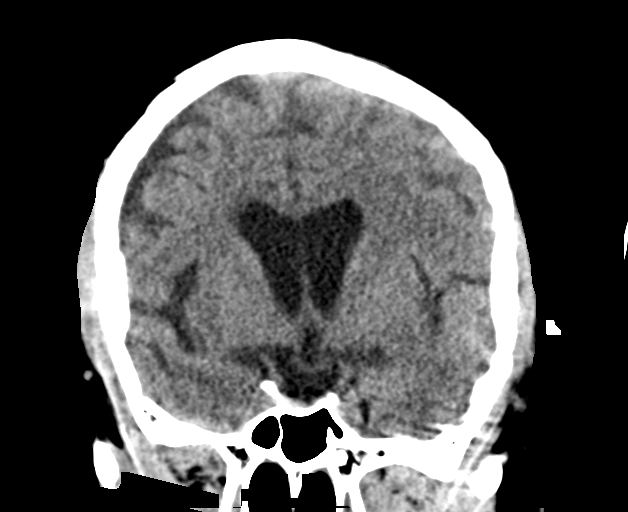

[Series 7: head without sag · sagittal · non-contrast · 0.36mm/px · 3 of 53 slices shown]
[im 18/53  brain]
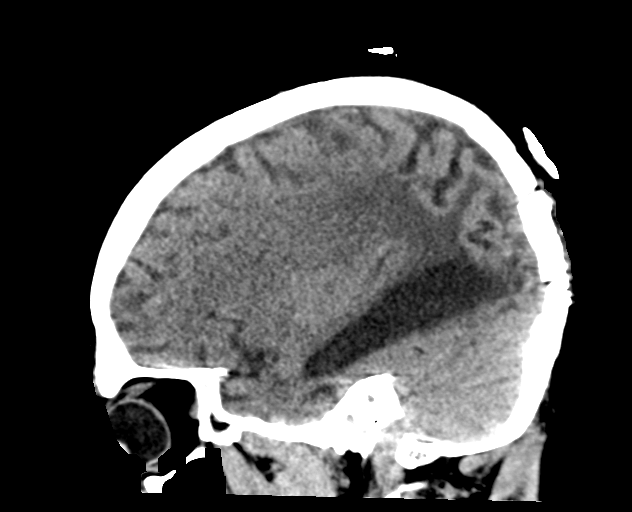
[im 27/53  brain]
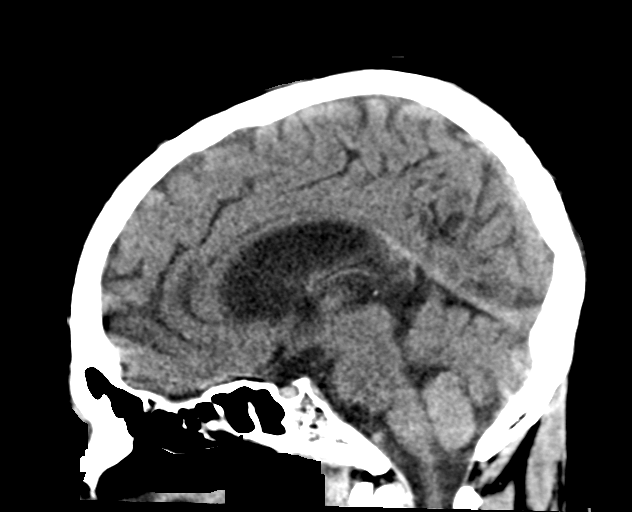
[im 35/53  brain]
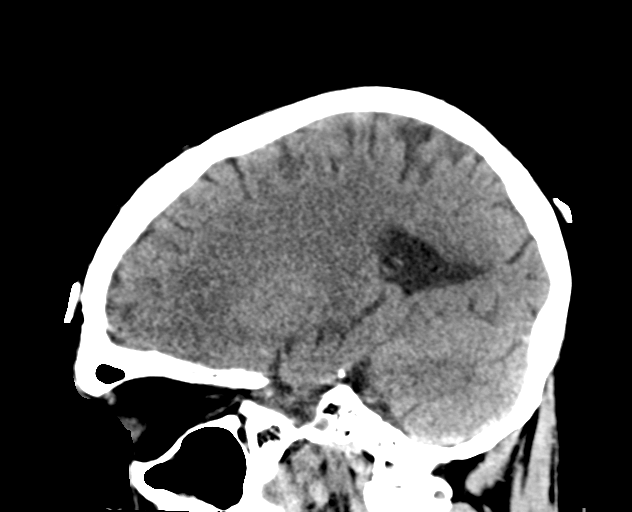

[15 of 47 positions shown; findings below may reference images not displayed]

FINDINGS: Despite efforts by the technologist and patient, motion artifact is
present on today's exam and could not be eliminated. This reduces
exam sensitivity and specificity.

Brain: Prior right occipital craniotomy and tumor resection site,
with chronic dilation of the occipital horn of the right lateral
ventricle and of the temporal horn of the right lateral ventricle.
Stable chronic prominence of low-density right extra-axial fluid
along the frontoparietal region, no high density component to
suggest acute subdural hematoma. Stable small calcifications noted
along the upper anterior resection margin. No new or acute findings.
The brainstem and cerebellum appear normal. Basilar cisterns
unremarkable.

Vascular: Unremarkable

Skull: Left occipital craniotomy.

Sinuses/Orbits: Chronic ethmoid and bilateral sphenoid sinusitis.

Other: No supplemental non-categorized findings.
IMPRESSION: 1. Chronic postoperative findings in the right occipital lobe with
stable chronic prominence of low-density right extra-axial fluid
along the frontoparietal region. No acute or interval findings.
2. Chronic ethmoid chronic bilateral sphenoid sinusitis.

## 2021-05-23 MED ORDER — POTASSIUM CHLORIDE 20 MEQ PO PACK
40.0000 meq | PACK | Freq: Once | ORAL | Status: AC
  Administered 2021-05-23: 40 meq

## 2021-05-23 MED ORDER — VITAL HIGH PROTEIN PO LIQD
1000.0000 mL | ORAL | Status: DC
  Administered 2021-05-23 (×2): 1000 mL

## 2021-05-23 MED ORDER — PROSOURCE TF PO LIQD: 45.0000 mL | Freq: Two times a day (BID) | ORAL | Status: DC

## 2021-05-23 MED ORDER — LACTATED RINGERS IV BOLUS
1000.0000 mL | Freq: Once | INTRAVENOUS | Status: AC
  Administered 2021-05-23: 1000 mL via INTRAVENOUS

## 2021-05-23 MED ORDER — VITAL HIGH PROTEIN PO LIQD: 1000.0000 mL | ORAL | Status: DC

## 2021-05-23 MED ORDER — VITAL AF 1.2 CAL PO LIQD
1000.0000 mL | ORAL | Status: DC
Start: 2021-05-23 — End: 2021-05-26
  Administered 2021-05-23 – 2021-05-24 (×2): 1000 mL

## 2021-05-23 NOTE — Progress Notes (Signed)
Late entry. LTM maint complete - no skin breakdown under: P4,f8,fp2. No skin breakdown  ? ? ?

## 2021-05-23 NOTE — Progress Notes (Signed)
[facility] CCM NP notified of l58m urine output since 0600. Orders given for LR bolus. ?

## 2021-05-23 NOTE — Progress Notes (Signed)
See below flowsheet data for pupil assessments. Dr. Rory Percy notified at 1230 of change in pupil size. Order given for stat head CT and to stop midazolam continuous infusion after CT is completed. Prior to leaving for CT at 1240 downward fixed gaze noted with pupil size changing frequently between assessments. Dr. Rory Percy notified of downward fixed gaze once RN returned to room with pt. from CT. Midazolam infusion stopped. At 1400 downward gaze noted to no longer be present. Discussed with Dr. Rory Percy, RN instructed to continue to monitor neurologic status, use propofol for sedation to ensure patient comfort, and notify neurologist if fixed downward gaze returns.  ? ? ? 05/23/21 1200 05/23/21 1230 05/23/21 1240  ?Neurological  ?Pupil Assessment  Yes Yes Yes  ?R Pupil Size (mm) '2 5 4  '$ ?R Pupil Shape Round Round Round  ?R Pupil Reaction Sluggish Sluggish Sluggish  ?L Pupil Size (mm) '2 5 4  '$ ?L Pupil Shape Round Round Round  ?L Pupil Reaction Sluggish Sluggish Sluggish  ?R Extraocular Movement 4  --  1 ?(Downward fixed gaze)  ?L Extraocular Movement 4  --  1 ?(Downward fixed gaze)  ? ? 05/23/21 1300 05/23/21 1400  ?Neurological  ?Pupil Assessment  Yes Yes  ?R Pupil Size (mm) 3 3  ?R Pupil Shape Round Round  ?R Pupil Reaction Sluggish Sluggish  ?L Pupil Size (mm) 3 3  ?L Pupil Shape Round Round  ?L Pupil Reaction Sluggish Sluggish  ?R Extraocular Movement 1 ?(Downward fixed gaze) 4  ?L Extraocular Movement 1 ?(Downward fixed gaze) 4  ? ? ?

## 2021-05-23 NOTE — Progress Notes (Addendum)
? ?NAME:  Stephen Beard, MRN:  [id number], DOB:  11/14/1995, LOS: 1 ?ADMISSION DATE:  05/22/2021, CONSULTATION DATE:  05/22/2021 ?REFERRING MD:  Dr. Jeanell Sparrow, ER, CHIEF COMPLAINT:  Seizure  ? ?History of Present Illness:  ?26 yo male smoker with hx of Rt occipital lobe oligodendroglioma s/p craniectomy, seizure disorder, ADD and substance abuse had witnessed seizure at home.  He continued to have seizure activity in ER.  Required intubation for airway protection.  Started on diprivan and then versed infusions.  Seen by neuro in ER and started on LTM.  Reported had been on binge of methamphetamine for several days prior to admission.  Uncertain if he has been compliant with medications as outpt. ? ?Hx from chart and medical team.  ? 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 ?Significant Hospital Events: ?Including procedures, antibiotic start and stop dates in addition to other pertinent events   ?3/11 Admit, neuro consulted, intubated, start LTM ?3/12 No acute issues overnight, remains on LTM with no seizure activity seen  ? ?Studies:  ?CT head 3/11 >> Rt hemisphere mild atrophy, stable ventriculomegaly with ex vacuo dilation of atrium, occipital horn, and temporal horn of Rt lateral ventricle.  Post craniotomy changes Rt parietooccipital.  Foci of dystrophic calcification at the surgical bed.  Chronic white matter hypoattenuation at posterior Lt centrum semiovale. ? ?Interim History / Subjective:  ?Deeply sedated on vent  ? ?Objective   ?Blood pressure 119/82, pulse 77, temperature 97.9 ?F (36.6 ?C), resp. rate 16, height '5\' 7"'$  (1.702 m), weight 63 kg, SpO2 100 %. ?   ?Vent Mode: PRVC ?FiO2 (%):  [40 %-100 %] 40 % ?Set Rate:  [16 bmp] 16 bmp ?Vt Set:  [520 mL] 520 mL ?PEEP:  [5 cmH20] 5 cmH20 ?Plateau Pressure:  [14 cmH20-17 cmH20] 16 cmH20  ? ?Intake/Output Summary (Last 24  hours) at 05/23/2021 0711 ?Last data filed at 05/23/2021 0600 ?Gross per 24 hour  ?Intake 1728.04 ml  ?Output 660 ml  ?Net 1068.04 ml  ? ?Filed Weights  ? 05/22/21 2115  ?Weight: 63 kg  ? ? ?Examination: ?General: Well developed adult male lying in be on mechanical ventilator in NAD ?HEENT: ETT, MM pink/moist, PERRL,  ?Neuro: Deeply sedated  ?CV: s1s2 regular rate and rhythm, no murmur, rubs, or gallops,  ?PULM:  Clear to ascultation, no increased work of breathing, tolerating vent  ?GI: soft, bowel sounds active in all 4 quadrants, non-tender, non-distended, tolerating TF ?Extremities: warm/dry, no edema  ?Skin: no rashes or lesions ? ?Resolved Hospital Problem list   ? ? ?Assessment & Plan:  ? ?Status epilepticus with hx of seizure disorder in setting of Rt occipital oligodendroglioma s/p craniotomy. ?P: ?Management per neurology  ?Maintain neuro protective measures; goal for eurothermia, euglycemia, eunatermia, normoxia, and PCO2 goal of 35-40 ?Nutrition and bowel regiment  ?Seizure precautions  ?AEDs per neurology  ?Aspirations precautions  ?Remains on LTM per neuro ?Resume outpatient Lamictal per neuro  ?Neurology plans to start weaning sedation today  ? ?Hx of Schizoaffective disorder, ADD, Depression, Polysubstance abuse. ?-UDS positive for amphetamines and benzodiazepines  ?-Home medications include buspar, zyprexa, zoloft, and trazadones  ?P: ?Hold home medications  ?May need to consider inpatient psych consult  ? ?Compromised airway. ?-Intubated on ED arrival for concern of ability to protect airway  ?P: ?Continue ventilator support with lung protective strategies  ?Wean PEEP and FiO2 for sats greater than  90%. ?Head of bed elevated 30 degrees. ?Plateau pressures less than 30 cm H20.  ?Follow intermittent chest x-ray and ABG.   ?SAT/SBT as tolerated, mentation preclude extubation  ?Ensure adequate pulmonary hygiene  ?Follow cultures  ?VAP bundle in place  ?PAD protocol ? ?Sinus tachycardia - resolved  ?-  likely from hypovolemia and possible substance abuse ?P: ?Continuous telemetry  ? ?Mild hypokalemia  ?P: ?Trend bmet  ?Supplement as needed  ? ?Best Practice (right click and "Reselect all SmartList Selections" daily)  ? ?Diet/type: tubefeeds ?DVT prophylaxis: LMWH ?GI prophylaxis: PPI ?Lines: N/A ?Foley:  N/A ?Code Status:  full code ?Last date of multidisciplinary goals of care discussion '[x]'$  ? ?Critical care time: 38 mins  ?Amonda Brillhart D. Harris, NP-C ?[facility] ?Personal contact information can be found on Amion  ?05/23/2021, 7:15 AM ? ? ? ? ? ?

## 2021-05-23 NOTE — Progress Notes (Signed)
Dr. Theda Sers notified of pupil change @ 9 pm. Pt has downward fixed gaze, pupils 4 round reactive. This RN pushed EEG button at time of noticing change. Discussed with Dr. Theda Sers, will continue monitoring neuro status. ? ?Royal Piedra, RN ?

## 2021-05-23 NOTE — Progress Notes (Addendum)
Neurology Progress Note   S:// Seen and examined. Overnight RN reports he gets very agitated off sedation Currently on propofol 50 and Versed at 10 an hour.  O:// Current vital signs: BP 102/71    Pulse 74    Temp 98.1 F (36.7 C)    Resp 16    Ht 5' 7"  (1.702 m)    Wt 63 kg    SpO2 100%    BMI 21.75 kg/m  Vital signs in last 24 hours: Temp:  [97.3 F (36.3 C)-100.4 F (38 C)] 98.1 F (36.7 C) (03/12 0700) Pulse Rate:  [62-162] 74 (03/12 0700) Resp:  [12-34] 16 (03/12 0700) BP: (97-184)/(63-126) 102/71 (03/12 0700) SpO2:  [100 %] 100 % (03/12 0700) FiO2 (%):  [40 %-100 %] 40 % (03/12 0420) Weight:  [03 kg] 63 kg (03/11 2115) Limited sedated exam General: Intubated sedated HEENT: Normocephalic atraumatic CVs: Regular rhythm Abdomen nondistended nontender Neurological exam Sedated on propofol and Versed Intubated Patient starts to get agitated by moving all extremities Does not follow any commands Cranial nerves: Pupils are equal round reactive light, no gaze preference or deviation, does not blink to threat, face appears symmetric with the tube in place. Motor examination with symmetric movement to noxious stimulation of all fours. Sensory examination: As above  Medications  Current Facility-Administered Medications:    acetaminophen (TYLENOL) tablet 650 mg, 650 mg, Per Tube, Q6H PRN, Chesley Mires, MD, 650 mg at 05/22/21 1952   chlorhexidine gluconate (MEDLINE KIT) (PERIDEX) 0.12 % solution 15 mL, 15 mL, Mouth Rinse, BID, Sood, Vineet, MD, 15 mL at 05/22/21 2041   Chlorhexidine Gluconate Cloth 2 % PADS 6 each, 6 each, Topical, Q0600, Chesley Mires, MD, 6 each at 05/23/21 0628   dextrose 5 %-0.9 % sodium chloride infusion, , Intravenous, Continuous, Kamat, Sunil G, MD, Last Rate: 100 mL/hr at 05/23/21 0600, Infusion Verify at 05/23/21 0600   docusate (COLACE) 50 MG/5ML liquid 100 mg, 100 mg, Per Tube, BID, Halford Chessman, Vineet, MD, 100 mg at 05/22/21 2146   enoxaparin (LOVENOX)  injection 40 mg, 40 mg, Subcutaneous, Q24H, Sood, Vineet, MD, 40 mg at 05/22/21 1804   etomidate (AMIDATE) injection, , Intravenous, PRN, Pattricia Boss, MD, 20 mg at 05/22/21 1539   feeding supplement (VITAL HIGH PROTEIN) liquid 1,000 mL, 1,000 mL, Per Tube, Q24H, Sood, Vineet, MD, 1,000 mL at 05/22/21 1800   fentaNYL (SUBLIMAZE) injection 50-200 mcg, 50-200 mcg, Intravenous, Q30 min PRN, Halford Chessman, Vineet, MD   levETIRAcetam (KEPPRA) IVPB 500 mg/100 mL premix, 500 mg, Intravenous, Q12H, Amie Portland, MD, Stopped at 05/23/21 0400   MEDLINE mouth rinse, 15 mL, Mouth Rinse, 10 times per day, Chesley Mires, MD, 15 mL at 05/23/21 8882   midazolam (VERSED) 100 mg/100 mL (1 mg/mL) premix infusion, 0.5-10 mg/hr, Intravenous, Continuous, Amie Portland, MD, Last Rate: 10 mL/hr at 05/23/21 0000, 10 mg/hr at 05/23/21 0000   midazolam (VERSED) injection 2 mg, 2 mg, Intravenous, PRN, Chesley Mires, MD   pantoprazole sodium (PROTONIX) 40 mg/20 mL oral suspension 40 mg, 40 mg, Per Tube, Daily, Sood, Vineet, MD, 40 mg at 05/22/21 1804   polyethylene glycol (MIRALAX / GLYCOLAX) packet 17 g, 17 g, Per Tube, Daily, Sood, Vineet, MD   propofol (DIPRIVAN) 1000 MG/100ML infusion, 5-80 mcg/kg/min, Intravenous, Titrated, Amie Portland, MD, Last Rate: 18.72 mL/hr at 05/23/21 0700, 50 mcg/kg/min at 05/23/21 0700   succinylcholine (ANECTINE) injection, , Intravenous, PRN, Pattricia Boss, MD, 120 mg at 05/22/21 1539 Labs CBC    Component Value Date/Time  WBC 6.8 05/23/2021 0158   RBC 4.06 (L) 05/23/2021 0158   HGB 14.2 05/23/2021 0158   HGB 17.0 05/17/2021 1021   HCT 38.9 (L) 05/23/2021 0158   PLT 138 (L) 05/23/2021 0158   PLT 160 05/17/2021 1021   MCV 95.8 05/23/2021 0158   MCH 35.0 (H) 05/23/2021 0158   MCHC 36.5 (H) 05/23/2021 0158   RDW 11.4 (L) 05/23/2021 0158   LYMPHSABS 1.2 05/17/2021 1021   MONOABS 0.4 05/17/2021 1021   EOSABS 0.0 05/17/2021 1021   BASOSABS 0.0 05/17/2021 1021    CMP     Component Value  Date/Time   NA 135 05/23/2021 0158   K 3.4 (L) 05/23/2021 0158   CL 109 05/23/2021 0158   CO2 18 (L) 05/23/2021 0158   GLUCOSE 111 (H) 05/23/2021 0158   BUN 11 05/23/2021 0158   CREATININE 0.83 05/23/2021 0158   CREATININE 0.95 05/17/2021 1021   CALCIUM 8.1 (L) 05/23/2021 0158   PROT 5.2 (L) 05/23/2021 0158   ALBUMIN 3.1 (L) 05/23/2021 0158   AST 39 05/23/2021 0158   AST 37 05/17/2021 1021   ALT 42 05/23/2021 0158   ALT 48 (H) 05/17/2021 1021   ALKPHOS 47 05/23/2021 0158   BILITOT 1.0 05/23/2021 0158   BILITOT 1.1 05/17/2021 1021   GFRNONAA >60 05/23/2021 0158   GFRNONAA >60 05/17/2021 1021   GFRAA >60 12/05/2019 1206   Spot EEG with excessive muscle artifact-no seizures  Imaging I have reviewed images in epic and the results pertinent to this consultation are: CT head IMPRESSION: Post craniotomy changes RIGHT parieto-occipital region.  Stable ventricular morphology with ex vacuo dilatation of the RIGHT lateral ventricle.  Chronic white matter hypoattenuation at posterior LEFT centrum semiovale.  No acute intracranial abnormalities . MRI brain with and without contrast 05/14/2021 IMPRESSION: 1. Small focus of increased enhancement along the anterior superior resection cavity since last year (series 20, image 91), although partially contiguous with the choroid plexus. Appearance is indeterminate at this time with otherwise unchanged MRI appearance of the treatment area. Recommend continued MRI surveillance. 2. No new intracranial abnormality.  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  Extremely tachycardic on site requiring cardioversion-no loss of pulses.  Remains extremely encephalopathic and unable to protect his  airway on presentation and was intubated emergently in the ER.   Concern for breakthrough seizures and status epilepticus in the setting of drug abuse and toxic metabolic encephalopathy from drug use.  Remains stable on sedation with Versed and propofol overnight. Spot EEG unremarkable, hooked up to LTM EEG.  Report pending  Recommendations: -Await continuous EEG report-we will continue continuous EEG till we are able to wean sedation completely. -Can start weaning off Versed drip by 1 mg/h with a goal to discontinue towards the end of the evening. -Continue propofol and can go up if needed if pressure tolerates. -Once stabilized, needs MRI of the brain with and without contrast to evaluate for tumor recurrence. -Management of autonomic instability likely related to methamphetamine abuse per primary team as you are -Maintain seizure precautions Plan discussed with patient's bedside RN and the PCCM attending Dr. Halford Chessman, on the unit.    Addendum Overnight EEG ABNORMALITY - Continuous slow, generalized - Excessive beta, generalized IMPRESSION: This technically difficult study is suggestive of  moderate to severe diffuse encephalopathy, nonspecific etiology but most likely secondary to sedation. No seizures or epileptiform discharges were seen throughout the recording.   One event button was recorded on 05/22/2021  at 2039 for right arm twitching without concomitant EEG change. This episodes was most likely NOT epileptic.  Recommendations -continue to wean versed  -continue LTM -rest as above   -- Amie Portland, MD Neurologist [facility] Pager: [place] Performed by: Amie Portland, MD Total critical care time: 30 minutes Critical care time was exclusive of separately billable procedures and treating other patients and/or supervising APPs/Residents/Students Critical care was necessary to treat or prevent imminent or life-threatening  deterioration due to breakthrough seizure, status epilepticus This patient is critically ill and at significant risk for neurological worsening and/or death and care requires constant monitoring. Critical care was time spent personally by me on the following activities: development of treatment plan with patient and/or surrogate as well as nursing, discussions with consultants, evaluation of patient's response to treatment, examination of patient, obtaining history from patient or surrogate, ordering and performing treatments and interventions, ordering and review of laboratory studies, ordering and review of radiographic studies, pulse oximetry, re-evaluation of patient's condition, participation in multidisciplinary rounds and medical decision making of high complexity in the care of this patient.

## 2021-05-23 NOTE — Progress Notes (Signed)
[facility] ADULT ICU REPLACEMENT PROTOCOL ? ? ?The patient does apply for the [facility] Adult ICU Electrolyte Replacment Protocol based on the criteria listed below:  ? ?1.Exclusion criteria: TCTS patients, ECMO patients, and Dialysis patients ?2. Is GFR >/= 30 ml/min? Yes.    ?Patient's GFR today is >60 ?3. Is SCr </= 2? Yes.   ?Patient's SCr is 0.83 mg/dL ?4. Did SCr increase >/= 0.5 in 24 hours? No. ?5.Pt's weight >40kg  Yes.   ?6. Abnormal electrolyte(s): K+3.4  ?7. Electrolytes replaced per protocol ?8.  Call MD STAT for K+ </= 2.5, Phos </= 1, or Mag </= 1 ?Physician:  Jeannene Patella ? ?Carlisle Beers 05/23/2021 5:12 AM  ?

## 2021-05-23 NOTE — Progress Notes (Signed)
Brief Nutrition Note ? ?Consult received for enteral/tube feeding initiation and management. ? ?Adult Enteral Nutrition Protocol initiated on admission. Slight adjustments made: changed from Vital High Protein to Vital AF at 54m/h to provide additional kcal. Pt currently receiving propofol at 18.769mh (495 kcal/d). Full assessment to follow. ? ?Admitting Dx: Respiratory failure ([facility][J96.90] ?Status epilepticus ([facility][G[S06.301][facility]D49[facility]] ? ?Body mass index is 21.75 kg/m?. Marland Kitchent meets criteria for normal based on current BMI. ? ?Labs: ?Recent Labs  ?Lab 05/17/21 ?1021 05/22/21 ?1543 05/22/21 ?1545 05/22/21 ?1616 05/23/21 ?0158  ?NA 135 136 135 133* 135  ?K 3.9 4.4 4.5 4.4 3.4*  ?CL 101 98 106  --  109  ?CO2 26 <7*  --   --  18*  ?BUN '18 15 16  '$ --  11  ?CREATININE 0.95 1.42* 1.10  --  0.83  ?CALCIUM 9.8 9.5  --   --  8.1*  ?MG  --   --   --   --  2.3  ?PHOS  --   --   --   --  2.7  ?GLUCOSE 98 116* 112*  --  111*  ? ? ?RaRanell PatrickRD, LDN ?Clinical Dietitian ?RD pager # available in [place]?After hours/weekend pager # available in [facility]

## 2021-05-23 NOTE — Progress Notes (Signed)
Patient transported on vent to CT and returned to 5Q30 without complications. ?

## 2021-05-23 NOTE — Procedures (Addendum)
Patient Name: Stephen Beard  ?MRN: [id number]  ?Epilepsy Attending: Lora Havens  ?Referring Physician/Provider: Amie Portland, M ?Duration: 05/22/2021 1735 to 05/23/2021 1735 ?  ?Patient history: 26yo m presented with seizure like episodes. EEG to evaluate for seizure ?  ?Level of alertness: lethargic  ?  ?AEDs during EEG study: LEV, propofol, versed ?  ?Technical aspects: This EEG study was done with scalp electrodes positioned according to the 10-20 International system of electrode placement. Electrical activity was acquired at a sampling rate of '500Hz'$  and reviewed with a high frequency filter of '70Hz'$  and a low frequency filter of '1Hz'$ . EEG data were recorded continuously and digitally stored.  ?  ?Description: EEG showed continuous generalized 3 to 6 Hz theta-delta slowing admixed with an excessive amount of 15 to 18 Hz beta activity distributed symmetrically and diffusely.  ?  ?Patient event button was pressed on 05/22/2021  at 2039 for right arm twitching. Concomitant EEG before, during and after the event did not show any EEG changes suggest seizure. ?  ?Hyperventilation and photic stimulation were not performed.    ?  ?ABNORMALITY ?- Continuous slow, generalized ?- Excessive beta, generalized ?  ?IMPRESSION: ?This technically difficult study is suggestive of moderate to severe diffuse encephalopathy, nonspecific etiology but most likely secondary to sedation. No seizures or epileptiform discharges were seen throughout the recording. ?  ?One event was recorded on 05/22/2021  at 2039 for right arm twitching without concomitant EEG change. This episode was most likely NOT epileptic. ?  ?Lora Havens  ?  ?

## 2021-05-24 DIAGNOSIS — F1910 Other psychoactive substance abuse, uncomplicated: Secondary | ICD-10-CM

## 2021-05-24 DIAGNOSIS — J9601 Acute respiratory failure with hypoxia: Secondary | ICD-10-CM | POA: Diagnosis not present

## 2021-05-24 DIAGNOSIS — D496 Neoplasm of unspecified behavior of brain: Secondary | ICD-10-CM | POA: Diagnosis not present

## 2021-05-24 DIAGNOSIS — G40901 Epilepsy, unspecified, not intractable, with status epilepticus: Secondary | ICD-10-CM | POA: Diagnosis not present

## 2021-05-24 LAB — CBC
HCT: 39.1 % (ref 39.0–52.0)
Hemoglobin: 14.2 g/dL (ref 13.0–17.0)
MCH: 35.2 pg — ABNORMAL HIGH (ref 26.0–34.0)
MCHC: 36.3 g/dL — ABNORMAL HIGH (ref 30.0–36.0)
MCV: 97 fL (ref 80.0–100.0)
Platelets: 124 10*3/uL — ABNORMAL LOW (ref 150–400)
RBC: 4.03 MIL/uL — ABNORMAL LOW (ref 4.22–5.81)
RDW: 11.9 % (ref 11.5–15.5)
WBC: 6.8 10*3/uL (ref 4.0–10.5)
nRBC: 0 % (ref 0.0–0.2)

## 2021-05-24 LAB — BASIC METABOLIC PANEL
Anion gap: 8 (ref 5–15)
BUN: <5 mg/dL — ABNORMAL LOW (ref 6–20)
CO2: 21 mmol/L — ABNORMAL LOW (ref 22–32)
Calcium: 8.3 mg/dL — ABNORMAL LOW (ref 8.9–10.3)
Chloride: 111 mmol/L (ref 98–111)
Creatinine, Ser: 0.72 mg/dL (ref 0.61–1.24)
GFR, Estimated: >60 mL/min (ref >60)
Glucose, Bld: 115 mg/dL — ABNORMAL HIGH (ref 70–99)
Potassium: 2.9 mmol/L — ABNORMAL LOW (ref 3.5–5.1)
Sodium: 140 mmol/L (ref 135–145)

## 2021-05-24 LAB — LAMOTRIGINE LEVEL: Lamotrigine Lvl: 3.5 ug/mL (ref 2.0–20.0)

## 2021-05-24 LAB — GLUCOSE, CAPILLARY
Glucose-Capillary: 119 mg/dL — ABNORMAL HIGH (ref 70–99)
Glucose-Capillary: 123 mg/dL — ABNORMAL HIGH (ref 70–99)
Glucose-Capillary: 134 mg/dL — ABNORMAL HIGH (ref 70–99)
Glucose-Capillary: 135 mg/dL — ABNORMAL HIGH (ref 70–99)
Glucose-Capillary: 136 mg/dL — ABNORMAL HIGH (ref 70–99)
Glucose-Capillary: 136 mg/dL — ABNORMAL HIGH (ref 70–99)
Glucose-Capillary: 140 mg/dL — ABNORMAL HIGH (ref 70–99)

## 2021-05-24 LAB — PHOSPHORUS: Phosphorus: 2.9 mg/dL (ref 2.5–4.6)

## 2021-05-24 LAB — MAGNESIUM: Magnesium: 1.8 mg/dL (ref 1.7–2.4)

## 2021-05-24 MED ORDER — DEXMEDETOMIDINE HCL IN NACL 400 MCG/100ML IV SOLN
0.4000 ug/kg/h | INTRAVENOUS | Status: DC
  Administered 2021-05-24: 1 ug/kg/h via INTRAVENOUS
  Administered 2021-05-24: 0.4 ug/kg/h via INTRAVENOUS
  Administered 2021-05-25: 0.9 ug/kg/h via INTRAVENOUS
  Administered 2021-05-25: 1 ug/kg/h via INTRAVENOUS
  Filled 2021-05-24 (×4): qty 100

## 2021-05-24 MED ORDER — LAMOTRIGINE 25 MG PO TABS
250.0000 mg | ORAL_TABLET | Freq: Two times a day (BID) | ORAL | Status: DC
  Administered 2021-05-24 (×2): 250 mg
  Filled 2021-05-24 (×2): qty 2

## 2021-05-24 MED ORDER — LORAZEPAM 2 MG/ML IJ SOLN: 4.0000 mg | Freq: Once | INTRAMUSCULAR | Status: AC

## 2021-05-24 MED ORDER — POTASSIUM CHLORIDE 10 MEQ/100ML IV SOLN
10.0000 meq | INTRAVENOUS | Status: AC
  Administered 2021-05-24 (×4): 10 meq via INTRAVENOUS
  Filled 2021-05-24 (×4): qty 100

## 2021-05-24 MED ORDER — POTASSIUM CHLORIDE 10 MEQ/50ML IV SOLN: 10.0000 meq | INTRAVENOUS | Status: DC

## 2021-05-24 MED ORDER — LORAZEPAM 2 MG/ML IJ SOLN
INTRAMUSCULAR | Status: AC
  Administered 2021-05-24: 4 mg via INTRAVENOUS
  Filled 2021-05-24: qty 1

## 2021-05-24 MED ORDER — LORAZEPAM 2 MG/ML IJ SOLN
INTRAMUSCULAR | Status: AC
  Filled 2021-05-24: qty 1

## 2021-05-24 MED ORDER — LORAZEPAM 2 MG/ML IJ SOLN
INTRAMUSCULAR | Status: AC
  Administered 2021-05-24: 4 mg via INTRAVENOUS
  Filled 2021-05-24: qty 2

## 2021-05-24 MED ORDER — LACTATED RINGERS IV SOLN: INTRAVENOUS | Status: DC

## 2021-05-24 MED ORDER — MAGNESIUM SULFATE 2 GM/50ML IV SOLN
2.0000 g | Freq: Once | INTRAVENOUS | Status: AC
  Administered 2021-05-24: 2 g via INTRAVENOUS
  Filled 2021-05-24: qty 50

## 2021-05-24 MED ORDER — POTASSIUM CHLORIDE 20 MEQ PO PACK
40.0000 meq | PACK | Freq: Once | ORAL | Status: AC
  Administered 2021-05-24: 40 meq
  Filled 2021-05-24: qty 2

## 2021-05-24 NOTE — Progress Notes (Signed)
0845: Notified Dr. Tacy Learn regarding acute onset full body twitching with eyes looking down and to the left. Pt. Also tachycardic in 140-150s.  ?0865: '4mg'$  Ativan given per Dr. Tacy Learn. Propofol increased to 80.  ?0855: Seizure-like symptoms return (see assessment below), additional '4mg'$  of Ativan given per Dr. Tacy Learn.  ?0900: Symptoms resolve ? ? 05/24/21 0855  ?Neurological  ?Level of Consciousness Responds to Pain  ?Orientation Level Intubated/Tracheostomy - Unable to assess  ?Cognition Unable to follow commands  ?Speech Intubated/Tracheostomy - Unable to assess  ?R Pupil Size (mm) 4  ?R Pupil Shape Round  ?R Pupil Reaction Sluggish  ?L Pupil Size (mm) 4  ?L Pupil Shape Round  ?L Pupil Reaction Sluggish  ?Additional Pupil Assessments Extraocular Movement  ?R Extraocular Movement 1 ?(Down and leftward)  ?L Extraocular Movement 1 ?(Down and leftward)  ?Motor Function/Sensation Assessment Motor response  ?RUE Motor Response Non-purposeful movement  ?RUE Motor Strength 2  ?LUE Motor Response Non-purposeful movement  ?LUE Motor Strength 2  ?RLE Motor Response Non-purposeful movement  ?RLE Motor Strength 2  ?LLE Motor Response Non-purposeful movement  ?LLE Motor Strength 2  ?Neuro Symptoms Seizure;Tremors  ?Seizure Activity  ?Psychomotor Symptoms None  ?Deviation Other (Comment) ?(Eyes down and left)  ?Motor Component Generalized;Twitching  ?Interventions Notified MD/Provider;Medication  ? ? ?

## 2021-05-24 NOTE — Progress Notes (Signed)
I was called with patient ?Potassium became tachycardic and had generalized body shaking and tremors.  During my evaluation patient continued to have whole body shaking and rigidity, he had forced left gaze deviation with hippus. ?He was tachypneic, tachycardic and hypertensive ? ?Patient is on EEG, there were a lot of artifacts so could not get sense if patient is having seizures but looking at his clinical picture looked like he was having generalized tonic-clonic seizures ?He was given 4 mg of IV Ativan with improvement in left gaze to midline then he had some downward gaze deviation with reactive bilateral pupils, after few minutes he again had generalized tonic-clonic seizure with left gaze deviation, he received 4 mg of Ativan again.  He is on Keppra ?Propofol infusion was increased to 80 mics from 50, patient heart rate is trending down, is not tachypneic anymore, his gaze is downward and midline. ?Neurology is paged for further evaluation and EEG read ? ? ? ?Additional critical care time: 37 minutes ? ?Performed by: Jacky Kindle ?  ?Critical care time was exclusive of separately billable procedures and treating other patients. ?  ?Critical care was necessary to treat or prevent imminent or life-threatening deterioration. ?  ?Critical care was time spent personally by me on the following activities: development of treatment plan with patient and/or surrogate as well as nursing, discussions with consultants, evaluation of patient's response to treatment, examination of patient, obtaining history from patient or surrogate, ordering and performing treatments and interventions, ordering and review of laboratory studies, ordering and review of radiographic studies, pulse oximetry and re-evaluation of patient's condition. ?  ?Jacky Kindle MD ?[facility] ?See Amion for pager ?If no response to pager, please call [phone] until 7pm ?After 7pm, Please call E-link [phone] ? ?

## 2021-05-24 NOTE — Progress Notes (Signed)
? ?NAME:  Stephen Beard, MRN:  [id number], DOB:  1995/11/25, LOS: 2 ?ADMISSION DATE:  05/22/2021, CONSULTATION DATE:  05/22/2021 ?REFERRING MD:  Dr. Jeanell Sparrow, ER, CHIEF COMPLAINT:  Seizure  ? ?History of Present Illness:  ?26 yo male smoker with hx of Rt occipital lobe oligodendroglioma s/p craniectomy, seizure disorder, ADD and substance abuse had witnessed seizure at home.  He continued to have seizure activity in ER.  Required intubation for airway protection.  Started on diprivan and then versed infusions.  Seen by neuro in ER and started on LTM.  Reported had been on binge of methamphetamine for several days prior to admission.  Uncertain if he has been compliant with medications as outpt. ?Hx from chart and medical team.  ? 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 ?Significant Hospital Events: ?Including procedures, antibiotic start and stop dates in addition to other pertinent events   ?3/11 Admit, neuro consulted, intubated, start LTM ?3/12 noted to have change in pupil size and gaze deviation. CT Head obtained without acute abnormalities. Sedation changed from versed to propofol ? ?Studies:  ?CT head 3/11 >> Rt hemisphere mild atrophy, stable ventriculomegaly with ex vacuo dilation of atrium, occipital horn, and temporal horn of Rt lateral ventricle.  Post craniotomy changes Rt parietooccipital.  Foci of dystrophic calcification at the surgical bed.  Chronic white matter hypoattenuation at posterior Lt centrum semiovale. ?CT Head 3/12 >> Chronic postoperative findings in the right occipital lobe with ?stable chronic prominence of low-density right extra-axial fluid ?along the frontoparietal region. No acute or interval findings. ? ?Interim History / Subjective:  ?Patient remains intubated and on propofol. Noted to have generalized tremors and rigidity with left gaze  deviation this AM, suspected to be seizure for which he was given Ativan with improvement.  ? ?Objective   ?Blood pressure 101/66, pulse 68, temperature (!) 101.3 ?F (38.5 ?C), temperature source Axillary, resp. rate 16, height '5\' 7"'$  (1.702 m), weight 63 kg, SpO2 100 %. ?   ?Vent Mode: PRVC ?FiO2 (%):  [40 %] 40 % ?Set Rate:  [16 bmp] 16 bmp ?Vt Set:  [520 mL] 520 mL ?PEEP:  [5 cmH20] 5 cmH20 ?Plateau Pressure:  [15 cmH20-17 cmH20] 17 cmH20  ? ?Intake/Output Summary (Last 24 hours) at 05/24/2021 0752 ?Last data filed at 05/24/2021 0600 ?Gross per 24 hour  ?Intake 3347.47 ml  ?Output 2355 ml  ?Net 992.47 ml  ? ?Filed Weights  ? 05/22/21 2115  ?Weight: 63 kg  ? ? ?Examination: ? ?General - sedated, intubated, withdraws to painful stimuli ?Eyes - pupils reactive to light; left gaze deviation  ?ENT - ETT in place ?Cardiac - regular rate/rhythm, no murmur ?Chest - bibasilar crackles; remains on full vent support FiO2 40%, PEEP 5 ?Abdomen - soft, non tender, + bowel sounds ?Extremities - decreased muscle bulk; no cyanosis, clubbing, or edema ?Skin - no rashes ? ?Resolved Hospital Problem list   ?Sinus tachycardia  ?Assessment & Plan:  ? ?Status epilepticus with hx of seizure disorder in setting of Rt occipital oligodendroglioma s/p craniotomy. ?Seizure activity this morning that resolved with Ativan ?- neurology consulted ?- continue keppra, resuming home lamictal ?- Wean propofol, will add precedex for goal RASS -2 to -3 ? ?Hx of Schizoaffective disorder, ADD, Depression, Polysubstance abuse. ?- hold outpt bupsar, zyprexa, zoloft, trazodone for now ? ?Acute hypoxic resp failure 2/2 status epilepticus  ?- continue full vent support ?-  monitor for signs of aspiration; defer antibiotics for now ? ?Thrombocytopenia ?Likely in 2/2 acute illness.  ?- Trend CBC ? ?Hypokalemia ?NAGMA ?- Replete ?- Trend BMP ? ?Best Practice (right click and "Reselect all SmartList Selections" daily)  ? ?Diet/type: tubefeeds ?DVT prophylaxis:  LMWH ?GI prophylaxis: PPI ?Lines: N/A ?Foley:  N/A ?Code Status:  full code ?Last date of multidisciplinary goals of care discussion [3/13 discussed with Neurology and family at bedside] ? ?Labs   ?CBC: ?Recent Labs  ?Lab 05/17/21 ?1021 05/22/21 ?1543 05/22/21 ?1545 05/22/21 ?1616 05/23/21 ?0158 05/24/21 ?5638  ?WBC 3.8* 10.5  --   --  6.8 6.8  ?NEUTROABS 2.2  --   --   --   --   --   ?HGB 17.0 18.4* 18.7* 16.7 14.2 14.2  ?HCT 46.1 54.9* 55.0* 49.0 38.9* 39.1  ?MCV 94.7 104.8*  --   --  95.8 97.0  ?PLT 160 241  --   --  138* 124*  ? ? ?Basic Metabolic Panel: ?Recent Labs  ?Lab 05/17/21 ?1021 05/22/21 ?1543 05/22/21 ?1545 05/22/21 ?1616 05/23/21 ?0158  ?NA 135 136 135 133* 135  ?K 3.9 4.4 4.5 4.4 3.4*  ?CL 101 98 106  --  109  ?CO2 26 <7*  --   --  18*  ?GLUCOSE 98 116* 112*  --  111*  ?BUN '18 15 16  '$ --  11  ?CREATININE 0.95 1.42* 1.10  --  0.83  ?CALCIUM 9.8 9.5  --   --  8.1*  ?MG  --   --   --   --  2.3  ?PHOS  --   --   --   --  2.7  ? ?GFR: ?Estimated Creatinine Clearance: 120.2 mL/min (by C-G formula based on SCr of 0.83 mg/dL). ?Recent Labs  ?Lab 05/17/21 ?1021 05/22/21 ?1543 05/23/21 ?0158 05/24/21 ?9373  ?WBC 3.8* 10.5 6.8 6.8  ? ? ?Liver Function Tests: ?Recent Labs  ?Lab 05/17/21 ?1021 05/22/21 ?1543 05/23/21 ?0158  ?AST 37 94* 39  ?ALT 48* 69* 42  ?ALKPHOS 65 75 47  ?BILITOT 1.1 1.4* 1.0  ?PROT 7.3 7.5 5.2*  ?ALBUMIN 4.7 4.6 3.1*  ? ?No results for input(s): LIPASE, AMYLASE in the last 168 hours. ?No results for input(s): AMMONIA in the last 168 hours. ? ?ABG ?   ?Component Value Date/Time  ? PHART 7.219 (L) 05/22/2021 1616  ? PCO2ART 33.7 05/22/2021 1616  ? PO2ART 352 (H) 05/22/2021 1616  ? HCO3 13.8 (L) 05/22/2021 1616  ? TCO2 15 (L) 05/22/2021 1616  ? ACIDBASEDEF 13.0 (H) 05/22/2021 1616  ? O2SAT 100 05/22/2021 1616  ?  ? ?Coagulation Profile: ?No results for input(s): INR, PROTIME in the last 168 hours. ? ?Cardiac Enzymes: ?No results for input(s): CKTOTAL, CKMB, CKMBINDEX, TROPONINI in the last 168  hours. ? ?HbA1C: ?No results found for: HGBA1C ? ?CBG: ?Recent Labs  ?Lab 05/23/21 ?1549 05/23/21 ?2053 05/24/21 ?0021 05/24/21 ?4287 05/24/21 ?0732  ?GLUCAP 134* 135* 134* 119* 140*  ? ? ?Critical care time: 35 minutes  ? ? ?

## 2021-05-24 NOTE — Progress Notes (Signed)
Pharmacy Electrolyte Replacement ? ?Recent Labs: ? ?Recent Labs  ?  05/24/21 ?0707  ?K 2.9*  ?MG 1.8  ?PHOS 2.9  ?CREATININE 0.72  ? ? ?Low Critical Values (K </= 2.5, Phos </= 1, Mg </= 1) Present: ?None ? ?MD Contacted: none - no critical values noted ? ?Plan: Mag sulfate 4g IV x 1 ?KCl 68mq IV x 4 + KCl 443m PT x 1 ?Recheck electrolytes with AM labs ? ? ?HaArturo MortonPharmD, BCPS ?Please check AMION for all [facility]act numbers ?Clinical Pharmacist ?05/24/2021 9:19 AM ? ?

## 2021-05-24 NOTE — Procedures (Addendum)
Patient Name: Stephen Beard  MRN: [id number]  Epilepsy Attending: Lora Havens  Referring Physician/Provider: Amie Portland, M Duration: 05/23/2021 1735 to 05/24/2021 0915   Patient history: 26yo m presented with seizure like episodes. EEG to evaluate for seizure   Level of alertness: lethargic    AEDs during EEG study: LEV, propofol   Technical aspects: This EEG study was done with scalp electrodes positioned according to the 10-20 International system of electrode placement. Electrical activity was acquired at a sampling rate of '500Hz'$  and reviewed with a high frequency filter of '70Hz'$  and a low frequency filter of '1Hz'$ . EEG data were recorded continuously and digitally stored.    Description: EEG showed continuous generalized 6-9 hz theta-alpha activity admixed with intermittent generalized 2-'3hz'$  delta slowing.   Patient event button was pressed on 05/23/2021 at 2056 for downward gaze deviation. Concomitant EEG before, during and after the event did not show any EEG changes suggest seizure.  Patient event button was pressed on 05/24/2021 at [place] and 0849 for whole body tremor, rigidity and left gaze deviation per staff. Concomitant EEG before, during and after the event did not show any EEG changes suggest seizure.   Hyperventilation and photic stimulation were not performed.      ABNORMALITY - Continuous slow, generalized   IMPRESSION: This study is suggestive of moderate diffuse encephalopathy, nonspecific etiology but most likely secondary to sedation. No seizures or epileptiform discharges were seen throughout the recording.   One event was recorded on 05/23/2021  at 2056 for downward gaze deviation without concomitant EEG change. This episode was most likely not epileptic.  One event was recorded on 05/24/2021 at [place] for whole body tremor, rigidity and left gaze deviation per staff. without concomitant EEG change. This episode was most likely not epileptic.     Jairy Angulo [name]

## 2021-05-24 NOTE — Progress Notes (Addendum)
Initial Nutrition Assessment ? ?DOCUMENTATION CODES:  ? ?Not applicable ? ?INTERVENTION:  ? ?Continue tube feeding via OG tube: ?Vital AF 1.2 at 60 ml/h (1440 ml per day) ? ?Provides 1728 kcal (2520 kcal total with propofol), 108 gm protein, 1168 ml free water daily. ? ?NUTRITION DIAGNOSIS:  ? ?Inadequate oral intake related to inability to eat as evidenced by NPO status. ? ?GOAL:  ? ?Patient will meet greater than or equal to 90% of their needs ? ?MONITOR:  ? ?Vent status, Labs, TF tolerance ? ?REASON FOR ASSESSMENT:  ? ?Ventilator, Consult ?Enteral/tube feeding initiation and management ? ?ASSESSMENT:  ? ?26 yo male admitted S/P witnessed seizure at home. Urine tox screen positive for amphetamines and benzodiazepines on admission. 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 ?  ?Received MD Consult for TF initiation and management. ? ?Patient is currently intubated on ventilator support ?MV: 8.5 L/min ?Temp (24hrs), Avg:99.1 ?F (37.3 ?C), Min:98.4 ?F (36.9 ?C), Max:101.3 ?F (38.5 ?C) ? ?Propofol: 30 ml/hr providing 792 kcal from lipid. ?  ?Labs reviewed. BUN < 5, K 2.9 (receiving supplementation), mag and phos both at the low end of WNL (receiving supplementation) ?CBG: [phone] ? ?Medications reviewed and include colace, miralax, precedex, keppra, mag sulfate, KCl, propofol.  ? ?Weight history reviewed. No significant weight changes noted.  ? ?NUTRITION - FOCUSED PHYSICAL EXAM: ? ?Flowsheet Row Most Recent Value  ?Orbital Region No depletion  ?Upper Arm Region No depletion  ?Thoracic and Lumbar Region No depletion  ?Buccal Region Unable to assess  ?Temple Region Unable to assess  ?Clavicle Bone Region No depletion  ?Clavicle and Acromion Bone Region No depletion  ?Scapular Bone Region Unable to assess  ?Dorsal Hand No depletion  ?Patellar Region No depletion  ?Anterior Thigh Region No depletion   ?Posterior Calf Region No depletion  ?Edema (RD Assessment) None  ?Hair Reviewed  ?Eyes Unable to assess  ?Mouth Unable to assess  ?Skin Reviewed  ?Nails Reviewed  ? ?  ? ? ?Diet Order:   ?Diet Order   ? ?       ?  Diet NPO time specified  Diet effective now       ?  ? ?  ?  ? ?  ? ? ?EDUCATION NEEDS:  ? ?No education needs have been identified at this time ? ?Skin:  Skin Assessment: Reviewed RN Assessment ? ?Last BM:  3/12 ? ?Height:  ? ?Ht Readings from Last 1 Encounters:  ?05/22/21 '5\' 7"'$  (1.702 m)  ? ? ?Weight:  ? ?Wt Readings from Last 1 Encounters:  ?05/22/21 63 kg  ? ? ? ?BMI:  Body mass index is 21.75 kg/m?. ? ?Estimated Nutritional Needs:  ? ?Kcal:  1990 ? ?Protein:  95-110 gm ? ?Fluid:  >/= 2 L ? ? ? ?Lucas Mallow RD, LDN, CNSC ?Please refer to Amion for contact information.                                                       ? ?

## 2021-05-24 NOTE — Progress Notes (Addendum)
Subjective: Had an episode of seizure-like activity this morning without definitive EEG change.  Mother at bedside states last week patient got in touch with his ex-girlfriend somehow and used Adderall as well as crystal meth.  She states he may have missed some doses of lamotrigine but does remember giving him his lamotrigine on Saturday morning. ? ?ROS: Unable to obtain due to poor mental status ? ?Examination ? ?Vital signs in last 24 hours: ?Temp:  [98.4 ?F (36.9 ?C)-101.3 ?F (38.5 ?C)] 99.5 ?F (37.5 ?C) (03/13 0800) ?Pulse Rate:  [53-103] 103 (03/13 1113) ?Resp:  [16-18] 16 (03/13 1113) ?BP: (97-138)/(64-104) 114/79 (03/13 1113) ?SpO2:  [99 %-100 %] 99 % (03/13 1113) ?FiO2 (%):  [40 %] 40 % (03/13 1113) ? ?General: lying in bed, NAD ?CVS: pulse-normal rate and rhythm ?RS: Intubated ?Extremities: normal  ?Neuro: Grimaces to noxious stimuli but does not open eyes, does not follow commands, PERRLA, corneal reflex intact, cough reflex intact, withdraws to noxious stimuli in all 4 extremities ? ?Basic Metabolic Panel: ?Recent Labs  ?Lab 05/22/21 ?1543 05/22/21 ?1545 05/22/21 ?1616 05/23/21 ?0158 05/24/21 ?8756  ?NA 136 135 133* 135 140  ?K 4.4 4.5 4.4 3.4* 2.9*  ?CL 98 106  --  109 111  ?CO2 <7*  --   --  18* 21*  ?GLUCOSE 116* 112*  --  111* 115*  ?BUN 15 16  --  11 <5*  ?CREATININE 1.42* 1.10  --  0.83 0.72  ?CALCIUM 9.5  --   --  8.1* 8.3*  ?MG  --   --   --  2.3 1.8  ?PHOS  --   --   --  2.7 2.9  ? ? ?CBC: ?Recent Labs  ?Lab 05/22/21 ?1543 05/22/21 ?1545 05/22/21 ?1616 05/23/21 ?0158 05/24/21 ?4332  ?WBC 10.5  --   --  6.8 6.8  ?HGB 18.4* 18.7* 16.7 14.2 14.2  ?HCT 54.9* 55.0* 49.0 38.9* 39.1  ?MCV 104.8*  --   --  95.8 97.0  ?PLT 241  --   --  138* 124*  ? ? ? ?Coagulation Studies: ?No results for input(s): LABPROT, INR in the last 72 hours. ? ?Imaging ? ?[place] wo contrast 05/23/2021: Chronic postoperative findings in the right occipital lobe with stable chronic prominence of low-density right extra-axial fluid  along the frontoparietal region. No acute or interval findings. Chronic ethmoid chronic bilateral sphenoid sinusitis. ? ?MR brain w and wo contrast 05/15/2021:  Small focus of increased enhancement along the anterior superior resection cavity since last year (series 20, image 91), although partially contiguous with the choroid plexus. Appearance is indeterminate at this time with otherwise unchanged MRI appearance of the treatment area. No new intracranial abnormality. ?  ? 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 ?Epilepsy with breakthrough seizure ?Substance use disorder ?Acute encephalopathy, medication induced ?-No definitive seizures overnight ?- Etiology of seizure: missing anti-seizure medications, using crystal meth ? ?Recommendations ?-Recommend weaning propofol at 10 mcg/h to stop ?-Resume home lamotrigine as patient got the last dose on Saturday morning ?-Continue Keppra 500 mg twice daily.  If any further seizures, can increase to 750 mg twice daily.  Of note, patient has had history of auditory hallucinations in the past.  Therefore would likely discontinue Keppra prior to discharge ?-Continue LTM EEG till tomorrow.  Will likely discontinue if no seizures overnight ?- Okay to  use Precedex/fentanyl for agitation/sedation ?-As needed IV Ativan 2 mg for clinical seizure-like activity ?-Continue seizure precautions ?-Management of rest of comorbidities per primary team ?-Discussed plan with patient's mother at bedside and ICU team in detail. ? ?CRITICAL CARE ?Performed by: Lora Havens ? ? ?Total critical care time: 35 minutes ? ?Critical care time was exclusive of separately billable procedures and treating other patients. ? ?Critical care was necessary to treat or prevent imminent or life-threatening  deterioration. ? ?Critical care was time spent personally by me on the following activities: development of treatment plan with patient and/or surrogate as well as nursing, discussions with consultants, evaluation of patient's response to treatment, examination of patient, obtaining history from patient or surrogate, ordering and performing treatments and interventions, ordering and review of laboratory studies, ordering and review of radiographic studies, pulse oximetry and re-evaluation of patient's condition. ? ? ?Zeb Comfort ?Epilepsy ?[facility] ?For questions after 5pm please refer to AMION to reach the Neurologist on call ?  ?

## 2021-05-24 NOTE — Progress Notes (Signed)
RN entered room to find patient desynchronous on ventilator with eyes fixed to the left and repeated eye movements towards the left. EEG button pushed. Dr. Tacy Learn notified. Instructed by Dr. Tacy Learn to increase Precedex until patient comfort is reached.  ?

## 2021-05-25 ENCOUNTER — Inpatient Hospital Stay (HOSPITAL_COMMUNITY): Payer: Medicaid Other

## 2021-05-25 DIAGNOSIS — T17908A Unspecified foreign body in respiratory tract, part unspecified causing other injury, initial encounter: Secondary | ICD-10-CM

## 2021-05-25 DIAGNOSIS — J690 Pneumonitis due to inhalation of food and vomit: Secondary | ICD-10-CM

## 2021-05-25 LAB — BASIC METABOLIC PANEL
Anion gap: 9 (ref 5–15)
BUN: 5 mg/dL — ABNORMAL LOW (ref 6–20)
CO2: 19 mmol/L — ABNORMAL LOW (ref 22–32)
Calcium: 8.6 mg/dL — ABNORMAL LOW (ref 8.9–10.3)
Chloride: 112 mmol/L — ABNORMAL HIGH (ref 98–111)
Creatinine, Ser: 0.68 mg/dL (ref 0.61–1.24)
GFR, Estimated: >60 mL/min (ref >60)
Glucose, Bld: 127 mg/dL — ABNORMAL HIGH (ref 70–99)
Potassium: 3.5 mmol/L (ref 3.5–5.1)
Sodium: 140 mmol/L (ref 135–145)

## 2021-05-25 LAB — GLUCOSE, CAPILLARY
Glucose-Capillary: 104 mg/dL — ABNORMAL HIGH (ref 70–99)
Glucose-Capillary: 104 mg/dL — ABNORMAL HIGH (ref 70–99)
Glucose-Capillary: 119 mg/dL — ABNORMAL HIGH (ref 70–99)
Glucose-Capillary: 123 mg/dL — ABNORMAL HIGH (ref 70–99)
Glucose-Capillary: 127 mg/dL — ABNORMAL HIGH (ref 70–99)
Glucose-Capillary: 132 mg/dL — ABNORMAL HIGH (ref 70–99)

## 2021-05-25 LAB — CBC
HCT: 39.9 % (ref 39.0–52.0)
Hemoglobin: 14 g/dL (ref 13.0–17.0)
MCH: 34.9 pg — ABNORMAL HIGH (ref 26.0–34.0)
MCHC: 35.1 g/dL (ref 30.0–36.0)
MCV: 99.5 fL (ref 80.0–100.0)
Platelets: 121 10*3/uL — ABNORMAL LOW (ref 150–400)
RBC: 4.01 MIL/uL — ABNORMAL LOW (ref 4.22–5.81)
RDW: 11.7 % (ref 11.5–15.5)
WBC: 8.7 10*3/uL (ref 4.0–10.5)
nRBC: 0 % (ref 0.0–0.2)

## 2021-05-25 LAB — PHOSPHORUS: Phosphorus: 2.3 mg/dL — ABNORMAL LOW (ref 2.5–4.6)

## 2021-05-25 LAB — MAGNESIUM: Magnesium: 1.9 mg/dL (ref 1.7–2.4)

## 2021-05-25 IMAGING — DX DG CHEST 1V PORT
1 series · 1 of 1 positions shown · non-contrast
Comparison: Portable chest [DATE] and earlier.

CLINICAL DATA: 25-year-old male with seizure, aspiration. Right
occipital and aplastic oligodendroglioma.

EXAM:
PORTABLE CHEST 1 VIEW

[chest ap]
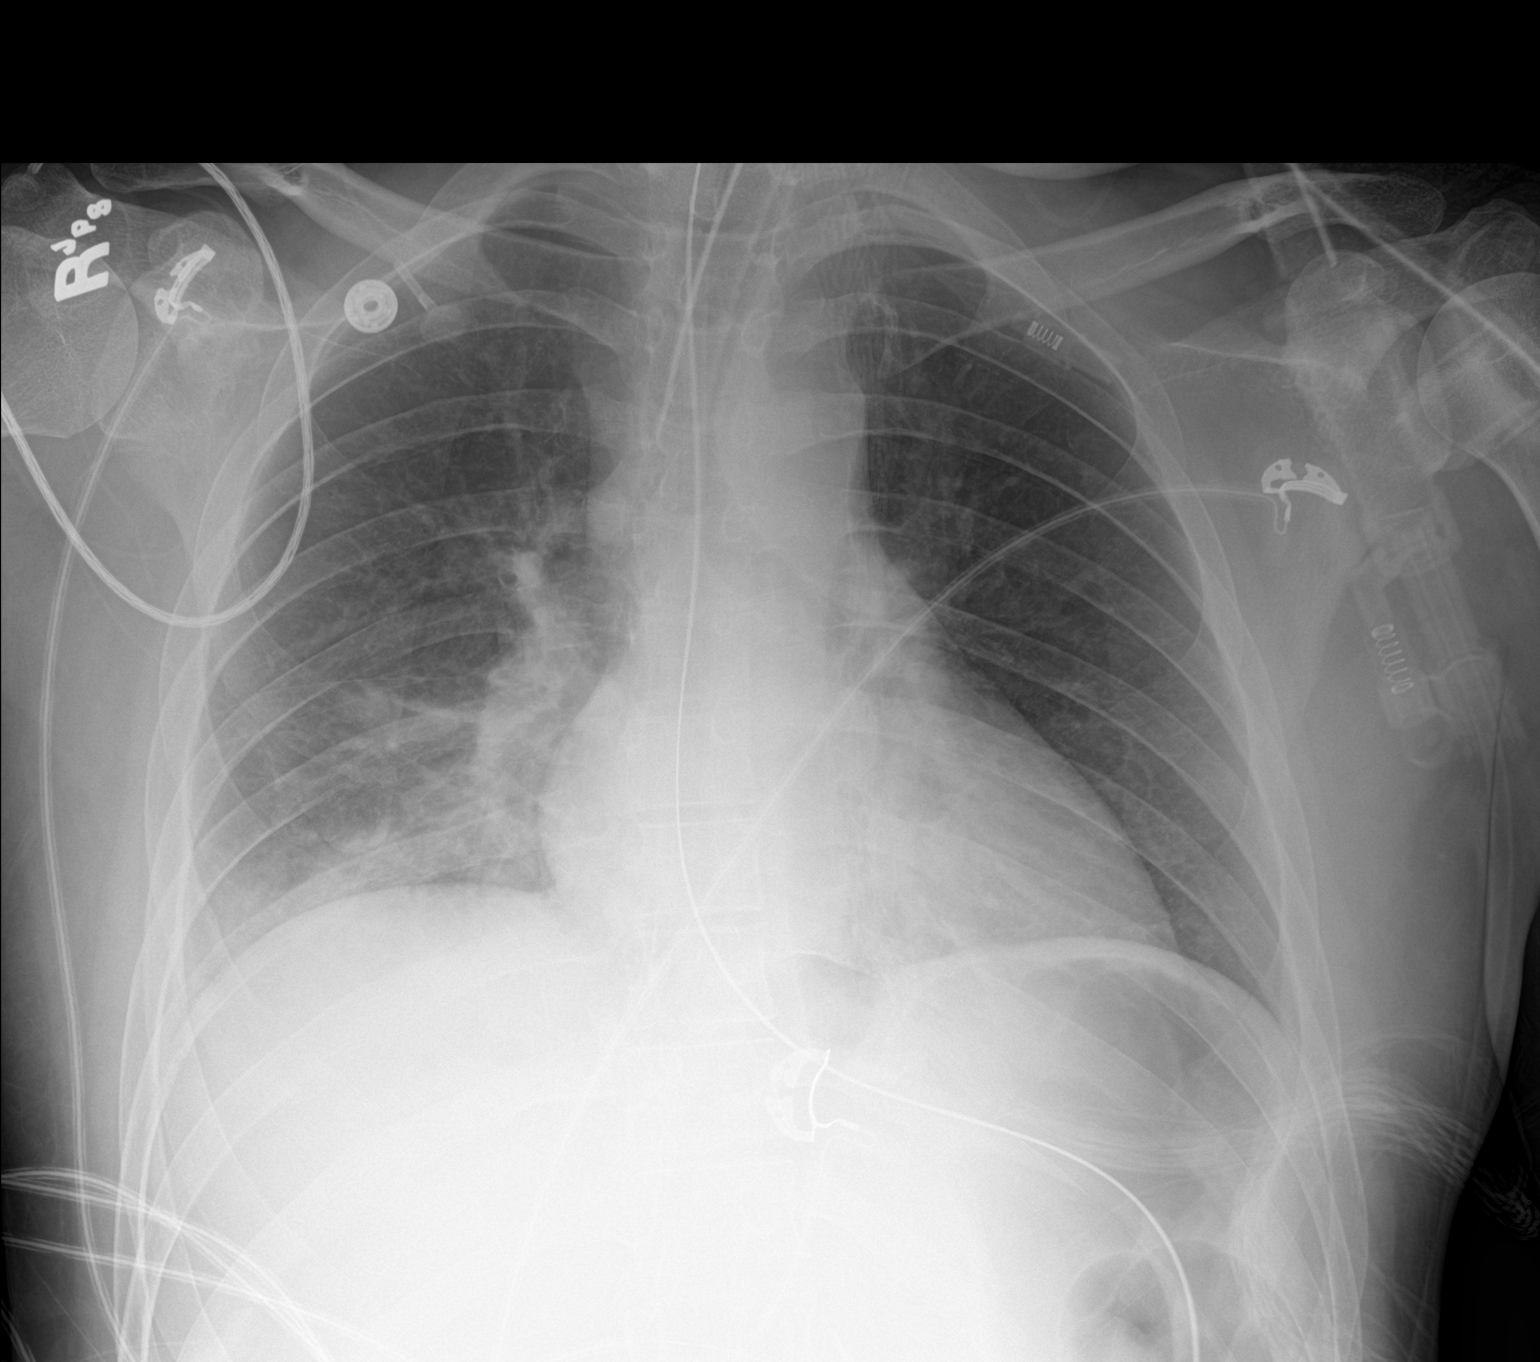

[1 of 1 positions shown; findings below may reference images not displayed]

FINDINGS: Portable AP semi upright view at [IV] hours. Endotracheal tube tip
in satisfactory position between the clavicles and carina. Enteric
tube courses to the left abdomen, tip not included. Mildly lower
lung volumes. Mediastinal contours remain normal. There is confluent
new right infrahilar and lung base opacity. Elsewhere Allowing for
portable technique the lungs are clear. No pneumothorax. No acute
osseous abnormality identified. Visible bowel-gas pattern within
normal limits.
IMPRESSION: 1. New confluent right infrahilar and lung base opacity, suspicious
for pneumonia.
2. Satisfactory endotracheal and visible enteric tube.

## 2021-05-25 MED ORDER — DOCUSATE SODIUM 100 MG PO CAPS
100.0000 mg | ORAL_CAPSULE | Freq: Two times a day (BID) | ORAL | Status: DC
  Administered 2021-05-25 – 2021-05-26 (×3): 100 mg via ORAL
  Filled 2021-05-25 (×4): qty 1

## 2021-05-25 MED ORDER — SODIUM CHLORIDE 0.9 % IV SOLN
3.0000 g | Freq: Four times a day (QID) | INTRAVENOUS | Status: DC
  Administered 2021-05-25 – 2021-05-27 (×9): 3 g via INTRAVENOUS
  Filled 2021-05-25 (×11): qty 8

## 2021-05-25 MED ORDER — ONDANSETRON HCL 4 MG/2ML IJ SOLN
INTRAMUSCULAR | Status: AC
  Filled 2021-05-25: qty 2

## 2021-05-25 MED ORDER — ACETAMINOPHEN 325 MG PO TABS
650.0000 mg | ORAL_TABLET | Freq: Four times a day (QID) | ORAL | Status: DC | PRN
  Administered 2021-05-25 – 2021-05-26 (×2): 650 mg via ORAL
  Filled 2021-05-25: qty 2

## 2021-05-25 MED ORDER — POLYETHYLENE GLYCOL 3350 17 G PO PACK
17.0000 g | PACK | Freq: Every day | ORAL | Status: DC
Start: 2021-05-26 — End: 2021-05-27
  Filled 2021-05-25: qty 1

## 2021-05-25 MED ORDER — LAMOTRIGINE 100 MG PO TABS
250.0000 mg | ORAL_TABLET | Freq: Two times a day (BID) | ORAL | Status: DC
Start: 2021-05-25 — End: 2021-05-27
  Administered 2021-05-25 – 2021-05-27 (×5): 250 mg via ORAL
  Filled 2021-05-25: qty 2
  Filled 2021-05-25: qty 3
  Filled 2021-05-25 (×3): qty 2

## 2021-05-25 MED ORDER — ONDANSETRON HCL 4 MG/2ML IJ SOLN
4.0000 mg | Freq: Four times a day (QID) | INTRAMUSCULAR | Status: DC | PRN
  Administered 2021-05-25: 4 mg via INTRAVENOUS

## 2021-05-25 MED ORDER — ORAL CARE MOUTH RINSE: 15.0000 mL | Freq: Two times a day (BID) | OROMUCOSAL | Status: DC

## 2021-05-25 MED ORDER — DEXTROSE 5 % IV SOLN
30.0000 mmol | Freq: Once | INTRAVENOUS | Status: AC
  Administered 2021-05-25: 30 mmol via INTRAVENOUS
  Filled 2021-05-25: qty 10

## 2021-05-25 NOTE — Progress Notes (Signed)
EEG maintenance performed. Patient did have some redness under FP1 and it was relocated. Test button was TESTED.  ?

## 2021-05-25 NOTE — Progress Notes (Signed)
LTM maint complete - no skin breakdown , irritation at A1 F8 Fp1 Fp2  leads adjusted with in range. ?[facility] monitored, Event button test confirmed by [facility]. ? ? ?

## 2021-05-25 NOTE — Progress Notes (Signed)
?  Transition of Care (TOC) Screening Note ? ? ?Patient Details  ?Name: Stephen Beard ?Date of Birth: 27-Mar-1995 ? ? ?Transition of Care (TOC) CM/SW Contact:    ?Benard Halsted, LCSW ?Phone Number: ?05/25/2021, 9:16 AM ? ? ? ?Transition of Care Department [facility]) has reviewed patient and no TOC needs have been identified at this time. We will continue to monitor patient advancement through interdisciplinary progression rounds. If new patient transition needs arise, please place a TOC consult. ? ? ?

## 2021-05-25 NOTE — Progress Notes (Signed)
eLink Physician-Brief Progress Note ?Patient Name: Stephen Beard ?DOB: 11/06/1995 ?MRN: [id number] ? ? ?Date of Service ? 05/25/2021  ?HPI/Events of Note ? Patient vomited, enteral nutrition was held.  ?eICU Interventions ? PRN Zofran ordered for nausea / vomiting. CXR ordered to r/o aspiration.  ? ? ? ?  ? ?Frederik Pear ?05/25/2021, 3:42 AM ?

## 2021-05-25 NOTE — Plan of Care (Signed)
  Problem: Clinical Measurements: Goal: Respiratory complications will improve Outcome: Progressing   Problem: Coping: Goal: Level of anxiety will decrease Outcome: Progressing   Problem: Pain Managment: Goal: General experience of comfort will improve Outcome: Progressing   

## 2021-05-25 NOTE — Progress Notes (Signed)
? ?NAME:  Stephen Beard, MRN:  [id number], DOB:  1996-02-24, LOS: 3 ?ADMISSION DATE:  05/22/2021, CONSULTATION DATE:  05/22/2021 ?REFERRING MD:  Dr. Jeanell Sparrow, ER, CHIEF COMPLAINT:  Seizure  ? ?History of Present Illness:  ?26 yo male smoker with hx of Rt occipital lobe oligodendroglioma s/p craniectomy, seizure disorder, ADD and substance abuse had witnessed seizure at home.  He continued to have seizure activity in ER.  Required intubation for airway protection.  Started on diprivan and then versed infusions.  Seen by neuro in ER and started on LTM.  Reported had been on binge of methamphetamine for several days prior to admission.  Uncertain if he has been compliant with medications as outpt. ?Hx from chart and medical team.  ? 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 ?Significant Hospital Events: ?Including procedures, antibiotic start and stop dates in addition to other pertinent events   ?3/11 Admit, neuro consulted, intubated, start LTM ?3/12 noted to have change in pupil size and gaze deviation. CT Head obtained without acute abnormalities. Sedation changed from versed to propofol ?3/13 seizure like activity noted for which given Ativan with improvement; home lamictal resumed  ? ?Studies:  ?CT head 3/11 >> Rt hemisphere mild atrophy, stable ventriculomegaly with ex vacuo dilation of atrium, occipital horn, and temporal horn of Rt lateral ventricle.  Post craniotomy changes Rt parietooccipital.  Foci of dystrophic calcification at the surgical bed.  Chronic white matter hypoattenuation at posterior Lt centrum semiovale. ?CT Head 3/12 >> Chronic postoperative findings in the right occipital lobe with ?stable chronic prominence of low-density right extra-axial fluid ?along the frontoparietal region. No acute or interval findings. ? ?Interim History / Subjective:  ?Patient  remains intubated and on precedex. He is awake and alert, following commands appropriately. Overnight, noted to have an aspiration event.  ? ?Objective   ?Blood pressure 126/84, pulse (!) 49, temperature 99.4 ?F (37.4 ?C), temperature source Axillary, resp. rate 16, height '5\' 7"'$  (1.702 m), weight 63 kg, SpO2 98 %. ?   ?Vent Mode: PRVC ?FiO2 (%):  [30 %-40 %] 30 % ?Set Rate:  [16 bmp] 16 bmp ?Vt Set:  [520 mL] 520 mL ?PEEP:  [5 cmH20] 5 cmH20 ?Plateau Pressure:  [16 cmH20-24 cmH20] 24 cmH20  ? ?Intake/Output Summary (Last 24 hours) at 05/25/2021 0716 ?Last data filed at 05/25/2021 0600 ?Gross per 24 hour  ?Intake 3312.92 ml  ?Output 1755 ml  ?Net 1557.92 ml  ? ?Filed Weights  ? 05/22/21 2115 05/25/21 0500  ?Weight: 63 kg 63 kg  ? ? ?Examination: ? ?General - intubated; no acute distress  ?Eyes - pupils reactive to light ?ENT - ETT in place ?Cardiac - regular rate/rhythm, no murmur ?Chest - bibasilar crackles; remains weaning on vent  ?Abdomen - soft, non tender, + bowel sounds ?Extremities - decreased muscle bulk; no cyanosis, clubbing, or edema ?Skin - no rashes ? ?Resolved Hospital Problem list   ?Sinus tachycardia  ?Assessment & Plan:  ? ?Status epilepticus with hx of seizure disorder in setting of Rt occipital oligodendroglioma s/p craniotomy. ?- neurology consulted, appreciate recommendations  ?- continue keppra and home lamictal ?- Precedex weaning down ? ?Hx of Schizoaffective disorder, ADD, Depression, Polysubstance abuse. ?- can start to resume outpt bupsar, zyprexa, zoloft, trazodone once extubated  ? ?Acute hypoxic resp failure 2/2 status epilepticus  ?Aspiration pneumonia  ?Patient is tolerating wean on vent; will plan for extubation  later today ?Did have aspiration event overnight with CXR showing developing PNA. Also confirmed on POC [place] at bedside. ?- Extubate today ?- Start IV Unasyn day 1/5 ? ?Thrombocytopenia ?Likely in 2/2 acute illness. Stable ?- Trend CBC ? ?Hypokalemia ?NAGMA ?- Trend BMP ? ?Best  Practice (right click and "Reselect all SmartList Selections" daily)  ? ?Diet/type: tubefeeds ?DVT prophylaxis: LMWH ?GI prophylaxis: PPI ?Lines: N/A ?Foley:  N/A ?Code Status:  full code ?Last date of multidisciplinary goals of care discussion [3/14 discussed with family at bedside] ? ?Labs   ?CBC: ?Recent Labs  ?Lab 05/22/21 ?1543 05/22/21 ?1545 05/22/21 ?1616 05/23/21 ?0158 05/24/21 ?5701 05/25/21 ?0454  ?WBC 10.5  --   --  6.8 6.8 8.7  ?HGB 18.4* 18.7* 16.7 14.2 14.2 14.0  ?HCT 54.9* 55.0* 49.0 38.9* 39.1 39.9  ?MCV 104.8*  --   --  95.8 97.0 99.5  ?PLT 241  --   --  138* 124* 121*  ? ? ?Basic Metabolic Panel: ?Recent Labs  ?Lab 05/22/21 ?1543 05/22/21 ?1545 05/22/21 ?1616 05/23/21 ?0158 05/24/21 ?7793 05/25/21 ?0454  ?NA 136 135 133* 135 140 140  ?K 4.4 4.5 4.4 3.4* 2.9* 3.5  ?CL 98 106  --  109 111 112*  ?CO2 <7*  --   --  18* 21* 19*  ?GLUCOSE 116* 112*  --  111* 115* 127*  ?BUN 15 16  --  11 <5* 5*  ?CREATININE 1.42* 1.10  --  0.83 0.72 0.68  ?CALCIUM 9.5  --   --  8.1* 8.3* 8.6*  ?MG  --   --   --  2.3 1.8 1.9  ?PHOS  --   --   --  2.7 2.9 2.3*  ? ?GFR: ?Estimated Creatinine Clearance: 124.7 mL/min (by C-G formula based on SCr of 0.68 mg/dL). ?Recent Labs  ?Lab 05/22/21 ?1543 05/23/21 ?0158 05/24/21 ?9030 05/25/21 ?0454  ?WBC 10.5 6.8 6.8 8.7  ? ? ?Liver Function Tests: ?Recent Labs  ?Lab 05/22/21 ?1543 05/23/21 ?0158  ?AST 94* 39  ?ALT 69* 42  ?ALKPHOS 75 47  ?BILITOT 1.4* 1.0  ?PROT 7.5 5.2*  ?ALBUMIN 4.6 3.1*  ? ?No results for input(s): LIPASE, AMYLASE in the last 168 hours. ?No results for input(s): AMMONIA in the last 168 hours. ? ?ABG ?   ?Component Value Date/Time  ? PHART 7.219 (L) 05/22/2021 1616  ? PCO2ART 33.7 05/22/2021 1616  ? PO2ART 352 (H) 05/22/2021 1616  ? HCO3 13.8 (L) 05/22/2021 1616  ? TCO2 15 (L) 05/22/2021 1616  ? ACIDBASEDEF 13.0 (H) 05/22/2021 1616  ? O2SAT 100 05/22/2021 1616  ?  ? ?Coagulation Profile: ?No results for input(s): INR, PROTIME in the last 168 hours. ? ?Cardiac  Enzymes: ?No results for input(s): CKTOTAL, CKMB, CKMBINDEX, TROPONINI in the last 168 hours. ? ?HbA1C: ?No results found for: HGBA1C ? ?CBG: ?Recent Labs  ?Lab 05/24/21 ?1136 05/24/21 ?1515 05/24/21 ?1916 05/24/21 ?2320 05/25/21 ?0304  ?GLUCAP 123* 136* 136* 135* 127*  ? ? ?Critical care time: 35 minutes  ? ? ?

## 2021-05-25 NOTE — Evaluation (Signed)
Physical Therapy Evaluation Patient Details Name: Stephen Beard MRN: [id number] DOB: 12-09-1995 Today's Date: 05/25/2021  History of Present Illness  Stephen Beard is a 26 y.o. male  with witnessed grand mal seizure at home after using crystal meth. Intubated for airway protection, extubated 05/25/21. EEG consistent with epilepsy and cortical dysfunction arising from R hemisphere, also moderate diffuse encephalopathy. 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  Clinical Impression  Pt admitted with above diagnosis. Pt extubated this morning, awake and speaking with mom, low voice from intubation. Pt emotional on eval and somewhat childlike. Follows 1 step commands with increased time. LLE mildly weaker than R which is his baseline. Pt stood EOB with min A and was mildly unsteady. Ambulation limited by EEG but pt had difficulty stepping LLE to move to [facility]. Would benefit from outpt PT for balance after d/c. Mom reports that she works at night but her husband is home when she is at work.  Pt currently with functional limitations due to the deficits listed below (see PT Problem List). Pt will benefit from skilled PT to increase their independence and safety with mobility to allow discharge to the venue listed below.          Recommendations for follow up therapy are one component of a multi-disciplinary discharge planning process, led by the attending physician.  Recommendations may be updated based on patient status, additional functional criteria and insurance authorization.  Follow Up Recommendations Outpatient PT    Assistance Recommended at Discharge Frequent or constant Supervision/Assistance  Patient can return home with the following  Assistance with cooking/housework;A little help with walking and/or transfers;A little help with  bathing/dressing/bathroom;Direct supervision/assist for medications management;Direct supervision/assist for financial management;Assist for transportation    Equipment Recommendations Other (comment) (TBD)  Recommendations for Other Services  OT consult    Functional Status Assessment Patient has had a recent decline in their functional status and demonstrates the ability to make significant improvements in function in a reasonable and predictable amount of time.     Precautions / Restrictions Precautions Precautions: Fall;Other (comment) (seizure) Restrictions Weight Bearing Restrictions: No      Mobility  Bed Mobility Overal bed mobility: Needs Assistance Bed Mobility: Supine to Sit, Sit to Supine     Supine to sit: Min assist Sit to supine: Min assist   General bed mobility comments: min A for sequencing to get OOB. Min A to LLE for return to bed    Transfers Overall transfer level: Needs assistance Equipment used: 1 person hand held assist Transfers: Sit to/from Stand Sit to Stand: Min assist           General transfer comment: min A to steady    Ambulation/Gait         Gait velocity: decreased Gait velocity interpretation: <1.31 ft/sec, indicative of household ambulator   General Gait Details: limited today by continuous EEG being performed. Stepped alongside bed, had some diffciulty lifting LLE. Min A needed for support  Stairs            Wheelchair Mobility    Modified Rankin (Stroke Patients Only)       Balance Overall balance assessment: Needs assistance Sitting-balance support: No upper extremity supported, Feet supported Sitting balance-Leahy Scale: Fair Sitting balance - Comments: mild posterior lean, corrects with vc's Postural control: Posterior lean Standing balance support: Single extremity supported Standing balance-Leahy Scale: Fair Standing balance comment: able  to maintain static stance without support, unsteady with  dynamic                             Pertinent Vitals/Pain Pain Assessment Pain Assessment: No/denies pain    Home Living Family/patient expects to be discharged to:: Private residence Living Arrangements: Parent Available Help at Discharge: Family;Available 24 hours/day Type of Home: Apartment Home Access: Level entry       Home Layout: One level Home Equipment: None Additional Comments: pt lives with mom, her husband, and uncle with Downs Syndrome    Prior Function Prior Level of Function : Needs assist;History of Falls (last six months)             Mobility Comments: mom reports that pt has had visual deficits since crani and subsequent balance deficits with occasional falls ADLs Comments: has been unable to work since Tour manager, showers independently. Can cook with supervision but mom doesn't let him use stove or oven alone     Hand Dominance   Dominant Hand: Right    Extremity/Trunk Assessment   Upper Extremity Assessment Upper Extremity Assessment: Defer to OT evaluation    Lower Extremity Assessment Lower Extremity Assessment: LLE deficits/detail LLE Deficits / Details: LLE mildly weaker than R, 4/5 throughout LLE Sensation: WNL LLE Coordination: decreased gross motor    Cervical / Trunk Assessment Cervical / Trunk Assessment: Normal  Communication   Communication: Other (comment) (low voice since extubation this AM)  Cognition Arousal/Alertness: Awake/alert Behavior During Therapy: Restless, Anxious Overall Cognitive Status: Impaired/Different from baseline Area of Impairment: Safety/judgement, Problem solving, Orientation, Memory, Following commands, Attention                 Orientation Level: Disoriented to, Situation, Time Current Attention Level: Sustained Memory: Decreased short-term memory Following Commands: Follows one step commands consistently, Follows one step commands with increased time Safety/Judgement: Decreased  awareness of safety, Decreased awareness of deficits   Problem Solving: Requires verbal cues General Comments: pt tearful at times on eval. Somewhat childlike. Has had some deficits since crani and was hearing a specific male voice in his head until 1 mo ago. Disconjugate gaze noted at times today and pt slow to process info        General Comments General comments (skin integrity, edema, etc.): HR 65 bpm, SPO2 93% on RA. Mom and younger brother present    Exercises     Assessment/Plan    PT Assessment Patient needs continued PT services  PT Problem List Decreased balance;Decreased coordination;Decreased mobility;Decreased cognition;Decreased activity tolerance;Decreased strength;Decreased knowledge of use of DME;Decreased safety awareness;Decreased knowledge of precautions       PT Treatment Interventions DME instruction;Gait training;Stair training;Functional mobility training;Therapeutic activities;Therapeutic exercise;Balance training;Neuromuscular re-education;Cognitive remediation;Patient/family education    PT Goals (Current goals can be found in the Care Plan section)  Acute Rehab PT Goals Patient Stated Goal: return home PT Goal Formulation: With patient/family Time For Goal Achievement: 06/08/21 Potential to Achieve Goals: Good    Frequency Min 3X/week     Co-evaluation               AM-PAC PT "6 Clicks" Mobility  Outcome Measure Help needed turning from your back to your side while in a flat bed without using bedrails?: A Little Help needed moving from lying on your back to sitting on the side of a flat bed without using bedrails?: A Little Help needed moving to and from a bed to  a chair (including a wheelchair)?: A Little Help needed standing up from a chair using your arms (e.g., wheelchair or bedside chair)?: A Little Help needed to walk in hospital room?: A Lot Help needed climbing 3-5 steps with a railing? : A Lot 6 Click Score: 16    End of  Session Equipment Utilized During Treatment: Gait belt Activity Tolerance: Patient tolerated treatment well Patient left: in bed;with call bell/phone within reach;with bed alarm set;with family/visitor present;with restraints reapplied (mitts and possey) Nurse Communication: Mobility status PT Visit Diagnosis: Unsteadiness on feet (R26.81);History of falling (Z91.81)    Time: 4098-1191 PT Time Calculation (min) (ACUTE ONLY): 37 min   Charges:   PT Evaluation $PT Eval Moderate Complexity: 1 Mod PT Treatments $Therapeutic Activity: 8-22 mins        Lyanne Co, PT  Acute Rehab Services  Pager [phone] Office [phone]   Lawana Chambers Zyan Coby 05/25/2021, 5:16 PM

## 2021-05-25 NOTE — Progress Notes (Addendum)
Subjective: Had 2 events overnight as described in EEG report.  Patient was extubated this morning.  States he is feeling fine but very tired and has trouble speaking. ? ?ROS: negative except above ? ?Examination ? ?Vital signs in last 24 hours: ?Temp:  [98.3 ?F (36.8 ?C)-102 ?F (38.9 ?C)] 101.7 ?F (38.7 ?C) (03/14 0800) ?Pulse Rate:  [49-96] 67 (03/14 1000) ?Resp:  [16-28] 26 (03/14 1000) ?BP: (89-143)/(53-112) 131/91 (03/14 1000) ?SpO2:  [96 %-100 %] 98 % (03/14 1000) ?FiO2 (%):  [30 %-40 %] 30 % (03/14 0740) ?Weight:  [63 kg] 63 kg (03/14 0500) ? ?General: lying in bed, NAD ?CVS: pulse-normal rate and rhythm ?Extremities: normal  ?Neuro: Awake, oriented to person and place, follows commands, PERRLA, EOMI, no facial asymmetry, antigravity strength in all 4 extremities ? ?Basic Metabolic Panel: ?Recent Labs  ?Lab 05/22/21 ?1543 05/22/21 ?1545 05/22/21 ?1616 05/23/21 ?0158 05/24/21 ?8657 05/25/21 ?0454  ?NA 136 135 133* 135 140 140  ?K 4.4 4.5 4.4 3.4* 2.9* 3.5  ?CL 98 106  --  109 111 112*  ?CO2 <7*  --   --  18* 21* 19*  ?GLUCOSE 116* 112*  --  111* 115* 127*  ?BUN 15 16  --  11 <5* 5*  ?CREATININE 1.42* 1.10  --  0.83 0.72 0.68  ?CALCIUM 9.5  --   --  8.1* 8.3* 8.6*  ?MG  --   --   --  2.3 1.8 1.9  ?PHOS  --   --   --  2.7 2.9 2.3*  ? ? ?CBC: ?Recent Labs  ?Lab 05/22/21 ?1543 05/22/21 ?1545 05/22/21 ?1616 05/23/21 ?0158 05/24/21 ?8469 05/25/21 ?0454  ?WBC 10.5  --   --  6.8 6.8 8.7  ?HGB 18.4* 18.7* 16.7 14.2 14.2 14.0  ?HCT 54.9* 55.0* 49.0 38.9* 39.1 39.9  ?MCV 104.8*  --   --  95.8 97.0 99.5  ?PLT 241  --   --  138* 124* 121*  ? ? ? ?Coagulation Studies: ?No results for input(s): LABPROT, INR in the last 72 hours. ? ?Imaging ?No new brain imaging overnight ? 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  ?Epilepsy with  breakthrough seizure ?Substance use disorder ?Acute encephalopathy, resolved ?- No definitive seizures overnight ?- Etiology of seizure: missing anti-seizure medications, using crystal meth ?  ?Recommendations ?-Continue lamotrigine 250 mg twice daily ?-If patient remains seizure-free today, will consider discontinuing Keppra tomorrow ?-Continue LTM EEG today.  Will likely discontinue tomorrow if no seizures overnight ?- Okay to use Precedex/fentanyl for agitation/sedation ?-As needed IV Ativan 2 mg for clinical seizure-like activity ?-Continue seizure precautions ?-Management of rest of comorbidities per primary team ? ?I have spent a total of  36 minutes with the patient reviewing hospital notes,  test results, labs and examining the patient as well as establishing an assessment and plan that was discussed personally with the patient.  > 50% of time was spent in direct patient care. ?  ?Zeb Comfort ?Epilepsy ?[facility] ?For questions after 5pm please refer to AMION to reach the Neurologist on call ? ?

## 2021-05-25 NOTE — Procedures (Addendum)
Patient Name: Stephen Beard  ?MRN: [id number]  ?Epilepsy Attending: Lora Havens  ?Referring Physician/Provider: Amie Portland, M ?Duration: 05/24/2021 1735 to 05/25/2021 1735 ?  ?Patient history: 26yo m presented with seizure like episodes. EEG to evaluate for seizure ?  ?Level of alertness: lethargic  ?  ?AEDs during EEG study: LEV ?  ?Technical aspects: This EEG study was done with scalp electrodes positioned according to the 10-20 International system of electrode placement. Electrical activity was acquired at a sampling rate of '500Hz'$  and reviewed with a high frequency filter of '70Hz'$  and a low frequency filter of '1Hz'$ . EEG data were recorded continuously and digitally stored.  ?  ?Description: EEG showed continuous generalized and maximal right posterior quadrant 6-9 hz theta-alpha activity admixed with intermittent generalized 2-'3hz'$  delta slowing. There were also intermittent lateralized periodic discharges in right hemisphere, maximal right posterior quadrant at '1Hz'$ .  ?  ?Patient event button was pressed on 05/24/2021 at [facility] for left gaze deviation. Concomitant EEG before, during and after the event showed right occipital spikes without definite evolution.  ?  ?Patient event button was pressed on 05/24/2021 at 2242. Per RN, left pupil was uneven but reactive to light. Concomitant EEG before, during and after the event did not show any EEG changes suggest seizure. ?  ?Hyperventilation and photic stimulation were not performed.    ?  ?ABNORMALITY ?- Lateralized periodic discharges in right hemisphere, maximal right posterior quadrant ?- Continuous slow, generalized and maximal right posterior quadrant ?  ?IMPRESSION: ?This study is consistent with patient's history of epilepsy and cortical dysfunction arising from  right hemisphere, maximal right posterior quadrant secondary to underlying craniotomy.  Additionally there is moderate diffuse encephalopathy, nonspecific etiology. No definite seizures were seen  throughout the recording. ?  ?One event was recorded on 05/24/2021 at [place] for left gaze deviation without concomitant EEG change.  However, focal motor seizure may not be seen on scalp EEG.  Clinical correlation is recommended. ? ?One event was recorded on 05/24/2021 at 2242. Per RN, left pupil was uneven but reactive to light without concomitant EEG change. This episode was most likely not epileptic.  ?  ?Lora Havens  ?

## 2021-05-25 NOTE — Procedures (Signed)
Extubation Procedure Note ? ?Patient Details:   ?Name: Stephen Beard ?DOB: February 18, 1996 ?MRN: [id number] ?  ?Airway Documentation:  ?  ?Vent end date: 05/25/21 Vent end time: 0812  ? ?Evaluation ? O2 sats: stable throughout ?Complications: No apparent complications ?Patient did tolerate procedure well. ?Bilateral Breath Sounds: Clear, Diminished ?  ?Yes ? ?Pt extubated per MD order. Placed on 4L [place]. Positive cuff leak noted, no stridor heard. RT will continue to monitor.  ? ?Tobi Bastos ?05/25/2021, 8:16 AM ? ?

## 2021-05-25 NOTE — Procedures (Signed)
Bed side chest [place]: ? ?Showing Rt sided consolidation with no effusion. ? ? ? ? ?

## 2021-05-26 DIAGNOSIS — D496 Neoplasm of unspecified behavior of brain: Secondary | ICD-10-CM | POA: Diagnosis not present

## 2021-05-26 DIAGNOSIS — J9601 Acute respiratory failure with hypoxia: Secondary | ICD-10-CM | POA: Diagnosis not present

## 2021-05-26 DIAGNOSIS — T17908A Unspecified foreign body in respiratory tract, part unspecified causing other injury, initial encounter: Secondary | ICD-10-CM | POA: Diagnosis not present

## 2021-05-26 LAB — BASIC METABOLIC PANEL
Anion gap: 12 (ref 5–15)
BUN: <5 mg/dL — ABNORMAL LOW (ref 6–20)
CO2: 21 mmol/L — ABNORMAL LOW (ref 22–32)
Calcium: 8.8 mg/dL — ABNORMAL LOW (ref 8.9–10.3)
Chloride: 104 mmol/L (ref 98–111)
Creatinine, Ser: 0.65 mg/dL (ref 0.61–1.24)
GFR, Estimated: >60 mL/min (ref >60)
Glucose, Bld: 85 mg/dL (ref 70–99)
Potassium: 3.8 mmol/L (ref 3.5–5.1)
Sodium: 137 mmol/L (ref 135–145)

## 2021-05-26 LAB — CBC
HCT: 39.3 % (ref 39.0–52.0)
Hemoglobin: 14.4 g/dL (ref 13.0–17.0)
MCH: 35.1 pg — ABNORMAL HIGH (ref 26.0–34.0)
MCHC: 36.6 g/dL — ABNORMAL HIGH (ref 30.0–36.0)
MCV: 95.9 fL (ref 80.0–100.0)
Platelets: 127 10*3/uL — ABNORMAL LOW (ref 150–400)
RBC: 4.1 MIL/uL — ABNORMAL LOW (ref 4.22–5.81)
RDW: 11.2 % — ABNORMAL LOW (ref 11.5–15.5)
WBC: 7.3 10*3/uL (ref 4.0–10.5)
nRBC: 0 % (ref 0.0–0.2)

## 2021-05-26 LAB — GLUCOSE, CAPILLARY
Glucose-Capillary: 87 mg/dL (ref 70–99)
Glucose-Capillary: 88 mg/dL (ref 70–99)

## 2021-05-26 MED ORDER — LEVETIRACETAM 500 MG PO TABS: 500.0000 mg | ORAL_TABLET | Freq: Two times a day (BID) | ORAL | Status: DC

## 2021-05-26 MED ORDER — OLANZAPINE 5 MG PO TABS
20.0000 mg | ORAL_TABLET | Freq: Every day | ORAL | Status: DC
Start: 2021-05-26 — End: 2021-05-27
  Administered 2021-05-26: 20 mg via ORAL
  Filled 2021-05-26: qty 2

## 2021-05-26 MED ORDER — SERTRALINE HCL 50 MG PO TABS
50.0000 mg | ORAL_TABLET | Freq: Every day | ORAL | Status: DC
  Administered 2021-05-26 – 2021-05-27 (×2): 50 mg via ORAL
  Filled 2021-05-26 (×2): qty 1

## 2021-05-26 MED ORDER — ENSURE ENLIVE PO LIQD
237.0000 mL | Freq: Two times a day (BID) | ORAL | Status: DC
  Administered 2021-05-26 – 2021-05-27 (×2): 237 mL via ORAL

## 2021-05-26 MED ORDER — BUSPIRONE HCL 5 MG PO TABS
10.0000 mg | ORAL_TABLET | Freq: Two times a day (BID) | ORAL | Status: DC
  Administered 2021-05-26 – 2021-05-27 (×3): 10 mg via ORAL
  Filled 2021-05-26: qty 1
  Filled 2021-05-26: qty 2
  Filled 2021-05-26: qty 1

## 2021-05-26 NOTE — Progress Notes (Signed)
? ?NAME:  Stephen Beard, MRN:  [id number], DOB:  1996-03-05, LOS: 4 ?ADMISSION DATE:  05/22/2021, CONSULTATION DATE:  05/22/2021 ?REFERRING MD:  Dr. Jeanell Sparrow, ER, CHIEF COMPLAINT:  Seizure  ? ?History of Present Illness:  ?26 yo male smoker with hx of Rt occipital lobe oligodendroglioma s/p craniectomy, seizure disorder, ADD and substance abuse had witnessed seizure at home.  He continued to have seizure activity in ER.  Required intubation for airway protection.  Started on diprivan and then versed infusions.  Seen by neuro in ER and started on LTM.  Reported had been on binge of methamphetamine for several days prior to admission.  Uncertain if he has been compliant with medications as outpt. ?Hx from chart and medical team.  ? 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 ?Significant Hospital Events: ?Including procedures, antibiotic start and stop dates in addition to other pertinent events   ?3/11 Admit, neuro consulted, intubated, start LTM ?3/12 noted to have change in pupil size and gaze deviation. CT Head obtained without acute abnormalities. Sedation changed from versed to propofol ?3/13 seizure like activity noted for which given Ativan with improvement; home lamictal resumed  ?3/14 extubated to room air; weaning precedex gtt  ? ?Studies:  ?CT head 3/11 >> Rt hemisphere mild atrophy, stable ventriculomegaly with ex vacuo dilation of atrium, occipital horn, and temporal horn of Rt lateral ventricle.  Post craniotomy changes Rt parietooccipital.  Foci of dystrophic calcification at the surgical bed.  Chronic white matter hypoattenuation at posterior Lt centrum semiovale. ?CT Head 3/12 >> Chronic postoperative findings in the right occipital lobe with ?stable chronic prominence of low-density right extra-axial fluid ?along the frontoparietal region. No acute or interval  findings. ? ?Interim History / Subjective:  ?Patient extubated yesterday to room air. He reports feeling well overall; however, still has some productive cough. Reports that it was difficult to sleep overnight; requesting home trazodone.   ? ?Objective   ?Blood pressure (!) 135/96, pulse (!) 55, temperature 99.7 ?F (37.6 ?C), temperature source Oral, resp. rate 10, height '5\' 7"'$  (1.702 m), weight 60.1 kg, SpO2 93 %. ?   ?Vent Mode: PSV;CPAP ?FiO2 (%):  [30 %] 30 % ?PEEP:  [5 cmH20] 5 cmH20 ?Pressure Support:  [8 cmH20] 8 cmH20  ? ?Intake/Output Summary (Last 24 hours) at 05/26/2021 0715 ?Last data filed at 05/26/2021 0600 ?Gross per 24 hour  ?Intake 2593.4 ml  ?Output 4620 ml  ?Net -2026.6 ml  ? ?Filed Weights  ? 05/22/21 2115 05/25/21 0500 05/26/21 0500  ?Weight: 63 kg 63 kg 60.1 kg  ? ? ?Examination: ?General - generally well appearing young male, NAD ?HEENT: [place]/AT, PERRL, EOMI ?Cardiac - regular rate/rhythm, no murmur ?Chest - Bibasilar crackles; no wheezing or rhonchi   ?Abdomen - soft, non tender, + bowel sounds ?Extremities - normal bulk and tone; no cyanosis, clubbing, or edema ?Skin - no rashes ? ?Resolved Hospital Problem list   ?Sinus tachycardia  ?Acute hypoxic resp failure  ?Assessment & Plan:  ?Status epilepticus with hx of seizure disorder in setting of Rt occipital oligodendroglioma s/p craniotomy. ?- neurology consulted, appreciate recommendations  ?- Home lamictal resumed; keppra discontinued ?- LTM monitoring per neurology  ? ?Hx of Schizoaffective disorder, ADD, Depression, Polysubstance abuse. ?- resume bupsar, zyprexa, zoloft, trazodone  ? ?Aspiration pneumonia  ?Did have aspiration event overnight 3/14 with CXR showing developing PNA. Also confirmed on POC [place] at  bedside. He is saturating well on room air. Does endorse productive cough  ?- Continue IV Unasyn day 2/5 ? ?Thrombocytopenia ?Likely in 2/2 acute illness. Stable ?- Trend CBC ? ?NAGMA ?- Trend BMP ? ?Best Practice (right click and  "Reselect all SmartList Selections" daily)  ? ?Diet/type: Regular consistency (see orders) ?DVT prophylaxis: LMWH ?GI prophylaxis: N/A ?Lines: N/A ?Foley:  N/A ?Code Status:  full code ?Last date of multidisciplinary goals of care discussion [3/15 discussed with family at bedside] ? ?Labs   ?CBC: ?Recent Labs  ?Lab 05/22/21 ?1543 05/22/21 ?1545 05/22/21 ?1616 05/23/21 ?0158 05/24/21 ?1610 05/25/21 ?9604 05/26/21 ?0321  ?WBC 10.5  --   --  6.8 6.8 8.7 7.3  ?HGB 18.4*   < > 16.7 14.2 14.2 14.0 14.4  ?HCT 54.9*   < > 49.0 38.9* 39.1 39.9 39.3  ?MCV 104.8*  --   --  95.8 97.0 99.5 95.9  ?PLT 241  --   --  138* 124* 121* 127*  ? < > = values in this interval not displayed.  ? ? ?Basic Metabolic Panel: ?Recent Labs  ?Lab 05/22/21 ?1543 05/22/21 ?1545 05/22/21 ?1616 05/23/21 ?0158 05/24/21 ?5409 05/25/21 ?8119 05/26/21 ?0321  ?NA 136 135 133* 135 140 140 137  ?K 4.4 4.5 4.4 3.4* 2.9* 3.5 3.8  ?CL 98 106  --  109 111 112* 104  ?CO2 <7*  --   --  18* 21* 19* 21*  ?GLUCOSE 116* 112*  --  111* 115* 127* 85  ?BUN 15 16  --  11 <5* 5* <5*  ?CREATININE 1.42* 1.10  --  0.83 0.72 0.68 0.65  ?CALCIUM 9.5  --   --  8.1* 8.3* 8.6* 8.8*  ?MG  --   --   --  2.3 1.8 1.9  --   ?PHOS  --   --   --  2.7 2.9 2.3*  --   ? ?GFR: ?Estimated Creatinine Clearance: 118.9 mL/min (by C-G formula based on SCr of 0.65 mg/dL). ?Recent Labs  ?Lab 05/23/21 ?0158 05/24/21 ?1478 05/25/21 ?0454 05/26/21 ?0321  ?WBC 6.8 6.8 8.7 7.3  ? ? ?Liver Function Tests: ?Recent Labs  ?Lab 05/22/21 ?1543 05/23/21 ?0158  ?AST 94* 39  ?ALT 69* 42  ?ALKPHOS 75 47  ?BILITOT 1.4* 1.0  ?PROT 7.5 5.2*  ?ALBUMIN 4.6 3.1*  ? ?No results for input(s): LIPASE, AMYLASE in the last 168 hours. ?No results for input(s): AMMONIA in the last 168 hours. ? ?ABG ?   ?Component Value Date/Time  ? PHART 7.219 (L) 05/22/2021 1616  ? PCO2ART 33.7 05/22/2021 1616  ? PO2ART 352 (H) 05/22/2021 1616  ? HCO3 13.8 (L) 05/22/2021 1616  ? TCO2 15 (L) 05/22/2021 1616  ? ACIDBASEDEF 13.0 (H) 05/22/2021  1616  ? O2SAT 100 05/22/2021 1616  ?  ? ?Coagulation Profile: ?No results for input(s): INR, PROTIME in the last 168 hours. ? ?Cardiac Enzymes: ?No results for input(s): CKTOTAL, CKMB, CKMBINDEX, TROPONINI in the last 168 hours. ? ?HbA1C: ?No results found for: HGBA1C ? ?CBG: ?Recent Labs  ?Lab 05/25/21 ?1214 05/25/21 ?1555 05/25/21 ?1928 05/25/21 ?2350 05/26/21 ?0315  ?GLUCAP 132* 119* 104* 104* 87  ? ? ?Critical care time: 35 minutes  ? ? ?

## 2021-05-26 NOTE — Progress Notes (Signed)
Physical Therapy Treatment ?Patient Details ?Name: Stephen Beard ?MRN: [id number] ?DOB: 12/16/1995 ?Today's Date: 05/26/2021 ? ? ?History of Present Illness Stephen Beard is a 26 y.o. male  with witnessed grand mal seizure at home after using crystal meth. Intubated for airway protection, extubated 05/25/21. EEG consistent with epilepsy and cortical dysfunction arising from R hemisphere, also moderate diffuse encephalopathy. 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 ? ?  ?PT Comments  ? ? Pt tolerates treatment well, ambulating for increased distances this session. Pt demonstrates impaired balance and benefits from UE support of hand hold or an assistive device to maintain balance. Pt also consistently drifting to the right despite PT cues to correct. Pt will benefit from further balance gait training in an effort to restore independence.   ?Recommendations for follow up therapy are one component of a multi-disciplinary discharge planning process, led by the attending physician.  Recommendations may be updated based on patient status, additional functional criteria and insurance authorization. ? ?Follow Up Recommendations ? Outpatient PT ?  ?  ?Assistance Recommended at Discharge Frequent or constant Supervision/Assistance  ?Patient can return home with the following Assistance with cooking/housework;A little help with walking and/or transfers;A little help with bathing/dressing/bathroom;Direct supervision/assist for medications management;Direct supervision/assist for financial management;Assist for transportation ?  ?Equipment Recommendations ? Rolling walker (2 wheels)  ?  ?Recommendations for Other Services   ? ? ?  ?Precautions / Restrictions Precautions ?Precautions: Fall;Other (comment) ?Precaution Comments: seizure ?Restrictions ?Weight Bearing Restrictions: No  ?   ? ?Mobility ? Bed Mobility ?Overal bed mobility: Needs Assistance ?Bed Mobility: Supine to Sit ?  ?  ?Supine to sit: Supervision ?  ?  ?  ?  ? ?Transfers ?Overall transfer level: Needs assistance ?Equipment used: Rolling walker (2 wheels), None ?Transfers: Sit to/from Stand ?Sit to Stand: Min guard ?  ?  ?  ?  ?  ?  ?  ? ?Ambulation/Gait ?Ambulation/Gait assistance: Supervision, Min guard ?Gait Distance (Feet): 500 Feet ?Assistive device: Rolling walker (2 wheels), 1 person hand held assist ?Gait Pattern/deviations: Step-through pattern, Drifts right/left ?Gait velocity: reduced ?Gait velocity interpretation: <1.8 ft/sec, indicate of risk for recurrent falls ?  ?General Gait Details: pt with minG and hand hold, supervision with walker. Pt consistently drifts to right side with walker, bumping into walls multiple times despite PT cues to correct ? ? ?Stairs ?  ?  ?  ?  ?  ? ? ?Wheelchair Mobility ?  ? ?Modified Rankin (Stroke Patients Only) ?  ? ? ?  ?Balance Overall balance assessment: Needs assistance ?Sitting-balance support: No upper extremity supported, Feet supported ?Sitting balance-Leahy Scale: Fair ?  ?  ?Standing balance support: No upper extremity supported, During functional activity ?Standing balance-Leahy Scale: Poor ?Standing balance comment: requires hand hold or minA to maintain standing balance ?  ?  ?  ?  ?  ?  ?  ?  ?  ?  ?  ?  ? ?  ?Cognition Arousal/Alertness: Awake/alert ?Behavior During Therapy: Impulsive ?Overall Cognitive Status: Impaired/Different from baseline ?Area of Impairment: Memory, Safety/judgement, Awareness, Problem solving ?  ?  ?  ?  ?  ?  ?  ?  ?  ?  ?Memory: Decreased short-term memory ?Following Commands: Follows multi-step commands inconsistently, Follows one step commands consistently ?Safety/Judgement: Decreased awareness of safety, Decreased awareness of deficits ?Awareness: Emergent ?Problem Solving: Slow processing, Difficulty sequencing, Requires verbal cues ?  ?  ?   ? ?  ?  Exercises   ? ?  ?General Comments General comments (skin integrity, edema, etc.): tachy into 110s during session, pt with multiple coughing spells during session. ?  ?  ? ?Pertinent Vitals/Pain Pain Assessment ?Pain Assessment: No/denies pain  ? ? ?Home Living   ?  ?  ?  ?  ?  ?  ?  ?  ?  ?   ?  ?Prior Function    ?  ?  ?   ? ?PT Goals (current goals can now be found in the care plan section) Acute Rehab PT Goals ?Patient Stated Goal: return home ?Progress towards PT goals: Progressing toward goals ? ?  ?Frequency ? ? ? Min 3X/week ? ? ? ?  ?PT Plan Current plan remains appropriate  ? ? ?Co-evaluation   ?  ?  ?  ?  ? ?  ?AM-PAC PT "6 Clicks" Mobility   ?Outcome Measure ? Help needed turning from your back to your side while in a flat bed without using bedrails?: A Little ?Help needed moving from lying on your back to sitting on the side of a flat bed without using bedrails?: A Little ?Help needed moving to and from a bed to a chair (including a wheelchair)?: A Little ?Help needed standing up from a chair using your arms (e.g., wheelchair or bedside chair)?: A Little ?Help needed to walk in hospital room?: A Little ?Help needed climbing 3-5 steps with a railing? : A Lot ?6 Click Score: 17 ? ?  ?End of Session   ?Activity Tolerance: Patient tolerated treatment well ?Patient left: in chair;with call bell/phone within reach;with chair alarm set ?Nurse Communication: Mobility status ?PT Visit Diagnosis: Unsteadiness on feet (R26.81);History of falling (Z91.81) ?  ? ? ?Time: 8891-6945 ?PT Time Calculation (min) (ACUTE ONLY): 36 min ? ?Charges:  $Gait Training: 23-37 mins          ?          ? ?Zenaida Niece, PT, DPT ?Acute Rehabilitation ?Pager: [phone] ?Office [phone] ? ? ? ?Zenaida Niece ?05/26/2021, 4:32 PM ? ?

## 2021-05-26 NOTE — Procedures (Addendum)
Patient Name: Stephen Beard  ?MRN: [id number]  ?Epilepsy Attending: Lora Havens  ?Referring Physician/Provider: Amie Portland, M ?Duration: 05/25/2021 1735 to 05/26/2021 0450 ?  ?Patient history: 26yo m presented with seizure like episodes. EEG to evaluate for seizure ?  ?Level of alertness: awake, asleep ?  ?AEDs during EEG study: LTG, LEV ?  ?Technical aspects: This EEG study was done with scalp electrodes positioned according to the 10-20 International system of electrode placement. Electrical activity was acquired at a sampling rate of '500Hz'$  and reviewed with a high frequency filter of '70Hz'$  and a low frequency filter of '1Hz'$ . EEG data were recorded continuously and digitally stored.  ?  ?Description: During awake state, no clear posterior dominant rhythm was seen. Sleep was characterized by sleep spindles (12 to 14 Hz), maximal frontocentral region. EEG showed continuous generalized and maximal right posterior quadrant 6-9 hz theta-alpha activity admixed with intermittent generalized 2-'3hz'$  delta slowing. There were also spikes in right hemisphere, maximal right posterior quadrant at '1Hz'$ . Hyperventilation and photic stimulation were not performed.    ? ?Of note, parts of study were technically difficult due to significant electrode artifact after 0450 on 05/26/2021 ?  ?ABNORMALITY ?- Spike, right hemisphere, maximal right posterior quadrant ?- Continuous slow, generalized and maximal right posterior quadrant ?  ?IMPRESSION: ?This study is consistent with patient's history of epilepsy and cortical dysfunction arising from  right hemisphere, maximal right posterior quadrant secondary to underlying craniotomy.  Additionally there is moderate diffuse encephalopathy, nonspecific etiology. No definite seizures were seen throughout the recording. ?   ?Lora Havens  ?

## 2021-05-26 NOTE — Progress Notes (Addendum)
Subjective: No seizures overnight ? ?ROS: negative except above ? ?Examination ? ?Vital signs in last 24 hours: ?Temp:  [97.6 ?F (36.4 ?C)-99.8 ?F (37.7 ?C)] (P) 98.6 ?F (37 ?C) (03/15 0800) ?Pulse Rate:  [49-92] 73 (03/15 0800) ?Resp:  [10-29] 19 (03/15 1000) ?BP: (104-135)/(64-96) 111/64 (03/15 1000) ?SpO2:  [91 %-96 %] 95 % (03/15 0800) ?Weight:  [60.1 kg] 60.1 kg (03/15 0500) ? ?General: lying in bed, NAD ?CVS: pulse-normal rate and rhythm ?Extremities: normal  ?Neuro: Aox3, follows commands, PERRLA, EOMI, no facial asymmetry, antigravity strength in all 4 extremities ?  ?Basic Metabolic Panel: ?Recent Labs  ?Lab 05/22/21 ?1543 05/22/21 ?1545 05/22/21 ?1616 05/23/21 ?0158 05/24/21 ?1027 05/25/21 ?2536 05/26/21 ?0321  ?NA 136 135 133* 135 140 140 137  ?K 4.4 4.5 4.4 3.4* 2.9* 3.5 3.8  ?CL 98 106  --  109 111 112* 104  ?CO2 <7*  --   --  18* 21* 19* 21*  ?GLUCOSE 116* 112*  --  111* 115* 127* 85  ?BUN 15 16  --  11 <5* 5* <5*  ?CREATININE 1.42* 1.10  --  0.83 0.72 0.68 0.65  ?CALCIUM 9.5  --   --  8.1* 8.3* 8.6* 8.8*  ?MG  --   --   --  2.3 1.8 1.9  --   ?PHOS  --   --   --  2.7 2.9 2.3*  --   ? ? ?CBC: ?Recent Labs  ?Lab 05/22/21 ?1543 05/22/21 ?1545 05/22/21 ?1616 05/23/21 ?0158 05/24/21 ?6440 05/25/21 ?3474 05/26/21 ?0321  ?WBC 10.5  --   --  6.8 6.8 8.7 7.3  ?HGB 18.4*   < > 16.7 14.2 14.2 14.0 14.4  ?HCT 54.9*   < > 49.0 38.9* 39.1 39.9 39.3  ?MCV 104.8*  --   --  95.8 97.0 99.5 95.9  ?PLT 241  --   --  138* 124* 121* 127*  ? < > = values in this interval not displayed.  ? ? ? ?Coagulation Studies: ?No results for input(s): LABPROT, INR in the last 72 hours. ? ?Imaging ?No new brain imaging overnight ?  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  ?Epilepsy with breakthrough seizure ?Substance use disorder ?Acute  encephalopathy, resolved ?- No definitive seizures overnight ?- Etiology of seizure: missing anti-seizure medications, using crystal meth ?  ?Recommendations ?-Continue lamotrigine 250 mg twice daily ?-DC LTM EEG as no seizures overnight.   ?-We will discontinue Keppra as no seizures overnight and seizures-in the setting of drug use, medication noncompliance ?-Counseled patient regarding medication compliance, avoiding drugs, seizure provoking factors including lack of sleep, alcohol use ?-As needed IV Ativan 2 mg for clinical seizure-like activity ?-Continue seizure precautions ?-Management of rest of comorbidities per primary team ?-Follow-up with Dr Mickeal Skinner in 2-3 months ? ?Seizure precautions: ?Per [facility] statutes, patients with seizures are not allowed to drive until they have been seizure-free for six months and cleared by a physician  ?  ?Use caution when using heavy equipment or power tools. Avoid working on ladders or at heights. Take showers instead of baths. Ensure the water temperature is not too high on the home water heater. Do not go swimming alone. Do not lock yourself in a room alone (i.e. bathroom). When caring for infants or small children, sit down when holding, feeding, or changing them to minimize  risk of injury to the child in the event you have a seizure. Maintain good sleep hygiene. Avoid alcohol.  ?  ?If patient has another seizure, call 911 and bring them back to the ED if: ?A.  The seizure lasts longer than 5 minutes.      ?B.  The patient doesn't wake shortly after the seizure or has new problems such as difficulty seeing, speaking or moving following the seizure ?C.  The patient was injured during the seizure ?D.  The patient has a temperature over 102 F (39C) ?E.  The patient vomited during the seizure and now is having trouble breathing ?   ?During the Seizure ?  ?- First, ensure adequate ventilation and place patients on the floor on their left side  ?Loosen clothing  around the neck and ensure the airway is patent. If the patient is clenching the teeth, do not force the mouth open with any object as this can cause severe damage ?- Remove all items from the surrounding that can be hazardous. The patient may be oblivious to what's happening and may not even know what he or she is doing. ?If the patient is confused and wandering, either gently guide him/her away and block access to outside areas ?- Reassure the individual and be comforting ?- Call 911. In most cases, the seizure ends before EMS arrives. However, there are cases when seizures may last over 3 to 5 minutes. Or the individual may have developed breathing difficulties or severe injuries. If a pregnant patient or a person with diabetes develops a seizure, it is prudent to call an ambulance. ?- Finally, if the patient does not regain full consciousness, then call EMS. Most patients will remain confused for about 45 to 90 minutes after a seizure, so you must use judgment in calling for help. ?  ? After the Seizure (Postictal Stage) ?  ?After a seizure, most patients experience confusion, fatigue, muscle pain and/or a headache. Thus, one should permit the individual to sleep. For the next few days, reassurance is essential. Being calm and helping reorient the person is also of importance. ?  ?Most seizures are painless and end spontaneously. Seizures are not harmful to others but can lead to complications such as stress on the lungs, brain and the heart. Individuals with prior lung problems may develop labored breathing and respiratory distress.  ? ? ?I have spent a total of 35 minutes with the patient reviewing hospital notes,  test results, labs and examining the patient as well as establishing an assessment and plan that was discussed personally with the patient.  > 50% of time was spent in direct patient care. ?  ?Thank you for allowing [place] to participate in the care of this patient.  Neurology will sign off.  Please  call us for any further questions. ? ? ?Zeb Comfort ?Epilepsy ?[facility] ?For questions after 5pm please refer to AMION to reach the Neurologist on call ? ?

## 2021-05-26 NOTE — Progress Notes (Signed)
Discontinued cEEG study.  Notified [facility] monitoring.  No skin breakdown observed. 

## 2021-05-26 NOTE — Progress Notes (Signed)
Patient admitted to unit via 4N-ICU. Patient Aox4, vitals WNL, and focused assessment completed.  ? ? ?Oriented patient to room, call light in reach, grip socks on, bed in lowest postion and bed alarm on. Mother is at bedside.  ?

## 2021-05-26 NOTE — Progress Notes (Signed)
Nutrition Follow-up ? ?DOCUMENTATION CODES:  ? ?Not applicable ? ?INTERVENTION:  ? ?Encourage PO intake of Regular diet ? ?Ensure Enlive po BID, each supplement provides 350 kcal and 20 grams of protein. ? ? ?NUTRITION DIAGNOSIS:  ? ?Inadequate oral intake related to acute illness as evidenced by meal completion < 50%. ?Ongoing.  ? ?GOAL:  ? ?Patient will meet greater than or equal to 90% of their needs ?Progressing.  ? ?MONITOR:  ? ?PO intake, Supplement acceptance ? ?REASON FOR ASSESSMENT:  ? ?Ventilator, Consult ?Enteral/tube feeding initiation and management ? ?ASSESSMENT:  ? ?26 yo male admitted S/P witnessed seizure at home. Urine tox screen positive for amphetamines and benzodiazepines on admission. 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 ? ?Pt discussed during ICU rounds and with RN. Pt with neurologist. Per RN pt now eating. Per documentation po intake 50% of meals 3/14.  ? ?3/14 extubated, aspiration  ?  ?Medications reviewed and include: colace, miralax ?Labs reviewed: Phosphorus: 2.3 ? ? ?Diet Order:   ?Diet Order   ? ?       ?  Diet regular Room service appropriate? Yes with Assist; Fluid consistency: Thin  Diet effective now       ?  ? ?  ?  ? ?  ? ? ?EDUCATION NEEDS:  ? ?No education needs have been identified at this time ? ?Skin:  Skin Assessment: Reviewed RN Assessment ? ?Last BM:  3/13 small ? ?Height:  ? ?Ht Readings from Last 1 Encounters:  ?05/22/21 '5\' 7"'$  (1.702 m)  ? ? ?Weight:  ? ?Wt Readings from Last 1 Encounters:  ?05/26/21 60.1 kg  ? ? ?BMI:  Body mass index is 20.75 kg/m?. ? ?Estimated Nutritional Needs:  ? ?Kcal:  1900-2100 ? ?Protein:  95-110 gm ? ?Fluid:  >/= 2 L ? ? ?Lockie Pares., RD, LDN, CNSC ?See AMiON for contact information  ? ?

## 2021-05-27 ENCOUNTER — Other Ambulatory Visit (HOSPITAL_COMMUNITY): Payer: Self-pay

## 2021-05-27 ENCOUNTER — Encounter: Payer: Self-pay | Admitting: Internal Medicine

## 2021-05-27 LAB — BASIC METABOLIC PANEL
Anion gap: 13 (ref 5–15)
BUN: 8 mg/dL (ref 6–20)
CO2: 22 mmol/L (ref 22–32)
Calcium: 8.8 mg/dL — ABNORMAL LOW (ref 8.9–10.3)
Chloride: 105 mmol/L (ref 98–111)
Creatinine, Ser: 0.72 mg/dL (ref 0.61–1.24)
GFR, Estimated: >60 mL/min (ref >60)
Glucose, Bld: 92 mg/dL (ref 70–99)
Potassium: 3.6 mmol/L (ref 3.5–5.1)
Sodium: 140 mmol/L (ref 135–145)

## 2021-05-27 LAB — CBC
HCT: 39.8 % (ref 39.0–52.0)
Hemoglobin: 14.4 g/dL (ref 13.0–17.0)
MCH: 34.6 pg — ABNORMAL HIGH (ref 26.0–34.0)
MCHC: 36.2 g/dL — ABNORMAL HIGH (ref 30.0–36.0)
MCV: 95.7 fL (ref 80.0–100.0)
Platelets: 132 10*3/uL — ABNORMAL LOW (ref 150–400)
RBC: 4.16 MIL/uL — ABNORMAL LOW (ref 4.22–5.81)
RDW: 11.2 % — ABNORMAL LOW (ref 11.5–15.5)
WBC: 5 10*3/uL (ref 4.0–10.5)
nRBC: 0 % (ref 0.0–0.2)

## 2021-05-27 MED ORDER — AMOXICILLIN-POT CLAVULANATE 875-125 MG PO TABS
1.0000 | ORAL_TABLET | Freq: Two times a day (BID) | ORAL | 0 refills | Status: AC
  Filled 2021-05-27: qty 4, 2d supply, fill #0

## 2021-05-27 MED ORDER — LAMOTRIGINE 100 MG PO TABS
250.0000 mg | ORAL_TABLET | Freq: Two times a day (BID) | ORAL | 0 refills | Status: DC
  Filled 2021-05-27: qty 60, 30d supply, fill #0

## 2021-05-27 MED ORDER — LAMOTRIGINE 150 MG PO TABS
ORAL_TABLET | ORAL | 0 refills | Status: DC
  Filled 2021-05-27: qty 60, 30d supply, fill #0

## 2021-05-27 NOTE — Discharge Summary (Addendum)
? ?Name: Stephen Beard ?MRN: [id number] ?DOB: Mar 07, 1996 26 y.o. ?PCP: [facility] ? ?Date of Admission: 05/22/2021  3:31 PM ?Date of Discharge: 05/27/2021 ?Attending Physician: Dr.  Jacky Kindle ? ?Discharge Diagnosis: ?Status epilepticus with history of seizure disorder in the setting of brain tumor ?Schizoaffective disorder/ADD/depression ?Aspiration pneumonia ?Acute hypoxic respiratory failure, resolved ?Polysubstance abuse ?Thrombocytopenia ?Non-anion gap metabolic acidosis ?Hypokalemia/hypocalcemia ?AKI due to dehydration, resolved ? ?Discharge Medications: ?Allergies as of 05/27/2021   ?No Known Allergies ?  ? ?  ?Medication List  ?  ? ?TAKE these medications   ? ?amoxicillin-clavulanate 875-125 MG tablet ?Commonly known as: Augmentin ?Take 1 tablet by mouth 2 (two) times daily for 2 days. ?  ?busPIRone 10 MG tablet ?Commonly known as: BUSPAR ?Take 1 tablet (10 mg total) by mouth 2 (two) times daily. ?  ?lamoTRIgine 25 MG tablet ?Commonly known as: LAMICTAL ?Take 10 tablets (250 mg total) by mouth 2 (two) times daily. ?What changed:  ?medication strength ?how much to take ?how to take this ?when to take this ?additional instructions ?Another medication with the same name was removed. Continue taking this medication, and follow the directions you see here. ?  ?OLANZapine 20 MG tablet ?Commonly known as: ZYPREXA ?Take 1 tablet by mouth every evening. ?  ?sertraline 50 MG tablet ?Commonly known as: ZOLOFT ?Take 1 tablet by mouth once daily ?  ?traZODone 100 MG tablet ?Commonly known as: DESYREL ?Take 1 tablet (100 mg total) by mouth at bedtime as needed for sleep. ?  ? ?  ? ? ?Disposition and follow-up:   ?Mr.Stephen Beard was discharged from [facility] in Stable condition.  At the hospital follow up visit please address: ? ?1.  Follow-up: ?a.Status epilepticus with hx of seizure disorder in setting of brain tumor - please ensure patient is taking lamictal  as prescribed. Follow up with Dr Mickeal Skinner in 2-3 months. Seizure precautions and driving restrictions provided and reinforced with patient.  ?  ? b. Aspiration pneumonia - please ensure patient has completed course of abx  ? ? c. Thrombocytopenia - improving on discharge; need follow up CBC ? ? ?2.  Labs / imaging needed at time of follow-up: CBC ? ?3.  Pending labs/ test needing follow-up: None ? ?4.  Medication Changes ? Recommended to continue lamotrigine '250mg'$  twice daily ?Continue Augmentin 875-'125mg'$  twice daily for 2 days to complete tx for aspiration pneumonia ? ?Follow-up Appointments: ? Follow-up Information   ? ? [facility]. Schedule an appointment as soon as possible for a visit in 2 week(s).   ?Specialty: Family Medicine ?Contact information: ?[address] ?[address] ?[place] [address] ?[phone] ? ? ?  ?  ? ? [name], MD Follow up in 3 month(s).   ?Specialties: Psychiatry, Neurology, Oncology ?Contact information: ?[address] ?[place] [address] ?[phone] ? ? ?  ?  ? ?  ?  ? ?  ? ? ?Hospital Course by problem list:  ?Status epilepticus with hx of seizure disorder in setting of Rt occipital oligodendroglioma s/p craniotomy -  ?Patient presented following seizure at home with ongoing seizure activity in the ED requiring intubation for airway protection. This was reported to be in setting of substance use and medication noncompliance prior to arrival. Patient was started on diprivan and versed infusions initially. Patient noted to have multiple seizure-like events during hospitalization without correlating ltEEG changes. Patient's home lamictal resumed and  Keppra titrated down. Patient was extubated on 3/14. He was able to work with physical therapy and occupational therapy following and recommended for outpatient PT/OT. Neurology recommended for continued home lamictal on discharge with seizure precautions/restrictions.  Patient counseled against substance use.  ? ?Aspiration pneumonia -  ?Patient experienced aspiration event overnight 3/14 with CXR showing developing PNA. Also confirmed on POC [place] at bedside. He was started on IV Unasyn and received three days of antibiotics. Patient is on room air. Recommended for two more days of oral antibiotics as outpatient to complete five day course.  ? ?Thrombocytopenia-  ?Suspected to be in setting of acute illness. This was improving on discharge. No active bleeding. Recommend follow up CBC  ? ?Hx of schizoaffective disorder, depression, ADD -  ?Recommended to continue home buspar, zyprexa, zoloft and trazodone.  ?Patient to follow up with PCP.  ? ?Discharge Subjective: ?Patient reports feeling well and no further seizure activity. He has been able to participate with PT and OT. Recommend for outpatient follow up. Seizure precautions and restrictions reinforced. Patient and mother express understanding.  ? ?Discharge Exam:   ?BP 110/73 (BP Location: Right Arm)   Pulse 68   Temp 99.2 ?F (37.3 ?C) (Oral)   Resp (!) 24   Ht '5\' 7"'$  (1.702 m)   Wt 60.1 kg   SpO2 93%   BMI 20.75 kg/m?  ?Constitutional: well-appearing young male sitting in in bedside chair, in no acute distress ?HENT: normocephalic atraumatic, mucous membranes moist ?Eyes: conjunctiva non-erythematous ?Neck: supple ?Cardiovascular: regular rate and rhythm, no m/r/g ?Pulmonary/Chest: normal work of breathing on room air, lungs clear to auscultation bilaterally ?Abdominal: soft, non-tender, non-distended ?MSK: normal bulk and tone ?Neurological: alert & oriented x 3, 5/5 strength in bilateral upper and lower extremities, normal gait ?Skin: warm and dry ?Psych: Normal mood and affect.   ? ?Pertinent Labs, Studies, and Procedures:  ?CBC Latest Ref Rng & Units 05/27/2021 05/26/2021 05/25/2021  ?WBC 4.0 - 10.5 K/uL 5.0 7.3 8.7  ?Hemoglobin 13.0 - 17.0 g/dL 14.4 14.4 14.0  ?Hematocrit 39.0 - 52.0 % 39.8 39.3 39.9  ?Platelets 150 -  400 K/uL 132(L) 127(L) 121(L)  ? ? ?CMP Latest Ref Rng & Units 05/27/2021 05/26/2021 05/25/2021  ?Glucose 70 - 99 mg/dL 92 85 127(H)  ?BUN 6 - 20 mg/dL 8 <5(L) 5(L)  ?Creatinine 0.61 - 1.24 mg/dL 0.72 0.65 0.68  ?Sodium 135 - 145 mmol/L 140 137 140  ?Potassium 3.5 - 5.1 mmol/L 3.6 3.8 3.5  ?Chloride 98 - 111 mmol/L 105 104 112(H)  ?CO2 22 - 32 mmol/L 22 21(L) 19(L)  ?Calcium 8.9 - 10.3 mg/dL 8.8(L) 8.8(L) 8.6(L)  ?Total Protein 6.5 - 8.1 g/dL - - -  ?Total Bilirubin 0.3 - 1.2 mg/dL - - -  ?Alkaline Phos 38 - 126 U/L - - -  ?AST 15 - 41 U/L - - -  ?ALT 0 - 44 U/L - - -  ? ? ?CT HEAD WO CONTRAST (5MM) ? ?Result Date: 05/23/2021 ?CLINICAL DATA:  History of high-grade glioma of the right occipital lobe, resected in April 2021. Seizure disorder. EXAM: CT HEAD WITHOUT CONTRAST TECHNIQUE: Contiguous axial images were obtained from the base of the skull through the vertex without intravenous contrast. RADIATION DOSE REDUCTION: This exam was performed according to the departmental dose-optimization program which includes automated exposure control, adjustment of the mA and/or kV according to patient size and/or use of iterative reconstruction technique. COMPARISON:  05/22/2021 FINDINGS: Despite efforts by the technologist and patient,  motion artifact is present on today's exam and could not be eliminated. This reduces exam sensitivity and specificity. Brain: Prior right occipital craniotomy and tumor resection site, with chronic dilation of the occipital horn of the right lateral ventricle and of the temporal horn of the right lateral ventricle. Stable chronic prominence of low-density right extra-axial fluid along the frontoparietal region, no high density component to suggest acute subdural hematoma. Stable small calcifications noted along the upper anterior resection margin. No new or acute findings. The brainstem and cerebellum appear normal. Basilar cisterns unremarkable. Vascular: Unremarkable Skull: Left occipital  craniotomy. Sinuses/Orbits: Chronic ethmoid and bilateral sphenoid sinusitis. Other: No supplemental non-categorized findings. IMPRESSION: 1. Chronic postoperative findings in the right occipital lobe with stable chronic

## 2021-05-27 NOTE — TOC Initial Note (Signed)
Transition of Care (TOC) - Initial/Assessment Note  ? ? ?Patient Details  ?Name: Stephen Beard ?MRN: [id number] ?Date of Birth: 08/04/1995 ? ?Transition of Care (TOC) CM/SW Contact:    ?Angelita Ingles, RN ?Phone Number: ?05/27/2021, 12:10 PM ? ?Clinical Narrative:                 ?TOC consulted for patient with DME needs and substance abuse counseling. Patient is basically homeless because he was living in [place] with grandparents but now will reside in [place] with his mom. Mom has been made aware of ambulatory referral for home health. Mother states that she is in the process of getting patients medicaid card and appointments straightened out. Patient states that he has a plan to be compliant and wishes to cut back on alcohol use. Rolling walker to be delivered. Currently there are no other needs. TOC will follow for disposition needs.  ? ?Expected Discharge Plan: Home/Self Care (outpatient follow up) ?Barriers to Discharge: No Barriers Identified ? ? ?Patient Goals and CMS Choice ?Patient states their goals for this hospitalization and ongoing recovery are:: Ready to go home ?  ?Choice offered to / list presented to : NA ? ?Expected Discharge Plan and Services ?Expected Discharge Plan: Home/Self Care (outpatient follow up) ?In-house Referral: NA ?Discharge Planning Services: CM Consult ?Post Acute Care Choice: NA ?Living arrangements for the past 2 months: Apartment ?Expected Discharge Date: 05/27/21               ?DME Arranged: Walker rolling ?DME Agency: [employer] ?Date DME Agency Contacted: 05/27/21 ?Time DME Agency Contacted: 0938 ?Representative spoke with at DME Agency: Brenton Grills ?HH Arranged:  (outpatient therapy) ?  ?  ?  ?  ? ?Prior Living Arrangements/Services ?Living arrangements for the past 2 months: Apartment ?Lives with:: Parents ?Patient language and need for interpreter reviewed:: Yes ?Do you feel safe going back to the place where you live?: Yes      ?Need for Family  Participation in Patient Care: Yes (Comment) ?Care giver support system in place?: Yes (comment) ?Current home services:  (n/a) ?Criminal Activity/Legal Involvement Pertinent to Current Situation/Hospitalization: No - Comment as needed ? ?Activities of Daily Living ?  ?  ? ?Permission Sought/Granted ?  ?Permission granted to share information with : No ?   ?   ?   ?   ? ?Emotional Assessment ?Appearance:: Appears stated age ?Attitude/Demeanor/Rapport: Gracious ?Affect (typically observed): Accepting, Pleasant ?Orientation: : Oriented to Self, Oriented to Place, Oriented to  Time, Oriented to Situation ?Alcohol / Substance Use: Alcohol Use ?Psych Involvement: No (comment) ? ?Admission diagnosis:  Respiratory failure ([place]) [J96.90] ?Status epilepticus ([place]) [H82.993] ?Brain tumor [facility]) [D49.6] ?Patient Active Problem List  ? Diagnosis Date Noted  ? Status epilepticus ([place]) 05/22/2021  ? Alcohol abuse 01/11/2020  ? 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[place]) 01/07/2020  ? Intentional overdose of drug in tablet form ([place]) 01/05/2020  ? Drug overdose, multiple drugs, intentional self-harm, initial encounter ([place]) 01/04/2020  ? Schizoaffective disorder, depressive type ([place]) 01/01/2020  ? Generalized anxiety disorder 01/01/2020  ? Severe episode of recurrent major depressive disorder, with psychotic features ([place]) 01/01/2020  ? 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 Focal seizures ([place]) 11/07/2019  ? Auditory hallucinations 07/30/2019  ? Goals of care, counseling/discussion 07/19/2019  ? 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 Anaplastic oligodendroglioma, 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[place]) 07/01/2019  ? Brain mass 06/30/2019  ? Attention deficit  hyperactivity disorder (ADHD), combined type 03/23/2019  ? Mild episode of recurrent major depressive disorder ([place]) 03/23/2019  ? Tobacco abuse 10/04/2012  ? Allergic rhinitis 08/18/2012  ? ?PCP:  [facility] ?Pharmacy:   ?[place] - [address] ?[address] ?[place] [address] ?Phone: [phone] Fax: [phone] ? ?[facility] ?[address] ?[place] [address] ?Phone: [phone] Fax: [phone] ? ?[facility] [place],  - [address] AT [facility] [address] ?[address] [place] ?[place] [address] ?Phone: [phone] Fax: [phone] ? ?[id number] [address]?[place]?Phone: [phone]: [phone]? ?[facility] ?[address]?Phone: [phone]: [phone] ? ? ? ? ?Social Determinants of Health (SDOH) Interventions ?  ? ?Readmission Risk Interventions ?Readmission Risk Prevention Plan 05/27/2021  ?Transportation Screening Complete  ?PCP or Specialist Appt within 3-5 Days Complete  ?[facility] Consult Complete  ?Social Work Consult for RToombsPlanning/Counseling Complete  ?Palliative Care Screening Not Applicable  ?Medication Review (Press photographer Complete  ?Some recent data might be hidden  ? ? ? ?

## 2021-05-27 NOTE — TOC CAGE-AID Note (Signed)
Transition of Care (TOC) - CAGE-AID Screening ? ? ?Patient Details  ?Name: Stephen Beard ?MRN: [id number] ?Date of Birth: 1996/02/10 ? ?Transition of Care (TOC) CM/SW Contact:    ?Angelita Ingles, RN ?Phone Number:[phone] ? ?05/27/2021, 12:04 PM ? ? ?Clinical Narrative: ?Substance abuse screen completed and resources given.  ? ? ?CAGE-AID Screening: ?  ? ?Have You Ever Felt You Ought to Cut Down on Your Drinking or Drug Use?: Yes ?Have People Annoyed You By Critizing Your Drinking Or Drug Use?: Yes ?Have You Felt Bad Or Guilty About Your Drinking Or Drug Use?: Yes ?Have You Ever Had a Drink or Used Drugs First Thing In The Morning to Steady Your Nerves or to Get Rid of a Hangover?: Yes ?CAGE-AID Score: 4 ? ?Substance Abuse Education Offered: Yes ? ?Substance abuse interventions: Educational Materials ? ? ? ? ? ? ?

## 2021-05-27 NOTE — Progress Notes (Signed)
Mobility Specialist: Progress Note ? ? 05/27/21 1202  ?Mobility  ?Activity Ambulated with assistance in hallway  ?Level of Assistance Minimal assist, patient does 75% or more  ?Assistive Device Front wheel walker  ?Distance Ambulated (ft) 260 ft  ?Activity Response Tolerated well  ?$Mobility charge 1 Mobility  ? ?Pt received in chair and agreeable to ambulation. C/o some soreness in LLE during ambulation, otherwise asymptomatic. Pt back to chair after session with call bell and phone in reach. Family present in the room.  ? ?Stephen Beard Stephen Beard ?Mobility Specialist ?Mobility Specialist [place]: [phone] ?Mobility Specialist [place]: [phone] ? ?

## 2021-05-27 NOTE — Progress Notes (Signed)
Physical Therapy Treatment ?Patient Details ?Name: Stephen Beard ?MRN: [id number] ?DOB: [birth year] ?Today's Date: 05/27/2021 ? ? ?History of Present Illness Stephen Beard is a 26 y.o. male  with witnessed grand mal seizure at home after using crystal meth. Intubated for airway protection, extubated 05/25/21. EEG consistent with epilepsy and cortical dysfunction arising from R hemisphere, also moderate diffuse encephalopathy. 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 ? ?  ?PT Comments  ? ? Pt admitted with above diagnosis. Pt continues to need assist at times due to decr balance without device.  Pt needs supervision with RW and mom is ready to take pt home.  Will need Outpt PT f/u for progression of balance training.  Pt and mom agree.   Pt currently with functional limitations due to balance and endurance deficits. Pt will benefit from skilled PT to increase their independence and safety with mobility to allow discharge to the venue listed below.      ?Recommendations for follow up therapy are one component of a multi-disciplinary discharge planning process, led by the attending physician.  Recommendations may be updated based on patient status, additional functional criteria and insurance authorization. ? ?Follow Up Recommendations ? Outpatient PT ?  ?  ?Assistance Recommended at Discharge Frequent or constant Supervision/Assistance  ?Patient can return home with the following Assistance with cooking/housework;A little help with walking and/or transfers;A little help with bathing/dressing/bathroom;Direct supervision/assist for medications management;Direct supervision/assist for financial management;Assist for transportation ?  ?Equipment Recommendations ? Rolling walker (2 wheels)  ?  ?Recommendations for Other Services OT consult ? ? ?  ?Precautions / Restrictions  Precautions ?Precautions: Fall ?Precaution Comments: seizure ?Restrictions ?Weight Bearing Restrictions: No  ?  ? ?Mobility ? Bed Mobility ?Overal bed mobility: Modified Independent ?Bed Mobility: Supine to Sit ?  ?  ?Supine to sit: Supervision ?Sit to supine: Supervision ?  ?General bed mobility comments: Did not need assist ?  ? ?Transfers ?Overall transfer level: Needs assistance ?Equipment used: Rolling walker (2 wheels), None ?Transfers: Sit to/from Stand ?Sit to Stand: Supervision ?  ?  ?  ?  ?  ?General transfer comment: No assist needed to stand ?  ? ?Ambulation/Gait ?Ambulation/Gait assistance: Min guard ?Gait Distance (Feet): 650 Feet ?Assistive device: Rolling walker (2 wheels), None ?Gait Pattern/deviations: Step-through pattern ?Gait velocity: reduced ?Gait velocity interpretation: <1.8 ft/sec, indicate of risk for recurrent falls ?  ?General Gait Details: supervision with walker. Min guard assist when pt doesnt have device.  Pt with overall fair balance without device and worked on challenges to balance. Aware that he will be safest using RW initially on d/c. ? ? ?Stairs ?  ?  ?  ?  ?  ? ? ?Wheelchair Mobility ?  ? ?Modified Rankin (Stroke Patients Only) ?  ? ? ?  ?Balance Overall balance assessment: Needs assistance ?Sitting-balance support: No upper extremity supported, Feet supported ?Sitting balance-Leahy Scale: Good ?  ?  ?Standing balance support: No upper extremity supported, Bilateral upper extremity supported ?Standing balance-Leahy Scale: Fair ?Standing balance comment: can stand statically without RW ?Single Leg Stance - Right Leg: 8 ?  ?Tandem Stance - Right Leg: 5 ?  ?Rhomberg - Eyes Opened: 30 ?  ?  ?  ?Standardized Balance Assessment ?Standardized Balance Assessment : Dynamic Gait Index ?  ?Dynamic Gait Index ?Level Surface: Normal ?Change in Gait Speed: Normal ?Gait with Horizontal Head Turns: Mild Impairment ?Gait with Vertical Head  Turns: Mild Impairment ?Gait and Pivot Turn: Mild  Impairment ?Step Over Obstacle: Mild Impairment ?Step Around Obstacles: Normal ?Steps: Moderate Impairment ?Total Score: 18 ?  ? ?  ?Cognition Arousal/Alertness: Awake/alert ?Behavior During Therapy: Impulsive ?Overall Cognitive Status: History of cognitive impairments - at baseline ?Area of Impairment: Following commands, Awareness, Problem solving ?  ?  ?  ?  ?  ?  ?  ?  ?Orientation Level: Disoriented to, Situation, Time ?Current Attention Level: Sustained ?Memory: Decreased short-term memory ?Following Commands: Follows multi-step commands inconsistently, Follows one step commands consistently ?Safety/Judgement: Decreased awareness of safety, Decreased awareness of deficits ?Awareness: Emergent ?Problem Solving: Slow processing, Requires verbal cues ?General Comments: Per mom, he is close to his baseline. ?  ?  ? ?  ?Exercises Other Exercises ?Other Exercises: wall squats x 10 ? ?  ?General Comments General comments (skin integrity, edema, etc.): VSS ?  ?  ? ?Pertinent Vitals/Pain Pain Assessment ?Pain Assessment: No/denies pain  ? ? ?Home Living Family/patient expects to be discharged to:: Private residence ?Living Arrangements: Parent ?Available Help at Discharge: Family;Available 24 hours/day ?Type of Home: Apartment ?Home Access: Level entry ?  ?  ?  ?Home Layout: One level ?Home Equipment: None ?   ?  ?Prior Function    ?  ?  ?   ? ?PT Goals (current goals can now be found in the care plan section) Acute Rehab PT Goals ?Patient Stated Goal: return home ?Progress towards PT goals: Progressing toward goals ? ?  ?Frequency ? ? ? Min 3X/week ? ? ? ?  ?PT Plan Current plan remains appropriate  ? ? ?Co-evaluation   ?  ?  ?  ?  ? ?  ?AM-PAC PT "6 Clicks" Mobility   ?Outcome Measure ? Help needed turning from your back to your side while in a flat bed without using bedrails?: A Little ?Help needed moving from lying on your back to sitting on the side of a flat bed without using bedrails?: A Little ?Help needed  moving to and from a bed to a chair (including a wheelchair)?: A Little ?Help needed standing up from a chair using your arms (e.g., wheelchair or bedside chair)?: A Little ?Help needed to walk in hospital room?: A Little ?Help needed climbing 3-5 steps with a railing? : A Little ?6 Click Score: 18 ? ?  ?End of Session Equipment Utilized During Treatment: Gait belt ?Activity Tolerance: Patient tolerated treatment well ?Patient left: in chair;with call bell/phone within reach;with chair alarm set;with family/visitor present ?Nurse Communication: Mobility status ?PT Visit Diagnosis: Unsteadiness on feet (R26.81);History of falling (Z91.81) ?  ? ? ?Time: 8828-0034 ?PT Time Calculation (min) (ACUTE ONLY): 23 min ? ?Charges:  $Gait Training: 8-22 mins ?$Therapeutic Exercise: 8-22 mins          ?          ? ?Sherion Dooly M,PT ?Acute Rehab Services ?[phone] ?[phone] (pager)  ? ? ?Jianni Shelden F Majid Mccravy ?05/27/2021, 1:11 PM ? ?

## 2021-05-27 NOTE — TOC Transition Note (Signed)
Transition of Care (TOC) - CM/SW Discharge Note ? ? ?Patient Details  ?Name: Stephen Beard ?MRN: [id number] ?Date of Birth: [birth year] ? ?Transition of Care (TOC) CM/SW Contact:  ?Angelita Ingles, RN ?Phone Number:[phone] ? ?05/27/2021, 11:33 AM ? ? ?Clinical Narrative:    ?Patient discharging home. DME rolling walker to be delivered per [employer]. Ambulatory referral for PT has been set up. No other needs noted at this time. TOC will sign off.  ? ? ?  ?  ? ? ?Patient Goals and CMS Choice ?  ?  ?  ? ?Discharge Placement ?  ?           ?  ?  ?  ?  ? ?Discharge Plan and Services ?  ?  ?           ?  ?  ?  ?  ?  ?  ?  ?  ?  ?  ? ?Social Determinants of Health (SDOH) Interventions ?  ? ? ?Readmission Risk Interventions ?No flowsheet data found. ? ? ? ? ?

## 2021-05-27 NOTE — Evaluation (Signed)
Occupational Therapy Evaluation ?Patient Details ?Name: Stephen Beard ?MRN: [id number] ?DOB: 04-06-1995 ?Today's Date: 05/27/2021 ? ? ?History of Present Illness Stephen Beard is a 26 y.o. male  with witnessed grand mal seizure at home after using crystal meth. Intubated for airway protection, extubated 05/25/21. EEG consistent with epilepsy and cortical dysfunction arising from R hemisphere, also moderate diffuse encephalopathy. 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  ? ?Clinical Impression ?  ?Patient admitted for the diagnosis above.  PTA he lives with his mother and other relatives.  At baseline he has some cognitive deficits requiring oversight and cueing from his mother.  He performed pretty well on the short blessed, but functionally he has difficulty with divided attention, and often needs cues for safety.  He was able to walk in his room without an AD, and needed generalized supervision for ADL completion.  He does not drive, and his mother assists with IADLs.  OT will continue to follow him in the acute setting.     ?   ? ?Recommendations for follow up therapy are one component of a multi-disciplinary discharge planning process, led by the attending physician.  Recommendations may be updated based on patient status, additional functional criteria and insurance authorization.  ? ?Follow Up Recommendations ? No OT follow up  ?  ?Assistance Recommended at Discharge Intermittent Supervision/Assistance  ?Patient can return home with the following   ? ?  ?Functional Status Assessment ? Patient has had a recent decline in their functional status and demonstrates the ability to make significant improvements in function in a reasonable and predictable amount of time.  ?Equipment Recommendations ? None recommended by OT  ?  ?Recommendations for Other Services   ? ? ?   ?Precautions / Restrictions Precautions ?Precautions: Fall ?Precaution Comments: seizure ?Restrictions ?Weight Bearing Restrictions: No  ? ?  ? ?Mobility Bed Mobility ?Overal bed mobility: Modified Independent ?  ?  ?  ?  ?  ?  ?  ?  ? ?Transfers ?Overall transfer level: Needs assistance ?Equipment used: Rolling walker (2 wheels), None ?Transfers: Sit to/from Stand ?Sit to Stand: Supervision ?  ?  ?  ?  ?  ?  ?  ? ?  ?Balance   ?Sitting-balance support: No upper extremity supported, Feet supported ?Sitting balance-Leahy Scale: Good ?  ?  ?Standing balance support: No upper extremity supported, Bilateral upper extremity supported ?Standing balance-Leahy Scale: Fair ?Standing balance comment: trialed in room mobility with and without RW ?  ?  ?  ?  ?  ?  ?  ?  ?  ?  ?  ?   ? ?ADL either performed or assessed with clinical judgement  ? ?ADL   ?  ?  ?  ?  ?  ?  ?  ?  ?  ?  ?  ?  ?  ?  ?  ?  ?  ?  ?  ?General ADL Comments: Generalized supervision for IV management  ? ? ? ?Vision Patient Visual Report: No change from baseline ?   ?   ?Perception Perception ?Perception: Within Functional Limits ?  ?Praxis Praxis ?Praxis: Intact ?  ? ?Pertinent Vitals/Pain Pain Assessment ?Pain Assessment: No/denies pain  ? ? ? ?Hand Dominance Right ?  ?Extremity/Trunk Assessment Upper Extremity Assessment ?Upper Extremity Assessment: Overall WFL for tasks assessed ?  ?Lower Extremity Assessment ?Lower Extremity Assessment: Defer to PT evaluation ?  ?  Cervical / Trunk Assessment ?Cervical / Trunk Assessment: Normal ?  ?Communication Communication ?Communication: No difficulties ?  ?Cognition Arousal/Alertness: Awake/alert ?  ?Overall Cognitive Status: History of cognitive impairments - at baseline ?Area of Impairment: Following commands, Awareness, Problem solving ?  ?  ?  ?  ?  ?  ?  ?  ?  ?  ?  ?Following Commands: Follows multi-step commands inconsistently, Follows one step commands consistently ?  ?Awareness: Emergent ?Problem Solving:  Slow processing, Requires verbal cues ?General Comments: Per mom, he is close to his baseline.  Patient actually performed well on the short blessed, scoring in the normal range for cognition.  But he does exhibit the above deficits. ?  ?  ?General Comments   VSS on RA ? ?  ?Exercises   ?  ?Shoulder Instructions    ? ? ?Home Living Family/patient expects to be discharged to:: Private residence ?Living Arrangements: Parent;Other relatives ?Available Help at Discharge: Family;Available 24 hours/day ?Type of Home: Apartment ?Home Access: Level entry ?  ?  ?Home Layout: One level ?  ?  ?Bathroom Shower/Tub: Tub/shower unit ?  ?Bathroom Toilet: Standard ?  ?  ?Home Equipment: None ?  ?  ?  ? ?  ?Prior Functioning/Environment   ?  ?  ?  ?  ?  ?  ?  ?ADLs Comments: Patient has not worked since [employer]), showers independently. Can cook with supervision but mom doesn't let him use stove or oven alone.  Does not drive ?  ? ?  ?  ?OT Problem List: Decreased safety awareness;Impaired balance (sitting and/or standing) ?  ?   ?OT Treatment/Interventions: Self-care/ADL training;Patient/family education;Balance training;Therapeutic activities  ?  ?OT Goals(Current goals can be found in the care plan section) Acute Rehab OT Goals ?Patient Stated Goal: Walk without the RW ?OT Goal Formulation: With patient ?Time For Goal Achievement: 06/10/21 ?Potential to Achieve Goals: Good ?ADL Goals ?Pt Will Perform Grooming: Independently;standing ?Pt Will Perform Lower Body Bathing: Independently;sit to/from stand ?Pt Will Perform Lower Body Dressing: Independently;sit to/from stand ?Pt Will Transfer to Toilet: Independently;ambulating;regular height toilet  ?OT Frequency: Min 2X/week ?  ? ?Co-evaluation   ?  ?  ?  ?  ? ?  ?AM-PAC OT "6 Clicks" Daily Activity     ?Outcome Measure Help from another person eating meals?: None ?Help from another person taking care of personal grooming?: None ?Help from another person toileting, which includes using  toliet, bedpan, or urinal?: A Little ?Help from another person bathing (including washing, rinsing, drying)?: A Little ?Help from another person to put on and taking off regular upper body clothing?: None ?Help from another person to put on and taking off regular lower body clothing?: A Little ?6 Click Score: 21 ?  ?End of Session Equipment Utilized During Treatment: Rolling walker (2 wheels) ?Nurse Communication: Mobility status ? ?Activity Tolerance: Patient tolerated treatment well ?Patient left: in bed;with call bell/phone within reach;with family/visitor present ? ?OT Visit Diagnosis: Unsteadiness on feet (R26.81);Other symptoms and signs involving cognitive function  ?              ?Time: 2263-3354 ?OT Time Calculation (min): 21 min ?Charges:  OT General Charges ?$OT Visit: 1 Visit ?OT Evaluation ?$OT Eval Moderate Complexity: 1 Mod ? ?05/27/2021 ? ?RP, OTR/L ? ?Acute Rehabilitation Services ? ?Office:  [phone] ? ? ?Lloyde Ludlam D Amiria Orrison ?05/27/2021, 10:02 AM ?

## 2021-05-27 NOTE — Discharge Instructions (Signed)
Seizure precautions: Per [place] DMV statutes, patients with seizures are not allowed to drive until they have been seizure-free for six months and cleared by a physician    Use caution when using heavy equipment or power tools. Avoid working on ladders or at heights. Take showers instead of baths. Ensure the water temperature is not too high on the home water heater. Do not go swimming alone. Do not lock yourself in a room alone (i.e. bathroom). When caring for infants or small children, sit down when holding, feeding, or changing them to minimize risk of injury to the child in the event you have a seizure. Maintain good sleep hygiene. Avoid alcohol.    If patient has another seizure, call 911 and bring them back to the ED if: A.  The seizure lasts longer than 5 minutes.      B.  The patient doesn't wake shortly after the seizure or has new problems such as difficulty seeing, speaking or moving following the seizure C.  The patient was injured during the seizure D.  The patient has a temperature over 102 F (39C) E.  The patient vomited during the seizure and now is having trouble breathing    During the Seizure   - First, ensure adequate ventilation and place patients on the floor on their left side  Loosen clothing around the neck and ensure the airway is patent. If the patient is clenching the teeth, do not force the mouth open with any object as this can cause severe damage - Remove all items from the surrounding that can be hazardous. The patient may be oblivious to what's happening and may not even know what he or she is doing. If the patient is confused and wandering, either gently guide him/her away and block access to outside areas - Reassure the individual and be comforting - Call 911. In most cases, the seizure ends before EMS arrives. However, there are cases when seizures may last over 3 to 5 minutes. Or the individual may have developed breathing difficulties or severe  injuries. If a pregnant patient or a person with diabetes develops a seizure, it is prudent to call an ambulance. - Finally, if the patient does not regain full consciousness, then call EMS. Most patients will remain confused for about 45 to 90 minutes after a seizure, so you must use judgment in calling for help.      After the Seizure (Postictal Stage)   After a seizure, most patients experience confusion, fatigue, muscle pain and/or a headache. Thus, one should permit the individual to sleep. For the next few days, reassurance is essential. Being calm and helping reorient the person is also of importance.   Most seizures are painless and end spontaneously. Seizures are not harmful to others but can lead to complications such as stress on the lungs, brain and the heart. Individuals with prior lung problems may develop labored breathing and respiratory distress.  

## 2021-05-27 NOTE — Plan of Care (Signed)
?  Problem: Health Behavior/Discharge Planning: ?Goal: Ability to manage health-related needs will improve ?Outcome: Adequate for Discharge ? To be discharged today with outpatient PT ?

## 2021-06-04 ENCOUNTER — Other Ambulatory Visit: Payer: Self-pay

## 2021-06-04 ENCOUNTER — Encounter: Payer: Self-pay | Admitting: Physical Therapy

## 2021-06-04 ENCOUNTER — Ambulatory Visit: Payer: Medicaid Other | Attending: Internal Medicine | Admitting: Physical Therapy

## 2021-06-04 DIAGNOSIS — G40901 Epilepsy, unspecified, not intractable, with status epilepticus: Secondary | ICD-10-CM | POA: Insufficient documentation

## 2021-06-04 DIAGNOSIS — M6281 Muscle weakness (generalized): Secondary | ICD-10-CM | POA: Diagnosis present

## 2021-06-04 DIAGNOSIS — R296 Repeated falls: Secondary | ICD-10-CM | POA: Insufficient documentation

## 2021-06-04 DIAGNOSIS — R2689 Other abnormalities of gait and mobility: Secondary | ICD-10-CM | POA: Diagnosis present

## 2021-06-04 NOTE — Therapy (Signed)
?OUTPATIENT PHYSICAL THERAPY NEURO EVALUATION ? ? ?Patient Name: Stephen Beard ?MRN: [id number] ?DOB:March 07, 1996, 26 y.o., male ?Today's Date: 06/04/2021 ? ?PCP: [facility] ?REFERRING PROVIDER: Shelly Coss, MD ? ? PT End of Session - 06/04/21 1058   ? ? Visit Number 1   ? Number of Visits 17   plus eval  ? Date for PT Re-Evaluation 08/27/21   ? Authorization Type Medicaid [place] Access   ? PT Start Time 1055   ? PT Stop Time 6063   ? PT Time Calculation (min) 54 min   ? Activity Tolerance Patient tolerated treatment well   ? Behavior During Therapy Impulsive   ? ?  ?  ? ?  ? ? ?Past Medical History:  ?Diagnosis Date  ? ADD (attention deficit disorder)   ? ADHD   ? 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 Headache   ? Psychotic disorder ([place])   ? Seizures ([place])   ? ?Past Surgical History:  ?Procedure Laterality Date  ? APPLICATION OF CRANIAL NAVIGATION N/A 07/02/2019  ? Procedure: APPLICATION OF CRANIAL NAVIGATION;  Surgeon: Judith Part, MD;  Location: [place];  Service: Neurosurgery;  Laterality: N/A;  ? CRANIOTOMY N/A 07/03/2019  ? Procedure: CRANIOTOMY HEMATOMA EVACUATION SUBDURAL;  Surgeon: Judith Part, MD;  Location: [place];  Service: Neurosurgery;  Laterality: N/A;  ? CRANIOTOMY Right 07/03/2019  ? Procedure: CRANIOTOMY HEMATOMA EVACUATION SUBDURAL;  Surgeon: Judith Part, MD;  Location: [place];  Service: Neurosurgery;  Laterality: Right;  ? CRANIOTOMY Right 07/02/2019  ? Procedure: CRANIOTOMY TUMOR EXCISION with Lucky Rathke;  Surgeon: Judith Part, MD;  Location: [place];  Service: Neurosurgery;  Laterality: Right;  posterior  ? ?Patient Active Problem List  ? Diagnosis Date Noted  ? Status epilepticus ([place]) 05/22/2021  ? Alcohol abuse 01/11/2020  ? 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 Intentional overdose of drug in tablet form ([place]) 01/05/2020  ? Drug overdose, multiple drugs, intentional self-harm, initial encounter ([place]) 01/04/2020  ?  Schizoaffective disorder, depressive type ([place]) 01/01/2020  ? Generalized anxiety disorder 01/01/2020  ? Severe episode of recurrent major depressive disorder, with psychotic features ([place]) 01/01/2020  ? 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 Focal seizures ([place]) 11/07/2019  ? Auditory hallucinations 07/30/2019  ? Goals of care, counseling/discussion 07/19/2019  ? 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 Anaplastic oligodendroglioma, 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[place]) 07/01/2019  ? Brain mass 06/30/2019  ? Attention deficit hyperactivity disorder (ADHD), combined type 03/23/2019  ? Mild episode of recurrent major depressive disorder ([place]) 03/23/2019  ? Tobacco abuse 10/04/2012  ? Allergic rhinitis 08/18/2012  ? ? ?ONSET DATE: 05/27/2021 (referral) ? ?REFERRING DIAG: G40.901 (ICD-10-CM) - Status epilepticus ([place])  ? ?THERAPY DIAG:  ?Other abnormalities of gait and mobility ? ?Muscle weakness (generalized) ? ?Repeated falls ? ?SUBJECTIVE:  ?                                                                                                                                                                                           ? ?  SUBJECTIVE STATEMENT: ?"I messed up and it was stupid". Reports difficulty with vision out of L eye 2/2 brain tumor 2 years ago. Pt reports he feels as though his legs are weak and going to "go out" on him. Pt has had multiple falls and tends to furniture walk in his home.  ? ? ?Pt accompanied by: self ? ?PERTINENT HISTORY: 26 y.o. male with witnessed grand mal seizure at home after using crystal meth. Intubated for airway protection, extubated 05/25/21. EEG consistent with epilepsy and cortical dysfunction arising from R hemisphere, also moderate diffuse encephalopathy. 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   ? ?PAIN:  ?Are you having pain? No ? ?PRECAUTIONS: Fall and Other: Seizure ? ?WEIGHT BEARING RESTRICTIONS No ? ?FALLS: Has patient fallen in last 6 months? Yes, Number of falls: at least 6, maybe more. At one point was falling once per week.  ? ?LIVING ENVIRONMENT: ?Lives with: lives with their family ?Lives in: House/apartment ?Stairs: No ?Has following equipment at home: Gilford Rile - 2 wheeled ? ?PLOF: Independent ? ?PATIENT GOALS "To strengthen my legs and regain my balance"  ? ?OBJECTIVE:  ? ?COGNITION: ?Overall cognitive status: Impaired: Memory: Deficits impaired short term memory ?Awareness: Deficits poor safety awareness, deficits with selective attention ?Executive function:Reasoning, Sequencing, Armed forces logistics/support/administrative officer, and Decision making ?Prosopagnosia present  ?  ?SENSATION: ?WFL ? ?COORDINATION: ?Heel to shin test WNL bilaterally  ?Toe taps symmetric bilaterally  ? ?POSTURE: rounded shoulders and forward head ? ?MMT:   ? ?MMT Right ?06/04/2021 Left ?06/04/2021  ?Hip flexion 4+ 4+  ?Hip extension    ?Hip abduction 3+ 3+  ?Hip adduction 3+ 3+  ?Hip internal rotation    ?Hip external rotation    ?Knee flexion 5 5  ?Knee extension 5 5  ?Ankle dorsiflexion 4 4  ?Ankle plantarflexion 4 4  ?Ankle inversion    ?Ankle eversion    ?(Blank rows = not tested) ? ?TRANSFERS: ?Assistive device utilized: None  ?Sit to stand: CGA ?Stand to sit: CGA ?Chair to chair: CGA ?Max verbal cues for sequencing for simple tasks, several minor anterior LOB episodes in which pt reached out for therapist during transfer.  ? ?CURB:  ?Level of Assistance: Min A ?Assistive device utilized: None ?Curb Comments: Pt unable to see curb well and demonstrated poor eccentric control while descending, resulting in anterior LOB requiring min A.  ? ?STAIRS: ? Level of Assistance: CGA ? Stair Negotiation Technique: Alternating Pattern  with Single Rail on Right ? Number of Stairs: 4  ? Height of Stairs: 6"  ?Comments: Pt ascended/descends very slowly and  cautiously, feels out for step with foot prior to shifting weight to leg  ? ?GAIT: ?Gait pattern: step through pattern, decreased arm swing- Right, decreased arm swing- Left, decreased step length- Right, decreased stance time- Left, decreased stride length, decreased hip/knee flexion- Right, decreased hip/knee flexion- Left, decreased ankle dorsiflexion- Right, Right foot flat, ataxic, decreased trunk rotation, narrow BOS, and poor foot clearance- Right ?Distance walked: Various clinic distances  ?Assistive device utilized: None ?Level of assistance: CGA ?Comments: Pt demonstrates significant R lateral path deviations and poor awareness to L side. Pt unaware of deficits and does not visually scan environment and does not respond to max verbal cues.  ? ?Distance walked: 100'  ?Assistive device utilized: Environmental consultant - 2 wheeled ?Level of assistance: Min A ?Comments: Pt frequently picking up walker, stepping outside of walker or pushing walker  away despite max verbal and tactile cues. Pt unaware of his tendency to pick up walker.  ? ? ?FUNCTIONAL TESTs:  ?5 times sit to stand: 12.19s without UE support, maintained hip flexion at top of stand  ?Timed up and go (TUG): 10.62s without AD (min guard w/turn), 19.06s w/RW ?Berg Balance Scale: 49/56 (moderate fall risk) ?Gait velocity: 11.47s without AD = 2.85 ft/s  ?FGA: 13/30 (High fall risk) ? ?TODAY'S TREATMENT:  ?See clinical impression statement  ? ? ?PATIENT EDUCATION: ?Education details: POC, goals, outcomes assessment, bringing RW to next session ?Person educated: Patient ?Education method: Explanation and Demonstration ?Education comprehension: verbalized understanding and needs further education ? ? ?HOME EXERCISE PROGRAM: ?To be established next session  ? ? ? ?GOALS: ?Goals reviewed with patient? Yes ? ?SHORT TERM GOALS: Target date: 07/02/2021 ? ?Pt will be independent with initial HEP for improved strength, balance, transfers and gait. ? ?Baseline:  ?Goal status:  INITIAL ? ?2.  Pt will improve FGA to 18/30 for decreased fall risk  ? ?Baseline: 13/30 ?Goal status: INITIAL ? ?3.  Pt will maintain gait velocity of 2.8 ft/s w/LRAD for improved functional mobility ?Baseline: 2.85 ft/s wi

## 2021-06-08 ENCOUNTER — Ambulatory Visit: Payer: Medicaid Other

## 2021-06-11 ENCOUNTER — Ambulatory Visit: Payer: Medicaid Other | Admitting: Physical Therapy

## 2021-06-11 DIAGNOSIS — R2689 Other abnormalities of gait and mobility: Secondary | ICD-10-CM

## 2021-06-11 DIAGNOSIS — M6281 Muscle weakness (generalized): Secondary | ICD-10-CM

## 2021-06-11 DIAGNOSIS — R296 Repeated falls: Secondary | ICD-10-CM

## 2021-06-11 NOTE — Therapy (Signed)
?OUTPATIENT PHYSICAL THERAPY TREATMENT NOTE ? ? ?Patient Name: Stephen Beard ?MRN: [id number] ?DOB:April 04, 1995, 26 y.o., male, male ?Today's Date: 06/11/2021 ? ?[facility]P: [facility] ?REFERRING PROVIDER: Shelly Coss, MD  ? ? PT End of Session - 06/11/21 1419   ? ? Visit Number 2   ? Number of Visits 17   plus eval  ? Date for PT Re-Evaluation 08/27/21   ? Authorization Type Medicaid [place] Access   ? PT Start Time 1415   ? PT Stop Time 1453   ? PT Time Calculation (min) 38 min   ? Equipment Utilized During Treatment --   ? Activity Tolerance Patient tolerated treatment well   ? Behavior During Therapy [facility] for tasks assessed/performed   ? ?  ?  ? ?  ? ? ?Past Medical History:  ?Diagnosis Date  ? ADD (attention deficit disorder)   ? ADHD   ? 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 Headache   ? Psychotic disorder ([place])   ? Seizures ([place])   ? ?Past Surgical History:  ?Procedure Laterality Date  ? APPLICATION OF CRANIAL NAVIGATION N/A 07/02/2019  ? Procedure: APPLICATION OF CRANIAL NAVIGATION;  Surgeon: Judith Part, MD;  Location: [place];  Service: Neurosurgery;  Laterality: N/A;  ? CRANIOTOMY N/A 07/03/2019  ? Procedure: CRANIOTOMY HEMATOMA EVACUATION SUBDURAL;  Surgeon: Judith Part, MD;  Location: [place];  Service: Neurosurgery;  Laterality: N/A;  ? CRANIOTOMY Right 07/03/2019  ? Procedure: CRANIOTOMY HEMATOMA EVACUATION SUBDURAL;  Surgeon: Judith Part, MD;  Location: [place];  Service: Neurosurgery;  Laterality: Right;  ? CRANIOTOMY Right 07/02/2019  ? Procedure: CRANIOTOMY TUMOR EXCISION with Lucky Rathke;  Surgeon: Judith Part, MD;  Location: [place];  Service: Neurosurgery;  Laterality: Right;  posterior  ? ?Patient Active Problem List  ? Diagnosis Date Noted  ? Status epilepticus ([place]) 05/22/2021  ? Alcohol abuse 01/11/2020  ? 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[place]) 01/07/2020  ? Intentional overdose of drug in tablet form ([place]) 01/05/2020  ? Drug overdose, multiple drugs,  intentional self-harm, initial encounter ([place]) 01/04/2020  ? Schizoaffective disorder, depressive type ([place]) 01/01/2020  ? Generalized anxiety disorder 01/01/2020  ? Severe episode of recurrent major depressive disorder, with psychotic features ([place]) 01/01/2020  ? 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 Focal seizures ([place]) 11/07/2019  ? Auditory hallucinations 07/30/2019  ? Goals of care, counseling/discussion 07/19/2019  ? 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 Anaplastic oligodendroglioma, 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[place]) 07/01/2019  ? Brain mass 06/30/2019  ? Attention deficit hyperactivity disorder (ADHD), combined type 03/23/2019  ? Mild episode of recurrent major depressive disorder ([place]) 03/23/2019  ? Tobacco abuse 10/04/2012  ? Allergic rhinitis 08/18/2012  ? ? ?REFERRING DIAG: G40.901 (ICD-10-CM) - Status epilepticus ([place])  ? ?THERAPY DIAG:  ?Other abnormalities of gait and mobility ? ?Muscle weakness (generalized) ? ?Repeated falls ? ?PERTINENT HISTORY: 26 y.o. male with witnessed grand mal seizure at home after using crystal meth. Intubated for airway protection, extubated 05/25/21. EEG consistent with epilepsy and cortical dysfunction arising from R hemisphere, also moderate diffuse encephalopathy. 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  ? ?PRECAUTIONS: Fall;Other (comment) ?Precaution Comments: seizure, L homonymous hemianopsia  ? ?SUBJECTIVE: Pt reports he is doing well, has been using his RW for community ambulation in grocery store. No new falls  ? ?PAIN:  ?Are you  having pain? No ? ?OBJECTIVE:  ?  ?COGNITION: ?Overall cognitive status: Impaired: Memory: Deficits impaired short term memory ?Awareness: Deficits poor safety awareness, deficits with selective attention ?Executive function:Reasoning, Sequencing,  Armed forces logistics/support/administrative officer, and Decision making ?Prosopagnosia present  ? ?  ?TODAY'S TREATMENT:  ?Ther Act ?Established and demonstrated HEP (See below) for improved BLE coordination, BLE strength and balance. Pt required mod verbal cues to initiate movement but was able to demonstrate safely and teach safe set up at home.  ? ?Gait Training  ?Gait pattern:  right path deviation, step through pattern, decreased ankle dorsiflexion- Right, decreased ankle dorsiflexion- Left, ataxic, and narrow BOS ?Distance walked: 230' ?Assistive device utilized: Environmental consultant - 2 wheeled ?Level of assistance: SBA ?Comments: Pt demonstrated improved management of RW while walking around clinic and did not attempt to lift RW for turns. Pt still turns with very large radius and does not scan L periphery, min verbal cues to avoid obstacles on L side  ? ?Gait pattern:  R path deviation, step through pattern, decreased ankle dorsiflexion- Right, decreased ankle dorsiflexion- Left, ataxic, and narrow BOS ?Distance walked: 230' ?Assistive device utilized: Environmental consultant - 4 wheeled ?Level of assistance: SBA ?Comments: Pt demonstrated good management of rollator with improved cadence and decreased turning radius  ? ?  ?  ?PATIENT EDUCATION: ?Education details: Initial HEP and safe set up at home, continued use of RW for community ambulation ?Person educated: Patient ?Education method: Customer service manager and handout ?Education comprehension: verbalized understanding and needs further education ?  ?  ?HOME EXERCISE PROGRAM: ?Access Code: [email or web address] ?URL: [email or web address] ?Date: 06/11/2021 ?Prepared by: Mickie Bail Oswin Griffith ? ?Exercises ?- Sit to Stand with Resistance Around Legs  - 1 x daily - 7 x weekly - 3 sets - 15 reps ?- Side Stepping with Resistance at Thighs and Counter Support  - 1 x daily - 7 x weekly - 3 sets - 10 reps ?- Forward Backward Monster Walk with Band at [employer] and [employer]  - 1 x daily - 7 x weekly - 3 sets - 10 reps ?- Heel  to toe walking   - 1 x daily - 7 x weekly - 3 sets - 10 reps ?- Backward heel to toe walking   - 1 x daily - 7 x weekly - 3 sets - 10 reps ?- Heel Raises with Counter Support  - 1 x daily - 7 x weekly - 3 sets - 10 reps ?  ?  ?  ?GOALS: ?Goals reviewed with patient? Yes ?  ?SHORT TERM GOALS: Target date: 07/02/2021 ?  ?Pt will be independent with initial HEP for improved strength, balance, transfers and gait. ?  ?Baseline:  ?Goal status: INITIAL ?  ?2.  Pt will improve FGA to 18/30 for decreased fall risk  ?  ?Baseline: 13/30 ?Goal status: INITIAL ?  ?3.  Pt will maintain gait velocity of 2.8 ft/s w/LRAD for improved functional mobility ?Baseline: 2.85 ft/s without AD and CGA ?Goal status: INITIAL ?  ?4.  Pt will report 3 or fewer falls since initial evaluation for improved balance and reduced fall frequency  ?Baseline: multiple falls per week ?Goal status: INITIAL ?  ?5.  Pt will improve normal TUG to less than or equal to 15 seconds w/LRAD for improved functional mobility and decreased fall risk. ?  ?Baseline: 19.06s w/RW ?Goal status: INITIAL ?  ?  ?LONG TERM GOALS: Target date: 07/30/2021 ?  ?Pt will be independent with final HEP for improved strength, balance, transfers and gait. ?  ?  Baseline:  ?Goal status: INITIAL ?  ?2.  Pt will improve FGA to 24/30 for decreased fall risk   ?Baseline: 13/30 ?Goal status: INITIAL ?  ?3.  Pt will improve 5 x STS to less than or equal to 10 seconds without UE support and attaining full extension to demonstrate improved functional strength and transfer efficiency.   ?Baseline: 12.19s without UE support (unable to achieve full extension) ?Goal status: INITIAL ?  ?4.  Pt will improve gait velocity to at least 3.7f/s with LRAD mod I for improved gait efficiency and safety ?  ?Baseline: 2.85 ft/s without AD and CGA ?Goal status: INITIAL ?  ?5.  Pt will report 1 fall or fewer since previous goal assessment for decreased fall frequency and improved balance ?Baseline: multiple falls  per week ?Goal status: INITIAL ?  ?5.  Pt will teach back strategies to scan his environment to compensate for visual deficits for reduced fall risk and improved safety in community.  ?Baseline:  ?Goal status:

## 2021-06-14 ENCOUNTER — Ambulatory Visit: Payer: Medicaid Other | Attending: Internal Medicine | Admitting: Physical Therapy

## 2021-06-18 ENCOUNTER — Ambulatory Visit: Payer: Medicaid Other | Admitting: Physical Therapy

## 2021-06-23 ENCOUNTER — Ambulatory Visit: Payer: Medicaid Other | Admitting: Physical Therapy

## 2021-06-25 ENCOUNTER — Ambulatory Visit: Payer: Medicaid Other | Admitting: Physical Therapy

## 2021-06-30 ENCOUNTER — Ambulatory Visit: Payer: Medicaid Other | Admitting: Physical Therapy

## 2021-07-02 ENCOUNTER — Ambulatory Visit: Payer: Medicaid Other

## 2021-07-06 ENCOUNTER — Ambulatory Visit: Payer: Medicaid Other | Admitting: Physical Therapy

## 2021-07-09 ENCOUNTER — Ambulatory Visit: Payer: Medicaid Other | Admitting: Physical Therapy

## 2021-07-13 ENCOUNTER — Ambulatory Visit: Payer: Medicaid Other | Admitting: Physical Therapy

## 2021-07-16 ENCOUNTER — Ambulatory Visit: Payer: Medicaid Other | Admitting: Physical Therapy

## 2021-07-16 ENCOUNTER — Telehealth: Payer: Self-pay | Admitting: *Deleted

## 2021-07-16 NOTE — Telephone Encounter (Signed)
Patient's mother called with request for assistance.  ? ?Patient was referred for OP PT following d/c from hospital on 3/16. He was authorized for Evaluation + 17 visits per PT note.  ? ?He had PT appts on 06/04/21 and 06/11/21, with additional appts scheduled.  ? ?Patient went to [place] with family for several weeks. When he returned, mother states they went to the next scheduled PT appointment and were told that he had been discharged.   ? ?His mother stated PT office informed her that their policy was to discharge patients after they miss 2 or more appointments without patient notifying them. ? ?Mother was told by PT office that in order for patient to have PT, he needs a new order and she is hoping Dr. Mickeal Skinner will order the PT for him.  ? ?Informed mother that Dr. Mickeal Skinner out of office today and that message will be sent to his InBasket.   ?Mother verbalized understanding.  ?

## 2021-07-19 ENCOUNTER — Other Ambulatory Visit: Payer: Self-pay | Admitting: *Deleted

## 2021-07-20 ENCOUNTER — Ambulatory Visit: Payer: Medicaid Other | Admitting: Physical Therapy

## 2021-07-20 ENCOUNTER — Encounter: Payer: Self-pay | Admitting: Internal Medicine

## 2021-07-20 ENCOUNTER — Other Ambulatory Visit: Payer: Self-pay | Admitting: *Deleted

## 2021-07-20 DIAGNOSIS — C714 Malignant neoplasm of occipital lobe: Secondary | ICD-10-CM

## 2021-07-20 NOTE — Telephone Encounter (Signed)
Attempted to reach patient to advise that new order has been placed to reinitiate Physical Therapy.  Left message pending call back. ?

## 2021-07-22 ENCOUNTER — Telehealth: Payer: Self-pay | Admitting: *Deleted

## 2021-07-22 NOTE — Telephone Encounter (Signed)
Patients mother called about new referral back to neuro rehab.  Advised that order was put back in the system this past Tuesday. ? ?Reports that he is getting established with Behavior Health outpatient at the end of next month but needs refill of Buspirone and Zoloft until then.  The delay was due to changing his Medicaid back to [place].   ? ?Pharmacy [facility] ?

## 2021-07-23 ENCOUNTER — Ambulatory Visit: Payer: Medicaid Other | Admitting: Physical Therapy

## 2021-07-27 ENCOUNTER — Ambulatory Visit: Payer: Medicaid Other | Admitting: Physical Therapy

## 2021-07-30 ENCOUNTER — Ambulatory Visit: Payer: Medicaid Other | Admitting: Physical Therapy

## 2021-08-03 ENCOUNTER — Ambulatory Visit: Payer: Medicaid Other | Admitting: Physical Therapy

## 2021-08-05 ENCOUNTER — Other Ambulatory Visit: Payer: Self-pay | Admitting: *Deleted

## 2021-08-05 DIAGNOSIS — C714 Malignant neoplasm of occipital lobe: Secondary | ICD-10-CM

## 2021-08-05 NOTE — Progress Notes (Signed)
Mother requested eye evaluation, referral made.

## 2021-08-06 ENCOUNTER — Ambulatory Visit: Payer: Medicaid Other

## 2021-08-12 ENCOUNTER — Encounter: Payer: Self-pay | Admitting: Internal Medicine

## 2021-08-19 ENCOUNTER — Ambulatory Visit (HOSPITAL_COMMUNITY)
Admission: RE | Admit: 2021-08-19 | Discharge: 2021-08-19 | Disposition: A | Discharge status: Home / Self Care | Payer: Medicaid Other | Source: Ambulatory Visit | Attending: Internal Medicine | Admitting: Internal Medicine

## 2021-08-19 DIAGNOSIS — C714 Malignant neoplasm of occipital lobe: Secondary | ICD-10-CM | POA: Diagnosis not present

## 2021-08-19 IMAGING — MR MR HEAD WO/W CM
13 series · 48 of 48 positions shown · IV contrast (gadavist)
Comparison: [DATE]
COMPARISON: [DATE]

Addendum:
CLINICAL DATA: Brain/CNS neoplasm, assess treatment response;
anaplastic oligodendroglioma

EXAM:
MRI HEAD WITHOUT AND WITH CONTRAST
TECHNIQUE: Multiplanar, multiecho pulse sequences of the brain and surrounding
structures were obtained without and with intravenous contrast.
CONTRAST:  6mL GADAVIST GADOBUTROL 1 MMOL/ML IV SOLN

[Series 5: DWI · axial · 3.0mm · 1.36mm/px · z∈[-22,+131]mm · 6 of 104 slices shown (1 of 2)]
[im 1/104]
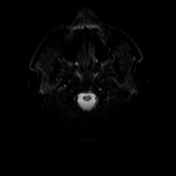
[im 21/104]
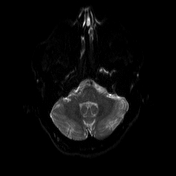
[im 42/104]
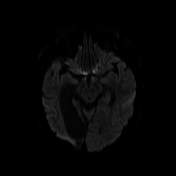
[im 62/104]
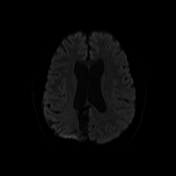
[im 83/104]
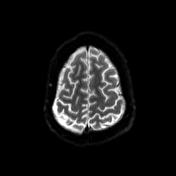
[im 104/104]
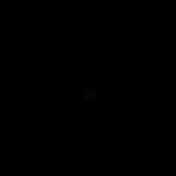

[Series 6: DWI · axial · 3.0mm · 1.36mm/px · z∈[-22,+131]mm · 3 of 52 slices shown (2 of 2)]
[im 1/52]
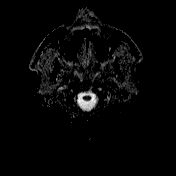
[im 26/52]
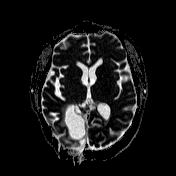
[im 52/52]
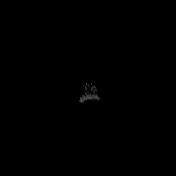

[Series 7: T1 · sagittal · 5.0mm · 0.75mm/px · 1 of 24 slices shown (1 of 2)]
[im 1/24]
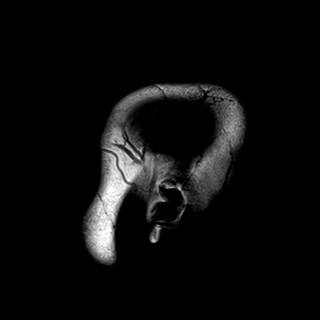

[Series 8: T2 · axial · 5.0mm · 0.62mm/px · z∈[-28,+134]mm · 2 of 26 slices shown]
[im 1/26]
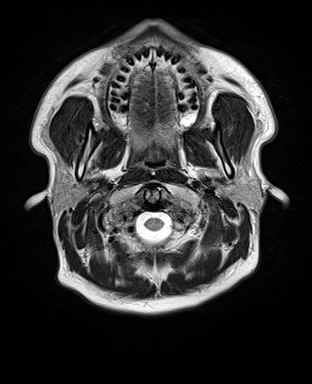
[im 26/26]
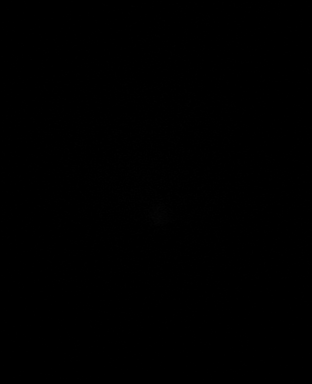

[Series 9: swi_images · axial · 3.0mm · 0.75mm/px · z∈[-30,+134]mm · 3 of 56 slices shown]
[im 1/56]
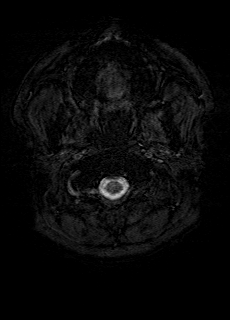
[im 28/56]
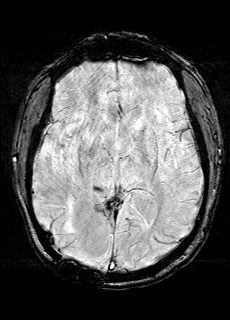
[im 56/56]
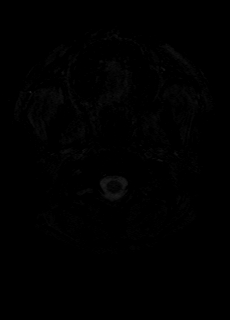

[Series 11: FLAIR · axial · 3.0mm · 0.75mm/px · z∈[-24,+128]mm · 3 of 52 slices shown]
[im 1/52]
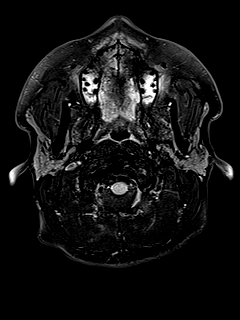
[im 26/52]
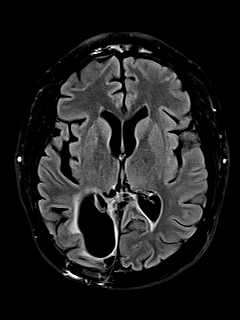
[im 52/52]
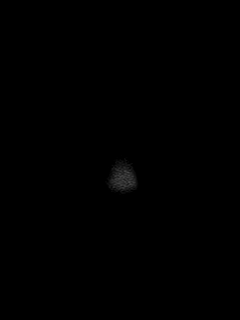

[Series 12: T1 · axial · 1.0mm · 0.94mm/px · z∈[-27,+132]mm · 9 of 160 slices shown (2 of 2)]
[im 1/160]
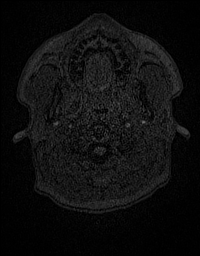
[im 20/160]
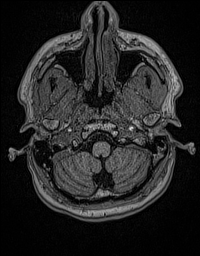
[im 40/160]
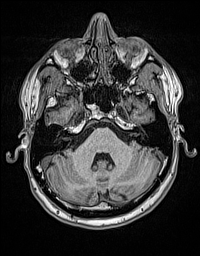
[im 60/160]
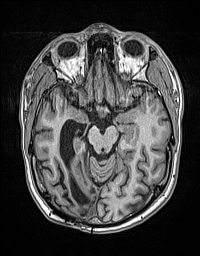
[im 80/160]
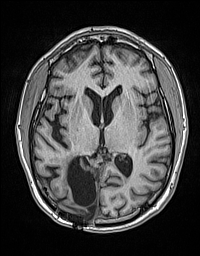
[im 100/160]
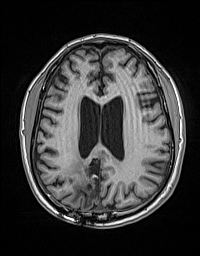
[im 120/160]
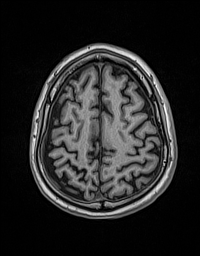
[im 140/160]
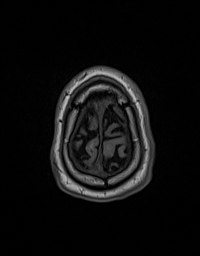
[im 160/160]
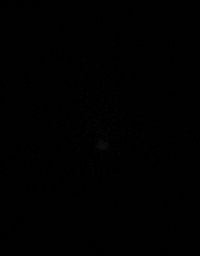

[Series 13: cor dwi_tracew · coronal · 5.0mm · 1.53mm/px · 3 of 56 slices shown]
[im 1/56]
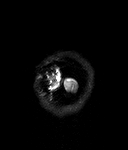
[im 28/56]
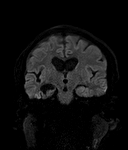
[im 56/56]
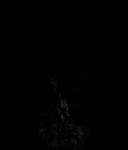

[Series 14: cor dwi_adc · coronal · 5.0mm · 1.53mm/px · 2 of 28 slices shown]
[im 1/28]
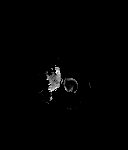
[im 28/28]
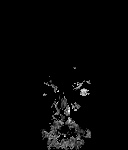

[Series 19: T2 post-contrast · coronal · 5.0mm · 0.57mm/px · 2 of 30 slices shown]
[im 1/30]
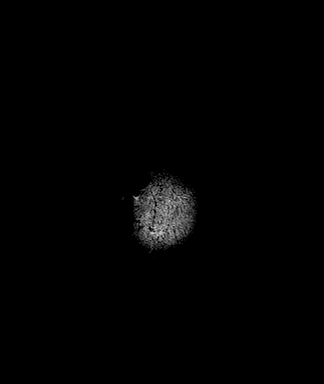
[im 30/30]
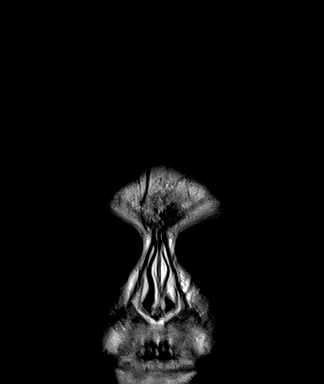

[Series 20: T1 post-contrast · axial · 1.0mm · 0.94mm/px · z∈[-58,+116]mm · 10 of 176 slices shown (1 of 3)]
[im 1/176]
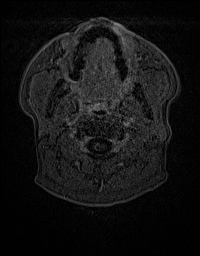
[im 20/176]
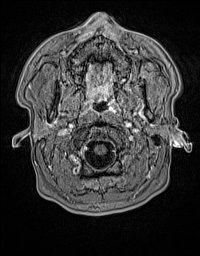
[im 39/176]
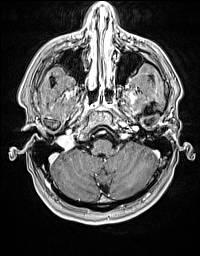
[im 59/176]
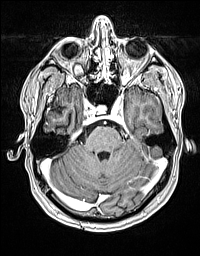
[im 78/176]
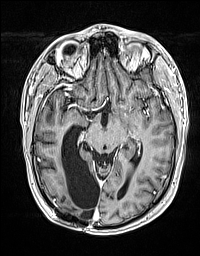
[im 98/176]
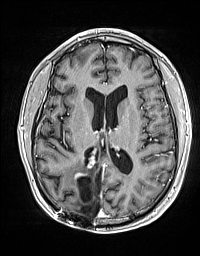
[im 117/176]
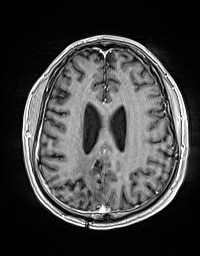
[im 137/176]
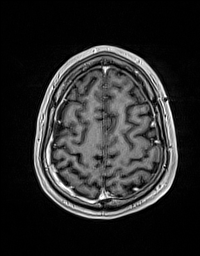
[im 156/176]
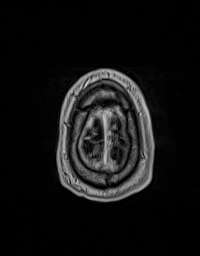
[im 176/176]
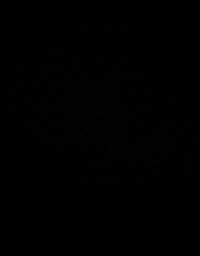

[Series 21: T1 post-contrast · coronal · 5.0mm · 0.43mm/px · 2 of 30 slices shown (2 of 3)]
[im 1/30]
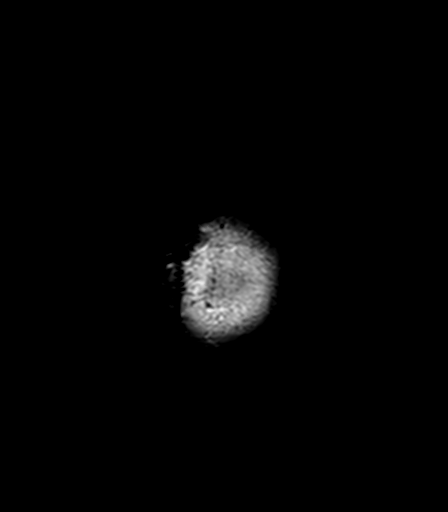
[im 30/30]
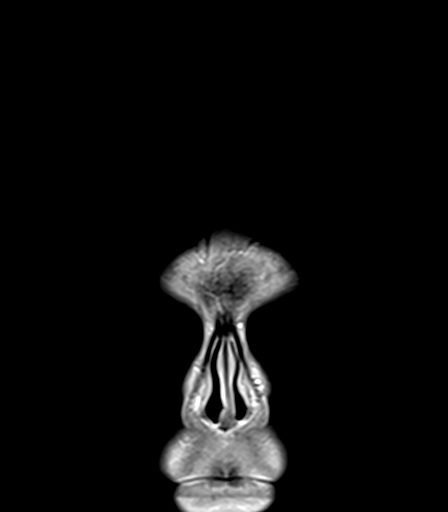

[Series 22: T1 post-contrast · sagittal · 5.0mm · 0.75mm/px · 2 of 26 slices shown (3 of 3)]
[im 1/26]
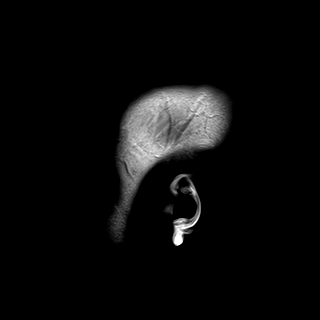
[im 26/26]
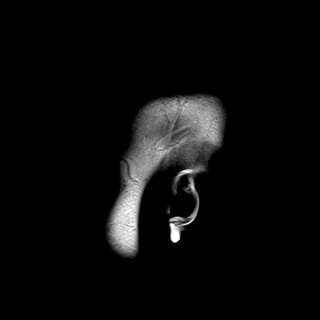

[48 of 48 positions shown; findings below may reference images not displayed]

FINDINGS: Brain: Right parieto-occipital postoperative and treatment changes
with areas of cystic encephalomalacia and ex vacuo dilatation of the
occipital and temporal horns. Extent of T2 FLAIR hyperintensity in
this region is stable. There is persistent subependymal enhancement
and this appears stable to mildly decreased. As before, there is
right superior cerebral convexity mild dural thickening and
enhancement. No new abnormal enhancement. New focus of
susceptibility along the lateral margin of the right occipital horn
likely reflects microhemorrhage.

No acute infarction. Ventricles are stable in size. No significant
mass effect.

Vascular: Major vessel flow voids at the skull base are preserved.
Right jugular bulb diverticulum.

Skull and upper cervical spine: Normal marrow signal is preserved.

Sinuses/Orbits: Paranasal sinuses are aerated. Orbits are
unremarkable.

Other: Sella is unremarkable.  Mastoid air cells are clear.
IMPRESSION: Stable to mildly decreased subintimal enhancement along the right
occipital horn. No new abnormal enhancement and otherwise no
significant change since [DATE].

ADDENDUM:
Dictation transcription error in the impression. Should state
subependymal, not subintimal.

*** End of Addendum ***
FINDINGS: Brain: Right parieto-occipital postoperative and treatment changes
with areas of cystic encephalomalacia and ex vacuo dilatation of the
occipital and temporal horns. Extent of T2 FLAIR hyperintensity in
this region is stable. There is persistent subependymal enhancement
and this appears stable to mildly decreased. As before, there is
right superior cerebral convexity mild dural thickening and
enhancement. No new abnormal enhancement. New focus of
susceptibility along the lateral margin of the right occipital horn
likely reflects microhemorrhage.

No acute infarction. Ventricles are stable in size. No significant
mass effect.

Vascular: Major vessel flow voids at the skull base are preserved.
Right jugular bulb diverticulum.

Skull and upper cervical spine: Normal marrow signal is preserved.

Sinuses/Orbits: Paranasal sinuses are aerated. Orbits are
unremarkable.

Other: Sella is unremarkable.  Mastoid air cells are clear.
IMPRESSION: Stable to mildly decreased subintimal enhancement along the right
occipital horn. No new abnormal enhancement and otherwise no
significant change since [DATE].

## 2021-08-19 MED ORDER — GADOBUTROL 1 MMOL/ML IV SOLN
6.0000 mL | Freq: Once | INTRAVENOUS | Status: AC | PRN
  Administered 2021-08-19: 6 mL via INTRAVENOUS

## 2021-08-23 ENCOUNTER — Inpatient Hospital Stay: Payer: Medicaid Other

## 2021-08-23 ENCOUNTER — Telehealth: Payer: Self-pay | Admitting: Internal Medicine

## 2021-08-23 ENCOUNTER — Inpatient Hospital Stay: Payer: Medicaid Other | Attending: Internal Medicine | Admitting: Internal Medicine

## 2021-08-23 ENCOUNTER — Other Ambulatory Visit: Payer: Self-pay

## 2021-08-23 VITALS — BP 118/88 | HR 86 | Temp 98.1°F | Resp 15 | Wt 135.0 lb

## 2021-08-23 DIAGNOSIS — C714 Malignant neoplasm of occipital lobe: Secondary | ICD-10-CM | POA: Diagnosis present

## 2021-08-23 DIAGNOSIS — G40901 Epilepsy, unspecified, not intractable, with status epilepticus: Secondary | ICD-10-CM | POA: Diagnosis not present

## 2021-08-23 DIAGNOSIS — R569 Unspecified convulsions: Secondary | ICD-10-CM | POA: Diagnosis not present

## 2021-08-23 DIAGNOSIS — C719 Malignant neoplasm of brain, unspecified: Secondary | ICD-10-CM | POA: Diagnosis not present

## 2021-08-23 NOTE — Telephone Encounter (Signed)
Per 6/12 los called pt but there was no answer and no voicemail.. Mailing pt a callander with appointment

## 2021-08-23 NOTE — Progress Notes (Signed)
[facility] at [employer] [place], [place] [address] [phone]   Interval Evaluation  Date of Service: 08/23/21 Patient Name: Stephen Beard Patient MRN: [id number] Patient DOB: 09/28/1995 Provider: [name], MD  Identifying Statement:  Stephen Beard is a 26 y.o. male with right parietal  anaplastic oligodendoglioma    Oncologic History: Oncology History  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[place])  07/02/2019 Surgery   Craniotomy, resection by Dr. Zada Finders. Followed by hematoma evacuation on 07/03/19.   08/05/2019 - 09/17/2019 Radiation Therapy   IMRT with concurrent Temodar 13m/m2 daily   10/15/2019 -  Chemotherapy    Patient is on Treatment Plan: BRAIN ANAPLASTIC GLIOMA GRADE III TEMOZOLOMIDE POST XRT Q28D        Biomarkers:  MGMT Unknown.  IDH 1/2 Mutated.  EGFR Unknown  1p/19q co-deleted   Interval History:  BRomello HoehnBridgers presents today for follow up after recent MRI brain.  Since his hospitalization and ICU course for status epilepticus, no frank seizures, and Lamictal compliance has been good. Otherwise denies new or progressive deficits.  Occassional tension headaches only.  Looking for a new job currently.  H+P (07/19/19) Patient presented to medical attention with several months of progressive headaches and left sided visual impairment.  This was accompanied by episodes of deja-vu +paroxysmal burning smells which would occur almost daily over that time.  CNS imaging demonstrated large parieto-occipital mass with herniation syndrome.  Mass was resected/debulked, and post-operative course was complicated by intracranial hematoma and also respiratory failure.  At present, he is walking and talking, carries left sided visual impairment.  Currently on antibiotics for wound infection per Dr. OZada Finders  Not requiring steroids.  Currently on lamictal 577mdaily.  Medications: Current Outpatient Medications on File  Prior to Visit  Medication Sig Dispense Refill   busPIRone (BUSPAR) 10 MG tablet Take 1 tablet (10 mg total) by mouth 2 (two) times daily. 60 tablet 3   lamoTRIgine (LAMICTAL) 150 MG tablet Take 1 tablet (15074mby mouth two times daily. Take with 100m69mblet.  (250mg54mce daily total) 60 tablet 0   lamoTRIgine (LAMICTAL) 100 MG tablet Take 1 tablet (100mg)58mmouth two times daily. Take along with one 150mg t22mt.  (250mg tw75mdaily total) 60 tablet 0   OLANZapine (ZYPREXA) 20 MG tablet Take 1 tablet by mouth every evening.     sertraline (ZOLOFT) 50 MG tablet Take 1 tablet by mouth once daily (Patient taking differently: Take 50 mg by mouth daily.) 30 tablet 0   traZODone (DESYREL) 100 MG tablet Take 1 tablet (100 mg total) by mouth at bedtime as needed for sleep. (Patient not taking: Reported on 05/22/2021) 30 tablet 3   No current facility-administered medications on file prior to visit.    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    Headache    Psychotic disorder (HCC)    Seizures (HCC)    [place] Surgical History:  Past Surgical History:  Procedure Laterality Date   APPLICATION OF CRANIAL NAVIGATION N/A 07/02/2019   Procedure: APPLICATION OF CRANIAL NAVIGATION;  Surgeon: OstergarJudith Partocation: [facility] OR;  [place]ce: Neurosurgery;  Laterality: N/A;   CRANIOTOMY N/A 07/03/2019   Procedure: CRANIOTOMY HEMATOMA EVACUATION SUBDURAL;  Surgeon: OstergarJudith Partocation: [facility] OR;  [place]: Neurosurgery;  Laterality: N/A;  CRANIOTOMY Right 07/03/2019   Procedure: CRANIOTOMY HEMATOMA EVACUATION SUBDURAL;  Surgeon: Judith Part, MD;  Location: [facility];  Service: Neurosurgery;  Laterality: Right;   CRANIOTOMY Right 07/02/2019   Procedure: CRANIOTOMY TUMOR EXCISION with Lucky Rathke;  Surgeon: Judith Part, MD;  Location: [facility];  Service:  Neurosurgery;  Laterality: Right;  posterior   Social History:  Social History   Socioeconomic History   Marital status: Single    Spouse name: Not on file   Number of children: Not on file   Years of education: Not on file   Highest education level: Not on file  Occupational History   Not on file  Tobacco Use   Smoking status: Every Day    Packs/day: 1.00    Years: 12.00    Total pack years: 12.00    Types: Cigarettes   Smokeless tobacco: Current    Types: Chew  Vaping Use   Vaping Use: Never used  Substance and Sexual Activity   Alcohol use: Yes    Comment: last drink: "3 nights ago" when he had "1/4th of a bottle," has been drinking daily for several weeks from a "few beers to a bottle of liquor"   Drug use: Yes    Types: Cocaine    Comment: last use "couple of weeks ago" when he had "2 lines,"  frequency "once in a blue moon"   Sexual activity: Not Currently  Other Topics Concern   Not on file  Social History Narrative   Not on file   Social Determinants of Health   Financial Resource Strain: Not on file  Food Insecurity: Not on file  Transportation Needs: Not on file  Physical Activity: Not on file  Stress: Not on file  Social Connections: Not on file  Intimate Partner Violence: Not on file   Family History: No family history on file.  Review of Systems: Constitutional: Doesn't report fevers, chills or abnormal weight loss Eyes: Doesn't report blurriness of vision Ears, nose, mouth, throat, and face: Doesn't report sore throat Respiratory: Doesn't report cough, dyspnea or wheezes Cardiovascular: Doesn't report palpitation, chest discomfort  Gastrointestinal:  Doesn't report nausea, constipation, diarrhea GU: Doesn't report incontinence Skin: Doesn't report skin rashes Neurological: Per HPI Musculoskeletal: Doesn't report joint pain Behavioral/Psych: Doesn't report anxiety  Physical Exam: Vitals:   08/23/21 1055  BP: 118/88  Pulse: 86  Resp: 15   Temp: 98.1 F (36.7 C)  SpO2: 95%   KPS: 80. General: Alert, cooperative, pleasant, in no acute distress Head: Normal EENT: No conjunctival injection or scleral icterus.  Lungs: Resp effort normal Cardiac: Regular rate Abdomen: Non-distended abdomen Skin: No rashes cyanosis or petechiae. Extremities: No clubbing or edema  Neurologic Exam: Mental Status: Awake, alert, attentive to examiner. Oriented to self and environment. Language is fluent with intact comprehension.  Cranial Nerves: Visual acuity is grossly normal. Left homonymous hemianopia. Extra-ocular movements intact. No ptosis. Face is symmetric Motor: Tone and bulk are normal. Power is full in both arms and legs. Reflexes are symmetric, no pathologic reflexes present.  Sensory: Intact to light touch Gait: Sensory dystaxia   Labs: I have reviewed the data as listed    Component Value Date/Time   NA 140 05/27/2021 0338   K 3.6 05/27/2021 0338   CL 105 05/27/2021 0338   CO2 22 05/27/2021 0338   GLUCOSE 92 05/27/2021 0338   BUN 8 05/27/2021 0338   CREATININE 0.72 05/27/2021 0338   CREATININE 0.95 05/17/2021 1021   CALCIUM 8.8 (  L) 05/27/2021 0338   PROT 5.2 (L) 05/23/2021 0158   ALBUMIN 3.1 (L) 05/23/2021 0158   AST 39 05/23/2021 0158   AST 37 05/17/2021 1021   ALT 42 05/23/2021 0158   ALT 48 (H) 05/17/2021 1021   ALKPHOS 47 05/23/2021 0158   BILITOT 1.0 05/23/2021 0158   BILITOT 1.1 05/17/2021 1021   GFRNONAA >60 05/27/2021 0338   GFRNONAA >60 05/17/2021 1021   GFRAA >60 12/05/2019 1206   Lab Results  Component Value Date   WBC 5.0 05/27/2021   NEUTROABS 2.2 05/17/2021   HGB 14.4 05/27/2021   HCT 39.8 05/27/2021   MCV 95.7 05/27/2021   PLT 132 (L) 05/27/2021   Imaging:  [facility] Clinician Interpretation: I have personally reviewed the CNS images as listed.  My interpretation, in the context of the patient's clinical presentation, is stable disease  MR BRAIN W WO CONTRAST  Addendum Date: 08/22/2021    ADDENDUM REPORT: 08/22/2021 15:01 ADDENDUM: Dictation transcription error in the impression. Should state subependymal, not subintimal. Electronically Signed   By: Macy Mis M.D.   On: 08/22/2021 15:01   Result Date: 08/22/2021 CLINICAL DATA:  Brain/CNS neoplasm, assess treatment response; anaplastic oligodendroglioma EXAM: MRI HEAD WITHOUT AND WITH CONTRAST TECHNIQUE: Multiplanar, multiecho pulse sequences of the brain and surrounding structures were obtained without and with intravenous contrast. CONTRAST:  50m GADAVIST GADOBUTROL 1 MMOL/ML IV SOLN COMPARISON:  05/14/2021 FINDINGS: Brain: Right parieto-occipital postoperative and treatment changes with areas of cystic encephalomalacia and ex vacuo dilatation of the occipital and temporal horns. Extent of T2 FLAIR hyperintensity in this region is stable. There is persistent subependymal enhancement and this appears stable to mildly decreased. As before, there is right superior cerebral convexity mild dural thickening and enhancement. No new abnormal enhancement. New focus of susceptibility along the lateral margin of the right occipital horn likely reflects microhemorrhage. No acute infarction. Ventricles are stable in size. No significant mass effect. Vascular: Major vessel flow voids at the skull base are preserved. Right jugular bulb diverticulum. Skull and upper cervical spine: Normal marrow signal is preserved. Sinuses/Orbits: Paranasal sinuses are aerated. Orbits are unremarkable. Other: Sella is unremarkable.  Mastoid air cells are clear. IMPRESSION: Stable to mildly decreased subintimal enhancement along the right occipital horn. No new abnormal enhancement and otherwise no significant change since 05/14/2021. Electronically Signed: By: PMacy MisM.D. On: 08/20/2021 09:46    Assessment/Plan Anaplastic oligodendroglioma, 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[facility] [C71.9]  Coby L Janczak is clinically stable today.  No recurrence of seizures  since hospitalization.  MRI brain demonstrates improvement or resolution of foci of enhancement along ependymal surface of the resection cavity.  We again recommended continued imaging surveillance only at this time, he is agreeable with this plan.   For seizures, we recommended continuing Lamictal 250/250.  We ask that BAadil SurBridgers return to clinic in 4 months following next brain MRI, or sooner as needed.    All questions were answered. The patient knows to call the clinic with any problems, questions or concerns. No barriers to learning were detected.  I have spent a total of 30 minutes of face-to-face and non-face-to-face time, excluding clinical staff time, preparing to see patient, ordering tests and/or medications, counseling the patient, and independently interpreting results and communicating results to the patient/family/caregiver    ZVentura Sellers MD Medical Director of Neuro-Oncology [facility]at [facility]06/12/23 11:00 AM

## 2021-08-25 ENCOUNTER — Other Ambulatory Visit: Payer: Self-pay | Admitting: Radiation Therapy

## 2021-09-29 ENCOUNTER — Other Ambulatory Visit: Payer: Self-pay | Admitting: Internal Medicine

## 2021-09-29 DIAGNOSIS — F251 Schizoaffective disorder, depressive type: Secondary | ICD-10-CM

## 2021-09-30 NOTE — Telephone Encounter (Signed)
Dose is incorrect.

## 2021-10-04 ENCOUNTER — Other Ambulatory Visit: Payer: Self-pay

## 2021-10-07 ENCOUNTER — Other Ambulatory Visit: Payer: Self-pay

## 2021-10-12 ENCOUNTER — Other Ambulatory Visit: Payer: Self-pay

## 2021-10-14 ENCOUNTER — Other Ambulatory Visit: Payer: Self-pay

## 2021-10-28 ENCOUNTER — Other Ambulatory Visit: Payer: Self-pay | Admitting: Internal Medicine

## 2021-10-28 DIAGNOSIS — F251 Schizoaffective disorder, depressive type: Secondary | ICD-10-CM

## 2021-11-01 ENCOUNTER — Encounter: Payer: Self-pay | Admitting: Internal Medicine

## 2021-11-13 ENCOUNTER — Other Ambulatory Visit: Payer: Self-pay

## 2021-11-13 ENCOUNTER — Emergency Department (HOSPITAL_COMMUNITY)
Admission: EM | Admit: 2021-11-13 | Discharge: 2021-11-13 | Disposition: A | Discharge status: Home / Self Care | Payer: Medicaid Other | Attending: Emergency Medicine | Admitting: Emergency Medicine

## 2021-11-13 ENCOUNTER — Emergency Department (HOSPITAL_COMMUNITY): Payer: Medicaid Other

## 2021-11-13 ENCOUNTER — Encounter (HOSPITAL_COMMUNITY): Payer: Self-pay | Admitting: Emergency Medicine

## 2021-11-13 ENCOUNTER — Encounter: Payer: Self-pay | Admitting: Internal Medicine

## 2021-11-13 DIAGNOSIS — G40909 Epilepsy, unspecified, not intractable, without status epilepticus: Secondary | ICD-10-CM | POA: Insufficient documentation

## 2021-11-13 DIAGNOSIS — R569 Unspecified convulsions: Secondary | ICD-10-CM | POA: Diagnosis present

## 2021-11-13 DIAGNOSIS — R000 Tachycardia, unspecified: Secondary | ICD-10-CM | POA: Diagnosis not present

## 2021-11-13 LAB — CBC WITH DIFFERENTIAL/PLATELET
Abs Immature Granulocytes: 0.09 10*3/uL — ABNORMAL HIGH (ref 0.00–0.07)
Basophils Absolute: 0 10*3/uL (ref 0.0–0.1)
Basophils Relative: 0 %
Eosinophils Absolute: 0 10*3/uL (ref 0.0–0.5)
Eosinophils Relative: 0 %
HCT: 46.7 % (ref 39.0–52.0)
Hemoglobin: 16.7 g/dL (ref 13.0–17.0)
Immature Granulocytes: 2 %
Lymphocytes Relative: 9 %
Lymphs Abs: 0.4 10*3/uL — ABNORMAL LOW (ref 0.7–4.0)
MCH: 35.2 pg — ABNORMAL HIGH (ref 26.0–34.0)
MCHC: 35.8 g/dL (ref 30.0–36.0)
MCV: 98.5 fL (ref 80.0–100.0)
Monocytes Absolute: 0.2 10*3/uL (ref 0.1–1.0)
Monocytes Relative: 5 %
Neutro Abs: 4.1 10*3/uL (ref 1.7–7.7)
Neutrophils Relative %: 84 %
Platelets: 148 10*3/uL — ABNORMAL LOW (ref 150–400)
RBC: 4.74 MIL/uL (ref 4.22–5.81)
RDW: 11.6 % (ref 11.5–15.5)
WBC: 4.9 10*3/uL (ref 4.0–10.5)
nRBC: 0 % (ref 0.0–0.2)

## 2021-11-13 LAB — COMPREHENSIVE METABOLIC PANEL
ALT: 64 U/L — ABNORMAL HIGH (ref 0–44)
AST: 64 U/L — ABNORMAL HIGH (ref 15–41)
Albumin: 4.1 g/dL (ref 3.5–5.0)
Alkaline Phosphatase: 62 U/L (ref 38–126)
Anion gap: 14 (ref 5–15)
BUN: 8 mg/dL (ref 6–20)
CO2: 21 mmol/L — ABNORMAL LOW (ref 22–32)
Calcium: 8.8 mg/dL — ABNORMAL LOW (ref 8.9–10.3)
Chloride: 105 mmol/L (ref 98–111)
Creatinine, Ser: 1.05 mg/dL (ref 0.61–1.24)
GFR, Estimated: >60 mL/min (ref >60)
Glucose, Bld: 110 mg/dL — ABNORMAL HIGH (ref 70–99)
Potassium: 3.8 mmol/L (ref 3.5–5.1)
Sodium: 140 mmol/L (ref 135–145)
Total Bilirubin: 0.5 mg/dL (ref 0.3–1.2)
Total Protein: 6.7 g/dL (ref 6.5–8.1)

## 2021-11-13 LAB — I-STAT CHEM 8, ED
BUN: 7 mg/dL (ref 6–20)
Calcium, Ion: 1.09 mmol/L — ABNORMAL LOW (ref 1.15–1.40)
Chloride: 102 mmol/L (ref 98–111)
Creatinine, Ser: 0.9 mg/dL (ref 0.61–1.24)
Glucose, Bld: 106 mg/dL — ABNORMAL HIGH (ref 70–99)
HCT: 48 % (ref 39.0–52.0)
Hemoglobin: 16.3 g/dL (ref 13.0–17.0)
Potassium: 3.7 mmol/L (ref 3.5–5.1)
Sodium: 138 mmol/L (ref 135–145)
TCO2: 21 mmol/L — ABNORMAL LOW (ref 22–32)

## 2021-11-13 LAB — SALICYLATE LEVEL: Salicylate Lvl: <7 mg/dL — ABNORMAL LOW (ref 7.0–30.0)

## 2021-11-13 LAB — ACETAMINOPHEN LEVEL: Acetaminophen (Tylenol), Serum: <10 ug/mL — ABNORMAL LOW (ref 10–30)

## 2021-11-13 LAB — ETHANOL: Alcohol, Ethyl (B): <10 mg/dL (ref <10)

## 2021-11-13 LAB — MAGNESIUM: Magnesium: 2.3 mg/dL (ref 1.7–2.4)

## 2021-11-13 LAB — CBG MONITORING, ED: Glucose-Capillary: 116 mg/dL — ABNORMAL HIGH (ref 70–99)

## 2021-11-13 MED ORDER — CHLORDIAZEPOXIDE HCL 25 MG PO CAPS
25.0000 mg | ORAL_CAPSULE | Freq: Once | ORAL | Status: AC
Start: 2021-11-13 — End: 2021-11-13
  Administered 2021-11-13: 25 mg via ORAL
  Filled 2021-11-13: qty 1

## 2021-11-13 MED ORDER — ONDANSETRON HCL 4 MG/2ML IJ SOLN
4.0000 mg | Freq: Once | INTRAMUSCULAR | Status: AC
  Administered 2021-11-13: 4 mg via INTRAVENOUS
  Filled 2021-11-13: qty 2

## 2021-11-13 MED ORDER — LACTATED RINGERS IV BOLUS
1000.0000 mL | Freq: Once | INTRAVENOUS | Status: AC
  Administered 2021-11-13: 1000 mL via INTRAVENOUS

## 2021-11-13 MED ORDER — LAMOTRIGINE 25 MG PO TABS
250.0000 mg | ORAL_TABLET | Freq: Once | ORAL | Status: AC
Start: 2021-11-13 — End: 2021-11-13
  Administered 2021-11-13: 250 mg via ORAL
  Filled 2021-11-13: qty 10

## 2021-11-13 MED ORDER — IBUPROFEN 400 MG PO TABS
600.0000 mg | ORAL_TABLET | Freq: Once | ORAL | Status: AC
  Administered 2021-11-13: 600 mg via ORAL
  Filled 2021-11-13: qty 1

## 2021-11-13 NOTE — Discharge Instructions (Signed)
Continue to take your Lamictal as prescribed.  Follow-up with your neurologist to discuss home medications for further seizure prevention.  Return to the emergency department at any time for any recurrence of symptoms.  Per [facility] statutes, patients with seizures are not allowed to drive until  they have been seizure-free for six months. Use caution when using heavy equipment or power tools. Avoid working on ladders or at heights. Take showers instead of baths. Ensure the water temperature is not too high on the home water heater. Do not go swimming alone. When caring for infants or small children, sit down when holding, feeding, or changing them to minimize risk of injury to the child in the event you have a seizure.   Also, Maintain good sleep hygiene. Avoid alcohol.

## 2021-11-13 NOTE — ED Provider Notes (Signed)
[facility] EMERGENCY DEPARTMENT Provider Note   CSN: [id number] Arrival date & time:        History  Chief Complaint  Patient presents with   Seizures    Stephen Beard is a 26 y.o. male.  HPI Patient presents for seizures.  Medical history includes occipital lobe oligodendroglioma, seizures, schizoaffective disorder, anxiety, depression, intentional drug overdose, alcohol abuse, ADHD.  He underwent radiation as well as craniotomy and resection with Dr. Venetia Constable 2 years ago.  He has been on chemotherapy for the past 2 years.  He was hospitalized in March of this year for status epilepticus.  He is prescribed Lamictal for AED.  Prior to arrival today, patient had 4 witnessed seizures.  The first 3 were witnessed by family.  Fire department witnessed a fourth.  EMS provided Versed.  Patient has been postictal since that time.  History per mother: Patient has not had any recent illicit drug use that she knows of.  He did drink alcohol last night.  This morning he seemed to be in his normal state of health.  He developed what she describes as a partial seizure during which his whole body was shaking but he remained awake, alert, and talking.  During this time, she noticed a left eye and head deviation.  She gave him his morning medication, including his Lamictal.  He subsequently had an episode of vomiting.  He described chest muscle spasms and then went into a grand mall seizure.  This lasted for approximately 10 minutes, during which EMS was called.  He had cessation of tonic-clonic activity followed by recurrence.  This recurrent episode was witnessed by first responders on scene.    Home Medications Prior to Admission medications   Medication Sig Start Date End Date Taking? Authorizing Provider  Acetaminophen (TYLENOL PO) Take 2 tablets by mouth as needed (headache/pain).   Yes [provider]  ibuprofen (ADVIL) 200 MG tablet Take 400-600 mg by mouth as  needed for headache (pain).   Yes [provider]  lamoTRIgine (LAMICTAL) 200 MG tablet Take 200 mg by mouth 2 (two) times daily. 08/27/21  Yes [provider]  lamoTRIgine (LAMICTAL) 25 MG tablet Take 50 mg by mouth 2 (two) times daily. 09/22/21  Yes [provider]  traZODone (DESYREL) 100 MG tablet TAKE 1 TABLET BY MOUTH AT BEDTIME AS NEEDED FOR SLEEP Patient taking differently: Take 100-200 mg by mouth at bedtime as needed for sleep. 11/01/21  Yes Vaslow, Acey Lav, MD  busPIRone (BUSPAR) 10 MG tablet Take 1 tablet (10 mg total) by mouth 2 (two) times daily. Patient not taking: Reported on 11/13/2021 02/15/21   [name], MD  lamoTRIgine (LAMICTAL) 100 MG tablet Take 1 tablet ('100mg'$ ) by mouth two times daily. Take along with one '150mg'$  tablet.  ('250mg'$  twice daily total) Patient not taking: Reported on 11/13/2021 05/27/21 08/23/21  Harvie Heck, MD  lamoTRIgine (LAMICTAL) 150 MG tablet Take 1 tablet ('150mg'$ ) by mouth two times daily. Take with '100mg'$  tablet.  ('250mg'$  twice daily total) Patient not taking: Reported on 11/13/2021 05/27/21   Harvie Heck, MD  OLANZapine (ZYPREXA) 20 MG tablet Take 1 tablet by mouth every evening. Patient not taking: Reported on 11/13/2021 10/27/20   [provider]  sertraline (ZOLOFT) 50 MG tablet Take 1 tablet by mouth once daily Patient not taking: Reported on 11/13/2021 04/02/21   [name], MD      Allergies    Patient has no known allergies.  Review of Systems   Review of Systems  Unable to perform ROS: Mental status change  Neurological:  Positive for seizures.    Physical Exam Updated Vital Signs BP 111/68   Pulse 76   Temp 98.1 F (36.7 C) (Oral)   Resp (!) 22   SpO2 97%  Physical Exam Vitals and nursing note reviewed.  Constitutional:      General: He is not in acute distress.    Appearance: He is well-developed and normal weight. He is ill-appearing. He is not toxic-appearing or diaphoretic.  HENT:      Head: Normocephalic and atraumatic.     Right Ear: External ear normal.     Left Ear: External ear normal.     Nose: Nose normal.     Mouth/Throat:     Mouth: Mucous membranes are moist.     Pharynx: Oropharynx is clear.     Comments: No tongue bite Eyes:     Comments: Bilateral conjunctival injection  Cardiovascular:     Rate and Rhythm: Regular rhythm. Tachycardia present.     Heart sounds: No murmur heard. Pulmonary:     Effort: Pulmonary effort is normal. No respiratory distress.     Breath sounds: Normal breath sounds. No wheezing, rhonchi or rales.  Chest:     Chest wall: No tenderness.  Abdominal:     General: There is no distension.     Palpations: Abdomen is soft.     Tenderness: There is no abdominal tenderness.  Musculoskeletal:        General: No swelling. Normal range of motion.     Cervical back: Neck supple. No rigidity.     Right lower leg: No edema.     Left lower leg: No edema.  Skin:    General: Skin is warm and dry.     Coloration: Skin is not jaundiced or pale.  Neurological:     General: No focal deficit present.     Mental Status: He is alert and oriented to person, place, and time.  Psychiatric:        Behavior: Behavior is uncooperative, slowed and withdrawn.     ED Results / Procedures / Treatments   Labs (all labs ordered are listed, but only abnormal results are displayed) Labs Reviewed  COMPREHENSIVE METABOLIC PANEL - Abnormal; Notable for the following components:      Result Value   CO2 21 (*)    Glucose, Bld 110 (*)    Calcium 8.8 (*)    AST 64 (*)    ALT 64 (*)    All other components within normal limits  CBC WITH DIFFERENTIAL/PLATELET - Abnormal; Notable for the following components:   MCH 35.2 (*)    Platelets 148 (*)    Lymphs Abs 0.4 (*)    Abs Immature Granulocytes 0.09 (*)    All other components within normal limits  SALICYLATE LEVEL - Abnormal; Notable for the following components:   Salicylate Lvl <9.6 (*)    All  other components within normal limits  ACETAMINOPHEN LEVEL - Abnormal; Notable for the following components:   Acetaminophen (Tylenol), Serum <10 (*)    All other components within normal limits  CBG MONITORING, ED - Abnormal; Notable for the following components:   Glucose-Capillary 116 (*)    All other components within normal limits  I-STAT CHEM 8, ED - Abnormal; Notable for the following components:   Glucose, Bld 106 (*)    Calcium, Ion 1.09 (*)  TCO2 21 (*)    All other components within normal limits  MAGNESIUM  ETHANOL  URINALYSIS, ROUTINE W REFLEX MICROSCOPIC  RAPID URINE DRUG SCREEN, HOSP PERFORMED  LAMOTRIGINE LEVEL    EKG EKG Interpretation  Date/Time:  Saturday November 13 2021 09:52:34 EDT Ventricular Rate:  125 PR Interval:  122 QRS Duration: 94 QT Interval:  326 QTC Calculation: 471 R Axis:   128 Text Interpretation: Sinus tachycardia Right axis deviation Borderline prolonged QT interval Confirmed by Godfrey Pick [phone]) on 11/13/2021 10:55:57 AM  Radiology CT HEAD WO CONTRAST  Result Date: 11/13/2021 CLINICAL DATA:  Mental status changes. 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 Seizure disorder. EXAM: CT HEAD WITHOUT CONTRAST TECHNIQUE: Contiguous axial images were obtained from the base of the skull through the vertex without intravenous contrast. RADIATION DOSE REDUCTION: This exam was performed according to the departmental dose-optimization program which includes automated exposure control, adjustment of the mA and/or kV according to patient size and/or use of iterative reconstruction technique. COMPARISON:  05/23/2021 FINDINGS: Brain: As before, right parieto-occipital postoperative and post treatment changes are evident, similar. Ex vacuo dilatation of occipital and temporal horns of the lateral ventricle again noted. Overall appearance is not substantially changed since CT of 05/23/2021. No evidence for acute hemorrhage,  acute ischemia, or midline shift on the current exam. Extra-axial low density seen in the right frontal parietal region previously has decreased in the interval. Vascular: No hyperdense vessel or unexpected calcification. Skull: Right parietal craniotomy defect again noted. Sinuses/Orbits: The visualized paranasal sinuses and mastoid air cells are clear. Visualized portions of the globes and intraorbital fat are unremarkable. Other: None. IMPRESSION: 1. Stable postoperative and post treatment changes in the right parieto-occipital region. 2. Right frontoparietal extra-axial low-density fluid collection seen previously has decreased in the interval. 3. No acute or progressive interval findings. Electronically Signed   By: Misty Stanley M.D.   On: 11/13/2021 11:14    Procedures Procedures    Medications Ordered in ED Medications  lactated ringers bolus 1,000 mL (0 mLs Intravenous Stopped 11/13/21 1201)  lamoTRIgine (LAMICTAL) tablet 250 mg (250 mg Oral Given 11/13/21 1125)  chlordiazePOXIDE (LIBRIUM) capsule 25 mg (25 mg Oral Given 11/13/21 1126)  ondansetron (ZOFRAN) injection 4 mg (4 mg Intravenous Given 11/13/21 1126)  ibuprofen (ADVIL) tablet 600 mg (600 mg Oral Given 11/13/21 1307)    ED Course/ Medical Decision Making/ A&P                           Medical Decision Making Amount and/or Complexity of Data Reviewed Labs: ordered. Radiology: ordered.  Risk Prescription drug management.   This patient presents to the ED for concern of seizures, this involves an extensive number of treatment options, and is a complaint that carries with it a high risk of complications and morbidity.  The differential diagnosis includes status epilepticus, medication nonadherence, alcohol withdrawal, worsening/recurrence of neoplasm, infection, head trauma   Co morbidities that complicate the patient evaluation  occipital lobe oligodendroglioma, seizures, schizoaffective disorder, anxiety, depression, intentional  drug overdose, alcohol abuse, ADHD   Additional history obtained:  Additional history obtained from patient's mother, EMS External records from outside source obtained and reviewed including EMR   Lab Tests:  I Ordered, and personally interpreted labs.  The pertinent results include: Normal hemoglobin, no leukocytosis, normal electrolytes, normal kidney function, slight decrease in bicarb that is likely secondary to lactic acidosis from seizures.  Imaging Studies ordered:  I ordered imaging studies including CT head I independently visualized and interpreted imaging which showed no acute findings I agree with the radiologist interpretation   Cardiac Monitoring: / EKG:  The patient was maintained on a cardiac monitor.  I personally viewed and interpreted the cardiac monitored which showed an underlying rhythm of: Sinus rhythm   Consultations Obtained:  I requested consultation with the neurologist, Dr. Leonel Ramsay,  and discussed lab and imaging findings as well as pertinent plan - they recommend: Maintaining current dose of Lamictal and outpatient neurology follow-up   Problem List / ED Course / Critical interventions / Medication management  Patient presents for multiple witnessed seizures.  Initial seizures were witnessed by his mother who describes them as partial seizures involving full body shaking movements.  This reportedly progressed to grand mal seizure witnessed by both mother and first responders on scene.  EMS gave 5 mg of Versed prior to arrival.  On arrival, patient is somnolent but able to answer questions appropriately.  Patient endorses daily alcohol use.  Alcohol intake last night was described as 2, 40 ounce beers and 1 large mikes hard lemonade.  Vital signs on arrival in the ED are notable for tachycardia.  Patient was given IV fluids and diagnostic work-up was initiated.  Obtain corroborating information from his mother, who witnessed the episodes earlier  today.  She states that he was given his Lamictal but subsequently threw up.  Patient was given his home dose of Lamictal here in the ED.  He was given a dose of Librium for empiric treatment of possible alcohol withdrawal component to his seizure episodes today.  Patient had continued improvement in his mentation while in the ED.  He did return to mental baseline.  This was confirmed with his mother, who now joins him at bedside.  Laboratory work-up was unremarkable.  CT scan did not show any acute findings.  Patient endorsed a headache and was given ibuprofen.  He was able to tolerate p.o. intake.  I discussed this patient with neurologist on-call, Dr. Leonel Ramsay.  Dr. Leonel Ramsay agrees that there may be an alcohol withdrawal component.  He agrees with dose of Librium.  Due to this history, he does not feel that any changes in AEDs are indicated at this time.  He does agree with checking a Lamictal level and having the patient follow-up with outpatient neurology.  Patient and mother are comfortable with this plan.  Patient was discharged in stable condition. I ordered medication including IV fluids for hydration; Lamictal for AED; Librium for empiric treatment of alcohol withdrawal; ibuprofen for headache; Zofran for nausea Reevaluation of the patient after these medicines showed that the patient resolved I have reviewed the patients home medicines and have made adjustments as needed   Social Determinants of Health:  Has access to outpatient care         Final Clinical Impression(s) / ED Diagnoses Final diagnoses:  Seizure [facility])    Rx / DC Orders ED Discharge Orders     None         Godfrey Pick, MD 11/13/21 [phone]

## 2021-11-13 NOTE — ED Triage Notes (Addendum)
Patient BIB [facility] from home after having 3 witnessed seizures with family and 1 seizure in front of EMS. Patient given 5 mg of versed PTA which stopped seizure like activity. Patient does have a hx of brain cancer and is taking seizure medication (lamictal) and per family has not had seizures in "a while" however last night patient was drinking EtOH (2 40 ozs) Patient presents post ictal but responsive to voice at this time. VSS. HR tachycardic with EMS at 144, BP 127/73, CBG 124. Patient was 88% SpO2 on RA and improved to 93% on 4 L.

## 2021-11-15 LAB — LAMOTRIGINE LEVEL: Lamotrigine Lvl: 4.5 ug/mL (ref 2.0–20.0)

## 2021-11-17 ENCOUNTER — Telehealth: Payer: Self-pay

## 2021-11-17 NOTE — Telephone Encounter (Signed)
Call received from pt requesting the following be shared with Dr. Mickeal Skinner:  Requests a refill of Lamictal '200mg'$  tabs to be sent to [facility] on [address] He has discontinued Zyprexa stating it was not working. He is hearing voices, a lady, in his head again. He had a seizure recently (records indicate 11/13/21)

## 2021-11-18 ENCOUNTER — Other Ambulatory Visit: Payer: Self-pay | Admitting: Internal Medicine

## 2021-11-18 MED ORDER — LAMOTRIGINE 100 MG PO TABS: 250.0000 mg | ORAL_TABLET | Freq: Two times a day (BID) | ORAL | 3 refills | Status: DC

## 2021-11-18 MED ORDER — LAMOTRIGINE 150 MG PO TABS: ORAL_TABLET | ORAL | 3 refills | Status: DC

## 2021-11-18 NOTE — Telephone Encounter (Signed)
Attempted to phone patient and number on file is incorrect.  Attempted mother and found number he called on and left message.  Need to update when he calls back.

## 2021-11-28 ENCOUNTER — Ambulatory Visit (HOSPITAL_COMMUNITY)
Admission: RE | Admit: 2021-11-28 | Discharge: 2021-11-28 | Disposition: A | Discharge status: Home / Self Care | Payer: Medicaid Other | Attending: Psychiatry | Admitting: Psychiatry

## 2021-11-28 ENCOUNTER — Other Ambulatory Visit: Payer: Self-pay

## 2021-11-28 ENCOUNTER — Emergency Department (HOSPITAL_COMMUNITY)
Admission: EM | Admit: 2021-11-28 | Discharge: 2021-12-01 | Disposition: A | Discharge status: Home / Self Care | Payer: Medicaid Other | Attending: Emergency Medicine | Admitting: Emergency Medicine

## 2021-11-28 ENCOUNTER — Encounter: Payer: Self-pay | Admitting: Internal Medicine

## 2021-11-28 DIAGNOSIS — Z20822 Contact with and (suspected) exposure to covid-19: Secondary | ICD-10-CM | POA: Insufficient documentation

## 2021-11-28 DIAGNOSIS — F22 Delusional disorders: Secondary | ICD-10-CM | POA: Insufficient documentation

## 2021-11-28 DIAGNOSIS — F259 Schizoaffective disorder, unspecified: Secondary | ICD-10-CM

## 2021-11-28 DIAGNOSIS — F251 Schizoaffective disorder, depressive type: Secondary | ICD-10-CM | POA: Insufficient documentation

## 2021-11-28 DIAGNOSIS — F19959 Other psychoactive substance use, unspecified with psychoactive substance-induced psychotic disorder, unspecified: Secondary | ICD-10-CM

## 2021-11-28 DIAGNOSIS — R440 Auditory hallucinations: Secondary | ICD-10-CM | POA: Diagnosis present

## 2021-11-28 DIAGNOSIS — R519 Headache, unspecified: Secondary | ICD-10-CM

## 2021-11-28 DIAGNOSIS — F1510 Other stimulant abuse, uncomplicated: Secondary | ICD-10-CM

## 2021-11-28 DIAGNOSIS — F333 Major depressive disorder, recurrent, severe with psychotic symptoms: Secondary | ICD-10-CM | POA: Diagnosis present

## 2021-11-28 LAB — COMPREHENSIVE METABOLIC PANEL
ALT: 41 U/L (ref 0–44)
AST: 33 U/L (ref 15–41)
Albumin: 4.7 g/dL (ref 3.5–5.0)
Alkaline Phosphatase: 61 U/L (ref 38–126)
Anion gap: 13 (ref 5–15)
BUN: 14 mg/dL (ref 6–20)
CO2: 26 mmol/L (ref 22–32)
Calcium: 10.3 mg/dL (ref 8.9–10.3)
Chloride: 97 mmol/L — ABNORMAL LOW (ref 98–111)
Creatinine, Ser: 1.12 mg/dL (ref 0.61–1.24)
GFR, Estimated: >60 mL/min (ref >60)
Glucose, Bld: 108 mg/dL — ABNORMAL HIGH (ref 70–99)
Potassium: 4.4 mmol/L (ref 3.5–5.1)
Sodium: 136 mmol/L (ref 135–145)
Total Bilirubin: 1.3 mg/dL — ABNORMAL HIGH (ref 0.3–1.2)
Total Protein: 7.8 g/dL (ref 6.5–8.1)

## 2021-11-28 LAB — CBC
HCT: 50.4 % (ref 39.0–52.0)
Hemoglobin: 18.3 g/dL — ABNORMAL HIGH (ref 13.0–17.0)
MCH: 34.7 pg — ABNORMAL HIGH (ref 26.0–34.0)
MCHC: 36.3 g/dL — ABNORMAL HIGH (ref 30.0–36.0)
MCV: 95.6 fL (ref 80.0–100.0)
Platelets: 194 10*3/uL (ref 150–400)
RBC: 5.27 MIL/uL (ref 4.22–5.81)
RDW: 11.4 % — ABNORMAL LOW (ref 11.5–15.5)
WBC: 6.3 10*3/uL (ref 4.0–10.5)
nRBC: 0 % (ref 0.0–0.2)

## 2021-11-28 LAB — SALICYLATE LEVEL: Salicylate Lvl: <7 mg/dL — ABNORMAL LOW (ref 7.0–30.0)

## 2021-11-28 LAB — RESP PANEL BY RT-PCR (FLU A&B, COVID) ARPGX2
Influenza A by PCR: NEGATIVE
Influenza B by PCR: NEGATIVE
SARS Coronavirus 2 by RT PCR: NEGATIVE

## 2021-11-28 LAB — ACETAMINOPHEN LEVEL: Acetaminophen (Tylenol), Serum: <10 ug/mL — ABNORMAL LOW (ref 10–30)

## 2021-11-28 LAB — ETHANOL: Alcohol, Ethyl (B): <10 mg/dL (ref <10)

## 2021-11-28 MED ORDER — LORAZEPAM 2 MG/ML IJ SOLN
1.0000 mg | Freq: Once | INTRAMUSCULAR | Status: DC
  Filled 2021-11-28: qty 1

## 2021-11-28 MED ORDER — NICOTINE 21 MG/24HR TD PT24
21.0000 mg | MEDICATED_PATCH | Freq: Every day | TRANSDERMAL | Status: DC
  Filled 2021-11-28: qty 1

## 2021-11-28 MED ORDER — ACETAMINOPHEN 325 MG PO TABS: 650.0000 mg | ORAL_TABLET | ORAL | Status: DC | PRN

## 2021-11-28 MED ORDER — RISPERIDONE 1 MG PO TBDP: 2.0000 mg | ORAL_TABLET | Freq: Three times a day (TID) | ORAL | Status: DC | PRN

## 2021-11-28 MED ORDER — LAMOTRIGINE 150 MG PO TABS
250.0000 mg | ORAL_TABLET | Freq: Two times a day (BID) | ORAL | Status: DC
  Administered 2021-11-28 – 2021-11-30 (×5): 250 mg via ORAL
  Filled 2021-11-28 (×5): qty 1

## 2021-11-28 MED ORDER — SERTRALINE HCL 50 MG PO TABS
50.0000 mg | ORAL_TABLET | Freq: Every day | ORAL | Status: DC
  Filled 2021-11-28: qty 1

## 2021-11-28 MED ORDER — ZIPRASIDONE MESYLATE 20 MG IM SOLR: 20.0000 mg | INTRAMUSCULAR | Status: DC | PRN

## 2021-11-28 MED ORDER — LORAZEPAM 1 MG PO TABS: 1.0000 mg | ORAL_TABLET | ORAL | Status: DC | PRN

## 2021-11-28 MED ORDER — BUSPIRONE HCL 10 MG PO TABS
10.0000 mg | ORAL_TABLET | Freq: Two times a day (BID) | ORAL | Status: DC
  Filled 2021-11-28: qty 1

## 2021-11-28 MED ORDER — OLANZAPINE 10 MG PO TABS
20.0000 mg | ORAL_TABLET | Freq: Every evening | ORAL | Status: DC
  Administered 2021-11-29 – 2021-11-30 (×2): 20 mg via ORAL
  Filled 2021-11-28 (×3): qty 2

## 2021-11-28 MED ORDER — ACETAMINOPHEN 500 MG PO TABS: 500.0000 mg | ORAL_TABLET | ORAL | Status: DC | PRN

## 2021-11-28 MED ORDER — ONDANSETRON HCL 4 MG PO TABS: 4.0000 mg | ORAL_TABLET | Freq: Three times a day (TID) | ORAL | Status: DC | PRN

## 2021-11-28 MED ORDER — TRAZODONE HCL 100 MG PO TABS
100.0000 mg | ORAL_TABLET | Freq: Every evening | ORAL | Status: DC | PRN
  Administered 2021-11-29: 100 mg via ORAL
  Filled 2021-11-28: qty 1

## 2021-11-28 NOTE — ED Triage Notes (Addendum)
Patient here with delusions that people in his home are "undercover" and reports AVH which he states he believes may be related to occasional methamphetamine and crack-cocaine use. Patient states that he recognizes the paranoia and hallucinations to be false. Patient denies SI/HI.

## 2021-11-28 NOTE — ED Provider Notes (Addendum)
[place] EMERGENCY DEPARTMENT Provider Note   CSN: [id number] Arrival date & time: 11/28/21  1759     History  Chief Complaint  Patient presents with   Delusional    Stephen Beard is a 26 y.o. male.  HPI     26 year old male with history of occipital oligodendroglioma status post craniectomy, seizure disorder, polysubstance use, schizoaffective disorder, mood disorder comes in with chief complaint of hallucinations.  Patient admits to using meth, last use was 5 days ago.  He also admits to off-and-on alcohol consumption.  He comes to the ER from behavioral health urgent care because of hallucinations.  Patient is also complaining of some paranoia.  He states that he has people living in his house that are monitoring him.   He denies any headaches or seizures.  He was seen in the behavioral health urgent care, sent to the emergency room for medical clearance.  Home Medications Prior to Admission medications   Medication Sig Start Date End Date Taking? Authorizing Provider  Acetaminophen (TYLENOL PO) Take 2 tablets by mouth as needed (headache/pain).    [provider]  busPIRone (BUSPAR) 10 MG tablet Take 1 tablet (10 mg total) by mouth 2 (two) times daily. Patient not taking: Reported on 11/13/2021 02/15/21   [name], MD  ibuprofen (ADVIL) 200 MG tablet Take 400-600 mg by mouth as needed for headache (pain).    [provider]  lamoTRIgine (LAMICTAL) 100 MG tablet Take 1 tablet ('100mg'$ ) by mouth two times daily. Take along with one '150mg'$  tablet.  ('250mg'$  twice daily total) 11/18/21   Vaslow, Acey Lav, MD  lamoTRIgine (LAMICTAL) 150 MG tablet Take 1 tablet ('150mg'$ ) by mouth two times daily. Take with '100mg'$  tablet.  ('250mg'$  twice daily total) 11/18/21   Vaslow, Acey Lav, MD  OLANZapine (ZYPREXA) 20 MG tablet Take 1 tablet by mouth every evening. Patient not taking: Reported on 11/13/2021 10/27/20   [provider]  sertraline  (ZOLOFT) 50 MG tablet Take 1 tablet by mouth once daily Patient not taking: Reported on 11/13/2021 04/02/21   [name], MD  traZODone (DESYREL) 100 MG tablet TAKE 1 TABLET BY MOUTH AT BEDTIME AS NEEDED FOR SLEEP Patient taking differently: Take 100-200 mg by mouth at bedtime as needed for sleep. 11/01/21   [name], MD      Allergies    Patient has no known allergies.    Review of Systems   Review of Systems  Physical Exam Updated Vital Signs BP 113/86   Pulse 73   Temp 97.6 F (36.4 C) (Oral)   Resp 16   SpO2 100%  Physical Exam Vitals and nursing note reviewed.  Constitutional:      Appearance: He is well-developed.  HENT:     Head: Atraumatic.  Cardiovascular:     Rate and Rhythm: Normal rate.  Pulmonary:     Effort: Pulmonary effort is normal.  Musculoskeletal:     Cervical back: Neck supple.  Skin:    General: Skin is warm.  Neurological:     Mental Status: He is alert and oriented to person, place, and time.     Cranial Nerves: No cranial nerve deficit.     Sensory: No sensory deficit.     Motor: No weakness.  Psychiatric:     Comments: Paranoia, abnormal thought content, auditory hallucinations     ED Results / Procedures / Treatments   Labs (all labs ordered are listed, but only abnormal  results are displayed) Labs Reviewed  COMPREHENSIVE METABOLIC PANEL - Abnormal; Notable for the following components:      Result Value   Chloride 97 (*)    Glucose, Bld 108 (*)    Total Bilirubin 1.3 (*)    All other components within normal limits  SALICYLATE LEVEL - Abnormal; Notable for the following components:   Salicylate Lvl <0.1 (*)    All other components within normal limits  ACETAMINOPHEN LEVEL - Abnormal; Notable for the following components:   Acetaminophen (Tylenol), Serum <10 (*)    All other components within normal limits  CBC - Abnormal; Notable for the following components:   Hemoglobin 18.3 (*)    MCH 34.7 (*)    MCHC 36.3 (*)     RDW 11.4 (*)    All other components within normal limits  RESP PANEL BY RT-PCR (FLU A&B, COVID) ARPGX2  ETHANOL  RAPID URINE DRUG SCREEN, HOSP PERFORMED    EKG None  Radiology No results found.  Procedures Procedures    Medications Ordered in ED Medications  LORazepam (ATIVAN) injection 1 mg (has no administration in time range)  risperiDONE (RISPERDAL M-TABS) disintegrating tablet 2 mg (has no administration in time range)    And  LORazepam (ATIVAN) tablet 1 mg (has no administration in time range)    And  ziprasidone (GEODON) injection 20 mg (has no administration in time range)  acetaminophen (TYLENOL) tablet 650 mg (has no administration in time range)  ondansetron (ZOFRAN) tablet 4 mg (has no administration in time range)  nicotine (NICODERM CQ - dosed in mg/24 hours) patch 21 mg (has no administration in time range)  busPIRone (BUSPAR) tablet 10 mg (has no administration in time range)  lamoTRIgine (LAMICTAL) tablet 250 mg (has no administration in time range)  OLANZapine (ZYPREXA) tablet 20 mg (has no administration in time range)  sertraline (ZOLOFT) tablet 50 mg (has no administration in time range)  traZODone (DESYREL) tablet 100 mg (has no administration in time range)    ED Course/ Medical Decision Making/ A&P Clinical Course as of 11/28/21 2055  Nancy Fetter Nov 28, 2021  2055 CT pending, but effectively patient is cleared for psychiatric evaluation. [AN]    Clinical Course User Index [AN] Varney Biles, MD                           Medical Decision Making Amount and/or Complexity of Data Reviewed Labs: ordered.  Risk OTC drugs. Prescription drug management.   26 year old male comes in with chief complaint of abnormal thought content and hallucination.  He has history of brain tumor, seizure disorder and polysubstance use disorder.  Patient is alert and oriented during my assessment.  He however has some flight of ideas and appears paranoid.  He  denies any headaches.  He does not have any focal neurodeficits on my exam.  I reviewed patient's previous MRIs.  I also reviewed patient's outpatient visit with behavioral health urgent care and with neuro-oncology.  For now, I suspicion is that this is primary psychiatric issue or behavioral issues related to polysubstance use.  Clinical suspicion for brain bleed or worsening of the tumor is low.  I have ordered CT scan of the brain.  I have independently reviewed the labs, they are overall reassuring.  Urine drug screen and COVID-19 test are pending.  I feel comfortable clearing the patient.  CT scan is pending at this time.  Patient might decline the CT scan,  as he did express that feeling to me, in which case I do not think he needs forceful CT under sedation.  Final Clinical Impression(s) / ED Diagnoses Final diagnoses:  Schizoaffective disorder, unspecified type [facility])    Rx / DC Orders ED Discharge Orders     None         Varney Biles, MD 11/28/21 2055    Varney Biles, MD 11/28/21 2055

## 2021-11-28 NOTE — ED Notes (Signed)
Patient asked to speak with off duty officer because he feels he killed someone a few years ago; pt is tearful and states he dose not know who the person is or how to investigate the death; pt believes he will be taken to jail soon for murder; Pt refused all meds except Lamictal; states he wants to be alert to get out of here by 8:30am; Off duty in [place] spoke with patient briefly-Monique,RN

## 2021-11-28 NOTE — H&P (Signed)
Behavioral Health Medical Screening Exam  Stephen Beard is an 26 y.o. 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 Patient currently endorses active paranoia and auditory hallucinations. States he stopped taking psychotropics "months ago" because he didn't feel they were working and then stopped seeing psychiatric provider. Unable to state last time he's been seen. Per chart review, pt reached out to oncologist in reference to auditory hallucinations where he was directed to continue taking his Zyprexa and follow up with psychiatry provider. He endorses increased daily alcohol intake and recent amphetamine use; last drink last night, amphetamine use "days ago". Describes hallucinations as woman from [employer] telling him people are after him, states people in the home "don't seem real. Like they don't feel like they are them". Instead he feels family is plotting against him to set him up with the [employer]. Patient stopped several times grabbing his head in response to voices. He denies any thoughts of wanting to harm himself or anyone. Reports not feeling safe due to the voices. Provider discussed at length the importance of medication and treatment compliance as well as the need to refrain from substances to ensure wellness. Provider described recommendation for observation and process of needing to be cleared/monitored at the Emergency Department.   Collateral:  Stephen Beard (grandmother) 44 Told her he wasn't taking any medication except for the seizure  mediaction. Reports pt was hearing voices last summer while living with her and was taking medication. Says pt has not mentioned voices to her lately; has called her to come get him lately saying family is "acting weird" but said he had been drinking and taking drugs but didn't tell me what he took. Told me he thought there was a plot against him, a set up but never told me who or what. States that pt does live with mother but she is not sure mother isn't indulging in things her self. Reports two past attempts; most recent within past year. Grandmother states she would be willing to allow pt back at her house once cleared off drugs.   Total Time spent with patient: 45 minutes  Psychiatric Specialty Exam: Physical Exam Vitals and nursing note reviewed.  HENT:     Head: Normocephalic.     Nose: Nose normal.     Mouth/Throat:     Mouth: Mucous membranes are moist.     Pharynx: Oropharynx is clear.  Eyes:     Comments: B/l eyes injected  Cardiovascular:     Rate and Rhythm: Normal rate.     Pulses: Normal pulses.  Pulmonary:     Effort: Pulmonary effort is normal.  Abdominal:     Palpations: Abdomen is soft.  Musculoskeletal:        General: Normal range of motion.     Cervical back: Normal range of motion.  Skin:    General: Skin is warm and dry.     Comments: flushed  Neurological:     Mental Status: He is alert and oriented to person, place, and time.  Psychiatric:        Attention and Perception: He perceives auditory and visual hallucinations.  Mood and Affect: Affect is flat.        Speech: Speech normal.        Behavior: Behavior is cooperative.        Thought Content: Thought content is paranoid and delusional. Thought content does not include homicidal or suicidal ideation. Thought content does not include homicidal or suicidal plan.        Judgment: Judgment is impulsive.    Review of Systems  Neurological:  Positive for facial asymmetry.       Hx of brain CA,  crainiotomy  Psychiatric/Behavioral:  Positive for hallucinations.        Substance abuse   There were no vitals taken for this visit.There is no height or weight on file to calculate BMI. General Appearance: Casual Eye Contact:  Fair Speech:  Normal Rate and Slurred Volume:  Normal Mood:  Euthymic Affect:  Non-Congruent Thought Process:  Linear Orientation:  Other:  partial Thought Content:  Delusions, Hallucinations: Auditory, and Paranoid Ideation Suicidal Thoughts:  No Homicidal Thoughts:  No Memory:  Immediate;   Fair Recent;   Fair Judgement:  Poor Insight:  Present and Shallow Psychomotor Activity:  Decreased and walks with limp Concentration: Concentration: Fair and Attention Span: Fair Recall:  [place]: Fair Akathisia:  NA Handed:  Right AIMS (if indicated):    Assets:  Financial Resources/Insurance Resilience Social Support Sleep:     Musculoskeletal: Strength & Muscle Tone: decreased Gait & Station: broad based, limp Patient leans: N/A  There were no vitals taken for this visit.  [place] Scale:  [place] ED from 11/13/2021 in [place] ED to Hosp-Admission (Discharged) from 05/22/2021 in [place] ED from 02/26/2021 in [place] No Risk No Risk No Risk       Recommendations: Patient to be transferred to [facility] for further observation, stabilization, and treatment. EDP Nan[facility] notified. Safe Transport to transport. EMTALA completed.   Inda Merlin, NP 11/28/2021, 5:04 PM

## 2021-11-28 NOTE — ED Notes (Signed)
Pt dressed out in purple scrubs and wanded by security.

## 2021-11-28 NOTE — ED Notes (Signed)
Patient comes to the desk and demands he be arrested; pt is paranoid and anxious but refused some of his meds; [employer] spoke with patient once again; Patient states he will try to get some sleep and returned to his room-Monique,RN

## 2021-11-28 NOTE — ED Notes (Signed)
Pt left stating they no longer wanted to wait. After explaining that delay and that it would be beneficial that he stayed that he would like to leave and get a ride home.

## 2021-11-29 LAB — RAPID URINE DRUG SCREEN, HOSP PERFORMED
Amphetamines: POSITIVE — AB
Barbiturates: NOT DETECTED
Benzodiazepines: POSITIVE — AB
Cocaine: NOT DETECTED
Opiates: NOT DETECTED
Tetrahydrocannabinol: NOT DETECTED

## 2021-11-29 NOTE — ED Notes (Signed)
Med pass without incident. Patient wakes up to eat. Requesting shower. Provided supplies to shower.

## 2021-11-29 NOTE — Consult Note (Signed)
[place] ED ASSESSMENT   Reason for Consult: Hallucinations Referring Physician: Emergency department provider Patient Identification: Stephen Beard MRN:  [id number] ED Chief Complaint: Schizoaffective disorder, depressive type ([place])  Diagnosis:  Principal Problem:   Schizoaffective disorder, depressive type ([place]) Active Problems:   Auditory hallucinations   Severe episode of recurrent major depressive disorder, with psychotic features [facility])   ED Assessment Time Calculation: No data recorded  Subjective:   Stephen Beard is a 26 y.o. male patient admitted with hallucinations.  Patient unable to recall what brought him to the hospital.  He does know that his symptoms got worse after recently using meth.  Historically when patient is under the influence of multiple substances he does present with worsening psychosis, paranoia, delusions, and hallucinations.  Generally he feels very paranoid, and that the police are out to get him.  Patient further reports that he wanted to speak with police, as he knows that he killed someone but did not mean to.  He states ' I think I killed them after my brain surgery.  It was a mistake."  Patient is unable to provide any additional details at this time.  Patient was seen at behavioral health Hospital, prior to coming to the emergency department for medical clearance due to history of seizures.  He is currently receiving outpatient seizure management from neurology.  He currently denies any suicidal ideations, homicidal ideations.   During today's evaluation he is fairly groomed, observed to be lying in a hospital bed.  He is cooperative, pleasant, and actively participates in conversation with this Probation officer.  No family is present at today's evaluation.  Patient continues to endorse having increased psychosis, paranoia, hallucinations.  Patient makes multiple attempts to describe his current state of psychosis and paranoia, although he is unable to complete a  sentence.  At times he does present with some thought blocking, and then asked for the question to be repeated.  He does report compliance with medication although he is unable to name them.  He further admits when he is under the influence of substances, he does not take his medication as he should.  He further reports I know I should not be using methamphetamine, it does not help my mental health.  He is currently on a antipsychotic olanzapine, however continues to endorse ongoing auditory hallucinations.  He denies any previous psychiatric history, although chart review indicates previous history of psychosis, substance-induced psychosis and ADHD.  No urine drug screen available on today's evaluation, have reached out to nursing staff to complete this task.  He denies any current suicidal ideations, and or homicidal ideations.   HPI:  Stephen Beard is a  26 year old male with history of occipital oligodendroglioma status post craniectomy, seizure disorder, polysubstance use, schizoaffective disorder, mood disorder comes in with chief complaint of hallucinations.   Patient admits to using meth, last use was 5 days ago.  He also admits to off-and-on alcohol consumption.  He comes to the ER from behavioral health urgent care because of hallucinations.  Patient is also complaining of some paranoia.  He states that he has people living in his house that are monitoring him.    He denies any headaches or seizures.  He was seen in the behavioral health urgent care, sent to the emergency room for medical clearance.   Past Psychiatric History: Patient reports recent psychiatric evaluation performed at behavioral health center of [facility].  During his visit he was started on Seroquel 50 mg, Wellbutrin XL  150, and buspirone 10 mg p.o. 3 times daily for anxiety.  He denies any previous suicide attempts.  He reports a history of self-harm to include banging of his head when he was younger.  He currently  denies any substance use and or legal charges.  Per chart review he is receiving telepsychiatry services through [facility] from [facility] with a diagnosis of ADHD, schizoaffective disorder, depressive type, focal seizures.  Risk to Self:  Yes Risk to Others:  Denies Prior Inpatient Therapy:  Denies Prior Outpatient Therapy:  See above recent evaluation on October 20  Risk to Self or Others: Is the patient at risk to self? Yes Has the patient been a risk to self in the past 6 months? Yes Has the patient been a risk to self within the distant past? Yes Is the patient a risk to others? Yes Has the patient been a risk to others in the past 6 months? No Has the patient been a risk to others within the distant past? 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[place])    Headache    Psychotic disorder ([place])    Seizures ([place])     Past Surgical History:  Procedure Laterality Date   APPLICATION OF CRANIAL NAVIGATION N/A 07/02/2019   Procedure: APPLICATION OF CRANIAL NAVIGATION;  Surgeon: Judith Part, MD;  Location: [facility];  Service: Neurosurgery;  Laterality: N/A;   CRANIOTOMY N/A 07/03/2019   Procedure: CRANIOTOMY HEMATOMA EVACUATION SUBDURAL;  Surgeon: Judith Part, MD;  Location: [facility];  Service: Neurosurgery;  Laterality: N/A;   CRANIOTOMY Right 07/03/2019   Procedure:  CRANIOTOMY HEMATOMA EVACUATION SUBDURAL;  Surgeon: Judith Part, MD;  Location: [facility];  Service: Neurosurgery;  Laterality: Right;   CRANIOTOMY Right 07/02/2019   Procedure: CRANIOTOMY TUMOR EXCISION with Lucky Rathke;  Surgeon: Judith Part, MD;  Location: [facility];  Service: Neurosurgery;  Laterality: Right;  posterior   Family History: No family history on file. Family Psychiatric  History: [place] Social History:  Social History   Substance and Sexual Activity  Alcohol Use Yes   Comment: last drink: "3 nights ago" when he had "1/4th of a bottle," has been drinking daily for several weeks from a "few beers to a bottle of liquor"     Social History   Substance and Sexual Activity  Drug Use Yes   Types: Cocaine   Comment: last use "couple of weeks ago" when he had "2 lines,"  frequency "once in a blue moon"    Social History   Socioeconomic History   Marital status: Single    Spouse name: Not on file   Number of children: Not on file   Years of education: Not on file   Highest education level: Not on file  Occupational History   Not  on file  Tobacco Use   Smoking status: Every Day    Packs/day: 1.00    Years: 12.00    Total pack years: 12.00    Types: Cigarettes   Smokeless tobacco: Current    Types: Chew  Vaping Use   Vaping Use: Never used  Substance and Sexual Activity   Alcohol use: Yes    Comment: last drink: "3 nights ago" when he had "1/4th of a bottle," has been drinking daily for several weeks from a "few beers to a bottle of liquor"   Drug use: Yes    Types: Cocaine    Comment: last use "couple of weeks ago" when he had "2 lines,"  frequency "once in a blue moon"   Sexual activity: Not Currently  Other Topics Concern   Not on file  Social History Narrative   Not on file   Social Determinants of Health   Financial Resource Strain: Not on file  Food Insecurity: Not on file  Transportation Needs: Not on file  Physical Activity: Not on file   Stress: Not on file  Social Connections: Not on file   Additional Social History:    Allergies:  No Known Allergies  Labs:  Results for orders placed or performed during the hospital encounter of 11/28/21 (from the past 48 hour(s))  Comprehensive metabolic panel     Status: Abnormal   Collection Time: 11/28/21  6:31 PM  Result Value Ref Range   Sodium 136 135 - 145 mmol/L   Potassium 4.4 3.5 - 5.1 mmol/L   Chloride 97 (L) 98 - 111 mmol/L   CO2 26 22 - 32 mmol/L   Glucose, Bld 108 (H) 70 - 99 mg/dL    Comment: Glucose reference range applies only to samples taken after fasting for at least 8 hours.   BUN 14 6 - 20 mg/dL   Creatinine, Ser 1.12 0.61 - 1.24 mg/dL   Calcium 10.3 8.9 - 10.3 mg/dL   Total Protein 7.8 6.5 - 8.1 g/dL   Albumin 4.7 3.5 - 5.0 g/dL   AST 33 15 - 41 U/L   ALT 41 0 - 44 U/L   Alkaline Phosphatase 61 38 - 126 U/L   Total Bilirubin 1.3 (H) 0.3 - 1.2 mg/dL   GFR, Estimated >60 >60 mL/min    Comment: (NOTE) Calculated using the CKD-EPI Creatinine Equation (2021)    Anion gap 13 5 - 15    Comment: Performed at [facility] [address], [place], [place] [address]  Ethanol     Status: None   Collection Time: 11/28/21  6:31 PM  Result Value Ref Range   Alcohol, Ethyl (B) <10 <10 mg/dL    Comment: (NOTE) Lowest detectable limit for serum alcohol is 10 mg/dL.  For medical purposes only. Performed at [facility], [place] [address] [place], [place] [address]   Salicylate level     Status: Abnormal   Collection Time: 11/28/21  6:31 PM  Result Value Ref Range   Salicylate Lvl <5.6 (L) 7.0 - 30.0 mg/dL    Comment: Performed at [facility] [address], [place], [place] [address]  Acetaminophen level     Status: Abnormal   Collection Time: 11/28/21  6:31 PM  Result Value Ref Range   Acetaminophen (Tylenol), Serum <10 (L) 10 - 30 ug/mL    Comment: (NOTE) Therapeutic concentrations vary significantly. A range of 10-30 ug/mL  may be  an effective concentration for  many patients. However, some  are best treated at concentrations outside of this range. Acetaminophen concentrations >150 ug/mL at 4 hours after ingestion  and >50 ug/mL at 12 hours after ingestion are often associated with  toxic reactions.  Performed at [facility], [place] [address] [place], [place] [address]   cbc     Status: Abnormal   Collection Time: 11/28/21  6:31 PM  Result Value Ref Range   WBC 6.3 4.0 - 10.5 K/uL   RBC 5.27 4.22 - 5.81 MIL/uL   Hemoglobin 18.3 (H) 13.0 - 17.0 g/dL   HCT 50.4 39.0 - 52.0 %   MCV 95.6 80.0 - 100.0 fL   MCH 34.7 (H) 26.0 - 34.0 pg   MCHC 36.3 (H) 30.0 - 36.0 g/dL   RDW 11.4 (L) 11.5 - 15.5 %   Platelets 194 150 - 400 K/uL   nRBC 0.0 0.0 - 0.2 %    Comment: Performed at [facility], [place] [address] [place], [place] [address]  Resp Panel by RT-PCR (Flu A&B, Covid) Anterior Nasal Swab     Status: None   Collection Time: 11/28/21  8:51 PM   Specimen: Anterior Nasal Swab  Result Value Ref Range   SARS Coronavirus 2 by RT PCR NEGATIVE NEGATIVE    Comment: (NOTE) SARS-CoV-2 target nucleic acids are NOT DETECTED.  The SARS-CoV-2 RNA is generally detectable in upper respiratory specimens during the acute phase of infection. The lowest concentration of SARS-CoV-2 viral copies this assay can detect is 138 copies/mL. A negative result does not preclude SARS-Cov-2 infection and should not be used as the sole basis for treatment or other patient management decisions. A negative result may occur with  improper specimen collection/handling, submission of specimen other than nasopharyngeal swab, presence of viral mutation(s) within the areas targeted by this assay, and inadequate number of viral copies(<138 copies/mL). A negative result must be combined with clinical observations, patient history, and epidemiological information. The expected result is Negative.  Fact Sheet for Patients:   EntrepreneurPulse.com.au  Fact Sheet for Healthcare Providers:  IncredibleEmployment.be  This test is no t yet approved or cleared by the [place] FDA and  has been authorized for detection and/or diagnosis of SARS-CoV-2 by FDA under an Emergency Use Authorization (EUA). This EUA will remain  in effect (meaning this test can be used) for the duration of the COVID-19 declaration under Section 564(b)(1) of the Act, 21 U.S.C.section 360bbb-3(b)(1), unless the authorization is terminated  or revoked sooner.       Influenza A by PCR NEGATIVE NEGATIVE   Influenza B by PCR NEGATIVE NEGATIVE    Comment: (NOTE) The Xpert Xpress SARS-CoV-2/FLU/RSV plus assay is intended as an aid in the diagnosis of influenza from Nasopharyngeal swab specimens and should not be used as a sole basis for treatment. Nasal washings and aspirates are unacceptable for Xpert Xpress SARS-CoV-2/FLU/RSV testing.  Fact Sheet for Patients: EntrepreneurPulse.com.au  Fact Sheet for Healthcare Providers: IncredibleEmployment.be  This test is not yet approved or cleared by the [place] FDA and has been authorized for detection and/or diagnosis of SARS-CoV-2 by FDA under an Emergency Use Authorization (EUA). This EUA will remain in effect (meaning this test can be used) for the duration of the COVID-19 declaration under Section 564(b)(1) of the Act, 21 U.S.C. section 360bbb-3(b)(1), unless the authorization is terminated or revoked.  Performed at [facility], [place] [address], [place], [place] [address]     Current Facility-Administered Medications  Medication Dose Route Frequency Provider Last Rate Last Admin   acetaminophen (TYLENOL) tablet 650 mg  650 mg Oral Q4H PRN Varney Biles, MD       lamoTRIgine (LAMICTAL) tablet 250 mg  250 mg Oral BID Kathrynn Humble, Ankit, MD   250 mg at 11/29/21 0926   LORazepam (ATIVAN) injection 1  mg  1 mg Intramuscular Once Kathrynn Humble, Ankit, MD       risperiDONE (RISPERDAL M-TABS) disintegrating tablet 2 mg  2 mg Oral Q8H PRN Kathrynn Humble, Ankit, MD       And   LORazepam (ATIVAN) tablet 1 mg  1 mg Oral PRN Kathrynn Humble, Ankit, MD       And   ziprasidone (GEODON) injection 20 mg  20 mg Intramuscular PRN Nanavati, Ankit, MD       nicotine (NICODERM CQ - dosed in mg/24 hours) patch 21 mg  21 mg Transdermal Daily Nanavati, Ankit, MD       OLANZapine (ZYPREXA) tablet 20 mg  20 mg Oral QPM Nanavati, Ankit, MD       ondansetron (ZOFRAN) tablet 4 mg  4 mg Oral Q8H PRN Nanavati, Ankit, MD       traZODone (DESYREL) tablet 100 mg  100 mg Oral QHS PRN Varney Biles, MD       Current Outpatient Medications  Medication Sig Dispense Refill   Acetaminophen (TYLENOL PO) Take 2 tablets by mouth as needed (headache/pain).     ibuprofen (ADVIL) 200 MG tablet Take 400-600 mg by mouth as needed for headache (pain).     lamoTRIgine (LAMICTAL) 150 MG tablet Take 1 tablet ('150mg'$ ) by mouth two times daily. Take with '100mg'$  tablet.  ('250mg'$  twice daily total) 60 tablet 3   traZODone (DESYREL) 100 MG tablet TAKE 1 TABLET BY MOUTH AT BEDTIME AS NEEDED FOR SLEEP (Patient taking differently: Take 100-200 mg by mouth at bedtime as needed for sleep.) 30 tablet 0   busPIRone (BUSPAR) 10 MG tablet Take 1 tablet (10 mg total) by mouth 2 (two) times daily. (Patient not taking: Reported on 11/13/2021) 60 tablet 3   lamoTRIgine (LAMICTAL) 100 MG tablet Take 1 tablet ('100mg'$ ) by mouth two times daily. Take along with one '150mg'$  tablet.  ('250mg'$  twice daily total) 60 tablet 3   OLANZapine (ZYPREXA) 20 MG tablet Take 1 tablet by mouth every evening. (Patient not taking: Reported on 11/13/2021)     sertraline (ZOLOFT) 50 MG tablet Take 1 tablet by mouth once daily (Patient not taking: Reported on 11/13/2021) 30 tablet 0    Musculoskeletal: Strength & Muscle Tone: within normal limits Gait & Station: normal Patient leans: N/A   Psychiatric  Specialty Exam: Presentation  General Appearance: Disheveled  Eye Contact:Fleeting  Speech:Blocked; Normal Rate  Speech Volume:Normal  Handedness:Right   Mood and Affect  Mood:Anxious; Depressed  Affect:Appropriate; Congruent   Thought Process  Thought Processes:Coherent; Linear  Descriptions of Associations:Loose  Orientation:Full (Time, Place and Person)  Thought Content:Illogical  History of Schizophrenia/Schizoaffective disorder:No data recorded Duration of Psychotic Symptoms:No data recorded Hallucinations:Hallucinations: Visual  Ideas of Reference:Delusions; Paranoia  Suicidal Thoughts:Suicidal Thoughts: No  Homicidal Thoughts:Homicidal Thoughts: No   Sensorium  Memory:Immediate Poor; Recent Fair; Remote Fair  Judgment:Poor  Insight:Poor   Executive Functions  Concentration:Fair  Attention Span:Fair  [place]   Psychomotor Activity  Psychomotor Activity:Psychomotor Activity: Normal; Mannerisms   Assets  Assets:Communication Skills; Desire for Improvement; Resilience; Financial Resources/Insurance    Sleep  Sleep:Sleep: Fair   Physical Exam: Physical Exam  Vitals and nursing note reviewed.  Constitutional:      Appearance: Normal appearance. He is normal weight.  Skin:    Capillary Refill: Capillary refill takes less than 2 seconds.  Neurological:     General: No focal deficit present.     Mental Status: He is alert and oriented to person, place, and time. Mental status is at baseline.  Psychiatric:        Attention and Perception: He is inattentive. He perceives auditory hallucinations.        Mood and Affect: Mood is anxious and elated.        Speech: He is noncommunicative. Speech is tangential.        Behavior: Behavior is hyperactive. Behavior is cooperative.        Thought Content: Thought content is paranoid and delusional. Thought content does not include suicidal ideation.  Thought content does not include suicidal plan.        Cognition and Memory: Cognition and memory normal.        Judgment: Judgment is impulsive and inappropriate.    ROS Blood pressure 101/70, pulse 100, temperature 98.3 F (36.8 C), temperature source Oral, resp. rate 16, SpO2 98 %. There is no height or weight on file to calculate BMI.  Medical Decision Making: Patient was brought into behavioral health for evaluation.  He did present voluntarily with a [place] for assessment of auditory hallucinations.  Patient currently endorses active paranoia and hallucinations, delusions.  On exam his current presentation are consistent with suspected substance-induced psychosis versus schizoaffective disorder, depressive type.  No urine drug screening is available at this time to determine if patient is under the influence, although he endorses using amphetamines, marijuana, cocaine, alcohol.  He does have a history of delusions and hallucinations, which all appear to be very similar of limited, long enforcement, state Bureau of investigation, family plotting against him, and that he has killed someone and demanding to speak to the police.  Patient continues to endorse these ongoing symptoms noted above, and meets criteria for inpatient psychiatric admission.   Problem 1: Substance-induced psychosis-recommend obtaining urine drug screen.  Continue with schedule olanzapine 20 mg p.o. nightly, Lamictal 250 mg p.o. twice daily.  Problem 2: Psychosis/agitation-continue with as needed agitation protocol.  Patient did not appear to be disruptive, aggressive, combative, and or violent during this evaluation.  Psychiatry TTS service will continue to monitor.  Disposition: Recommend psychiatric Inpatient admission when medically cleared.  Suella Broad, FNP 11/29/2021 1:03 PM

## 2021-11-29 NOTE — ED Notes (Signed)
Meal given. Pt sleeping at this time.

## 2021-11-29 NOTE — Consult Note (Signed)
History of Oligodendroglioma and seizures. After discussion with Dr. Lovette Cliche recommended to discontinue Buspar and Zoloft due to medical history and current presenting symptoms.

## 2021-11-29 NOTE — ED Notes (Signed)
Urine Sample collected and sent off

## 2021-11-30 DIAGNOSIS — R519 Headache, unspecified: Secondary | ICD-10-CM

## 2021-11-30 NOTE — ED Notes (Signed)
Pt taking a shower 

## 2021-11-30 NOTE — Progress Notes (Signed)
Inpatient Behavioral Health Placement  Pt meets inpatient criteria per Sheran Fava, FNP. There are no available beds at [facility].  Referral was sent to the following facilities;    Destination Service Provider Address Phone Fax  [facility]  [place], [place] [address] [place]  [facility]  [address] [place] [address] [phone]  [facility]  [place], [place] [address] [phone]  [facility]  [place] [place] [address] [place]  [facility]  [address], [place] [address] [phone]  [place] [address], [place] [address] [phone]  [facility]  [address] [place] [address] [phone] [place]  [address] [place] [address] [phone] [place]  [address], [place] [address] [phone] [facility]  [address] [place] [address] [phone] [facility]  [address] [place] [address] [phone]  [facility]  [address] [place], [place] [address] [phone]  [facility]  [address] [place] [address] [phone]  [facility]  [address] [place] [address] [phone]  [facility]  [address] [place] [address] [phone]   Situation ongoing,  CSW will follow up.   Benjaman Kindler, MSW, LCSWA 11/30/2021  @ 11:14 PM

## 2021-11-30 NOTE — Progress Notes (Addendum)
Pt was accepted to [facility] 12/01/21; BED ASSIGNMENT Althia Forts A  Pt meets inpatient criteria per Sheran Fava, FNP  Attending Physician will be Dr. Amador Cunas. [place]  Report can be called to: [phone]  Pt can arrive after 6:00am  Care Team Notified:Malinda Motsinger, RN  Nadara Mode, [place] 11/30/2021 @ 11:35 PM

## 2021-11-30 NOTE — Progress Notes (Signed)
Inpatient Behavioral Health Placement  Pt meets inpatient criteria per Sheran Fava, FNP.  There are no available beds at [facility] per [facility] Lynnda Shields, RN Referral was sent to the following facilities;   Destination Service Provider Address Phone Fax  [facility]  [place], [place] [address] [place]  [facility]  [address] [place] [address] [phone]  [facility]  [place], [place] [address] [phone]  [facility]  [name] [address] [place]  [facility]  [address], [place] [address] [phone]  [place] [address] [place] [address] [phone]  [facility]  [address], [place] [address] [phone] [place]  [address], [place] [address] [phone] [place]  [address] [place] [address] [phone] [facility]  [address], [place] [address] [phone] [facility]  [address] [place] [phone] [place], [place] [address] [phone]    Situation ongoing,  CSW will follow up.   Benjaman Kindler, MSW, LCSWA 11/30/2021  @ 3:19 PM

## 2021-11-30 NOTE — ED Notes (Signed)
Pt made TC to Family to report he was ready to come home . When Pt was finished TC he reported he wanted in Pt treatment.

## 2021-11-30 NOTE — ED Notes (Signed)
Review of chart in purple zone indicates patient is voluntary at this point.

## 2021-11-30 NOTE — Progress Notes (Signed)
Inpatient Behavioral Health Placement  Pt meets inpatient criteria per Sheran Fava, NP. There are no available beds at [facility] per [facility] Lucia Gaskins, RN. Referral was sent to the following facilities;    Destination Service Provider Address Phone Fax  [facility]  [place], [place] [address] [place]  [facility]  [address] [place] [address] [phone]  [facility]  [place], [place] [address] [phone]  [facility]  [place] [address] [place] [address] [place]  [facility]  [address] [place] [address] [phone]  [place] [address], [place] [address] [phone]  [facility]  [address] [place] [address] [phone] [place]  [address], [place] [address] [phone] [place]  [address] [place] [address] [phone] [facility]  [address], [place] [address] [phone] [facility]  [address], [place] [address] [phone] [place], [place] [address] [phone]   Situation ongoing,  CSW will follow up.   Benjaman Kindler, MSW, LCSWA 11/30/2021  @ 3:02 AM

## 2021-11-30 NOTE — ED Notes (Signed)
PT sleeping

## 2021-11-30 NOTE — ED Provider Notes (Signed)
Emergency Medicine Observation Re-evaluation Note  Stephen Beard is a 26 y.o. male, seen on rounds today.  Pt initially presented to the ED for complaints of Delusional Currently, the patient is awaiting psych placement.  Physical Exam  BP 101/70 (BP Location: Right Arm)   Pulse 100   Temp 98 F (36.7 C) (Oral)   Resp 16   SpO2 98%  Physical Exam General: NAD, resting comfortably Cardiac: RRR Lungs: Equal chest rise without tachypnea  Psych: resting comfortably  ED Course / MDM  EKG:EKG Interpretation  Date/Time:  Sunday November 28 2021 20:51:54 EDT Ventricular Rate:  72 PR Interval:  131 QRS Duration: 100 QT Interval:  505 QTC Calculation: 553 R Axis:   -12 Text Interpretation: Sinus rhythm RSR' in V1 or V2, right VCD or RVH Prolonged QT interval Abnormal ECG Confirmed by Carmin Muskrat [phone]) on 11/29/2021 7:54:54 PM  I have reviewed the labs performed to date as well as medications administered while in observation.  Recent changes in the last 24 hours include none.  Plan  Current plan is for awaiting psych placement.      Deno Etienne, DO 11/30/21 1254

## 2021-11-30 NOTE — ED Notes (Signed)
Pt asked if he was IVC . Pt reported he just wanted to go . Pt was told he was not IVC . Pt was asked if he did not want inpatient treatment. Pt replied he did . Pt made TC to Family .

## 2021-11-30 NOTE — ED Notes (Signed)
Pt sleeping. 

## 2021-12-01 NOTE — ED Notes (Signed)
Review of Chart reveals continued voluntary status, and upon discussion with RN in purple zone, patient is being discharged.

## 2021-12-01 NOTE — ED Notes (Signed)
[place] waiting for ride

## 2021-12-01 NOTE — ED Provider Notes (Addendum)
Emergency Medicine Observation Re-evaluation Note  Stephen Beard is a 26 y.o. male, seen on rounds today.  Pt initially presented to the ED for complaints of substance use disorder and delusional disorder. Currently resting, no acute distress.   Physical Exam  BP 95/75 (BP Location: Left Arm)   Pulse 67   Temp 97.8 F (36.6 C) (Oral)   Resp 20   SpO2 98%  Physical Exam General: calm, resting  Cardiac: regular rate Lungs: breathing comfortably Psych: calm, content appearing, not actively responding to internal stimuli.   ED Course / MDM    I have reviewed the labs performed to date as well as medications administered while in observation.  Recent changes in the last 24 hours include ED obs, reassessment.   Plan  Current plan is that pt has been accepted at [facility], Dr  Abbey Chatters - however, on re-eval and discussion with patient he indicates he feels much improved, does not feel would benefit from transfer to inpatient [facility], and requests d/c.  Pt indicates he feels earlier symptoms were due to etoh and methamphetamine abuse. He indicates he 'knows that was stupid', as it has happened in the past.  He does not feel he needs inpatient tx but states is very willing to f/u as outpatient whether for [facility] services or SUD tx.  Pt exhibits normal mood and affect. He does not appear acutely depressed. He is calm, conversant, and thought processes are clear. He is not responding to internal stimuli. No delusions or hallucinations are noted. Pt denies any thoughts of harm to self or others.   Pt currently appears stable for d/c.   Will provide resource guide, and patient is encouraged to f/u closely as outpatient.  Return precautions provided.     Lajean Saver, MD 12/01/21 [phone]

## 2021-12-01 NOTE — ED Notes (Signed)
This Probation officer walked Pt out to [employer]. Mother present to take Pt home.

## 2021-12-01 NOTE — Discharge Instructions (Addendum)
It was our pleasure to provide your ER care today - we hope that you feel better.  Avoid alcohol and/or drug use as it is harmful to your physical health and mental well-being. See resource guide provided as relates treatment program options - follow up with them in the next 1-2 days.   Also follow up closely with primary care doctor and behavioral health specialist in the coming week.  For mental health issues and/or crisis, you may also go directly to the behavioral health urgent care center - it is open 24/7 and walk-ins are welcome.  Return to ER if worse, new symptoms, fevers, chest pain, trouble breathing, or other concern.

## 2021-12-17 ENCOUNTER — Ambulatory Visit (HOSPITAL_COMMUNITY)
Admission: RE | Admit: 2021-12-17 | Discharge: 2021-12-17 | Disposition: A | Discharge status: Home / Self Care | Payer: Medicare Other | Source: Ambulatory Visit | Attending: Internal Medicine | Admitting: Internal Medicine

## 2021-12-17 DIAGNOSIS — C719 Malignant neoplasm of brain, unspecified: Secondary | ICD-10-CM | POA: Diagnosis not present

## 2021-12-17 MED ORDER — GADOPICLENOL 0.5 MMOL/ML IV SOLN
6.0000 mL | Freq: Once | INTRAVENOUS | Status: AC | PRN
  Administered 2021-12-17: 6 mL via INTRAVENOUS

## 2021-12-20 ENCOUNTER — Inpatient Hospital Stay: Payer: Medicare Other | Attending: Internal Medicine | Admitting: Internal Medicine

## 2021-12-20 ENCOUNTER — Other Ambulatory Visit: Payer: Self-pay

## 2021-12-20 ENCOUNTER — Inpatient Hospital Stay: Payer: Medicare Other

## 2021-12-20 VITALS — BP 123/90 | HR 85 | Temp 97.4°F | Resp 18 | Ht 67.0 in | Wt 140.6 lb

## 2021-12-20 DIAGNOSIS — R569 Unspecified convulsions: Secondary | ICD-10-CM

## 2021-12-20 DIAGNOSIS — C714 Malignant neoplasm of occipital lobe: Secondary | ICD-10-CM | POA: Diagnosis present

## 2021-12-20 NOTE — Progress Notes (Signed)
[facility] [place], [place] [address] [phone]   Interval Evaluation  Date of Service: 12/20/21 Patient Name: Stephen Beard Patient MRN: [id number] Patient DOB: 03-06-1996 Provider: [name], MD  Identifying Statement:  Stephen Beard is a 26 y.o. 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 Zada Finders. Followed by hematoma evacuation on 07/03/19.   08/05/2019 - 09/17/2019 Radiation Therapy   IMRT with concurrent Temodar 76m/m2 daily   10/15/2019 -  Chemotherapy    Patient is on Treatment Plan: BRAIN ANAPLASTIC GLIOMA GRADE III TEMOZOLOMIDE POST XRT Q28D        Biomarkers:  MGMT Unknown.  IDH 1/2 Mutated.  EGFR Unknown  1p/19q co-deleted   Interval History:  Stephen Beard presents today for follow up after recent MRI brain.  No major seizures since ICU admission last spring.  He is struggling more with depression these days.  He feels like things would be much better if he had job/work, for which he is still looking.  Otherwise denies new or progressive deficits.  Occassional tension headaches only.    H+P (07/19/19) Patient presented to medical attention with several months of progressive headaches and left sided visual impairment.  This was accompanied by episodes of deja-vu +paroxysmal burning smells which would occur almost daily over that time.  CNS imaging demonstrated large parieto-occipital mass with herniation syndrome.  Mass was resected/debulked, and post-operative course was complicated by intracranial hematoma and also respiratory failure.  At present, he is walking and talking, carries left sided visual impairment.  Currently on antibiotics for wound infection per Dr. OZada Finders  Not requiring steroids.  Currently on lamictal 513m daily.  Medications: Current Outpatient Medications on File Prior to Visit  Medication Sig Dispense Refill   Acetaminophen (TYLENOL PO) Take 2 tablets by mouth as needed (headache/pain).     ibuprofen (ADVIL) 200 MG tablet Take 400-600 mg by mouth as needed for headache (pain).     lamoTRIgine (LAMICTAL) 100 MG tablet Take 1 tablet (1004mby mouth two times daily. Take along with one 150m29mblet.  (250mg52mce daily total) 60 tablet 3   lamoTRIgine (LAMICTAL) 150 MG tablet Take 1 tablet (150mg)70mmouth two times daily. Take with 100mg t51mt.  (250mg tw48mdaily total) 60 tablet 3   OLANZapine (ZYPREXA) 20 MG tablet Take 1 tablet by mouth every evening. (Patient not taking: Reported on 11/13/2021)     traZODone (DESYREL) 100 MG tablet TAKE 1 TABLET BY MOUTH AT BEDTIME AS NEEDED FOR SLEEP (Patient taking differently: Take 100-200 mg by mouth at bedtime as needed for sleep.) 30 tablet 0   No current facility-administered medications on file prior to visit.    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    Headache    Psychotic disorder (HCC)    Seizures (HCC)    [place] Surgical History:  Past Surgical History:  Procedure Laterality Date   APPLICATION OF CRANIAL NAVIGATION N/A 07/02/2019   Procedure: APPLICATION OF CRANIAL NAVIGATION;  Surgeon: OstergarJudith Partocation: [facility] OR;  [place]: Neurosurgery;  Laterality: N/A;   CRANIOTOMY N/A 07/03/2019   Procedure: CRANIOTOMY HEMATOMA EVACUATION SUBDURAL;  Surgeon: OstergarJudith Part  MD;  Location: [facility];  Service: Neurosurgery;  Laterality: N/A;   CRANIOTOMY Right 07/03/2019   Procedure: CRANIOTOMY HEMATOMA EVACUATION SUBDURAL;  Surgeon: Judith Part, MD;  Location: [facility];  Service: Neurosurgery;  Laterality: Right;   CRANIOTOMY Right 07/02/2019   Procedure: CRANIOTOMY TUMOR EXCISION with Lucky Rathke;   Surgeon: Judith Part, MD;  Location: [facility];  Service: Neurosurgery;  Laterality: Right;  posterior   Social History:  Social History   Socioeconomic History   Marital status: Single    Spouse name: Not on file   Number of children: Not on file   Years of education: Not on file   Highest education level: Not on file  Occupational History   Not on file  Tobacco Use   Smoking status: Every Day    Packs/day: 1.00    Years: 12.00    Total pack years: 12.00    Types: Cigarettes   Smokeless tobacco: Current    Types: Chew  Vaping Use   Vaping Use: Never used  Substance and Sexual Activity   Alcohol use: Yes    Comment: last drink: "3 nights ago" when he had "1/4th of a bottle," has been drinking daily for several weeks from a "few beers to a bottle of liquor"   Drug use: Yes    Types: Cocaine    Comment: last use "couple of weeks ago" when he had "2 lines,"  frequency "once in a blue moon"   Sexual activity: Not Currently  Other Topics Concern   Not on file  Social History Narrative   Not on file   Social Determinants of Health   Financial Resource Strain: Not on file  Food Insecurity: Not on file  Transportation Needs: Not on file  Physical Activity: Not on file  Stress: Not on file  Social Connections: Not on file  Intimate Partner Violence: Not on file   Family History: No family history on file.  Review of Systems: Constitutional: Doesn't report fevers, chills or abnormal weight loss Eyes: Doesn't report blurriness of vision Ears, nose, mouth, throat, and face: Doesn't report sore throat Respiratory: Doesn't report cough, dyspnea or wheezes Cardiovascular: Doesn't report palpitation, chest discomfort  Gastrointestinal:  Doesn't report nausea, constipation, diarrhea GU: Doesn't report incontinence Skin: Doesn't report skin rashes Neurological: Per HPI Musculoskeletal: Doesn't report joint pain Behavioral/Psych: Doesn't report anxiety  Physical  Exam: Vitals:   12/20/21 1047  BP: (!) 123/90  Pulse: 85  Resp: 18  Temp: (!) 97.4 F (36.3 C)  SpO2: 98%   KPS: 80. General: Alert, cooperative, pleasant, in no acute distress Head: Normal EENT: No conjunctival injection or scleral icterus.  Lungs: Resp effort normal Cardiac: Regular rate Abdomen: Non-distended abdomen Skin: No rashes cyanosis or petechiae. Extremities: No clubbing or edema  Neurologic Exam: Mental Status: Awake, alert, attentive to examiner. Oriented to self and environment. Language is fluent with intact comprehension.  Cranial Nerves: Visual acuity is grossly normal. Left homonymous hemianopia. Extra-ocular movements intact. No ptosis. Face is symmetric Motor: Tone and bulk are normal. Power is full in both arms and legs. Reflexes are symmetric, no pathologic reflexes present.  Sensory: Intact to light touch Gait: Sensory dystaxia   Labs: I have reviewed the data as listed    Component Value Date/Time   NA 136 11/28/2021 1831   K 4.4 11/28/2021 1831   CL 97 (L) 11/28/2021 1831   CO2 26 11/28/2021 1831   GLUCOSE 108 (H) 11/28/2021 1831   BUN 14 11/28/2021  1831   CREATININE 1.12 11/28/2021 1831   CREATININE 0.95 05/17/2021 1021   CALCIUM 10.3 11/28/2021 1831   PROT 7.8 11/28/2021 1831   ALBUMIN 4.7 11/28/2021 1831   AST 33 11/28/2021 1831   AST 37 05/17/2021 1021   ALT 41 11/28/2021 1831   ALT 48 (H) 05/17/2021 1021   ALKPHOS 61 11/28/2021 1831   BILITOT 1.3 (H) 11/28/2021 1831   BILITOT 1.1 05/17/2021 1021   GFRNONAA >60 11/28/2021 1831   GFRNONAA >60 05/17/2021 1021   GFRAA >60 12/05/2019 1206   Lab Results  Component Value Date   WBC 6.3 11/28/2021   NEUTROABS 4.1 11/13/2021   HGB 18.3 (H) 11/28/2021   HCT 50.4 11/28/2021   MCV 95.6 11/28/2021   PLT 194 11/28/2021   Imaging:  [place] Clinician Interpretation: I have personally reviewed the CNS images as listed.  My interpretation, in the context of the patient's clinical  presentation, is stable disease  MR BRAIN W WO CONTRAST  Result Date: 12/19/2021 CLINICAL DATA:  Provided history: Brain/CNS neoplasm, assess treatment response. Anaplastic oligodendroglioma, IDH mutant and 1p/19q codeleted. EXAM: MRI HEAD WITHOUT AND WITH CONTRAST TECHNIQUE: Multiplanar, multiecho pulse sequences of the brain and surrounding structures were obtained without and with intravenous contrast. CONTRAST:  6 mL Vueway intravenous contrast. COMPARISON:  Head CT 11/13/2021. Prior brain MRI examinations 08/19/2021 and earlier. FINDINGS: Mild intermittent motion degradation. Brain: Redemonstrated postoperative and post-treatment changes within the right parietooccipital lobes. Associated ex vacuo dilatation of the right lateral ventricle. Unchanged chronic blood products at the resection site. Unchanged T2 FLAIR hyperintense signal abnormality surrounding the resection cavity, within the callosal splenium and within the left parietal/periatrial white matter. Foci of subependymal enhancement along the occipital horn of the right lateral ventricle are similar to slightly decreased in conspicuity. No progressive signal abnormality or enhancement. No new mass effect. Chronic microhemorrhages along the right occipital and left temporal horns. Unchanged dural thickening and enhancement, and possible small CSF intensity subdural fluid collection, along the posterolateral right cerebral convexity. There is no acute infarct. Vascular: Maintained flow voids within the proximal large arterial vessels. Skull and upper cervical spine: No focal suspicious marrow lesion. Right parietooccipital cranioplasty. Sinuses/Orbits: No mass or acute finding within the imaged orbits. No significant paranasal sinus disease. IMPRESSION: Foci of subependymal enhancement along the right lateral ventricle occipital horn are similar to slightly decreased in conspicuity as compared to the prior brain MRI of 08/19/2021. Otherwise stable  examination. No new or progressive findings. Electronically Signed   By: Kellie Simmering D.O.   On: 12/19/2021 11:03    Assessment/Plan 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[place]) [C71.4]  Stephen Beard is clinically stable today.  MRI brain demonstrates stable findings.  We again recommended continued imaging surveillance only at this time, he is agreeable with this plan.   We will have social work reach out to him for counseling, job resources.  For seizures, we recommended continuing Lamictal 250/250.  We ask that Stephen Beard return to clinic in 4 months following next brain MRI, or sooner as needed.    All questions were answered. The patient knows to call the clinic with any problems, questions or concerns. No barriers to learning were detected.  I have spent a total of 30 minutes of face-to-face and non-face-to-face time, excluding clinical staff time, preparing to see patient, ordering tests and/or medications, counseling the patient, and independently interpreting results and communicating results to the patient/family/caregiver    [name], MD Medical Director  of Neuro-Oncology [facility] [place] [facility] [place] 12/20/21 10:48 AM

## 2021-12-21 ENCOUNTER — Telehealth: Payer: Self-pay

## 2021-12-21 NOTE — Telephone Encounter (Signed)
CSW attempted to contact patient per the request of Dr. Mickeal Skinner.  Left a vm with CSW contact information.

## 2021-12-22 ENCOUNTER — Other Ambulatory Visit: Payer: Self-pay

## 2021-12-23 ENCOUNTER — Telehealth: Payer: Self-pay

## 2021-12-23 NOTE — Telephone Encounter (Signed)
CSW left a vm and asked for patient to return call.

## 2021-12-28 ENCOUNTER — Other Ambulatory Visit: Payer: Self-pay | Admitting: Radiation Therapy

## 2021-12-29 ENCOUNTER — Other Ambulatory Visit: Payer: Self-pay

## 2021-12-30 ENCOUNTER — Inpatient Hospital Stay: Payer: Medicare Other

## 2021-12-30 NOTE — Progress Notes (Signed)
[place] CSW Counseling Note  Patient was referred by medical provider. Treatment type: Individual  Presenting Concerns: Patient and/or family reports the following symptoms/concerns: depression and increased alcohol use. Duration of problem: 3 years; Severity of problem: moderate   Orientation:oriented to person, place, time/date, situation, day of week, month of year, and year.   Affect: NA due to session via telephone. Risk of harm to self or others: No plan to harm self or others  Patient and/or Family's Strengths/Protective Factors: Social connections and Concrete supports in place (healthy food, safe environments, etc.)Active sense of humor  Capable of independent living  Electronics engineer  Physical Health  Religious Affiliation  Supportive family/friends      Goals Addressed: Patient will:  Reduce symptoms of: depression Increase knowledge and/or ability of: coping skills, healthy habits, and self-management skills  Increase healthy adjustment to current life circumstances, Increase motivation to adhere to plan of care, and Decrease self-medicating behaviors   Progress towards Goals: Initial   Interventions: Interventions utilized:  CBT, Solution Focused, and Supportive      Assessment: Patient currently experiencing increased alcohol consumption.  He stated he is  unemployed which increases his sense of boredom.  He feels if he could obtain access to transportation, he could find employment.  CSW and patient worked on [employer].  CSW to mail application to patient to sign, and he will send back to CSW, who will send to [employer] for more individualized transportation.  He said he was diagnosed with schizophrenia due to hearing a women talk to him all of the time.  This began when he was 26 y/o.  The voice does not tell him to do anything harmful.  He reports receiving individual counseling about a year ago.       Plan: Follow up with CSW: Thursday, 01/06/22 at 12:00pm. Behavioral recommendations: Listen to books on tape when bored, engage in a fun activity with Leretha Dykes.  Decorate for Halloween, which is an activity that  used to bring him joy.     Rodman Pickle Aljean Horiuchi, LCSW   Patient is participating in a Managed Medicaid Plan:  Yes

## 2022-01-06 ENCOUNTER — Inpatient Hospital Stay: Payer: Medicare Other

## 2022-01-06 NOTE — Progress Notes (Signed)
[place] CSW Counseling Note  Patient was referred by medical provider. Treatment type: Individual  Presenting Concerns: Patient and/or family reports the following symptoms/concerns: anxiety, depression, and alcohol use. Duration of problem: 3 years; Severity of problem: moderate   Orientation:oriented to person, place, time/date, situation, day of week, month of year, and year.   Affect: NA due to phone session. Risk of harm to self or others: No plan to harm self or others  Patient and/or Family's Strengths/Protective Factors: Social connections and Concrete supports in place (healthy food, safe environments, etc.)Active sense of humor  Engineer, drilling fund of knowledge  Supportive family/friends      Goals Addressed: Patient will:  Reduce symptoms of: anxiety and depression Increase knowledge and/or ability of: coping skills, healthy habits, self-management skills, and stress reduction  Increase healthy adjustment to current life circumstances   Progress towards Goals: Progressing   Interventions: Interventions utilized:  CBT, Solution Focused, Strength-based, and Supportive      Assessment: Patient currently experiencing anxiety regarding obtaining employment.  He reports reduced alcohol consumption since last week.  He currently receives disability.  Explored Vocational Rehabilitation Services, which patient was open to.  Patient provided additional psychosocial history.      Plan: Follow up with CSW: 01/13/22 at 12:00pm. Behavioral recommendations: Continue identifying triggers and feelings.  Consider setting boundaries with others. Referral(s): Other medical:  [employer] phone number and website.       Rodman Pickle Dorota Heinrichs, LCSW   Patient is participating in a Managed Medicaid Plan:  Yes

## 2022-01-07 ENCOUNTER — Inpatient Hospital Stay: Payer: Medicare Other

## 2022-01-07 NOTE — Progress Notes (Signed)
[place] CSW Progress Note  Clinical Education officer, museum returned patient's call from last night.  Informed patient that CSW was available during [place] hours and to call 911 if it was an emergency.  He said he understood.  Patient said he called last night because he was having an issue with his mother and stepfather.  [place] used the skills he had and was able to work through the problem.  He expressed having no other needs.   Rodman Pickle Esthela Brandner, LCSW    Patient is participating in a Managed Medicaid Plan:  Yes

## 2022-01-10 ENCOUNTER — Emergency Department (HOSPITAL_COMMUNITY): Payer: Medicare Other

## 2022-01-10 ENCOUNTER — Encounter: Payer: Self-pay | Admitting: Internal Medicine

## 2022-01-10 ENCOUNTER — Emergency Department (HOSPITAL_COMMUNITY)
Admission: EM | Admit: 2022-01-10 | Discharge: 2022-01-10 | Disposition: A | Discharge status: Home / Self Care | Payer: Medicare Other | Attending: Emergency Medicine | Admitting: Emergency Medicine

## 2022-01-10 DIAGNOSIS — Z23 Encounter for immunization: Secondary | ICD-10-CM | POA: Diagnosis not present

## 2022-01-10 DIAGNOSIS — R930 Abnormal findings on diagnostic imaging of skull and head, not elsewhere classified: Secondary | ICD-10-CM | POA: Insufficient documentation

## 2022-01-10 DIAGNOSIS — S41111A Laceration without foreign body of right upper arm, initial encounter: Secondary | ICD-10-CM | POA: Insufficient documentation

## 2022-01-10 DIAGNOSIS — F10929 Alcohol use, unspecified with intoxication, unspecified: Secondary | ICD-10-CM | POA: Diagnosis not present

## 2022-01-10 DIAGNOSIS — S3991XA Unspecified injury of abdomen, initial encounter: Secondary | ICD-10-CM | POA: Insufficient documentation

## 2022-01-10 DIAGNOSIS — S4991XA Unspecified injury of right shoulder and upper arm, initial encounter: Secondary | ICD-10-CM | POA: Diagnosis present

## 2022-01-10 DIAGNOSIS — R079 Chest pain, unspecified: Secondary | ICD-10-CM | POA: Insufficient documentation

## 2022-01-10 DIAGNOSIS — S0990XA Unspecified injury of head, initial encounter: Secondary | ICD-10-CM | POA: Diagnosis not present

## 2022-01-10 DIAGNOSIS — Y908 Blood alcohol level of 240 mg/100 ml or more: Secondary | ICD-10-CM | POA: Insufficient documentation

## 2022-01-10 DIAGNOSIS — Y92410 Unspecified street and highway as the place of occurrence of the external cause: Secondary | ICD-10-CM | POA: Diagnosis not present

## 2022-01-10 LAB — CBC
HCT: 51.5 % (ref 39.0–52.0)
Hemoglobin: 18.6 g/dL — ABNORMAL HIGH (ref 13.0–17.0)
MCH: 34.8 pg — ABNORMAL HIGH (ref 26.0–34.0)
MCHC: 36.1 g/dL — ABNORMAL HIGH (ref 30.0–36.0)
MCV: 96.4 fL (ref 80.0–100.0)
Platelets: 260 10*3/uL (ref 150–400)
RBC: 5.34 MIL/uL (ref 4.22–5.81)
RDW: 11.9 % (ref 11.5–15.5)
WBC: 7.2 10*3/uL (ref 4.0–10.5)
nRBC: 0 % (ref 0.0–0.2)

## 2022-01-10 LAB — URINALYSIS, ROUTINE W REFLEX MICROSCOPIC
Bilirubin Urine: NEGATIVE
Glucose, UA: NEGATIVE mg/dL
Hgb urine dipstick: NEGATIVE
Ketones, ur: NEGATIVE mg/dL
Leukocytes,Ua: NEGATIVE
Nitrite: NEGATIVE
Protein, ur: NEGATIVE mg/dL
Specific Gravity, Urine: 1.015 (ref 1.005–1.030)
pH: 6 (ref 5.0–8.0)

## 2022-01-10 LAB — COMPREHENSIVE METABOLIC PANEL
ALT: 33 U/L (ref 0–44)
AST: 28 U/L (ref 15–41)
Albumin: 4.7 g/dL (ref 3.5–5.0)
Alkaline Phosphatase: 60 U/L (ref 38–126)
Anion gap: 16 — ABNORMAL HIGH (ref 5–15)
BUN: 6 mg/dL (ref 6–20)
CO2: 22 mmol/L (ref 22–32)
Calcium: 9.2 mg/dL (ref 8.9–10.3)
Chloride: 98 mmol/L (ref 98–111)
Creatinine, Ser: 0.85 mg/dL (ref 0.61–1.24)
GFR, Estimated: >60 mL/min (ref >60)
Glucose, Bld: 105 mg/dL — ABNORMAL HIGH (ref 70–99)
Potassium: 3.8 mmol/L (ref 3.5–5.1)
Sodium: 136 mmol/L (ref 135–145)
Total Bilirubin: 0.6 mg/dL (ref 0.3–1.2)
Total Protein: 7.5 g/dL (ref 6.5–8.1)

## 2022-01-10 LAB — I-STAT CHEM 8, ED
BUN: 5 mg/dL — ABNORMAL LOW (ref 6–20)
Calcium, Ion: 1.04 mmol/L — ABNORMAL LOW (ref 1.15–1.40)
Chloride: 98 mmol/L (ref 98–111)
Creatinine, Ser: 1.1 mg/dL (ref 0.61–1.24)
Glucose, Bld: 107 mg/dL — ABNORMAL HIGH (ref 70–99)
HCT: 51 % (ref 39.0–52.0)
Hemoglobin: 17.3 g/dL — ABNORMAL HIGH (ref 13.0–17.0)
Potassium: 3.8 mmol/L (ref 3.5–5.1)
Sodium: 138 mmol/L (ref 135–145)
TCO2: 25 mmol/L (ref 22–32)

## 2022-01-10 LAB — SAMPLE TO BLOOD BANK

## 2022-01-10 LAB — LACTIC ACID, PLASMA: Lactic Acid, Venous: 2.5 mmol/L (ref 0.5–1.9)

## 2022-01-10 LAB — PROTIME-INR
INR: 1 (ref 0.8–1.2)
Prothrombin Time: 12.7 seconds (ref 11.4–15.2)

## 2022-01-10 LAB — ETHANOL: Alcohol, Ethyl (B): 313 mg/dL (ref <10)

## 2022-01-10 MED ORDER — LIDOCAINE HCL (PF) 1 % IJ SOLN
5.0000 mL | Freq: Once | INTRAMUSCULAR | Status: AC
  Administered 2022-01-10: 5 mL via INTRADERMAL
  Filled 2022-01-10: qty 5

## 2022-01-10 MED ORDER — TETANUS-DIPHTH-ACELL PERTUSSIS 5-2.5-18.5 LF-MCG/0.5 IM SUSY
0.5000 mL | PREFILLED_SYRINGE | Freq: Once | INTRAMUSCULAR | Status: AC
  Administered 2022-01-10: 0.5 mL via INTRAMUSCULAR
  Filled 2022-01-10: qty 0.5

## 2022-01-10 MED ORDER — IOHEXOL 350 MG/ML SOLN
75.0000 mL | Freq: Once | INTRAVENOUS | Status: AC | PRN
  Administered 2022-01-10: 75 mL via INTRAVENOUS

## 2022-01-10 NOTE — ED Notes (Signed)
X-ray at bedside

## 2022-01-10 NOTE — Progress Notes (Signed)
Orthopedic Tech Progress Note Patient Details:  Stephen Beard [birth year] [id number]  Level 2 trauma   Patient ID: Stephen Beard, male   DOB: 1995/09/25, 26 y.o.   MRN: [id number]  Stephen Beard 01/10/2022, 7:02 PM

## 2022-01-10 NOTE — Progress Notes (Signed)
Chaplain responded to Level 2.  Patient not in room at this time.  Call if chaplain needed. Rev. Tamsen Snider Pager [phone]

## 2022-01-10 NOTE — ED Notes (Signed)
Pt back to room from CT
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
## 2022-01-10 NOTE — ED Notes (Signed)
Pt arrived in CT 

## 2022-01-12 ENCOUNTER — Encounter: Payer: Self-pay | Admitting: Internal Medicine

## 2022-01-13 ENCOUNTER — Telehealth: Payer: Self-pay

## 2022-01-13 ENCOUNTER — Inpatient Hospital Stay: Payer: Medicare Other

## 2022-01-13 NOTE — Telephone Encounter (Signed)
CSW attempted to contact patient for the 12pm counseling session today.  Patient did not answer.  CSW left a vm.

## 2022-02-11 ENCOUNTER — Telehealth: Payer: Self-pay

## 2022-02-11 NOTE — Telephone Encounter (Signed)
CSW attempted to contact patient to assess psychosocial needs.  Left vm. 

## 2022-03-18 ENCOUNTER — Inpatient Hospital Stay: Payer: Medicare Other | Attending: Internal Medicine
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
## 2022-04-08 ENCOUNTER — Inpatient Hospital Stay: Payer: Medicare Other

## 2022-04-08 NOTE — Progress Notes (Signed)
[facility] CSW Progress Note  Clinical Education officer, museum contacted patient by phone to assess needs.  Patient did not respond to the scheduled phone session on 1/12 at 12pm.  Patient stated he had good news but was not in a private area to speak.  It was agreed that CSW would call patient at 1pm on Monday 1/29.    Rodman Pickle Jimy Gates, LCSW    Patient is participating in a Managed Medicaid Plan:  Yes

## 2022-04-11 ENCOUNTER — Telehealth: Payer: Self-pay

## 2022-04-11 NOTE — Telephone Encounter (Signed)
CSW attempted to contact patient for our 1pm counseling session.  Left a vm.

## 2022-04-15 ENCOUNTER — Telehealth: Payer: Self-pay

## 2022-04-15 NOTE — Telephone Encounter (Signed)
CSW returned patient's call.  Left vm. 

## 2022-04-19 ENCOUNTER — Ambulatory Visit (HOSPITAL_COMMUNITY)
Admission: RE | Admit: 2022-04-19 | Discharge: 2022-04-19 | Disposition: A | Discharge status: Home / Self Care | Payer: Medicare Other | Source: Ambulatory Visit | Attending: Internal Medicine | Admitting: Internal Medicine

## 2022-04-19 DIAGNOSIS — C714 Malignant neoplasm of occipital lobe: Secondary | ICD-10-CM | POA: Diagnosis present

## 2022-04-19 MED ORDER — GADOBUTROL 1 MMOL/ML IV SOLN
6.0000 mL | Freq: Once | INTRAVENOUS | Status: AC | PRN
  Administered 2022-04-19: 6 mL via INTRAVENOUS

## 2022-04-25 ENCOUNTER — Inpatient Hospital Stay: Payer: Medicare Other

## 2022-04-25 ENCOUNTER — Other Ambulatory Visit: Payer: Self-pay

## 2022-04-25 ENCOUNTER — Inpatient Hospital Stay: Payer: Medicare Other | Attending: Internal Medicine | Admitting: Internal Medicine

## 2022-04-25 VITALS — BP 130/84 | HR 72 | Temp 98.1°F | Resp 14 | Wt 139.5 lb

## 2022-04-25 DIAGNOSIS — R569 Unspecified convulsions: Secondary | ICD-10-CM

## 2022-04-25 DIAGNOSIS — F17210 Nicotine dependence, cigarettes, uncomplicated: Secondary | ICD-10-CM | POA: Insufficient documentation

## 2022-04-25 DIAGNOSIS — C719 Malignant neoplasm of brain, unspecified: Secondary | ICD-10-CM

## 2022-04-25 DIAGNOSIS — C714 Malignant neoplasm of occipital lobe: Secondary | ICD-10-CM | POA: Diagnosis present

## 2022-04-25 DIAGNOSIS — Z79899 Other long term (current) drug therapy: Secondary | ICD-10-CM | POA: Insufficient documentation

## 2022-04-25 NOTE — Progress Notes (Signed)
[facility] [place], [place] [address] [phone]   Interval Evaluation  Date of Service: 04/25/22 Patient Name: Stephen Beard Patient MRN: [id number] Patient DOB: 08/16/1995 Provider: [name], MD  Identifying Statement:  Stephen Beard is a 27 y.o. 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 Zada Finders. Followed by hematoma evacuation on 07/03/19.   08/05/2019 - 09/17/2019 Radiation Therapy   IMRT with concurrent Temodar 26m/m2 daily   10/15/2019 -  Chemotherapy    Patient is on Treatment Plan: BRAIN ANAPLASTIC GLIOMA GRADE III TEMOZOLOMIDE POST XRT Q28D        Biomarkers:  MGMT Unknown.  IDH 1/2 Mutated.  EGFR Unknown  1p/19q co-deleted   Interval History:  Stephen Beard presents today for follow up after recent MRI brain.  He does continue to have sporadic seizure events, characterized by left arm numbness and transient weakness, resolves after 30 minutes.  Frequency is 1-2x per week.  He has cut out alcohol for the most part.  Otherwise denies new or progressive deficits.  Occassional tension headaches only.    H+P (07/19/19) Patient presented to medical attention with several months of progressive headaches and left sided visual impairment.  This was accompanied by episodes of deja-vu +paroxysmal burning smells which would occur almost daily over that time.  CNS imaging demonstrated large parieto-occipital mass with herniation syndrome.  Mass was resected/debulked, and post-operative course was complicated by intracranial hematoma and also respiratory failure.  At present, he is walking and talking, carries left sided visual impairment.  Currently on antibiotics for wound infection per Dr. OZada Finders  Not requiring steroids.  Currently on lamictal 565m daily.  Medications: Current Outpatient Medications on File Prior to Visit  Medication Sig Dispense Refill   acetaminophen (TYLENOL) 500 MG tablet Take 500 mg by mouth every 6 (six) hours as needed for moderate pain.     ibuprofen (ADVIL) 200 MG tablet Take 200 mg by mouth every 6 (six) hours as needed for headache (pain).     lamoTRIgine (LAMICTAL) 100 MG tablet Take 1 tablet (10061mby mouth two times daily. Take along with one 150m57mblet.  (250mg82mce daily total) 60 tablet 3   lamoTRIgine (LAMICTAL) 150 MG tablet Take 1 tablet (150mg)23mmouth two times daily. Take with 100mg t42mt.  (250mg tw26mdaily total) 60 tablet 3   traZODone (DESYREL) 100 MG tablet TAKE 1 TABLET BY MOUTH AT BEDTIME AS NEEDED FOR SLEEP (Patient taking differently: Take 100 mg by mouth at bedtime as needed for sleep.) 30 tablet 0   No current facility-administered medications on file prior to visit.    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    Headache    Psychotic disorder (HCC)    Seizures (HCC)    [place] Surgical History:  Past Surgical History:  Procedure Laterality Date   APPLICATION OF CRANIAL NAVIGATION N/A 07/02/2019   Procedure: APPLICATION OF CRANIAL NAVIGATION;  Surgeon: OstergarJudith Partocation: [facility] OR;  [place]: Neurosurgery;  Laterality: N/A;   CRANIOTOMY N/A 07/03/2019   Procedure: CRANIOTOMY HEMATOMA EVACUATION SUBDURAL;  Surgeon: OstergarJudith Partocation: [facility] OR;  [place]: Neurosurgery;  Laterality:  N/A;   CRANIOTOMY Right 07/03/2019   Procedure: CRANIOTOMY HEMATOMA EVACUATION SUBDURAL;  Surgeon: Judith Part, MD;  Location: [facility];  Service: Neurosurgery;  Laterality: Right;   CRANIOTOMY Right 07/02/2019   Procedure: CRANIOTOMY TUMOR EXCISION with Lucky Rathke;  Surgeon: Judith Part, MD;  Location: [facility];  Service: Neurosurgery;   Laterality: Right;  posterior   Social History:  Social History   Socioeconomic History   Marital status: Single    Spouse name: Not on file   Number of children: Not on file   Years of education: Not on file   Highest education level: Not on file  Occupational History   Not on file  Tobacco Use   Smoking status: Every Day    Packs/day: 1.00    Years: 12.00    Total pack years: 12.00    Types: Cigarettes   Smokeless tobacco: Current    Types: Chew  Vaping Use   Vaping Use: Never used  Substance and Sexual Activity   Alcohol use: Yes    Comment: last drink: "3 nights ago" when he had "1/4th of a bottle," has been drinking daily for several weeks from a "few beers to a bottle of liquor"   Drug use: Yes    Types: Cocaine    Comment: last use "couple of weeks ago" when he had "2 lines,"  frequency "once in a blue moon"   Sexual activity: Not Currently  Other Topics Concern   Not on file  Social History Narrative   Not on file   Social Determinants of Health   Financial Resource Strain: Medium Risk (01/07/2022)   Overall Financial Resource Strain (CARDIA)    Difficulty of Paying Living Expenses: Somewhat hard  Food Insecurity: No Food Insecurity (01/07/2022)   Hunger Vital Sign    Worried About Running Out of Food in the Last Year: Never true    Ran Out of Food in the Last Year: Never true  Transportation Needs: Unmet Transportation Needs (01/07/2022)   PRAPARE - Hydrologist (Medical): No    Lack of Transportation (Non-Medical): Yes  Physical Activity: Not on file  Stress: Not on file  Social Connections: Moderately Integrated (01/07/2022)   Social Connection and Isolation Panel [NHANES]    Frequency of Communication with Friends and Family: More than three times a week    Frequency of Social Gatherings with Friends and Family: More than three times a week    Attends Religious Services: More than 4 times per year    Active Member of [employer]  or Organizations: Yes    Attends Archivist Meetings: More than 4 times per year    Marital Status: Never married  Intimate Partner Violence: Not At Risk (01/07/2022)   Humiliation, Afraid, Rape, and Kick questionnaire    Fear of Current or Ex-Partner: No    Emotionally Abused: No    Physically Abused: No    Sexually Abused: No   Family History: No family history on file.  Review of Systems: Constitutional: Doesn't report fevers, chills or abnormal weight loss Eyes: Doesn't report blurriness of vision Ears, nose, mouth, throat, and face: Doesn't report sore throat Respiratory: Doesn't report cough, dyspnea or wheezes Cardiovascular: Doesn't report palpitation, chest discomfort  Gastrointestinal:  Doesn't report nausea, constipation, diarrhea GU: Doesn't report incontinence Skin: Doesn't report skin rashes Neurological: Per HPI Musculoskeletal: Doesn't report joint pain Behavioral/Psych: Doesn't report anxiety  Physical Exam: Vitals:   04/25/22 1152  BP: 130/84  Pulse: 72  Resp: 14  Temp: 98.1 F (36.7 C)  SpO2: 99%   KPS: 80. General: Alert, cooperative, pleasant, in no acute distress Head: Normal EENT: No conjunctival injection or scleral icterus.  Lungs: Resp effort normal Cardiac: Regular rate Abdomen: Non-distended abdomen Skin: No rashes cyanosis or petechiae. Extremities: No clubbing or edema  Neurologic Exam: Mental Status: Awake, alert, attentive to examiner. Oriented to self and environment. Language is fluent with intact comprehension.  Cranial Nerves: Visual acuity is grossly normal. Left homonymous hemianopia. Extra-ocular movements intact. No ptosis. Face is symmetric Motor: Tone and bulk are normal. Power is full in both arms and legs. Reflexes are symmetric, no pathologic reflexes present.  Sensory: Intact to light touch Gait: Sensory dystaxia   Labs: I have reviewed the data as listed    Component Value Date/Time   NA 138 01/10/2022  1851   K 3.8 01/10/2022 1851   CL 98 01/10/2022 1851   CO2 22 01/10/2022 1836   GLUCOSE 107 (H) 01/10/2022 1851   BUN 5 (L) 01/10/2022 1851   CREATININE 1.10 01/10/2022 1851   CREATININE 0.95 05/17/2021 1021   CALCIUM 9.2 01/10/2022 1836   PROT 7.5 01/10/2022 1836   ALBUMIN 4.7 01/10/2022 1836   AST 28 01/10/2022 1836   AST 37 05/17/2021 1021   ALT 33 01/10/2022 1836   ALT 48 (H) 05/17/2021 1021   ALKPHOS 60 01/10/2022 1836   BILITOT 0.6 01/10/2022 1836   BILITOT 1.1 05/17/2021 1021   GFRNONAA >60 01/10/2022 1836   GFRNONAA >60 05/17/2021 1021   GFRAA >60 12/05/2019 1206   Lab Results  Component Value Date   WBC 7.2 01/10/2022   NEUTROABS 4.1 11/13/2021   HGB 17.3 (H) 01/10/2022   HCT 51.0 01/10/2022   MCV 96.4 01/10/2022   PLT 260 01/10/2022   Imaging:  [place] Clinician Interpretation: I have personally reviewed the CNS images as listed.  My interpretation, in the context of the patient's clinical presentation, is stable disease  MR BRAIN W WO CONTRAST  Result Date: 04/21/2022 CLINICAL DATA:  27 year old male with history of right occipital anaplastic oligodendroglioma status post resection in April of 2021, subsequent radiation. Restaging. EXAM: MRI HEAD WITHOUT AND WITH CONTRAST TECHNIQUE: Multiplanar, multiecho pulse sequences of the brain and surrounding structures were obtained without and with intravenous contrast. CONTRAST:  67m GADAVIST GADOBUTROL 1 MMOL/ML IV SOLN COMPARISON:  Brain MRI 12/17/2021 and earlier. FINDINGS: Brain: Previous right side, posterior craniotomy. Widespread mild but conspicuous pachymeningeal thickening and enhancement in the right hemisphere appears stable from 05/14/2021 (series 17, image 112 today). Posterior right hemisphere resection cavity and cerebral volume loss appears stable and size and configuration since that time. Regressed and largely resolved enhancement along the anterior resection cavity which was partially contiguous with the  adjacent choroid plexus 1 year ago (largely resolved enhancement now on series 17, image 92 today). Regional T2 and FLAIR hyperintensity likewise appears stable to mildly regressed from 1 year ago. Note that posterior right parietal gyri have decreased in size on series 12, image 35 since 05/14/2021. Ex vacuo enlargement of the right temporal and occipital horns is stable. No suspicious DWI changes, with unchanged subtle increased trace diffusion adjacent to the residual posterior body of the corpus callosum on series 5, image 32, not obviously restricted. And abundant chronic hemosiderin in this region on SWI, such that susceptibility artifact is likely. Superimposed stable cerebral volume. No restricted diffusion to suggest acute infarction. No midline shift or acute intracranial hemorrhage. Cervicomedullary  junction and pituitary are within normal limits. No new signal abnormality or abnormal enhancement. Vascular: Major intracranial vascular flow voids are stable. Following contrast major dural venous sinuses are enhancing and appear to be patent. Skull and upper cervical spine: Stable postoperative changes to the skull. Normal background bone marrow signal. Negative visible cervical spine and spinal cord. Sinuses/Orbits: Stable, negative. Other: Mastoids remain clear. Visible internal auditory structures appear normal. Stable scalp soft tissues. IMPRESSION: 1. Satisfactory post treatment appearance of the brain, improved compared to 05/14/2021. 2. No new intracranial abnormality. Electronically Signed   By: Genevie Ann M.D.   On: 04/21/2022 08:59    Assessment/Plan Anaplastic oligodendroglioma, 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[place]) [C71.9]  Stephen Beard is clinically stable today.  MRI brain demonstrates stable findings.  We again recommended continued imaging surveillance only at this time, he is agreeable with this plan.   We will have social work reach out to him for counseling, job  resources.  For seizures, we recommended continuing Lamictal 250/250.  If left arm focal events worsen, we will consider adding Vimpat to his AED regimen.    We ask that Stephen Beard return to clinic in 6 months following next brain MRI, or sooner as needed.    All questions were answered. The patient knows to call the clinic with any problems, questions or concerns. No barriers to learning were detected.  I have spent a total of 40 minutes of face-to-face and non-face-to-face time, excluding clinical staff time, preparing to see patient, ordering tests and/or medications, counseling the patient, and independently interpreting results and communicating results to the patient/family/caregiver    [name], MD Medical Director of Neuro-Oncology [facility] [place] 04/25/22 12:19 PM

## 2022-04-26 ENCOUNTER — Other Ambulatory Visit: Payer: Self-pay

## 2022-04-26 ENCOUNTER — Other Ambulatory Visit: Payer: Self-pay | Admitting: Radiation Therapy

## 2022-04-27 ENCOUNTER — Other Ambulatory Visit: Payer: Self-pay

## 2022-05-02 ENCOUNTER — Telehealth: Payer: Self-pay | Admitting: *Deleted

## 2022-05-02 NOTE — Telephone Encounter (Signed)
Patient requested fax number of office. He is registering for [employer] (Specialized bus transportation through [employer]) and they need to fax a form for Dr. Mickeal Skinner to complete. Gave him desk fax #

## 2022-05-31 ENCOUNTER — Encounter (HOSPITAL_COMMUNITY): Payer: Self-pay | Admitting: Emergency Medicine

## 2022-05-31 ENCOUNTER — Emergency Department (HOSPITAL_COMMUNITY)
Admission: EM | Admit: 2022-05-31 | Discharge: 2022-06-01 | Disposition: A | Discharge status: Home / Self Care | Payer: Medicare Other | Attending: Emergency Medicine | Admitting: Emergency Medicine

## 2022-05-31 ENCOUNTER — Other Ambulatory Visit: Payer: Self-pay

## 2022-05-31 ENCOUNTER — Emergency Department (HOSPITAL_COMMUNITY): Payer: Medicare Other

## 2022-05-31 DIAGNOSIS — Z79899 Other long term (current) drug therapy: Secondary | ICD-10-CM | POA: Insufficient documentation

## 2022-05-31 DIAGNOSIS — G40909 Epilepsy, unspecified, not intractable, without status epilepticus: Secondary | ICD-10-CM | POA: Insufficient documentation

## 2022-05-31 DIAGNOSIS — R410 Disorientation, unspecified: Secondary | ICD-10-CM | POA: Insufficient documentation

## 2022-05-31 DIAGNOSIS — R531 Weakness: Secondary | ICD-10-CM | POA: Diagnosis not present

## 2022-05-31 DIAGNOSIS — R569 Unspecified convulsions: Secondary | ICD-10-CM

## 2022-05-31 LAB — CBC WITH DIFFERENTIAL/PLATELET
Abs Immature Granulocytes: 0.02 10*3/uL (ref 0.00–0.07)
Basophils Absolute: 0 10*3/uL (ref 0.0–0.1)
Basophils Relative: 0 %
Eosinophils Absolute: 0 10*3/uL (ref 0.0–0.5)
Eosinophils Relative: 0 %
HCT: 48.3 % (ref 39.0–52.0)
Hemoglobin: 17.2 g/dL — ABNORMAL HIGH (ref 13.0–17.0)
Immature Granulocytes: 0 %
Lymphocytes Relative: 12 %
Lymphs Abs: 0.6 10*3/uL — ABNORMAL LOW (ref 0.7–4.0)
MCH: 34.1 pg — ABNORMAL HIGH (ref 26.0–34.0)
MCHC: 35.6 g/dL (ref 30.0–36.0)
MCV: 95.6 fL (ref 80.0–100.0)
Monocytes Absolute: 0.5 10*3/uL (ref 0.1–1.0)
Monocytes Relative: 10 %
Neutro Abs: 3.9 10*3/uL (ref 1.7–7.7)
Neutrophils Relative %: 78 %
Platelets: 170 10*3/uL (ref 150–400)
RBC: 5.05 MIL/uL (ref 4.22–5.81)
RDW: 11.8 % (ref 11.5–15.5)
WBC: 5 10*3/uL (ref 4.0–10.5)
nRBC: 0 % (ref 0.0–0.2)

## 2022-05-31 LAB — CBG MONITORING, ED: Glucose-Capillary: 112 mg/dL — ABNORMAL HIGH (ref 70–99)

## 2022-05-31 LAB — I-STAT ARTERIAL BLOOD GAS, ED
Acid-Base Excess: 1 mmol/L (ref 0.0–2.0)
Bicarbonate: 25.1 mmol/L (ref 20.0–28.0)
Calcium, Ion: 1.19 mmol/L (ref 1.15–1.40)
HCT: 44 % (ref 39.0–52.0)
Hemoglobin: 15 g/dL (ref 13.0–17.0)
O2 Saturation: 100 %
Patient temperature: 98.6
Potassium: 4.2 mmol/L (ref 3.5–5.1)
Sodium: 134 mmol/L — ABNORMAL LOW (ref 135–145)
TCO2: 26 mmol/L (ref 22–32)
pCO2 arterial: 39.1 mmHg (ref 32–48)
pH, Arterial: 7.415 (ref 7.35–7.45)
pO2, Arterial: 425 mmHg — ABNORMAL HIGH (ref 83–108)

## 2022-05-31 LAB — BASIC METABOLIC PANEL
Anion gap: 17 — ABNORMAL HIGH (ref 5–15)
BUN: 10 mg/dL (ref 6–20)
CO2: 20 mmol/L — ABNORMAL LOW (ref 22–32)
Calcium: 9 mg/dL (ref 8.9–10.3)
Chloride: 99 mmol/L (ref 98–111)
Creatinine, Ser: 1.05 mg/dL (ref 0.61–1.24)
GFR, Estimated: >60 mL/min (ref >60)
Glucose, Bld: 116 mg/dL — ABNORMAL HIGH (ref 70–99)
Potassium: 3.8 mmol/L (ref 3.5–5.1)
Sodium: 136 mmol/L (ref 135–145)

## 2022-05-31 LAB — ETHANOL: Alcohol, Ethyl (B): <10 mg/dL (ref <10)

## 2022-05-31 MED ORDER — SODIUM CHLORIDE 0.9 % IV SOLN: 500.0000 mg | Freq: Once | INTRAVENOUS | Status: DC

## 2022-05-31 MED ORDER — LEVETIRACETAM IN NACL 1000 MG/100ML IV SOLN
1000.0000 mg | INTRAVENOUS | Status: AC
  Administered 2022-05-31 (×2): 1000 mg via INTRAVENOUS
  Filled 2022-05-31 (×2): qty 100

## 2022-05-31 MED ORDER — METHYLPREDNISOLONE SODIUM SUCC 125 MG IJ SOLR: 125.0000 mg | Freq: Once | INTRAMUSCULAR | Status: DC

## 2022-05-31 MED ORDER — ALBUTEROL (5 MG/ML) CONTINUOUS INHALATION SOLN: 10.0000 mg/h | INHALATION_SOLUTION | RESPIRATORY_TRACT | Status: DC

## 2022-05-31 MED ORDER — SODIUM CHLORIDE 0.9 % IV SOLN: 1.0000 g | Freq: Once | INTRAVENOUS | Status: DC

## 2022-05-31 MED ORDER — LORAZEPAM 2 MG/ML IJ SOLN
1.0000 mg | Freq: Once | INTRAMUSCULAR | Status: AC
  Administered 2022-05-31: 1 mg via INTRAVENOUS
  Filled 2022-05-31: qty 1

## 2022-05-31 NOTE — ED Notes (Signed)
Patient transported to CT 

## 2022-05-31 NOTE — ED Provider Notes (Signed)
[place] Provider Note   CSN: [id number] Arrival date & time: 05/31/22  2217     History {Add pertinent medical, surgical, social history, OB history to HPI:1} Chief Complaint  Patient presents with   Seizures    Stephen Beard is a 27 y.o. male.   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  History is provided by EMS, patient had 2 witnessed seizures lasting 2 minutes each without a postictal in between with family.  He had 1 seizure in route with EMS, given 5 Versed.  Family endorses patient drank alcohol yesterday and is not supposed to.  He is reportedly been taking his Lamictal without any missed doses.  Patient's mother is at bedside providing collateral information.  States patient drank alcohol yesterday, nothing today and he did take his nighttime dose of Lamictal.  Had 1 seizure that lasted about 2 minutes, he did go back to baseline but then had another seizure that lasted about 7 minutes until EMS arrived.  Last seizure was 3 and half months ago when he drank alcohol in [place].    Home Medications Prior to Admission medications   Medication Sig Start Date End Date Taking? Authorizing Provider  acetaminophen (TYLENOL) 500 MG tablet Take 500 mg by mouth every 6 (six) hours as needed for moderate pain.    [provider]  ibuprofen (ADVIL) 200 MG tablet Take 200 mg by mouth every 6 (six) hours as needed for headache (pain).    [provider]  lamoTRIgine (LAMICTAL) 100 MG tablet Take 1 tablet (100mg ) by mouth two times daily. Take along with one 150mg  tablet.  (250mg  twice daily total) 11/18/21   Vaslow, Acey Lav, MD  lamoTRIgine (LAMICTAL) 150 MG tablet Take 1 tablet (150mg ) by mouth two times daily. Take with 100mg  tablet.  (250mg  twice daily total) 11/18/21   Vaslow,  Acey Lav, MD  traZODone (DESYREL) 100 MG tablet TAKE 1 TABLET BY MOUTH AT BEDTIME AS NEEDED FOR SLEEP Patient taking differently: Take 100 mg by mouth at bedtime as needed for sleep. 11/01/21   Vaslow, Acey Lav, MD      Allergies    Patient has no known allergies.    Review of Systems   Review of Systems  Neurological:  Positive for seizures.    Physical Exam Updated Vital Signs BP 113/77   Pulse 73   Temp 98.6 F (37 C) (Axillary)   Resp 19   Ht 5\' 7"  (1.702 m)   Wt 64 kg   SpO2 100%   BMI 22.10 kg/m  Physical Exam Vitals and nursing note reviewed. Exam conducted with a chaperone present.  Constitutional:      Appearance: Normal appearance.  HENT:     Head: Normocephalic and atraumatic.  Eyes:     General: No scleral icterus.       Right eye: No discharge.        Left eye: No discharge.     Extraocular Movements: Extraocular movements intact.     Pupils: Pupils are equal, round, and reactive to light.     Comments: Left slightly dilated compared to right  Cardiovascular:     Rate and Rhythm: Normal rate and regular rhythm.     Pulses: Normal pulses.     Heart sounds: Normal heart sounds.     No friction  rub. No gallop.  Pulmonary:     Effort: Pulmonary effort is normal. No respiratory distress.     Breath sounds: Normal breath sounds.  Abdominal:     General: Abdomen is flat. Bowel sounds are normal. There is no distension.     Palpations: Abdomen is soft.     Tenderness: There is no abdominal tenderness.  Skin:    General: Skin is warm and dry.     Coloration: Skin is not jaundiced.  Neurological:     Mental Status: He is alert. He is disoriented.     Coordination: Coordination normal.     Comments: Somnolent but arousable, answer some questions.  Left upper extremity weak compared to right     ED Results / Procedures / Treatments   Labs (all labs ordered are listed, but only abnormal results are displayed) Labs Reviewed  CBC WITH  DIFFERENTIAL/PLATELET - Abnormal; Notable for the following components:      Result Value   Hemoglobin 17.2 (*)    MCH 34.1 (*)    Lymphs Abs 0.6 (*)    All other components within normal limits  LAMOTRIGINE LEVEL  BASIC METABOLIC PANEL  ETHANOL  BLOOD GAS, ARTERIAL  CBG MONITORING, ED    EKG EKG Interpretation  Date/Time:  Tuesday May 31 2022 22:37:24 EDT Ventricular Rate:  82 PR Interval:  130 QRS Duration: 109 QT Interval:  362 QTC Calculation: 423 R Axis:   45 Text Interpretation: Sinus rhythm RSR' in V1 or V2, right VCD or RVH Confirmed by Noemi Chapel [phone]) on 05/31/2022 10:40:16 PM  Radiology No results found.  Procedures Procedures  {Document cardiac monitor, telemetry assessment procedure when appropriate:1}  Medications Ordered in ED Medications  levETIRAcetam (KEPPRA) IVPB 1000 mg/100 mL premix (0 mg Intravenous Stopped 05/31/22 2300)  methylPREDNISolone sodium succinate (SOLU-MEDROL) 125 mg/2 mL injection 125 mg (has no administration in time range)  LORazepam (ATIVAN) injection 1 mg (1 mg Intravenous Given 05/31/22 2248)    ED Course/ Medical Decision Making/ A&P Clinical Course as of 05/31/22 2313  Tue May 31, 2022  2233 I discussed this case with Dr. Saralyn Pilar on the neurology service, who recommends loading with 2 g of Keppra, CT head, lamotrigine level and possibly adding 500 milligrams Keppra twice daily to out patient meds [HS]    Clinical Course User Index [HS] Sherrill Raring, PA-C   {   Click here for ABCD2, HEART and other calculatorsREFRESH Note before signing :1}                          Medical Decision Making Amount and/or Complexity of Data Reviewed Labs: ordered. Radiology: ordered.  Risk Prescription drug management.    Patient with medical history of Bifrontal oligodendroglioma  status post right craniotomy, seizure disorder on Lamictal, alcohol use disorder presents to the emergency department due to seizures.   I reviewed  external medical notes including visits with oncologist who seems to be managing his antiepileptics, he is on Lamictal currently.  {Document critical care time when appropriate:1} {Document review of labs and clinical decision tools ie heart score, Chads2Vasc2 etc:1}  {Document your independent review of radiology images, and any outside records:1} {Document your discussion with family members, caretakers, and with consultants:1} {Document social determinants of health affecting pt's care:1} {Document your decision making why or why not admission, treatments were needed:1} Final Clinical Impression(s) / ED Diagnoses Final diagnoses:  None    Rx / DC Orders ED  Discharge Orders     None

## 2022-05-31 NOTE — ED Triage Notes (Signed)
Pt BIB EMS from home with c/o multiple seizures today without going back baseline. Pt had one witnessed seizure lasting 2 minutes with ems. Pt's O2 would not increase above 90% on 15l NRB.  16 LAC  5mg  IM versed given

## 2022-05-31 NOTE — ED Provider Notes (Signed)
This patient is a two 27-year-old male, he has a history of an anaplastic oligodendroglioma, he had a surgical resection of this in April 2021, since that time has had headaches for which she takes lamotrigine also noted is trazodone  Reportedly had 2 separate seizures today back-to-back without return to baseline, still somnolent and difficult to arouse  Patient will be placed on a cardiac monitor, CT scan of the brain, consultation with neurology, milligram of Ativan, if he has another seizure we will add Keppra and more Ativan.  Maintaining his airway at this time, able to answer questions, very quiet, has some weakness in his left arm  No other focal neurodeficits.  Patient critically ill with what appears to be status epilepticus  Final diagnoses:  None

## 2022-06-01 MED ORDER — LEVETIRACETAM 500 MG PO TABS: 500.0000 mg | ORAL_TABLET | Freq: Two times a day (BID) | ORAL | 0 refills | Status: DC

## 2022-06-01 NOTE — ED Notes (Signed)
Pt returned from CT. RN removed NRB. Oxygen saturations maintaining >92% on RA.

## 2022-06-01 NOTE — Discharge Instructions (Signed)
You are seen today in the emergency department due to seizures.  Continue taking the Lamictal, call your oncologist tomorrow regarding addition of the secondary antiepileptic.  Our neurology team would recommend taking Keppra 500 mg twice daily in addition to the Lamictal.  I have sent the prescription to your pharmacy to trial if approved by your oncology team.  Return to the ED if you have more seizures, new or concerning symptoms.

## 2022-06-02 LAB — LAMOTRIGINE LEVEL: Lamotrigine Lvl: 3.8 ug/mL (ref 2.0–20.0)

## 2022-07-19 ENCOUNTER — Other Ambulatory Visit: Payer: Self-pay | Admitting: Internal Medicine

## 2022-08-02 ENCOUNTER — Other Ambulatory Visit: Payer: Self-pay | Admitting: Internal Medicine

## 2022-08-29 ENCOUNTER — Other Ambulatory Visit: Payer: Self-pay | Admitting: Internal Medicine

## 2022-10-06 ENCOUNTER — Other Ambulatory Visit: Payer: Self-pay | Admitting: Internal Medicine

## 2022-10-06 DIAGNOSIS — R569 Unspecified convulsions: Secondary | ICD-10-CM

## 2022-10-12 ENCOUNTER — Telehealth: Payer: Self-pay | Admitting: Internal Medicine

## 2022-10-12 NOTE — Telephone Encounter (Signed)
Called patient regarding August appointments, left a voicemail.

## 2022-10-19 ENCOUNTER — Encounter: Payer: Self-pay | Admitting: Internal Medicine

## 2022-10-20 ENCOUNTER — Ambulatory Visit (HOSPITAL_COMMUNITY): Payer: Medicare Other

## 2022-10-21 ENCOUNTER — Other Ambulatory Visit: Payer: Self-pay

## 2022-10-22 ENCOUNTER — Other Ambulatory Visit (HOSPITAL_COMMUNITY): Payer: Medicare Other

## 2022-10-24 ENCOUNTER — Inpatient Hospital Stay: Payer: Medicare Other

## 2022-10-24 ENCOUNTER — Inpatient Hospital Stay: Payer: Medicare Other | Admitting: Internal Medicine

## 2022-10-24 ENCOUNTER — Telehealth: Payer: Self-pay | Admitting: Internal Medicine

## 2022-10-25 ENCOUNTER — Other Ambulatory Visit: Payer: Self-pay

## 2022-10-31 ENCOUNTER — Emergency Department (HOSPITAL_COMMUNITY): Payer: Medicare Other

## 2022-10-31 ENCOUNTER — Inpatient Hospital Stay (HOSPITAL_COMMUNITY)
Admission: EM | Admit: 2022-10-31 | Discharge: 2022-11-07 | DRG: 100 | Disposition: A | Discharge status: Rehabilitation Facility | Payer: Medicare Other | Attending: Internal Medicine | Admitting: Internal Medicine

## 2022-10-31 ENCOUNTER — Encounter (HOSPITAL_COMMUNITY): Payer: Self-pay

## 2022-10-31 ENCOUNTER — Encounter: Payer: Self-pay | Admitting: Internal Medicine

## 2022-10-31 ENCOUNTER — Other Ambulatory Visit: Payer: Self-pay

## 2022-10-31 DIAGNOSIS — G40901 Epilepsy, unspecified, not intractable, with status epilepticus: Principal | ICD-10-CM | POA: Diagnosis present

## 2022-10-31 DIAGNOSIS — R5381 Other malaise: Secondary | ICD-10-CM | POA: Diagnosis present

## 2022-10-31 DIAGNOSIS — J690 Pneumonitis due to inhalation of food and vomit: Secondary | ICD-10-CM | POA: Diagnosis present

## 2022-10-31 DIAGNOSIS — N179 Acute kidney failure, unspecified: Secondary | ICD-10-CM | POA: Diagnosis present

## 2022-10-31 DIAGNOSIS — E872 Acidosis, unspecified: Secondary | ICD-10-CM | POA: Diagnosis present

## 2022-10-31 DIAGNOSIS — R739 Hyperglycemia, unspecified: Secondary | ICD-10-CM | POA: Diagnosis present

## 2022-10-31 DIAGNOSIS — F17210 Nicotine dependence, cigarettes, uncomplicated: Secondary | ICD-10-CM | POA: Diagnosis present

## 2022-10-31 DIAGNOSIS — R279 Unspecified lack of coordination: Secondary | ICD-10-CM | POA: Diagnosis present

## 2022-10-31 DIAGNOSIS — R001 Bradycardia, unspecified: Secondary | ICD-10-CM | POA: Diagnosis not present

## 2022-10-31 DIAGNOSIS — R482 Apraxia: Secondary | ICD-10-CM | POA: Diagnosis present

## 2022-10-31 DIAGNOSIS — G40401 Other generalized epilepsy and epileptic syndromes, not intractable, with status epilepticus: Secondary | ICD-10-CM | POA: Diagnosis not present

## 2022-10-31 DIAGNOSIS — R7401 Elevation of levels of liver transaminase levels: Secondary | ICD-10-CM | POA: Diagnosis present

## 2022-10-31 DIAGNOSIS — Z85841 Personal history of malignant neoplasm of brain: Secondary | ICD-10-CM

## 2022-10-31 DIAGNOSIS — Z781 Physical restraint status: Secondary | ICD-10-CM

## 2022-10-31 DIAGNOSIS — Z79899 Other long term (current) drug therapy: Secondary | ICD-10-CM

## 2022-10-31 DIAGNOSIS — Z923 Personal history of irradiation: Secondary | ICD-10-CM

## 2022-10-31 DIAGNOSIS — Z9221 Personal history of antineoplastic chemotherapy: Secondary | ICD-10-CM

## 2022-10-31 DIAGNOSIS — R34 Anuria and oliguria: Secondary | ICD-10-CM | POA: Diagnosis not present

## 2022-10-31 DIAGNOSIS — H53462 Homonymous bilateral field defects, left side: Secondary | ICD-10-CM | POA: Diagnosis present

## 2022-10-31 DIAGNOSIS — J9602 Acute respiratory failure with hypercapnia: Secondary | ICD-10-CM | POA: Diagnosis present

## 2022-10-31 DIAGNOSIS — F909 Attention-deficit hyperactivity disorder, unspecified type: Secondary | ICD-10-CM | POA: Diagnosis present

## 2022-10-31 DIAGNOSIS — J9601 Acute respiratory failure with hypoxia: Secondary | ICD-10-CM | POA: Diagnosis present

## 2022-10-31 DIAGNOSIS — R1310 Dysphagia, unspecified: Secondary | ICD-10-CM | POA: Diagnosis present

## 2022-10-31 DIAGNOSIS — E876 Hypokalemia: Secondary | ICD-10-CM | POA: Diagnosis present

## 2022-10-31 DIAGNOSIS — Z91199 Patient's noncompliance with other medical treatment and regimen due to unspecified reason: Secondary | ICD-10-CM

## 2022-10-31 HISTORY — DX: Generalized anxiety disorder: F41.1

## 2022-10-31 HISTORY — DX: Schizoaffective disorder, unspecified: F25.9

## 2022-10-31 HISTORY — DX: Other psychoactive substance abuse, uncomplicated: F19.10

## 2022-10-31 HISTORY — DX: Suicidal ideations: R45.851

## 2022-10-31 LAB — I-STAT ARTERIAL BLOOD GAS, ED
Acid-base deficit: 9 mmol/L — ABNORMAL HIGH (ref 0.0–2.0)
Bicarbonate: 21.8 mmol/L (ref 20.0–28.0)
Calcium, Ion: 1.15 mmol/L (ref 1.15–1.40)
HCT: 46 % (ref 39.0–52.0)
Hemoglobin: 15.6 g/dL (ref 13.0–17.0)
O2 Saturation: 96 %
Patient temperature: 98.6
Potassium: 4.2 mmol/L (ref 3.5–5.1)
Sodium: 140 mmol/L (ref 135–145)
TCO2: 24 mmol/L (ref 22–32)
pCO2 arterial: 65.3 mmHg (ref 32–48)
pH, Arterial: 7.131 — CL (ref 7.35–7.45)
pO2, Arterial: 107 mmHg (ref 83–108)

## 2022-10-31 LAB — I-STAT VENOUS BLOOD GAS, ED
Acid-base deficit: 12 mmol/L — ABNORMAL HIGH (ref 0.0–2.0)
Bicarbonate: 17.4 mmol/L — ABNORMAL LOW (ref 20.0–28.0)
Calcium, Ion: 1.05 mmol/L — ABNORMAL LOW (ref 1.15–1.40)
HCT: 49 % (ref 39.0–52.0)
Hemoglobin: 16.7 g/dL (ref 13.0–17.0)
O2 Saturation: 98 %
Potassium: 4.1 mmol/L (ref 3.5–5.1)
Sodium: 138 mmol/L (ref 135–145)
TCO2: 19 mmol/L — ABNORMAL LOW (ref 22–32)
pCO2, Ven: 52 mmHg (ref 44–60)
pH, Ven: 7.132 — CL (ref 7.25–7.43)
pO2, Ven: 142 mmHg — ABNORMAL HIGH (ref 32–45)

## 2022-10-31 LAB — COMPREHENSIVE METABOLIC PANEL
ALT: 52 U/L — ABNORMAL HIGH (ref 0–44)
AST: 83 U/L — ABNORMAL HIGH (ref 15–41)
Albumin: 4.5 g/dL (ref 3.5–5.0)
Alkaline Phosphatase: 74 U/L (ref 38–126)
Anion gap: 32 — ABNORMAL HIGH (ref 5–15)
BUN: 14 mg/dL (ref 6–20)
CO2: 11 mmol/L — ABNORMAL LOW (ref 22–32)
Calcium: 9.3 mg/dL (ref 8.9–10.3)
Chloride: 96 mmol/L — ABNORMAL LOW (ref 98–111)
Creatinine, Ser: 1.69 mg/dL — ABNORMAL HIGH (ref 0.61–1.24)
GFR, Estimated: 56 mL/min — ABNORMAL LOW (ref >60)
Glucose, Bld: 222 mg/dL — ABNORMAL HIGH (ref 70–99)
Potassium: 4.2 mmol/L (ref 3.5–5.1)
Sodium: 139 mmol/L (ref 135–145)
Total Bilirubin: 0.5 mg/dL (ref 0.3–1.2)
Total Protein: 7.5 g/dL (ref 6.5–8.1)

## 2022-10-31 LAB — LACTIC ACID, PLASMA: Lactic Acid, Venous: 7.4 mmol/L (ref 0.5–1.9)

## 2022-10-31 LAB — CBC WITH DIFFERENTIAL/PLATELET
Abs Immature Granulocytes: 0.88 10*3/uL — ABNORMAL HIGH (ref 0.00–0.07)
Basophils Absolute: 0.1 10*3/uL (ref 0.0–0.1)
Basophils Relative: 1 %
Eosinophils Absolute: 0.1 10*3/uL (ref 0.0–0.5)
Eosinophils Relative: 0 %
HCT: 53.4 % — ABNORMAL HIGH (ref 39.0–52.0)
Hemoglobin: 17.5 g/dL — ABNORMAL HIGH (ref 13.0–17.0)
Immature Granulocytes: 6 %
Lymphocytes Relative: 32 %
Lymphs Abs: 4.6 10*3/uL — ABNORMAL HIGH (ref 0.7–4.0)
MCH: 33.5 pg (ref 26.0–34.0)
MCHC: 32.8 g/dL (ref 30.0–36.0)
MCV: 102.1 fL — ABNORMAL HIGH (ref 80.0–100.0)
Monocytes Absolute: 0.5 10*3/uL (ref 0.1–1.0)
Monocytes Relative: 4 %
Neutro Abs: 8.3 10*3/uL — ABNORMAL HIGH (ref 1.7–7.7)
Neutrophils Relative %: 57 %
Platelets: 243 10*3/uL (ref 150–400)
RBC: 5.23 MIL/uL (ref 4.22–5.81)
RDW: 11.5 % (ref 11.5–15.5)
WBC: 14.5 10*3/uL — ABNORMAL HIGH (ref 4.0–10.5)
nRBC: 0.2 % (ref 0.0–0.2)

## 2022-10-31 MED ORDER — SODIUM CHLORIDE 0.9 % IV SOLN
750.0000 mg | Freq: Once | INTRAVENOUS | Status: AC
  Administered 2022-10-31: 750 mg via INTRAVENOUS
  Filled 2022-10-31: qty 7.5

## 2022-10-31 MED ORDER — FENTANYL BOLUS VIA INFUSION
50.0000 ug | INTRAVENOUS | Status: DC | PRN
  Administered 2022-10-31 – 2022-11-01 (×4): 100 ug via INTRAVENOUS

## 2022-10-31 MED ORDER — ETOMIDATE 2 MG/ML IV SOLN
INTRAVENOUS | Status: DC | PRN
  Administered 2022-10-31: 20 mg via INTRAVENOUS

## 2022-10-31 MED ORDER — PROPOFOL 1000 MG/100ML IV EMUL
5.0000 ug/kg/min | INTRAVENOUS | Status: DC
  Administered 2022-11-01: 40 ug/kg/min via INTRAVENOUS
  Administered 2022-11-01: 35 ug/kg/min via INTRAVENOUS
  Administered 2022-11-01: 50 ug/kg/min via INTRAVENOUS
  Administered 2022-11-01: 40 ug/kg/min via INTRAVENOUS
  Administered 2022-11-02 (×2): 50 ug/kg/min via INTRAVENOUS
  Filled 2022-10-31 (×6): qty 100

## 2022-10-31 MED ORDER — FENTANYL 2500MCG IN NS 250ML (10MCG/ML) PREMIX INFUSION
50.0000 ug/h | INTRAVENOUS | Status: DC
  Administered 2022-10-31: 50 ug/h via INTRAVENOUS
  Administered 2022-11-01: 150 ug/h via INTRAVENOUS
  Administered 2022-11-02: 100 ug/h via INTRAVENOUS
  Administered 2022-11-03: 75 ug/h via INTRAVENOUS
  Filled 2022-10-31 (×4): qty 250

## 2022-10-31 MED ORDER — SODIUM CHLORIDE 0.9 % IV SOLN: 60.0000 mg/kg | Freq: Once | INTRAVENOUS | Status: DC

## 2022-10-31 MED ORDER — ROCURONIUM BROMIDE 50 MG/5ML IV SOLN
INTRAVENOUS | Status: DC | PRN
  Administered 2022-10-31: 70 mg via INTRAVENOUS

## 2022-10-31 MED ORDER — LEVETIRACETAM IN NACL 1500 MG/100ML IV SOLN
3000.0000 mg | Freq: Once | INTRAVENOUS | Status: AC
  Administered 2022-10-31: 3000 mg via INTRAVENOUS
  Filled 2022-10-31: qty 200

## 2022-10-31 MED ORDER — PROPOFOL 1000 MG/100ML IV EMUL
INTRAVENOUS | Status: AC
  Administered 2022-10-31: 5 ug/kg/min via INTRAVENOUS
  Filled 2022-10-31: qty 100

## 2022-10-31 NOTE — ED Notes (Signed)
Pt's mother at bedside and Dr. Mcneil Sober informed and at bedside updating her.

## 2022-10-31 NOTE — ED Triage Notes (Signed)
PER EMS: Pt arrives due to seizure; hx of brain cancer, tumor unable to be removed 2 years ago. Hx of seizures, todays seizure lasted 30 minutes, no consciousness in between. He vomited prior to the seizure when he was laying down. Grand mal activity throughout. He received 5mg  Versed IM without cessation of seizure and then another 5mg  of Versed IV.   BP-160/90, HR-140s-160s, O2-84% NRB, RR-12 shallow and irregular, CBG-240

## 2022-10-31 NOTE — ED Notes (Signed)
Patient transported to CT 

## 2022-10-31 NOTE — ED Provider Notes (Incomplete)
  [place] EM[facility] DEPARTMENT AT [facility] Provider Note  MDM   HPI/ROS:  Stephen Beard is a 27 y.o. male with history of brain tumor and epilepsy presenting via EMS after prolonged generalized tonic-clonic seizure.  Patient was seizing for approximately 30 to 40 minutes and is now status post 5 mg IM Versed and 5 mg IV Versed.  He is extremely postictal on arrival with agonal respirations and sats in the low 80s on 15 L NRB.  Patient was also reportedly markedly hypertensive en route.  Immediately upon presentation, intubation equipment prepared at bedside.  Patient was intubated successfully.  Please see procedure note for further details.  After intubation, patient was Keppra loaded and started on a propofol infusion.  Initial workup to include EKG, CBC, CMP, lactate, VBG, head CT.  Lactate markedly elevated.  Head CT concerning for new hyperdensity concerning for recurrent tumor versus hemorrhage.   Disposition:  {ED Dispo:29898}  Clinical Impression: No diagnosis found.  Rx / DC Orders ED Discharge Orders     None       The plan for this patient was discussed with Dr. ***, who voiced agreement and who oversaw evaluation and treatment of this patient.   Clinical Complexity A medically appropriate history, review of systems, and physical exam was performed.  My independent interpretations of EKG, labs, and radiology are documented in the ED course above.   Click here for ABCD2, HEART and other calculatorsREFRESH Note before signing   Patient's presentation is most consistent with {EM COPA:27473}  Medical Decision Making Amount and/or Complexity of Data Reviewed Labs: ordered. Radiology: ordered.  Risk Prescription drug management.    HPI/ROS      See MDM section for pertinent HPI and ROS. A complete ROS was performed with pertinent positives/negatives noted above.   Past Medical History:  Diagnosis Date  . ADD (attention deficit disorder)    . ADHD   . Bifrontal oligodendroglioma (HCC)   . Headache   . Psychotic disorder (HCC)   . Seizures (HCC)     Past Surgical History:  Procedure Laterality Date  . APPLICATION OF CRANIAL NAVIGATION N/A 07/02/2019   Procedure: APPLICATION OF CRANIAL NAVIGATION;  Surgeon: Jadene Pierini, MD;  Location: [facility] OR;  Service: Neurosurgery;  Laterality: N/A;  . CRANIOTOMY N/A 07/03/2019   Procedure: CRANIOTOMY HEMATOMA EVACUATION SUBDURAL;  Surgeon: Jadene Pierini, MD;  Location: [facility] OR;  Service: Neurosurgery;  Laterality: N/A;  . CRANIOTOMY Right 07/03/2019   Procedure: CRANIOTOMY HEMATOMA EVACUATION SUBDURAL;  Surgeon: Jadene Pierini, MD;  Location: [facility] OR;  Service: Neurosurgery;  Laterality: Right;  . CRANIOTOMY Right 07/02/2019   Procedure: CRANIOTOMY TUMOR EXCISION with Normajean Glasgow;  Surgeon: Jadene Pierini, MD;  Location: [facility] OR;  Service: Neurosurgery;  Laterality: Right;  posterior      Physical Exam   Vitals:   10/31/22 2115 10/31/22 2117 10/31/22 2117 10/31/22 2118  BP: (!) 150/98   139/88  Pulse: (!) 155  (!) 169   SpO2: (!) 83% (!) 82%  (!) 87%    Physical Exam   Procedures    Procedures   Starleen Arms, MD Department of Emergency Medicine   Please note that this documentation was produced with the assistance of voice-to-text technology and may contain errors.

## 2022-10-31 NOTE — Progress Notes (Signed)
MRI leads used as per [place]

## 2022-10-31 NOTE — Progress Notes (Signed)
LTM EEG running - no initial skin breakdown - push button tested - neuro notified.  

## 2022-10-31 NOTE — ED Notes (Signed)
Dr. Aurora at bedside

## 2022-10-31 NOTE — Progress Notes (Signed)
Transported patient to C.T while patient was on the ventilator. Patient remained stable during transport.  

## 2022-10-31 NOTE — ED Provider Notes (Signed)
[place] EMERGENCY DEPARTMENT AT [facility] Provider Note  MDM   HPI/ROS:  Stephen Beard is a 27 y.o. male with history of brain tumor and epilepsy presenting via EMS after prolonged generalized tonic-clonic seizure.  Patient was seizing for approximately 30 to 40 minutes and is now status post 5 mg IM Versed and 5 mg IV Versed.  He is extremely postictal on arrival with agonal respirations and sats in the low 80s on 15 L NRB.  Patient was also reportedly markedly hypertensive and febrile en route.  Immediately upon presentation, intubation equipment prepared at bedside.  Patient was intubated successfully.  Please see procedure note for further details.  After intubation, patient was Keppra loaded and started on a propofol infusion.  OG tube was placed.  Initial workup to include EKG, CBC, CMP, lactate, VBG, head CT.  AED levels obtained as well.  Blood cultures drawn given reports of fevers.  Lactate markedly elevated.  ABG with pH of 7.1 pCO2 of 65. Head CT concerning for new hyperdensity concerning for recurrent tumor versus hemorrhage.  MRI ordered.  Neurology consulted.  Recommending to admit to intensivist.  Intensivist paged for admission.  Care of this patient was assumed by oncoming team providers at time of signout at approximately midnight.  Please see oncoming provider notes for further details.  Disposition:  Handoff  Clinical Impression: No diagnosis found.  Rx / DC Orders ED Discharge Orders     None       The plan for this patient was discussed with Dr. Suezanne Jacquet, who voiced agreement and who oversaw evaluation and treatment of this patient.   Clinical Complexity A medically appropriate history, review of systems, and physical exam was performed.  My independent interpretations of EKG, labs, and radiology are documented in the ED course above.   Click here for ABCD2, HEART and other calculatorsREFRESH Note before signing   Patient's  presentation is most consistent with acute presentation with potential threat to life or bodily function.  Medical Decision Making Amount and/or Complexity of Data Reviewed Labs: ordered. Radiology: ordered.  Risk Prescription drug management.    HPI/ROS      See MDM section for pertinent HPI and ROS. A complete ROS was performed with pertinent positives/negatives noted above.   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HCC)    Headache    Psychotic disorder (HCC)    Seizures (HCC)     Past Surgical History:  Procedure Laterality Date   APPLICATION OF CRANIAL NAVIGATION N/A 07/02/2019   Procedure: APPLICATION OF CRANIAL NAVIGATION;  Surgeon: Jadene Pierini, MD;  Location: [facility] OR;  Service: Neurosurgery;  Laterality: N/A;   CRANIOTOMY N/A 07/03/2019   Procedure: CRANIOTOMY HEMATOMA EVACUATION SUBDURAL;  Surgeon: Jadene Pierini, MD;  Location: [facility] OR;  Service: Neurosurgery;  Laterality: N/A;   CRANIOTOMY Right 07/03/2019   Procedure: CRANIOTOMY HEMATOMA EVACUATION SUBDURAL;  Surgeon: Jadene Pierini, MD;  Location: [facility] OR;  Service: Neurosurgery;  Laterality: Right;   CRANIOTOMY Right 07/02/2019   Procedure: CRANIOTOMY TUMOR EXCISION with Normajean Glasgow;  Surgeon: Jadene Pierini, MD;  Location: [facility] OR;  Service: Neurosurgery;  Laterality: Right;  posterior      Physical Exam   Vitals:   10/31/22 2151 10/31/22 2200 10/31/22 2315 10/31/22 2333  BP: (!) 145/109 (!) 153/110 116/87   Pulse: (!) 119 (!) 115 (!) 106   Resp: (!) 21 (!) 25 [place])  30   Temp: (!) 100.7 F (38.2 C)     TempSrc: Axillary     SpO2: 94% 95% 100% 100%  Weight:      Height:        Physical Exam Vitals and nursing note reviewed.  Constitutional:      General: He is in acute distress.     Appearance: He is well-developed. He is ill-appearing, toxic-appearing and diaphoretic.  HENT:     Head: Normocephalic and atraumatic.   Eyes:     Conjunctiva/sclera: Conjunctivae normal.  Cardiovascular:     Rate and Rhythm: Regular rhythm. Tachycardia present.     Heart sounds: No murmur heard. Pulmonary:     Effort: Respiratory distress present.  Musculoskeletal:        General: No swelling.     Cervical back: Neck supple.  Skin:    General: Skin is warm.     Capillary Refill: Capillary refill takes less than 2 seconds.  Neurological:     Mental Status: He is disoriented.      Procedures    Procedure Name: Intubation Date/Time: 11/01/2022 12:15 AM  Performed by: Dyanne Iha, MDPre-anesthesia Checklist: Emergency Drugs available, Patient being monitored and Timeout performed Oxygen Delivery Method: Non-rebreather mask Preoxygenation: Pre-oxygenation with 100% oxygen Induction Type: IV induction and Rapid sequence Ventilation: Mask ventilation without difficulty Laryngoscope Size: Mac and 4 Grade View: Grade I Tube size: 7.5 mm Number of attempts: 1 Airway Equipment and Method: Video-laryngoscopy and Stylet Placement Confirmation: ETT inserted through vocal cords under direct vision, Positive ETCO2, CO2 detector and Breath sounds checked- equal and bilateral Secured at: 26 cm Tube secured with: ETT holder     Starleen Arms, MD Department of Emergency Medicine   Please note that this documentation was produced with the assistance of voice-to-text technology and may contain errors.    Dyanne Iha, MD 11/01/22 Lorenda Cahill    Lonell Grandchild, MD 11/01/22 [id number]    Lonell Grandchild, MD 11/01/22 [phone]

## 2022-10-31 NOTE — Progress Notes (Signed)
Hookup in ED, [facility] NOT MONITORING

## 2022-10-31 NOTE — ED Notes (Signed)
EEG tech at bedside. 

## 2022-10-31 NOTE — ED Notes (Addendum)
Pt intubaed by Dr. Mcneil Sober, 26@ lips, 7.5 ETT

## 2022-10-31 NOTE — Consult Note (Signed)
Neurology Consultation  Reason for Consult: Breakthrough seizures Referring Physician: Dr. Suezanne Jacquet  CC: Breakthrough seizures  History is obtained from: Chart, mother  HPI: Stephen Beard is a 27 y.o. 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  Neurology was consulted for further management. Mother reports that he was in his usual state of health and started seizing having generalized seizures for which she called EMS.  He received benzos on the way but his seizures did not break.  He was emergently intubated in the ER, given Keppra and more benzodiazepines which abated the clinical seizure activity.  He is to restart medical attention because of several months of progressive headaches and left-sided visual impairment along with episodes of dj vu and paroxysmal burning smells which would occur almost daily over that time.  His brain imaging showed a large parieto-occipital mass with herniation syndrome.  His mass was resected/debulked and postoperatively the course was complicated by intracranial hematoma and respiratory failure.  He has had seizures since then and has been on lamotrigine which mother says he is compliant to.  Current lamotrigine dose from the neurology note from 04/25/2022-250 twice daily.  Recommendation of adding Vimpat if focal left arm symptoms were to worsen.  Has been on Keppra in the past but was discontinued and monotherapy with lamotrigine has been pursued due to concerns for prior medication noncompliance as well as drug use.  ROS: Unable to ascertain due to altered mental status  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HCC)    Headache    Psychotic disorder (HCC)    Seizures (HCC)      History  reviewed. No pertinent family history.   Social History:   reports that he has been smoking. He has a 12 pack-year smoking history. His smokeless tobacco use includes chew. He reports current alcohol use. He reports current drug use. Drug: Cocaine.  Medications  Current Facility-Administered Medications:    etomidate (AMIDATE) injection, , Intravenous, Code/Trauma/Sedation Med, Lonell Grandchild, MD, 20 mg at 10/31/22 2115   [COMPLETED] levETIRAcetam (KEPPRA) IVPB 1500 mg/ 100 mL premix, 3,000 mg, Intravenous, Once, Last Rate: 400 mL/hr at 10/31/22 2157, 3,000 mg at 10/31/22 2157 **FOLLOWED BY** levETIRAcetam (KEPPRA) 750 mg in sodium chloride 0.9 % 100 mL IVPB, 750 mg, Intravenous, Once, Lonell Grandchild, MD   propofol (DIPRIVAN) 1000 MG/100ML infusion, 5-80 mcg/kg/min, Intravenous, Continuous, Lonell Grandchild, MD, Last Rate: 1.92 mL/hr at 10/31/22 2136, 5 mcg/kg/min at 10/31/22 2136   rocuronium (ZEMURON) injection, , Intravenous, Code/Trauma/Sedation Med, Lonell Grandchild, MD, 70 mg at 10/31/22 2116  Current Outpatient Medications:    acetaminophen (TYLENOL) 500 MG tablet, Take 500 mg by mouth every 6 (six) hours as needed for moderate pain., Disp: , Rfl:    ibuprofen (ADVIL) 200 MG tablet, Take 200 mg by mouth every 6 (six) hours as needed for headache (pain)., Disp: , Rfl:    lamoTRIgine (LAMICTAL) 100 MG tablet, TAKE 1 TABLET BY MOUTH TWICE DAILY.TAKE ALONG WITH ONE 150 MG TABLET (250 MG TWICE DAILY TOTAL), Disp: 60 tablet, Rfl: 0   lamoTRIgine (LAMICTAL) 150 MG tablet, TAKE 1 TABLET BY MOUTH TWICE DAILY.TAKE WITH 100 MG TABLET.(250 MG TWICE DAILY TOTAL)., Disp: 60 tablet, Rfl: 0   levETIRAcetam (KEPPRA) 500 MG tablet, Take 1 tablet (500  mg total) by mouth 2 (two) times daily., Disp: 60 tablet, Rfl: 0   traZODone (DESYREL) 100 MG tablet, TAKE 1 TABLET BY MOUTH AT BEDTIME AS NEEDED FOR SLEEP (Patient taking differently: Take 100 mg by mouth at bedtime as needed for sleep.), Disp:  30 tablet, Rfl: 0   Exam: Current vital signs: BP (!) 153/110   Pulse (!) 115   Temp (!) 100.7 F (38.2 C) (Axillary)   Resp (!) 25   Ht 5\' 8"  (1.727 m)   Wt 64 kg   SpO2 95%   BMI 21.44 kg/m  Vital signs in last 24 hours: Temp:  [100.7 F (38.2 C)] 100.7 F (38.2 C) (08/19 2151) Pulse Rate:  [115-169] 115 (08/19 2200) Resp:  [21-25] 25 (08/19 2200) BP: (139-153)/(88-110) 153/110 (08/19 2200) SpO2:  [82 %-95 %] 95 % (08/19 2200) FiO2 (%):  [100 %] 100 % (08/19 2132) Weight:  [16 kg] 64 kg (08/19 2128) General: Sedated intubated HEENT: Normocephalic atraumatic Lungs: Vented Cardiovascular: S1-S2 heard, regular rhythm Neurological exam Sedated intubated No spontaneous movement No movement to noxious simulation Pupils are equal round reactive to light Gaze is midline Facial symmetry difficult to ascertain Breathing over the ventilator Positive cough and gag  Positive corneals bilaterally.  Labs I have reviewed labs in epic and the results pertinent to this consultation are:  CBC    Component Value Date/Time   WBC 14.5 (H) 10/31/2022 2110   RBC 5.23 10/31/2022 2110   HGB 15.6 10/31/2022 2210   HGB 17.0 05/17/2021 1021   HCT 46.0 10/31/2022 2210   PLT 243 10/31/2022 2110   PLT 160 05/17/2021 1021   MCV 102.1 (H) 10/31/2022 2110   MCH 33.5 10/31/2022 2110   MCHC 32.8 10/31/2022 2110   RDW 11.5 10/31/2022 2110   LYMPHSABS 4.6 (H) 10/31/2022 2110   MONOABS 0.5 10/31/2022 2110   EOSABS 0.1 10/31/2022 2110   BASOSABS 0.1 10/31/2022 2110    CMP     Component Value Date/Time   NA 140 10/31/2022 2210   K 4.2 10/31/2022 2210   CL 96 (L) 10/31/2022 2110   CO2 11 (L) 10/31/2022 2110   GLUCOSE 222 (H) 10/31/2022 2110   BUN 14 10/31/2022 2110   CREATININE 1.69 (H) 10/31/2022 2110   CREATININE 0.95 05/17/2021 1021   CALCIUM 9.3 10/31/2022 2110   PROT 7.5 10/31/2022 2110   ALBUMIN 4.5 10/31/2022 2110   AST 83 (H) 10/31/2022 2110   AST 37 05/17/2021 1021    ALT 52 (H) 10/31/2022 2110   ALT 48 (H) 05/17/2021 1021   ALKPHOS 74 10/31/2022 2110   BILITOT 0.5 10/31/2022 2110   BILITOT 1.1 05/17/2021 1021   GFRNONAA 56 (L) 10/31/2022 2110   GFRNONAA >60 05/17/2021 1021   GFRAA >60 12/05/2019 1206    Lipid Panel     Component Value Date/Time   TRIG 62 05/23/2021 0158   Urine and blood toxicology pending. Serum ethanol undetectable   Imaging I have reviewed the images obtained:  CT-head-formal read pending.  Appears grossly unchanged  Assessment:  27 year old with past medical history of anaplastic oligodendroglioma in the right parieto-occipital region status post resection/debulking complicated by intracerebral hematoma, status post chemotherapy, history of seizures on lamotrigine with questionable compliance to medications in the past with mother reporting good compliance now with breakthrough seizure lasting 30 to 40 minutes with concern for status epilepticus. Emergently intubated for airway protection. Limited exam due to sedation for recent intubation. Neurology consulted for status epilepticus.  Impression: Breakthrough seizures and status epilepticus-likely in the setting of noncompliance versus tumor progression versus underlying infections. Needs further workup  Recommendations: -Continue home lamotrigine of 250 twice daily once he has OG tube or NG tube placed. -He has received Keppra IV 3000 mg and is currently on propofol. -I would hook him up to LTM EEG and based on whether he is in status, make changes to antiepileptics. -I would continue Keppra 750 twice daily for now -Seizure precautions -Stat EEG followed by LTM EEG -MRI w+w/o contrast at some point tomorrow-will have MRI safe leads put in for EEG so that there is no delay in getting imaging in the morning. -CT head no evidence of bleed.  Plan discussed with ER resident physician Dr. Mcneil Sober, EDP Dr Suezanne Jacquet and patient's mother at bedside.  Neurology will  follow.  -- Milon Dikes, MD Neurologist [facility] Pager: [phone]   CRITICAL CARE ATTESTATION Performed by: Milon Dikes, MD Total critical care time: 35 minutes Critical care time was exclusive of separately billable procedures and treating other patients and/or supervising APPs/Residents/Students Critical care was necessary to treat or prevent imminent or life-threatening deterioration. This patient is critically ill and at significant risk for neurological worsening and/or death and care requires constant monitoring. Critical care was time spent personally by me on the following activities: development of treatment plan with patient and/or surrogate as well as nursing, discussions with consultants, evaluation of patient's response to treatment, examination of patient, obtaining history from patient or surrogate, ordering and performing treatments and interventions, ordering and review of laboratory studies, ordering and review of radiographic studies, pulse oximetry, re-evaluation of patient's condition, participation in multidisciplinary rounds and medical decision making of high complexity in the care of this patient.

## 2022-11-01 ENCOUNTER — Inpatient Hospital Stay (HOSPITAL_COMMUNITY): Payer: Medicare Other

## 2022-11-01 ENCOUNTER — Encounter: Payer: Self-pay | Admitting: Internal Medicine

## 2022-11-01 DIAGNOSIS — R1310 Dysphagia, unspecified: Secondary | ICD-10-CM | POA: Diagnosis present

## 2022-11-01 DIAGNOSIS — Z79899 Other long term (current) drug therapy: Secondary | ICD-10-CM | POA: Diagnosis not present

## 2022-11-01 DIAGNOSIS — R482 Apraxia: Secondary | ICD-10-CM | POA: Diagnosis present

## 2022-11-01 DIAGNOSIS — J9601 Acute respiratory failure with hypoxia: Secondary | ICD-10-CM | POA: Diagnosis present

## 2022-11-01 DIAGNOSIS — R739 Hyperglycemia, unspecified: Secondary | ICD-10-CM | POA: Diagnosis present

## 2022-11-01 DIAGNOSIS — R5381 Other malaise: Secondary | ICD-10-CM | POA: Diagnosis present

## 2022-11-01 DIAGNOSIS — J690 Pneumonitis due to inhalation of food and vomit: Secondary | ICD-10-CM | POA: Diagnosis present

## 2022-11-01 DIAGNOSIS — I6200 Nontraumatic subdural hemorrhage, unspecified: Secondary | ICD-10-CM | POA: Diagnosis not present

## 2022-11-01 DIAGNOSIS — G40401 Other generalized epilepsy and epileptic syndromes, not intractable, with status epilepticus: Secondary | ICD-10-CM | POA: Diagnosis present

## 2022-11-01 DIAGNOSIS — R34 Anuria and oliguria: Secondary | ICD-10-CM | POA: Diagnosis not present

## 2022-11-01 DIAGNOSIS — F909 Attention-deficit hyperactivity disorder, unspecified type: Secondary | ICD-10-CM | POA: Diagnosis present

## 2022-11-01 DIAGNOSIS — H53462 Homonymous bilateral field defects, left side: Secondary | ICD-10-CM | POA: Diagnosis present

## 2022-11-01 DIAGNOSIS — Z923 Personal history of irradiation: Secondary | ICD-10-CM | POA: Diagnosis not present

## 2022-11-01 DIAGNOSIS — G40901 Epilepsy, unspecified, not intractable, with status epilepticus: Secondary | ICD-10-CM | POA: Diagnosis not present

## 2022-11-01 DIAGNOSIS — N179 Acute kidney failure, unspecified: Secondary | ICD-10-CM | POA: Diagnosis present

## 2022-11-01 DIAGNOSIS — C711 Malignant neoplasm of frontal lobe: Secondary | ICD-10-CM | POA: Diagnosis not present

## 2022-11-01 DIAGNOSIS — Z9221 Personal history of antineoplastic chemotherapy: Secondary | ICD-10-CM | POA: Diagnosis not present

## 2022-11-01 DIAGNOSIS — Z91199 Patient's noncompliance with other medical treatment and regimen due to unspecified reason: Secondary | ICD-10-CM | POA: Diagnosis not present

## 2022-11-01 DIAGNOSIS — E872 Acidosis, unspecified: Secondary | ICD-10-CM | POA: Diagnosis present

## 2022-11-01 DIAGNOSIS — Z85841 Personal history of malignant neoplasm of brain: Secondary | ICD-10-CM | POA: Diagnosis not present

## 2022-11-01 DIAGNOSIS — E876 Hypokalemia: Secondary | ICD-10-CM | POA: Diagnosis present

## 2022-11-01 DIAGNOSIS — R279 Unspecified lack of coordination: Secondary | ICD-10-CM | POA: Diagnosis present

## 2022-11-01 DIAGNOSIS — J988 Other specified respiratory disorders: Secondary | ICD-10-CM | POA: Diagnosis not present

## 2022-11-01 DIAGNOSIS — F17210 Nicotine dependence, cigarettes, uncomplicated: Secondary | ICD-10-CM | POA: Diagnosis present

## 2022-11-01 DIAGNOSIS — F1910 Other psychoactive substance abuse, uncomplicated: Secondary | ICD-10-CM | POA: Diagnosis not present

## 2022-11-01 DIAGNOSIS — Z781 Physical restraint status: Secondary | ICD-10-CM | POA: Diagnosis not present

## 2022-11-01 DIAGNOSIS — J9602 Acute respiratory failure with hypercapnia: Secondary | ICD-10-CM | POA: Diagnosis present

## 2022-11-01 DIAGNOSIS — R7401 Elevation of levels of liver transaminase levels: Secondary | ICD-10-CM | POA: Diagnosis present

## 2022-11-01 DIAGNOSIS — R001 Bradycardia, unspecified: Secondary | ICD-10-CM | POA: Diagnosis not present

## 2022-11-01 LAB — I-STAT ARTERIAL BLOOD GAS, ED
Acid-base deficit: 3 mmol/L — ABNORMAL HIGH (ref 0.0–2.0)
Acid-base deficit: 2 mmol/L (ref 0.0–2.0)
Bicarbonate: 20.7 mmol/L (ref 20.0–28.0)
Bicarbonate: 20.5 mmol/L (ref 20.0–28.0)
Calcium, Ion: 1.07 mmol/L — ABNORMAL LOW (ref 1.15–1.40)
Calcium, Ion: 1.11 mmol/L — ABNORMAL LOW (ref 1.15–1.40)
HCT: 43 % (ref 39.0–52.0)
HCT: 42 % (ref 39.0–52.0)
Hemoglobin: 14.6 g/dL (ref 13.0–17.0)
Hemoglobin: 14.3 g/dL (ref 13.0–17.0)
O2 Saturation: 100 %
O2 Saturation: 99 %
Patient temperature: 100.6
Patient temperature: 98.6
Potassium: 3.3 mmol/L — ABNORMAL LOW (ref 3.5–5.1)
Potassium: 3.3 mmol/L — ABNORMAL LOW (ref 3.5–5.1)
Sodium: 137 mmol/L (ref 135–145)
Sodium: 135 mmol/L (ref 135–145)
TCO2: 22 mmol/L (ref 22–32)
TCO2: 21 mmol/L — ABNORMAL LOW (ref 22–32)
pCO2 arterial: 35 mmHg (ref 32–48)
pCO2 arterial: 28.6 mmHg — ABNORMAL LOW (ref 32–48)
pH, Arterial: 7.385 (ref 7.35–7.45)
pH, Arterial: 7.464 — ABNORMAL HIGH (ref 7.35–7.45)
pO2, Arterial: 294 mmHg — ABNORMAL HIGH (ref 83–108)
pO2, Arterial: 143 mmHg — ABNORMAL HIGH (ref 83–108)

## 2022-11-01 LAB — URINALYSIS, ROUTINE W REFLEX MICROSCOPIC
Bilirubin Urine: NEGATIVE
Glucose, UA: NEGATIVE mg/dL
Ketones, ur: 20 mg/dL — AB
Leukocytes,Ua: NEGATIVE
Nitrite: NEGATIVE
Protein, ur: 100 mg/dL — AB
Specific Gravity, Urine: 1.02 (ref 1.005–1.030)
pH: 5 (ref 5.0–8.0)

## 2022-11-01 LAB — BASIC METABOLIC PANEL
Anion gap: 12 (ref 5–15)
Anion gap: 12 (ref 5–15)
BUN: 16 mg/dL (ref 6–20)
BUN: 10 mg/dL (ref 6–20)
CO2: 19 mmol/L — ABNORMAL LOW (ref 22–32)
CO2: 22 mmol/L (ref 22–32)
Calcium: 7.8 mg/dL — ABNORMAL LOW (ref 8.9–10.3)
Calcium: 7.8 mg/dL — ABNORMAL LOW (ref 8.9–10.3)
Chloride: 104 mmol/L (ref 98–111)
Chloride: 104 mmol/L (ref 98–111)
Creatinine, Ser: 1 mg/dL (ref 0.61–1.24)
Creatinine, Ser: 1.22 mg/dL (ref 0.61–1.24)
GFR, Estimated: >60 mL/min (ref >60)
GFR, Estimated: >60 mL/min (ref >60)
Glucose, Bld: 93 mg/dL (ref 70–99)
Glucose, Bld: 79 mg/dL (ref 70–99)
Potassium: 3.3 mmol/L — ABNORMAL LOW (ref 3.5–5.1)
Potassium: 3.3 mmol/L — ABNORMAL LOW (ref 3.5–5.1)
Sodium: 135 mmol/L (ref 135–145)
Sodium: 138 mmol/L (ref 135–145)

## 2022-11-01 LAB — CBC
HCT: 45 % (ref 39.0–52.0)
Hemoglobin: 16 g/dL (ref 13.0–17.0)
MCH: 33.8 pg (ref 26.0–34.0)
MCHC: 35.6 g/dL (ref 30.0–36.0)
MCV: 95.1 fL (ref 80.0–100.0)
Platelets: 141 10*3/uL — ABNORMAL LOW (ref 150–400)
RBC: 4.73 MIL/uL (ref 4.22–5.81)
RDW: 11.7 % (ref 11.5–15.5)
WBC: 9.9 10*3/uL (ref 4.0–10.5)
nRBC: 0 % (ref 0.0–0.2)

## 2022-11-01 LAB — RAPID URINE DRUG SCREEN, HOSP PERFORMED
Amphetamines: NOT DETECTED
Barbiturates: NOT DETECTED
Benzodiazepines: POSITIVE — AB
Cocaine: NOT DETECTED
Opiates: NOT DETECTED
Tetrahydrocannabinol: NOT DETECTED

## 2022-11-01 LAB — HIV ANTIBODY (ROUTINE TESTING W REFLEX): HIV Screen 4th Generation wRfx: NONREACTIVE

## 2022-11-01 LAB — I-STAT CG4 LACTIC ACID, ED: Lactic Acid, Venous: 1.4 mmol/L (ref 0.5–1.9)

## 2022-11-01 LAB — PHOSPHORUS: Phosphorus: 2.7 mg/dL (ref 2.5–4.6)

## 2022-11-01 LAB — HEMOGLOBIN A1C
Hgb A1c MFr Bld: 5.3 % (ref 4.8–5.6)
Mean Plasma Glucose: 105.41 mg/dL

## 2022-11-01 LAB — MAGNESIUM: Magnesium: 2.5 mg/dL — ABNORMAL HIGH (ref 1.7–2.4)

## 2022-11-01 MED ORDER — LACTATED RINGERS IV SOLN: INTRAVENOUS | Status: DC

## 2022-11-01 MED ORDER — HEPARIN SODIUM (PORCINE) 5000 UNIT/ML IJ SOLN
5000.0000 [IU] | Freq: Three times a day (TID) | INTRAMUSCULAR | Status: DC
  Administered 2022-11-01 – 2022-11-07 (×19): 5000 [IU] via SUBCUTANEOUS
  Filled 2022-11-01 (×19): qty 1

## 2022-11-01 MED ORDER — SODIUM CHLORIDE 0.9 % IV SOLN
750.0000 mg | Freq: Two times a day (BID) | INTRAVENOUS | Status: DC
  Administered 2022-11-01 – 2022-11-03 (×5): 750 mg via INTRAVENOUS
  Filled 2022-11-01 (×6): qty 7.5

## 2022-11-01 MED ORDER — NOREPINEPHRINE 4 MG/250ML-% IV SOLN
2.0000 ug/min | INTRAVENOUS | Status: DC
  Administered 2022-11-01: 2 ug/min via INTRAVENOUS
  Filled 2022-11-01: qty 250

## 2022-11-01 MED ORDER — DOCUSATE SODIUM 50 MG/5ML PO LIQD
100.0000 mg | Freq: Two times a day (BID) | ORAL | Status: DC | PRN
  Administered 2022-11-02: 100 mg
  Filled 2022-11-01: qty 10

## 2022-11-01 MED ORDER — ORAL CARE MOUTH RINSE: 15.0000 mL | OROMUCOSAL | Status: DC | PRN

## 2022-11-01 MED ORDER — GADOBUTROL 1 MMOL/ML IV SOLN
7.0000 mL | Freq: Once | INTRAVENOUS | Status: AC | PRN
  Administered 2022-11-01: 7 mL via INTRAVENOUS

## 2022-11-01 MED ORDER — ORAL CARE MOUTH RINSE
15.0000 mL | OROMUCOSAL | Status: DC
  Administered 2022-11-01 – 2022-11-04 (×36): 15 mL via OROMUCOSAL

## 2022-11-01 MED ORDER — POLYETHYLENE GLYCOL 3350 17 G PO PACK: 17.0000 g | PACK | Freq: Every day | ORAL | Status: DC | PRN

## 2022-11-01 MED ORDER — SODIUM CHLORIDE 0.9 % IV SOLN
3.0000 g | Freq: Four times a day (QID) | INTRAVENOUS | Status: AC
  Administered 2022-11-01 – 2022-11-05 (×20): 3 g via INTRAVENOUS
  Filled 2022-11-01 (×20): qty 8

## 2022-11-01 MED ORDER — LAMOTRIGINE 100 MG PO TABS
250.0000 mg | ORAL_TABLET | Freq: Two times a day (BID) | ORAL | Status: DC
  Administered 2022-11-01 – 2022-11-07 (×14): 250 mg
  Filled 2022-11-01 (×4): qty 3
  Filled 2022-11-01: qty 2.5
  Filled 2022-11-01 (×3): qty 3
  Filled 2022-11-01 (×2): qty 2.5
  Filled 2022-11-01 (×5): qty 3

## 2022-11-01 MED ORDER — LAMOTRIGINE 25 MG PO TABS: 250.0000 mg | ORAL_TABLET | Freq: Two times a day (BID) | ORAL | Status: DC

## 2022-11-01 MED ORDER — POTASSIUM CHLORIDE 20 MEQ PO PACK
40.0000 meq | PACK | Freq: Once | ORAL | Status: AC
  Administered 2022-11-01: 40 meq
  Filled 2022-11-01: qty 2

## 2022-11-01 MED ORDER — SODIUM CHLORIDE 0.9 % IV SOLN
250.0000 mL | INTRAVENOUS | Status: DC
  Administered 2022-11-01 – 2022-11-03 (×2): 250 mL via INTRAVENOUS

## 2022-11-01 MED ORDER — FAMOTIDINE 20 MG PO TABS
20.0000 mg | ORAL_TABLET | Freq: Two times a day (BID) | ORAL | Status: DC
  Administered 2022-11-01 – 2022-11-03 (×6): 20 mg
  Filled 2022-11-01 (×6): qty 1

## 2022-11-01 MED ORDER — CHLORHEXIDINE GLUCONATE CLOTH 2 % EX PADS
6.0000 | MEDICATED_PAD | Freq: Every day | CUTANEOUS | Status: DC
  Administered 2022-11-01 – 2022-11-04 (×5): 6 via TOPICAL

## 2022-11-01 MED ORDER — LACTATED RINGERS IV BOLUS
500.0000 mL | Freq: Once | INTRAVENOUS | Status: AC
  Administered 2022-11-01: 500 mL via INTRAVENOUS

## 2022-11-01 NOTE — Progress Notes (Signed)
Pharmacy Antibiotic Note  Stephen Beard is a 27 y.o. male admitted on 10/31/2022 for seizures with concerns for aspiration pneumonia.  Pharmacy has been consulted for Unasyn dosing. WBC 14.5, sCr 1.69 (bl~1), lactate 7.4 >> 1.4, last documented temp 100.7. No other Abx given.  Plan: Unasyn 3g IV every 6 hours Monitor renal function Follow up for LOT, signs of clinical improvement, fever curve, cultures  Height: 5\' 8"  (172.7 cm) Weight: 64 kg (141 lb) IBW/kg (Calculated) : 68.4  Temp (24hrs), Avg:100.7 F (38.2 C), Min:100.7 F (38.2 C), Max:100.7 F (38.2 C)  Recent Labs  Lab 10/31/22 2110 10/31/22 2147 11/01/22 0119  WBC 14.5*  --   --   CREATININE 1.69*  --   --   LATICACIDVEN  --  7.4* 1.4    Estimated Creatinine Clearance: 58.9 mL/min (A) (by C-G formula based on SCr of 1.69 mg/dL (H)).    No Known Allergies  Antimicrobials this admission: Unasyn 8/20 >>   Microbiology results: 8/20 BCx:   Thank you for allowing pharmacy to be a part of this patient's care.  Arabella Merles, PharmD. Clinical Pharmacist 11/01/2022 2:05 AM

## 2022-11-01 NOTE — Progress Notes (Signed)
Patient was transported to MRI & back to 4N20 without any complications.

## 2022-11-01 NOTE — ED Notes (Signed)
Critical care provider paged regarding urinary retention.

## 2022-11-01 NOTE — Progress Notes (Signed)
Pt moved to 4N EEG running and [facility] now monitoring

## 2022-11-01 NOTE — Progress Notes (Addendum)
NAME:  Stephen Beard, MRN:  [id number], DOB:  [birth year], LOS: 0 ADMISSION DATE:  10/31/2022, CONSULTATION DATE: 8/20 REFERRING MD:  Dr Suezanne Jacquet, CHIEF COMPLAINT: Status epilepticus  History of Present Illness:  27 year old male with past medical history as below, which is significant for anaplastic oligodendroglioma s/p resection and chemotherapy.  He has had seizures since that time which have not been managed with lamotrigine.  In the evening hours of 8/20 he suffered a seizure at home which went on for 15 minutes before the arrival of EMS.  He was treated with benzodiazepines by EMS without improvement.  He continued to seize upon arrival to the emergency department he was treated with additional benzodiazepines and IV Keppra bolus.  Duration of seizure activity estimated at 30 minutes.  Seizures did then eventually break, but the patient was unresponsive and desaturating requiring intubation in the emergency department.  He was evaluated by neurology in the emergency department who recommended additional Keppra, EEG, and MRI.  PCCM was asked to evaluate the patient for admission due to intubated status.  Pertinent  Medical History   has a past medical history of ADD (attention deficit disorder), ADHD, Bifrontal oligodendroglioma (HCC), Headache, Psychotic disorder (HCC), and Seizures (HCC).   Significant Hospital Events: Including procedures, antibiotic start and stop dates in addition to other pertinent events     Interim History / Subjective:  Remains on 100% FiO2 check ABG  Objective   Blood pressure 97/66, pulse 75, temperature 99.6 F (37.6 C), resp. rate (!) 28, height 5\' 8"  (1.727 m), weight 64 kg, SpO2 100%.    Vent Mode: PRVC FiO2 (%):  [40 %-100 %] 40 % Set Rate:  [22 bmp-30 bmp] 28 bmp Vt Set:  [550 mL] 550 mL PEEP:  [8 cmH20-10 cmH20] 8 cmH20 Plateau Pressure:  [24 cmH20-28 cmH20] 24 cmH20   Intake/Output Summary (Last 24 hours) at 11/01/2022 1119 Last data filed  at 11/01/2022 4098 Gross per 24 hour  Intake 359.97 ml  Output 700 ml  Net -340.03 ml   Filed Weights   10/31/22 2128  Weight: 64 kg    Examination:  Male heavily sedated on full mechanical ventilator support  Endotracheal tube is in place Equal bilateral breath sounds are heard Heart sounds are regular And soft nontender Extremities are without edema Neurological remains heavily sedated   Resolved Hospital Problem list     Assessment & Plan:   Status epilepticus Hx anaplastic oligodendroglioma s/p resection and chemotherapy CT stable per neurology read Admit to neuro ICU Continue antiepileptics Sedation Appreciate neurology's input Further evaluation with MRI    Acute respiratory failure with hypoxia and hypercarbia: Repeat ABG adjust ventilator as needed Continue sedation for now Weaning parameters per neurology      AKI:  Lab Results  Component Value Date   CREATININE 1.00 11/01/2022   CREATININE 1.69 (H) 10/31/2022   CREATININE 1.05 05/31/2022   CREATININE 0.95 05/17/2021   CREATININE 0.85 02/15/2021   CREATININE 0.78 05/28/2020   Recent Labs  Lab 11/01/22 0158 11/01/22 0342 11/01/22 0606  K 3.3* 3.3* 3.3*    Avoid nephrotoxins Replete potassium  Hyperglycemia with no history of DM: CBG (last 3)  No results for input(s): "GLUCAP" in the last 72 hours.  Sliding-scale insulin protocol  Elevated transaminases: Monitor    Leukocytosis: likely reactive to the seizure, cannot rule out aspiration Empirical Unasyn WBC   Best Practice (right click and "Reselect all SmartList Selections" daily)   Diet/type: NPO DVT prophylaxis: prophylactic  heparin  GI prophylaxis: H2B Lines: N/A Foley:  Yes, and it is still needed Code Status:  full code Last date of multidisciplinary goals of care discussion [ tbd  ]  Labs   CBC: Recent Labs  Lab 10/31/22 2110 10/31/22 2158 10/31/22 2210 11/01/22 0158 11/01/22 0342 11/01/22 0606  WBC  14.5*  --   --   --  9.9  --   NEUTROABS 8.3*  --   --   --   --   --   HGB 17.5* 16.7 15.6 14.6 16.0 14.3  HCT 53.4* 49.0 46.0 43.0 45.0 42.0  MCV 102.1*  --   --   --  95.1  --   PLT 243  --   --   --  141*  --     Basic Metabolic Panel: Recent Labs  Lab 10/31/22 2110 10/31/22 2158 10/31/22 2210 11/01/22 0158 11/01/22 0342 11/01/22 0606  NA 139 138 140 137 135 135  K 4.2 4.1 4.2 3.3* 3.3* 3.3*  CL 96*  --   --   --  104  --   CO2 11*  --   --   --  19*  --   GLUCOSE 222*  --   --   --  93  --   BUN 14  --   --   --  16  --   CREATININE 1.69*  --   --   --  1.00  --   CALCIUM 9.3  --   --   --  7.8*  --   MG  --   --   --   --  2.5*  --   PHOS  --   --   --   --  2.7  --    GFR: Estimated Creatinine Clearance: 99.6 mL/min (by C-G formula based on SCr of 1 mg/dL). Recent Labs  Lab 10/31/22 2110 10/31/22 2147 11/01/22 0119 11/01/22 0342  WBC 14.5*  --   --  9.9  LATICACIDVEN  --  7.4* 1.4  --     Liver Function Tests: Recent Labs  Lab 10/31/22 2110  AST 83*  ALT 52*  ALKPHOS 74  BILITOT 0.5  PROT 7.5  ALBUMIN 4.5   No results for input(s): "LIPASE", "AMYLASE" in the last 168 hours. No results for input(s): "AMMONIA" in the last 168 hours.  ABG    Component Value Date/Time   PHART 7.464 (H) 11/01/2022 0606   PCO2ART 28.6 (L) 11/01/2022 0606   PO2ART 143 (H) 11/01/2022 0606   HCO3 20.5 11/01/2022 0606   TCO2 21 (L) 11/01/2022 0606   ACIDBASEDEF 2.0 11/01/2022 0606   O2SAT 99 11/01/2022 0606     Coagulation Profile: No results for input(s): "INR", "PROTIME" in the last 168 hours.  Cardiac Enzymes: No results for input(s): "CKTOTAL", "CKMB", "CKMBINDEX", "TROPONINI" in the last 168 hours.  HbA1C: Hgb A1c MFr Bld  Date/Time Value Ref Range Status  11/01/2022 03:42 AM 5.3 4.8 - 5.6 % Final    Comment:    (NOTE) Pre diabetes:          5.7%-6.4%  Diabetes:              >6.4%  Glycemic control for   <7.0% adults with diabetes     CBG: No  results for input(s): "GLUCAP" in the last 168 hours.    Critical care time: 28 min     Brett Canales Minor ACNP Acute Care Nurse Practitioner [facility]  Pulmonary/Critical Care Please consult Amion 11/01/2022, 11:19 AM  Attending Attestation: I agree with the Advanced Practitioner's note, impression, and recommendations as outlined. I have taken an independent interval history, reviewed the chart and examined the patient.  My medical decision making is as follows:   Subjective: 27 yo with history of anaplastic oligodendroglioma s/p resection and chemotherapy. Here with status epilepticus.   Objective: Blood pressure (!) 86/55, pulse (!) 58, temperature 99.3 F (37.4 C), temperature source Oral, resp. rate (!) 28, height 5\' 8"  (1.727 m), weight 64 kg, SpO2 100%.  Gen:      Intubated, sedated, acutely ill appearing HEENT:  ETT to vent, LTM leads in place Lungs:    sounds of mechanical ventilation auscultated no wheeze CV:         RRR Abd:      + bowel sounds; soft, non-tender; no palpable masses, no distension Ext:    No edema Skin:      Warm and dry; no rashes Neuro:   sedated, RASS -3, moves all 4 extremities   Labs/Imaging: EEG reviewed - severe diffuse encephalopathy, cortical dyfunction in right parietal occipital region   MRI shows unchanged 6mm acute hemorrhage in the area of resection. Trace subdural hemorrhage.   UDS positive for benzos  Assessment and Plan:  Acute hypoxemic respiratory failure Status epilepticus  Anaplastic oligodendroglioma s/p resection and chmo Hypokalemia Aspiration pna  Continue vent management with lung protective ventilation. Daily SAT/SBT AEDs per neurology.  MRI today shows stable small hemorrhage.  Replete electrolytes, daily bmet  Continue propofol, fentanyl for sedation Continue unasyn for aspiration - will send procalcitonin to see if abx can be discontinued.   The patient is critically ill due to respiratory failure, status  epilepticus with multiple organ systems failure and requires high complexity decision making for assessment and support, frequent evaluation and titration of therapies, application of advanced monitoring technologies and extensive interpretation of multiple databases.   Critical Care Time devoted to patient care services described in this note is 32 minutes. This time reflects time of care of this signee Charlott Holler . This critical care time does not reflect separately billable procedures or procedure time, teaching time and supervisory time of PA/NP/Med student/Med Resident etc but could involve care discussion time.  Mickel Baas Pulmonary and Critical Care Medicine 11/01/2022 5:06 PM  Pager: see AMION  If no response to pager, please call critical care on call (see AMION) until 7pm After 7:00 pm call Elink

## 2022-11-01 NOTE — ED Notes (Deleted)
Room was dirty and getting zapped.  But got report and am ready whenever they bring the patient.

## 2022-11-01 NOTE — Progress Notes (Signed)
eLink Physician-Brief Progress Note Patient Name: Stephen Beard DOB: 04-25-1995 MRN: [id number]   Date of Service  11/01/2022  HPI/Events of Note  BP 87 / 50  eICU Interventions  Peripheral Levophed gtt ordered.        Thomasene Lot Cendy Oconnor 11/01/2022, 10:40 PM

## 2022-11-01 NOTE — ED Notes (Signed)
Joneen Roach, NP with critical care at bedside.

## 2022-11-01 NOTE — Progress Notes (Signed)
Patient was having hypotension 84/59 (67).  NP CCM JD was alerted.  Orders were given for 500cc LR bolus.  Restraint orders were also verbalized.  IF it is a conitnued problem the NP said have night shift call elink and get low dose pressor orders

## 2022-11-01 NOTE — Progress Notes (Addendum)
Subjective: Intubated and sedated on propofol.   Objective: Current vital signs: BP 101/76   Pulse 82   Temp 98.8 F (37.1 C) (Axillary)   Resp (!) 28   Ht 5\' 8"  (1.727 m)   Wt 64 kg   SpO2 99%   BMI 21.44 kg/m  Vital signs in last 24 hours: Temp:  [98.6 F (37 C)-100.7 F (38.2 C)] 98.8 F (37.1 C) (08/20 0645) Pulse Rate:  [82-169] 82 (08/20 0645) Resp:  [21-30] 28 (08/20 0645) BP: (98-153)/(72-110) 101/76 (08/20 0630) SpO2:  [82 %-100 %] 99 % (08/20 0645) FiO2 (%):  [40 %-100 %] 40 % (08/20 0605) Weight:  [64 kg] 64 kg (08/19 2128)  Intake/Output from previous day: 08/19 0701 - 08/20 0700 In: 152.5 [I.V.:8.2; IV Piggyback:144.3] Out: -  Intake/Output this shift: No intake/output data recorded. Nutritional status:  Diet Order             Diet NPO time specified  Diet effective now                  HEENT: [place]/AT Lungs: Intubated Ext: No edema  Neurologic Exam: Ment: Intubated and sedated on propofol gtt. GCS 3t.  CN: Pupils equal and sluggishly reactive. Eyes are conjugate and at the midline. No doll's eye reflex. Face flaccidly symmetric.  Motor/Sensory: Flaccid tone x 4 without movement to stimulation (sedated on propofol) Reflexes: Hypoactive reflexes.  Cerebellar/Gait: Unable to assess  Lab Results: Results for orders placed or performed during the hospital encounter of 10/31/22 (from the past 48 hour(s))  CBC with Differential     Status: Abnormal   Collection Time: 10/31/22  9:10 PM  Result Value Ref Range   WBC 14.5 (H) 4.0 - 10.5 K/uL   RBC 5.23 4.22 - 5.81 MIL/uL   Hemoglobin 17.5 (H) 13.0 - 17.0 g/dL   HCT 16.1 (H) 09.6 - 04.5 %   MCV 102.1 (H) 80.0 - 100.0 fL   MCH 33.5 26.0 - 34.0 pg   MCHC 32.8 30.0 - 36.0 g/dL   RDW 40.9 81.1 - 91.4 %   Platelets 243 150 - 400 K/uL   nRBC 0.2 0.0 - 0.2 %   Neutrophils Relative % 57 %   Neutro Abs 8.3 (H) 1.7 - 7.7 K/uL   Lymphocytes Relative 32 %   Lymphs Abs 4.6 (H) 0.7 - 4.0 K/uL   Monocytes  Relative 4 %   Monocytes Absolute 0.5 0.1 - 1.0 K/uL   Eosinophils Relative 0 %   Eosinophils Absolute 0.1 0.0 - 0.5 K/uL   Basophils Relative 1 %   Basophils Absolute 0.1 0.0 - 0.1 K/uL   Immature Granulocytes 6 %   Abs Immature Granulocytes 0.88 (H) 0.00 - 0.07 K/uL    Comment: Performed at [facility], [address], [place], [place] [address]  Comprehensive metabolic panel     Status: Abnormal   Collection Time: 10/31/22  9:10 PM  Result Value Ref Range   Sodium 139 135 - 145 mmol/L   Potassium 4.2 3.5 - 5.1 mmol/L   Chloride 96 (L) 98 - 111 mmol/L   CO2 11 (L) 22 - 32 mmol/L   Glucose, Bld 222 (H) 70 - 99 mg/dL    Comment: Glucose reference range applies only to samples taken after fasting for at least 8 hours.   BUN 14 6 - 20 mg/dL   Creatinine, Ser 6.21 (H) 0.61 - 1.24 mg/dL   Calcium 9.3 8.9 - 30.8 mg/dL  Total Protein 7.5 6.5 - 8.1 g/dL   Albumin 4.5 3.5 - 5.0 g/dL   AST 83 (H) 15 - 41 U/L   ALT 52 (H) 0 - 44 U/L   Alkaline Phosphatase 74 38 - 126 U/L   Total Bilirubin 0.5 0.3 - 1.2 mg/dL   GFR, Estimated 56 (L) >60 mL/min    Comment: (NOTE) Calculated using the CKD-EPI Creatinine Equation (2021)    Anion gap 32 (H) 5 - 15    Comment: ELECTROLYTES REPEATED TO VERIFY Performed at [facility], [address] [place], [place] [address]   Lactic acid, plasma     Status: Abnormal   Collection Time: 10/31/22  9:47 PM  Result Value Ref Range   Lactic Acid, Venous 7.4 (HH) 0.5 - 1.9 mmol/L    Comment: CRITICAL RESULT CALLED TO, READ BACK BY AND VERIFIED WITH Tommy Rainwater RN 10/31/22 @2223  BY J. WHITE Performed at [facility], [address], [place], [place] [address]   I-Stat venous blood gas, [facility] ED, [facility], [facility])     Status: Abnormal   Collection Time: 10/31/22  9:58 PM  Result Value Ref Range   pH, Ven 7.132 (LL) 7.25 - 7.43   pCO2, Ven 52.0 44 - 60 mmHg   pO2, Ven 142 (H) 32 - 45 mmHg   Bicarbonate 17.4 (L) 20.0 - 28.0 mmol/L   TCO2 19 (L) 22  - 32 mmol/L   O2 Saturation 98 %   Acid-base deficit 12.0 (H) 0.0 - 2.0 mmol/L   Sodium 138 135 - 145 mmol/L   Potassium 4.1 3.5 - 5.1 mmol/L   Calcium, Ion 1.05 (L) 1.15 - 1.40 mmol/L   HCT 49.0 39.0 - 52.0 %   Hemoglobin 16.7 13.0 - 17.0 g/dL   Sample type VENOUS    Comment NOTIFIED PHYSICIAN   I-Stat arterial blood gas, ED     Status: Abnormal   Collection Time: 10/31/22 10:10 PM  Result Value Ref Range   pH, Arterial 7.131 (LL) 7.35 - 7.45   pCO2 arterial 65.3 (HH) 32 - 48 mmHg   pO2, Arterial 107 83 - 108 mmHg   Bicarbonate 21.8 20.0 - 28.0 mmol/L   TCO2 24 22 - 32 mmol/L   O2 Saturation 96 %   Acid-base deficit 9.0 (H) 0.0 - 2.0 mmol/L   Sodium 140 135 - 145 mmol/L   Potassium 4.2 3.5 - 5.1 mmol/L   Calcium, Ion 1.15 1.15 - 1.40 mmol/L   HCT 46.0 39.0 - 52.0 %   Hemoglobin 15.6 13.0 - 17.0 g/dL   Patient temperature 09.8 F    Collection site RADIAL, ALLEN'S TEST ACCEPTABLE    Drawn by RT    Sample type ARTERIAL    Comment NOTIFIED PHYSICIAN   I-Stat CG4 Lactic Acid     Status: None   Collection Time: 11/01/22  1:19 AM  Result Value Ref Range   Lactic Acid, Venous 1.4 0.5 - 1.9 mmol/L  I-Stat arterial blood gas, ED     Status: Abnormal   Collection Time: 11/01/22  1:58 AM  Result Value Ref Range   pH, Arterial 7.385 7.35 - 7.45   pCO2 arterial 35.0 32 - 48 mmHg   pO2, Arterial 294 (H) 83 - 108 mmHg   Bicarbonate 20.7 20.0 - 28.0 mmol/L   TCO2 22 22 - 32 mmol/L   O2 Saturation 100 %   Acid-base deficit 3.0 (H) 0.0 - 2.0 mmol/L   Sodium 137 135 -  145 mmol/L   Potassium 3.3 (L) 3.5 - 5.1 mmol/L   Calcium, Ion 1.07 (L) 1.15 - 1.40 mmol/L   HCT 43.0 39.0 - 52.0 %   Hemoglobin 14.6 13.0 - 17.0 g/dL   Patient temperature 993.7 F    Collection site RADIAL, ALLEN'S TEST ACCEPTABLE    Drawn by RT    Sample type ARTERIAL   CBC     Status: Abnormal   Collection Time: 11/01/22  3:42 AM  Result Value Ref Range   WBC 9.9 4.0 - 10.5 K/uL   RBC 4.73 4.22 - 5.81 MIL/uL    Hemoglobin 16.0 13.0 - 17.0 g/dL   HCT 16.9 67.8 - 93.8 %   MCV 95.1 80.0 - 100.0 fL   MCH 33.8 26.0 - 34.0 pg   MCHC 35.6 30.0 - 36.0 g/dL   RDW 10.1 75.1 - 02.5 %   Platelets 141 (L) 150 - 400 K/uL   nRBC 0.0 0.0 - 0.2 %    Comment: Performed at [facility], [address], [place], [place] [address]  Basic metabolic panel     Status: Abnormal   Collection Time: 11/01/22  3:42 AM  Result Value Ref Range   Sodium 135 135 - 145 mmol/L   Potassium 3.3 (L) 3.5 - 5.1 mmol/L   Chloride 104 98 - 111 mmol/L   CO2 19 (L) 22 - 32 mmol/L   Glucose, Bld 93 70 - 99 mg/dL    Comment: Glucose reference range applies only to samples taken after fasting for at least 8 hours.   BUN 16 6 - 20 mg/dL   Creatinine, Ser 8.24 0.61 - 1.24 mg/dL   Calcium 7.8 (L) 8.9 - 10.3 mg/dL   GFR, Estimated >23 >53 mL/min    Comment: (NOTE) Calculated using the CKD-EPI Creatinine Equation (2021)    Anion gap 12 5 - 15    Comment: Performed at [facility], [address], [place], [place] [address]  Magnesium     Status: Abnormal   Collection Time: 11/01/22  3:42 AM  Result Value Ref Range   Magnesium 2.5 (H) 1.7 - 2.4 mg/dL    Comment: Performed at [facility], [address], [place], [place] [address]  Phosphorus     Status: None   Collection Time: 11/01/22  3:42 AM  Result Value Ref Range   Phosphorus 2.7 2.5 - 4.6 mg/dL    Comment: Performed at [facility], [address] [place], [place] [address]  Hemoglobin A1c     Status: None   Collection Time: 11/01/22  3:42 AM  Result Value Ref Range   Hgb A1c MFr Bld 5.3 4.8 - 5.6 %    Comment: (NOTE) Pre diabetes:          5.7%-6.4%  Diabetes:              >6.4%  Glycemic control for   <7.0% adults with diabetes    Mean Plasma Glucose 105.41 mg/dL    Comment: Performed at [facility], [address], [place], [place] [address]  I-Stat arterial blood gas, ED     Status: Abnormal   Collection Time: 11/01/22  6:06 AM   Result Value Ref Range   pH, Arterial 7.464 (H) 7.35 - 7.45   pCO2 arterial 28.6 (L) 32 - 48 mmHg   pO2, Arterial 143 (H) 83 - 108 mmHg   Bicarbonate 20.5 20.0 - 28.0 mmol/L  TCO2 21 (L) 22 - 32 mmol/L   O2 Saturation 99 %   Acid-base deficit 2.0 0.0 - 2.0 mmol/L   Sodium 135 135 - 145 mmol/L   Potassium 3.3 (L) 3.5 - 5.1 mmol/L   Calcium, Ion 1.11 (L) 1.15 - 1.40 mmol/L   HCT 42.0 39.0 - 52.0 %   Hemoglobin 14.3 13.0 - 17.0 g/dL   Patient temperature 14.7 F    Collection site RADIAL, ALLEN'S TEST ACCEPTABLE    Drawn by RT    Sample type ARTERIAL     No results found for this or any previous visit (from the past 240 hour(s)).  Lipid Panel No results for input(s): "CHOL", "TRIG", "HDL", "CHOLHDL", "VLDL", "LDLCALC" in the last 72 hours.  Studies/Results: CT Head Wo Contrast  Result Date: 10/31/2022 CLINICAL DATA:  Seizure, history of brain tumor EXAM: CT HEAD WITHOUT CONTRAST TECHNIQUE: Contiguous axial images were obtained from the base of the skull through the vertex without intravenous contrast. RADIATION DOSE REDUCTION: This exam was performed according to the departmental dose-optimization program which includes automated exposure control, adjustment of the mA and/or kV according to patient size and/or use of iterative reconstruction technique. COMPARISON:  05/31/2022 FINDINGS: Brain: Redemonstrated sequela of prior right occipital craniotomy with subjacent resection cavity and postoperative encephalomalacia. New hyperdense focus along the anterior aspect of the resection cavity, which measures 6 x 5 x 5 mm (AP x TR x CC) (series 3, image 21 and series 5, image 23). No evidence of acute infarct, mass effect, or midline shift. Redemonstrated ex vacuo dilatation of the right occipital and temporal horns. No new hydrocephalus. No extra-axial collection. Vascular: No hyperdense vessel. Skull: Prior right occipital craniotomy. Negative for fracture or focal lesion. Sinuses/Orbits: No  acute finding. Other: The mastoids are well aerated. IMPRESSION: New 6 x 5 x 5 mm hyperdense focus along the anterior aspect of the right occipital resection cavity, concerning for recurrent tumor versus small focus of hemorrhage. MRI with and without contrast is recommended for further evaluation. These results were called by telephone at the time of interpretation on 10/31/2022 at 11:27 pm to provider Rancor , who verbally acknowledged these results. Electronically Signed   By: Wiliam Ke M.D.   On: 10/31/2022 23:31   DG Chest Portable 1 View  Result Date: 10/31/2022 CLINICAL DATA:  Post intubation.  Patient brought in due to seizure. EXAM: PORTABLE CHEST 1 VIEW COMPARISON:  Radiograph 03/23/2022 FINDINGS: Subdiaphragmatic enteric tube. Endotracheal tube tip in the intrathoracic trachea 5.0 cm from the carina. No focal consolidation, pleural effusion, or pneumothorax. No displaced rib fractures. Normal cardiomediastinal silhouette. IMPRESSION: Endotracheal tube tip in the intrathoracic trachea 5.0 cm from the carina Electronically Signed   By: Minerva Fester M.D.   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 He was emergently intubated for airway protection. He received Keppra IV 3000 mg and was started on propofol gtt.  - Exam is uninformative in the context of intubation with sedation on propofol. No clinical seizure activity seen.  - Bedside review of LTM EEG shows low amplitude tracings without electrographic seizure - CT head: New 6 x 5 x 5 mm hyperdense focus along the anterior aspect of the right occipital resection cavity, concerning for recurrent tumor versus small focus of hemorrhage.  - Impression: Breakthrough seizures and status epilepticus. The patient's mother states that he has been compliant with his home lamotrigine regimen. Possible underlying etiologies for breakthrough seizure includes tumor progression versus underlying infection versus subtherapeutic lamotrigine level. Of note, he does have a history of AED noncompliance and drug abuse (methamphetamine).  - LTM EEG report for this morning: Continuous slowing, generalized and maximal in the right parieto-occipital region. This study was initially suggestive of severe diffuse encephalopathy likely related to sedation.  Gradually sedation was adjusted, EEG improved.  Subsequently EEG was suggestive of cortical dysfunction in right parietal occipital region likely secondary to underlying structural abnormality.  No seizures or definite epileptiform discharges were seen during the study.    Recommendations: -Continue home lamotrigine of 250 mg twice daily. Administering per tube -Lamotrigine level (ordered) -Continue Keppra IV 750 mg BID for now as a temporizing measure. Will not be able to continue long-term due to prior side effects experienced by the patient of sedation, incoordination and left sided numbness.  -Dr. Liana Gerold (Neurooncology) outpatient clinic note documents his thoughts on Vimpat being a good choice for a second AED, if needed.  -Inpatient seizure precautions. -Per mother, he  does not drive due to seizures and left visual field cut secondary to right occipital lobe damage from prior tumor resection with irradiation.  -MRI w+w/o contrast at some point today. He has MRI-safe EEG leads.  30 minutes spent in the neurological evaluation and management of this critically ill patient   LOS: 0 days   @Electronically  signed: Dr. Caryl Pina 11/01/2022  7:40 AM

## 2022-11-01 NOTE — ED Notes (Signed)
Received callback from critical care provider. Foley catheter to be ordered.

## 2022-11-01 NOTE — H&P (Addendum)
NAME:  Stephen Beard, MRN:  [id number], DOB:  07-Apr-1995, LOS: 0 ADMISSION DATE:  10/31/2022, CONSULTATION DATE: 8/20 REFERRING MD:  Dr Suezanne Jacquet, CHIEF COMPLAINT: Status epilepticus  History of Present Illness:  27 year old male with past medical history as below, which is significant for anaplastic oligodendroglioma s/p resection and chemotherapy.  He has had seizures since that time which have not been managed with lamotrigine.  In the evening hours of 8/20 he suffered a seizure at home which went on for 15 minutes before the arrival of EMS.  He was treated with benzodiazepines by EMS without improvement.  He continued to seize upon arrival to the emergency department he was treated with additional benzodiazepines and IV Keppra bolus.  Duration of seizure activity estimated at 30 minutes.  Seizures did then eventually break, but the patient was unresponsive and desaturating requiring intubation in the emergency department.  He was evaluated by neurology in the emergency department who recommended additional Keppra, EEG, and MRI.  PCCM was asked to evaluate the patient for admission due to intubated status.  Pertinent  Medical History   has a past medical history of ADD (attention deficit disorder), ADHD, Bifrontal oligodendroglioma (HCC), Headache, Psychotic disorder (HCC), and Seizures (HCC).   Significant Hospital Events: Including procedures, antibiotic start and stop dates in addition to other pertinent events     Interim History / Subjective:    Objective   Blood pressure 106/77, pulse (!) 103, temperature (!) 100.7 F (38.2 C), temperature source Axillary, resp. rate (!) 30, height 5\' 8"  (1.727 m), weight 64 kg, SpO2 98%.    Vent Mode: PRVC FiO2 (%):  [100 %] 100 % Set Rate:  [22 bmp-30 bmp] 30 bmp Vt Set:  [550 mL] 550 mL PEEP:  [10 cmH20] 10 cmH20 Plateau Pressure:  [25 cmH20-28 cmH20] 25 cmH20   Intake/Output Summary (Last 24 hours) at 11/01/2022 0126 Last data filed at  10/31/2022 2243 Gross per 24 hour  Intake 152.47 ml  Output --  Net 152.47 ml   Filed Weights   10/31/22 2128  Weight: 64 kg    Examination: General: Young adult male of normal body habitus on mechanical ventilator HENT: Normocephalic, atraumatic, pupils pinpoint Lungs: Clear bilateral breath sounds Cardiovascular: Regular rate and rhythm, no murmurs rubs gallops.  No edema Abdomen: Soft, nondistended Extremities: No acute deformity Neuro: Sedated RASS of -4   Resolved Hospital Problem list     Assessment & Plan:   Status epilepticus Hx anaplastic oligodendroglioma s/p resection and chemotherapy CT stable per neurology read - admit to ICU - Keppra - Propofol - EEG ongoing - Appreciate neurology - MRI pending  Acute respiratory failure with hypoxia and hypercarbia: - Full vent support - VAP bundle - ABG reviewed and settings adjusted - Repeat ABG now - SBT/SAT when meets criteria.  - Prop/fent for RASS goal -1  AKI:  - Trend BMP and UOP  Hyperglycemia with no history of DM: - Trend glucose  Elevated transaminases: - trend  Lactic acidosis: cleared - Supportive care  Leukocytosis: likely reactive to the seizure, cannot rule out aspiration - Unasyn - Trend WBC and fever curve.  Best Practice (right click and "Reselect all SmartList Selections" daily)   Diet/type: NPO DVT prophylaxis: prophylactic heparin  GI prophylaxis: H2B Lines: N/A Foley:  Yes, and it is still needed Code Status:  full code Last date of multidisciplinary goals of care discussion [   ]  Labs   CBC: Recent Labs  Lab 10/31/22 2110  10/31/22 2158 10/31/22 2210  WBC 14.5*  --   --   NEUTROABS 8.3*  --   --   HGB 17.5* 16.7 15.6  HCT 53.4* 49.0 46.0  MCV 102.1*  --   --   PLT 243  --   --     Basic Metabolic Panel: Recent Labs  Lab 10/31/22 2110 10/31/22 2158 10/31/22 2210  NA 139 138 140  K 4.2 4.1 4.2  CL 96*  --   --   CO2 11*  --   --   GLUCOSE 222*  --   --    BUN 14  --   --   CREATININE 1.69*  --   --   CALCIUM 9.3  --   --    GFR: Estimated Creatinine Clearance: 58.9 mL/min (A) (by C-G formula based on SCr of 1.69 mg/dL (H)). Recent Labs  Lab 10/31/22 2110 10/31/22 2147 11/01/22 0119  WBC 14.5*  --   --   LATICACIDVEN  --  7.4* 1.4    Liver Function Tests: Recent Labs  Lab 10/31/22 2110  AST 83*  ALT 52*  ALKPHOS 74  BILITOT 0.5  PROT 7.5  ALBUMIN 4.5   No results for input(s): "LIPASE", "AMYLASE" in the last 168 hours. No results for input(s): "AMMONIA" in the last 168 hours.  ABG    Component Value Date/Time   PHART 7.131 (LL) 10/31/2022 2210   PCO2ART 65.3 (HH) 10/31/2022 2210   PO2ART 107 10/31/2022 2210   HCO3 21.8 10/31/2022 2210   TCO2 24 10/31/2022 2210   ACIDBASEDEF 9.0 (H) 10/31/2022 2210   O2SAT 96 10/31/2022 2210     Coagulation Profile: No results for input(s): "INR", "PROTIME" in the last 168 hours.  Cardiac Enzymes: No results for input(s): "CKTOTAL", "CKMB", "CKMBINDEX", "TROPONINI" in the last 168 hours.  HbA1C: No results found for: "HGBA1C"  CBG: No results for input(s): "GLUCAP" in the last 168 hours.  Review of Systems:   Patient is encephalopathic and/or intubated; therefore, history has been obtained from chart review.    Past Medical History:  He,  has a past medical history of ADD (attention deficit disorder), ADHD, Bifrontal oligodendroglioma (HCC), Headache, Psychotic disorder (HCC), and Seizures (HCC).   Surgical History:   Past Surgical History:  Procedure Laterality Date   APPLICATION OF CRANIAL NAVIGATION N/A 07/02/2019   Procedure: APPLICATION OF CRANIAL NAVIGATION;  Surgeon: Jadene Pierini, MD;  Location: [facility] OR;  Service: Neurosurgery;  Laterality: N/A;   CRANIOTOMY N/A 07/03/2019   Procedure: CRANIOTOMY HEMATOMA EVACUATION SUBDURAL;  Surgeon: Jadene Pierini, MD;  Location: [facility] OR;  Service: Neurosurgery;  Laterality: N/A;   CRANIOTOMY Right 07/03/2019    Procedure: CRANIOTOMY HEMATOMA EVACUATION SUBDURAL;  Surgeon: Jadene Pierini, MD;  Location: [facility] OR;  Service: Neurosurgery;  Laterality: Right;   CRANIOTOMY Right 07/02/2019   Procedure: CRANIOTOMY TUMOR EXCISION with Normajean Glasgow;  Surgeon: Jadene Pierini, MD;  Location: [facility] OR;  Service: Neurosurgery;  Laterality: Right;  posterior     Social History:   reports that he has been smoking. He has a 12 pack-year smoking history. His smokeless tobacco use includes chew. He reports current alcohol use. He reports current drug use. Drug: Cocaine.   Family History:  His family history is not on file.   Allergies No Known Allergies   Home Medications  Prior to Admission medications   Medication Sig Start Date End Date Taking? Authorizing Provider  acetaminophen (TYLENOL) 500 MG tablet Take 500  mg by mouth every 6 (six) hours as needed for moderate pain.    [provider]  ibuprofen (ADVIL) 200 MG tablet Take 200 mg by mouth every 6 (six) hours as needed for headache (pain).    [provider]  lamoTRIgine (LAMICTAL) 100 MG tablet TAKE 1 TABLET BY MOUTH TWICE DAILY.TAKE ALONG WITH ONE 150 MG TABLET (250 MG TWICE DAILY TOTAL) 10/06/22   Henreitta Leber, MD  lamoTRIgine (LAMICTAL) 150 MG tablet TAKE 1 TABLET BY MOUTH TWICE DAILY.TAKE WITH 100 MG TABLET.(250 MG TWICE DAILY TOTAL). 08/29/22   Henreitta Leber, MD  levETIRAcetam (KEPPRA) 500 MG tablet Take 1 tablet (500 mg total) by mouth 2 (two) times daily. 06/01/22   Theron Arista, PA-C  traZODone (DESYREL) 100 MG tablet TAKE 1 TABLET BY MOUTH AT BEDTIME AS NEEDED FOR SLEEP Patient taking differently: Take 100 mg by mouth at bedtime as needed for sleep. 11/01/21   Henreitta Leber, MD     Critical care time: 48 min     Joneen Roach, AGACNP-BC [facility]  See Amion for personal pager PCCM on call pager [phone] until 7pm. Please call Elink 7p-7a. [phone]  11/01/2022 1:59  AM

## 2022-11-01 NOTE — Procedures (Signed)
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 EEG to evaluate for seizure  Level of alertness:  comatose-->awake, asleep  AEDs during EEG study: LEV, LTG, propofol  Technical aspects: This EEG study was done with scalp electrodes positioned according to the 10-20 International system of electrode placement. Electrical activity was reviewed with band pass filter of 1-70Hz , sensitivity of 7 uV/mm, display speed of 43mm/sec with a 60Hz  notched filter applied as appropriate. EEG data were recorded continuously and digitally stored.  Video monitoring was available and reviewed as appropriate.  Description: At the study, EEG showed continuous generalized 3 to 5 Hz 11 delta slowing with overriding 12 to 15 Hz beta activity.  Gradually as sedation was adjusted, EEG showed posterior dominant rhythm of 8-9 Hz activity of moderate voltage (25-35 uV) seen predominantly in posterior head regions, symmetric and reactive to eye opening and eye closing. Sleep was characterized by vertex waves, sleep spindles (12 to 14 Hz), maximal frontocentral region. Continuous 3 to 5 Hz theta-delta slowing was also noted in right parieto-occipital region. Hyperventilation and photic stimulation were not performed.     ABNORMALITY - Continuous slow, generalized and maximal right parieto-occipital region  IMPRESSION: This study was initially suggestive of severe diffuse encephalopathy likely related to sedation.  Gradually sedation was adjusted, EEG  improved.  Subsequently EEG was suggestive of cortical dysfunction in right parietal occipital region likely secondary to underlying structural abnormality.  No seizures or definite epileptiform discharges were seen during the study.  Kalah Pflum Annabelle Harman

## 2022-11-01 NOTE — Progress Notes (Signed)
eLink RN called to inform of soft pressures refractory to 1800 hour 500 cc bolus and down titration of sedation. Awaiting orders.

## 2022-11-02 DIAGNOSIS — G40901 Epilepsy, unspecified, not intractable, with status epilepticus: Secondary | ICD-10-CM | POA: Diagnosis not present

## 2022-11-02 LAB — LAMOTRIGINE LEVEL: Lamotrigine Lvl: <1 ug/mL — ABNORMAL LOW (ref 2.0–20.0)

## 2022-11-02 LAB — CBC
HCT: 38.3 % — ABNORMAL LOW (ref 39.0–52.0)
Hemoglobin: 13.4 g/dL (ref 13.0–17.0)
MCH: 34.6 pg — ABNORMAL HIGH (ref 26.0–34.0)
MCHC: 35 g/dL (ref 30.0–36.0)
MCV: 99 fL (ref 80.0–100.0)
Platelets: 117 10*3/uL — ABNORMAL LOW (ref 150–400)
RBC: 3.87 MIL/uL — ABNORMAL LOW (ref 4.22–5.81)
RDW: 12.2 % (ref 11.5–15.5)
WBC: 6.9 10*3/uL (ref 4.0–10.5)
nRBC: 0 % (ref 0.0–0.2)

## 2022-11-02 LAB — PHOSPHORUS
Phosphorus: 2.1 mg/dL — ABNORMAL LOW (ref 2.5–4.6)
Phosphorus: 2.1 mg/dL — ABNORMAL LOW (ref 2.5–4.6)

## 2022-11-02 LAB — BASIC METABOLIC PANEL
Anion gap: 12 (ref 5–15)
BUN: 8 mg/dL (ref 6–20)
CO2: 16 mmol/L — ABNORMAL LOW (ref 22–32)
Calcium: 7.5 mg/dL — ABNORMAL LOW (ref 8.9–10.3)
Chloride: 106 mmol/L (ref 98–111)
Creatinine, Ser: 1.12 mg/dL (ref 0.61–1.24)
GFR, Estimated: >60 mL/min (ref >60)
Glucose, Bld: 76 mg/dL (ref 70–99)
Potassium: 3.7 mmol/L (ref 3.5–5.1)
Sodium: 134 mmol/L — ABNORMAL LOW (ref 135–145)

## 2022-11-02 LAB — PROCALCITONIN: Procalcitonin: 8.56 ng/mL

## 2022-11-02 LAB — MRSA NEXT GEN BY PCR, NASAL: MRSA by PCR Next Gen: NOT DETECTED

## 2022-11-02 LAB — MAGNESIUM
Magnesium: 1.9 mg/dL (ref 1.7–2.4)
Magnesium: 2 mg/dL (ref 1.7–2.4)

## 2022-11-02 LAB — GLUCOSE, CAPILLARY
Glucose-Capillary: 83 mg/dL (ref 70–99)
Glucose-Capillary: 93 mg/dL (ref 70–99)
Glucose-Capillary: 112 mg/dL — ABNORMAL HIGH (ref 70–99)

## 2022-11-02 LAB — LEVETIRACETAM LEVEL: Levetiracetam Lvl: <2 ug/mL — ABNORMAL LOW (ref 10.0–40.0)

## 2022-11-02 MED ORDER — CALCIUM GLUCONATE-NACL 2-0.675 GM/100ML-% IV SOLN
2.0000 g | Freq: Once | INTRAVENOUS | Status: AC
  Administered 2022-11-02: 2000 mg via INTRAVENOUS
  Filled 2022-11-02: qty 100

## 2022-11-02 MED ORDER — LACTATED RINGERS IV BOLUS
1000.0000 mL | Freq: Once | INTRAVENOUS | Status: AC
  Administered 2022-11-02: 1000 mL via INTRAVENOUS

## 2022-11-02 MED ORDER — BETHANECHOL CHLORIDE 10 MG PO TABS
10.0000 mg | ORAL_TABLET | Freq: Three times a day (TID) | ORAL | Status: AC
  Administered 2022-11-02 – 2022-11-04 (×9): 10 mg
  Filled 2022-11-02 (×9): qty 1

## 2022-11-02 MED ORDER — MIDAZOLAM-SODIUM CHLORIDE 100-0.9 MG/100ML-% IV SOLN
20.0000 mg/h | INTRAVENOUS | Status: DC
  Administered 2022-11-02: 20 mg/h via INTRAVENOUS
  Administered 2022-11-02: 10 mg/h via INTRAVENOUS
  Administered 2022-11-02 – 2022-11-03 (×4): 20 mg/h via INTRAVENOUS
  Filled 2022-11-02 (×6): qty 100

## 2022-11-02 MED ORDER — SODIUM CHLORIDE 0.9 % IV SOLN
100.0000 mg | Freq: Two times a day (BID) | INTRAVENOUS | Status: DC
  Administered 2022-11-02 – 2022-11-05 (×8): 100 mg via INTRAVENOUS
  Filled 2022-11-02 (×10): qty 10

## 2022-11-02 MED ORDER — OSMOLITE 1.5 CAL PO LIQD
1000.0000 mL | ORAL | Status: DC
  Administered 2022-11-02 – 2022-11-03 (×3): 1000 mL

## 2022-11-02 NOTE — Progress Notes (Signed)
 Maint done, no skin breakdown

## 2022-11-02 NOTE — Procedures (Addendum)
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 EEG to evaluate for seizure   Level of alertness: awake, asleep-->comatose   AEDs during EEG study: LEV, LTG, propofol, versed   Technical aspects: This EEG study was done with scalp electrodes positioned according to the 10-20 International system of electrode placement. Electrical activity was reviewed with band pass filter of 1-70Hz , sensitivity of 7 uV/mm, display speed of 63mm/sec with a 60Hz  notched filter applied as appropriate. EEG data were recorded continuously and digitally stored.  Video monitoring was available and reviewed as appropriate.   Description: EEG initially showed posterior dominant rhythm of 8-9 Hz activity of moderate voltage (25-35 uV) seen predominantly in posterior head regions, symmetric and reactive to eye opening and eye closing. Sleep was characterized by vertex waves, sleep spindles (12 to 14 Hz), maximal frontocentral region. Continuous 3 to 5 Hz theta-delta slowing was also noted in right parieto-occipital region which gradually improved to intermittent 5-6Hz  theta slowing.  After around 0800 on 11/02/2022, EEG worsened and showed near continuous generalized 6 to 7 Hz theta slowing admixed with 1 to 2 seconds of generalized EEG attenuation.  Hyperventilation and photic stimulation were not performed.     Event button was pressed on 11/02/2022 at 0155, 0159 and 0208 .  Patient was noted to be stiffening with eyes deviated upwards while getting  bath per staff at bedside. Concomitant EEG before, during and after event showed significant myogenic artifact. However after filtering for artifact, no EEG change was noted to suggest seizure.   ABNORMALITY - Continuous slow, generalized and maximal right parieto-occipital region   IMPRESSION: This study was suggestive of cortical dysfunction in right parieto- occipital region likely secondary to underlying structural abnormality.  After around 0800 on 11/02/2022, EEG was also suggestive of moderate to severe diffuse encephalopathy, likely related to sedation.  No seizures or definite epileptiform discharges were seen during the study.  Event button was pressed on  11/02/2022 at 0155, 0159 and 0208 during which patient was noted to be stiffening with eyes deviated upwards while getting bath per staff at bedside without concomitant EEG change.  This was most likely not an epileptic event.  Ovella Manygoats Annabelle Harman

## 2022-11-02 NOTE — Progress Notes (Signed)
   11/02/22 0200  Seizure Activity  Deviation Eyes left  Motor Component Stiff;Generalized  Duration 2 mins for 3 occurences  Interventions Rapid EEG on;Notified MD/Provider;Suction;Airway support;Head Protection;Medication;Oxygen  Tasking (Response) Unable to respond (intubated)   Upon turning patient during bed bath, patient began having generalized seizure with symptomology of generalized stiffness and eye deviation up and to the left. EEG button pressed with [facility] response x3 and updates on symptoms and duration. Neuro MD, Wilford Corner, notified of patient's sz activity and was instructed to continue the course. 50 mcg bolus of fentanyl given and propofol gtt rate increased after sz activity.

## 2022-11-02 NOTE — Progress Notes (Signed)
Initial Nutrition Assessment  DOCUMENTATION CODES:   Not applicable  INTERVENTION:   Initiate tube feeding via OG tube: Osmolite 1.5 at 30 ml/h and increase by 10 ml every 4 hours to goal rate of 60 ml/hr (1440 ml per day)  Provides 2160 kcal, 90 gm protein, 1094 ml free water daily   NUTRITION DIAGNOSIS:   Inadequate oral intake related to inability to eat as evidenced by NPO status.  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    Pt discussed during ICU rounds and with RN.  Pt intubated on vent support. No family at bedside.   Medications reviewed and include: pepcid Fentanyl  Keppra Versed Levophed @ 2 mcg Propofol off this am  Labs reviewed:  Na 134   18 F OG tube; tip in the stomach per xray  NUTRITION - FOCUSED PHYSICAL EXAM:  Flowsheet Row Most Recent Value  Orbital Region No depletion  Upper Arm Region Moderate depletion  Thoracic and Lumbar Region No depletion  Buccal Region Unable to assess  Temple Region No depletion  Clavicle Bone Region No depletion  Clavicle and Acromion Bone Region No depletion  Scapular Bone Region Unable to assess  Dorsal Hand Unable to assess  Patellar Region No depletion  Anterior Thigh Region No depletion  Posterior Calf Region No depletion  Edema (RD Assessment) None  Hair Reviewed  Eyes Unable to assess  Mouth Unable to assess  Skin Reviewed  Nails Unable to assess       Diet Order:   Diet Order             Diet NPO time specified  Diet effective now                   EDUCATION NEEDS:   Not appropriate for education at this time  Skin:  Skin Assessment: Reviewed RN Assessment  Last BM:  unknown, abd  soft  Height:   Ht Readings from Last 1 Encounters:  10/31/22 5\' 8"  (1.727 m)    Weight:   Wt Readings from Last 1 Encounters:  11/02/22 62.3 kg    BMI:  Body mass index is 20.88 kg/m.  Estimated Nutritional Needs:   Kcal:  2000-2300  Protein:  85-95 grams  Fluid:  >2 L/day  Cammy Copa., RD, LDN, CNSC See AMiON for contact information

## 2022-11-02 NOTE — Progress Notes (Signed)
NAME:  Stephen Beard, MRN:  [id number], DOB:  04/13/1995, LOS: 1 ADMISSION DATE:  10/31/2022, CONSULTATION DATE: 8/20 REFERRING MD:  Dr Suezanne Jacquet, CHIEF COMPLAINT: Status epilepticus  History of Present Illness:  27 year old male with past medical history as below, which is significant for anaplastic oligodendroglioma s/p resection and chemotherapy.  He has had seizures since that time which have not been managed with lamotrigine.  In the evening hours of 8/20 he suffered a seizure at home which went on for 15 minutes before the arrival of EMS.  He was treated with benzodiazepines by EMS without improvement.  He continued to seize upon arrival to the emergency department he was treated with additional benzodiazepines and IV Keppra bolus.  Duration of seizure activity estimated at 30 minutes.  Seizures did then eventually break, but the patient was unresponsive and desaturating requiring intubation in the emergency department.  He was evaluated by neurology in the emergency department who recommended additional Keppra, EEG, and MRI.  PCCM was asked to evaluate the patient for admission due to intubated status.  Pertinent  Medical History   has a past medical history of ADD (attention deficit disorder), ADHD, Bifrontal oligodendroglioma (HCC), Headache, Psychotic disorder (HCC), and Seizures (HCC).   Significant Hospital Events: Including procedures, antibiotic start and stop dates in addition to other pertinent events     Interim History / Subjective:  Bradycardia on propofol. Had  sezure like activity overnight when propofol was lightened.   Objective   Blood pressure 95/65, pulse (!) 50, temperature 99 F (37.2 C), temperature source Oral, resp. rate (!) 28, height 5\' 8"  (1.727 m), weight 62.3 kg, SpO2 100%.    Vent Mode: PRVC FiO2 (%):  [40 %] 40 % Set Rate:  [28 bmp] 28 bmp Vt Set:  [550 mL] 550 mL PEEP:  [5 cmH20-8 cmH20] 5 cmH20 Plateau Pressure:  [19 cmH20-23 cmH20] 19 cmH20    Intake/Output Summary (Last 24 hours) at 11/02/2022 0849 Last data filed at 11/02/2022 0600 Gross per 24 hour  Intake 3119.22 ml  Output 1455 ml  Net 1664.22 ml   Filed Weights   10/31/22 2128 11/02/22 0500  Weight: 64 kg 62.3 kg    Examination: Gen:      Intubated, sedated, acutely ill appearing HEENT:  ETT to vent, LTM in palce Lungs:    sounds of mechanical ventilation auscultated no wheeze CV:         RRR Abd:      + bowel sounds; soft, non-tender; no palpable masses, no distension Ext:    No edema Skin:      Warm and dry; no rashes Neuro:   sedated, RASS -4   Resolved Hospital Problem list     Assessment & Plan:   Status epilepticus Hx anaplastic oligodendroglioma s/p resection and chemotherapy Admit to neuro ICU Continue antiepileptics - will d/c propofol and switch to versed given bradycardia.  Sedation Appreciate neurology's input MRI brain shows unchanged 6mm acute hemorrhage, no evidence of tumor recurrence, trace SDH  Acute respiratory failure with hypoxia and hypercarbia: Maintain lung protective ventilation Not appropriate for SAT at this time due to ongoing seizures.   AKI - 1.69 on admission, now resolved - Avoid nephrotoxins  Transaminitis - likely in the setting of seizures  Possible aspiration pna - on unasyn - procalcitonin sent, if low will d/c abx. If elevated will do empiric 5 days   Best Practice (right click and "Reselect all SmartList Selections" daily)   Diet/type: NPO will  start tube feeds DVT prophylaxis: prophylactic heparin  GI prophylaxis: H2B Lines: N/A Foley:  Yes, and it is still needed will start retention meds Code Status:  full code Last date of multidisciplinary goals of care discussion [ 8/21 mom updated at bedside  ]  Critical care time:    The patient is critically ill due to status epilepticus, respiratory failure.  Critical care was necessary to treat or prevent imminent or life-threatening deterioration.   Critical care was time spent personally by me on the following activities: development of treatment plan with patient and/or surrogate as well as nursing, discussions with consultants, evaluation of patient's response to treatment, examination of patient, obtaining history from patient or surrogate, ordering and performing treatments and interventions, ordering and review of laboratory studies, ordering and review of radiographic studies, pulse oximetry, re-evaluation of patient's condition and participation in multidisciplinary rounds.   Critical Care Time devoted to patient care services described in this note is 35 minutes. This time reflects time of care of this signee Charlott Holler . This critical care time does not reflect separately billable procedures or procedure time, teaching time or supervisory time of PA/NP/Med student/Med Resident etc but could involve care discussion time.       Charlott Holler [facility] 11/02/2022 8:50 AM  Pager: see AMION  If no response to pager , please call critical care on call (see AMION) until 7pm After 7:00 pm call Elink

## 2022-11-02 NOTE — Progress Notes (Signed)
eLink Physician-Brief Progress Note Patient Name: Stephen Beard DOB: 1995-04-18 MRN: [id number]   Date of Service  11/02/2022  HPI/Events of Note  Patient is intubated and needs restraints renewed to prevent self-extubation.  eICU Interventions  Restraints renewed.        Migdalia Dk 11/02/2022, 9:52 PM

## 2022-11-02 NOTE — Progress Notes (Addendum)
Subjective: Continues to be intubated. Sedated with propofol gtt at a rate of 50.   Objective: Current vital signs: BP 104/73   Pulse (!) 47   Temp 99 F (37.2 C) (Oral)   Resp (!) 28   Ht 5\' 8"  (1.727 m)   Wt 62.3 kg   SpO2 100%   BMI 20.88 kg/m  Vital signs in last 24 hours: Temp:  [97.5 F (36.4 C)-99.8 F (37.7 C)] 99 F (37.2 C) (08/21 0800) Pulse Rate:  [47-100] 47 (08/21 0900) Resp:  [15-28] 28 (08/21 0900) BP: (84-124)/(53-96) 104/73 (08/21 0900) SpO2:  [96 %-100 %] 100 % (08/21 0900) FiO2 (%):  [40 %] 40 % (08/21 0801) Weight:  [62.3 kg] 62.3 kg (08/21 0500)  Intake/Output from previous day: 08/20 0701 - 08/21 0700 In: 3119.2 [I.V.:1994.1; IV Piggyback:1125.2] Out: 1455 [Urine:1455] Intake/Output this shift: No intake/output data recorded. Nutritional status:  Diet Order             Diet NPO time specified  Diet effective now                  HEENT: [place]/AT Lungs: Intubated Ext: No edema   Neurologic Exam: Ment: Intubated and sedated on propofol gtt at a rate of 50. Minimal movement of lips and head to stimulation. GCS 3t.  CN: Pupils equal and unreactive (on propofol sedation). Eyes are conjugate and at the midline. No doll's eye reflex. Face flaccidly symmetric.  Motor/Sensory: Flaccid tone x 4 without movement to stimulation (sedated on propofol) Reflexes: Hypoactive reflexes.  Cerebellar/Gait: Unable to assess   Lab Results: Results for orders placed or performed during the hospital encounter of 10/31/22 (from the past 48 hour(s))  CBC with Differential     Status: Abnormal   Collection Time: 10/31/22  9:10 PM  Result Value Ref Range   WBC 14.5 (H) 4.0 - 10.5 K/uL   RBC 5.23 4.22 - 5.81 MIL/uL   Hemoglobin 17.5 (H) 13.0 - 17.0 g/dL   HCT 60.4 (H) 54.0 - 98.1 %   MCV 102.1 (H) 80.0 - 100.0 fL   MCH 33.5 26.0 - 34.0 pg   MCHC 32.8 30.0 - 36.0 g/dL   RDW 19.1 47.8 - 29.5 %   Platelets 243 150 - 400 K/uL   nRBC 0.2 0.0 - 0.2 %    Neutrophils Relative % 57 %   Neutro Abs 8.3 (H) 1.7 - 7.7 K/uL   Lymphocytes Relative 32 %   Lymphs Abs 4.6 (H) 0.7 - 4.0 K/uL   Monocytes Relative 4 %   Monocytes Absolute 0.5 0.1 - 1.0 K/uL   Eosinophils Relative 0 %   Eosinophils Absolute 0.1 0.0 - 0.5 K/uL   Basophils Relative 1 %   Basophils Absolute 0.1 0.0 - 0.1 K/uL   Immature Granulocytes 6 %   Abs Immature Granulocytes 0.88 (H) 0.00 - 0.07 K/uL    Comment: Performed at [facility], [address], [place], [place] [address]  Comprehensive metabolic panel     Status: Abnormal   Collection Time: 10/31/22  9:10 PM  Result Value Ref Range   Sodium 139 135 - 145 mmol/L   Potassium 4.2 3.5 - 5.1 mmol/L   Chloride 96 (L) 98 - 111 mmol/L   CO2 11 (L) 22 - 32 mmol/L   Glucose, Bld 222 (H) 70 - 99 mg/dL    Comment: Glucose reference range applies only to samples taken after fasting for at least 8 hours.  BUN 14 6 - 20 mg/dL   Creatinine, Ser 7.51 (H) 0.61 - 1.24 mg/dL   Calcium 9.3 8.9 - 02.5 mg/dL   Total Protein 7.5 6.5 - 8.1 g/dL   Albumin 4.5 3.5 - 5.0 g/dL   AST 83 (H) 15 - 41 U/L   ALT 52 (H) 0 - 44 U/L   Alkaline Phosphatase 74 38 - 126 U/L   Total Bilirubin 0.5 0.3 - 1.2 mg/dL   GFR, Estimated 56 (L) >60 mL/min    Comment: (NOTE) Calculated using the CKD-EPI Creatinine Equation (2021)    Anion gap 32 (H) 5 - 15    Comment: ELECTROLYTES REPEATED TO VERIFY Performed at [facility], [address], [place], [place] [address]   Lactic acid, plasma     Status: Abnormal   Collection Time: 10/31/22  9:47 PM  Result Value Ref Range   Lactic Acid, Venous 7.4 (HH) 0.5 - 1.9 mmol/L    Comment: CRITICAL RESULT CALLED TO, READ BACK BY AND VERIFIED WITH Tommy Rainwater RN 10/31/22 @2223  BY J. WHITE Performed at [facility], [address], [place], [place] [address]   I-Stat venous blood gas, [facility], [facility], [facility])     Status: Abnormal   Collection Time: 10/31/22  9:58 PM  Result Value Ref Range   pH, Ven  7.132 (LL) 7.25 - 7.43   pCO2, Ven 52.0 44 - 60 mmHg   pO2, Ven 142 (H) 32 - 45 mmHg   Bicarbonate 17.4 (L) 20.0 - 28.0 mmol/L   TCO2 19 (L) 22 - 32 mmol/L   O2 Saturation 98 %   Acid-base deficit 12.0 (H) 0.0 - 2.0 mmol/L   Sodium 138 135 - 145 mmol/L   Potassium 4.1 3.5 - 5.1 mmol/L   Calcium, Ion 1.05 (L) 1.15 - 1.40 mmol/L   HCT 49.0 39.0 - 52.0 %   Hemoglobin 16.7 13.0 - 17.0 g/dL   Sample type VENOUS    Comment NOTIFIED PHYSICIAN   I-Stat arterial blood gas, ED     Status: Abnormal   Collection Time: 10/31/22 10:10 PM  Result Value Ref Range   pH, Arterial 7.131 (LL) 7.35 - 7.45   pCO2 arterial 65.3 (HH) 32 - 48 mmHg   pO2, Arterial 107 83 - 108 mmHg   Bicarbonate 21.8 20.0 - 28.0 mmol/L   TCO2 24 22 - 32 mmol/L   O2 Saturation 96 %   Acid-base deficit 9.0 (H) 0.0 - 2.0 mmol/L   Sodium 140 135 - 145 mmol/L   Potassium 4.2 3.5 - 5.1 mmol/L   Calcium, Ion 1.15 1.15 - 1.40 mmol/L   HCT 46.0 39.0 - 52.0 %   Hemoglobin 15.6 13.0 - 17.0 g/dL   Patient temperature 53.6 F    Collection site RADIAL, ALLEN'S TEST ACCEPTABLE    Drawn by RT    Sample type ARTERIAL    Comment NOTIFIED PHYSICIAN   Culture, blood (routine x 2)     Status: None (Preliminary result)   Collection Time: 11/01/22  1:13 AM   Specimen: BLOOD RIGHT HAND  Result Value Ref Range   Specimen Description BLOOD RIGHT HAND    Special Requests      BOTTLES DRAWN AEROBIC AND ANAEROBIC Blood Culture adequate volume   Culture      NO GROWTH 1 DAY Performed at [facility], [address], [place], [place] [address]    Report Status PENDING   Culture, blood (routine x 2)  Status: None (Preliminary result)   Collection Time: 11/01/22  1:13 AM   Specimen: BLOOD LEFT HAND  Result Value Ref Range   Specimen Description BLOOD LEFT HAND    Special Requests      BOTTLES DRAWN AEROBIC AND ANAEROBIC Blood Culture adequate volume   Culture      NO GROWTH 1 DAY Performed at [facility], [address] [place], [place] [address]    Report Status PENDING   I-Stat CG4 Lactic Acid     Status: None   Collection Time: 11/01/22  1:19 AM  Result Value Ref Range   Lactic Acid, Venous 1.4 0.5 - 1.9 mmol/L  I-Stat arterial blood gas, ED     Status: Abnormal   Collection Time: 11/01/22  1:58 AM  Result Value Ref Range   pH, Arterial 7.385 7.35 - 7.45   pCO2 arterial 35.0 32 - 48 mmHg   pO2, Arterial 294 (H) 83 - 108 mmHg   Bicarbonate 20.7 20.0 - 28.0 mmol/L   TCO2 22 22 - 32 mmol/L   O2 Saturation 100 %   Acid-base deficit 3.0 (H) 0.0 - 2.0 mmol/L   Sodium 137 135 - 145 mmol/L   Potassium 3.3 (L) 3.5 - 5.1 mmol/L   Calcium, Ion 1.07 (L) 1.15 - 1.40 mmol/L   HCT 43.0 39.0 - 52.0 %   Hemoglobin 14.6 13.0 - 17.0 g/dL   Patient temperature 413.2 F    Collection site RADIAL, ALLEN'S TEST ACCEPTABLE    Drawn by RT    Sample type ARTERIAL   HIV Antibody (routine testing w rflx)     Status: None   Collection Time: 11/01/22  3:42 AM  Result Value Ref Range   HIV Screen 4th Generation wRfx Non Reactive Non Reactive    Comment: Performed at [facility], [address] [place], [place] [address]  CBC     Status: Abnormal   Collection Time: 11/01/22  3:42 AM  Result Value Ref Range   WBC 9.9 4.0 - 10.5 K/uL   RBC 4.73 4.22 - 5.81 MIL/uL   Hemoglobin 16.0 13.0 - 17.0 g/dL   HCT 27.2 53.6 - 64.4 %   MCV 95.1 80.0 - 100.0 fL   MCH 33.8 26.0 - 34.0 pg   MCHC 35.6 30.0 - 36.0 g/dL   RDW 03.4 74.2 - 59.5 %   Platelets 141 (L) 150 - 400 K/uL   nRBC 0.0 0.0 - 0.2 %    Comment: Performed at [facility], [address], [place], [place] [address]  Basic metabolic panel     Status: Abnormal   Collection Time: 11/01/22  3:42 AM  Result Value Ref Range   Sodium 135 135 - 145 mmol/L   Potassium 3.3 (L) 3.5 - 5.1 mmol/L   Chloride 104 98 - 111 mmol/L   CO2 19 (L) 22 - 32 mmol/L   Glucose, Bld 93 70 - 99 mg/dL    Comment: Glucose reference range applies only to samples taken after  fasting for at least 8 hours.   BUN 16 6 - 20 mg/dL   Creatinine, Ser 6.43 0.61 - 1.24 mg/dL   Calcium 7.8 (L) 8.9 - 10.3 mg/dL   GFR, Estimated >32 >95 mL/min    Comment: (NOTE) Calculated using the CKD-EPI Creatinine Equation (2021)    Anion gap 12 5 - 15    Comment: Performed at [facility], [address], [place], [place]  [address]  Magnesium     Status: Abnormal   Collection Time: 11/01/22  3:42 AM  Result Value Ref Range   Magnesium 2.5 (H) 1.7 - 2.4 mg/dL    Comment: Performed at [facility], [address], [place], [place] [address]  Phosphorus     Status: None   Collection Time: 11/01/22  3:42 AM  Result Value Ref Range   Phosphorus 2.7 2.5 - 4.6 mg/dL    Comment: Performed at [facility], [address], [place], [place] [address]  Hemoglobin A1c     Status: None   Collection Time: 11/01/22  3:42 AM  Result Value Ref Range   Hgb A1c MFr Bld 5.3 4.8 - 5.6 %    Comment: (NOTE) Pre diabetes:          5.7%-6.4%  Diabetes:              >6.4%  Glycemic control for   <7.0% adults with diabetes    Mean Plasma Glucose 105.41 mg/dL    Comment: Performed at [facility], [address] [place], [place] [address]  I-Stat arterial blood gas, ED     Status: Abnormal   Collection Time: 11/01/22  6:06 AM  Result Value Ref Range   pH, Arterial 7.464 (H) 7.35 - 7.45   pCO2 arterial 28.6 (L) 32 - 48 mmHg   pO2, Arterial 143 (H) 83 - 108 mmHg   Bicarbonate 20.5 20.0 - 28.0 mmol/L   TCO2 21 (L) 22 - 32 mmol/L   O2 Saturation 99 %   Acid-base deficit 2.0 0.0 - 2.0 mmol/L   Sodium 135 135 - 145 mmol/L   Potassium 3.3 (L) 3.5 - 5.1 mmol/L   Calcium, Ion 1.11 (L) 1.15 - 1.40 mmol/L   HCT 42.0 39.0 - 52.0 %   Hemoglobin 14.3 13.0 - 17.0 g/dL   Patient temperature 65.7 F    Collection site RADIAL, ALLEN'S TEST ACCEPTABLE    Drawn by RT    Sample type ARTERIAL   Rapid urine drug screen (hospital performed)     Status: Abnormal   Collection Time:  11/01/22  6:40 AM  Result Value Ref Range   Opiates NONE DETECTED NONE DETECTED   Cocaine NONE DETECTED NONE DETECTED   Benzodiazepines POSITIVE (A) NONE DETECTED   Amphetamines NONE DETECTED NONE DETECTED   Tetrahydrocannabinol NONE DETECTED NONE DETECTED   Barbiturates NONE DETECTED NONE DETECTED    Comment: (NOTE) DRUG SCREEN FOR MEDICAL PURPOSES ONLY.  IF CONFIRMATION IS NEEDED FOR ANY PURPOSE, NOTIFY LAB WITHIN 5 DAYS.  LOWEST DETECTABLE LIMITS FOR URINE DRUG SCREEN Drug Class                     Cutoff (ng/mL) Amphetamine and metabolites    1000 Barbiturate and metabolites    200 Benzodiazepine                 200 Opiates and metabolites        300 Cocaine and metabolites        300 THC                            50 Performed at [facility], [address] [place], [place] [address]   Urinalysis, Routine w reflex microscopic -Urine, Clean Catch     Status: Abnormal   Collection Time: 11/01/22  6:41 AM  Result Value  Ref Range   Color, Urine YELLOW YELLOW   APPearance CLOUDY (A) CLEAR   Specific Gravity, Urine 1.020 1.005 - 1.030   pH 5.0 5.0 - 8.0   Glucose, UA NEGATIVE NEGATIVE mg/dL   Hgb urine dipstick SMALL (A) NEGATIVE   Bilirubin Urine NEGATIVE NEGATIVE   Ketones, ur 20 (A) NEGATIVE mg/dL   Protein, ur 161 (A) NEGATIVE mg/dL   Nitrite NEGATIVE NEGATIVE   Leukocytes,Ua NEGATIVE NEGATIVE   RBC / HPF 0-5 0 - 5 RBC/hpf   WBC, UA 0-5 0 - 5 WBC/hpf   Bacteria, UA FEW (A) NONE SEEN   Squamous Epithelial / HPF 0-5 0 - 5 /HPF   Mucus PRESENT    Hyaline Casts, UA PRESENT     Comment: Performed at [facility], [address], [place], [place] [address]  Basic metabolic panel     Status: Abnormal   Collection Time: 11/01/22  4:34 PM  Result Value Ref Range   Sodium 138 135 - 145 mmol/L   Potassium 3.3 (L) 3.5 - 5.1 mmol/L   Chloride 104 98 - 111 mmol/L   CO2 22 22 - 32 mmol/L   Glucose, Bld 79 70 - 99 mg/dL    Comment: Glucose reference range  applies only to samples taken after fasting for at least 8 hours.   BUN 10 6 - 20 mg/dL   Creatinine, Ser 5.40 0.61 - 1.24 mg/dL   Calcium 7.8 (L) 8.9 - 10.3 mg/dL   GFR, Estimated >98 >11 mL/min    Comment: (NOTE) Calculated using the CKD-EPI Creatinine Equation (2021)    Anion gap 12 5 - 15    Comment: Performed at [facility], [address], [place], [place] [address]  Procalcitonin     Status: None   Collection Time: 11/02/22  5:26 AM  Result Value Ref Range   Procalcitonin 8.56 ng/mL    Comment:        Interpretation: PCT > 2 ng/mL: Systemic infection (sepsis) is likely, unless other causes are known. (NOTE)       Sepsis PCT Algorithm           Lower Respiratory Tract                                      Infection PCT Algorithm    ----------------------------     ----------------------------         PCT < 0.25 ng/mL                PCT < 0.10 ng/mL          Strongly encourage             Strongly discourage   discontinuation of antibiotics    initiation of antibiotics    ----------------------------     -----------------------------       PCT 0.25 - 0.50 ng/mL            PCT 0.10 - 0.25 ng/mL               OR       >80% decrease in PCT            Discourage initiation of  antibiotics      Encourage discontinuation           of antibiotics    ----------------------------     -----------------------------         PCT >= 0.50 ng/mL              PCT 0.26 - 0.50 ng/mL               AND       <80% decrease in PCT              Encourage initiation of                                             antibiotics       Encourage continuation           of antibiotics    ----------------------------     -----------------------------        PCT >= 0.50 ng/mL                  PCT > 0.50 ng/mL               AND         increase in PCT                  Strongly encourage                                      initiation of antibiotics     Strongly encourage escalation           of antibiotics                                     -----------------------------                                           PCT <= 0.25 ng/mL                                                 OR                                        > 80% decrease in PCT                                      Discontinue / Do not initiate                                             antibiotics  Performed at [facility], [address] [place], [place] [address]   Basic metabolic panel     Status: Abnormal  Collection Time: 11/02/22  5:26 AM  Result Value Ref Range   Sodium 134 (L) 135 - 145 mmol/L   Potassium 3.7 3.5 - 5.1 mmol/L    Comment: HEMOLYSIS AT THIS LEVEL MAY AFFECT RESULT   Chloride 106 98 - 111 mmol/L   CO2 16 (L) 22 - 32 mmol/L   Glucose, Bld 76 70 - 99 mg/dL    Comment: Glucose reference range applies only to samples taken after fasting for at least 8 hours.   BUN 8 6 - 20 mg/dL   Creatinine, Ser 8.29 0.61 - 1.24 mg/dL   Calcium 7.5 (L) 8.9 - 10.3 mg/dL   GFR, Estimated >56 >21 mL/min    Comment: (NOTE) Calculated using the CKD-EPI Creatinine Equation (2021)    Anion gap 12 5 - 15    Comment: Performed at [facility], [address], [place], [place] [address]  CBC     Status: Abnormal   Collection Time: 11/02/22  5:26 AM  Result Value Ref Range   WBC 6.9 4.0 - 10.5 K/uL   RBC 3.87 (L) 4.22 - 5.81 MIL/uL   Hemoglobin 13.4 13.0 - 17.0 g/dL   HCT 78.4 (L) 69.6 - 29.5 %   MCV 99.0 80.0 - 100.0 fL   MCH 34.6 (H) 26.0 - 34.0 pg   MCHC 35.0 30.0 - 36.0 g/dL   RDW 28.4 13.2 - 44.0 %   Platelets 117 (L) 150 - 400 K/uL   nRBC 0.0 0.0 - 0.2 %    Comment: Performed at [facility], [address], [place], [place] [address]    Recent Results (from the past 240 hour(s))  Culture, blood (routine x 2)     Status: None (Preliminary result)   Collection Time: 11/01/22  1:13 AM   Specimen: BLOOD RIGHT HAND  Result Value Ref  Range Status   Specimen Description BLOOD RIGHT HAND  Final   Special Requests   Final    BOTTLES DRAWN AEROBIC AND ANAEROBIC Blood Culture adequate volume   Culture   Final    NO GROWTH 1 DAY Performed at [facility], [address], [place], [place] [address]    Report Status PENDING  Incomplete  Culture, blood (routine x 2)     Status: None (Preliminary result)   Collection Time: 11/01/22  1:13 AM   Specimen: BLOOD LEFT HAND  Result Value Ref Range Status   Specimen Description BLOOD LEFT HAND  Final   Special Requests   Final    BOTTLES DRAWN AEROBIC AND ANAEROBIC Blood Culture adequate volume   Culture   Final    NO GROWTH 1 DAY Performed at [facility], [address], [place], [place] [address]    Report Status PENDING  Incomplete    Lipid Panel No results for input(s): "CHOL", "TRIG", "HDL", "CHOLHDL", "VLDL", "LDLCALC" in the last 72 hours.  Studies/Results: DG Abd Portable 1V  Result Date: 11/01/2022 CLINICAL DATA:  Nasogastric tube placement.  OG tube placement. EXAM: PORTABLE ABDOMEN - 1 VIEW COMPARISON:  None Available. FINDINGS: Tip and side port of the enteric tube below the diaphragm in the stomach. Nonobstructive upper abdominal bowel gas pattern. IMPRESSION: Tip and side port of the enteric tube below the diaphragm in the stomach. Electronically Signed   By: Narda Rutherford M.D.   On: 11/01/2022 16:49   MR Brain W and Wo Contrast  Result Date: 11/01/2022 CLINICAL DATA:  Seizure, new-onset, no history of  trauma Status epilepticus, status post brain tumor resection, hemorrhage versus mass on CT scan. EXAM: MRI HEAD WITHOUT AND WITH CONTRAST TECHNIQUE: Multiplanar, multiecho pulse sequences of the brain and surrounding structures were obtained without and with intravenous contrast. CONTRAST:  7mL GADAVIST GADOBUTROL 1 MMOL/ML IV SOLN COMPARISON:  Brain MRI 04/19/2022.  Head CT 10/31/2022. FINDINGS: Brain: Unchanged 6 mm focus of acute hemorrhage along  the anterior margin of the right occipital resection cavity. No new or nodular enhancement to suggest local recurrence. Unchanged surrounding T2 hyperintensity. Trace subdural hemorrhage along the right parietal convexity (axial image 27 series 6). No hydrocephalus, mass effect or midline shift. Vascular: Normal flow voids and vessel enhancement. Skull and upper cervical spine: Normal marrow signal and enhancement. Sinuses/Orbits: No acute findings. Other: None. IMPRESSION: 1. Unchanged 6 mm focus of acute hemorrhage along the anterior margin of the right occipital resection cavity. No new or nodular enhancement to suggest local recurrence. 2. Trace subdural hemorrhage along the right parietal convexity. Electronically Signed   By: Orvan Falconer M.D.   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 EEG to evaluate for seizure Level of alertness:  comatose-->awake, asleep AEDs during EEG study: LEV, LTG, propofol Technical aspects: This EEG study was done with scalp electrodes positioned according to the 10-20 International system of electrode placement. Electrical activity was reviewed with band pass filter of 1-70Hz , sensitivity of 7 uV/mm, display speed of 67mm/sec with a 60Hz  notched filter applied as appropriate. EEG data were recorded continuously and digitally stored.  Video monitoring was available and reviewed as  appropriate. Description: At the study, EEG showed continuous generalized 3 to 5 Hz 11 delta slowing with overriding 12 to 15 Hz beta activity.  Gradually as sedation was adjusted, EEG showed posterior dominant rhythm of 8-9 Hz activity of moderate voltage (25-35 uV) seen predominantly in posterior head regions, symmetric and reactive to eye opening and eye closing. Sleep was characterized by vertex waves, sleep spindles (12 to 14 Hz), maximal frontocentral region. Continuous 3 to 5 Hz theta-delta slowing was also noted in right parieto-occipital region. Hyperventilation and photic stimulation were not performed.   ABNORMALITY - Continuous slow, generalized and maximal right parieto-occipital region IMPRESSION: This study was initially suggestive of severe diffuse encephalopathy likely related to sedation.  Gradually sedation was adjusted, EEG improved.  Subsequently EEG was suggestive of cortical dysfunction in right parietal occipital region likely secondary to underlying structural abnormality.  No seizures or definite epileptiform discharges were seen during the study. Charlsie Quest   CT Head Wo Contrast  Result Date: 10/31/2022 CLINICAL DATA:  Seizure, history of brain tumor EXAM: CT HEAD WITHOUT CONTRAST TECHNIQUE: Contiguous axial images were obtained from the base of the skull through the vertex without intravenous contrast. RADIATION DOSE REDUCTION: This exam was performed according to the departmental dose-optimization program which includes automated exposure control, adjustment of the mA and/or kV according to patient size and/or use of iterative reconstruction technique. COMPARISON:  05/31/2022 FINDINGS: Brain: Redemonstrated sequela of prior  right occipital craniotomy with subjacent resection cavity and postoperative encephalomalacia. New hyperdense focus along the anterior aspect of the resection cavity, which measures 6 x 5 x 5 mm (AP x TR x CC) (series 3, image 21 and series 5, image 23). No  evidence of acute infarct, mass effect, or midline shift. Redemonstrated ex vacuo dilatation of the right occipital and temporal horns. No new hydrocephalus. No extra-axial collection. Vascular: No hyperdense vessel. Skull: Prior right occipital craniotomy. Negative for fracture or focal lesion. Sinuses/Orbits: No acute finding. Other: The mastoids are well aerated. IMPRESSION: New 6 x 5 x 5 mm hyperdense focus along the anterior aspect of the right occipital resection cavity, concerning for recurrent tumor versus small focus of hemorrhage. MRI with and without contrast is recommended for further evaluation. These results were called by telephone at the time of interpretation on 10/31/2022 at 11:27 pm to provider Rancor , who verbally acknowledged these results. Electronically Signed   By: Wiliam Ke M.D.   On: 10/31/2022 23:31   DG Chest Portable 1 View  Result Date: 10/31/2022 CLINICAL DATA:  Post intubation.  Patient brought in due to seizure. EXAM: PORTABLE CHEST 1 VIEW COMPARISON:  Radiograph 03/23/2022 FINDINGS: Subdiaphragmatic enteric tube. Endotracheal tube tip in the intrathoracic trachea 5.0 cm from the carina. No focal consolidation, pleural effusion, or pneumothorax. No displaced rib fractures. Normal cardiomediastinal silhouette. IMPRESSION: Endotracheal tube tip in the intrathoracic trachea 5.0 cm from the carina Electronically Signed   By: Minerva Fester M.D.   On: 10/31/2022 22:36    Medications: Scheduled:  bethanechol  10 mg Per Tube TID   Chlorhexidine Gluconate Cloth  6 each Topical Daily   famotidine  20 mg Per Tube BID   heparin  5,000 Units Subcutaneous Q8H   lamoTRIgine  250 mg Per Tube BID   mouth rinse  15 mL Mouth Rinse Q2H   Continuous:  sodium chloride 10 mL/hr at 11/02/22 0600   ampicillin-sulbactam (UNASYN) IV 3 g (11/02/22 0815)   fentaNYL infusion INTRAVENOUS 75 mcg/hr (11/02/22 0600)   levETIRAcetam 750 mg (11/02/22 0751)   midazolam 20 mg/hr (11/02/22 1005)    norepinephrine (LEVOPHED) Adult infusion 2 mcg/min (11/02/22 0600)   propofol (DIPRIVAN) infusion 50 mcg/kg/min (11/02/22 0753)    Assessment: 27 year old male with 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 He was emergently intubated for airway protection. He received Keppra IV 3000 mg and was started on propofol gtt.  - Exam is uninformative in the context of intubation with sedation on propofol. No clinical seizure activity seen.  - CT head: New 6 x 5 x 5 mm hyperdense focus along the anterior aspect of the right occipital resection cavity, concerning for recurrent tumor versus small focus of hemorrhage.  - MRI brain w/wo contrast (8/20): Unchanged 6 mm focus of acute hemorrhage along the anterior margin of the right occipital resection cavity. No new or nodular enhancement to suggest local recurrence. Trace subdural hemorrhage along the right parietal convexity. - LTM EEG report for this morning:  Continuous slow, generalized and maximal right parieto-occipital region. This study was suggestive of cortical dysfunction in right parieto- occipital region likely secondary to underlying structural abnormality.  After around 0800 on 11/02/2022,  EEG was also suggestive of moderate to severe diffuse encephalopathy, likely related to sedation.  No seizures or definite epileptiform discharges were seen during the study. One event was recorded on 11/01/2022 at 0155 during which patient was noted to be stiffening with eyes deviated upwards while getting bath per staff at bedside without concomitant EEG change.  This was  most likely not an epileptic event. - Impression: Breakthrough seizures and status epilepticus. The patient's mother states that he has been compliant with his home lamotrigine regimen. Possible underlying etiologies for breakthrough seizure includes tumor progression versus underlying infection versus subtherapeutic lamotrigine level. Of note, he does have a history of AED noncompliance and drug abuse (methamphetamine).      Recommendations: -Continue home lamotrigine of 250 mg twice daily. Administering per tube -Lamotrigine level (ordered) -Continue Keppra IV 750 mg BID for now as a temporizing measure. Will not be able to continue long-term due to prior side effects experienced by the patient of sedation, incoordination and left sided numbness.  -Dr. Liana Gerold (Neurooncology) outpatient clinic note documents his thoughts on Vimpat being a good choice for a second AED, if needed. Starting Vimpat today (8/21) at 100 mg IV BID. Most likely will discontinue Keppra tomorrow.  -Inpatient seizure precautions. -Per mother, he does not drive due to seizures and left visual field cut secondary to right occipital lobe damage from prior tumor resection with irradiation.  -Due to bradycardia, propofol sedation is being changed to Versed gtt. Recommend weaning off Versed at some point later today, followed by extubation if LTM EEG shows no recurrence of seizure activity -Continuing LTM EEG  30 minutes spent in the neurological evaluation and management of this critically ill patient   LOS: 1 day   @Electronically  signed: Dr. Caryl Pina 11/02/2022  10:22 AM

## 2022-11-03 ENCOUNTER — Ambulatory Visit (HOSPITAL_COMMUNITY)
Admission: RE | Admit: 2022-11-03 | Discharge: 2022-11-03 | Disposition: A | Discharge status: Home / Self Care | Payer: Medicare Other | Source: Ambulatory Visit | Attending: Internal Medicine | Admitting: Internal Medicine

## 2022-11-03 DIAGNOSIS — G40901 Epilepsy, unspecified, not intractable, with status epilepticus: Secondary | ICD-10-CM | POA: Diagnosis not present

## 2022-11-03 LAB — PHOSPHORUS
Phosphorus: 2.9 mg/dL (ref 2.5–4.6)
Phosphorus: 2.6 mg/dL (ref 2.5–4.6)

## 2022-11-03 LAB — CBC
HCT: 36.5 % — ABNORMAL LOW (ref 39.0–52.0)
Hemoglobin: 12.9 g/dL — ABNORMAL LOW (ref 13.0–17.0)
MCH: 33.5 pg (ref 26.0–34.0)
MCHC: 35.3 g/dL (ref 30.0–36.0)
MCV: 94.8 fL (ref 80.0–100.0)
Platelets: 103 10*3/uL — ABNORMAL LOW (ref 150–400)
RBC: 3.85 MIL/uL — ABNORMAL LOW (ref 4.22–5.81)
RDW: 12 % (ref 11.5–15.5)
WBC: 5.5 10*3/uL (ref 4.0–10.5)
nRBC: 0 % (ref 0.0–0.2)

## 2022-11-03 LAB — GLUCOSE, CAPILLARY
Glucose-Capillary: 158 mg/dL — ABNORMAL HIGH (ref 70–99)
Glucose-Capillary: 118 mg/dL — ABNORMAL HIGH (ref 70–99)
Glucose-Capillary: 87 mg/dL (ref 70–99)
Glucose-Capillary: 102 mg/dL — ABNORMAL HIGH (ref 70–99)
Glucose-Capillary: 130 mg/dL — ABNORMAL HIGH (ref 70–99)

## 2022-11-03 LAB — BASIC METABOLIC PANEL
Anion gap: 8 (ref 5–15)
BUN: 7 mg/dL (ref 6–20)
CO2: 18 mmol/L — ABNORMAL LOW (ref 22–32)
Calcium: 7.7 mg/dL — ABNORMAL LOW (ref 8.9–10.3)
Chloride: 111 mmol/L (ref 98–111)
Creatinine, Ser: 0.78 mg/dL (ref 0.61–1.24)
GFR, Estimated: >60 mL/min (ref >60)
Glucose, Bld: 130 mg/dL — ABNORMAL HIGH (ref 70–99)
Potassium: 3.3 mmol/L — ABNORMAL LOW (ref 3.5–5.1)
Sodium: 137 mmol/L (ref 135–145)

## 2022-11-03 LAB — MAGNESIUM
Magnesium: 1.8 mg/dL (ref 1.7–2.4)
Magnesium: 2.4 mg/dL (ref 1.7–2.4)

## 2022-11-03 LAB — LAMOTRIGINE LEVEL: Lamotrigine Lvl: 7.7 ug/mL (ref 2.0–20.0)

## 2022-11-03 MED ORDER — SODIUM CHLORIDE 0.9 % IV BOLUS
500.0000 mL | Freq: Once | INTRAVENOUS | Status: AC
  Administered 2022-11-03: 500 mL via INTRAVENOUS

## 2022-11-03 MED ORDER — MAGNESIUM SULFATE 4 GM/100ML IV SOLN
4.0000 g | Freq: Once | INTRAVENOUS | Status: AC
  Administered 2022-11-03: 4 g via INTRAVENOUS
  Filled 2022-11-03: qty 100

## 2022-11-03 MED ORDER — POTASSIUM CHLORIDE 20 MEQ PO PACK
40.0000 meq | PACK | Freq: Once | ORAL | Status: AC
  Administered 2022-11-03: 40 meq
  Filled 2022-11-03: qty 2

## 2022-11-03 NOTE — Procedures (Addendum)
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 EEG to evaluate for seizure   Level of alertness: comatose-->awake, asleep   AEDs during EEG study: LEV, LTG, versed   Technical aspects: This EEG study was done with scalp electrodes positioned according to the 10-20 International system of electrode placement. Electrical activity was reviewed with band pass filter of 1-70Hz , sensitivity of 7 uV/mm, display speed of 51mm/sec with a 60Hz  notched filter applied as appropriate. EEG data were recorded continuously and digitally stored.  Video monitoring was available and reviewed as appropriate.   Description: EEG initially showed continuous generalized 6 to 7 Hz theta slowing admixed with 13-15hz  beta activity. Gradually as sedation was adjusted, EEG showed posterior dominant rhythm of 8-9 Hz activity of moderate voltage (25-35 uV) seen predominantly in posterior head regions, symmetric and reactive to eye opening and eye closing. Sleep was characterized by vertex waves, sleep spindles (12 to 14 Hz), maximal frontocentral region. Intermittent generalized 3-6hz  theta-delta slowing was also noted. Hyperventilation and photic stimulation were not performed.      ABNORMALITY - Intermittent slow, generalized   IMPRESSION: This study was initially suggestive of severe diffuse encephalopathy, likely related to sedation. Gradually as sedation was  weaned off, EEG was suggestive of mild diffuse encephalopathy. No seizures or definite  epileptiform discharges were seen during the study.   Nadyne Gariepy Annabelle Harman

## 2022-11-03 NOTE — Progress Notes (Signed)
eLink Physician-Brief Progress Note Patient Name: Stephen Beard DOB: 1996/03/03 MRN: [id number]   Date of Service  11/03/2022  HPI/Events of Note  Patient with oliguria.  eICU Interventions  NS 500 ml fluid bolus over 2 hours.        Thomasene Lot Colin Norment 11/03/2022, 2:13 AM

## 2022-11-03 NOTE — Progress Notes (Signed)
 Maint done, no skin breakdown

## 2022-11-03 NOTE — Progress Notes (Signed)
NAME:  Stephen Beard, MRN:  [id number], DOB:  [birth year], LOS: 2 ADMISSION DATE:  10/31/2022, CONSULTATION DATE: 8/20 REFERRING MD:  Dr Suezanne Jacquet, CHIEF COMPLAINT: Status epilepticus  History of Present Illness:  27 year old male with past medical history as below, which is significant for anaplastic oligodendroglioma s/p resection and chemotherapy.  He has had seizures since that time which have not been managed with lamotrigine.  In the evening hours of 8/20 he suffered a seizure at home which went on for 15 minutes before the arrival of EMS.  He was treated with benzodiazepines by EMS without improvement.  He continued to seize upon arrival to the emergency department he was treated with additional benzodiazepines and IV Keppra bolus.  Duration of seizure activity estimated at 30 minutes.  Seizures did then eventually break, but the patient was unresponsive and desaturating requiring intubation in the emergency department.  He was evaluated by neurology in the emergency department who recommended additional Keppra, EEG, and MRI.  PCCM was asked to evaluate the patient for admission due to intubated status.  Pertinent  Medical History   has a past medical history of ADD (attention deficit disorder), ADHD, Bifrontal oligodendroglioma (HCC), Headache, Psychotic disorder (HCC), and Seizures (HCC).   Significant Hospital Events: Including procedures, antibiotic start and stop dates in addition to other pertinent events     Interim History / Subjective:  No further seizures noted on eeg. Bradycardia improved off propofol. Decreased uop and received some IVF bolus  Objective   Blood pressure 93/63, pulse 61, temperature 99.7 F (37.6 C), temperature source Oral, resp. rate (!) 28, height 5\' 8"  (1.727 m), weight 64.8 kg, SpO2 99%.    Vent Mode: PRVC FiO2 (%):  [40 %] 40 % Set Rate:  [28 bmp] 28 bmp Vt Set:  [550 mL] 550 mL PEEP:  [5 cmH20] 5 cmH20 Plateau Pressure:  [18 cmH20-21 cmH20] 18  cmH20   Intake/Output Summary (Last 24 hours) at 11/03/2022 1008 Last data filed at 11/03/2022 1000 Gross per 24 hour  Intake 4636.46 ml  Output 820 ml  Net 3816.46 ml   Filed Weights   10/31/22 2128 11/02/22 0500 11/03/22 0500  Weight: 64 kg 62.3 kg 64.8 kg    Examination: Gen:      Intubated, sedated, acutely ill appearing HEENT:  ETT to vent, LTM in place Lungs:    sounds of mechanical ventilation auscultated, no wheeze CV:         RRR Abd:      + bowel sounds; soft, non-tender; no palpable masses, no distension Ext:    No edema Skin:      Warm and dry; no rashes Neuro:   sedated, RASS -3  Cr wnl Na     Resolved Hospital Problem list   AKI   Assessment & Plan:   Status epilepticus Hx anaplastic oligodendroglioma s/p resection and chemotherapy Admit to neuro ICU Continue antiepileptics - will d/c propofol and switch to versed given bradycardia.  Sedation Appreciate neurology's input MRI brain shows unchanged 6mm acute hemorrhage, no evidence of tumor recurrence, trace SDH  Acute respiratory failure with hypoxia and hypercarbia: Maintain lung protective ventilation EEG without further seizures Working to lift sedation and extubate  Transaminitis - likely in the setting of seizures  Aspiration pna - on unasyn, complete 5 day course  Hypokalemia - replete enterally  Best Practice (right click and "Reselect all SmartList Selections" daily)   Diet/type: NPO with tube feeds DVT prophylaxis: prophylactic heparin  GI prophylaxis:  H2B Lines: N/A Foley:  Yes, and it is still needed retention meds started 8/21.  Code Status:  full code Last date of multidisciplinary goals of care discussion [ 8/22 mom updated at bedside  ]  Critical care time:    The patient is critically ill due to status epilepticus, respiratory failure.  Critical care was necessary to treat or prevent imminent or life-threatening deterioration.  Critical care was time spent personally by  me on the following activities: development of treatment plan with patient and/or surrogate as well as nursing, discussions with consultants, evaluation of patient's response to treatment, examination of patient, obtaining history from patient or surrogate, ordering and performing treatments and interventions, ordering and review of laboratory studies, ordering and review of radiographic studies, pulse oximetry, re-evaluation of patient's condition and participation in multidisciplinary rounds.   Critical Care Time devoted to patient care services described in this note is 38 minutes. This time reflects time of care of this signee Charlott Holler . This critical care time does not reflect separately billable procedures or procedure time, teaching time or supervisory time of PA/NP/Med student/Med Resident etc but could involve care discussion time.       Mickel Baas Pulmonary [facility] Critical Care Medicine 11/03/2022 10:09 AM  Pager: see AMION  If no response to pager , please call critical care on call (see AMION) until 7pm After 7:00 pm call Elink

## 2022-11-03 NOTE — Progress Notes (Signed)
Subjective: Intubated and sedated. CCM planning for extubation today.   Objective: Current vital signs: BP 95/66   Pulse (!) 59   Temp 99.4 F (37.4 C)   Resp (!) 28   Ht 5\' 8"  (1.727 m)   Wt 64.8 kg   SpO2 100%   BMI 21.72 kg/m  Vital signs in last 24 hours: Temp:  [98.8 F (37.1 C)-101 F (38.3 C)] 99.4 F (37.4 C) (08/22 0600) Pulse Rate:  [42-78] 59 (08/22 0700) Resp:  [23-28] 28 (08/22 0700) BP: (91-127)/(52-98) 95/66 (08/22 0700) SpO2:  [96 %-100 %] 100 % (08/22 0700) FiO2 (%):  [40 %] 40 % (08/22 0309) Weight:  [64.8 kg] 64.8 kg (08/22 0500)  Intake/Output from previous day: 08/21 0701 - 08/22 0700 In: 3959.9 [I.V.:924; NG/GT:746.3; IV Piggyback:2289.6] Out: 875 [Urine:875] Intake/Output this shift: No intake/output data recorded. Nutritional status:  Diet Order             Diet NPO time specified  Diet effective now                  HEENT: [place]/AT Lungs: Intubated Ext: No edema   Neurologic Exam: Ment: Intubated and sedated. Minimal movement of lips and head to stimulation. Minimal movement of limbs to stimulation. GCS 3t.  CN: Pupils equal and unreactive (on IV sedation). Eyes are conjugate and at the midline. Weak corneal reflexes. Face flaccidly symmetric.  Motor/Sensory: Flaccid tone x 4 with minimal movement to stimulation  Reflexes: Hypoactive reflexes.  Cerebellar/Gait: Unable to assess  Lab Results: Results for orders placed or performed during the hospital encounter of 10/31/22 (from the past 48 hour(s))  Basic metabolic panel     Status: Abnormal   Collection Time: 11/01/22  4:34 PM  Result Value Ref Range   Sodium 138 135 - 145 mmol/L   Potassium 3.3 (L) 3.5 - 5.1 mmol/L   Chloride 104 98 - 111 mmol/L   CO2 22 22 - 32 mmol/L   Glucose, Bld 79 70 - 99 mg/dL    Comment: Glucose reference range applies only to samples taken after fasting for at least 8 hours.   BUN 10 6 - 20 mg/dL   Creatinine, Ser 8.11 0.61 - 1.24 mg/dL   Calcium 7.8  (L) 8.9 - 10.3 mg/dL   GFR, Estimated >91 >47 mL/min    Comment: (NOTE) Calculated using the CKD-EPI Creatinine Equation (2021)    Anion gap 12 5 - 15    Comment: Performed at [facility], [address] [place], [place] [address]  Procalcitonin     Status: None   Collection Time: 11/02/22  5:26 AM  Result Value Ref Range   Procalcitonin 8.56 ng/mL    Comment:        Interpretation: PCT > 2 ng/mL: Systemic infection (sepsis) is likely, unless other causes are known. (NOTE)       Sepsis PCT Algorithm           Lower Respiratory Tract                                      Infection PCT Algorithm    ----------------------------     ----------------------------         PCT < 0.25 ng/mL                PCT < 0.10 ng/mL  Strongly encourage             Strongly discourage   discontinuation of antibiotics    initiation of antibiotics    ----------------------------     -----------------------------       PCT 0.25 - 0.50 ng/mL            PCT 0.10 - 0.25 ng/mL               OR       >80% decrease in PCT            Discourage initiation of                                            antibiotics      Encourage discontinuation           of antibiotics    ----------------------------     -----------------------------         PCT >= 0.50 ng/mL              PCT 0.26 - 0.50 ng/mL               AND       <80% decrease in PCT              Encourage initiation of                                             antibiotics       Encourage continuation           of antibiotics    ----------------------------     -----------------------------        PCT >= 0.50 ng/mL                  PCT > 0.50 ng/mL               AND         increase in PCT                  Strongly encourage                                      initiation of antibiotics    Strongly encourage escalation           of antibiotics                                     -----------------------------                                            PCT <= 0.25 ng/mL                                                 OR                                        >  80% decrease in PCT                                      Discontinue / Do not initiate                                             antibiotics  Performed at [facility], [address], [place], [place] [address]   Basic metabolic panel     Status: Abnormal   Collection Time: 11/02/22  5:26 AM  Result Value Ref Range   Sodium 134 (L) 135 - 145 mmol/L   Potassium 3.7 3.5 - 5.1 mmol/L    Comment: HEMOLYSIS AT THIS LEVEL MAY AFFECT RESULT   Chloride 106 98 - 111 mmol/L   CO2 16 (L) 22 - 32 mmol/L   Glucose, Bld 76 70 - 99 mg/dL    Comment: Glucose reference range applies only to samples taken after fasting for at least 8 hours.   BUN 8 6 - 20 mg/dL   Creatinine, Ser 6.04 0.61 - 1.24 mg/dL   Calcium 7.5 (L) 8.9 - 10.3 mg/dL   GFR, Estimated >54 >09 mL/min    Comment: (NOTE) Calculated using the CKD-EPI Creatinine Equation (2021)    Anion gap 12 5 - 15    Comment: Performed at [facility], [address], [place], [place] [address]  CBC     Status: Abnormal   Collection Time: 11/02/22  5:26 AM  Result Value Ref Range   WBC 6.9 4.0 - 10.5 K/uL   RBC 3.87 (L) 4.22 - 5.81 MIL/uL   Hemoglobin 13.4 13.0 - 17.0 g/dL   HCT 47.8 (L) 29.5 - 62.1 %   MCV 99.0 80.0 - 100.0 fL   MCH 34.6 (H) 26.0 - 34.0 pg   MCHC 35.0 30.0 - 36.0 g/dL   RDW 30.8 65.7 - 84.6 %   Platelets 117 (L) 150 - 400 K/uL   nRBC 0.0 0.0 - 0.2 %    Comment: Performed at [facility], [address], [place], [place] [address]  MRSA Next Gen by PCR, Nasal     Status: None   Collection Time: 11/02/22  9:45 AM   Specimen: Nasal Mucosa; Nasal Swab  Result Value Ref Range   MRSA by PCR Next Gen NOT DETECTED NOT DETECTED    Comment: (NOTE) The GeneXpert MRSA Assay (FDA approved for NASAL specimens only), is one component of a comprehensive MRSA colonization  surveillance program. It is not intended to diagnose MRSA infection nor to guide or monitor treatment for MRSA infections. Test performance is not FDA approved in patients less than 57 years old. Performed at [facility], [address], [place], [place] [address]   Magnesium     Status: None   Collection Time: 11/02/22  2:50 PM  Result Value Ref Range   Magnesium 1.9 1.7 - 2.4 mg/dL    Comment: Performed at [facility], [address], [place], [place] [address]  Phosphorus     Status: Abnormal   Collection Time: 11/02/22  2:50 PM  Result Value Ref Range   Phosphorus 2.1 (L) 2.5 - 4.6 mg/dL    Comment: Performed at [facility], [address], [place],  [place] [address]  Glucose, capillary     Status: None   Collection Time: 11/02/22  3:49 PM  Result Value Ref Range   Glucose-Capillary 83 70 - 99 mg/dL    Comment: Glucose reference range applies only to samples taken after fasting for at least 8 hours.  Magnesium     Status: None   Collection Time: 11/02/22  6:21 PM  Result Value Ref Range   Magnesium 2.0 1.7 - 2.4 mg/dL    Comment: Performed at [facility], [address], [place], [place] [address]  Phosphorus     Status: Abnormal   Collection Time: 11/02/22  6:21 PM  Result Value Ref Range   Phosphorus 2.1 (L) 2.5 - 4.6 mg/dL    Comment: Performed at [facility], [address] [place], [place] [address]  Glucose, capillary     Status: None   Collection Time: 11/02/22  7:19 PM  Result Value Ref Range   Glucose-Capillary 93 70 - 99 mg/dL    Comment: Glucose reference range applies only to samples taken after fasting for at least 8 hours.  Glucose, capillary     Status: Abnormal   Collection Time: 11/02/22 11:22 PM  Result Value Ref Range   Glucose-Capillary 112 (H) 70 - 99 mg/dL    Comment: Glucose reference range applies only to samples taken after fasting for at least 8 hours.  Glucose, capillary     Status: Abnormal   Collection  Time: 11/03/22  3:24 AM  Result Value Ref Range   Glucose-Capillary 158 (H) 70 - 99 mg/dL    Comment: Glucose reference range applies only to samples taken after fasting for at least 8 hours.  Basic metabolic panel     Status: Abnormal   Collection Time: 11/03/22  5:48 AM  Result Value Ref Range   Sodium 137 135 - 145 mmol/L   Potassium 3.3 (L) 3.5 - 5.1 mmol/L   Chloride 111 98 - 111 mmol/L   CO2 18 (L) 22 - 32 mmol/L   Glucose, Bld 130 (H) 70 - 99 mg/dL    Comment: Glucose reference range applies only to samples taken after fasting for at least 8 hours.   BUN 7 6 - 20 mg/dL   Creatinine, Ser 9.14 0.61 - 1.24 mg/dL   Calcium 7.7 (L) 8.9 - 10.3 mg/dL   GFR, Estimated >78 >29 mL/min    Comment: (NOTE) Calculated using the CKD-EPI Creatinine Equation (2021)    Anion gap 8 5 - 15    Comment: Performed at [facility], [address] [place], [place] [address]  CBC     Status: Abnormal   Collection Time: 11/03/22  5:48 AM  Result Value Ref Range   WBC 5.5 4.0 - 10.5 K/uL   RBC 3.85 (L) 4.22 - 5.81 MIL/uL   Hemoglobin 12.9 (L) 13.0 - 17.0 g/dL   HCT 08.6 (L) 57.8 - 46.9 %   MCV 94.8 80.0 - 100.0 fL   MCH 33.5 26.0 - 34.0 pg   MCHC 35.3 30.0 - 36.0 g/dL   RDW 62.9 52.8 - 41.3 %   Platelets 103 (L) 150 - 400 K/uL   nRBC 0.0 0.0 - 0.2 %    Comment: Performed at [facility], [address], [place], [place] [address]  Magnesium     Status: None   Collection Time: 11/03/22  5:48 AM  Result Value Ref Range   Magnesium 1.8 1.7 - 2.4  mg/dL    Comment: Performed at [facility], [address], [place], [place] [address]  Phosphorus     Status: None   Collection Time: 11/03/22  5:48 AM  Result Value Ref Range   Phosphorus 2.9 2.5 - 4.6 mg/dL    Comment: Performed at [facility], [address], [place], [place] [address]  Glucose, capillary     Status: Abnormal   Collection Time: 11/03/22  7:20 AM  Result Value Ref Range   Glucose-Capillary 118 (H) 70 -  99 mg/dL    Comment: Glucose reference range applies only to samples taken after fasting for at least 8 hours.    Recent Results (from the past 240 hour(s))  Culture, blood (routine x 2)     Status: None (Preliminary result)   Collection Time: 11/01/22  1:13 AM   Specimen: BLOOD RIGHT HAND  Result Value Ref Range Status   Specimen Description BLOOD RIGHT HAND  Final   Special Requests   Final    BOTTLES DRAWN AEROBIC AND ANAEROBIC Blood Culture adequate volume   Culture   Final    NO GROWTH 2 DAYS Performed at [facility], [address] [place], [place] [address]    Report Status PENDING  Incomplete  Culture, blood (routine x 2)     Status: None (Preliminary result)   Collection Time: 11/01/22  1:13 AM   Specimen: BLOOD LEFT HAND  Result Value Ref Range Status   Specimen Description BLOOD LEFT HAND  Final   Special Requests   Final    BOTTLES DRAWN AEROBIC AND ANAEROBIC Blood Culture adequate volume   Culture   Final    NO GROWTH 2 DAYS Performed at [facility], [address], [place], [place] [address]    Report Status PENDING  Incomplete  MRSA Next Gen by PCR, Nasal     Status: None   Collection Time: 11/02/22  9:45 AM   Specimen: Nasal Mucosa; Nasal Swab  Result Value Ref Range Status   MRSA by PCR Next Gen NOT DETECTED NOT DETECTED Final    Comment: (NOTE) The GeneXpert MRSA Assay (FDA approved for NASAL specimens only), is one component of a comprehensive MRSA colonization surveillance program. It is not intended to diagnose MRSA infection nor to guide or monitor treatment for MRSA infections. Test performance is not FDA approved in patients less than 46 years old. Performed at [facility], [address] [place], [place] [address]     Lipid Panel No results for input(s): "CHOL", "TRIG", "HDL", "CHOLHDL", "VLDL", "LDLCALC" in the last 72 hours.  Studies/Results: DG Abd Portable 1V  Result Date: 11/01/2022 CLINICAL DATA:  Nasogastric  tube placement.  OG tube placement. EXAM: PORTABLE ABDOMEN - 1 VIEW COMPARISON:  None Available. FINDINGS: Tip and side port of the enteric tube below the diaphragm in the stomach. Nonobstructive upper abdominal bowel gas pattern. IMPRESSION: Tip and side port of the enteric tube below the diaphragm in the stomach. Electronically Signed   By: Narda Rutherford M.D.   On: 11/01/2022 16:49   MR Brain W and Wo Contrast  Result Date: 11/01/2022 CLINICAL DATA:  Seizure, new-onset, no history of trauma Status epilepticus, status post brain tumor resection, hemorrhage versus mass on CT scan. EXAM: MRI HEAD WITHOUT AND WITH CONTRAST TECHNIQUE: Multiplanar, multiecho pulse sequences of the brain and surrounding structures were obtained without and with intravenous contrast. CONTRAST:  7mL GADAVIST GADOBUTROL 1 MMOL/ML IV  SOLN COMPARISON:  Brain MRI 04/19/2022.  Head CT 10/31/2022. FINDINGS: Brain: Unchanged 6 mm focus of acute hemorrhage along the anterior margin of the right occipital resection cavity. No new or nodular enhancement to suggest local recurrence. Unchanged surrounding T2 hyperintensity. Trace subdural hemorrhage along the right parietal convexity (axial image 27 series 6). No hydrocephalus, mass effect or midline shift. Vascular: Normal flow voids and vessel enhancement. Skull and upper cervical spine: Normal marrow signal and enhancement. Sinuses/Orbits: No acute findings. Other: None. IMPRESSION: 1. Unchanged 6 mm focus of acute hemorrhage along the anterior margin of the right occipital resection cavity. No new or nodular enhancement to suggest local recurrence. 2. Trace subdural hemorrhage along the right parietal convexity. Electronically Signed   By: Orvan Falconer M.D.   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 EEG to evaluate for seizure Level of alertness:  comatose-->awake, asleep AEDs during EEG study: LEV, LTG, propofol Technical aspects: This EEG study was done with scalp electrodes positioned according to the 10-20 International system of electrode placement. Electrical activity was reviewed with band pass filter of 1-70Hz , sensitivity of 7 uV/mm, display speed of 13mm/sec with a 60Hz  notched filter applied as appropriate. EEG data were recorded continuously and digitally stored.  Video monitoring was available and reviewed as appropriate. Description: At the study, EEG showed continuous generalized 3 to 5 Hz 11 delta slowing with overriding 12 to 15 Hz beta activity.  Gradually as sedation was adjusted, EEG showed posterior dominant rhythm of 8-9 Hz activity of moderate voltage (25-35 uV) seen predominantly in posterior head regions, symmetric and reactive to eye opening and eye closing. Sleep was characterized by vertex waves, sleep spindles (12 to 14 Hz), maximal frontocentral region. Continuous 3 to 5 Hz theta-delta slowing was also noted in right parieto-occipital region. Hyperventilation and photic stimulation were not performed.   ABNORMALITY - Continuous slow, generalized and maximal right parieto-occipital region IMPRESSION: This study was initially suggestive of severe diffuse encephalopathy likely related to sedation.  Gradually sedation was adjusted, EEG improved.  Subsequently EEG was suggestive of cortical dysfunction in right parietal occipital region likely secondary to  underlying structural abnormality.  No seizures or definite epileptiform discharges were seen during the study. Stephen Beard    Medications: Scheduled:  bethanechol  10 mg Per Tube TID   Chlorhexidine Gluconate Cloth  6 each Topical Daily   famotidine  20 mg Per Tube BID   heparin  5,000 Units Subcutaneous Q8H   lamoTRIgine  250 mg Per Tube BID   mouth rinse  15 mL Mouth Rinse Q2H   Continuous:  sodium chloride 250 mL (11/03/22 0714)   ampicillin-sulbactam (UNASYN) IV Stopped (11/03/22 0212)   feeding supplement (OSMOLITE 1.5 CAL) 60 mL/hr at 11/03/22 0500   fentaNYL infusion INTRAVENOUS 75 mcg/hr (11/03/22 0634)   lacosamide (VIMPAT) IV Stopped (11/02/22 2227)   levETIRAcetam 750 mg (11/03/22 0715)   midazolam 20 mg/hr (11/03/22 0636)   norepinephrine (LEVOPHED) Adult infusion Stopped (11/02/22 1846)  propofol (DIPRIVAN) infusion Stopped (11/02/22 1048)   Assessment: 27 year old male with 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 He was emergently intubated for airway protection. He received Keppra IV 3000 mg and was started on propofol gtt.  - Exam today under lower levels of sedation now with corneal reflexes present and slight movement to noxious, otherwise unchanged.   - CT head: New 6 x 5 x 5 mm hyperdense focus along the anterior aspect of the right occipital resection cavity, concerning for recurrent tumor versus small focus of hemorrhage.  - MRI brain w/wo contrast (8/20): Unchanged 6 mm focus of acute hemorrhage along the anterior margin of the right occipital resection cavity. No new or  nodular enhancement to suggest local recurrence. Trace subdural hemorrhage along the right parietal convexity. - LTM EEG report for this morning (11/02/2022 2303 to 11/03/2022 0855):  Continuous slow, generalized. This study was suggestive of severe diffuse encephalopathy, likely related to sedation.  No seizures or definite epileptiform discharges were seen during the study.  - Lamotrigine level has come back at < 1.0, consistent with noncompliance. Of note, levels drawn in March 2024 and September 2023 were therapeutic.  - Impression: Breakthrough seizures and status epilepticus. The patient's mother states that he has been compliant with his home lamotrigine regimen, but lamotrigine level of < 1.0 is more consistent with semicompliance or total noncompliance. MRI not consistent with tumor progression. It is felt that his breakthrough seizure leading to status epilepticus was most likely due to poor AED compliance. Of note, he does have a prior history of AED noncompliance and drug abuse (methamphetamine).      Recommendations: -Continue home lamotrigine of 250 mg twice daily. Administering per tube -Discontinuing Keppra -Dr. Liana Gerold (Neurooncology) outpatient clinic note documents his thoughts on Vimpat being a good choice for a second AED, if needed. Vimpat was started yesterday (8/21) at 100 mg IV BID. -Inpatient seizure precautions. -Per mother, he does not drive due to seizures and left visual field cut secondary to right occipital lobe damage from prior tumor resection with irradiation.  -Extubation is planned for today. -Continuing LTM EEG   30 minutes spent in the neurological evaluation and management of this critically ill patient   LOS: 2 days   @Electronically  signed: Dr. Caryl Pina 11/03/2022  7:38 AM

## 2022-11-04 ENCOUNTER — Inpatient Hospital Stay (HOSPITAL_COMMUNITY): Payer: Medicare Other

## 2022-11-04 ENCOUNTER — Encounter: Payer: Self-pay | Admitting: Internal Medicine

## 2022-11-04 ENCOUNTER — Other Ambulatory Visit (HOSPITAL_COMMUNITY): Payer: Self-pay

## 2022-11-04 DIAGNOSIS — G40901 Epilepsy, unspecified, not intractable, with status epilepticus: Secondary | ICD-10-CM | POA: Diagnosis not present

## 2022-11-04 DIAGNOSIS — J988 Other specified respiratory disorders: Secondary | ICD-10-CM | POA: Diagnosis not present

## 2022-11-04 LAB — GLUCOSE, CAPILLARY
Glucose-Capillary: 108 mg/dL — ABNORMAL HIGH (ref 70–99)
Glucose-Capillary: 112 mg/dL — ABNORMAL HIGH (ref 70–99)
Glucose-Capillary: 120 mg/dL — ABNORMAL HIGH (ref 70–99)
Glucose-Capillary: 130 mg/dL — ABNORMAL HIGH (ref 70–99)
Glucose-Capillary: 134 mg/dL — ABNORMAL HIGH (ref 70–99)
Glucose-Capillary: 139 mg/dL — ABNORMAL HIGH (ref 70–99)
Glucose-Capillary: 142 mg/dL — ABNORMAL HIGH (ref 70–99)

## 2022-11-04 MED ORDER — LORAZEPAM 2 MG/ML IJ SOLN: 2.0000 mg | INTRAMUSCULAR | Status: DC | PRN

## 2022-11-04 MED ORDER — ORAL CARE MOUTH RINSE
15.0000 mL | OROMUCOSAL | Status: DC
  Administered 2022-11-04 – 2022-11-05 (×8): 15 mL via OROMUCOSAL

## 2022-11-04 MED ORDER — OSMOLITE 1.5 CAL PO LIQD
1000.0000 mL | ORAL | Status: DC
  Administered 2022-11-04 – 2022-11-06 (×3): 1000 mL
  Filled 2022-11-04: qty 1000

## 2022-11-04 NOTE — Procedures (Addendum)
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 EEG to evaluate for seizure   Level of alertness: awake, asleep   AEDs during EEG study: LCM, LTG   Technical aspects: This EEG study was done with scalp electrodes positioned according to the 10-20 International system of electrode placement. Electrical activity was reviewed with band pass filter of 1-70Hz , sensitivity of 7 uV/mm, display speed of 14mm/sec with a 60Hz  notched filter applied as appropriate. EEG data were recorded continuously and digitally stored.  Video monitoring was available and reviewed as appropriate.   Description: EEG showed posterior dominant rhythm of 8-9 Hz activity of moderate voltage (25-35 uV) seen predominantly in posterior head regions, symmetric and reactive to eye opening and eye closing. Sleep was characterized by vertex waves, sleep spindles (12 to 14 Hz), maximal frontocentral region. Intermittent generalized and maximal right parieto-occipital region 3 to 5 Hz theta-delta slowing was also noted. Sharp waves were noted in right parieto-occipital region.  Hyperventilation and photic stimulation were not performed.      ABNORMALITY - Sharp waves, right parieto-occipital region  - Intermittent slow, generalized and maximal right parieto-occipital region   IMPRESSION: This study was suggestive of epileptogenicity and cortical dysfunction in right parieto- occipital region likely secondary to underlying structural abnormality. Additionally  there is mild diffuse encephalopathy. No seizures were seen during the study.   Keanen Dohse Annabelle Harman

## 2022-11-04 NOTE — Progress Notes (Signed)
Nutrition Follow-up  DOCUMENTATION CODES:   Not applicable  INTERVENTION:   Resume tube feeding via Cortrak tube: Osmolite 1.5 at 30 ml/h and increase by 10 ml every 8 hours to goal rate of 60 ml/hr (1440 ml per day)  Provides 2160 kcal, 90 gm protein, 1094 ml free water daily   NUTRITION DIAGNOSIS:   Inadequate oral intake related to inability to eat as evidenced by NPO status. Ongoing.   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    Pt discussed during ICU rounds and with RN.  Pt extubated this am, cortrak placed Spoke with Dr Craige Cotta, ok to resumed TF post extubation  8/23 - s/p cortrak placement with tip distal gastric/pylorus  Medications reviewed   Labs reviewed:  K 3.3 CBG's: 87-139    Diet Order:   Diet Order             Diet NPO time specified  Diet effective now                   EDUCATION NEEDS:   Not appropriate for education at this time  Skin:  Skin Assessment: Reviewed RN Assessment  Last BM:  unknown, abd soft  Height:   Ht Readings from Last 1 Encounters:  10/31/22 5\' 8"  (1.727 m)    Weight:   Wt Readings from Last 1 Encounters:  11/04/22 64.2 kg    BMI:  Body mass index is 21.52 kg/m.  Estimated Nutritional Needs:   Kcal:  2000-2300  Protein:  85-95 grams  Fluid:  >2 L/day  Cammy Copa., RD, LDN, CNSC See AMiON for contact information

## 2022-11-04 NOTE — Procedures (Signed)
Cortrak  Person Inserting Tube:  Hunter, Rachel D, RD Tube Type:  Cortrak - 43 inches Tube Size:  10 Tube Location:  Left nare Secured by: Bridle Technique Used to Measure Tube Placement:  Marking at nare/corner of mouth Cortrak Secured At:  67 cm Procedure Comments:  Cortrak Tube Team Note:  Consult received to place a Cortrak feeding tube.   X-ray is required, abdominal x-ray has been ordered by the Cortrak team. Please confirm tube placement before using the Cortrak tube.   If the tube becomes dislodged please keep the tube and contact the Cortrak team at [email or web address] for replacement.  If after hours and replacement cannot be delayed, place a NG tube and confirm placement with an abdominal x-ray.    Rachel Hunter, RD, LDN Clinical Dietitian RD pager # available in AMION  After hours/weekend pager # available in AMION    

## 2022-11-04 NOTE — Progress Notes (Signed)
LTM maint complete - no skin breakdown seen Serviced P3 F3 [facility] monitored, Event button test confirmed by [facility].

## 2022-11-04 NOTE — Procedures (Signed)
Extubation Procedure Note  Patient Details:   Name: Stephen Beard DOB: Mar 19, 1995 MRN: [id number]   Airway Documentation:    Vent end date: 11/04/22 Vent end time: 1221   Evaluation  O2 sats: stable throughout Complications: No apparent complications Patient did tolerate procedure well. Bilateral Breath Sounds: Clear, Diminished   No  RT extubated pt per order to 4L [name]. Pt had a positive cuff leak, no stridor was noted, pt has a weak cough and unable to clearly state name at this time.   Kerri Perches 11/04/2022, 12:21 PM

## 2022-11-04 NOTE — Progress Notes (Signed)
   NAME:  Stephen Beard, MRN:  [id number], DOB:  10-15-1995, LOS: 3 ADMISSION DATE:  10/31/2022, CONSULTATION DATE: 8/20 REFERRING MD:  Dr Suezanne Jacquet, CHIEF COMPLAINT: Status epilepticus  History of Present Illness:  27 year old male with past medical history as below, which is significant for anaplastic oligodendroglioma s/p resection and chemotherapy.  He has had seizures since that time which have not been managed with lamotrigine.  In the evening hours of 8/20 he suffered a seizure at home which went on for 15 minutes before the arrival of EMS.  He was treated with benzodiazepines by EMS without improvement.  He continued to seize upon arrival to the emergency department he was treated with additional benzodiazepines and IV Keppra bolus.  Duration of seizure activity estimated at 30 minutes.  Seizures did then eventually break, but the patient was unresponsive and desaturating requiring intubation in the emergency department.  He was evaluated by neurology in the emergency department who recommended additional Keppra, EEG, and MRI.  PCCM was asked to evaluate the patient for admission due to intubated status.  Pertinent  Medical History   has a past medical history of ADD (attention deficit disorder), ADHD, Bifrontal oligodendroglioma (HCC), Headache, Psychotic disorder (HCC), and Seizures (HCC).   Significant Hospital Events: Including procedures, antibiotic start and stop dates in addition to other pertinent events     Interim History / Subjective:  Off sedation.  Tolerating SBT.  Objective   Blood pressure (!) 109/91, pulse 81, temperature 99.9 F (37.7 C), temperature source Oral, resp. rate (!) 28, height 5\' 8"  (1.727 m), weight 64.2 kg, SpO2 93%.    Vent Mode: PSV;CPAP FiO2 (%):  [40 %] 40 % Set Rate:  [28 bmp] 28 bmp Vt Set:  [550 mL] 550 mL PEEP:  [5 cmH20] 5 cmH20 Pressure Support:  [10 cmH20] 10 cmH20 Plateau Pressure:  [15 cmH20-19 cmH20] 15 cmH20   Intake/Output Summary  (Last 24 hours) at 11/04/2022 0805 Last data filed at 11/04/2022 0700 Gross per 24 hour  Intake 2646.32 ml  Output 2185 ml  Net 461.32 ml   Filed Weights   11/02/22 0500 11/03/22 0500 11/04/22 0500  Weight: 62.3 kg 64.8 kg 64.2 kg    Examination:  General - somnolent Eyes - pupils reactive ENT - ETT in place Cardiac - regular rate/rhythm, no murmur Chest - equal breath sounds b/l, no wheezing or rales Abdomen - soft, non tender, + bowel sounds Extremities - no cyanosis, clubbing, or edema Skin - no rashes Neuro - moves extremities  Resolved Hospital Problem list   AKI  Assessment & Plan:   Status epilepticus Hx anaplastic oligodendroglioma s/p resection and chemotherapy - AEDs, LTM per neuro  Acute respiratory failure with hypoxia and hypercarbia: - proceed with extubation 8/23  Transaminitis - likely in the setting of seizures  Aspiration pna - on unasyn >> d/c after dosing on 8/23 to complete 5 day course of therapy  Hypokalemia - f/u BMET  Best Practice (right click and "Reselect all SmartList Selections" daily)   Diet/type: NPO DVT prophylaxis: prophylactic heparin  GI prophylaxis: N/A Lines: N/A Foley:  Yes, and it is still needed retention meds started 8/21.  Code Status:  full code Last date of multidisciplinary goals of care discussion [ 8/22 mom updated at bedside  ]  Critical care time: 33 minutes  Coralyn Helling, MD [facility] Pulmonary/Critical Care Pager - [phone] or [phone] 11/04/2022, 8:07 AM

## 2022-11-04 NOTE — TOC Benefit Eligibility Note (Signed)
Patient Product/process development scientist completed.    The patient is insured through [employer]. Patient has Medicare and is not eligible for a copay card, but may be able to apply for patient assistance, if available.    Ran test claim for Valtoco 15 mg and the current 30 day co-pay is $4.60.   This test claim was processed through [facility]- copay amounts may vary at other pharmacies due to pharmacy/plan contracts, or as the patient moves through the different stages of their insurance plan.     Roland Earl, CPHT Pharmacy Technician III Certified Patient Advocate [facility] Patient Advocate Team Direct Number: [phone]  Fax: [phone]

## 2022-11-04 NOTE — Progress Notes (Signed)
Subjective: No seizures overnight.  ICU team planning to extubate later today.  Mother and grandmother at bedside.  ROS: Unable to obtain due to poor mental status  Examination  Vital signs in last 24 hours: Temp:  [98.9 F (37.2 C)-100 F (37.8 C)] 100 F (37.8 C) (08/23 0800) Pulse Rate:  [60-102] 72 (08/23 1043) Resp:  [15-28] 15 (08/23 1043) BP: (93-144)/(61-103) 119/72 (08/23 1043) SpO2:  [93 %-100 %] 100 % (08/23 1043) FiO2 (%):  [40 %] 40 % (08/23 1043) Weight:  [64.2 kg] 64.2 kg (08/23 0500)  General: lying in bed, intubated Neuro: Opens eyes to tactile stimulation but does not follow commands for me) per RN did stick out his tongue and wiggle his toes earlier), PERRLA, no forced deviation, moving all 4 extremities with antigravity strength with left upper extremity weakness  Basic Metabolic Panel: Recent Labs  Lab 10/31/22 2110 10/31/22 2158 11/01/22 0342 11/01/22 0606 11/01/22 1634 11/02/22 0526 11/02/22 1450 11/02/22 1821 11/03/22 0548 11/03/22 1757  NA 139   < > 135 135 138 134*  --   --  137  --   K 4.2   < > 3.3* 3.3* 3.3* 3.7  --   --  3.3*  --   CL 96*  --  104  --  104 106  --   --  111  --   CO2 11*  --  19*  --  22 16*  --   --  18*  --   GLUCOSE 222*  --  93  --  79 76  --   --  130*  --   BUN 14  --  16  --  10 8  --   --  7  --   CREATININE 1.69*  --  1.00  --  1.22 1.12  --   --  0.78  --   CALCIUM 9.3  --  7.8*  --  7.8* 7.5*  --   --  7.7*  --   MG  --   --  2.5*  --   --   --  1.9 2.0 1.8 2.4  PHOS  --   --  2.7  --   --   --  2.1* 2.1* 2.9 2.6   < > = values in this interval not displayed.    CBC: Recent Labs  Lab 10/31/22 2110 10/31/22 2158 11/01/22 0158 11/01/22 0342 11/01/22 0606 11/02/22 0526 11/03/22 0548  WBC 14.5*  --   --  9.9  --  6.9 5.5  NEUTROABS 8.3*  --   --   --   --   --   --   HGB 17.5*   < > 14.6 16.0 14.3 13.4 12.9*  HCT 53.4*   < > 43.0 45.0 42.0 38.3* 36.5*  MCV 102.1*  --   --  95.1  --  99.0 94.8  PLT 243   --   --  141*  --  117* 103*   < > = values in this interval not displayed.     Coagulation Studies: No results for input(s): "LABPROT", "INR" in the last 72 hours.  Imaging personally reviewed MRI brain with and without contrast 11/01/2022: Unchanged 6 mm focus of acute hemorrhage along the anterior margin of the right occipital resection cavity. No new or nodular enhancement to suggest local recurrence.Trace subdural hemorrhage along the right parietal convexity.  ASSESSMENT AND PLAN: 27 year old male with past medical history of anaplastic oligodendroglioma in right  frontal region status post resection with residual remaining tumor, complicated by complicated by intracerebral hematoma, status post chemotherapy, history of seizures on lamotrigine who presented with breakthrough seizures requiring intubation.  Epilepsy with breakthrough seizure -Lamotrigine level was less than 1 on arrival.  Therefore likely etiology of breakthrough seizures medication noncompliance  Recommendation -Continue lamotrigine 250 mg twice daily. -Added Vimpat 100 mg twice daily during this hospitalization.  However if patient was not not compliant with lamotrigine, then might be able to discontinue this -Rescue medication: Recommend intranasal Valtoco 15 mg for clinical seizure lasting more than 2 minutes.  Of note patient will need a new prescription for this -Will need education about medication compliance and seizure provoking factors once patient is extubated -Will DC LTM EEG tomorrow if no seizures after being extubated -Continue seizure precautions -As needed IV Ativan for clinical seizures while inpatient -Continue to follow-up with Dr. Barbaraann Cao.  Will send him a message via epic -Discussed plan with ICU RN as well as family at bedside  I have spent a total of  36  minutes with the patient reviewing hospital notes,  test results, labs and examining the patient as well as establishing an assessment and plan.   > 50% of time was spent in direct patient care.        Lindie Spruce Epilepsy [facility] For questions after 5pm please refer to AMION to reach the Neurologist on call

## 2022-11-05 ENCOUNTER — Other Ambulatory Visit: Payer: Self-pay

## 2022-11-05 DIAGNOSIS — G40901 Epilepsy, unspecified, not intractable, with status epilepticus: Secondary | ICD-10-CM | POA: Diagnosis not present

## 2022-11-05 LAB — CBC
HCT: 41.5 % (ref 39.0–52.0)
Hemoglobin: 15 g/dL (ref 13.0–17.0)
MCH: 34.2 pg — ABNORMAL HIGH (ref 26.0–34.0)
MCHC: 36.1 g/dL — ABNORMAL HIGH (ref 30.0–36.0)
MCV: 94.7 fL (ref 80.0–100.0)
Platelets: 143 10*3/uL — ABNORMAL LOW (ref 150–400)
RBC: 4.38 MIL/uL (ref 4.22–5.81)
RDW: 11.5 % (ref 11.5–15.5)
WBC: 6.5 10*3/uL (ref 4.0–10.5)
nRBC: 0 % (ref 0.0–0.2)

## 2022-11-05 LAB — MAGNESIUM: Magnesium: 2 mg/dL (ref 1.7–2.4)

## 2022-11-05 LAB — BASIC METABOLIC PANEL
Anion gap: 13 (ref 5–15)
BUN: <5 mg/dL — ABNORMAL LOW (ref 6–20)
CO2: 20 mmol/L — ABNORMAL LOW (ref 22–32)
Calcium: 9.2 mg/dL (ref 8.9–10.3)
Chloride: 102 mmol/L (ref 98–111)
Creatinine, Ser: 0.57 mg/dL — ABNORMAL LOW (ref 0.61–1.24)
GFR, Estimated: >60 mL/min (ref >60)
Glucose, Bld: 143 mg/dL — ABNORMAL HIGH (ref 70–99)
Potassium: 3.6 mmol/L (ref 3.5–5.1)
Sodium: 135 mmol/L (ref 135–145)

## 2022-11-05 LAB — GLUCOSE, CAPILLARY
Glucose-Capillary: 164 mg/dL — ABNORMAL HIGH (ref 70–99)
Glucose-Capillary: 152 mg/dL — ABNORMAL HIGH (ref 70–99)
Glucose-Capillary: 163 mg/dL — ABNORMAL HIGH (ref 70–99)
Glucose-Capillary: 121 mg/dL — ABNORMAL HIGH (ref 70–99)

## 2022-11-05 LAB — PHOSPHORUS: Phosphorus: 5.3 mg/dL — ABNORMAL HIGH (ref 2.5–4.6)

## 2022-11-05 MED ORDER — MELATONIN 3 MG PO TABS
3.0000 mg | ORAL_TABLET | Freq: Once | ORAL | Status: DC | PRN
  Filled 2022-11-05: qty 1

## 2022-11-05 MED ORDER — MELATONIN 3 MG PO TABS
3.0000 mg | ORAL_TABLET | Freq: Once | ORAL | Status: AC
  Administered 2022-11-05: 3 mg
  Filled 2022-11-05: qty 1

## 2022-11-05 MED ORDER — FOOD THICKENER (SIMPLYTHICK): 5.0000 | ORAL | Status: DC | PRN

## 2022-11-05 MED ORDER — MELATONIN 3 MG PO TABS
3.0000 mg | ORAL_TABLET | Freq: Once | ORAL | Status: AC | PRN
  Administered 2022-11-05: 3 mg

## 2022-11-05 NOTE — Evaluation (Signed)
Physical Therapy Evaluation Patient Details Name: Stephen Beard MRN: [id number] DOB: [birth year] Today's Date: 11/05/2022  History of Present Illness  Stephen Beard is a 27 y.o. male who presents on 10/31/22 with seizure >30 mins.  EEG consistent with epilepsy and cortical dysfunction arising from R hemisphere, also moderate diffuse encephalopathy (same as EEG on admission a year ago). MRI showed trace SDH along the R parietal convexity. 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  Clinical Impression  Pt admitted with above diagnosis. Pt mildly confused and very apraxic on eval with PT/OT. Pt with visual and cognitive deficits making him very unsafe with ambulation, needed mod A +2 to ambulate in hallway. Pt unsteady in standing and fatigues quickly. Patient will benefit from intensive inpatient follow up therapy, >3 hours/day.  Pt currently with functional limitations due to the deficits listed below (see PT Problem List). Pt will benefit from acute skilled PT to increase their independence and safety with mobility to allow discharge.           If plan is discharge home, recommend the following: A lot of help with walking and/or transfers;A lot of help with bathing/dressing/bathroom;Assistance with cooking/housework;Assist for transportation;Help with stairs or ramp for entrance;Direct supervision/assist for financial management;Direct supervision/assist for medications management   Can travel by private vehicle        Equipment Recommendations Other (comment) (TBD)  Recommendations for Other Services  Rehab consult    Functional Status Assessment Patient has had a recent decline in their functional status and demonstrates the ability to make significant improvements in function in a reasonable and predictable amount of time.     Precautions /  Restrictions Precautions Precautions: Fall Precaution Comments: seizure, cortrak Restrictions Weight Bearing Restrictions: No      Mobility  Bed Mobility Overal bed mobility: Needs Assistance Bed Mobility: Supine to Sit     Supine to sit: Min assist     General bed mobility comments: significant multimodal cues needed for problem solving. When verbally told to come to EOB with hand gesture indicating to the L, pt began going to the R where the rail was up    Transfers Overall transfer level: Needs assistance Equipment used: Rolling walker (2 wheels), 1 person hand held assist Transfers: Sit to/from Stand Sit to Stand: Min assist           General transfer comment: RW used initally, pt does better with HHA to eliminate the need to problem solve RW management. posterior bias initially upon standing    Ambulation/Gait Ambulation/Gait assistance: Mod assist, +2 safety/equipment Gait Distance (Feet): 60 Feet Assistive device: Rolling walker (2 wheels), 1 person hand held assist Gait Pattern/deviations: Step-through pattern, Shuffle Gait velocity: decreased Gait velocity interpretation: <1.31 ft/sec, indicative of household ambulator   General Gait Details: pt with small, shuffling steps at first, gait lengthened slightly with distance. Pt running into door on L and could not problem solve how to get around it, finally needed manual facilitation. Also became "stuck" when reentering room and could not figure out how to ambulate around the bed to the chair  Stairs            Wheelchair Mobility     Tilt Bed    Modified Rankin (Stroke Patients Only)       Balance Overall balance assessment: Needs assistance Sitting-balance support: Feet supported Sitting balance-Leahy Scale: Fair     Standing balance  support: Single extremity supported, During functional activity Standing balance-Leahy Scale: Poor Standing balance comment: unsteady in standing and at times  startles which throws him off balance                             Pertinent Vitals/Pain Pain Assessment Pain Assessment: Faces Faces Pain Scale: Hurts a little bit Pain Location: grimacing with MMT Pain Descriptors / Indicators: Discomfort Pain Intervention(s): Limited activity within patient's tolerance    Home Living Family/patient expects to be discharged to:: Private residence Living Arrangements: Parent Available Help at Discharge: Family Type of Home: Apartment Home Access: Level entry       Home Layout: One level Home Equipment: Agricultural consultant (2 wheels) Additional Comments: lives with mom, step-dad and uncle with Down Syndrome    Prior Function Prior Level of Function : Needs assist             Mobility Comments: Denies recent falls ADLs Comments: Mom assists with IADLs     Extremity/Trunk Assessment   Upper Extremity Assessment Upper Extremity Assessment: Defer to OT evaluation RUE Deficits / Details: Overall WFL, sensation is WFL, however he is unable to feel both arms when touched at the same time LUE Deficits / Details: Overall WFL, sensation is [facility], however he is unable to feel both arms when touched at the same time    Lower Extremity Assessment Lower Extremity Assessment: LLE deficits/detail LLE Deficits / Details: LLE mildly weaker than R but no buckling or giving way with function, tests 4+/5. Has sensory deficits L side, lacking 2 point discrimination LLE Sensation: decreased light touch;decreased proprioception LLE Coordination: decreased gross motor    Cervical / Trunk Assessment Cervical / Trunk Assessment: Normal  Communication   Communication Communication: No apparent difficulties  Cognition Arousal: Alert Behavior During Therapy: Anxious Overall Cognitive Status: Impaired/Different from baseline Area of Impairment: Orientation, Attention, Memory, Following commands, Safety/judgement, Awareness, Problem solving, Rancho  level               Rancho Levels of Cognitive Functioning Rancho Los Amigos Scales of Cognitive Functioning: Confused, Appropriate: Moderate Assistance Orientation Level: Disoriented to, Situation Current Attention Level: Focused Memory: Decreased short-term memory Following Commands: Follows one step commands with increased time Safety/Judgement: Decreased awareness of safety, Decreased awareness of deficits Awareness: Intellectual Problem Solving: Slow processing, Decreased initiation, Difficulty sequencing, Requires verbal cues, Requires tactile cues General Comments: Unsure of situation, pt seemingly associating this admission with last (drug induced seizure). Pt with very poor attention, limited dual tasking ability and poor problem solving. Pt unable to path find a route from the door to the recliner chair and got "stuck" several times, requiring maximal assist to initiate movement   Rancho [employer] Scales of Cognitive Functioning: Confused, Appropriate: Moderate Assistance [VI]    General Comments General comments (skin integrity, edema, etc.): wears glasses all the time and did not have them today. Pt thinks he did drugs before this seizure but mom reassuring him that tox screen came back neg and he has not been anywhere or had people over and she has been with him. Doctor reassuring him that his brain is mixing up last admission with current events    Exercises     Assessment/Plan    PT Assessment Patient needs continued PT services  PT Problem List Decreased balance;Decreased cognition;Decreased activity tolerance;Decreased knowledge of use of DME;Decreased safety awareness;Decreased knowledge of precautions;Impaired sensation  PT Treatment Interventions DME instruction;Gait training;Stair training;Functional mobility training;Therapeutic activities;Therapeutic exercise;Balance training;Neuromuscular re-education;Cognitive remediation;Patient/family education     PT Goals (Current goals can be found in the Care Plan section)  Acute Rehab PT Goals Patient Stated Goal: return home PT Goal Formulation: With patient Time For Goal Achievement: 11/19/22 Potential to Achieve Goals: Good    Frequency Min 1X/week     Co-evaluation PT/OT/SLP Co-Evaluation/Treatment: Yes Reason for Co-Treatment: Complexity of the patient's impairments (multi-system involvement);Necessary to address cognition/behavior during functional activity PT goals addressed during session: Mobility/safety with mobility;Balance;Proper use of DME         AM-PAC PT "6 Clicks" Mobility  Outcome Measure Help needed turning from your back to your side while in a flat bed without using bedrails?: A Little Help needed moving from lying on your back to sitting on the side of a flat bed without using bedrails?: A Little Help needed moving to and from a bed to a chair (including a wheelchair)?: A Little Help needed standing up from a chair using your arms (e.g., wheelchair or bedside chair)?: A Little Help needed to walk in hospital room?: Total Help needed climbing 3-5 steps with a railing? : Total 6 Click Score: 14    End of Session Equipment Utilized During Treatment: Gait belt Activity Tolerance: Patient tolerated treatment well Patient left: in chair;with call bell/phone within reach;with family/visitor present;with chair alarm set Nurse Communication: Mobility status PT Visit Diagnosis: Unsteadiness on feet (R26.81);Apraxia (R48.2);History of falling (Z91.81);Other abnormalities of gait and mobility (R26.89)    Time: 1020-1107 PT Time Calculation (min) (ACUTE ONLY): 47 min   Charges:   PT Evaluation $PT Eval Moderate Complexity: 1 Mod   PT General Charges $$ ACUTE PT VISIT: 1 Visit         Lyanne Co, PT  Acute Rehab Services Secure chat preferred Office [phone]   Lawana Chambers Helton Oleson 11/05/2022, 12:10 PM

## 2022-11-05 NOTE — Progress Notes (Signed)
Inpatient Rehab Admissions Coordinator Note:   Per PT/OT patient was screened for CIR candidacy by Birdena Kingma Luvenia Starch, CCC-SLP. At this time, pt appears to be a potential candidate for CIR. If pt would like to be considered, please place an IP Rehab MD consult order.    Wolfgang Phoenix, MS, CCC-SLP Admissions Coordinator [phone] 11/05/22 4:51 PM

## 2022-11-05 NOTE — Progress Notes (Addendum)
LTM maint complete - no skin breakdown under Fp1 Fp2 Several leads serviced. [facility] monitored, Event button test confirmed by [facility].

## 2022-11-05 NOTE — Plan of Care (Signed)
  Problem: Education: Goal: Knowledge of General Education information will improve Description: Including pain rating scale, medication(s)/side effects and non-pharmacologic comfort measures Outcome: Progressing   Problem: Health Behavior/Discharge Planning: Goal: Ability to manage health-related needs will improve Outcome: Progressing   Problem: Clinical Measurements: Goal: Ability to maintain clinical measurements within normal limits will improve Outcome: Progressing   Problem: Activity: Goal: Risk for activity intolerance will decrease Outcome: Progressing   Problem: Safety: Goal: Non-violent Restraint(s) Outcome: Progressing

## 2022-11-05 NOTE — Procedures (Addendum)
Patient Name: ZAYLIN DYESS  MRN: [id number]  Epilepsy Attending: Charlsie Quest  Referring Physician/Provider: Milon Dikes, MD  Duration: 11/04/2022 2303 to 11/05/2022 0831   Patient history: 27 year old with past medical history of anaplastic oligodendroglioma in the right parieto-occipital region status post resection/debulking complicated by intracerebral hematoma, status post chemotherapy, history of seizures on lamotrigine now with breakthrough seizure lasting 30 to 40 minutes with concern for status epilepticus. EEG to evaluate for seizure   Level of alertness: awake, asleep   AEDs during EEG study: LCM, LTG   Technical aspects: This EEG study was done with scalp electrodes positioned according to the 10-20 International system of electrode placement. Electrical activity was reviewed with band pass filter of 1-70Hz , sensitivity of 7 uV/mm, display speed of 3mm/sec with a 60Hz  notched filter applied as appropriate. EEG data were recorded continuously and digitally stored.  Video monitoring was available and reviewed as appropriate.   Description: EEG showed posterior dominant rhythm of 8-9 Hz activity of moderate voltage (25-35 uV) seen predominantly in posterior head regions, symmetric and reactive to eye opening and eye closing. Sleep was characterized by vertex waves, sleep spindles (12 to 14 Hz), maximal frontocentral region. Intermittent generalized and maximal right parieto-occipital region 3 to 5 Hz theta-delta slowing was also noted. Sharp waves were noted in right parieto-occipital region.  Hyperventilation and photic stimulation were not performed.      ABNORMALITY - Sharp waves, right parieto-occipital region  - Intermittent slow, generalized and maximal right parieto-occipital region   IMPRESSION: This study was suggestive of epileptogenicity and cortical dysfunction in right parieto- occipital region likely secondary to underlying structural abnormality. Additionally  there is mild diffuse encephalopathy. No seizures were seen during the study.   Luis Nickles Annabelle Harman

## 2022-11-05 NOTE — Plan of Care (Signed)
  Problem: Education: Goal: Ability to describe self-care measures that may prevent or decrease complications (Diabetes Survival Skills Education) will improve Outcome: Progressing Goal: Individualized Educational Video(s) Outcome: Progressing   Problem: Coping: Goal: Ability to adjust to condition or change in health will improve Outcome: Progressing   Problem: Fluid Volume: Goal: Ability to maintain a balanced intake and output will improve Outcome: Progressing   Problem: Health Behavior/Discharge Planning: Goal: Ability to identify and utilize available resources and services will improve Outcome: Progressing Goal: Ability to manage health-related needs will improve Outcome: Progressing   Problem: Metabolic: Goal: Ability to maintain appropriate glucose levels will improve Outcome: Progressing   Problem: Nutritional: Goal: Maintenance of adequate nutrition will improve Outcome: Progressing Goal: Progress toward achieving an optimal weight will improve Outcome: Progressing   Problem: Skin Integrity: Goal: Risk for impaired skin integrity will decrease Outcome: Progressing   Problem: Education: Goal: Knowledge of General Education information will improve Description: Including pain rating scale, medication(s)/side effects and non-pharmacologic comfort measures Outcome: Progressing   Problem: Tissue Perfusion: Goal: Adequacy of tissue perfusion will improve Outcome: Progressing   Problem: Clinical Measurements: Goal: Ability to maintain clinical measurements within normal limits will improve Outcome: Progressing Goal: Will remain free from infection Outcome: Progressing Goal: Diagnostic test results will improve Outcome: Progressing Goal: Respiratory complications will improve Outcome: Progressing Goal: Cardiovascular complication will be avoided Outcome: Progressing   Problem: Health Behavior/Discharge Planning: Goal: Ability to manage health-related needs  will improve Outcome: Progressing   Problem: Activity: Goal: Risk for activity intolerance will decrease Outcome: Progressing   Problem: Elimination: Goal: Will not experience complications related to bowel motility Outcome: Progressing Goal: Will not experience complications related to urinary retention Outcome: Progressing   Problem: Coping: Goal: Level of anxiety will decrease Outcome: Progressing   Problem: Pain Managment: Goal: General experience of comfort will improve Outcome: Progressing   Problem: Activity: Goal: Ability to tolerate increased activity will improve Outcome: Progressing   Problem: Respiratory: Goal: Ability to maintain a clear airway and adequate ventilation will improve Outcome: Progressing   Problem: Safety: Goal: Non-violent Restraint(s) Outcome: Progressing

## 2022-11-05 NOTE — Progress Notes (Signed)
LTM EEG discontinued - no skin breakdown at unhook.   

## 2022-11-05 NOTE — Evaluation (Signed)
Clinical/Bedside Swallow Evaluation Patient Details  Name: Stephen Beard MRN: [id number] Date of Birth: Mar 15, 1995  Today's Date: 11/05/2022 Time: SLP Start Time (ACUTE ONLY): 0935 SLP Stop Time (ACUTE ONLY): 0952 SLP Time Calculation (min) (ACUTE ONLY): 17 min  Past Medical History:  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HCC)    Headache    Psychotic disorder (HCC)    Seizures (HCC)    Past Surgical History:  Past Surgical History:  Procedure Laterality Date   APPLICATION OF CRANIAL NAVIGATION N/A 07/02/2019   Procedure: APPLICATION OF CRANIAL NAVIGATION;  Surgeon: Jadene Pierini, MD;  Location: [facility] OR;  Service: Neurosurgery;  Laterality: N/A;   CRANIOTOMY N/A 07/03/2019   Procedure: CRANIOTOMY HEMATOMA EVACUATION SUBDURAL;  Surgeon: Jadene Pierini, MD;  Location: [facility] OR;  Service: Neurosurgery;  Laterality: N/A;   CRANIOTOMY Right 07/03/2019   Procedure: CRANIOTOMY HEMATOMA EVACUATION SUBDURAL;  Surgeon: Jadene Pierini, MD;  Location: [facility] OR;  Service: Neurosurgery;  Laterality: Right;   CRANIOTOMY Right 07/02/2019   Procedure: CRANIOTOMY TUMOR EXCISION with Normajean Glasgow;  Surgeon: Jadene Pierini, MD;  Location: [facility] OR;  Service: Neurosurgery;  Laterality: Right;  posterior   HPI:  27 year old male with past medical history as below, which is significant for anaplastic oligodendroglioma s/p resection and chemotherapy.  He has had seizures since that time which have not been managed with lamotrigine.  In the evening hours of 8/20 he suffered a seizure at home which went on for 15 minutes before the arrival of EMS.  He was treated with benzodiazepines by EMS without improvement.  He continued to seize upon arrival to the emergency department he was treated with additional benzodiazepines and IV Keppra bolus.  Duration of seizure activity estimated at 30 minutes.  Seizures did then eventually break,  but the patient was unresponsive and desaturating requiring intubation in the emergency department. Pt was intubated 8/19-8/23.  EEG suggestive of epileptogenicity and cortical dysfunction in right parieto- occipital region likely secondary to underlying structural abnormality.    Assessment / Plan / Recommendation  Clinical Impression  Pt was seen for a clinical swallow evaluation and presents with suspected reversible oropharyngeal dysphagia in the setting of recent intubation.  Pt was encountered awake/alert with mother and step-father present at bedside with cortrak in place. Oral mechanism examination was WNL.  Pt was noted to have a mildly hoarse vocal quality which pt and mother reported was different from the pt's baseline.  He was additionally noted to have an intermittent baseline cough which pt and family reported was present prior to hospital admission and was likely secondary to smoking.  Pt consumed trials of ice chips, thin liquid, nectar-thick liquid, puree, and regular solids.  He exhibited immediate coughing and throat clearing with thin liquid and an immediate cough x1 with regular solids.  Mastication was timely with no oral residue observed.  Recommend initiation of Dysphagia 1 (puree) solids and nectar-thick liquids with medication administered whole in puree (cut/crush large pills).  Would additionally recommend continuation of cortrak at this time until SLP is able to reassess for diet tolerance in the next 24-48 hours.   SLP Visit Diagnosis: Dysphagia, unspecified (R13.10)    Aspiration Risk  Mild aspiration risk    Diet Recommendation Dysphagia 1 (Puree);Nectar-thick liquid;Alternative means - temporary    Liquid Administration via: Cup;Straw Medication Administration: Whole meds with puree Supervision: Patient able to self feed;Intermittent supervision to  cue for compensatory strategies Compensations: Minimize environmental distractions;Slow rate;Small sips/bites     Other  Recommendations Oral Care Recommendations: Oral care BID Caregiver Recommendations: Avoid jello, ice cream, thin soups, popsicles;Remove water pitcher    Recommendations for follow up therapy are one component of a multi-disciplinary discharge planning process, led by the attending physician.  Recommendations may be updated based on patient status, additional functional criteria and insurance authorization.  Follow up Recommendations Follow physician's recommendations for discharge plan and follow up therapies      Assistance Recommended at Discharge    Functional Status Assessment Patient has had a recent decline in their functional status and demonstrates the ability to make significant improvements in function in a reasonable and predictable amount of time.  Frequency and Duration min 2x/week  2 weeks       Prognosis Prognosis for improved oropharyngeal function: Good      Swallow Study   General HPI: 27 year old male with past medical history as below, which is significant for anaplastic oligodendroglioma s/p resection and chemotherapy.  He has had seizures since that time which have not been managed with lamotrigine.  In the evening hours of 8/20 he suffered a seizure at home which went on for 15 minutes before the arrival of EMS.  He was treated with benzodiazepines by EMS without improvement.  He continued to seize upon arrival to the emergency department he was treated with additional benzodiazepines and IV Keppra bolus.  Duration of seizure activity estimated at 30 minutes.  Seizures did then eventually break, but the patient was unresponsive and desaturating requiring intubation in the emergency department. Pt was intubated 8/19-8/23.  EEG suggestive of epileptogenicity and cortical dysfunction in right parieto- occipital region likely secondary to underlying structural abnormality. Type of Study: Bedside Swallow Evaluation Previous Swallow Assessment: BSE 07/05/19 Diet  Prior to this Study: NPO;Cortrak/Small bore NG tube Temperature Spikes Noted: Yes Respiratory Status: Room air History of Recent Intubation: Yes Total duration of intubation (days): 4 days Date extubated: 11/04/22 Behavior/Cognition: Alert;Cooperative;Confused;Pleasant mood;Distractible Oral Cavity Assessment: Within Functional Limits Oral Care Completed by SLP: No Oral Cavity - Dentition: Adequate natural dentition Vision: Functional for self-feeding Self-Feeding Abilities: Able to feed self Patient Positioning: Upright in bed Baseline Vocal Quality: Hoarse Volitional Cough: Strong Volitional Swallow: Able to elicit    Oral/Motor/Sensory Function Overall Oral Motor/Sensory Function: Within functional limits   Ice Chips Ice chips: Within functional limits Presentation: Spoon   Thin Liquid Thin Liquid: Impaired Presentation: Cup;Spoon;Straw Pharyngeal  Phase Impairments: Cough - Immediate;Throat Clearing - Immediate    Nectar Thick Nectar Thick Liquid: Within functional limits Presentation: Cup;Spoon;Straw;Self Fed   Honey Thick Honey Thick Liquid: Not tested   Puree Puree: Within functional limits Presentation: Spoon;Self Fed   Solid     Solid: Impaired Presentation: Self Fed Pharyngeal Phase Impairments: Cough - Immediate     Eino Farber, M.S., CCC-SLP Acute Rehabilitation Services Office: [phone]  Shanon Rosser Elad Macphail 11/05/2022,10:11 AM

## 2022-11-05 NOTE — Progress Notes (Signed)
   NAME:  Stephen Beard, MRN:  [id number], DOB:  Feb 16, 1996, LOS: 4 ADMISSION DATE:  10/31/2022, CONSULTATION DATE: 8/20 REFERRING MD:  Dr Suezanne Jacquet, CHIEF COMPLAINT: Status epilepticus  History of Present Illness:  27 year old male with past medical history as below, which is significant for anaplastic oligodendroglioma s/p resection and chemotherapy.  He has had seizures since that time which have not been managed with lamotrigine.  In the evening hours of 8/20 he suffered a seizure at home which went on for 15 minutes before the arrival of EMS.  He was treated with benzodiazepines by EMS without improvement.  He continued to seize upon arrival to the emergency department he was treated with additional benzodiazepines and IV Keppra bolus.  Duration of seizure activity estimated at 30 minutes.  Seizures did then eventually break, but the patient was unresponsive and desaturating requiring intubation in the emergency department.  He was evaluated by neurology in the emergency department who recommended additional Keppra, EEG, and MRI.  PCCM was asked to evaluate the patient for admission due to intubated status.  Pertinent  Medical History   has a past medical history of ADD (attention deficit disorder), ADHD, Bifrontal oligodendroglioma (HCC), Headache, Psychotic disorder (HCC), and Seizures (HCC).  Significant Hospital Events: Including procedures, antibiotic start and stop dates in addition to other pertinent events   8/23 Extubated 8/24 LTM discontinued  Interim History / Subjective:  Denies headache.  Breathing okay.  Objective   Blood pressure 137/87, pulse 83, temperature 99.2 F (37.3 C), resp. rate 15, height 5\' 8"  (1.727 m), weight 61.9 kg, SpO2 100%.    Vent Mode: PSV;CPAP FiO2 (%):  [40 %] 40 % PEEP:  [5 cmH20] 5 cmH20 Pressure Support:  [10 cmH20] 10 cmH20   Intake/Output Summary (Last 24 hours) at 11/05/2022 0955 Last data filed at 11/05/2022 0700 Gross per 24 hour  Intake  1884.42 ml  Output 2590 ml  Net -705.58 ml   Filed Weights   11/03/22 0500 11/04/22 0500 11/05/22 0500  Weight: 64.8 kg 64.2 kg 61.9 kg    Examination:  General - alert Eyes - pupils reactive ENT - no sinus tenderness, no stridor Cardiac - regular rate/rhythm, no murmur Chest - equal breath sounds b/l, no wheezing or rales Abdomen - soft, non tender, + bowel sounds Extremities - no cyanosis, clubbing, or edema Skin - no rashes Neuro - normal strength, moves extremities, follows commands Psych - normal mood and behavior  Resolved Hospital Problem list   AKI, compromised airway, elevate LFTs in setting of seizure  Assessment & Plan:   Status epilepticus Hx anaplastic oligodendroglioma s/p resection and chemotherapy - AEDs per neuro  Aspiration pna - on unasyn >> d/c after dosing on 8/23 to complete 5 day course of therapy  Hypokalemia - f/u BMET intermittently  Dysphagia. - follow up with speech therapy  Transfer to medical telemetry 8/24.  Will ask [facility] to assume care from 8/25 and PCCM off.  Updated family at bedside  Signature:  Coralyn Helling, MD [facility] Pulmonary/Critical Care Pager - [phone] or [phone] 11/05/2022, 9:55 AM

## 2022-11-05 NOTE — Evaluation (Signed)
Occupational Therapy Evaluation Patient Details Name: Stephen Beard MRN: [id number] DOB: [birth year] Today's Date: 11/05/2022   History of Present Illness Stephen Beard is a 27 y.o. male who presents on 10/31/22 with seizure >30 mins.  EEG consistent with epilepsy and cortical dysfunction arising from R hemisphere, also moderate diffuse encephalopathy (same as EEG on admission a year ago). MRI showed trace SDH along the R parietal convexity. 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   Clinical Impression   Stephen Beard was evaluated s/p the above admission list. He lives with his mom who assists with IADLs at baseline, per mom pt does not leave the home often. Upon evaluation the pt was limited by impaired cognition, blurred vision (no glasses acutely), weakness, ataxic gait, poor insight to deficits/situation and safety, and unsteady gait. Overall he needed up to mod A for mobility with HHA and significant cues for initiation, path-finding, safety and awareness. Due to the deficits listed below the pt also needs up to mod A for ADLs. Pt will benefit from further cognitive and visual assessment. Pt will benefit from continued acute OT services and intensive inpatient follow up therapy, >3 hours/day after discharge.         If plan is discharge home, recommend the following: A lot of help with walking and/or transfers;A lot of help with bathing/dressing/bathroom;Assistance with cooking/housework;Direct supervision/assist for medications management;Direct supervision/assist for financial management;Assist for transportation;Help with stairs or ramp for entrance    Functional Status Assessment  Patient has had a recent decline in their functional status and demonstrates the ability to make significant improvements in function in a reasonable and predictable amount of  time.  Equipment Recommendations  Other (comment) (defer)    Recommendations for Other Services Rehab consult     Precautions / Restrictions Precautions Precautions: Fall Precaution Comments: seizure, cortrak Restrictions Weight Bearing Restrictions: No      Mobility Bed Mobility Overal bed mobility: Needs Assistance Bed Mobility: Supine to Sit     Supine to sit: Min assist     General bed mobility comments: significant cues needed for problem solving    Transfers Overall transfer level: Needs assistance Equipment used: Rolling walker (2 wheels), 1 person hand held assist Transfers: Sit to/from Stand Sit to Stand: Min assist           General transfer comment: RW used initally, pt does better with HHA to eliminate the need to problem solve RW management. posterior bias initially upon standing      Balance Overall balance assessment: Needs assistance Sitting-balance support: Feet supported Sitting balance-Leahy Scale: Fair     Standing balance support: Single extremity supported, During functional activity Standing balance-Leahy Scale: Poor                             ADL either performed or assessed with clinical judgement   ADL Overall ADL's : Needs assistance/impaired Eating/Feeding: Moderate assistance Eating/Feeding Details (indicate cue type and reason): cortrak, nectar liquids Grooming: Minimal assistance;Standing   Upper Body Bathing: Minimal assistance;Sitting   Lower Body Bathing: Moderate assistance;Sit to/from stand   Upper Body Dressing : Minimal assistance;Sitting   Lower Body Dressing: Moderate assistance;Sit to/from stand   Toilet Transfer: Moderate assistance;Ambulation   Toileting- Clothing Manipulation and Hygiene: Minimal assistance;Sit to/from stand       Functional mobility during ADLs: Moderate assistance;Cueing for safety;Cueing for sequencing  General ADL Comments: assist for balance and cognition      Vision Baseline Vision/History: 1 Wears glasses Patient Visual Report: Blurring of vision Vision Assessment?: Vision impaired- to be further tested in functional context Additional Comments: does not have glasses acutely, vision seems [facility]. limited assessment due to cognition. Symptoms seem to point towards impaired attention     Perception Perception: Not tested       Praxis Praxis: Not tested       Pertinent Vitals/Pain Pain Assessment Pain Assessment: Faces Faces Pain Scale: Hurts a little bit Pain Location: grimacing with MMT Pain Descriptors / Indicators: Discomfort Pain Intervention(s): Limited activity within patient's tolerance, Monitored during session     Extremity/Trunk Assessment Upper Extremity Assessment Upper Extremity Assessment: Generalized weakness;RUE deficits/detail;LUE deficits/detail;Right hand dominant RUE Deficits / Details: Overall WFL, sensation is WFL, however he is unable to feel both arms when touched at the same time LUE Deficits / Details: Overall WFL, sensation is WFL, however he is unable to feel both arms when touched at the same time   Lower Extremity Assessment Lower Extremity Assessment: Defer to PT evaluation   Cervical / Trunk Assessment Cervical / Trunk Assessment: Normal   Communication Communication Communication: No apparent difficulties   Cognition Arousal: Alert Behavior During Therapy: Anxious Overall Cognitive Status: Impaired/Different from baseline Area of Impairment: Orientation, Attention, Memory, Following commands, Safety/judgement, Awareness, Problem solving, Rancho level               Rancho Levels of Cognitive Functioning Rancho Los Amigos Scales of Cognitive Functioning: Confused, Appropriate: Moderate Assistance Orientation Level: Disoriented to, Situation Current Attention Level: Focused Memory: Decreased short-term memory Following Commands: Follows one step commands with increased  time Safety/Judgement: Decreased awareness of safety, Decreased awareness of deficits Awareness: Intellectual Problem Solving: Slow processing, Decreased initiation, Difficulty sequencing, Requires verbal cues, Requires tactile cues General Comments: Unsure of situation, pt seemingly associating this admission with last (drug induced seizure). Pt with very poor attention, limited dual tasking ability and poor problem solving. Pt unable to path find a route from the door to the recliner chair and got "stuck" several times, requiring maximal assist to initiate movement Rancho Mirant Scales of Cognitive Functioning: Confused, Appropriate: Moderate Assistance [VI]   General Comments  VSS, HR up to 124    Exercises     Shoulder Instructions      Home Living Family/patient expects to be discharged to:: Private residence Living Arrangements: Parent Available Help at Discharge: Family Type of Home: Apartment Home Access: Level entry     Home Layout: One level     Bathroom Shower/Tub: Chief Strategy Officer: Standard Bathroom Accessibility: No   Home Equipment: Agricultural consultant (2 wheels)   Additional Comments: lives with mom, step-dad and uncle with Down Syndrome      Prior Functioning/Environment Prior Level of Function : Needs assist             Mobility Comments: Denies recent falls ADLs Comments: Mom assists with IADLs        OT Problem List: Decreased strength;Decreased range of motion;Decreased activity tolerance;Impaired balance (sitting and/or standing);Decreased safety awareness;Decreased knowledge of use of DME or AE;Decreased knowledge of precautions;Impaired vision/perception;Decreased cognition      OT Treatment/Interventions: Self-care/ADL training;Therapeutic exercise;DME and/or AE instruction;Therapeutic activities;Cognitive remediation/compensation;Patient/family education;Balance training    OT Goals(Current goals can be found in the care  plan section) Acute Rehab OT Goals Patient Stated Goal: to get better OT Goal Formulation: With patient Time For  Goal Achievement: 11/19/22 Potential to Achieve Goals: Good ADL Goals Pt Will Perform Grooming: with supervision;standing Pt Will Perform Upper Body Dressing: with modified independence Pt Will Transfer to Toilet: with supervision;ambulating Additional ADL Goal #1: pt will complete 3 step way finding task with min cues as a precursor to higher level IADLs Additional ADL Goal #2: Pt will indep sequence an ADL task  OT Frequency: Min 1X/week    Co-evaluation              AM-PAC OT "6 Clicks" Daily Activity     Outcome Measure Help from another person eating meals?: A Little Help from another person taking care of personal grooming?: A Little Help from another person toileting, which includes using toliet, bedpan, or urinal?: A Lot Help from another person bathing (including washing, rinsing, drying)?: A Lot Help from another person to put on and taking off regular upper body clothing?: A Little Help from another person to put on and taking off regular lower body clothing?: A Lot 6 Click Score: 15   End of Session Equipment Utilized During Treatment: Gait belt;Rolling walker (2 wheels) Nurse Communication: Mobility status  Activity Tolerance: Patient tolerated treatment well Patient left: in chair;with call bell/phone within reach;with family/visitor present  OT Visit Diagnosis: Unsteadiness on feet (R26.81);Other abnormalities of gait and mobility (R26.89);Muscle weakness (generalized) (M62.81);Other symptoms and signs involving cognitive function                Time: 1017-1100 OT Time Calculation (min): 43 min Charges:  OT General Charges $OT Visit: 1 Visit OT Evaluation $OT Eval Moderate Complexity: 1 Mod OT Treatments $Therapeutic Activity: 8-22 mins  Derenda Mis, OTR/L Acute Rehabilitation Services Office [phone] Secure Chat Communication  Preferred   Donia Pounds 11/05/2022, 11:22 AM

## 2022-11-05 NOTE — Progress Notes (Signed)
eLink Physician-Brief Progress Note Patient Name: KYHEEM BOULOS DOB: [birth year] MRN: [id number]   Date of Service  11/05/2022  HPI/Events of Note  Got melatonin and did well last night. Requesttng repeat dose.   eICU Interventions  Order placed     Intervention Category Intermediate Interventions: Other:  Oretha Milch 11/05/2022, 10:12 PM

## 2022-11-05 NOTE — Progress Notes (Signed)
Subjective: Awake and alert this morning. Sitting in recliner.   Objective: Current vital signs: BP 109/77   Pulse 70   Temp 99.2 F (37.3 C)   Resp 18   Ht 5\' 8"  (1.727 m)   Wt 61.9 kg   SpO2 100%   BMI 20.75 kg/m  Vital signs in last 24 hours: Temp:  [98.3 F (36.8 C)-99.9 F (37.7 C)] 99.2 F (37.3 C) (08/24 0800) Pulse Rate:  [70-100] 70 (08/24 1000) Resp:  [12-31] 18 (08/24 1000) BP: (109-144)/(77-101) 109/77 (08/24 1000) SpO2:  [93 %-100 %] 100 % (08/24 1000) Weight:  [61.9 kg] 61.9 kg (08/24 0500)  Intake/Output from previous day: 08/23 0701 - 08/24 0700 In: 2119.4 [I.V.:209.5; NG/GT:1440; IV Piggyback:469.9] Out: 2890 [Urine:2890] Intake/Output this shift: No intake/output data recorded. Nutritional status:  Diet Order             DIET - DYS 1 Room service appropriate? Yes with Assist; Fluid consistency: Nectar Thick  Diet effective now                    HEENT: Normocephalic Lungs: Respirations unlabored Ext: No edema  Neurologic Exam: Mental Status: Awake and alert. Oriented to place and situation. Speech fluent but slow with increased latencies of verbal and motor responses.   Cranial Nerves: II: Temporal visual fields without extinction to DSS. During PT exam however, the patient bumped his left-side into the doorjamb while ambulating.  III,IV, VI: No ptosis. EOMI with saccadic visual pursuits. No nystagmus.  V: Temp sensation equal bilaterally VII: Smile symmetric VIII: Hearing intact to conversation IX,X: No hoarseness or hypophonia XI: Symmetric Motor: RUE 5/5 LUE 4+/5 with movements that are less coordinated and slower than on the right RLE 5/5 LLE 4+/5 Subtle drift to LUE when testing Barre Sensory: Light touch intact throughout, bilaterally, but with extinction to DSS on the LUE Deep Tendon Reflexes: 1+ and symmetric throughout Cerebellar: No ataxia with FNF on the right. Mild dysmetria with FNF on the left Gait: Apraxic  gait   Lab Results: Results for orders placed or performed during the hospital encounter of 10/31/22 (from the past 48 hour(s))  Glucose, capillary     Status: None   Collection Time: 11/03/22 11:59 AM  Result Value Ref Range   Glucose-Capillary 87 70 - 99 mg/dL    Comment: Glucose reference range applies only to samples taken after fasting for at least 8 hours.  Glucose, capillary     Status: Abnormal   Collection Time: 11/03/22  3:58 PM  Result Value Ref Range   Glucose-Capillary 102 (H) 70 - 99 mg/dL    Comment: Glucose reference range applies only to samples taken after fasting for at least 8 hours.  Magnesium     Status: None   Collection Time: 11/03/22  5:57 PM  Result Value Ref Range   Magnesium 2.4 1.7 - 2.4 mg/dL    Comment: Performed at [facility], [address] [place], [place] [address]  Phosphorus     Status: None   Collection Time: 11/03/22  5:57 PM  Result Value Ref Range   Phosphorus 2.6 2.5 - 4.6 mg/dL    Comment: Performed at [facility], [address] [place], [place] [address]  Glucose, capillary     Status: Abnormal   Collection Time: 11/03/22  7:54 PM  Result Value Ref Range   Glucose-Capillary 130 (H) 70 - 99 mg/dL    Comment: Glucose reference range applies  only to samples taken after fasting for at least 8 hours.  Glucose, capillary     Status: Abnormal   Collection Time: 11/04/22 12:48 AM  Result Value Ref Range   Glucose-Capillary 139 (H) 70 - 99 mg/dL    Comment: Glucose reference range applies only to samples taken after fasting for at least 8 hours.   Comment 1 Notify RN    Comment 2 Document in Chart   Glucose, capillary     Status: Abnormal   Collection Time: 11/04/22  4:37 AM  Result Value Ref Range   Glucose-Capillary 130 (H) 70 - 99 mg/dL    Comment: Glucose reference range applies only to samples taken after fasting for at least 8 hours.   Comment 1 Notify RN    Comment 2 Document in Chart   Glucose, capillary      Status: Abnormal   Collection Time: 11/04/22  7:34 AM  Result Value Ref Range   Glucose-Capillary 112 (H) 70 - 99 mg/dL    Comment: Glucose reference range applies only to samples taken after fasting for at least 8 hours.  Glucose, capillary     Status: Abnormal   Collection Time: 11/04/22 11:40 AM  Result Value Ref Range   Glucose-Capillary 108 (H) 70 - 99 mg/dL    Comment: Glucose reference range applies only to samples taken after fasting for at least 8 hours.  Glucose, capillary     Status: Abnormal   Collection Time: 11/04/22  3:37 PM  Result Value Ref Range   Glucose-Capillary 120 (H) 70 - 99 mg/dL    Comment: Glucose reference range applies only to samples taken after fasting for at least 8 hours.  Glucose, capillary     Status: Abnormal   Collection Time: 11/04/22  8:03 PM  Result Value Ref Range   Glucose-Capillary 142 (H) 70 - 99 mg/dL    Comment: Glucose reference range applies only to samples taken after fasting for at least 8 hours.  Glucose, capillary     Status: Abnormal   Collection Time: 11/04/22 11:22 PM  Result Value Ref Range   Glucose-Capillary 134 (H) 70 - 99 mg/dL    Comment: Glucose reference range applies only to samples taken after fasting for at least 8 hours.  Glucose, capillary     Status: Abnormal   Collection Time: 11/05/22  3:08 AM  Result Value Ref Range   Glucose-Capillary 164 (H) 70 - 99 mg/dL    Comment: Glucose reference range applies only to samples taken after fasting for at least 8 hours.  Basic metabolic panel     Status: Abnormal   Collection Time: 11/05/22  5:04 AM  Result Value Ref Range   Sodium 135 135 - 145 mmol/L   Potassium 3.6 3.5 - 5.1 mmol/L   Chloride 102 98 - 111 mmol/L   CO2 20 (L) 22 - 32 mmol/L   Glucose, Bld 143 (H) 70 - 99 mg/dL    Comment: Glucose reference range applies only to samples taken after fasting for at least 8 hours.   BUN <5 (L) 6 - 20 mg/dL   Creatinine, Ser 1.61 (L) 0.61 - 1.24 mg/dL   Calcium 9.2 8.9 -  09.6 mg/dL   GFR, Estimated >04 >54 mL/min    Comment: (NOTE) Calculated using the CKD-EPI Creatinine Equation (2021)    Anion gap 13 5 - 15    Comment: Performed at [facility], [address], [place], [place] [address]  CBC  Status: Abnormal   Collection Time: 11/05/22  5:04 AM  Result Value Ref Range   WBC 6.5 4.0 - 10.5 K/uL   RBC 4.38 4.22 - 5.81 MIL/uL   Hemoglobin 15.0 13.0 - 17.0 g/dL   HCT 60.4 54.0 - 98.1 %   MCV 94.7 80.0 - 100.0 fL   MCH 34.2 (H) 26.0 - 34.0 pg   MCHC 36.1 (H) 30.0 - 36.0 g/dL   RDW 19.1 47.8 - 29.5 %   Platelets 143 (L) 150 - 400 K/uL   nRBC 0.0 0.0 - 0.2 %    Comment: Performed at [facility], [address] [place], [place] [address]  Magnesium     Status: None   Collection Time: 11/05/22  5:04 AM  Result Value Ref Range   Magnesium 2.0 1.7 - 2.4 mg/dL    Comment: Performed at [facility], [address], [place], [place] [address]  Phosphorus     Status: Abnormal   Collection Time: 11/05/22  5:04 AM  Result Value Ref Range   Phosphorus 5.3 (H) 2.5 - 4.6 mg/dL    Comment: Performed at [facility], [address], [place], [place] [address]  Glucose, capillary     Status: Abnormal   Collection Time: 11/05/22  7:16 AM  Result Value Ref Range   Glucose-Capillary 152 (H) 70 - 99 mg/dL    Comment: Glucose reference range applies only to samples taken after fasting for at least 8 hours.    Recent Results (from the past 240 hour(s))  Culture, blood (routine x 2)     Status: None (Preliminary result)   Collection Time: 11/01/22  1:13 AM   Specimen: BLOOD RIGHT HAND  Result Value Ref Range Status   Specimen Description BLOOD RIGHT HAND  Final   Special Requests   Final    BOTTLES DRAWN AEROBIC AND ANAEROBIC Blood Culture adequate volume   Culture   Final    NO GROWTH 4 DAYS Performed at [facility], [address], [place], [place] [address]    Report Status PENDING  Incomplete  Culture, blood (routine x  2)     Status: None (Preliminary result)   Collection Time: 11/01/22  1:13 AM   Specimen: BLOOD LEFT HAND  Result Value Ref Range Status   Specimen Description BLOOD LEFT HAND  Final   Special Requests   Final    BOTTLES DRAWN AEROBIC AND ANAEROBIC Blood Culture adequate volume   Culture   Final    NO GROWTH 4 DAYS Performed at [facility], [address], [place], [place] [address]    Report Status PENDING  Incomplete  MRSA Next Gen by PCR, Nasal     Status: None   Collection Time: 11/02/22  9:45 AM   Specimen: Nasal Mucosa; Nasal Swab  Result Value Ref Range Status   MRSA by PCR Next Gen NOT DETECTED NOT DETECTED Final    Comment: (NOTE) The GeneXpert MRSA Assay (FDA approved for NASAL specimens only), is one component of a comprehensive MRSA colonization surveillance program. It is not intended to diagnose MRSA infection nor to guide or monitor treatment for MRSA infections. Test performance is not FDA approved in patients less than 66 years old. Performed at [facility], [address], [place], [place] [address]     Lipid Panel No results for input(s): "CHOL", "TRIG", "HDL", "CHOLHDL", "VLDL", "LDLCALC" in the last 72 hours.  Studies/Results: DG Abd  Portable 1V  Result Date: 11/04/2022 CLINICAL DATA:  Encounter for feeding tube placement. EXAM: PORTABLE ABDOMEN - 1 VIEW COMPARISON:  Upper abdominal radiograph 11/01/2022 FINDINGS: Interval removal of the prior nasogastric tube and interval placement of weighted tip enteric tube. The tip overlies the distal gastric antrum/pylorus. Nonobstructive bowel-gas pattern. IMPRESSION: Weighted tip enteric tube tip overlies the distal gastric antrum/pylorus. Electronically Signed   By: Neita Garnet M.D.   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 - Exam today reveals left sided incoordination and extinction to DSS on the left, as well as gait apraxia - LTM EEG report for today AM: Sharp waves, right parieto-occipital region; Intermittent slow, generalized and maximal right parieto-occipital region. This study was suggestive of epileptogenicity and cortical dysfunction in right parieto- occipital region likely secondary to underlying structural abnormality. Additionally there is mild diffuse encephalopathy. No seizures were seen during the study. - Overall impression: Epilepsy with breakthrough seizure - Lamotrigine level was less than 1 on arrival.  Therefore likely etiology of breakthrough seizures medication noncompliance. The patient states this AM that he was overall compliant, only missing doses from time to time, but also may be having some false memory of recent events based on interview concomitantly with mother.    Recommendations: -Continue lamotrigine 250 mg twice daily. -Added Vimpat 100 mg twice daily during this hospitalization.  However if patient was not compliant with lamotrigine, then might be able to discontinue this.  -Will obtain repeat lamotrigine level to assess whether he is a fast metabolizer.  -Rescue medication: Recommend intranasal Valtoco 15 mg for clinical seizure lasting more than 2 minutes.  Of note patient will need a new prescription for this -LTM EEG has been  discontinued.  -Will need education about medication compliance and seizure provoking factors once patient is extubated -Continue seizure precautions -As needed IV Ativan for clinical seizures while inpatient -Continue to follow-up with Dr. Barbaraann Cao.  -Discussed plan with family at bedside   LOS: 4 days   @Electronically  signed: Dr. Caryl Pina 11/05/2022  11:37 AM

## 2022-11-06 DIAGNOSIS — G40901 Epilepsy, unspecified, not intractable, with status epilepticus: Secondary | ICD-10-CM | POA: Diagnosis not present

## 2022-11-06 LAB — CULTURE, BLOOD (ROUTINE X 2)
Culture: NO GROWTH
Culture: NO GROWTH
Special Requests: ADEQUATE
Special Requests: ADEQUATE

## 2022-11-06 LAB — GLUCOSE, CAPILLARY
Glucose-Capillary: 143 mg/dL — ABNORMAL HIGH (ref 70–99)
Glucose-Capillary: 114 mg/dL — ABNORMAL HIGH (ref 70–99)
Glucose-Capillary: 110 mg/dL — ABNORMAL HIGH (ref 70–99)

## 2022-11-06 MED ORDER — MELATONIN 3 MG PO TABS
3.0000 mg | ORAL_TABLET | Freq: Every evening | ORAL | Status: DC | PRN
  Administered 2022-11-06: 3 mg via ORAL
  Filled 2022-11-06: qty 1

## 2022-11-06 MED ORDER — LORATADINE 10 MG PO TABS
10.0000 mg | ORAL_TABLET | Freq: Every day | ORAL | Status: DC
  Administered 2022-11-06 – 2022-11-07 (×2): 10 mg via ORAL
  Filled 2022-11-06 (×2): qty 1

## 2022-11-06 NOTE — Progress Notes (Signed)
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  Patient was admitted to ICU, seen by neurology. Subsequently extubated 8/23, 8/24 LTM discontinued and transferred to [facility] 8/25    Subjective: Patient seen and examined this morning.  NG tube in place, mother at the bedside, some NG tube discomfort.  Alert awake follows commands moving all his extremities, agreeable to go through rehab   Assessment and Plan: Principal Problem:   Status epilepticus (HCC) Active Problems:   Acute respiratory failure with hypoxia (HCC)   AKI (acute kidney injury) (HCC)  Status epilepticus History of anaplastic oligodendroglioma status post excision and chemotherapy: Initially needed intubation and ICU admission subsequently extubated 8/23 seen by neurology. lamotrigine level less than 1 on arrival suspecting noncompliance. LTM EEG completed 8/24>suggestive of epileptogenicity and cortical dysfunction in the right periatrial-occipital region likely secondary to underlying structural abnormality and with mild diffuse encephalopathy. Neurology following and cont lamotrigine , vimpat, seizure precautions Rescue medication intranasal Valtoco 15 mg for clinical seizure lasting more than 2 minutes-will need new prescription, he will follow-up with Dr. Barbaraann Cao continue seizure precaution.  Aspiration pneumonia: Completed antibiotic course, he is afebrile on room air.  WBC count normal Hypokalemia replaced Dysphagia seen by SLP> this morning changed to regular diet,augment diet..  Once taking p.o. discontinue NG core track  tube Deconditioning/debility with multiple comorbidities SEIZURE: PT OT recommending inpatient rehab and he is agreeable for CIR. DVT prophylaxis: heparin injection 5,000 Units Start: 11/01/22 0600 Code Status:   Code Status: Full Code Family Communication: plan of care discussed with patient/mother at bedside. Patient status is: Inpatient because of deconditioning debility awaiting for CIR Level of care: Telemetry Medical   Dispo: The patient is from: HOME W/ MOTHER            Anticipated disposition: CIR  Objective: Vitals last 24 hrs: Vitals:   11/05/22 1523 11/05/22 2133 11/06/22 0051 11/06/22 0908  BP: (!) 146/89 110/71 106/70 (!) 120/92  Pulse: 74 83 74 87  Resp: 16   19  Temp: 98.5 F (36.9 C) 99.2 F (37.3 C) 98.8 F (37.1 C) 99 F (37.2 C)  TempSrc: Oral Oral Oral Oral  SpO2: 98% 100% 97% 97%  Weight:      Height:       Weight change:   Physical Examination: General exam: alert awake, older than stated age HEENT:Oral mucosa moist, Ear/Nose WNL grossly Respiratory system: bilaterally CLEAR BS, no use of accessory muscle Cardiovascular system: S1 & S2 +, No JVD. Gastrointestinal system: Abdomen soft,NT,ND, BS+ Nervous System:Alert, awake, moving extremities. Extremities: LE edema neg,distal peripheral pulses palpable.  Skin: No rashes,no icterus. MSK: Normal muscle bulk,tone, power  Medications reviewed:  Scheduled Meds:  heparin  5,000 Units Subcutaneous Q8H   lamoTRIgine  250 mg Per Tube BID   loratadine  10 mg Oral Daily   Continuous Infusions:  sodium chloride 10 mL/hr at 11/05/22 1532   feeding supplement (OSMOLITE 1.5 CAL) 1,000 mL (11/06/22 8119)      Diet Order             Diet regular Room service appropriate? Yes; Fluid consistency: Thin  Diet effective now  Nutrition Problem: Inadequate oral intake Etiology: inability to eat Signs/Symptoms: NPO status Interventions: Tube feeding   Intake/Output Summary (Last 24  hours) at 11/06/2022 1018 Last data filed at 11/06/2022 0852 Gross per 24 hour  Intake 2250.08 ml  Output 1700 ml  Net 550.08 ml   Net IO Since Admission: 5,142.39 mL [11/06/22 1018]  Wt Readings from Last 3 Encounters:  11/05/22 61.9 kg  05/31/22 64 kg  04/25/22 63.3 kg     Unresulted Labs (From admission, onward)     Start     Ordered   11/05/22 1157  Lamotrigine level  Once,   R       Question:  Specimen collection method  Answer:  Lab=Lab collect   11/05/22 1156          Data Reviewed: I have personally reviewed following labs and imaging studies CBC: Recent Labs  Lab 10/31/22 2110 10/31/22 2158 11/01/22 0342 11/01/22 0606 11/02/22 0526 11/03/22 0548 11/05/22 0504  WBC 14.5*  --  9.9  --  6.9 5.5 6.5  NEUTROABS 8.3*  --   --   --   --   --   --   HGB 17.5*   < > 16.0 14.3 13.4 12.9* 15.0  HCT 53.4*   < > 45.0 42.0 38.3* 36.5* 41.5  MCV 102.1*  --  95.1  --  99.0 94.8 94.7  PLT 243  --  141*  --  117* 103* 143*   < > = values in this interval not displayed.   Basic Metabolic Panel: Recent Labs  Lab 11/01/22 0342 11/01/22 0606 11/01/22 1634 11/02/22 0526 11/02/22 1450 11/02/22 1821 11/03/22 0548 11/03/22 1757 11/05/22 0504  NA 135 135 138 134*  --   --  137  --  135  K 3.3* 3.3* 3.3* 3.7  --   --  3.3*  --  3.6  CL 104  --  104 106  --   --  111  --  102  CO2 19*  --  22 16*  --   --  18*  --  20*  GLUCOSE 93  --  79 76  --   --  130*  --  143*  BUN 16  --  10 8  --   --  7  --  <5*  CREATININE 1.00  --  1.22 1.12  --   --  0.78  --  0.57*  CALCIUM 7.8*  --  7.8* 7.5*  --   --  7.7*  --  9.2  MG 2.5*  --   --   --  1.9 2.0 1.8 2.4 2.0  PHOS 2.7  --   --   --  2.1* 2.1* 2.9 2.6 5.3*  AYT:[id number] Creatinine Clearance: 120.4 mL/min (A) (by C-G formula based on SCr of 0.57 mg/dL (L)). Liver Function Tests: Recent Labs  Lab 10/31/22 2110  AST 83*  ALT 52*  ALKPHOS 74  BILITOT 0.5  PROT 7.5  ALBUMIN 4.5   Recent Labs  Lab 11/04/22 2322  11/05/22 0308 11/05/22 0716 11/05/22 1146 11/05/22 1647  GLUCAP 134* 164* 152* 163* 121*   Recent Labs  Lab 10/31/22 2147 11/01/22 0119 11/02/22 0526  PROCALCITON  --   --  8.56  LATICACIDVEN 7.4* 1.4  --     Recent Results (from the past 240 hour(s))  Culture, blood (routine x 2)     Status: None   Collection Time: 11/01/22  1:13 AM   Specimen:  BLOOD RIGHT HAND  Result Value Ref Range Status   Specimen Description BLOOD RIGHT HAND  Final   Special Requests   Final    BOTTLES DRAWN AEROBIC AND ANAEROBIC Blood Culture adequate volume   Culture   Final    NO GROWTH 5 DAYS Performed at [facility], [address], [place], [place] [address]    Report Status 11/06/2022 FINAL  Final  Culture, blood (routine x 2)     Status: None   Collection Time: 11/01/22  1:13 AM   Specimen: BLOOD LEFT HAND  Result Value Ref Range Status   Specimen Description BLOOD LEFT HAND  Final   Special Requests   Final    BOTTLES DRAWN AEROBIC AND ANAEROBIC Blood Culture adequate volume   Culture   Final    NO GROWTH 5 DAYS Performed at [facility], [address] [place], [place] [address]    Report Status 11/06/2022 FINAL  Final  MRSA Next Gen by PCR, Nasal     Status: None   Collection Time: 11/02/22  9:45 AM   Specimen: Nasal Mucosa; Nasal Swab  Result Value Ref Range Status   MRSA by PCR Next Gen NOT DETECTED NOT DETECTED Final    Comment: (NOTE) The GeneXpert MRSA Assay (FDA approved for NASAL specimens only), is one component of a comprehensive MRSA colonization surveillance program. It is not intended to diagnose MRSA infection nor to guide or monitor treatment for MRSA infections. Test performance is not FDA approved in patients less than 5 years old. Performed at [facility], [address], [place], [place] [address]     Antimicrobials: Anti-infectives (From admission, onward)    Start     Dose/Rate Route Frequency Ordered Stop   11/01/22 0215   Ampicillin-Sulbactam (UNASYN) 3 g in sodium chloride 0.9 % 100 mL IVPB        3 g 200 mL/hr over 30 Minutes Intravenous Every 6 hours 11/01/22 0206 11/05/22 2147      Culture/Microbiology    Component Value Date/Time   SDES BLOOD RIGHT HAND 11/01/2022 0113   SDES BLOOD LEFT HAND 11/01/2022 0113   SPECREQUEST  11/01/2022 0113    BOTTLES DRAWN AEROBIC AND ANAEROBIC Blood Culture adequate volume   SPECREQUEST  11/01/2022 0113    BOTTLES DRAWN AEROBIC AND ANAEROBIC Blood Culture adequate volume   CULT  11/01/2022 0113    NO GROWTH 5 DAYS Performed at [facility], [address], [place], [place] [address]    CULT  11/01/2022 0113    NO GROWTH 5 DAYS Performed at [facility], [address] [place], [place] [address]    REPTSTATUS 11/06/2022 FINAL 11/01/2022 0113   REPTSTATUS 11/06/2022 FINAL 11/01/2022 0113  Radiology Studies: DG Abd Portable 1V  Result Date: 11/04/2022 CLINICAL DATA:  Encounter for feeding tube placement. EXAM: PORTABLE ABDOMEN - 1 VIEW COMPARISON:  Upper abdominal radiograph 11/01/2022 FINDINGS: Interval removal of the prior nasogastric tube and interval placement of weighted tip enteric tube. The tip overlies the distal gastric antrum/pylorus. Nonobstructive bowel-gas pattern. IMPRESSION: Weighted tip enteric tube tip overlies the distal gastric antrum/pylorus. Electronically Signed   By: Neita Garnet M.D.   On: 11/04/2022 13:36     LOS: 5 days   Lanae Boast, MD [facility]  11/06/2022, 10:18 AM

## 2022-11-06 NOTE — Hospital Course (Addendum)
  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  Patient was admitted to ICU, seen by neurology. Subsequently extubated 8/23, 8/24 LTM discontinued and transferred to [facility] 8/25

## 2022-11-06 NOTE — Progress Notes (Signed)
Inpatient Rehab Admissions:  Inpatient Rehab Consult received.  I met with patient, mom, and stepdad at the bedside for rehabilitation assessment and to discuss goals and expectations of an inpatient rehab admission.  Discussed average length of stay and discharge home after completion of CIR. Pt and pt's mom would prefer pt to receive outpatient therapy. TOC made aware.  Signed: Wolfgang Phoenix, MS, CCC-SLP Admissions Coordinator [phone]

## 2022-11-06 NOTE — Progress Notes (Signed)
Subjective: Awake and alert. Sitting in a chair by his bed. Mother and stepfather are present.   Objective: Current vital signs: BP 106/70 (BP Location: Right Arm)   Pulse 74   Temp 98.8 F (37.1 C) (Oral)   Resp 16   Ht 5\' 8"  (1.727 m)   Wt 61.9 kg   SpO2 97%   BMI 20.75 kg/m  Vital signs in last 24 hours: Temp:  [98.5 F (36.9 C)-99.3 F (37.4 C)] 98.8 F (37.1 C) (08/25 0051) Pulse Rate:  [70-101] 74 (08/25 0051) Resp:  [12-31] 16 (08/24 1523) BP: (106-146)/(70-96) 106/70 (08/25 0051) SpO2:  [97 %-100 %] 97 % (08/25 0051)  Intake/Output from previous day: 08/24 0701 - 08/25 0700 In: 1890.1 [I.V.:197.1; NG/GT:1423; IV Piggyback:270] Out: 1700 [Urine:1700] Intake/Output this shift: No intake/output data recorded. Nutritional status:  Diet Order             DIET - DYS 1 Room service appropriate? Yes with Assist; Fluid consistency: Nectar Thick  Diet effective now                   HEENT: Normocephalic. NGT in place.  Lungs: Respirations unlabored Ext: No edema   Neurologic Exam: Mental Status: Awake and alert. Oriented to the city, state, year, day of the week, month and situation. Speech fluent.    Cranial Nerves: II: Left homonymous hemianopsia.   III,IV, VI: No ptosis. EOMI with some difficulty initiating and maintaining smooth pursuits without turning his head from side to side. No nystagmus.  V: Temp sensation equal bilaterally VII: Smile symmetric VIII: Hearing intact to conversation IX,X: No hoarseness or hypophonia XI: Symmetric Motor: RUE 5/5 LUE 5/5  RLE 5/5 LLE 5/5 No drift today when testing [place] Sensory: Light touch intact throughout, bilaterally, no longer with extinction on the left.  Deep Tendon Reflexes: 2+ and symmetric throughout, except for 3+ left biceps/brachioradialis Cerebellar: No ataxia with FNF bilaterally Gait: Deferred  Lab Results: Results for orders placed or performed during the hospital encounter of 10/31/22 (from  the past 48 hour(s))  Glucose, capillary     Status: Abnormal   Collection Time: 11/04/22 11:40 AM  Result Value Ref Range   Glucose-Capillary 108 (H) 70 - 99 mg/dL    Comment: Glucose reference range applies only to samples taken after fasting for at least 8 hours.  Glucose, capillary     Status: Abnormal   Collection Time: 11/04/22  3:37 PM  Result Value Ref Range   Glucose-Capillary 120 (H) 70 - 99 mg/dL    Comment: Glucose reference range applies only to samples taken after fasting for at least 8 hours.  Glucose, capillary     Status: Abnormal   Collection Time: 11/04/22  8:03 PM  Result Value Ref Range   Glucose-Capillary 142 (H) 70 - 99 mg/dL    Comment: Glucose reference range applies only to samples taken after fasting for at least 8 hours.  Glucose, capillary     Status: Abnormal   Collection Time: 11/04/22 11:22 PM  Result Value Ref Range   Glucose-Capillary 134 (H) 70 - 99 mg/dL    Comment: Glucose reference range applies only to samples taken after fasting for at least 8 hours.  Glucose, capillary     Status: Abnormal   Collection Time: 11/05/22  3:08 AM  Result Value Ref Range   Glucose-Capillary 164 (H) 70 - 99 mg/dL    Comment: Glucose reference range applies only to samples taken after fasting for at  least 8 hours.  Basic metabolic panel     Status: Abnormal   Collection Time: 11/05/22  5:04 AM  Result Value Ref Range   Sodium 135 135 - 145 mmol/L   Potassium 3.6 3.5 - 5.1 mmol/L   Chloride 102 98 - 111 mmol/L   CO2 20 (L) 22 - 32 mmol/L   Glucose, Bld 143 (H) 70 - 99 mg/dL    Comment: Glucose reference range applies only to samples taken after fasting for at least 8 hours.   BUN <5 (L) 6 - 20 mg/dL   Creatinine, Ser 4.09 (L) 0.61 - 1.24 mg/dL   Calcium 9.2 8.9 - 81.1 mg/dL   GFR, Estimated >91 >47 mL/min    Comment: (NOTE) Calculated using the CKD-EPI Creatinine Equation (2021)    Anion gap 13 5 - 15    Comment: Performed at [facility], [address] [place], [place] [address]  CBC     Status: Abnormal   Collection Time: 11/05/22  5:04 AM  Result Value Ref Range   WBC 6.5 4.0 - 10.5 K/uL   RBC 4.38 4.22 - 5.81 MIL/uL   Hemoglobin 15.0 13.0 - 17.0 g/dL   HCT 21.3 08.6 - 57.8 %   MCV 94.7 80.0 - 100.0 fL   MCH 34.2 (H) 26.0 - 34.0 pg   MCHC 36.1 (H) 30.0 - 36.0 g/dL   RDW 46.9 62.9 - 52.8 %   Platelets 143 (L) 150 - 400 K/uL   nRBC 0.0 0.0 - 0.2 %    Comment: Performed at [facility], [address] [place], [place] [address]  Magnesium     Status: None   Collection Time: 11/05/22  5:04 AM  Result Value Ref Range   Magnesium 2.0 1.7 - 2.4 mg/dL    Comment: Performed at [facility], [address] [place], [place] [address]  Phosphorus     Status: Abnormal   Collection Time: 11/05/22  5:04 AM  Result Value Ref Range   Phosphorus 5.3 (H) 2.5 - 4.6 mg/dL    Comment: Performed at [facility], [address], [place], [place] [address]  Glucose, capillary     Status: Abnormal   Collection Time: 11/05/22  7:16 AM  Result Value Ref Range   Glucose-Capillary 152 (H) 70 - 99 mg/dL    Comment: Glucose reference range applies only to samples taken after fasting for at least 8 hours.  Glucose, capillary     Status: Abnormal   Collection Time: 11/05/22 11:46 AM  Result Value Ref Range   Glucose-Capillary 163 (H) 70 - 99 mg/dL    Comment: Glucose reference range applies only to samples taken after fasting for at least 8 hours.  Glucose, capillary     Status: Abnormal   Collection Time: 11/05/22  4:47 PM  Result Value Ref Range   Glucose-Capillary 121 (H) 70 - 99 mg/dL    Comment: Glucose reference range applies only to samples taken after fasting for at least 8 hours.    Recent Results (from the past 240 hour(s))  Culture, blood (routine x 2)     Status: None (Preliminary result)   Collection Time: 11/01/22  1:13 AM   Specimen: BLOOD RIGHT HAND  Result Value Ref Range Status   Specimen Description BLOOD  RIGHT HAND  Final   Special Requests   Final    BOTTLES DRAWN AEROBIC AND ANAEROBIC Blood Culture adequate volume   Culture  Final    NO GROWTH 4 DAYS Performed at [facility], [address], [place], [place] [address]    Report Status PENDING  Incomplete  Culture, blood (routine x 2)     Status: None (Preliminary result)   Collection Time: 11/01/22  1:13 AM   Specimen: BLOOD LEFT HAND  Result Value Ref Range Status   Specimen Description BLOOD LEFT HAND  Final   Special Requests   Final    BOTTLES DRAWN AEROBIC AND ANAEROBIC Blood Culture adequate volume   Culture   Final    NO GROWTH 4 DAYS Performed at [facility], [address], [place], [place] [address]    Report Status PENDING  Incomplete  MRSA Next Gen by PCR, Nasal     Status: None   Collection Time: 11/02/22  9:45 AM   Specimen: Nasal Mucosa; Nasal Swab  Result Value Ref Range Status   MRSA by PCR Next Gen NOT DETECTED NOT DETECTED Final    Comment: (NOTE) The GeneXpert MRSA Assay (FDA approved for NASAL specimens only), is one component of a comprehensive MRSA colonization surveillance program. It is not intended to diagnose MRSA infection nor to guide or monitor treatment for MRSA infections. Test performance is not FDA approved in patients less than 3 years old. Performed at [facility], [address], [place], [place] [address]     Lipid Panel No results for input(s): "CHOL", "TRIG", "HDL", "CHOLHDL", "VLDL", "LDLCALC" in the last 72 hours.  Studies/Results: DG Abd Portable 1V  Result Date: 11/04/2022 CLINICAL DATA:  Encounter for feeding tube placement. EXAM: PORTABLE ABDOMEN - 1 VIEW COMPARISON:  Upper abdominal radiograph 11/01/2022 FINDINGS: Interval removal of the prior nasogastric tube and interval placement of weighted tip enteric tube. The tip overlies the distal gastric antrum/pylorus. Nonobstructive bowel-gas pattern. IMPRESSION: Weighted tip enteric tube tip overlies the  distal gastric antrum/pylorus. Electronically Signed   By: Neita Garnet M.D.   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 - Exam today reveals left homonymous hemianopsia and LUE hyperreflexia. Fully alert and oriented with fluent speech.  - LTM report from yesterday (Saturday): Sharp waves, right parieto-occipital region; Intermittent slow, generalized and maximal right parieto-occipital region. This study was suggestive of epileptogenicity and cortical dysfunction in right parieto- occipital region likely secondary to underlying structural abnormality. Additionally there is mild diffuse encephalopathy. No seizures were seen during the study. - Medication compliance versus noncompliance: - Lamictal level was less than 1 on arrival. - Lamictal level drawn on 8/20 was 7.7. As this was drawn while in the hospital 24 hours after admission under supervised administration of his Lamictal, he is therefore unlikely to be a fast metabolizer of this AED.  - The patient stated on Saturday morning that he was overall compliant, only missing doses from time to time, but also may be having some false memory of recent events based mother's opinion (she was present during interview and describes  him as having delusional thoughts shortly after extubation). - Based upon the above, the most likely etiology of his breakthrough seizures is  medication noncompliance.   - Overall impression: Epilepsy with breakthrough seizure   Recommendations: -Continue lamotrigine 250 mg twice daily. -As patient was not compliant with Lamictal, will discontinue Vimpat, which was started this admission based on the possibility that he had been compliant with Lamictal and required an additional AED. The plan going forward is to encourage compliance with Lamictal.   -Rescue medication: Recommend intranasal Valtoco 15 mg for clinical seizure lasting more than 2 minutes.  Of note, patient will need a new prescription for this prior to discharge -Will need education about medication compliance and seizure provoking factors such as sleep deprivation, fatigue, EtOH use and drug use.  -Continue seizure precautions -As needed IV Ativan for clinical seizures while inpatient -Continue to follow-up with Dr. Barbaraann Cao. Next appointment is with Dr. Barbaraann Cao on 11/08/2022 at 12:30 PM in Oncology Henreitta Leber, MD).    -Discussed plan with family at bedside  -Neurohospitalist service will sign off, please call if there are additional questions.    LOS: 5 days   @Electronically  signed: Dr. Caryl Pina 11/06/2022  8:00 AM

## 2022-11-06 NOTE — Progress Notes (Signed)
Speech Language Pathology Treatment: Dysphagia  Patient Details Name: Stephen Beard MRN: [id number] DOB: 1995-07-24 Today's Date: 11/06/2022 Time: 0920-0932 SLP Time Calculation (min) (ACUTE ONLY): 12 min  Assessment / Plan / Recommendation Clinical Impression  ST follow up for therapeutic diet tolerance and possible diet advancement.  RN approved patient for PO intake.  The patient reported his swallow function felt improved and wanted to know when they would take out the cortrak.  Chart review indicated he has been afebrile, is on no supplemental O2 and lungs are clear.  Diagnostic treatment was completed.  He was presented with thin liquids via spoon, cup and straw, pureed material and dry solids.  His oral and pharyngeal swallow appeared to be functional.  Mastication of dry solids was judged to be adequate with good oral clearance.  Swallow trigger was appreciated to palpation and overt s/s of aspiration were not seen.  In addition, he passed a 3 oz water challenge making the likelihood for aspiration low.  Suggest advancing his diet to regular with thin liquids.  ST will follow briefly for ongoing therapeutic diet tolerance.    HPI HPI: 27 year old male with past medical history as below, which is significant for anaplastic oligodendroglioma s/p resection and chemotherapy.  He has had seizures since that time which have not been managed with lamotrigine.  In the evening hours of 8/20 he suffered a seizure at home which went on for 15 minutes before the arrival of EMS.  He was treated with benzodiazepines by EMS without improvement.  He continued to seize upon arrival to the emergency department he was treated with additional benzodiazepines and IV Keppra bolus.  Duration of seizure activity estimated at 30 minutes.  Seizures did then eventually break, but the patient was unresponsive and desaturating requiring intubation in the emergency department. Pt was intubated 8/19-8/23.  EEG suggestive  of epileptogenicity and cortical dysfunction in right parieto- occipital region likely secondary to underlying structural abnormality.      SLP Plan  Continue with current plan of care      Recommendations for follow up therapy are one component of a multi-disciplinary discharge planning process, led by the attending physician.  Recommendations may be updated based on patient status, additional functional criteria and insurance authorization.    Recommendations  Diet recommendations: Regular;Thin liquid Liquids provided via: Cup;Straw Medication Administration: Whole meds with liquid Supervision: Patient able to self feed Postural Changes and/or Swallow Maneuvers: Seated upright 90 degrees;Upright 30-60 min after meal                  Oral care BID   None Dysphagia, unspecified (R13.10)     Continue with current plan of care     Dimas Aguas, MA, CCC-SLP Acute Rehab SLP [phone]  Fleet Contras  11/06/2022, 9:35 AM

## 2022-11-07 ENCOUNTER — Inpatient Hospital Stay (HOSPITAL_COMMUNITY)
Admission: RE | Admit: 2022-11-07 | Discharge: 2022-11-12 | DRG: 945 | Disposition: A | Discharge status: Home / Self Care | Payer: Medicare Other | Source: Intra-hospital | Attending: Physical Medicine and Rehabilitation | Admitting: Physical Medicine and Rehabilitation

## 2022-11-07 ENCOUNTER — Other Ambulatory Visit: Payer: Self-pay

## 2022-11-07 ENCOUNTER — Encounter (HOSPITAL_COMMUNITY): Payer: Self-pay | Admitting: Pulmonary Disease

## 2022-11-07 ENCOUNTER — Encounter (HOSPITAL_COMMUNITY): Payer: Self-pay | Admitting: Physical Medicine and Rehabilitation

## 2022-11-07 DIAGNOSIS — J690 Pneumonitis due to inhalation of food and vomit: Secondary | ICD-10-CM | POA: Diagnosis present

## 2022-11-07 DIAGNOSIS — C714 Malignant neoplasm of occipital lobe: Secondary | ICD-10-CM

## 2022-11-07 DIAGNOSIS — F1010 Alcohol abuse, uncomplicated: Secondary | ICD-10-CM | POA: Diagnosis present

## 2022-11-07 DIAGNOSIS — F1910 Other psychoactive substance abuse, uncomplicated: Secondary | ICD-10-CM | POA: Diagnosis not present

## 2022-11-07 DIAGNOSIS — C711 Malignant neoplasm of frontal lobe: Secondary | ICD-10-CM | POA: Diagnosis present

## 2022-11-07 DIAGNOSIS — F259 Schizoaffective disorder, unspecified: Secondary | ICD-10-CM | POA: Diagnosis present

## 2022-11-07 DIAGNOSIS — G4700 Insomnia, unspecified: Secondary | ICD-10-CM | POA: Diagnosis present

## 2022-11-07 DIAGNOSIS — F32A Depression, unspecified: Secondary | ICD-10-CM | POA: Diagnosis present

## 2022-11-07 DIAGNOSIS — Z79899 Other long term (current) drug therapy: Secondary | ICD-10-CM

## 2022-11-07 DIAGNOSIS — F411 Generalized anxiety disorder: Secondary | ICD-10-CM | POA: Diagnosis present

## 2022-11-07 DIAGNOSIS — R5381 Other malaise: Secondary | ICD-10-CM | POA: Diagnosis present

## 2022-11-07 DIAGNOSIS — Z9221 Personal history of antineoplastic chemotherapy: Secondary | ICD-10-CM | POA: Diagnosis not present

## 2022-11-07 DIAGNOSIS — G40901 Epilepsy, unspecified, not intractable, with status epilepticus: Secondary | ICD-10-CM | POA: Diagnosis present

## 2022-11-07 DIAGNOSIS — M546 Pain in thoracic spine: Secondary | ICD-10-CM | POA: Diagnosis present

## 2022-11-07 DIAGNOSIS — F251 Schizoaffective disorder, depressive type: Secondary | ICD-10-CM | POA: Diagnosis present

## 2022-11-07 DIAGNOSIS — D696 Thrombocytopenia, unspecified: Secondary | ICD-10-CM | POA: Diagnosis present

## 2022-11-07 DIAGNOSIS — F17210 Nicotine dependence, cigarettes, uncomplicated: Secondary | ICD-10-CM | POA: Diagnosis present

## 2022-11-07 DIAGNOSIS — I6200 Nontraumatic subdural hemorrhage, unspecified: Secondary | ICD-10-CM

## 2022-11-07 DIAGNOSIS — Z91148 Patient's other noncompliance with medication regimen for other reason: Secondary | ICD-10-CM

## 2022-11-07 DIAGNOSIS — E876 Hypokalemia: Secondary | ICD-10-CM | POA: Diagnosis present

## 2022-11-07 LAB — GLUCOSE, CAPILLARY
Glucose-Capillary: 98 mg/dL (ref 70–99)
Glucose-Capillary: 125 mg/dL — ABNORMAL HIGH (ref 70–99)

## 2022-11-07 LAB — LAMOTRIGINE LEVEL: Lamotrigine Lvl: 11.9 ug/mL (ref 2.0–20.0)

## 2022-11-07 MED ORDER — BISACODYL 10 MG RE SUPP: 10.0000 mg | Freq: Every day | RECTAL | Status: DC | PRN

## 2022-11-07 MED ORDER — MELATONIN 3 MG PO TABS
3.0000 mg | ORAL_TABLET | Freq: Every evening | ORAL | Status: DC | PRN
  Administered 2022-11-09 – 2022-11-11 (×3): 3 mg via ORAL
  Filled 2022-11-07 (×3): qty 1

## 2022-11-07 MED ORDER — ALUM & MAG HYDROXIDE-SIMETH 200-200-20 MG/5ML PO SUSP: 30.0000 mL | ORAL | Status: DC | PRN

## 2022-11-07 MED ORDER — ACETAMINOPHEN 325 MG PO TABS: 325.0000 mg | ORAL_TABLET | ORAL | Status: DC | PRN

## 2022-11-07 MED ORDER — BENZOCAINE 10 % MT GEL: Freq: Four times a day (QID) | OROMUCOSAL | Status: DC | PRN

## 2022-11-07 MED ORDER — PROCHLORPERAZINE 25 MG RE SUPP: 12.5000 mg | Freq: Four times a day (QID) | RECTAL | Status: DC | PRN

## 2022-11-07 MED ORDER — GUAIFENESIN-DM 100-10 MG/5ML PO SYRP: 5.0000 mL | ORAL_SOLUTION | Freq: Four times a day (QID) | ORAL | Status: DC | PRN

## 2022-11-07 MED ORDER — PROCHLORPERAZINE MALEATE 5 MG PO TABS: 5.0000 mg | ORAL_TABLET | Freq: Four times a day (QID) | ORAL | Status: DC | PRN

## 2022-11-07 MED ORDER — MUSCLE RUB 10-15 % EX CREA: TOPICAL_CREAM | CUTANEOUS | Status: DC | PRN

## 2022-11-07 MED ORDER — LORATADINE 10 MG PO TABS
10.0000 mg | ORAL_TABLET | Freq: Every day | ORAL | Status: DC
  Administered 2022-11-08 – 2022-11-12 (×5): 10 mg via ORAL
  Filled 2022-11-07 (×5): qty 1

## 2022-11-07 MED ORDER — ACETAMINOPHEN 325 MG PO TABS: 325.0000 mg | ORAL_TABLET | ORAL | Status: AC | PRN

## 2022-11-07 MED ORDER — FLEET ENEMA RE ENEM: 1.0000 | ENEMA | Freq: Once | RECTAL | Status: DC | PRN

## 2022-11-07 MED ORDER — LORAZEPAM 2 MG/ML IJ SOLN: 2.0000 mg | INTRAMUSCULAR | Status: DC | PRN

## 2022-11-07 MED ORDER — FOOD THICKENER (SIMPLYTHICK): 5.0000 | ORAL | Status: DC | PRN

## 2022-11-07 MED ORDER — TROLAMINE SALICYLATE 10 % EX CREA: TOPICAL_CREAM | Freq: Two times a day (BID) | CUTANEOUS | Status: DC | PRN

## 2022-11-07 MED ORDER — POLYETHYLENE GLYCOL 3350 17 G PO PACK: 17.0000 g | PACK | Freq: Every day | ORAL | Status: DC | PRN

## 2022-11-07 MED ORDER — DIPHENHYDRAMINE HCL 25 MG PO CAPS: 25.0000 mg | ORAL_CAPSULE | Freq: Four times a day (QID) | ORAL | Status: DC | PRN

## 2022-11-07 MED ORDER — MELATONIN 5 MG PO TABS: 5.0000 mg | ORAL_TABLET | Freq: Every evening | ORAL | Status: DC | PRN

## 2022-11-07 MED ORDER — LIDOCAINE 5 % EX PTCH
1.0000 | MEDICATED_PATCH | CUTANEOUS | Status: DC
  Administered 2022-11-08 – 2022-11-09 (×2): 1 via TRANSDERMAL
  Filled 2022-11-07 (×4): qty 1

## 2022-11-07 MED ORDER — VALTOCO 15 MG DOSE 7.5 MG/0.1ML NA LQPK: 1.0000 | NASAL | Status: DC | PRN

## 2022-11-07 MED ORDER — PROCHLORPERAZINE EDISYLATE 10 MG/2ML IJ SOLN: 5.0000 mg | Freq: Four times a day (QID) | INTRAMUSCULAR | Status: DC | PRN

## 2022-11-07 MED ORDER — ENOXAPARIN SODIUM 40 MG/0.4ML IJ SOSY
40.0000 mg | PREFILLED_SYRINGE | INTRAMUSCULAR | Status: DC
  Administered 2022-11-07 – 2022-11-11 (×5): 40 mg via SUBCUTANEOUS
  Filled 2022-11-07 (×5): qty 0.4

## 2022-11-07 MED ORDER — LAMOTRIGINE 100 MG PO TABS
250.0000 mg | ORAL_TABLET | Freq: Two times a day (BID) | ORAL | Status: DC
  Administered 2022-11-07 – 2022-11-12 (×10): 250 mg via ORAL
  Filled 2022-11-07 (×10): qty 3

## 2022-11-07 NOTE — H&P (Addendum)
Physical Medicine and Rehabilitation Admission H&P    Chief Complaint  Patient presents with   Deficits due to break thorough seizures.     Stephen Beard is a 27 year old male with history of  Anxiety, depression, psychosis, bifrontal oligodendroglioma s/p RXSN, XRT and seizure disorder with reports of breakthrough seizures every 2 months. He was admitted on 11/01/22 with seizure lasting >30 minutes and intubated for airway protection due to status epilepticus. He required propofol,  was loaded with Keppra and home Lamictal continued. Unasyn added due to leucocytosis and concerns of aspiration PNA and he required Levophed due to hypotension. LT-EEG showed cortical dysfunction in right parieto-occipital region likely due to structural  abnormality and no definite seizures. He tolerated extubation without difficulty tand noted to have mild dysmetria on left with apraxic gait per Dr. Otelia Limes.  Vimpat added but d/c due to reports of  non compliance with Lamictal, educate on sleep deprivation, ETOH/drug use as well as recommendations to discharge with Voltoco 15 mg prn for seizure lasting > 30 minutes.  Patient reports he continues to feel like his legs are weak and unsteady.  Therapy has been working with patient who is limited by visual deficits, decreased fine motor movement, STM deficits and lacks insight/awareness of deficits. CIR recommended due to functional decline.    Review of Systems  Constitutional:  Negative for chills and fever.  HENT:  Negative for hearing loss.        Sore mouth due to trauma from intubation  Eyes:  Negative for double vision.       Left visual field deficits due to tumor. Blurry vision at times  Respiratory:  Negative for cough and shortness of breath.   Cardiovascular:  Negative for chest pain and leg swelling.  Gastrointestinal:  Negative for constipation and heartburn.  Genitourinary:  Negative for dysuria.  Musculoskeletal:  Positive for myalgias.  Negative for back pain and joint pain.  Neurological:  Positive for sensory change (numbness LUE) and weakness.  Psychiatric/Behavioral:  The patient has insomnia (uses melatonin but ineffective.).     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HCC)    GAD (generalized anxiety disorder)    Headache    Intentional overdose (HCC) 12/2019   Psychotic disorder (HCC)    Schizoaffective disorder (HCC)    Seizures (HCC)    Substance abuse (HCC)    Suicidal ideation     Past Surgical History:  Procedure Laterality Date   APPLICATION OF CRANIAL NAVIGATION N/A 07/02/2019   Procedure: APPLICATION OF CRANIAL NAVIGATION;  Surgeon: Jadene Pierini, MD;  Location: [facility] OR;  Service: Neurosurgery;  Laterality: N/A;   CRANIOTOMY N/A 07/03/2019   Procedure: CRANIOTOMY HEMATOMA EVACUATION SUBDURAL;  Surgeon: Jadene Pierini, MD;  Location: [facility] OR;  Service: Neurosurgery;  Laterality: N/A;   CRANIOTOMY Right 07/03/2019   Procedure: CRANIOTOMY HEMATOMA EVACUATION SUBDURAL;  Surgeon: Jadene Pierini, MD;  Location: [facility] OR;  Service: Neurosurgery;  Laterality: Right;   CRANIOTOMY Right 07/02/2019   Procedure: CRANIOTOMY TUMOR EXCISION with Normajean Glasgow;  Surgeon: Jadene Pierini, MD;  Location: [facility] OR;  Service: Neurosurgery;  Laterality: Right;  posterior    No family history on file.   Social History:  Lives with family. He reports that he has been smoking- 1/2 to 1 PPD.  He has a 12 pack-year smoking history. His smokeless tobacco use includes chew. He reports  current alcohol use--drinks 4 large cans of beer daily.  He reports current drug use. Drug: Cocaine.   Allergies:  Allergies  Allergen Reactions   Prozac [Fluoxetine]     Intolerable nausea/2015 office notes     Medications Prior to Admission  Medication Sig Dispense Refill   diazePAM, 15 MG Dose, (VALTOCO 15 MG DOSE) 2 x 7.5 MG/0.1ML LQPK Place 1 puff into the  nose as needed (for seizure lasting longer than 2 minutes).     lamoTRIgine (LAMICTAL) 100 MG tablet TAKE 1 TABLET BY MOUTH TWICE DAILY.TAKE ALONG WITH ONE 150 MG TABLET (250 MG TWICE DAILY TOTAL) (Patient taking differently: Take 100 mg by mouth 2 (two) times daily.) 60 tablet 0   lamoTRIgine (LAMICTAL) 150 MG tablet TAKE 1 TABLET BY MOUTH TWICE DAILY.TAKE WITH 100 MG TABLET.(250 MG TWICE DAILY TOTAL). (Patient taking differently: Take 150 mg by mouth 2 (two) times daily.) 60 tablet 0     Home: Home Living Family/patient expects to be discharged to:: Private residence Living Arrangements: Parent, Other relatives Available Help at Discharge: Family Type of Home: Apartment Home Access: Level entry Home Layout: One level Bathroom Shower/Tub: Engineer, manufacturing systems: Standard Bathroom Accessibility: No Home Equipment: Agricultural consultant (2 wheels) Additional Comments: lives with mom, step-dad and uncle with Down Syndrome   Functional History: Prior Function Prior Level of Function : Needs assist Mobility Comments: Denies recent falls ADLs Comments: Mom assists with IADLs   Functional Status:  Mobility: Bed Mobility Overal bed mobility: Modified Independent Bed Mobility: Supine to Sit Supine to sit: Min assist General bed mobility comments: HOB 20 degrees no rail used Transfers Overall transfer level: Needs assistance Equipment used: Rolling walker (2 wheels), 1 person hand held assist Transfers: Sit to/from Stand Sit to Stand: Contact guard assist General transfer comment: RW used initally, pt does better with HHA to eliminate the need to problem solve RW management. posterior bias initially upon standing Ambulation/Gait Ambulation/Gait assistance: Mod assist, +2 safety/equipment Gait Distance (Feet): 60 Feet Assistive device: Rolling walker (2 wheels), 1 person hand held assist Gait Pattern/deviations: Step-through pattern, Shuffle General Gait Details: pt with small,  shuffling steps at first, gait lengthened slightly with distance. Pt running into door on L and could not problem solve how to get around it, finally needed manual facilitation. Also became "stuck" when reentering room and could not figure out how to ambulate around the bed to the chair Gait velocity: decreased Gait velocity interpretation: <1.31 ft/sec, indicative of household ambulator   ADL: ADL Overall ADL's : Needs assistance/impaired Eating/Feeding: Moderate assistance Eating/Feeding Details (indicate cue type and reason): cortrak, nectar liquids Grooming: Contact guard assist, Standing, Oral care, Wash/dry face Upper Body Bathing: Minimal assistance, Sitting Lower Body Bathing: Moderate assistance, Sit to/from stand Upper Body Dressing : Minimal assistance, Sitting Lower Body Dressing: Moderate assistance, Sit to/from stand Toilet Transfer: Contact guard assist Toileting- Clothing Manipulation and Hygiene: Minimal assistance, Sit to/from stand Functional mobility during ADLs: Contact guard assist General ADL Comments: assist for balance and cognition   Cognition: Cognition Overall Cognitive Status: Impaired/Different from baseline Orientation Level: Oriented X4 [place] Scales of Cognitive Functioning: Confused, Appropriate: Moderate Assistance Cognition Arousal: Alert Behavior During Therapy: WFL for tasks assessed/performed Overall Cognitive Status: Impaired/Different from baseline Area of Impairment: Memory, Following commands, Safety/judgement, Awareness, Problem solving Orientation Level: Disoriented to, Situation Current Attention Level: Selective Memory: Decreased short-term memory Following Commands: Follows multi-step commands with increased time Safety/Judgement: Decreased awareness of safety, Decreased awareness of deficits  Awareness: Intellectual Problem Solving: Slow processing, Difficulty sequencing General Comments: pt needed increased time and mod  cues to locate his room. pt noted to have a R to L scanning pattern when looking for cues. pt verbalizing "L is hot R is not" as a memory cue to use the sink. pt verbalized needing these cues to help him sequence task to ensure he gets them correct at this time. Pt was able to name >11 animals in 1 minute but when paired with walking greatly decreased his gait velocity. pt shows deficits with dual task.     There were no vitals taken for this visit.    General: No apparent distress HEENT: Head is normocephalic, atraumatic, PERRLA, EOMI, sclera anicteric, oral mucosa pink and moist, dentition intact, + glasses Neck: Supple without JVD or lymphadenopathy Heart: Reg rate and rhythm. No murmurs rubs or gallops Chest: CTA bilaterally without wheezes, rales, or rhonchi; no distress Abdomen: Soft, non-tender, non-distended, bowel sounds positive. Extremities: No clubbing, cyanosis, or edema. Pulses are 2+ Psych: Pt's affect is appropriate. Pt is cooperative Skin: Clean and intact without signs of breakdown Neuro:  Alert and oriented x4, Speech clear. Able to follow simple commands without difficulty. Decrease in fine motor movements in LUE. Nystagmus in all fields--reports vision getting worse, CN 2-12 intact Able to recall 3/3 words 5 min later RUE: 5/5 Deltoid, 5/5 Biceps, 5/5 Triceps, 5/5 Wrist Ext, 5/5 Grip LUE: 5/5 Deltoid, 5/5 Biceps, 5/5 Triceps, 5/5 Wrist Ext, 5/5 Grip RLE: HF 5/5, KE 5/5, KF 5/5, ADF 5/5, APF 5/5 LLE: HF 5/5, KE 5/5, HF, 5/5, ADF 5/5, APF 5/5 Sensory exam -reports altered sensation LUE. No limb ataxia or cerebellar signs. No abnormal tone appreciated.  DTR 2+ and symmetric Musculoskeletal: No joint swelling or tenderness noted Noted to be coming out of bathroom  with wide based gait.  (reports was cleared to ambulate with family present)  Small bruises b/l arms IV L arm  Results for orders placed or performed during the hospital encounter of 10/31/22 (from the past 48  hour(s))  Glucose, capillary     Status: Abnormal   Collection Time: 11/05/22  4:47 PM  Result Value Ref Range   Glucose-Capillary 121 (H) 70 - 99 mg/dL    Comment: Glucose reference range applies only to samples taken after fasting for at least 8 hours.  Glucose, capillary     Status: Abnormal   Collection Time: 11/06/22 12:34 PM  Result Value Ref Range   Glucose-Capillary 143 (H) 70 - 99 mg/dL    Comment: Glucose reference range applies only to samples taken after fasting for at least 8 hours.  Glucose, capillary     Status: Abnormal   Collection Time: 11/06/22  4:05 PM  Result Value Ref Range   Glucose-Capillary 114 (H) 70 - 99 mg/dL    Comment: Glucose reference range applies only to samples taken after fasting for at least 8 hours.  Glucose, capillary     Status: Abnormal   Collection Time: 11/06/22  9:58 PM  Result Value Ref Range   Glucose-Capillary 110 (H) 70 - 99 mg/dL    Comment: Glucose reference range applies only to samples taken after fasting for at least 8 hours.   Comment 1 Notify RN    Comment 2 Document in Chart   Glucose, capillary     Status: None   Collection Time: 11/07/22  6:29 AM  Result Value Ref Range   Glucose-Capillary 98 70 - 99 mg/dL  Comment: Glucose reference range applies only to samples taken after fasting for at least 8 hours.  Glucose, capillary     Status: Abnormal   Collection Time: 11/07/22 11:52 AM  Result Value Ref Range   Glucose-Capillary 125 (H) 70 - 99 mg/dL    Comment: Glucose reference range applies only to samples taken after fasting for at least 8 hours.   Comment 1 Notify RN    Comment 2 Document in Chart    No results found.    There were no vitals taken for this visit.  Medical Problem List and Plan: 1. 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  Antithrombotics: -DVT/anticoagulation:  Pharmaceutical: Lovenox  -antiplatelet therapy: N/A 3. Pain Management:  Lidocaine patch added for mid back pain/discomfort.  4. Mood/Behavior/Sleep: Melatonin 5 mg prn (used a couple pills prn). Trazodone worked but does not like hangover effect-->"does not like strong medication"  -antipsychotic agents: N/A--quit meds. Has been to "mental hospital" X 2 5. Neuropsych/cognition: This patient may be intermittently capable of making decisions on his own behalf. 6. Skin/Wound Care: Routine pressure relief measures.  7. Fluids/Electrolytes/Nutrition: Monitor I/O. Check CMET in am 8. Status epilepticus: Due to medication noncompliance, insomnia and 4 beers nightly.  --typical seizure--> Room gets bigger, smells something burning and LUE starts having burning sensation.   --Has been having breakthrough seizures every couple of months per patient and mother. To continue Lamictal 250 mg BID and reinforce compliance.  Discussed compliance with medication --Valtoco 15 mg prn for seizure lasting > 2 minutes for use after discharge.  9.  Anaplastic oligodendroglioma s/p RSXN: Followed by Dr. Gypsy Decant appt tomorrow  --surgery by Dr. Maurice Small  10. H/o depression/GAD/psychosis: Reports male voice in head is very distant now. Liked Wellbutrin--nothing works.   --buspar prn resumed.  11. H/o Polysubstance abuse: Admits to ETOH. Meth/cocaine per chart review.  --he reports that he was started on Naltrexone by PCP for ETOH abuse but not taken for up to 4 weeks per mother.  12.  Aspiration pneumonia:  -Completed course of antibiotics 13.  Hypokalemia: Recheck labs tomorrow   Jacquelynn Cree, PA-C 11/07/2022  I have personally performed a face to face diagnostic evaluation of this patient and formulated the key components of the plan.  Additionally, I  have personally reviewed laboratory data, imaging studies, as well as relevant notes and concur with the physician assistant's documentation above.  The patient's status has not changed from the original H&P.  Any changes in documentation from the acute care chart have been noted above.  Fanny Dance, MD, [place] [facility]

## 2022-11-07 NOTE — Progress Notes (Signed)
Inpatient Rehab Admissions Coordinator:  There is a bed available for pt in CIR today. Dr. Dayna Barker aware and in agreement. Pt, pt's mother, NSG, and TOC made aware.   Wolfgang Phoenix, MS, CCC-SLP Admissions Coordinator [phone]

## 2022-11-07 NOTE — PMR Pre-admission (Signed)
PMR Admission Coordinator Pre-Admission Assessment  Patient: Stephen Beard is an 27 y.o., male MRN: [id number] DOB: 09/10/1995 Height: 5\' 8"  (172.7 cm) Weight: 61.9 kg  Insurance Information HMO:     PPO:      PCP:      IPA:      80/20: yes     OTHER:  PRIMARY: Medicare A & B      Policy#: [id number]      Subscriber: patient CM Name:       Phone#:      Fax#:  Pre-Cert#:       Employer:  Benefits:  Phone #: verified eligibility via [employer] on 11/07/22     Name:  Eff. Date: Part A & B effective 12/12/21     Deduct: $16,32      Out of Pocket Max: NA      Life Max: NA CIR: 100% coverage      SNF: 100% coverage for days 1-20, 80% coverage for days 21-100 Outpatient: 80% coverage     Co-Pay: 20% Home Health: 100% coverage      Co-Pay:  DME: 80% coverage     Co-Pay: 20% Providers: pt's choice SECONDARY:       Policy#:      Phone#:   Artist:       Phone#:   The Engineer, materials Information Summary" for patients in Inpatient Rehabilitation Facilities with attached "Privacy Act Statement-Health Care Records" was provided and verbally reviewed with: Patient  Emergency Contact Information Contact Information     Name Relation Home Work Mobile   [name] Mother [phone]  [phone]   [name], [name]   [phone]      Other Contacts     Name Relation Home Work Mobile   [name],[name] Stepfather   [phone]       Current Medical History  Patient Admitting Diagnosis: debility s/p seizures and SDH History of Present Illness: Pt is a 26 year old male with medical hx significant for: 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 Pt presented to [facility] on 10/31/22 d/t prolonged generalized tonic-clonic seizure. Pt was seizing ~30-40 minutes. Pt given IV Versed. Pt had agonal respirations and sats in low 80s on 15L NRB in ED. Pt emergently  intubated. CT head new hyperdensity concerning for recurrent tumor versus hemorrhage. LTM EEG from 8/19-8/20 was suggestive of cortical dysfunction in right parietal occipital region likely secondary to underlying structural abnormality. No seizures seen. MRI showed trach SDH and uncharged acute hemorrhage in area of resection, no evidence of tumor resection. LTM EEG from 8/20-8/21 was suggestive of cortical dysfunction in right parieto-occipital region likely secondary to underlying structural abnormality; moderate to severe diffuse encephalopathy. No seizures seen. LTM EEG from 8/21-8/22 was suggestive of mild diffuse encephalopathy. No seizures seen. LTM EEG from 8/22-8/23 was suggestive of epileptogenicity and cortical dysfunction in right parieto-occipital region likely secondary to underlying structural abnormality. There was mild diffuse encephalopathy. No seizures seen. Pt extubated on 11/04/22. LTM EEG from 8/23-8/24 was suggestive of epileptogenicity and cortical dysfunction in right parieto-occipital region likely secondary to underlying structural abnormality. There was mild diffuse encephalopathy. No seizures seen. LTM EEG discontinued on 8/24. Therapy evaluations completed and CIR recommended d/t pt's deficits in functional mobility.     Patient's medical record from [facility] has been reviewed by the rehabilitation admission coordinator and physician.  Past Medical History  Past Medical History:  Diagnosis Date   ADD (  attention deficit disorder)    ADHD    Bifrontal oligodendroglioma (HCC)    GAD (generalized anxiety disorder)    Headache    Intentional overdose (HCC) 12/2019   Psychotic disorder (HCC)    Schizoaffective disorder (HCC)    Seizures (HCC)    Substance abuse (HCC)    Suicidal ideation     Has the patient had major surgery during 100 days prior to admission? No  Family History   family history is not on file.  Current Medications  Current  Facility-Administered Medications:    0.9 %  sodium chloride infusion, 250 mL, Intravenous, Continuous, Sood, Vineet, MD, Last Rate: 10 mL/hr at 11/05/22 1532, Infusion Verify at 11/05/22 1532   docusate (COLACE) 50 MG/5ML liquid 100 mg, 100 mg, Per Tube, BID PRN, Coralyn Helling, MD, 100 mg at 11/02/22 1610   food thickener (SIMPLYTHICK (NECTAR/LEVEL 2/MILDLY THICK)) 5 packet, 5 packet, Oral, PRN, Coralyn Helling, MD   heparin injection 5,000 Units, 5,000 Units, Subcutaneous, Q8H, Coralyn Helling, MD, 5,000 Units at 11/07/22 0537   lamoTRIgine (LAMICTAL) tablet 250 mg, 250 mg, Per Tube, BID, Coralyn Helling, MD, 250 mg at 11/07/22 1026   loratadine (CLARITIN) tablet 10 mg, 10 mg, Oral, Daily, Kc, Ramesh, MD, 10 mg at 11/07/22 1026   LORazepam (ATIVAN) injection 2 mg, 2 mg, Intravenous, PRN, Coralyn Helling, MD   melatonin tablet 3 mg, 3 mg, Oral, QHS PRN, Opyd, Lavone Neri, MD, 3 mg at 11/06/22 2112   Oral care mouth rinse, 15 mL, Mouth Rinse, PRN, Coralyn Helling, MD   polyethylene glycol (MIRALAX / GLYCOLAX) packet 17 g, 17 g, Per Tube, Daily PRN, Coralyn Helling, MD  Patients Current Diet:  Diet Order             Diet regular Room service appropriate? Yes; Fluid consistency: Thin  Diet effective now                   Precautions / Restrictions Precautions Precautions: Fall Precaution Comments: seizure Restrictions Weight Bearing Restrictions: No   Has the patient had 2 or more falls or a fall with injury in the past year? Yes  Prior Activity Level Community (5-7x/wk): gets out of house ~3 days/week  Prior Functional Level Self Care: Did the patient need help bathing, dressing, using the toilet or eating? Independent  Indoor Mobility: Did the patient need assistance with walking from room to room (with or without device)? Independent  Stairs: Did the patient need assistance with internal or external stairs (with or without device)? Independent  Functional Cognition: Did the patient need help  planning regular tasks such as shopping or remembering to take medications? Needed some help  Patient Information Are you of Hispanic, Latino/a,or Spanish origin?: A. No, not of Hispanic, Latino/a, or Spanish origin What is your race?: A. White Do you need or want an interpreter to communicate with a doctor or health care staff?: 0. No  Patient's Response To:  Health Literacy and Transportation Is the patient able to respond to health literacy and transportation needs?: Yes Health Literacy - How often do you need to have someone help you when you read instructions, pamphlets, or other written material from your doctor or pharmacy?: Never In the past 12 months, has lack of transportation kept you from medical appointments or from getting medications?: No In the past 12 months, has lack of transportation kept you from meetings, work, or from getting things needed for daily living?: No  Home Assistive Devices /  Equipment Home Assistive Devices/Equipment: Dan Humphreys (specify type) Home Equipment: Rolling Walker (2 wheels)  Prior Device Use: Indicate devices/aids used by the patient prior to current illness, exacerbation or injury? None of the above  Current Functional Level Cognition  Overall Cognitive Status: Impaired/Different from baseline Current Attention Level: Selective Orientation Level: Oriented X4 Following Commands: Follows multi-step commands with increased time Safety/Judgement: Decreased awareness of safety, Decreased awareness of deficits General Comments: pt needed increased time and mod cues to locate his room. pt noted to have a R to L scanning pattern when looking for cues. pt verbalizing "L is hot R is not" as a memory cue to use the sink. pt verbalized needing these cues to help him sequence task to ensure he gets them correct at this time. Pt was able to name >11 animals in 1 minute but when paired with walking greatly decreased his gait velocity. pt shows deficits with dual  task. [place] [employer] Scales of Cognitive Functioning: Confused, Appropriate: Moderate Assistance    Extremity Assessment (includes Sensation/Coordination)  Upper Extremity Assessment: Right hand dominant RUE Deficits / Details: decreased fine motor with visual component noted. mother states he could normally do that normal speed without any trouble. pt needed cues to add the cap back to the paste and increased time with head tilt to apply the cap LUE Deficits / Details: Overall WFL, sensation is [facility], however he is unable to feel both arms when touched at the same time  Lower Extremity Assessment: Defer to PT evaluation LLE Deficits / Details: noted to favor L LE and less clearing of the foot LLE Sensation: decreased light touch, decreased proprioception LLE Coordination: decreased gross motor    ADLs  Overall ADL's : Needs assistance/impaired Eating/Feeding: Moderate assistance Eating/Feeding Details (indicate cue type and reason): cortrak, nectar liquids Grooming: Contact guard assist, Standing, Oral care, Wash/dry face Upper Body Bathing: Minimal assistance, Sitting Lower Body Bathing: Moderate assistance, Sit to/from stand Upper Body Dressing : Minimal assistance, Sitting Lower Body Dressing: Moderate assistance, Sit to/from stand Toilet Transfer: Contact guard assist Toileting- Clothing Manipulation and Hygiene: Minimal assistance, Sit to/from stand Functional mobility during ADLs: Contact guard assist General ADL Comments: assist for balance and cognition    Mobility  Overal bed mobility: Modified Independent Bed Mobility: Supine to Sit Supine to sit: Min assist General bed mobility comments: HOB 20 degrees no rail used    Transfers  Overall transfer level: Needs assistance Equipment used: Rolling walker (2 wheels), 1 person hand held assist Transfers: Sit to/from Stand Sit to Stand: Contact guard assist General transfer comment: RW used initally, pt does better with HHA  to eliminate the need to problem solve RW management. posterior bias initially upon standing    Ambulation / Gait / Stairs / Wheelchair Mobility  Ambulation/Gait Ambulation/Gait assistance: Mod assist, +2 safety/equipment Gait Distance (Feet): 60 Feet Assistive device: Rolling walker (2 wheels), 1 person hand held assist Gait Pattern/deviations: Step-through pattern, Shuffle General Gait Details: pt with small, shuffling steps at first, gait lengthened slightly with distance. Pt running into door on L and could not problem solve how to get around it, finally needed manual facilitation. Also became "stuck" when reentering room and could not figure out how to ambulate around the bed to the chair Gait velocity: decreased Gait velocity interpretation: <1.31 ft/sec, indicative of household ambulator    Posture / Balance Balance Overall balance assessment: Needs assistance Sitting-balance support: No upper extremity supported, Feet supported Sitting balance-Leahy Scale: Fair Standing balance support: Single  extremity supported, During functional activity Standing balance-Leahy Scale: Poor Standing balance comment: unsteady in standing and at times startles which throws him off balance High level balance activites: Side stepping, Direction changes High Level Balance Comments: pt needs increased time and putting hands out to balance himself to turn.    Special needs/care consideration Continuous Drip IV  0.9% sodium chloride infusion   Previous Home Environment (from acute therapy documentation) Living Arrangements: Parent  Lives With: Family (mother and stepfather) Available Help at Discharge: Family, Available 24 hours/day Type of Home: Apartment Home Layout: One level Home Access: Level entry Bathroom Shower/Tub: Engineer, manufacturing systems: Standard Bathroom Accessibility: Yes How Accessible: Accessible via walker Home Care Services: No Additional Comments: lives with mom,  step-dad and uncle with Down Syndrome  Discharge Living Setting Plans for Discharge Living Setting: Patient's home Type of Home at Discharge: Apartment Discharge Home Layout: One level Discharge Home Access: Level entry Discharge Bathroom Shower/Tub: Tub/shower unit Discharge Bathroom Toilet: Standard Discharge Bathroom Accessibility: Yes How Accessible: Accessible via walker Does the patient have any problems obtaining your medications?: No  Social/Family/Support Systems Anticipated Caregiver: Thomes Lolling, mother Anticipated Industrial/product designer Information: [phone] Caregiver Availability: 24/7 Discharge Plan Discussed with Primary Caregiver: Yes Is Caregiver In Agreement with Plan?: Yes Does Caregiver/Family have Issues with Lodging/Transportation while Pt is in Rehab?: No  Goals Patient/Family Goal for Rehab: Supervision: PT/OT, Mod I: ST Expected length of stay: 7-10 days Pt/Family Agrees to Admission and willing to participate: Yes Program Orientation Provided & Reviewed with Pt/Caregiver Including Roles  & Responsibilities: Yes  Decrease burden of Care through IP rehab admission: NA  Possible need for SNF placement upon discharge: Not anticipated  Patient Condition: I have reviewed medical records from [facility], spoken with CM, and patient and family member. I met with patient at the bedside for inpatient rehabilitation assessment.  Patient will benefit from ongoing PT, OT, and SLP, can actively participate in 3 hours of therapy a day 5 days of the week, and can make measurable gains during the admission.  Patient will also benefit from the coordinated team approach during an Inpatient Acute Rehabilitation admission.  The patient will receive intensive therapy as well as Rehabilitation physician, nursing, social worker, and care management interventions.  Due to safety, disease management, medication administration, pain management, and patient education the  patient requires 24 hour a day rehabilitation nursing.  The patient is currently Min-Mod A with mobility and Min-Mod A with basic ADLs.  Discharge setting and therapy post discharge at home with home health is anticipated.  Patient has agreed to participate in the Acute Inpatient Rehabilitation Program and will admit today.  Preadmission Screen Completed By:  Domingo Pulse, 11/07/2022 11:45 AM ______________________________________________________________________   Discussed status with Dr. Natale Lay on 11/07/22  at 11:45 AM and received approval for admission today.  Admission Coordinator:  Domingo Pulse, CCC-SLP, time 11:45 AM/Date 11/07/22    Assessment/Plan: Diagnosis: Epilepsy with breakthrough seizures with hx of anaplastic oligodendroglioma status post excision and chemotherapy  Does the need for close, 24 hr/day Medical supervision in concert with the patient's rehab needs make it unreasonable for this patient to be served in a less intensive setting? Yes Co-Morbidities requiring supervision/potential complications: Aspiration PNA, Hypokalemia, Dysphagia, status epilepticus, AKI, Acute respiratory failure Due to bladder management, bowel management, safety, skin/wound care, disease management, medication administration, pain management, and patient education, does the patient require 24 hr/day rehab nursing? Yes Does the patient require coordinated care  of a physician, rehab nurse, PT, OT, and SLP to address physical and functional deficits in the context of the above medical diagnosis(es)? Yes Addressing deficits in the following areas: balance, endurance, locomotion, strength, transferring, bowel/bladder control, bathing, dressing, feeding, grooming, toileting, cognition, speech, language, swallowing, and psychosocial support Can the patient actively participate in an intensive therapy program of at least 3 hrs of therapy 5 days a week? Yes The potential for patient to  make measurable gains while on inpatient rehab is excellent Anticipated functional outcomes upon discharge from inpatient rehab: supervision PT, supervision OT, modified independent SLP Estimated rehab length of stay to reach the above functional goals is: 7-10 Anticipated discharge destination: Home 10. Overall Rehab/Functional Prognosis: good   MD Signature: Fanny Dance

## 2022-11-07 NOTE — Progress Notes (Signed)
INPATIENT REHABILITATION ADMISSION NOTE   Arrival Method:wheelchair     Mental Orientation: alert  Assessment:done   Skin:done   IV'S:left FA   Pain:none   Tubes and Drains:none   Safety Measures:done   Vital Signs:done   Height and Weight:done   Rehab Orientation:done   Family:mother    Notes:

## 2022-11-07 NOTE — Discharge Summary (Signed)
Physician Discharge Summary  Stephen Beard ZOX:[id number] DOB: [birth year] DOA: 10/31/2022  PCP: [facility]  Admit date: 10/31/2022 Discharge date: 11/07/2022 Recommendations for Outpatient Follow-up:  Follow up with PCP in 1 weeks-call for appointment Please obtain BMP/CBC in one week Follow-up with Dr. Vaslow/neurology  Discharge Dispo: CIR Discharge Condition: Stable Code Status:   Code Status: Full Code Diet recommendation:  Diet Order             Diet regular Room service appropriate? 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  Patient was admitted to ICU, seen by neurology. Subsequently extubated 8/23, 8/24 LTM discontinued and transferred to [facility] 8/25> passed swallow and ng tube discontinued.  PT OT advised CIR. Patient treated for epilepsy with breakthrough seizure, per neurology continue Lamictal 250 mg bid,, continue rescue medication at home, driving restriction and follow-up with Dr. Barbaraann Cao, neurology as outpatient.  Patient is medically stable and is being discharged to CIR today   Discharge Diagnoses:  Principal Problem:   Status epilepticus (HCC) Active Problems:   Acute respiratory failure with hypoxia (HCC)   AKI (acute kidney injury) (HCC)  Epilepsy w/ breakthrough seizure-Status epilepticus History of anaplastic oligodendroglioma status post excision and chemotherapy: Initially needed intubation and ICU admission subsequently extubated 8/23 seen by neurology. lamotrigine level less than 1 on arrival suspecting noncompliance. LTM EEG completed 8/24>suggestive of epileptogenicity and cortical dysfunction  in the right periatrial-occipital region likely secondary to underlying structural abnormality and with mild diffuse encephalopathy. per neurology continue Lamictal 250 mg bid,, continue rescue medication at home, driving restriction and follow-up with Dr. Barbaraann Cao, neurology as outpatient.  Rescue medication> intranasal Valtoco 15 mg for clinical seizure lasting more than 2 minutes-will need new prescription at the time of discharge from rehab facility,   Aspiration pneumonia: Completed antibiotic, and is doing well.  Stable on room air.  Hypokalemia replaced Dysphagia seen by SLP>changed to regular diet and NGT discontinued 8/26 Deconditioning/debility with multiple comorbidities SEIZURE: stable for CIR today   Consults: Pccm, neuro,  Subjective: Aaox3 feels well. Wants to go to CIR  Discharge Exam: Vitals:   11/07/22 0343 11/07/22 0941  BP: 112/84 102/72  Pulse: 81 62  Resp: 18 17  Temp: 98.6 F (37 C) 98 F (36.7 C)  SpO2: 98% 99%   General: Pt is alert, awake, not in acute distress Cardiovascular: RRR, S1/S2 +, no rubs, no gallops Respiratory: CTA bilaterally, no wheezing, no rhonchi Abdominal: Soft, NT, ND, bowel sounds + Extremities: no edema, no cyanosis  Discharge Instructions  Discharge Instructions     Discharge instructions   Complete by: As directed    Seizure precautions: Per [facility] statutes, patients with seizures are not allowed to drive until they have been seizure-free for six months and cleared by a physician    Use caution when using heavy equipment or power tools. Avoid working on ladders or at heights. Take showers instead of baths. Ensure the water temperature is not too high on the home water heater. Do not go swimming alone. Do not lock yourself in a room alone (i.e. bathroom). When caring for infants or small children, sit down when  holding, feeding, or changing them to minimize risk of injury to the child in the event you have a seizure.  Maintain good sleep hygiene. Avoid alcohol.    If patient has another seizure, call 911 and bring them back to the ED if: A.  The seizure lasts longer than 5 minutes.      B.  The patient doesn't wake shortly after the seizure or has new problems such as difficulty seeing, speaking or moving following the seizure C.  The patient was injured during the seizure D.  The patient has a temperature over 102 F (39C) E.  The patient vomited during the seizure and now is having trouble breathing    During the Seizure   - First, ensure adequate ventilation and place patients on the floor on their left side  Loosen clothing around the neck and ensure the airway is patent. If the patient is clenching the teeth, do not force the mouth open with any object as this can cause severe damage - Remove all items from the surrounding that can be hazardous. The patient may be oblivious to what's happening and may not even know what he or she is doing. If the patient is confused and wandering, either gently guide him/her away and block access to outside areas - Reassure the individual and be comforting - Call 911. In most cases, the seizure ends before EMS arrives. However, there are cases when seizures may last over 3 to 5 minutes. Or the individual may have developed breathing difficulties or severe injuries. If a pregnant patient or a person with diabetes develops a seizure, it is prudent to call an ambulance. - Finally, if the patient does not regain full consciousness, then call EMS. Most patients will remain confused for about 45 to 90 minutes after a seizure, so you must use judgment in calling for help.    After the Seizure (Postictal Stage)   After a seizure, most patients experience confusion, fatigue, muscle pain and/or a headache. Thus, one should permit the individual to sleep. For the next few days, reassurance is essential. Being calm and helping reorient the person is also of importance.   Most  seizures are painless and end spontaneously. Seizures are not harmful to others but can lead to complications such as stress on the lungs, brain and the heart. Individuals with prior lung problems may develop labored breathing and respiratory distress.    Increase activity slowly   Complete by: As directed       Allergies as of 11/07/2022   No Known Allergies      Medication List     STOP taking these medications    GOODYS BACK & BODY PAIN PO   ibuprofen 200 MG tablet Commonly known as: ADVIL   levETIRAcetam 500 MG tablet Commonly known as: Keppra   naltrexone 50 MG tablet Commonly known as: DEPADE       TAKE these medications    lamoTRIgine 150 MG tablet Commonly known as: LAMICTAL TAKE 1 TABLET BY MOUTH TWICE DAILY.TAKE WITH 100 MG TABLET.(250 MG TWICE DAILY TOTAL). What changed: See the new instructions.   lamoTRIgine 100 MG tablet Commonly known as: LAMICTAL TAKE 1 TABLET BY MOUTH TWICE DAILY.TAKE ALONG WITH ONE 150 MG TABLET (250 MG TWICE DAILY TOTAL) What changed: See the new instructions.        No Known Allergies  The results of significant diagnostics from this hospitalization (including imaging, microbiology, ancillary and laboratory) are listed below for reference.  Microbiology: Recent Results (from the past 240 hour(s))  Culture, blood (routine x 2)     Status: None   Collection Time: 11/01/22  1:13 AM   Specimen: BLOOD RIGHT HAND  Result Value Ref Range Status   Specimen Description BLOOD RIGHT HAND  Final   Special Requests   Final    BOTTLES DRAWN AEROBIC AND ANAEROBIC Blood Culture adequate volume   Culture   Final    NO GROWTH 5 DAYS Performed at [facility], [address], [place], [place] [address]    Report Status 11/06/2022 FINAL  Final  Culture, blood (routine x 2)     Status: None   Collection Time: 11/01/22  1:13 AM   Specimen: BLOOD LEFT HAND  Result Value Ref Range Status   Specimen Description BLOOD LEFT HAND   Final   Special Requests   Final    BOTTLES DRAWN AEROBIC AND ANAEROBIC Blood Culture adequate volume   Culture   Final    NO GROWTH 5 DAYS Performed at [facility], [address] [place], [place] [address]    Report Status 11/06/2022 FINAL  Final  MRSA Next Gen by PCR, Nasal     Status: None   Collection Time: 11/02/22  9:45 AM   Specimen: Nasal Mucosa; Nasal Swab  Result Value Ref Range Status   MRSA by PCR Next Gen NOT DETECTED NOT DETECTED Final    Comment: (NOTE) The GeneXpert MRSA Assay (FDA approved for NASAL specimens only), is one component of a comprehensive MRSA colonization surveillance program. It is not intended to diagnose MRSA infection nor to guide or monitor treatment for MRSA infections. Test performance is not FDA approved in patients less than 75 years old. Performed at [facility], [address] [place], [place] [address]     Procedures/Studies: DG Abd Portable 1V  Result Date: 11/04/2022 CLINICAL DATA:  Encounter for feeding tube placement. EXAM: PORTABLE ABDOMEN - 1 VIEW COMPARISON:  Upper abdominal radiograph 11/01/2022 FINDINGS: Interval removal of the prior nasogastric tube and interval placement of weighted tip enteric tube. The tip overlies the distal gastric antrum/pylorus. Nonobstructive bowel-gas pattern. IMPRESSION: Weighted tip enteric tube tip overlies the distal gastric antrum/pylorus. Electronically Signed   By: Neita Garnet M.D.   On: 11/04/2022 13:36   DG Abd Portable 1V  Result Date: 11/01/2022 CLINICAL DATA:  Nasogastric tube placement.  OG tube placement. EXAM: PORTABLE ABDOMEN - 1 VIEW COMPARISON:  None Available. FINDINGS: Tip and side port of the enteric tube below the diaphragm in the stomach. Nonobstructive upper abdominal bowel gas pattern. IMPRESSION: Tip and side port of the enteric tube below the diaphragm in the stomach. Electronically Signed   By: Narda Rutherford M.D.   On: 11/01/2022 16:49   MR Brain W and Wo  Contrast  Result Date: 11/01/2022 CLINICAL DATA:  Seizure, new-onset, no history of trauma Status epilepticus, status post brain tumor resection, hemorrhage versus mass on CT scan. EXAM: MRI HEAD WITHOUT AND WITH CONTRAST TECHNIQUE: Multiplanar, multiecho pulse sequences of the brain and surrounding structures were obtained without and with intravenous contrast. CONTRAST:  7mL GADAVIST GADOBUTROL 1 MMOL/ML IV SOLN COMPARISON:  Brain MRI 04/19/2022.  Head CT 10/31/2022. FINDINGS: Brain: Unchanged 6 mm focus of acute hemorrhage along the anterior margin of the right occipital resection cavity. No new or nodular enhancement to suggest local recurrence. Unchanged surrounding T2 hyperintensity. Trace subdural hemorrhage along the right parietal convexity (axial image 27 series 6).  No hydrocephalus, mass effect or midline shift. Vascular: Normal flow voids and vessel enhancement. Skull and upper cervical spine: Normal marrow signal and enhancement. Sinuses/Orbits: No acute findings. Other: None. IMPRESSION: 1. Unchanged 6 mm focus of acute hemorrhage along the anterior margin of the right occipital resection cavity. No new or nodular enhancement to suggest local recurrence. 2. Trace subdural hemorrhage along the right parietal convexity. Electronically Signed   By: Orvan Falconer M.D.   On: 11/01/2022 15:54   Overnight EEG with video  Result Date: 11/01/2022 Charlsie Quest, MD     11/02/2022  9:33 AM Patient Name: Stephen Beard MRN: [id number] Epilepsy Attending: Charlsie Quest Referring Physician/Provider: Milon Dikes, MD Duration: 10/31/2022 2303 to 11/01/2022 2303 Patient history: 27 year old with past medical history of anaplastic oligodendroglioma in the right parieto-occipital region status post resection/debulking complicated by intracerebral hematoma, status post chemotherapy, history of seizures on lamotrigine now with breakthrough seizure lasting 30 to 40 minutes with concern for status  epilepticus. EEG to evaluate for seizure Level of alertness:  comatose-->awake, asleep AEDs during EEG study: LEV, LTG, propofol Technical aspects: This EEG study was done with scalp electrodes positioned according to the 10-20 International system of electrode placement. Electrical activity was reviewed with band pass filter of 1-70Hz , sensitivity of 7 uV/mm, display speed of 74mm/sec with a 60Hz  notched filter applied as appropriate. EEG data were recorded continuously and digitally stored.  Video monitoring was available and reviewed as appropriate. Description: At the study, EEG showed continuous generalized 3 to 5 Hz 11 delta slowing with overriding 12 to 15 Hz beta activity.  Gradually as sedation was adjusted, EEG showed posterior dominant rhythm of 8-9 Hz activity of moderate voltage (25-35 uV) seen predominantly in posterior head regions, symmetric and reactive to eye opening and eye closing. Sleep was characterized by vertex waves, sleep spindles (12 to 14 Hz), maximal frontocentral region. Continuous 3 to 5 Hz theta-delta slowing was also noted in right parieto-occipital region. Hyperventilation and photic stimulation were not performed.   ABNORMALITY - Continuous slow, generalized and maximal right parieto-occipital region IMPRESSION: This study was initially suggestive of severe diffuse encephalopathy likely related to sedation.  Gradually sedation was adjusted, EEG improved.  Subsequently EEG was suggestive of cortical dysfunction in right parietal occipital region likely secondary to underlying structural abnormality.  No seizures or definite epileptiform discharges were seen during the study. Charlsie Quest   CT Head Wo Contrast  Result Date: 10/31/2022 CLINICAL DATA:  Seizure, history of brain tumor EXAM: CT HEAD WITHOUT CONTRAST TECHNIQUE: Contiguous axial images were obtained from the base of the skull through the vertex without intravenous contrast. RADIATION DOSE REDUCTION: This exam was  performed according to the departmental dose-optimization program which includes automated exposure control, adjustment of the mA and/or kV according to patient size and/or use of iterative reconstruction technique. COMPARISON:  05/31/2022 FINDINGS: Brain: Redemonstrated sequela of prior right occipital craniotomy with subjacent resection cavity and postoperative encephalomalacia. New hyperdense focus along the anterior aspect of the resection cavity, which measures 6 x 5 x 5 mm (AP x TR x CC) (series 3, image 21 and series 5, image 23). No evidence of acute infarct, mass effect, or midline shift. Redemonstrated ex vacuo dilatation of the right occipital and temporal horns. No new hydrocephalus. No extra-axial collection. Vascular: No hyperdense vessel. Skull: Prior right occipital craniotomy. Negative for fracture or focal lesion. Sinuses/Orbits: No acute finding. Other: The mastoids are well aerated. IMPRESSION: New 6 x 5 x 5  mm hyperdense focus along the anterior aspect of the right occipital resection cavity, concerning for recurrent tumor versus small focus of hemorrhage. MRI with and without contrast is recommended for further evaluation. These results were called by telephone at the time of interpretation on 10/31/2022 at 11:27 pm to provider Rancor , who verbally acknowledged these results. Electronically Signed   By: Wiliam Ke M.D.   On: 10/31/2022 23:31   DG Chest Portable 1 View  Result Date: 10/31/2022 CLINICAL DATA:  Post intubation.  Patient brought in due to seizure. EXAM: PORTABLE CHEST 1 VIEW COMPARISON:  Radiograph 03/23/2022 FINDINGS: Subdiaphragmatic enteric tube. Endotracheal tube tip in the intrathoracic trachea 5.0 cm from the carina. No focal consolidation, pleural effusion, or pneumothorax. No displaced rib fractures. Normal cardiomediastinal silhouette. IMPRESSION: Endotracheal tube tip in the intrathoracic trachea 5.0 cm from the carina Electronically Signed   By: Minerva Fester  M.D.   On: 10/31/2022 22:36    Labs: BNP (last 3 results) No results for input(s): "BNP" in the last 8760 hours. Basic Metabolic Panel: Recent Labs  Lab 11/01/22 0342 11/01/22 0606 11/01/22 1634 11/02/22 0526 11/02/22 1450 11/02/22 1821 11/03/22 0548 11/03/22 1757 11/05/22 0504  NA 135 135 138 134*  --   --  137  --  135  K 3.3* 3.3* 3.3* 3.7  --   --  3.3*  --  3.6  CL 104  --  104 106  --   --  111  --  102  CO2 19*  --  22 16*  --   --  18*  --  20*  GLUCOSE 93  --  79 76  --   --  130*  --  143*  BUN 16  --  10 8  --   --  7  --  <5*  CREATININE 1.00  --  1.22 1.12  --   --  0.78  --  0.57*  CALCIUM 7.8*  --  7.8* 7.5*  --   --  7.7*  --  9.2  MG 2.5*  --   --   --  1.9 2.0 1.8 2.4 2.0  PHOS 2.7  --   --   --  2.1* 2.1* 2.9 2.6 5.3*   Liver Function Tests: Recent Labs  Lab 10/31/22 2110  AST 83*  ALT 52*  ALKPHOS 74  BILITOT 0.5  PROT 7.5  ALBUMIN 4.5   No results for input(s): "LIPASE", "AMYLASE" in the last 168 hours. No results for input(s): "AMMONIA" in the last 168 hours. CBC: Recent Labs  Lab 10/31/22 2110 10/31/22 2158 11/01/22 0342 11/01/22 0606 11/02/22 0526 11/03/22 0548 11/05/22 0504  WBC 14.5*  --  9.9  --  6.9 5.5 6.5  NEUTROABS 8.3*  --   --   --   --   --   --   HGB 17.5*   < > 16.0 14.3 13.4 12.9* 15.0  HCT 53.4*   < > 45.0 42.0 38.3* 36.5* 41.5  MCV 102.1*  --  95.1  --  99.0 94.8 94.7  PLT 243  --  141*  --  117* 103* 143*   < > = values in this interval not displayed.   Cardiac Enzymes: No results for input(s): "CKTOTAL", "CKMB", "CKMBINDEX", "TROPONINI" in the last 168 hours. BNP: Invalid input(s): "POCBNP" CBG: Recent Labs  Lab 11/05/22 1647 11/06/22 1234 11/06/22 1605 11/06/22 2158 11/07/22 0629  GLUCAP 121* 143* 114* 110* 98   D-Dimer No results  for input(s): "DDIMER" in the last 72 hours. Hgb A1c No results for input(s): "HGBA1C" in the last 72 hours. Lipid Profile No results for input(s): "CHOL", "HDL",  "LDLCALC", "TRIG", "CHOLHDL", "LDLDIRECT" in the last 72 hours. Thyroid function studies No results for input(s): "TSH", "T4TOTAL", "T3FREE", "THYROIDAB" in the last 72 hours.  Invalid input(s): "FREET3" Anemia work up No results for input(s): "VITAMINB12", "FOLATE", "FERRITIN", "TIBC", "IRON", "RETICCTPCT" in the last 72 hours. Urinalysis    Component Value Date/Time   COLORURINE YELLOW 11/01/2022 0641   APPEARANCEUR CLOUDY (A) 11/01/2022 0641   LABSPEC 1.020 11/01/2022 0641   PHURINE 5.0 11/01/2022 0641   GLUCOSEU NEGATIVE 11/01/2022 0641   HGBUR SMALL (A) 11/01/2022 0641   BILIRUBINUR NEGATIVE 11/01/2022 0641   KETONESUR 20 (A) 11/01/2022 0641   PROTEINUR 100 (A) 11/01/2022 0641   UROBILINOGEN 1.0 05/07/2014 1307   NITRITE NEGATIVE 11/01/2022 0641   LEUKOCYTESUR NEGATIVE 11/01/2022 0641   Sepsis Labs Recent Labs  Lab 11/01/22 0342 11/02/22 0526 11/03/22 0548 11/05/22 0504  WBC 9.9 6.9 5.5 6.5   Microbiology Recent Results (from the past 240 hour(s))  Culture, blood (routine x 2)     Status: None   Collection Time: 11/01/22  1:13 AM   Specimen: BLOOD RIGHT HAND  Result Value Ref Range Status   Specimen Description BLOOD RIGHT HAND  Final   Special Requests   Final    BOTTLES DRAWN AEROBIC AND ANAEROBIC Blood Culture adequate volume   Culture   Final    NO GROWTH 5 DAYS Performed at [facility], [address] [place], [place] [address]    Report Status 11/06/2022 FINAL  Final  Culture, blood (routine x 2)     Status: None   Collection Time: 11/01/22  1:13 AM   Specimen: BLOOD LEFT HAND  Result Value Ref Range Status   Specimen Description BLOOD LEFT HAND  Final   Special Requests   Final    BOTTLES DRAWN AEROBIC AND ANAEROBIC Blood Culture adequate volume   Culture   Final    NO GROWTH 5 DAYS Performed at [facility], [address] [place], [place] [address]    Report Status 11/06/2022 FINAL  Final  MRSA Next Gen by PCR, Nasal     Status:  None   Collection Time: 11/02/22  9:45 AM   Specimen: Nasal Mucosa; Nasal Swab  Result Value Ref Range Status   MRSA by PCR Next Gen NOT DETECTED NOT DETECTED Final    Comment: (NOTE) The GeneXpert MRSA Assay (FDA approved for NASAL specimens only), is one component of a comprehensive MRSA colonization surveillance program. It is not intended to diagnose MRSA infection nor to guide or monitor treatment for MRSA infections. Test performance is not FDA approved in patients less than 109 years old. Performed at [facility], [address] [place], [place] [address]    Time coordinating discharge: 25 minutes  SIGNED: Lanae Boast, MD  [facility] 11/07/2022, 10:45 AM  If 7PM-7AM, please contact night-coverage [email or web address]

## 2022-11-07 NOTE — TOC Transition Note (Signed)
Transition of Care [facility]) - CM/SW Discharge Note   Patient Details  Name: Stephen Beard MRN: [id number] Date of Birth: 01-22-1996  [facility] of Care [facility]) CM/SW Contact:  Kermit Balo, RN Phone Number: 11/07/2022, 1:01 PM   Clinical Narrative:     Pt is discharging to CIR today. CM signing off.    Final next level of care: IP Rehab Facility Barriers to Discharge: No Barriers Identified   Patient Goals and CMS Choice CMS Medicare.gov Compare Post Acute Care list provided to:: Patient Choice offered to / list presented to : Patient  Discharge Placement                         Discharge Plan and Services Additional resources added to the After Visit Summary for                                       Social Determinants of Health (SDOH) Interventions SDOH Screenings   Food Insecurity: No Food Insecurity (11/05/2022)  Housing: Patient Declined (11/05/2022)  Transportation Needs: No Transportation Needs (11/05/2022)  Utilities: Not At Risk (11/05/2022)  Alcohol Screen: Medium Risk (01/07/2020)  Depression (PHQ2-9): Medium Risk (05/12/2020)  Financial Resource Strain: Medium Risk (01/07/2022)  Social Connections: Moderately Integrated (01/07/2022)  Tobacco Use: High Risk (10/31/2022)     Readmission Risk Interventions    05/27/2021   11:57 AM  Readmission Risk Prevention Plan  Transportation Screening Complete  PCP or Specialist Appt within 3-5 Days Complete  HRI or Home Care Consult Complete  Social Work Consult for Recovery Care Planning/Counseling Complete  Palliative Care Screening Not Applicable  Medication Review Oceanographer) Complete

## 2022-11-07 NOTE — Progress Notes (Signed)
Inpatient Rehabilitation Admission Medication Review by a Pharmacist  A complete drug regimen review was completed for this patient to identify any potential clinically significant medication issues.  High Risk Drug Classes Is patient taking? Indication by Medication  Antipsychotic Yes Compazine- N/V  Anticoagulant Yes Lovenox- vte ppx  Antibiotic No   Opioid No   Antiplatelet No   Hypoglycemics/insulin No   Vasoactive Medication No   Chemotherapy No   Other Yes Benadryl- itching Melatonin- sleep Lamictal- seizure ppx Ativan- breakthrough seizures     Type of Medication Issue Identified Description of Issue Recommendation(s)  Drug Interaction(s) (clinically significant)     Duplicate Therapy     Allergy     No Medication Administration End Date     Incorrect Dose     Additional Drug Therapy Needed     Significant med changes from prior encounter (inform family/care partners about these prior to discharge).    Other  PTA meds: Valtoco Restart PTA meds when and if necessary during CIR admission or at time of discharge, if warranted    Clinically significant medication issues were identified that warrant physician communication and completion of prescribed/recommended actions by midnight of the next day:  No  Time spent performing this drug regimen review (minutes):  30   Stephen Beard BS, PharmD, BCPS Clinical Pharmacist 11/07/2022 2:10 PM  Contact: [phone] after 3 PM  "Be curious, not judgmental..." -Debbora Dus

## 2022-11-07 NOTE — Care Management Important Message (Signed)
Important Message  Patient Details  Name: Stephen Beard MRN: [id number] Date of Birth: [birth year]   Medicare Important Message Given:  Yes  Patient left prior to IM delivery will mail a copy to the patients  home address.    [name] 11/07/2022, 3:22 PM

## 2022-11-07 NOTE — Progress Notes (Signed)
Signed     Expand All Collapse All PMR Admission Coordinator Pre-Admission Assessment   Patient: Stephen Beard is an 27 y.o., male MRN: [id number] DOB: 15-May-1995 Height: 5\' 8"  (172.7 cm) Weight: 61.9 kg   Insurance Information HMO:     PPO:      PCP:      IPA:      80/20: yes     OTHER:  PRIMARY: Medicare A & B      Policy#: [id number]      Subscriber: patient CM Name:       Phone#:      Fax#:  Pre-Cert#:       Employer:  Benefits:  Phone #: verified eligibility via [employer] on 11/07/22     Name:  Eff. Date: Part A & B effective 12/12/21     Deduct: $16,32      Out of Pocket Max: NA      Life Max: NA CIR: 100% coverage      SNF: 100% coverage for days 1-20, 80% coverage for days 21-100 Outpatient: 80% coverage     Co-Pay: 20% Home Health: 100% coverage      Co-Pay:  DME: 80% coverage     Co-Pay: 20% Providers: pt's choice SECONDARY:       Policy#:      Phone#:    Artist:       Phone#:    The Engineer, materials Information Summary" for patients in Inpatient Rehabilitation Facilities with attached "Privacy Act Statement-Health Care Records" was provided and verbally reviewed with: Patient   Emergency Contact Information Contact Information       Name Relation Home Work Mobile    [name] Mother [phone]   [phone]    Stephen Beard     [phone]         Other Contacts       Name Relation Home Work Mobile    [name],[name] Stepfather     [phone]           Current Medical History  Patient Admitting Diagnosis: debility s/p seizures and SDH History of Present Illness: Pt is a 27 year old male with medical hx significant for: 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 Pt presented to [facility] on 10/31/22 d/t prolonged generalized tonic-clonic seizure. Pt was seizing ~30-40 minutes. Pt given IV Versed. Pt had agonal  respirations and sats in low 80s on 15L NRB in ED. Pt emergently intubated. CT head new hyperdensity concerning for recurrent tumor versus hemorrhage. LTM EEG from 8/19-8/20 was suggestive of cortical dysfunction in right parietal occipital region likely secondary to underlying structural abnormality. No seizures seen. MRI showed trach SDH and uncharged acute hemorrhage in area of resection, no evidence of tumor resection. LTM EEG from 8/20-8/21 was suggestive of cortical dysfunction in right parieto-occipital region likely secondary to underlying structural abnormality; moderate to severe diffuse encephalopathy. No seizures seen. LTM EEG from 8/21-8/22 was suggestive of mild diffuse encephalopathy. No seizures seen. LTM EEG from 8/22-8/23 was suggestive of epileptogenicity and cortical dysfunction in right parieto-occipital region likely secondary to underlying structural abnormality. There was mild diffuse encephalopathy. No seizures seen. Pt extubated on 11/04/22. LTM EEG from 8/23-8/24 was suggestive of epileptogenicity and cortical dysfunction in right parieto-occipital region likely secondary to underlying structural abnormality. There was mild diffuse encephalopathy. No seizures seen. LTM EEG discontinued on 8/24. Therapy evaluations completed and CIR recommended d/t pt's deficits in functional mobility.  Patient's medical record from [facility] has been reviewed by the rehabilitation admission coordinator and physician.   Past Medical History      Past Medical History:  Diagnosis Date   ADD (attention deficit disorder)     ADHD     Bifrontal oligodendroglioma (HCC)     GAD (generalized anxiety disorder)     Headache     Intentional overdose (HCC) 12/2019   Psychotic disorder (HCC)     Schizoaffective disorder (HCC)     Seizures (HCC)     Substance abuse (HCC)     Suicidal ideation            Has the patient had major surgery during 100 days prior to admission? No   Family  History   family history is not on file.   Current Medications  Current Medications    Current Facility-Administered Medications:    0.9 %  sodium chloride infusion, 250 mL, Intravenous, Continuous, Sood, Vineet, MD, Last Rate: 10 mL/hr at 11/05/22 1532, Infusion Verify at 11/05/22 1532   docusate (COLACE) 50 MG/5ML liquid 100 mg, 100 mg, Per Tube, BID PRN, Coralyn Helling, MD, 100 mg at 11/02/22 1610   food thickener (SIMPLYTHICK (NECTAR/LEVEL 2/MILDLY THICK)) 5 packet, 5 packet, Oral, PRN, Coralyn Helling, MD   heparin injection 5,000 Units, 5,000 Units, Subcutaneous, Q8H, Coralyn Helling, MD, 5,000 Units at 11/07/22 0537   lamoTRIgine (LAMICTAL) tablet 250 mg, 250 mg, Per Tube, BID, Craige Cotta, Vineet, MD, 250 mg at 11/07/22 1026   loratadine (CLARITIN) tablet 10 mg, 10 mg, Oral, Daily, Kc, Ramesh, MD, 10 mg at 11/07/22 1026   LORazepam (ATIVAN) injection 2 mg, 2 mg, Intravenous, PRN, Coralyn Helling, MD   melatonin tablet 3 mg, 3 mg, Oral, QHS PRN, Opyd, Lavone Neri, MD, 3 mg at 11/06/22 2112   Oral care mouth rinse, 15 mL, Mouth Rinse, PRN, Craige Cotta, Vineet, MD   polyethylene glycol (MIRALAX / GLYCOLAX) packet 17 g, 17 g, Per Tube, Daily PRN, Coralyn Helling, MD     Patients Current Diet:  Diet Order                  Diet regular Room service appropriate? Yes; Fluid consistency: Thin  Diet effective now                         Precautions / Restrictions Precautions Precautions: Fall Precaution Comments: seizure Restrictions Weight Bearing Restrictions: No    Has the patient had 2 or more falls or a fall with injury in the past year? Yes   Prior Activity Level Community (5-7x/wk): gets out of house ~3 days/week   Prior Functional Level Self Care: Did the patient need help bathing, dressing, using the toilet or eating? Independent   Indoor Mobility: Did the patient need assistance with walking from room to room (with or without device)? Independent   Stairs: Did the patient need assistance  with internal or external stairs (with or without device)? Independent   Functional Cognition: Did the patient need help planning regular tasks such as shopping or remembering to take medications? Needed some help   Patient Information Are you of Hispanic, Latino/a,or Spanish origin?: A. No, not of Hispanic, Latino/a, or Spanish origin What is your race?: A. White Do you need or want an interpreter to communicate with a doctor or health care staff?: 0. No   Patient's Response To:  Health Literacy and Transportation Is the patient able to respond to  health literacy and transportation needs?: Yes Health Literacy - How often do you need to have someone help you when you read instructions, pamphlets, or other written material from your doctor or pharmacy?: Never In the past 12 months, has lack of transportation kept you from medical appointments or from getting medications?: No In the past 12 months, has lack of transportation kept you from meetings, work, or from getting things needed for daily living?: No   Home Assistive Devices / Equipment Home Assistive Devices/Equipment: Environmental consultant (specify type) Home Equipment: Rolling Walker (2 wheels)   Prior Device Use: Indicate devices/aids used by the patient prior to current illness, exacerbation or injury? None of the above   Current Functional Level Cognition   Overall Cognitive Status: Impaired/Different from baseline Current Attention Level: Selective Orientation Level: Oriented X4 Following Commands: Follows multi-step commands with increased time Safety/Judgement: Decreased awareness of safety, Decreased awareness of deficits General Comments: pt needed increased time and mod cues to locate his room. pt noted to have a R to L scanning pattern when looking for cues. pt verbalizing "L is hot R is not" as a memory cue to use the sink. pt verbalized needing these cues to help him sequence task to ensure he gets them correct at this time. Pt was  able to name >11 animals in 1 minute but when paired with walking greatly decreased his gait velocity. pt shows deficits with dual task. [employer] Scales of Cognitive Functioning: Confused, Appropriate: Moderate Assistance    Extremity Assessment (includes Sensation/Coordination)   Upper Extremity Assessment: Right hand dominant RUE Deficits / Details: decreased fine motor with visual component noted. mother states he could normally do that normal speed without any trouble. pt needed cues to add the cap back to the paste and increased time with head tilt to apply the cap LUE Deficits / Details: Overall WFL, sensation is [facility], however he is unable to feel both arms when touched at the same time  Lower Extremity Assessment: Defer to PT evaluation LLE Deficits / Details: noted to favor L LE and less clearing of the foot LLE Sensation: decreased light touch, decreased proprioception LLE Coordination: decreased gross motor     ADLs   Overall ADL's : Needs assistance/impaired Eating/Feeding: Moderate assistance Eating/Feeding Details (indicate cue type and reason): cortrak, nectar liquids Grooming: Contact guard assist, Standing, Oral care, Wash/dry face Upper Body Bathing: Minimal assistance, Sitting Lower Body Bathing: Moderate assistance, Sit to/from stand Upper Body Dressing : Minimal assistance, Sitting Lower Body Dressing: Moderate assistance, Sit to/from stand Toilet Transfer: Contact guard assist Toileting- Clothing Manipulation and Hygiene: Minimal assistance, Sit to/from stand Functional mobility during ADLs: Contact guard assist General ADL Comments: assist for balance and cognition     Mobility   Overal bed mobility: Modified Independent Bed Mobility: Supine to Sit Supine to sit: Min assist General bed mobility comments: HOB 20 degrees no rail used     Transfers   Overall transfer level: Needs assistance Equipment used: Rolling walker (2 wheels), 1 person hand held  assist Transfers: Sit to/from Stand Sit to Stand: Contact guard assist General transfer comment: RW used initally, pt does better with HHA to eliminate the need to problem solve RW management. posterior bias initially upon standing     Ambulation / Gait / Stairs / Wheelchair Mobility   Ambulation/Gait Ambulation/Gait assistance: Mod assist, +2 safety/equipment Gait Distance (Feet): 60 Feet Assistive device: Rolling walker (2 wheels), 1 person hand held assist Gait Pattern/deviations: Step-through pattern, Shuffle General  Gait Details: pt with small, shuffling steps at first, gait lengthened slightly with distance. Pt running into door on L and could not problem solve how to get around it, finally needed manual facilitation. Also became "stuck" when reentering room and could not figure out how to ambulate around the bed to the chair Gait velocity: decreased Gait velocity interpretation: <1.31 ft/sec, indicative of household ambulator     Posture / Balance Balance Overall balance assessment: Needs assistance Sitting-balance support: No upper extremity supported, Feet supported Sitting balance-Leahy Scale: Fair Standing balance support: Single extremity supported, During functional activity Standing balance-Leahy Scale: Poor Standing balance comment: unsteady in standing and at times startles which throws him off balance High level balance activites: Side stepping, Direction changes High Level Balance Comments: pt needs increased time and putting hands out to balance himself to turn.     Special needs/care consideration Continuous Drip IV  0.9% sodium chloride infusion    Previous Home Environment (from acute therapy documentation) Living Arrangements: Parent  Lives With: Family (mother and stepfather) Available Help at Discharge: Family, Available 24 hours/day Type of Home: Apartment Home Layout: One level Home Access: Level entry Bathroom Shower/Tub: Engineer, manufacturing systems:  Standard Bathroom Accessibility: Yes How Accessible: Accessible via walker Home Care Services: No Additional Comments: lives with mom, step-dad and uncle with Down Syndrome   Discharge Living Setting Plans for Discharge Living Setting: Patient's home Type of Home at Discharge: Apartment Discharge Home Layout: One level Discharge Home Access: Level entry Discharge Bathroom Shower/Tub: Tub/shower unit Discharge Bathroom Toilet: Standard Discharge Bathroom Accessibility: Yes How Accessible: Accessible via walker Does the patient have any problems obtaining your medications?: No   Social/Family/Support Systems Anticipated Caregiver: Thomes Lolling, mother Anticipated Industrial/product designer Information: [phone] Caregiver Availability: 24/7 Discharge Plan Discussed with Primary Caregiver: Yes Is Caregiver In Agreement with Plan?: Yes Does Caregiver/Family have Issues with Lodging/Transportation while Pt is in Rehab?: No   Goals Patient/Family Goal for Rehab: Supervision: PT/OT, Mod I: ST Expected length of stay: 7-10 days Pt/Family Agrees to Admission and willing to participate: Yes Program Orientation Provided & Reviewed with Pt/Caregiver Including Roles  & Responsibilities: Yes   Decrease burden of Care through IP rehab admission: NA   Possible need for SNF placement upon discharge: Not anticipated   Patient Condition: I have reviewed medical records from [facility], spoken with CM, and patient and family member. I met with patient at the bedside for inpatient rehabilitation assessment.  Patient will benefit from ongoing PT, OT, and SLP, can actively participate in 3 hours of therapy a day 5 days of the week, and can make measurable gains during the admission.  Patient will also benefit from the coordinated team approach during an Inpatient Acute Rehabilitation admission.  The patient will receive intensive therapy as well as Rehabilitation physician, nursing, social worker,  and care management interventions.  Due to safety, disease management, medication administration, pain management, and patient education the patient requires 24 hour a day rehabilitation nursing.  The patient is currently Min-Mod A with mobility and Min-Mod A with basic ADLs.  Discharge setting and therapy post discharge at home with home health is anticipated.  Patient has agreed to participate in the Acute Inpatient Rehabilitation Program and will admit today.   Preadmission Screen Completed By:  Domingo Pulse, 11/07/2022 11:45 AM ______________________________________________________________________   Discussed status with Dr. Natale Lay on 11/07/22  at 11:45 AM and received approval for admission today.   Admission Coordinator:  Ralph Leyden  Theodoro Kos, CCC-SLP, time 11:45 AM/Date 11/07/22     Assessment/Plan: Diagnosis: Epilepsy with breakthrough seizures with hx of anaplastic oligodendroglioma status post excision and chemotherapy  Does the need for close, 24 hr/day Medical supervision in concert with the patient's rehab needs make it unreasonable for this patient to be served in a less intensive setting? Yes Co-Morbidities requiring supervision/potential complications: Aspiration PNA, Hypokalemia, Dysphagia, status epilepticus, AKI, Acute respiratory failure Due to bladder management, bowel management, safety, skin/wound care, disease management, medication administration, pain management, and patient education, does the patient require 24 hr/day rehab nursing? Yes Does the patient require coordinated care of a physician, rehab nurse, PT, OT, and SLP to address physical and functional deficits in the context of the above medical diagnosis(es)? Yes Addressing deficits in the following areas: balance, endurance, locomotion, strength, transferring, bowel/bladder control, bathing, dressing, feeding, grooming, toileting, cognition, speech, language, swallowing, and psychosocial support Can  the patient actively participate in an intensive therapy program of at least 3 hrs of therapy 5 days a week? Yes The potential for patient to make measurable gains while on inpatient rehab is excellent Anticipated functional outcomes upon discharge from inpatient rehab: supervision PT, supervision OT, modified independent SLP Estimated rehab length of stay to reach the above functional goals is: 7-10 Anticipated discharge destination: Home 10. Overall Rehab/Functional Prognosis: good     MD Signature: Fanny Dance

## 2022-11-07 NOTE — Plan of Care (Signed)
  Problem: Education: Goal: Ability to describe self-care measures that may prevent or decrease complications (Diabetes Survival Skills Education) will improve Outcome: Progressing Goal: Individualized Educational Video(s) Outcome: Progressing   Problem: Coping: Goal: Ability to adjust to condition or change in health will improve Outcome: Progressing   Problem: Fluid Volume: Goal: Ability to maintain a balanced intake and output will improve Outcome: Progressing   Problem: Health Behavior/Discharge Planning: Goal: Ability to identify and utilize available resources and services will improve Outcome: Progressing Goal: Ability to manage health-related needs will improve Outcome: Progressing   Problem: Metabolic: Goal: Ability to maintain appropriate glucose levels will improve Outcome: Progressing   Problem: Nutritional: Goal: Maintenance of adequate nutrition will improve Outcome: Progressing Goal: Progress toward achieving an optimal weight will improve Outcome: Progressing   Problem: Skin Integrity: Goal: Risk for impaired skin integrity will decrease Outcome: Progressing   Problem: Tissue Perfusion: Goal: Adequacy of tissue perfusion will improve Outcome: Progressing   Problem: Education: Goal: Knowledge of General Education information will improve Description: Including pain rating scale, medication(s)/side effects and non-pharmacologic comfort measures Outcome: Progressing   Problem: Health Behavior/Discharge Planning: Goal: Ability to manage health-related needs will improve Outcome: Progressing   Problem: Clinical Measurements: Goal: Ability to maintain clinical measurements within normal limits will improve Outcome: Progressing Goal: Will remain free from infection Outcome: Progressing Goal: Diagnostic test results will improve Outcome: Progressing Goal: Respiratory complications will improve Outcome: Progressing Goal: Cardiovascular complication will  be avoided Outcome: Progressing   Problem: Activity: Goal: Risk for activity intolerance will decrease Outcome: Progressing   Problem: Nutrition: Goal: Adequate nutrition will be maintained Outcome: Progressing   Problem: Coping: Goal: Level of anxiety will decrease Outcome: Progressing   Problem: Elimination: Goal: Will not experience complications related to bowel motility Outcome: Progressing Goal: Will not experience complications related to urinary retention Outcome: Progressing   Problem: Pain Managment: Goal: General experience of comfort will improve Outcome: Progressing   Problem: Safety: Goal: Ability to remain free from injury will improve Outcome: Progressing   Problem: Skin Integrity: Goal: Risk for impaired skin integrity will decrease Outcome: Progressing   Problem: Activity: Goal: Ability to tolerate increased activity will improve Outcome: Progressing   Problem: Respiratory: Goal: Ability to maintain a clear airway and adequate ventilation will improve Outcome: Progressing   Problem: Role Relationship: Goal: Method of communication will improve Outcome: Progressing   Problem: Safety: Goal: Non-violent Restraint(s) Outcome: Progressing   

## 2022-11-07 NOTE — Progress Notes (Signed)
Occupational Therapy Treatment Patient Details Name: Stephen Beard MRN: [id number] DOB: 14-May-1995 Today's Date: 11/07/2022   History of present illness 27 y.o. male who presents on 10/31/22 with seizure >30 mins.  EEG consistent with epilepsy and cortical dysfunction arising from R hemisphere, also moderate diffuse encephalopathy (same as EEG on admission a year ago). MRI showed trace SDH along the R parietal convexity. 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   OT comments  Pt verbalized with mother present that he prefer to be considered for CIR so CIR coordinator contacted. Pt demonstrates deficits with balance, problem solving and visual deficits. Patient will benefit from intensive inpatient follow up therapy, >3 hours/day       If plan is discharge home, recommend the following:  Assistance with cooking/housework;Direct supervision/assist for medications management;Direct supervision/assist for financial management;Assist for transportation;Help with stairs or ramp for entrance;A little help with walking and/or transfers;A little help with bathing/dressing/bathroom   Equipment Recommendations  None recommended by OT    Recommendations for Other Services Rehab consult    Precautions / Restrictions Precautions Precautions: Fall Precaution Comments: seizure       Mobility Bed Mobility Overal bed mobility: Modified Independent             General bed mobility comments: HOB 20 degrees no rail used    Transfers Overall transfer level: Needs assistance   Transfers: Sit to/from Stand Sit to Stand: Contact guard assist                 Balance Overall balance assessment: Needs assistance Sitting-balance support: No upper extremity supported, Feet supported Sitting balance-Leahy Scale: Fair     Standing balance support: Single  extremity supported, During functional activity Standing balance-Leahy Scale: Poor               High level balance activites: Side stepping, Direction changes High Level Balance Comments: pt needs increased time and putting hands out to balance himself to turn.           ADL either performed or assessed with clinical judgement   ADL Overall ADL's : Needs assistance/impaired     Grooming: Contact guard assist;Standing;Oral care;Wash/dry face                   Toilet Transfer: Contact guard assist           Functional mobility during ADLs: Contact guard assist      Extremity/Trunk Assessment Upper Extremity Assessment Upper Extremity Assessment: Right hand dominant RUE Deficits / Details: decreased fine motor with visual component noted. mother states he could normally do that normal speed without any trouble. pt needed cues to add the cap back to the paste and increased time with head tilt to apply the cap   Lower Extremity Assessment Lower Extremity Assessment: Defer to PT evaluation LLE Deficits / Details: noted to favor L LE and less clearing of the foot        Vision   Vision Assessment?: Vision impaired- to be further tested in functional context Additional Comments: pt with glasses this session and noted to use head tilt and R to L scanning sequence. when asked which direction to read his bible pt currently startes L side. Pt reports things at a distance are easier to see than upclose at this time. pt wearing glasses in session with increased vision acuity but still reports "blurry vision" compared to baseline.  Perception     Praxis      Cognition Arousal: Alert Behavior During Therapy: WFL for tasks assessed/performed Overall Cognitive Status: Impaired/Different from baseline Area of Impairment: Memory, Following commands, Safety/judgement, Awareness, Problem solving                 Orientation Level: Disoriented to, Situation Current  Attention Level: Selective Memory: Decreased short-term memory Following Commands: Follows multi-step commands with increased time   Awareness: Intellectual Problem Solving: Slow processing, Difficulty sequencing General Comments: pt needed increased time and mod cues to locate his room. pt noted to have a R to L scanning pattern when looking for cues. pt verbalizing "L is hot R is not" as a memory cue to use the sink. pt verbalized needing these cues to help him sequence task to ensure he gets them correct at this time. Pt was able to name >11 animals in 1 minute but when paired with walking greatly decreased his gait velocity. pt shows deficits with dual task.        Exercises      Shoulder Instructions       General Comments RA    Pertinent Vitals/ Pain       Pain Assessment Pain Assessment: No/denies pain  Home Living                                          Prior Functioning/Environment              Frequency  Min 1X/week        Progress Toward Goals  OT Goals(current goals can now be found in the care plan section)  Progress towards OT goals: Progressing toward goals  Acute Rehab OT Goals Patient Stated Goal: to get rehab so i can get better faster OT Goal Formulation: With patient Time For Goal Achievement: 11/19/22 Potential to Achieve Goals: Good ADL Goals Pt Will Perform Grooming: with supervision;standing Pt Will Perform Upper Body Dressing: with modified independence Pt Will Transfer to Toilet: with supervision;ambulating Additional ADL Goal #1: pt will complete 3 step way finding task with min cues as a precursor to higher level IADLs Additional ADL Goal #2: Pt will indep sequence an ADL task  Plan      Co-evaluation                 AM-PAC OT "6 Clicks" Daily Activity     Outcome Measure   Help from another person eating meals?: A Little Help from another person taking care of personal grooming?: A Little Help from  another person toileting, which includes using toliet, bedpan, or urinal?: A Little Help from another person bathing (including washing, rinsing, drying)?: A Little Help from another person to put on and taking off regular upper body clothing?: A Little Help from another person to put on and taking off regular lower body clothing?: A Little 6 Click Score: 18    End of Session Equipment Utilized During Treatment: Gait belt  OT Visit Diagnosis: Unsteadiness on feet (R26.81);Other abnormalities of gait and mobility (R26.89);Muscle weakness (generalized) (M62.81);Other symptoms and signs involving cognitive function   Activity Tolerance Patient tolerated treatment well   Patient Left in chair;with call bell/phone within reach;with family/visitor present   Nurse Communication Mobility status;Precautions        Time: 3664-4034 OT Time Calculation (min): 30 min  Charges: OT General Charges $OT Visit:  1 Visit OT Treatments $Self Care/Home Management : 8-22 mins $Cognitive Funtion inital: Initial 15 mins   Brynn, OTR/L  Acute Rehabilitation Services Office: [phone] .   Mateo Flow 11/07/2022, 9:02 AM

## 2022-11-08 ENCOUNTER — Inpatient Hospital Stay: Payer: Medicare Other | Admitting: Internal Medicine

## 2022-11-08 DIAGNOSIS — I6200 Nontraumatic subdural hemorrhage, unspecified: Secondary | ICD-10-CM | POA: Diagnosis not present

## 2022-11-08 LAB — COMPREHENSIVE METABOLIC PANEL
ALT: 102 U/L — ABNORMAL HIGH (ref 0–44)
AST: 72 U/L — ABNORMAL HIGH (ref 15–41)
Albumin: 3.9 g/dL (ref 3.5–5.0)
Alkaline Phosphatase: 66 U/L (ref 38–126)
Anion gap: 8 (ref 5–15)
BUN: 17 mg/dL (ref 6–20)
CO2: 24 mmol/L (ref 22–32)
Calcium: 9.3 mg/dL (ref 8.9–10.3)
Chloride: 103 mmol/L (ref 98–111)
Creatinine, Ser: 1.24 mg/dL (ref 0.61–1.24)
GFR, Estimated: >60 mL/min (ref >60)
Glucose, Bld: 92 mg/dL (ref 70–99)
Potassium: 4.2 mmol/L (ref 3.5–5.1)
Sodium: 135 mmol/L (ref 135–145)
Total Bilirubin: 0.7 mg/dL (ref 0.3–1.2)
Total Protein: 7.2 g/dL (ref 6.5–8.1)

## 2022-11-08 LAB — CBC WITH DIFFERENTIAL/PLATELET
Abs Immature Granulocytes: 0.1 10*3/uL — ABNORMAL HIGH (ref 0.00–0.07)
Basophils Absolute: 0.1 10*3/uL (ref 0.0–0.1)
Basophils Relative: 1 %
Eosinophils Absolute: 0.1 10*3/uL (ref 0.0–0.5)
Eosinophils Relative: 2 %
HCT: 47 % (ref 39.0–52.0)
Hemoglobin: 16.3 g/dL (ref 13.0–17.0)
Immature Granulocytes: 2 %
Lymphocytes Relative: 25 %
Lymphs Abs: 1.3 10*3/uL (ref 0.7–4.0)
MCH: 33.9 pg (ref 26.0–34.0)
MCHC: 34.7 g/dL (ref 30.0–36.0)
MCV: 97.7 fL (ref 80.0–100.0)
Monocytes Absolute: 0.9 10*3/uL (ref 0.1–1.0)
Monocytes Relative: 17 %
Neutro Abs: 2.8 10*3/uL (ref 1.7–7.7)
Neutrophils Relative %: 53 %
Platelets: 279 10*3/uL (ref 150–400)
RBC: 4.81 MIL/uL (ref 4.22–5.81)
RDW: 11.8 % (ref 11.5–15.5)
WBC: 5.3 10*3/uL (ref 4.0–10.5)
nRBC: 0 % (ref 0.0–0.2)

## 2022-11-08 NOTE — Progress Notes (Signed)
Patient ID: Stephen Beard, male   DOB: Feb 01, 1996, 27 y.o.   MRN: [id number]  SW met with pt in room to provide updates from team conference, and inform on d/c date 9/3. He is aware SW will follow-up with his mother.   Cecile Sheerer, MSW, LCSWA Office: [phone] Cell: [phone] Fax: [phone]

## 2022-11-08 NOTE — Progress Notes (Signed)
PROGRESS NOTE   Subjective/Complaints:  No events overnight Labs stable, vitals stable Continent b/b, LBM yesterday.  No complaints.  Working well with SLP services.  ROS: Denies fevers, chills, N/V, abdominal pain, constipation, diarrhea, SOB, cough, chest pain, new weakness or paraesthesias.    Objective:   No results found. Recent Labs    11/08/22 0803  WBC 5.3  HGB 16.3  HCT 47.0  PLT 279   Recent Labs    11/08/22 0803  NA 135  K 4.2  CL 103  CO2 24  GLUCOSE 92  BUN 17  CREATININE 1.24  CALCIUM 9.3    Intake/Output Summary (Last 24 hours) at 11/08/2022 4098 Last data filed at 11/08/2022 0803 Gross per 24 hour  Intake 358 ml  Output --  Net 358 ml        Physical Exam: Vital Signs Blood pressure 111/75, pulse (!) 55, temperature 97.8 F (36.6 C), resp. rate 18, height 5\' 7"  (1.702 m), weight 63 kg, SpO2 99%.   PE: Constitution: Appropriate appearance for age. No apparent distress  Resp: No respiratory distress. No accessory muscle usage. on RA and CTAB Cardio: Well perfused appearance. No m/r/g. RRR. No peripheral edema. Abdomen: Nondistended. Nontender.  +BS Psych: Appropriate mood and affect. Neuro: AAOx4. No apparent cognitive deficits  + Bilateral nystagmus with EOMI  Neurologic Exam:   Sensory exam: revealed normal sensation in all dermatomal regions in bilateral upper extremities and bilateral lower extremities Motor exam: strength 5/5 throughout bilateral upper extremities and bilateral lower extremities Coordination: Fine motor coordination was normal on gross exam.      Assessment/Plan: 1. Functional deficits which require 3+ hours per day of interdisciplinary therapy in a comprehensive inpatient rehab setting. Physiatrist is providing close team supervision and 24 hour management of active medical problems listed below. Physiatrist and rehab team continue to assess barriers to  discharge/monitor patient progress toward functional and medical goals  Care Tool:  Bathing              Bathing assist       Upper Body Dressing/Undressing Upper body dressing        Upper body assist      Lower Body Dressing/Undressing Lower body dressing            Lower body assist       Toileting Toileting    Toileting assist       Transfers Chair/bed transfer  Transfers assist           Locomotion Ambulation   Ambulation assist              Walk 10 feet activity   Assist           Walk 50 feet activity   Assist           Walk 150 feet activity   Assist           Walk 10 feet on uneven surface  activity   Assist           Wheelchair     Assist               Wheelchair 50 feet  with 2 turns activity    Assist            Wheelchair 150 feet activity     Assist          Blood pressure 111/75, pulse (!) 55, temperature 97.8 F (36.6 C), resp. rate 18, height 5\' 7"  (1.702 m), weight 63 kg, SpO2 99%.  Medical Problem List and Plan: 1. 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  Antithrombotics: -DVT/anticoagulation:  Pharmaceutical: Lovenox             -antiplatelet therapy: N/A 3. Pain Management:  Lidocaine patch added for mid back pain/discomfort.  4. Mood/Behavior/Sleep: Melatonin 5 mg prn (used a couple pills prn). Trazodone worked but does not like hangover effect-->"does not like strong medication"             -antipsychotic agents: N/A--quit meds. Has been to "mental hospital" X 2  -8/27: No sleep difficulties, behavior  appropriate.  Monitor. 5. Neuropsych/cognition: This patient may be intermittently capable of making decisions on his own behalf. 6. Skin/Wound Care: Routine pressure relief measures.  7. Fluids/Electrolytes/Nutrition: Monitor I/O. Check CMET in am -admission labs stable 8. Status epilepticus: Due to medication noncompliance, insomnia and 4 beers nightly.             --typical seizure--> Room gets bigger, smells something burning and LUE starts having burning sensation.              --Has been having breakthrough seizures every couple of months per patient and mother. To continue Lamictal 250 mg BID and reinforce compliance.  Discussed compliance with medication --Valtoco 15 mg prn for seizure lasting > 2 minutes for use after discharge.  9.  Anaplastic oligodendroglioma s/p RSXN: Followed by Dr. Gypsy Decant appt tomorrow             --surgery by Dr. Maurice Small  10. H/o depression/GAD/psychosis: Reports male voice in head is very distant now. Liked Wellbutrin--nothing works.              --buspar prn resumed.  11. H/o Polysubstance abuse: Admits to ETOH. Meth/cocaine per chart review.  --he reports that he was started on Naltrexone by PCP for ETOH abuse but not taken for up to 4 weeks per mother.  12.  Aspiration pneumonia:             -Completed course of antibiotics 13.  Hypokalemia: Recheck labs tomorrow -stable      LOS: 1 days A FACE TO FACE EVALUATION WAS PERFORMED  Angelina Sheriff 11/08/2022, 9:28 AM

## 2022-11-08 NOTE — Evaluation (Signed)
Occupational Therapy Assessment and Plan  Patient Details  Name: Stephen Beard MRN: [id number] Date of Birth: [birth year]  OT Diagnosis: ataxia and muscle weakness (generalized) Rehab Potential: Rehab Potential (ACUTE ONLY): Excellent ELOS: 7 days   Today's Date: 11/08/2022 OT Individual Time: 1310-1421 OT Individual Time Calculation (min): 71 min     Hospital Problem: Principal Problem:   Nontraumatic subdural hemorrhage, unspecified (HCC) Active Problems:   Anaplastic oligodendroglioma of frontal lobe (HCC)   Past Medical History:  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HCC)    GAD (generalized anxiety disorder)    Headache    Intentional overdose (HCC) 12/2019   Psychotic disorder (HCC)    Schizoaffective disorder (HCC)    Seizures (HCC)    Substance abuse (HCC)    Suicidal ideation    Past Surgical History:  Past Surgical History:  Procedure Laterality Date   APPLICATION OF CRANIAL NAVIGATION N/A 07/02/2019   Procedure: APPLICATION OF CRANIAL NAVIGATION;  Surgeon: Jadene Pierini, MD;  Location: [facility] OR;  Service: Neurosurgery;  Laterality: N/A;   CRANIOTOMY N/A 07/03/2019   Procedure: CRANIOTOMY HEMATOMA EVACUATION SUBDURAL;  Surgeon: Jadene Pierini, MD;  Location: [facility] OR;  Service: Neurosurgery;  Laterality: N/A;   CRANIOTOMY Right 07/03/2019   Procedure: CRANIOTOMY HEMATOMA EVACUATION SUBDURAL;  Surgeon: Jadene Pierini, MD;  Location: [facility] OR;  Service: Neurosurgery;  Laterality: Right;   CRANIOTOMY Right 07/02/2019   Procedure: CRANIOTOMY TUMOR EXCISION with Normajean Glasgow;  Surgeon: Jadene Pierini, MD;  Location: [facility] OR;  Service: Neurosurgery;  Laterality: Right;  posterior    Assessment & Plan Clinical Impression: Stephen Beard is a 27 year old male with history of  Anxiety, depression, psychosis, bifrontal oligodendroglioma s/p RXSN, XRT and seizure disorder with reports  of breakthrough seizures every 2 months. He was admitted on 11/01/22 with seizure lasting >30 minutes and intubated for airway protection due to status epilepticus. He required propofol,  was loaded with Keppra and home Lamictal continued. Unasyn added due to leucocytosis and concerns of aspiration PNA and he required Levophed due to hypotension. LT-EEG showed cortical dysfunction in right parieto-occipital region likely due to structural  abnormality and no definite seizures. He tolerated extubation without difficulty tand noted to have mild dysmetria on left with apraxic gait per Dr. Otelia Limes.  Vimpat added but d/c due to reports of  non compliance with Lamictal, educate on sleep deprivation, ETOH/drug use as well as recommendations to discharge with Voltoco 15 mg prn for seizure lasting > 30 minutes.  Patient reports he continues to feel like his legs are weak and unsteady.  Therapy has been working with patient who is limited by visual deficits, decreased fine motor movement, STM deficits and lacks insight/awareness of deficits. CIR recommended due to functional decline.   Patient transferred to CIR on 11/07/2022 .    Patient currently requires min with basic self-care skills secondary to muscle weakness, decreased cardiorespiratoy endurance, hemianopsia, decreased awareness, decreased problem solving, and delayed processing, and decreased standing balance, decreased postural control, and decreased balance strategies.  Prior to hospitalization, patient could complete ADLs with independent .  Patient will benefit from skilled intervention to increase independence with basic self-care skills prior to discharge home with care partner.  Anticipate patient will require 24 hour supervision and follow up outpatient.  OT - End of Session Activity Tolerance: Tolerates 30+ min activity without fatigue Endurance Deficit: Yes Endurance Deficit  Description: generalized deconditioning OT Assessment Rehab Potential  (ACUTE ONLY): Excellent OT Patient demonstrates impairments in the following area(s): Balance;Perception;Safety;Cognition;Endurance;Motor;Vision OT Basic ADL's Functional Problem(s): Bathing;Dressing;Toileting OT Transfers Functional Problem(s): Toilet;Tub/Shower OT Additional Impairment(s): None OT Plan OT Intensity: Minimum of 1-2 x/day, 45 to 90 minutes OT Frequency: 5 out of 7 days OT Duration/Estimated Length of Stay: 7 days OT Treatment/Interventions: Balance/vestibular training;Discharge planning;Self Care/advanced ADL retraining;Therapeutic Activities;UE/LE Coordination activities;Cognitive remediation/compensation;Functional mobility training;Patient/family education;Skin care/wound managment;Therapeutic Exercise;Visual/perceptual remediation/compensation;Wheelchair propulsion/positioning;UE/LE Strength taining/ROM;Psychosocial support;DME/adaptive equipment instruction;Community reintegration OT Self Feeding Anticipated Outcome(s): no goal OT Basic Self-Care Anticipated Outcome(s): (S) OT Toileting Anticipated Outcome(s): (S) OT Bathroom Transfers Anticipated Outcome(s): (S) OT Recommendation Recommendations for Other Services: Neuropsych consult Patient destination: Home Follow Up Recommendations: Outpatient OT Equipment Recommended: To be determined   OT Evaluation Precautions/Restrictions  Precautions Precautions: Fall Precaution Comments: seizure Restrictions Weight Bearing Restrictions: No General Chart Reviewed: Yes Family/Caregiver Present: No   Home Living/Prior Functioning Home Living Family/patient expects to be discharged to:: Private residence Living Arrangements: Parent Available Help at Discharge: Family, Available 24 hours/day Type of Home: Apartment Home Access: Level entry Home Layout: One level Bathroom Shower/Tub: Associate Professor: Yes Additional Comments: lives with mom, step-dad and uncle with  Down Syndrome  Lives With: Family IADL History Homemaking Responsibilities: Yes Meal Prep Responsibility: Secondary Laundry Responsibility: Secondary Cleaning Responsibility: Secondary Bill Paying/Finance Responsibility: No Current License: No Leisure and Hobbies: cooking Prior Function Level of Independence: Independent with transfers, Independent with gait, Independent with basic ADLs  Able to Take Stairs?: Yes Vocation: Unemployed Vision Baseline Vision/History: 1 Wears glasses Ability to See in Adequate Light: 0 Adequate Patient Visual Report: No change from baseline Vision Assessment?: Yes Eye Alignment: Within Functional Limits Ocular Range of Motion: Within Functional Limits Alignment/Gaze Preference: Within Defined Limits Tracking/Visual Pursuits: Able to track stimulus in all quads without difficulty Saccades: Within functional limits Convergence: Within functional limits Additional Comments: L homonyous hemianopsia Perception  Perception: Within Functional Limits Praxis Praxis: WFL Cognition Cognition Overall Cognitive Status: Impaired/Different from baseline Arousal/Alertness: Awake/alert Orientation Level: Person;Place;Situation Person: Oriented Place: Oriented Situation: Oriented Memory: Appears intact Awareness: Impaired Awareness Impairment: Emergent impairment Safety/Judgment: Impaired Comments: Delayed processing, reduced safety awareness Brief Interview for Mental Status (BIMS) Repetition of Three Words (First Attempt): 3 Temporal Orientation: Year: Correct Temporal Orientation: Month: Accurate within 5 days Temporal Orientation: Day: Correct Recall: "Sock": Yes, no cue required Recall: "Blue": Yes, no cue required Recall: "Bed": Yes, no cue required BIMS Summary Score: 15 Sensation Sensation Light Touch: Appears Intact Coordination Gross Motor Movements are Fluid and Coordinated: No Fine Motor Movements are Fluid and Coordinated: No Motor   Motor Motor: Ataxia Motor - Skilled Clinical Observations: Mild ataxia  Trunk/Postural Assessment  Cervical Assessment Cervical Assessment: Within Functional Limits Thoracic Assessment Thoracic Assessment: Within Functional Limits Lumbar Assessment Lumbar Assessment: Within Functional Limits Postural Control Postural Control: Deficits on evaluation Righting Reactions: righting reactions delayed  Balance Balance Balance Assessed: Yes Static Sitting Balance Static Sitting - Balance Support: Feet supported Static Sitting - Level of Assistance: 7: Independent Dynamic Sitting Balance Dynamic Sitting - Balance Support: Feet supported Dynamic Sitting - Level of Assistance: 7: Independent Static Standing Balance Static Standing - Balance Support: During functional activity Static Standing - Level of Assistance: 5: Stand by assistance Dynamic Standing Balance Dynamic Standing - Balance Support: During functional activity Dynamic Standing - Level of Assistance: 4: Min assist Extremity/Trunk Assessment RUE Assessment RUE Assessment: Within Functional Limits LUE Assessment LUE Assessment: Within Functional  Limits  Care Tool Care Tool Self Care Eating   Eating Assist Level: Set up assist    Oral Care    Oral Care Assist Level: Supervision/Verbal cueing    Bathing   Body parts bathed by patient: Right arm;Left arm;Right lower leg;Chest;Left upper leg;Left lower leg;Abdomen;Front perineal area;Buttocks;Right upper leg;Face     Assist Level: Minimal Assistance - Patient > 75%    Upper Body Dressing(including orthotics)   What is the patient wearing?: Pull over shirt   Assist Level: Supervision/Verbal cueing    Lower Body Dressing (excluding footwear)   What is the patient wearing?: Underwear/pull up;Pants Assist for lower body dressing: Minimal Assistance - Patient > 75%    Putting on/Taking off footwear   What is the patient wearing?: Non-skid slipper socks Assist for  footwear: Supervision/Verbal cueing       Care Tool Toileting Toileting activity   Assist for toileting: Minimal Assistance - Patient > 75%     Care Tool Bed Mobility Roll left and right activity   Roll left and right assist level: Supervision/Verbal cueing    Sit to lying activity   Sit to lying assist level: Supervision/Verbal cueing    Lying to sitting on side of bed activity   Lying to sitting on side of bed assist level: the ability to move from lying on the back to sitting on the side of the bed with no back support.: Supervision/Verbal cueing     Care Tool Transfers Sit to stand transfer   Sit to stand assist level: Minimal Assistance - Patient > 75%    Chair/bed transfer   Chair/bed transfer assist level: Minimal Assistance - Patient > 75%     Toilet transfer   Assist Level: Minimal Assistance - Patient > 75%     Care Tool Cognition  Expression of Ideas and Wants Expression of Ideas and Wants: 4. Without difficulty (complex and basic) - expresses complex messages without difficulty and with speech that is clear and easy to understand  Understanding Verbal and Non-Verbal Content Understanding Verbal and Non-Verbal Content: 4. Understands (complex and basic) - clear comprehension without cues or repetitions   Memory/Recall Ability Memory/Recall Ability : Current season;Location of own room;Staff names and faces;That he or she is in a hospital/hospital unit   Refer to Care Plan for Long Term Goals  SHORT TERM GOAL WEEK 1 OT Short Term Goal 1 (Week 1): STG= LTG d/t ELOS  Recommendations for other services: Neuropsych   Skilled Therapeutic Intervention ADL ADL Eating: Independent Where Assessed-Eating: Edge of bed Grooming: Supervision/safety Where Assessed-Grooming: Standing at sink Upper Body Bathing: Supervision/safety Where Assessed-Upper Body Bathing: Shower Lower Body Bathing: Contact guard Where Assessed-Lower Body Bathing: Shower Upper Body Dressing:  Supervision/safety Where Assessed-Upper Body Dressing: Standing at sink Lower Body Dressing: Minimal assistance Where Assessed-Lower Body Dressing: Standing at sink Toileting: Minimal assistance Where Assessed-Toileting: Teacher, adult education: Curator Method: Ambulating Tub/Shower Transfer: Minimal Radiation protection practitioner Method: Ship broker: Insurance underwriter: Insurance underwriter Method: Designer, industrial/product: Shower seat with back Mobility  Bed Mobility Bed Mobility: Sit to Supine;Supine to Sit Supine to Sit: Supervision/Verbal cueing Sit to Supine: Supervision/Verbal cueing Transfers Sit to Stand: Minimal Assistance - Patient > 75% Stand to Sit: Minimal Assistance - Patient > 75%   Skilled OT evaluation completed with the creation of pt centered OT POC. Pt educated on condition, ELOS, rehab expectations, and fall risk reduction strategies throughout session. Pt  with reduced insight into awareness, L homonyous hemianopsia, reduced balance dynamically, and ataxia. Pt completed shower as described above. He then completed isolated single leg stance activities in the gym to address dynamic balance. He returned to his room and was left sitting up with all needs met, chair alarm set.    Discharge Criteria: Patient will be discharged from OT if patient refuses treatment 3 consecutive times without medical reason, if treatment goals not met, if there is a change in medical status, if patient makes no progress towards goals or if patient is discharged from hospital.  The above assessment, treatment plan, treatment alternatives and goals were discussed and mutually agreed upon: by patient  Crissie Reese 11/08/2022, 1:51 PM

## 2022-11-08 NOTE — Evaluation (Signed)
Physical Therapy Assessment and Plan  Patient Details  Name: Stephen Beard MRN: [id number] Date of Birth: [birth year]  PT Diagnosis: Ataxia, Difficulty walking, and Muscle weakness Rehab Potential: Good ELOS: 7 days   Today's Date: 11/08/2022 PT Individual Time: 0802-0915 PT Individual Time Calculation (min): 73 min    Hospital Problem: Principal Problem:   Nontraumatic subdural hemorrhage, unspecified (HCC) Active Problems:   Anaplastic oligodendroglioma of frontal lobe (HCC)   Past Medical History:  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HCC)    GAD (generalized anxiety disorder)    Headache    Intentional overdose (HCC) 12/2019   Psychotic disorder (HCC)    Schizoaffective disorder (HCC)    Seizures (HCC)    Substance abuse (HCC)    Suicidal ideation    Past Surgical History:  Past Surgical History:  Procedure Laterality Date   APPLICATION OF CRANIAL NAVIGATION N/A 07/02/2019   Procedure: APPLICATION OF CRANIAL NAVIGATION;  Surgeon: Jadene Pierini, MD;  Location: [facility] OR;  Service: Neurosurgery;  Laterality: N/A;   CRANIOTOMY N/A 07/03/2019   Procedure: CRANIOTOMY HEMATOMA EVACUATION SUBDURAL;  Surgeon: Jadene Pierini, MD;  Location: [facility] OR;  Service: Neurosurgery;  Laterality: N/A;   CRANIOTOMY Right 07/03/2019   Procedure: CRANIOTOMY HEMATOMA EVACUATION SUBDURAL;  Surgeon: Jadene Pierini, MD;  Location: [facility] OR;  Service: Neurosurgery;  Laterality: Right;   CRANIOTOMY Right 07/02/2019   Procedure: CRANIOTOMY TUMOR EXCISION with Normajean Glasgow;  Surgeon: Jadene Pierini, MD;  Location: [facility] OR;  Service: Neurosurgery;  Laterality: Right;  posterior    Assessment & Plan Clinical Impression: Patient is a 27 year old male with history of  Anxiety, depression, psychosis, bifrontal oligodendroglioma s/p RXSN, XRT and seizure disorder with reports of breakthrough seizures every 2 months. He  was admitted on 11/01/22 with seizure lasting >30 minutes and intubated for airway protection due to status epilepticus. He required propofol,  was loaded with Keppra and home Lamictal continued. Unasyn added due to leucocytosis and concerns of aspiration PNA and he required Levophed due to hypotension. LT-EEG showed cortical dysfunction in right parieto-occipital region likely due to structural  abnormality and no definite seizures. He tolerated extubation without difficulty tand noted to have mild dysmetria on left with apraxic gait per Dr. Otelia Limes.  Vimpat added but d/c due to reports of  non compliance with Lamictal, educate on sleep deprivation, ETOH/drug use as well as recommendations to discharge with Voltoco 15 mg prn for seizure lasting > 30 minutes.  Patient reports he continues to feel like his legs are weak and unsteady.  Therapy has been working with patient who is limited by visual deficits, decreased fine motor movement, STM deficits and lacks insight/awareness of deficits. Patient transferred to CIR on 11/07/2022 .   Patient currently requires min with mobility secondary to muscle weakness, decreased cardiorespiratoy endurance, decreased visual motor skills and hemianopsia, and decreased standing balance, decreased postural control, and decreased balance strategies.  Prior to hospitalization, patient was independent  with mobility and lived with Family in a Apartment home.  Home access is  Level entry.  Patient will benefit from skilled PT intervention to maximize safe functional mobility, minimize fall risk, and decrease caregiver burden for planned discharge home with 24 hour supervision.  Anticipate patient will benefit from follow up OP at discharge.  PT - End of Session Endurance Deficit: Yes Endurance Deficit Description: generalized deconditioning   PT Evaluation Precautions/Restrictions  Precautions Precautions: Fall Precaution Comments: seizure Restrictions Weight Bearing  Restrictions: No General Chart Reviewed: Yes Family/Caregiver Present: No  Pain Interference Pain Interference Pain Effect on Sleep: 1. Rarely or not at all Pain Interference with Therapy Activities: 1. Rarely or not at all Pain Interference with Day-to-Day Activities: 3. Frequently Home Living/Prior Functioning Home Living Available Help at Discharge: Family;Available 24 hours/day Type of Home: Apartment Home Access: Level entry Home Layout: One level Bathroom Shower/Tub: Armed forces operational officer Accessibility: Yes Additional Comments: lives with mom, step-dad and uncle with Down Syndrome  Lives With: Family Prior Function Level of Independence: Independent with transfers;Independent with gait;Independent with basic ADLs  Able to Take Stairs?: Yes Vocation: Unemployed Vision/Perception  Vision - History Ability to See in Adequate Light: 0 Adequate Vision - Assessment Eye Alignment: Within Functional Limits Ocular Range of Motion: Within Functional Limits Alignment/Gaze Preference: Within Defined Limits Tracking/Visual Pursuits: Able to track stimulus in all quads without difficulty Saccades: Within functional limits Convergence: Within functional limits Additional Comments: L homonyous hemianopsia Perception Perception: Within Functional Limits Praxis Praxis: WFL  Cognition Overall Cognitive Status: Impaired/Different from baseline Arousal/Alertness: Awake/alert Memory: Appears intact Awareness: Impaired Awareness Impairment: Emergent impairment Safety/Judgment: Impaired Comments: Delayed processing, reduced safety awareness Sensation Sensation Light Touch: Appears Intact Coordination Gross Motor Movements are Fluid and Coordinated: No Fine Motor Movements are Fluid and Coordinated: No Motor  Motor Motor: Ataxia Motor - Skilled Clinical Observations: Mild ataxia  Trunk/Postural Assessment  Cervical Assessment Cervical Assessment:  Within Functional Limits Thoracic Assessment Thoracic Assessment: Within Functional Limits Lumbar Assessment Lumbar Assessment: Within Functional Limits Postural Control Postural Control: Deficits on evaluation Righting Reactions: righting reactions delayed  Balance Balance Balance Assessed: Yes Static Sitting Balance Static Sitting - Balance Support: Feet supported Static Sitting - Level of Assistance: 7: Independent Dynamic Sitting Balance Dynamic Sitting - Balance Support: Feet supported Dynamic Sitting - Level of Assistance: 7: Independent Static Standing Balance Static Standing - Balance Support: During functional activity Static Standing - Level of Assistance: 5: Stand by assistance Dynamic Standing Balance Dynamic Standing - Balance Support: During functional activity Dynamic Standing - Level of Assistance: 4: Min assist Extremity Assessment  RUE Assessment RUE Assessment: Within Functional Limits LUE Assessment LUE Assessment: Within Functional Limits RLE Assessment RLE Assessment: Exceptions to [facility] Strength Comments: Grossly 4/5 LLE Assessment LLE Assessment: Exceptions to [facility] Strength Comments: Grossly 4/5  Care Tool Care Tool Bed Mobility Roll left and right activity   Roll left and right assist level: Supervision/Verbal cueing    Sit to lying activity   Sit to lying assist level: Supervision/Verbal cueing    Lying to sitting on side of bed activity   Lying to sitting on side of bed assist level: the ability to move from lying on the back to sitting on the side of the bed with no back support.: Supervision/Verbal cueing     Care Tool Transfers Sit to stand transfer   Sit to stand assist level: Contact Guard/Touching assist    Chair/bed transfer   Chair/bed transfer assist level: Contact Guard/Touching assist     Toilet transfer   Assist Level: Minimal Assistance - Patient > 75%    Car transfer   Car transfer assist level: Contact  Guard/Touching assist      Care Tool Locomotion Ambulation   Assist level: Minimal Assistance - Patient > 75%   Max distance: 1020'  Walk 10 feet activity   Assist level: Minimal Assistance - Patient > 75% Assistive  device: No Device   Walk 50 feet with 2 turns activity   Assist level: Minimal Assistance - Patient > 75% Assistive device: No Device  Walk 150 feet activity   Assist level: Minimal Assistance - Patient > 75% Assistive device: No Device  Walk 10 feet on uneven surfaces activity   Assist level: Minimal Assistance - Patient > 75%    Stairs   Assist level: Minimal Assistance - Patient > 75% Stairs assistive device: 2 hand rails Max number of stairs: 12  Walk up/down 1 step activity   Walk up/down 1 step (curb) assist level: Minimal Assistance - Patient > 75% Walk up/down 1 step or curb assistive device: 2 hand rails  Walk up/down 4 steps activity   Walk up/down 4 steps assist level: Minimal Assistance - Patient > 75% Walk up/down 4 steps assistive device: 2 hand rails  Walk up/down 12 steps activity   Walk up/down 12 steps assist level: Minimal Assistance - Patient > 75% Walk up/down 12 steps assistive device: 2 hand rails  Pick up small objects from floor   Pick up small object from the floor assist level: Contact Guard/Touching assist    Wheelchair Is the patient using a wheelchair?: Yes Type of Wheelchair: Manual   Wheelchair assist level: Dependent - Patient 0% Max wheelchair distance: 150'  Wheel 50 feet with 2 turns activity   Assist Level: Dependent - Patient 0%  Wheel 150 feet activity   Assist Level: Dependent - Patient 0%    Refer to Care Plan for Long Term Goals  SHORT TERM GOAL WEEK 1 PT Short Term Goal 1 (Week 1): STGs = LTGs  Recommendations for other services: None   Skilled Therapeutic Intervention  Evaluation completed (see details above and below) with education on PT POC and goals and individual treatment initiated with focus on bed  mobility, balance, transfers, and ambulation. Pt received supine in bed and agrees to therapy. No complaint of pain. Supine to sit with cues for sequencing and positioning. Pt performs sit to stand and stand step transfer to [facility] with CGA and cues for positioning. WC transport to gym for time management. Pt completes car transfer with CGA and cues for sequencing, then ramp navigation with minA and no AD, with cues for sequencing and stability during turns. Seated rest break. Pt completes 6 minute walk test without AD, requiring minA and cues to increase stride length and heel strike to improve balance and decrease risk for falls. Pt ambulates x1020' for test, verbalizing some fatigue in thighs following test. Pt then completes 12 6" steps with cues for step sequencing and safe positioning. Seated rest break. Pt then completes Functional Gait Assessment, as detailed above. WC transport back to room. Pt left seated in WC with alarm intact and all need within reach.   Mobility Bed Mobility Bed Mobility: Sit to Supine;Supine to Sit Supine to Sit: Supervision/Verbal cueing Sit to Supine: Supervision/Verbal cueing Transfers Transfers: Sit to Stand;Stand to Sit;Stand Pivot Transfers Sit to Stand: Minimal Assistance - Patient > 75% Stand to Sit: Minimal Assistance - Patient > 75% Stand Pivot Transfers: Minimal Assistance - Patient > 75% Stand Pivot Transfer Details: Verbal cues for precautions/safety Transfer (Assistive device): None Locomotion  Gait Ambulation: Yes Gait Assistance: Minimal Assistance - Patient > 75% Gait Distance (Feet): 1020 Feet Assistive device: None Gait Assistance Details: Verbal cues for gait pattern;Verbal cues for sequencing;Tactile cues for sequencing Gait Gait: Yes Gait Pattern: Impaired Gait Pattern: Decreased stride length;Ataxic Gait velocity: decreased  Stairs / Additional Locomotion Stairs: Yes Stairs Assistance: Minimal Assistance - Patient > 75% Stair Management  Technique: Two rails Number of Stairs: 12 Height of Stairs: 6 Ramp: Minimal Assistance - Patient >75% Curb: Minimal Assistance - Patient >75% Wheelchair Mobility Wheelchair Mobility: No   Discharge Criteria: Patient will be discharged from PT if patient refuses treatment 3 consecutive times without medical reason, if treatment goals not met, if there is a change in medical status, if patient makes no progress towards goals or if patient is discharged from hospital.  The above assessment, treatment plan, treatment alternatives and goals were discussed and mutually agreed upon: by patient  Beau Fanny, PT, DPT 11/08/2022, 3:56 PM

## 2022-11-08 NOTE — Progress Notes (Signed)
Inpatient Rehabilitation  Patient information reviewed and entered into eRehab system by Melissa M. Bowie, M.A., CCC/SLP, PPS Coordinator.  Information including medical coding, functional ability and quality indicators will be reviewed and updated through discharge.    

## 2022-11-08 NOTE — Plan of Care (Signed)
  Problem: RH Memory Goal: LTG Patient will use memory compensatory aids to (SLP) Description: LTG:  Patient will use memory compensatory aids to recall biographical/new, daily complex information with cues (SLP) Flowsheets (Taken 11/08/2022 1711) LTG: Patient will use memory compensatory aids to (SLP): Supervision Note: Utilize compensatory aids as needed to recall simple and complex information w/ 90% accuracy   Problem: RH Pre-functional/Other (Specify) Goal: RH LTG SLP (Specify) 1 Description: RH LTG SLP (Specify) 1 Flowsheets (Taken 11/08/2022 1711) LTG: Other SLP (Specify) 1: Pt will process complex information during structured tasks w/ supervision visual/verbal cues Goal: RH LTG SLP (Specify) 2 Description: RH LTG SLP (Specify) 2 Flowsheets (Taken 11/08/2022 1713) LTG: Other SLP (Specify) 2: Pt will implement compensatory strategies as needed during visual scanning/organization tasks w/ supervision verbal cues

## 2022-11-08 NOTE — Patient Care Conference (Signed)
Inpatient RehabilitationTeam Conference and Plan of Care Update Date: 11/08/2022   Time: 10:10 AM   Patient Name: Stephen Beard      Medical Record Number: [id number]  Date of Birth: [birth year] Sex: Male         Room/Bed: 4M08C/4M08C-01 Payor Info: Payor: MEDICARE / Plan: MEDICARE PART A AND B / Product Type: *No Product type* /    Admit Date/Time:  11/07/2022  1:50 PM  Primary Diagnosis:  Nontraumatic subdural hemorrhage, unspecified [facility])  Hospital Problems: Principal Problem:   Nontraumatic subdural hemorrhage, unspecified (HCC) Active Problems:   Anaplastic oligodendroglioma of frontal lobe [facility])    Expected Discharge Date: Expected Discharge Date: 11/15/22  Team Members Present: Physician leading conference: Dr. Elijah Birk Social Worker Present: Cecile Sheerer, LCSWA Nurse Present: Vedia Pereyra, RN PT Present: Malachi Pro, PT OT Present: Jake Shark, OT SLP Present: Feliberto Gottron, SLP PPS Coordinator present : Fae Pippin, SLP     Current Status/Progress Goal Weekly Team Focus  Bowel/Bladder   Pt is continent of b/b LBM: 08/26 per pt   remain continent of b/b   frequent toileting qshift and PRN    Swallow/Nutrition/ Hydration   admitted to CIR on regular textures/thin liquids - eval pending           ADL's   eval pending            Mobility               Communication   eval pending            Safety/Cognition/ Behavioral Observations  eval pending            Pain   pt denies pain   remain free of pain   pain assessment qshift and PRN    Skin   skin is currently intact   skin integrity remains intact  skin asessment qshift and PRN      Discharge Planning:  TBA. Per EMR, pt lives with his mother and stepfather. Mother to provide 24/7 care. SW will confirm there are no barriers to discharge.   Team Discussion: Nontraumatic SDH. Labs are good. Vital signs stable. Denies pain. Tolerating regular diet.  Patient on  target to meet rehab goals: Therapies pending with a discharge date of 11/15/22  *See Care Plan and progress notes for long and short-term goals.   Revisions to Treatment Plan:  Monitor for seizures. Monitor labs/VS  Teaching Needs: Medications, safety, self care, gait/transfer training, etc.   Current Barriers to Discharge: Decreased caregiver support  Possible Resolutions to Barriers: Family education Order recommended DME if needed.      Medical Summary Current Status: medically complicated by seizures, mood/behavior, Hx polysubstance abuce, and pain  Barriers to Discharge: Behavior/Mood;Medical stability;Self-care education;Uncontrolled Pain  Barriers to Discharge Comments: uncontrolled seizures, uncontrolled pain with Hx polysubstance abuse Possible Resolutions to [employer] Focus: titrate medications for pain control, provide education, moniutor for seizures   Continued Need for Acute Rehabilitation Level of Care: The patient requires daily medical management by a physician with specialized training in physical medicine and rehabilitation for the following reasons: Direction of a multidisciplinary physical rehabilitation program to maximize functional independence : Yes Medical management of patient stability for increased activity during participation in an intensive rehabilitation regime.: Yes Analysis of laboratory values and/or radiology reports with any subsequent need for medication adjustment and/or medical intervention. : Yes   I attest that I was present, lead the team conference, and concur with the assessment  and plan of the team.   Jearld Adjutant 11/08/2022, 12:56 PM

## 2022-11-08 NOTE — Evaluation (Signed)
Speech Language Pathology Assessment and Plan  Patient Details  Name: Stephen Beard MRN: [id number] Date of Birth: 03/08/1996  SLP Diagnosis: Cognitive Impairments  Rehab Potential: Good ELOS: 7-10 days   Today's Date: 11/08/2022 SLP Individual Time: 1100-1200 SLP Individual Time Calculation (min): 60 min   Hospital Problem: Principal Problem:   Nontraumatic subdural hemorrhage, unspecified (HCC) Active Problems:   Anaplastic oligodendroglioma of frontal lobe (HCC)  Past Medical History:  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HCC)    GAD (generalized anxiety disorder)    Headache    Intentional overdose (HCC) 12/2019   Psychotic disorder (HCC)    Schizoaffective disorder (HCC)    Seizures (HCC)    Substance abuse (HCC)    Suicidal ideation    Past Surgical History:  Past Surgical History:  Procedure Laterality Date   APPLICATION OF CRANIAL NAVIGATION N/A 07/02/2019   Procedure: APPLICATION OF CRANIAL NAVIGATION;  Surgeon: Jadene Pierini, MD;  Location: [facility];  Service: Neurosurgery;  Laterality: N/A;   CRANIOTOMY N/A 07/03/2019   Procedure: CRANIOTOMY HEMATOMA EVACUATION SUBDURAL;  Surgeon: Jadene Pierini, MD;  Location: [facility];  Service: Neurosurgery;  Laterality: N/A;   CRANIOTOMY Right 07/03/2019   Procedure: CRANIOTOMY HEMATOMA EVACUATION SUBDURAL;  Surgeon: Jadene Pierini, MD;  Location: [facility];  Service: Neurosurgery;  Laterality: Right;   CRANIOTOMY Right 07/02/2019   Procedure: CRANIOTOMY TUMOR EXCISION with Normajean Glasgow;  Surgeon: Jadene Pierini, MD;  Location: [facility];  Service: Neurosurgery;  Laterality: Right;  posterior    Assessment / Plan / Recommendation Clinical Impression  Patient is a 27 year old male with history of  Anxiety, depression, psychosis, bifrontal oligodendroglioma s/p RXSN, XRT and seizure disorder with reports of breakthrough seizures every 2 months.  He was admitted on 11/01/22 with seizure lasting >30 minutes and intubated for airway protection due to status epilepticus. He required propofol,  was loaded with Keppra and home Lamictal continued. Unasyn added due to leucocytosis and concerns of aspiration PNA and he required Levophed due to hypotension. LT-EEG showed cortical dysfunction in right parieto-occipital region likely due to structural  abnormality and no definite seizures. He tolerated extubation without difficulty tand noted to have mild dysmetria on left with apraxic gait per Dr. Otelia Limes.  Vimpat added but d/c due to reports of  non compliance with Lamictal, educate on sleep deprivation, ETOH/drug use as well as recommendations to discharge with Voltoco 15 mg prn for seizure lasting > 30 minutes.  Patient reports he continues to feel like his legs are weak and unsteady.  Therapy has been working with patient who is limited by visual deficits, decreased fine motor movement, STM deficits and lacks insight/awareness of deficits. Patient transferred to CIR on 11/07/2022 .   Pt greeted in his room. He was awake/alert in his wheelchair upon SLP arrival and very agreeable to eval tasks. Pt was a seemingly reliable historian and was able to recall recent medical hx and events for the day thus far. SLP then administered portions of  the COGNISTAT. He presented w/ mild deficits during recall and visual organization/construction task. Additionally, pt reported difficulties processing information throughout eval tasks and required intermittent repetition of task instructions. Pt reported cognitive deficits @ baseline given oligodendroglioma, though endorsed slight exacerbation of deficits w/ recent medical events. He would benefit from skilled ST services during his stay at CIR to target mild cognitive deficits, maximize pt independence,  and return to prev roles/responsibilities.   Pt also completed 3 oz water test w/ thin liquids via straw. No overt s/s of  airway invasion noted. Dysphagia intervention not warranted.      Skilled Therapeutic Interventions          Cognitive-linguistic evaluation and bedside swallow screen completed. See above for more detail.   SLP Assessment  Patient will need skilled Speech Lanaguage Pathology Services during CIR admission    Recommendations  SLP Diet Recommendations: Age appropriate regular solids;Thin Liquid Administration via: Straw;Cup Medication Administration: Whole meds with liquid Supervision: Patient able to self feed Compensations: Minimize environmental distractions;Slow rate;Small sips/bites Postural Changes and/or Swallow Maneuvers: Seated upright 90 degrees;Upright 30-60 min after meal Oral Care Recommendations: Oral care BID Recommendations for Other Services: Neuropsych consult;Therapeutic Recreation consult Therapeutic Recreation Interventions: Pet therapy Patient destination: Home Follow up Recommendations: Outpatient SLP Equipment Recommended: None recommended by SLP    SLP Frequency 1 to 3 out of 7 days   SLP Duration  SLP Intensity  SLP Treatment/Interventions 7-10 days  Minumum of 1-2 x/day, 30 to 90 minutes  Cognitive remediation/compensation;Internal/external aids;Cueing hierarchy;Environmental controls;Therapeutic Activities;Functional tasks;Patient/family education    Pain  No pain reported  Prior Functioning Type of Home: Apartment  Lives With: Family Available Help at Discharge: Family;Available 24 hours/day Vocation: Unemployed  SLP Evaluation Cognition Overall Cognitive Status: History of cognitive impairments - at baseline Arousal/Alertness: Awake/alert Orientation Level: Oriented X4 Year: 2024 Month: August Day of Week: Correct Attention: Sustained;Selective;Focused Focused Attention: Appears intact Sustained Attention: Appears intact Selective Attention: Appears intact Memory: Impaired Memory Impairment: Storage deficit;Retrieval deficit;Decreased  recall of new information Awareness: Impaired Awareness Impairment: Emergent impairment Problem Solving: Impaired Problem Solving Impairment: Functional complex Executive Function: Organizing;Self Monitoring;Self Correcting Organizing: Impaired Organizing Impairment: Verbal complex;Functional complex Self Monitoring: Impaired Self Monitoring Impairment: Verbal complex;Functional basic Self Correcting: Impaired Self Correcting Impairment: Verbal complex;Functional basic Safety/Judgment: Impaired Comments: reduced safety awareness [employer] Scales of Cognitive Functioning: Automatic, Appropriate: Minimal Assistance for Daily Living Skills  Comprehension Auditory Comprehension Overall Auditory Comprehension: Appears within functional limits for tasks assessed Yes/No Questions: Within Functional Limits Commands: Within Functional Limits Conversation: Complex Interfering Components: Attention;Processing speed EffectiveTechniques: Extra processing time;Repetition;Stressing words Visual Recognition/Discrimination Discrimination: Not tested Reading Comprehension Reading Status: Not tested Expression Expression Primary Mode of Expression: Verbal Verbal Expression Overall Verbal Expression: Appears within functional limits for tasks assessed Initiation: No impairment Repetition: No impairment Naming: No impairment Written Expression Dominant Hand: Right Written Expression: Not tested Oral Motor Oral Motor/Sensory Function Overall Oral Motor/Sensory Function: Within functional limits Motor Speech Overall Motor Speech: Appears within functional limits for tasks assessed  Care Tool Care Tool Cognition Ability to hear (with hearing aid or hearing appliances if normally used Ability to hear (with hearing aid or hearing appliances if normally used): 0. Adequate - no difficulty in normal conservation, social interaction, listening to TV   Expression of Ideas and Wants  Expression of Ideas and Wants: 4. Without difficulty (complex and basic) - expresses complex messages without difficulty and with speech that is clear and easy to understand   Understanding Verbal and Non-Verbal Content Understanding Verbal and Non-Verbal Content: 4. Understands (complex and basic) - clear comprehension without cues or repetitions  Memory/Recall Ability Memory/Recall Ability : Current season;Location of own room;Staff names and faces;That he or she is in a hospital/hospital unit   Motor Speech Assessment  WFL   Bedside Swallowing Assessment General Date of Onset: 10/31/22 Previous Swallow Assessment: 8/25 recommending regular/thin Temperature Spikes  Noted: No Respiratory Status: Room air History of Recent Intubation: Yes Total duration of intubation (days): 4 days Date extubated: 11/04/22 Behavior/Cognition: Alert;Cooperative;Pleasant mood Oral Cavity - Dentition: Adequate natural dentition Self-Feeding Abilities: Able to feed self Vision: Functional for self-feeding Patient Positioning: Upright in chair/Tumbleform Baseline Vocal Quality: Normal Volitional Cough: Strong Volitional Swallow: Able to elicit    Short Term Goals: Week 1: SLP Short Term Goal 1 (Week 1): STGs = LTGs 2* ELOS  Refer to Care Plan for Long Term Goals  Recommendations for other services: Neuropsych and Therapeutic Recreation  Pet therapy  Discharge Criteria: Patient will be discharged from SLP if patient refuses treatment 3 consecutive times without medical reason, if treatment goals not met, if there is a change in medical status, if patient makes no progress towards goals or if patient is discharged from hospital.  The above assessment, treatment plan, treatment alternatives and goals were discussed and mutually agreed upon: by patient  [name] 11/08/2022, 4:49 PM

## 2022-11-08 NOTE — Progress Notes (Signed)
Inpatient Rehabilitation Care Coordinator Assessment and Plan Patient Details  Name: Stephen Beard MRN: [id number] Date of Birth: [birth year]  Today's Date: 11/08/2022  Hospital Problems: Principal Problem:   Nontraumatic subdural hemorrhage, unspecified (HCC) Active Problems:   Anaplastic oligodendroglioma of frontal lobe [facility])  Past Medical History:  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HCC)    GAD (generalized anxiety disorder)    Headache    Intentional overdose (HCC) 12/2019   Psychotic disorder (HCC)    Schizoaffective disorder (HCC)    Seizures (HCC)    Substance abuse (HCC)    Suicidal ideation    Past Surgical History:  Past Surgical History:  Procedure Laterality Date   APPLICATION OF CRANIAL NAVIGATION N/A 07/02/2019   Procedure: APPLICATION OF CRANIAL NAVIGATION;  Surgeon: Jadene Pierini, MD;  [facility];  Service: Neurosurgery;  Laterality: N/A;   CRANIOTOMY N/A 07/03/2019   Procedure: CRANIOTOMY HEMATOMA EVACUATION SUBDURAL;  Surgeon: Jadene Pierini, MD;  [facility]: [facility];  Service: Neurosurgery;  Laterality: N/A;   CRANIOTOMY Right 07/03/2019   Procedure: CRANIOTOMY HEMATOMA EVACUATION SUBDURAL;  Surgeon: Jadene Pierini, MD;  [facility]: [facility];  Service: Neurosurgery;  Laterality: Right;   CRANIOTOMY Right 07/02/2019   Procedure: CRANIOTOMY TUMOR EXCISION with Normajean Glasgow;  Surgeon: Jadene Pierini, MD;  Location: [facility];  Service: Neurosurgery;  Laterality: Right;  posterior   Social History:  reports that he has been smoking. He has a 12 pack-year smoking history. His smokeless tobacco use includes chew. He reports current alcohol use. He reports current drug use. Drug: Cocaine.  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 Education: some Charity fundraiser - How often do you need to have someone help you when you read instructions, pamphlets, or other written material from your doctor or pharmacy?: Never Writes: Yes Employment Status: Disabled Date Retired/Disabled/Unemployed: 2023 Legal History/Current Legal Issues: Denies Guardian/Conservator: N/A   Abuse/Neglect Abuse/Neglect Assessment Can Be Completed: Yes Physical Abuse: Denies Verbal Abuse: Denies Sexual Abuse: Denies Exploitation of patient/patient's resources: Denies Self-Neglect: Denies  Patient response to: Social Isolation - How often do you feel lonely or isolated from those around you?: Never  Emotional Status Pt's affect, behavior and adjustment status: Pt in good spirits at time of visit. Recent Psychosocial Issues: Denies Psychiatric History: pt reports a hx of depression when he initially was staying home more because of his disablity. Pt states he is better now. Substance Abuse History: Pt admits to continugin to smoke cigarettes 1/2 ppd; etoh- sometimes; and denies rec drug use.  Patient / Family Perceptions, Expectations & Goals Pt/Family understanding of illness & functional limitations: Pt has a general understanding of pt care needs Premorbid pt/family roles/activities: Independent Anticipated changes in roles/activities/participation: Assistance with ADLs/IADLs Pt/family expectations/goals: pt goal is to owkr on physical abilities and cognitive strategies to help improve his memory.  Community Resources [employer]: None Premorbid Home Care/DME Agencies: None Transportation  available at discharge: TBD Is the patient able to respond to transportation needs?: Yes In the past 12 months,  has lack of transportation kept you from medical appointments or from getting medications?: No In the past 12 months, has lack of transportation kept you from meetings, work, or from getting things needed for daily living?: No Resource referrals recommended: Neuropsychology  Discharge Planning Living Arrangements: Parent, Other relatives Support Systems: Parent, Other relatives Type of Residence: Private residence Insurance Resources: [employer] Financial Resources: [employer] Financial Screen Referred: No Living Expenses: Lives with family Money Management: Family Does the patient have any problems obtaining your medications?: No Home Management: Pt reports all family members help manage home care needs Patient/Family Preliminary Plans: No changes Care Coordinator Barriers to Discharge: Other (comments) Care Coordinator Barriers to Discharge Comments: transportation to help enhance independence Care Coordinator Anticipated Follow Up Needs: HH/OP Expected length of stay: 7 days; d/c 9/3  Clinical Impression SW met with pt in room to introduce self, explain role, and discuss discharge process. Pt is not a Cytogeneticist. No HCPOA. No DME. SW will explore [facility] [place] services as he states he did not live within city limits. SW will follow-up.   Gretchen Short 11/08/2022, 3:48 PM

## 2022-11-09 DIAGNOSIS — I6200 Nontraumatic subdural hemorrhage, unspecified: Secondary | ICD-10-CM | POA: Diagnosis not present

## 2022-11-09 NOTE — Progress Notes (Signed)
Occupational Therapy Session Note  Patient Details  Name: Stephen Beard MRN: [id number] Date of Birth: Oct 06, 1995  Today's Date: 11/09/2022 OT Individual Time: 3329-5188 OT Individual Time Calculation (min): 45 min    Short Term Goals: Week 1:  OT Short Term Goal 1 (Week 1): STG= LTG d/t ELOS  Skilled Therapeutic Interventions/Progress Updates:    Pt received supine with no c/o pain, agreeable to OT session.  He declined shower, may ask PT for assist later. He completed bed mobility with mod I. Functional mobility around room with (S). Oral care in standing with (S). He completed 100 ft of functional mobility with CGA- (S) to the tub room. He completed a tub transfer in standing with CGA, requiring min cueing for positioning to reduce fall risk. Remainder of session focused on vision focused exercises to promote functional compensation for L homonyous hemianopia. The BITS was used to address visual tracking, bell cancellation task, smooth pursuits and head turning. He was intentionally positioned on the R side of the screen to promote need to head turn L. Provided education during/following re functional application of strategies. He ended with 300+ ft of functional mobility at [facility]- (S) level, working on functional scanning L to avoid obstacles (min cueing) and increasing pace to challenge cardiorespiratory endurance. Pt returned to his room and was left sitting up with all needs met, alarm set.   Therapy Documentation Precautions:  Precautions Precautions: Fall Precaution Comments: seizure Restrictions Weight Bearing Restrictions: No   Therapy/Group: Individual Therapy  Crissie Reese 11/09/2022, 7:39 AM

## 2022-11-09 NOTE — Progress Notes (Signed)
PROGRESS NOTE   Subjective/Complaints:  No events overnight Vitals stable No complaints.  States he is surprised how much better he is doing in such a short amount of time.  He is encouraged by his work with therapies, grateful to staff.  ROS: Denies fevers, chills, N/V, abdominal pain, constipation, diarrhea, SOB, cough, chest pain, new weakness or paraesthesias.  Per HPI above.  Objective:   No results found. Recent Labs    11/08/22 0803  WBC 5.3  HGB 16.3  HCT 47.0  PLT 279   Recent Labs    11/08/22 0803  NA 135  K 4.2  CL 103  CO2 24  GLUCOSE 92  BUN 17  CREATININE 1.24  CALCIUM 9.3    Intake/Output Summary (Last 24 hours) at 11/09/2022 0805 Last data filed at 11/08/2022 1815 Gross per 24 hour  Intake 358 ml  Output --  Net 358 ml        Physical Exam: Vital Signs Blood pressure 108/71, pulse 96, temperature 97.8 F (36.6 C), temperature source Oral, resp. rate 18, height 5\' 7"  (1.702 m), weight 63 kg, SpO2 100%.   PE: Constitution: Appropriate appearance for age. No apparent distress .  Working with therapies Resp: No respiratory distress. No accessory muscle usage. on RA and CTAB Cardio: Well perfused appearance. No m/r/g. RRR. No peripheral edema. Abdomen: Nondistended. Nontender.  +BS Psych: Appropriate mood and affect. Neuro: AAOx4. No apparent cognitive deficits  + Bilateral nystagmus with EOMI -asymptomatic  Neurologic Exam:   Sensory exam: revealed normal sensation in all dermatomal regions in bilateral upper extremities and bilateral lower extremities Motor exam: strength 5/5 throughout bilateral upper extremities and bilateral lower extremities Coordination: Fine motor coordination was normal on gross exam.      Assessment/Plan: 1. Functional deficits which require 3+ hours per day of interdisciplinary therapy in a comprehensive inpatient rehab setting. Physiatrist is providing  close team supervision and 24 hour management of active medical problems listed below. Physiatrist and rehab team continue to assess barriers to discharge/monitor patient progress toward functional and medical goals  Care Tool:  Bathing    Body parts bathed by patient: Right arm, Left arm, Right lower leg, Chest, Left upper leg, Left lower leg, Abdomen, Front perineal area, Buttocks, Right upper leg, Face         Bathing assist Assist Level: Minimal Assistance - Patient > 75%     Upper Body Dressing/Undressing Upper body dressing   What is the patient wearing?: Pull over shirt    Upper body assist Assist Level: Supervision/Verbal cueing    Lower Body Dressing/Undressing Lower body dressing      What is the patient wearing?: Underwear/pull up, Pants     Lower body assist Assist for lower body dressing: Minimal Assistance - Patient > 75%     Toileting Toileting    Toileting assist Assist for toileting: Minimal Assistance - Patient > 75%     Transfers Chair/bed transfer  Transfers assist     Chair/bed transfer assist level: Contact Guard/Touching assist     Locomotion Ambulation   Ambulation assist      Assist level: Minimal Assistance - Patient > 75%  Max distance: 1020'   Walk 10 feet activity   Assist     Assist level: Minimal Assistance - Patient > 75% Assistive device: No Device   Walk 50 feet activity   Assist    Assist level: Minimal Assistance - Patient > 75% Assistive device: No Device    Walk 150 feet activity   Assist    Assist level: Minimal Assistance - Patient > 75% Assistive device: No Device    Walk 10 feet on uneven surface  activity   Assist     Assist level: Minimal Assistance - Patient > 75%     Wheelchair     Assist Is the patient using a wheelchair?: Yes Type of Wheelchair: Manual    Wheelchair assist level: Dependent - Patient 0% Max wheelchair distance: 150'    Wheelchair 50 feet with 2  turns activity    Assist        Assist Level: Dependent - Patient 0%   Wheelchair 150 feet activity     Assist      Assist Level: Dependent - Patient 0%   Blood pressure 108/71, pulse 96, temperature 97.8 F (36.6 C), temperature source Oral, resp. rate 18, height 5\' 7"  (1.702 m), weight 63 kg, SpO2 100%.  Medical Problem List and Plan: 1. 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  Antithrombotics: -DVT/anticoagulation:  Pharmaceutical: Lovenox             -antiplatelet therapy: N/A 3. Pain Management:  Lidocaine patch added for mid back pain/discomfort.  4. Mood/Behavior/Sleep: Melatonin 5 mg prn (used a couple pills prn). Trazodone worked but does not like hangover effect-->"does not like strong medication"             -antipsychotic agents: N/A--quit meds. Has been to "mental hospital" X 2  -8/27: No sleep difficulties, behavior appropriate.  Monitor. 5. Neuropsych/cognition: This patient may is capable of making decisions on his own behalf. 6. Skin/Wound Care: Routine pressure relief measures.  7. Fluids/Electrolytes/Nutrition: Monitor I/O. Check CMET in am -admission labs stable -Doing well with p.o. intakes, stable, monitor weekly labs 8. Status epilepticus: Due to medication noncompliance, insomnia and 4 beers nightly.             --typical seizure--> Room gets bigger, smells something burning and LUE starts having burning sensation.              --Has been having breakthrough seizures every couple of months per patient and mother. To continue Lamictal 250 mg BID and reinforce compliance.  Discussed compliance with  medication --Valtoco 15 mg prn for seizure lasting > 2 minutes for use after discharge.  -No apparent seizures since admission to inpatient rehab 9.  Anaplastic oligodendroglioma s/p RSXN: Followed by Dr. Gypsy Decant appt tomorrow             --surgery by Dr. Maurice Small  10. H/o depression/GAD/psychosis: Reports male voice in head is very distant now. Liked Wellbutrin--nothing works.              --buspar prn resumed.  11. H/o Polysubstance abuse: Admits to ETOH. Meth/cocaine per chart review.  --he reports that he was started on Naltrexone by PCP for ETOH abuse but not taken for up to 4 weeks per mother.  12.  Aspiration pneumonia:             -Completed course of antibiotics 13.  Hypokalemia: Recheck labs tomorrow -stable      LOS: 2 days A FACE TO FACE EVALUATION WAS PERFORMED  Stephen Beard 11/09/2022, 8:05 AM

## 2022-11-09 NOTE — Progress Notes (Signed)
Recreational Therapy Session Note  Patient Details  Name: Stephen Beard MRN: [id number] Date of Birth: 1995-12-18 Today's Date: 11/09/2022  No c/o pain. Pt participated in animal assisted activity standing/stooping with supervision.     Tashonda Pinkus 11/09/2022, 12:20 PM

## 2022-11-09 NOTE — Progress Notes (Signed)
Patient ID: Stephen Beard, male   DOB: [birth year], 27 y.o.   MRN: [id number]  SW followed up with pt mother Archie Patten to introduce self, explain role, discuss discharge process, inform on d/c 9/3, and discuss fam edu. Fam edu schedule for Fri 8am-11am. SW will also explore other transportation options for pt to increase independence if services are available. Pt switched from Medicaid to Medicare due to care needs. Limited transportation options. She will transport to outpatient if needed; pt previously at [facility].   Cecile Sheerer, MSW, LCSWA Office: [phone] Cell: [phone] Fax: [phone]

## 2022-11-09 NOTE — Care Management (Signed)
[facility] Individual Statement of Services  Patient Name:  Stephen Beard  Date:  11/09/2022  Welcome to the [facility].  Our goal is to provide you with an individualized program based on your diagnosis and situation, designed to meet your specific needs.  With this comprehensive rehabilitation program, you will be expected to participate in at least 3 hours of rehabilitation therapies Monday-Friday, with modified therapy programming on the weekends.  Your rehabilitation program will include the following services:  Physical Therapy (PT), Occupational Therapy (OT), Speech Therapy (ST), 24 hour per day rehabilitation nursing, Therapeutic Recreaction (TR), Psychology, Neuropsychology, Care Coordinator, Rehabilitation Medicine, Nutrition Services, Pharmacy Services, and Other  Weekly team conferences will be held on Tuesdays to discuss your progress.  Your Inpatient Rehabilitation Care Coordinator will talk with you frequently to get your input and to update you on team discussions.  Team conferences with you and your family in attendance may also be held.  Expected length of stay: 7 days    Overall anticipated outcome: Supervision  Depending on your progress and recovery, your program may change. Your Inpatient Rehabilitation Care Coordinator will coordinate services and will keep you informed of any changes. Your Inpatient Rehabilitation Care Coordinator's name and contact numbers are listed  below.  The following services may also be recommended but are not provided by the [facility]:  Driving Evaluations Home Health Rehabiltiation Services Outpatient Rehabilitation Services Vocational Rehabilitation   Arrangements will be made to provide these services after discharge if needed.  Arrangements include referral to agencies that provide these services.  Your insurance has been verified to be:  Medicare A/B  Your primary  doctor is:  [facility] [employer]  Pertinent information will be shared with your doctor and your insurance company.  Inpatient Rehabilitation Care Coordinator:  Susie Cassette [phone] or [phone]  Information discussed with and copy given to patient by: Gretchen Short, 11/09/2022, 3:06 PM

## 2022-11-09 NOTE — Progress Notes (Signed)
Physical Therapy Session Note  Patient Details  Name: Stephen Beard MRN: [id number] Date of Birth: 1995/11/20  Today's Date: 11/09/2022 PT Individual Time: 1020-1100 and 1420-1530 PT Individual Time Calculation (min): 40 min and 70 min  Short Term Goals: Week 1:  PT Short Term Goal 1 (Week 1): STGs = LTGs  Skilled Therapeutic Interventions/Progress Updates:     1st Session: Pt received seated in [facility] and agrees to therapy. No complaint of pain. Pt requesting to take shower prior to therapy Pt performs sit to stand with cues for initiation. Pt ambulates to shower with CGA and cues for positioning and safe sequencing secondary to pt's Lt visual field deficits. PT provides cues to sit on shower chair for safety. Pt takes shower with verbal cues for safety but otherwise no assistance required. Pt performs lower body dressing with minA for stability and cues for safety. Following, pt ambulates to gym with CGA and cues for navigation. Pt then completes Gait training on treadmill with litegait support for bodyweight supported high level ambulation.   Pt ambulates for 5:00 at 2.4 mph for 1001'. No UE support. PT cues for upright gaze to improve balance, as well as incresaing stride length. Pt ambulates for 3:00 at 2.4 mphfor 626' with 4lb ankle weights. Utilizes BUE support on grab bars.   Pt ambulates back to room. Left seated in WC with all needs within reach.   2nd Session: Pt received seated in [facility] and agrees to therapy. No complaint of pain. Pt performs sit to stand with cues for initiation. Pt ambulates x300' with occasional CGA/minA to avoid obstacle on Lt due to pt's Lt sided visual deficits. Pt completes Nustep for endurance training and reciprocal coordination. Pt completes x4:00 at workload of 7 with average steps per minute ~55, and x6:00 at workload of 4 with same step rate. PT provides cues for hand and foot placement and completing full available ROM. Seated rest break. PT places green  theraband around distal thighs to engage hip abductors. Pt completes x10 reps of sit to stand with fee and knees slightly wider than shoulder width apart. Pt then completes static hold in squat position. PT provides cues for body mechanics and positioning for optimal benefit of exercise, and pt completes for 1:00, 45 seconds, 35 seconds, 30 seconds, and 25 seconds, fatiguing more quickly with each set. Following rest break, pt completes side steps with green theraband around thighs, with cues to maintain feet no closer than shoulder width apart to maintain tension and glute engagement throughout activity. Pt completes 2x20' to the Lt and 2x20' to the Rt. Following rest break, pt ambulates back to room. Left seated in WC with alarm intact and all needs within reach.    Therapy Documentation Precautions:  Precautions Precautions: Fall Precaution Comments: seizure Restrictions Weight Bearing Restrictions: No    Therapy/Group: Individual Therapy  Beau Fanny, PT, DPT 11/09/2022, 3:59 PM

## 2022-11-09 NOTE — Progress Notes (Signed)
Speech Language Pathology Daily Session Note  Patient Details  Name: Stephen Beard MRN: [id number] Date of Birth: 10-30-1995  Today's Date: 11/09/2022 SLP Individual Time: 0905-1000 SLP Individual Time Calculation (min): 55 min  Short Term Goals: Week 1: SLP Short Term Goal 1 (Week 1): STGs = LTGs 2* ELOS  Skilled Therapeutic Interventions: Skilled treatment session focused on cognitive goals. Upon arrival, patient was awake while upright in the wheelchair. Patient reports ongoing difficulty with working and short-term memory, information processing, as well as left visual scanning.  SLP facilitated session by providing a visual anchor to assist with oral reading in regards to left visual scanning and decreasing visual distractions on the page. The anchor also allowed patient to quickly look back at the information to assist with recall and retention of information. With assist from the visual anchor, patient able to read and decode at the sentence level with overall supervision level verbal cues. Supervision level verbal cues were also needed for problem solving while transferring information to a calendar. SLP provided education regarding implementation of external aids at home to assist with recall of information (ex. putting appointments along with who will be driving him to the appointment on the calendar, etc.). Patient verbalized understanding and agreement. Patient left upright in wheelchair with alarm on and all needs within reach. Continue with current plan of care.      Pain No/Denies Pain   Therapy/Group: Individual Therapy  Aubrina Nieman 11/09/2022, 12:58 PM

## 2022-11-10 LAB — BASIC METABOLIC PANEL
Anion gap: 9 (ref 5–15)
BUN: 14 mg/dL (ref 6–20)
CO2: 28 mmol/L (ref 22–32)
Calcium: 9.6 mg/dL (ref 8.9–10.3)
Chloride: 100 mmol/L (ref 98–111)
Creatinine, Ser: 0.81 mg/dL (ref 0.61–1.24)
GFR, Estimated: >60 mL/min (ref >60)
Glucose, Bld: 104 mg/dL — ABNORMAL HIGH (ref 70–99)
Potassium: 4.4 mmol/L (ref 3.5–5.1)
Sodium: 137 mmol/L (ref 135–145)

## 2022-11-10 LAB — CBC
HCT: 44.6 % (ref 39.0–52.0)
Hemoglobin: 15.4 g/dL (ref 13.0–17.0)
MCH: 33.3 pg (ref 26.0–34.0)
MCHC: 34.5 g/dL (ref 30.0–36.0)
MCV: 96.3 fL (ref 80.0–100.0)
Platelets: 346 10*3/uL (ref 150–400)
RBC: 4.63 MIL/uL (ref 4.22–5.81)
RDW: 11.7 % (ref 11.5–15.5)
WBC: 6 10*3/uL (ref 4.0–10.5)
nRBC: 0 % (ref 0.0–0.2)

## 2022-11-10 MED ORDER — SENNOSIDES-DOCUSATE SODIUM 8.6-50 MG PO TABS
1.0000 | ORAL_TABLET | Freq: Two times a day (BID) | ORAL | Status: DC
  Filled 2022-11-10: qty 1

## 2022-11-10 NOTE — Progress Notes (Addendum)
Speech Language Pathology Daily Session Note  Patient Details  Name: YOHANCE NORLANDER MRN: [id number] Date of Birth: 1995/09/22  Today's Date: 11/10/2022 SLP Individual Time: 1000-1100 SLP Individual Time Calculation (min): 60 min  Short Term Goals: Week 1: SLP Short Term Goal 1 (Week 1): STGs = LTGs 2* ELOS  Skilled Therapeutic Interventions:   Pt greeted at bedside. He was awake/alert in his wheelchair upon SLP arrival. He was very pleasant/agreeable to tx tasks targeting cognition. He was able to recall current date and some tasks completed in prev ST tx session. He benefited from minA verbal cues to provide more detail re memory strategies/tasks. SLP facilitated written money management and pt required minA visual/verbal cues to assist w/ information processing and organization. Adequate calculation noted. He also completed complex written directions task and SLP provided demo of compensatory strategies such as highlighting important information (underline, circle, highlighting, etc) and marking off items already completed. For the remainder of the task, he was able to implement the strategies w/ modA visual/verbal cues d/t complexity of the task. He then completed a time management INDly w/ the use of strategies introduced during previous task. SLP encouraged him to write this in his memory notebook and provided some additional education re organization options for the notebook. He was left in his chair with the alarm set. Recommend cont ST per POC.   Pain  No pain reported  Therapy/Group: Individual Therapy  [name] 11/10/2022, 12:49 PM

## 2022-11-10 NOTE — Progress Notes (Signed)
PROGRESS NOTE   Subjective/Complaints:  No events overnight Vitals stable LBM 8/26 per chart; patient states he has not been recording all of them, last this a.m.  Exam, he is walking around the room at a supervision level with OT.  States he is doing well.  Is somewhat concerned about his vision, has been increasingly farsighted with poor glasses prescription prior to his hospitalization.  Now, with new homonymous hemianopsia.  Discussed that with visual field cut, patient would not be able to return to driving after discharge.  He states he plans to not drive for the rest of his life.   ROS: Denies fevers, chills, N/V, abdominal pain, constipation, diarrhea, SOB, cough, chest pain, new weakness or paraesthesias.  Per HPI above.  Objective:   No results found. Recent Labs    11/08/22 0803 11/10/22 0610  WBC 5.3 6.0  HGB 16.3 15.4  HCT 47.0 44.6  PLT 279 346   Recent Labs    11/08/22 0803 11/10/22 0610  NA 135 137  K 4.2 4.4  CL 103 100  CO2 24 28  GLUCOSE 92 104*  BUN 17 14  CREATININE 1.24 0.81  CALCIUM 9.3 9.6    Intake/Output Summary (Last 24 hours) at 11/10/2022 1007 Last data filed at 11/09/2022 1700 Gross per 24 hour  Intake 20 ml  Output --  Net 20 ml        Physical Exam: Vital Signs Blood pressure 104/66, pulse (!) 52, temperature 97.8 F (36.6 C), resp. rate 18, height 5\' 7"  (1.702 m), weight 63 kg, SpO2 100%.   PE: Constitution: Appropriate appearance for age. No apparent distress .  Walking under supervision in room. Resp: No respiratory distress. No accessory muscle usage. on RA and CTAB Cardio: Well perfused appearance. No m/r/g. RRR. No peripheral edema. Abdomen: Nondistended. Nontender.  +BS Psych: Appropriate mood and affect. Neuro: AAOx4. No apparent cognitive deficits  + Bilateral nystagmus with EOMI -asymptomatic + Left homonymous hemianopsia, affecting both eyes  Neurologic  Exam:   Sensory exam: revealed normal sensation in all dermatomal regions in bilateral upper extremities and bilateral lower extremities Motor exam: strength 5/5 throughout bilateral upper extremities and bilateral lower extremities Coordination: Fine motor coordination was normal on gross exam.      Assessment/Plan: 1. Functional deficits which require 3+ hours per day of interdisciplinary therapy in a comprehensive inpatient rehab setting. Physiatrist is providing close team supervision and 24 hour management of active medical problems listed below. Physiatrist and rehab team continue to assess barriers to discharge/monitor patient progress toward functional and medical goals  Care Tool:  Bathing    Body parts bathed by patient: Right arm, Left arm, Right lower leg, Chest, Left upper leg, Left lower leg, Abdomen, Front perineal area, Buttocks, Right upper leg, Face         Bathing assist Assist Level: Minimal Assistance - Patient > 75%     Upper Body Dressing/Undressing Upper body dressing   What is the patient wearing?: Pull over shirt    Upper body assist Assist Level: Supervision/Verbal cueing    Lower Body Dressing/Undressing Lower body dressing      What is the patient wearing?: Underwear/pull  up, Pants     Lower body assist Assist for lower body dressing: Minimal Assistance - Patient > 75%     Toileting Toileting    Toileting assist Assist for toileting: Minimal Assistance - Patient > 75%     Transfers Chair/bed transfer  Transfers assist     Chair/bed transfer assist level: Contact Guard/Touching assist     Locomotion Ambulation   Ambulation assist      Assist level: Minimal Assistance - Patient > 75%   Max distance: 1020'   Walk 10 feet activity   Assist     Assist level: Minimal Assistance - Patient > 75% Assistive device: No Device   Walk 50 feet activity   Assist    Assist level: Minimal Assistance - Patient >  75% Assistive device: No Device    Walk 150 feet activity   Assist    Assist level: Minimal Assistance - Patient > 75% Assistive device: No Device    Walk 10 feet on uneven surface  activity   Assist     Assist level: Minimal Assistance - Patient > 75%     Wheelchair     Assist Is the patient using a wheelchair?: Yes Type of Wheelchair: Manual    Wheelchair assist level: Dependent - Patient 0% Max wheelchair distance: 150'    Wheelchair 50 feet with 2 turns activity    Assist        Assist Level: Dependent - Patient 0%   Wheelchair 150 feet activity     Assist      Assist Level: Dependent - Patient 0%   Blood pressure 104/66, pulse (!) 52, temperature 97.8 F (36.6 C), resp. rate 18, height 5\' 7"  (1.702 m), weight 63 kg, SpO2 100%.  Medical Problem List and Plan: 1. 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  Will need neuro-optho evaluation after discharge.  2.  Antithrombotics: -DVT/anticoagulation:  Pharmaceutical: Lovenox             -antiplatelet therapy: N/A 3. Pain Management:  Lidocaine patch added for mid back pain/discomfort.   4. Mood/Behavior/Sleep: Melatonin 5 mg prn (used a couple pills prn). Trazodone worked but does not like hangover effect-->"does not like strong medication"             -antipsychotic agents: N/A--quit meds. Has been to "mental hospital" X 2  -8/27: No sleep difficulties, behavior appropriate.  Monitor.  5. Neuropsych/cognition: This  patient may is capable of making decisions on his own behalf.  6. Skin/Wound Care: Routine pressure relief measures.   7. Fluids/Electrolytes/Nutrition: Monitor I/O. Check CMET in am -admission labs stable -Doing well with p.o. intakes, stable, monitor weekly labs  8. Status epilepticus: Due to medication noncompliance, insomnia and 4 beers nightly.             --typical seizure--> Room gets bigger, smells something burning and LUE starts having burning sensation.              --  Has been having breakthrough seizures every couple of months per patient and mother. To continue Lamictal 250 mg BID and reinforce compliance.  Discussed compliance with medication --Valtoco 15 mg prn for seizure lasting > 2 minutes for use after discharge.  -No apparent seizures since admission to inpatient rehab  9.  Anaplastic oligodendroglioma s/p RSXN: Followed by Dr. Gypsy Decant appt tomorrow             --surgery by Dr. Maurice Small   10. H/o depression/GAD/psychosis: Reports male voice in head is very distant now. Liked Wellbutrin--nothing works.              --buspar prn resumed.   -Mood well-controlled during hospitalization, no concerns  11. H/o Polysubstance abuse: Admits to ETOH. Meth/cocaine per chart review.  --he reports that he was started on Naltrexone by PCP for ETOH abuse but not taken for up to 4 weeks per mother.   12.  Aspiration pneumonia:             -Completed course of antibiotics  13.  Hypokalemia: Recheck labs tomorrow -stable      LOS: 3 days A FACE TO FACE EVALUATION WAS PERFORMED  Angelina Sheriff 11/10/2022, 10:07 AM

## 2022-11-10 NOTE — Progress Notes (Signed)
Occupational Therapy Session Note  Patient Details  Name: Stephen Beard MRN: [id number] Date of Birth: Aug 07, 1995  Today's Date: 11/10/2022 OT Individual Time: 1103-1203 OT Individual Time Calculation (min): 60 min    Short Term Goals: Week 1:  OT Short Term Goal 1 (Week 1): STG= LTG d/t ELOS  Skilled Therapeutic Interventions/Progress Updates:  Pt greeted seated in w/c, pt agreeable to OT intervention. Session focus on BADL reeducation, functional mobility, dynamic standing balance and decreasing overall caregiver burden.    Pt requested to shave/shower. Pt completed functional ambulation in room to gather ADL items with distant supervision. Pt stood at sink for shaving > 5 mins with supervision. Pts batteries did die during shaving task, retrieved new batteries with pt having difficulty inserting d/t visual deficits, placed batteries with total A for time mgmt. Of note pt did miss one patch on L side of face during shaving d/t visual deficits needing total A to orient pt to L side of face. Education provided for recommendation of supervision during these tasks d/t visual deficits.   Pt completed ambulatory transfer to walkin shower independently with no AD, able to doff clothes and complete bathing with set- up of needed items. Pt donned all clothes independently. Pt completed functional mobility to tub room with pt able to demonstrate stepping over tub threshold with supervision.   Pt made independent in room therefore pt left up in room.          Therapy Documentation Precautions:  Precautions Precautions: Fall Precaution Comments: seizure Restrictions Weight Bearing Restrictions: No    Pain: no pain reported     Therapy/Group: Individual Therapy  Pollyann Glen [facility] [place] [place] 11/10/2022, 12:15 PM

## 2022-11-10 NOTE — Progress Notes (Signed)
Met with patient. Discussed discharge plan with family getting here around 8am to get ready for discharge.  Once family here that staff will contact PA and they will go over discharge instructions, medications and follow up appointments. Once that is complete, staff will take you down for discharge.  Feels ready to go home and feels that has done better than he expected.  Has hit his head recently but for the most part feels that is usually sitting when seizures happen and is able to tell when getting ready to have a seizure. Reports no seizures here. Discussed cognitive strategies for home and provided a handout for reminders.  Discussed diet for going home.  Discussed smoking cessation but reports that everyone in family smokes and is difficult when around it.  Suggested to try and work together to quit. Would like an appointment with an ophthalmologist at discharge. Will pass on to physician.  All needs met, patient made Mod I in room.

## 2022-11-10 NOTE — Progress Notes (Signed)
Physical Therapy Session Note  Patient Details  Name: Stephen Beard MRN: [id number] Date of Birth: Oct 12, 1995  Today's Date: 11/10/2022 PT Individual Time: [phone] and 1345-1415 PT Individual Time Calculation (min): 55 min and 30 min  Short Term Goals: Week 1:  PT Short Term Goal 1 (Week 1): STGs = LTGs  Skilled Therapeutic Interventions/Progress Updates:     1st Session: Pt received seated at E[facility] and agrees to therapy. No complaint of pain. Pt dons shoes with supervision for safety, then ambulates x300' with PT positioned on Lt side and providing cues to attend to Lt side for safety, with cues to increase stride length to decrease risk for falls. Pt participates in litegait treadmill training for bodyweight supported high level ambulation.   Pt warms up at 2.2 mph for 5:00 for total of 1000'. Pt then completes 5:00 at 2.0 mph  at incline of 5 to promote increased hip flexion and dorsiflexion requirement. PT cues for increased heel stride at initial contact to promote  Pt ambulates x3:00 at 1.5 decreasing to 1.0 mph, backward at incline of 5 to promote increased hamstring activation as well as plantarflexion.  Pt ambulates x3:00 at 1.0 mph sidestepping to the right for 266' Pt ambulates x3:00 at 1.0 mph sidestepping to the left for 266'.  Pt ambulates back to room with same cues and assistance. Left seated with all needs within reach.   2nd Session: Pt received seated in bed and agrees to therapy. No complaint of pain. Pt ambulates x300' to gym with cues for increased stride length to decrease risk for falls. Pt participates in standing balance activity with 4lb ankle weights donned bilaterally, tasked with tapping foot on 6 cones placed in semi circle around pt. Intended to challenge balance and hip flexor strength as well as encouraging Lt sided attention. PT provides minA and pt is able to complete up to x11 taps with each foot with cues for posture and lateral weight shifting. Pt  completes with both Lt and Rt legs. Following brief rest break, pt ambulates 2x1000' with ankle weights to work on endurance and hip flexor strengthening, as well as balance. PT cues for increasing step height and increased heel strike at initial contact. Pt left seated in WC with all needs within reach.   Therapy Documentation Precautions:  Precautions Precautions: Fall Precaution Comments: seizure Restrictions Weight Bearing Restrictions: No   Therapy/Group: Individual Therapy  Beau Fanny, PT, DPT 11/10/2022, 4:04 PM

## 2022-11-10 NOTE — Progress Notes (Signed)
Patient ID: Stephen Beard, male   DOB: [birth year], 27 y.o.   MRN: [id number]  Therapy team suggests outpatient therapies and no DME needed.   SW sent outpatient PT/OT/SLP referral to [facility] (p:[phone]/f:[phone]).  Cecile Sheerer, MSW, LCSWA Office: [phone] Cell: [phone] Fax: [phone]

## 2022-11-11 MED ORDER — LORATADINE 10 MG PO TABS: 10.0000 mg | ORAL_TABLET | Freq: Every day | ORAL | Status: DC

## 2022-11-11 MED ORDER — MELATONIN 3 MG PO TABS: 3.0000 mg | ORAL_TABLET | Freq: Every evening | ORAL | Status: DC | PRN

## 2022-11-11 MED ORDER — VALTOCO 15 MG DOSE 7.5 MG/0.1ML NA LQPK: 1.0000 | NASAL | 1 refills | Status: DC | PRN

## 2022-11-11 NOTE — IPOC Note (Signed)
Overall Plan of Care [facility]) Patient Details Name: Stephen Beard MRN: [id number] DOB: 11-26-1995  Admitting Diagnosis: Nontraumatic subdural hemorrhage, unspecified [facility])  Hospital Problems: Principal Problem:   Nontraumatic subdural hemorrhage, unspecified (HCC) Active Problems:   Anaplastic oligodendroglioma of frontal lobe (HCC)     Functional Problem List: Nursing Endurance, Medication Management, Motor, Safety  PT Balance, Endurance, Motor, Pain, Perception, Safety  OT Balance, Perception, Safety, Cognition, Endurance, Motor, Vision  SLP Cognition  TR         Basic ADL's: OT Bathing, Dressing, Toileting     Advanced  ADL's: OT       Transfers: PT Bed Mobility, Bed to Chair, Car, Furniture, Civil Service fast streamer, Research scientist (life sciences): PT Ambulation, Stairs     Additional Impairments: OT None  SLP Social Cognition   Memory, Attention, Awareness  TR      Anticipated Outcomes Item Anticipated Outcome  Self Feeding no goal  Swallowing      Basic self-care  (S)  Toileting  (S)   Bathroom Transfers (S)  Bowel/Bladder  continent B/B  Transfers  Supervision  Locomotion  Supervision  Communication     Cognition  supervision  Pain  less than 3  Safety/Judgment  no falls or seizures   Therapy Plan: PT Intensity: Minimum of 1-2 x/day ,45 to 90 minutes PT Frequency: 5 out of 7 days PT Duration Estimated Length of Stay: 7 days OT Intensity: Minimum of 1-2 x/day, 45 to 90 minutes OT Frequency: 5 out of 7 days OT Duration/Estimated Length of Stay: 7 days SLP Intensity: Minumum of 1-2 x/day, 30 to 90 minutes SLP Frequency: 1 to 3 out of 7 days SLP Duration/Estimated Length of Stay: 7-10 days   Team Interventions: Nursing Interventions Patient/Family Education, Disease Management/Prevention, Discharge Planning, Psychosocial Support, Medication Management  PT interventions Ambulation/gait training, Community reintegration, DME/adaptive equipment  instruction, Neuromuscular re-education, Psychosocial support, Stair training, UE/LE Strength taining/ROM, Warden/ranger, Discharge planning, Pain management, Therapeutic Activities, UE/LE Coordination activities, Cognitive remediation/compensation, Functional mobility training, Patient/family education, Therapeutic Exercise  OT Interventions Balance/vestibular training, Discharge planning, Self Care/advanced ADL retraining, Therapeutic Activities, UE/LE Coordination activities, Cognitive remediation/compensation, Functional mobility training, Patient/family education, Skin care/wound managment, Therapeutic Exercise, Visual/perceptual remediation/compensation, Wheelchair propulsion/positioning, UE/LE Strength taining/ROM, Psychosocial support, DME/adaptive equipment instruction, Community reintegration  SLP Interventions Cognitive remediation/compensation, Internal/external aids, Financial trader, Environmental controls, Therapeutic Activities, Functional tasks, Patient/family education  TR Interventions    SW/CM Interventions Discharge Planning, Psychosocial Support, Patient/Family Education   Barriers to Discharge MD  Medical stability, Home enviroment access/loayout, and Lack of/limited family support  Nursing Decreased caregiver support 1 level with level entry  PT      OT      SLP Other (comments) baseline deficits and medical hx/prognosis  SW Other (comments) transportation to help enhance independence   Team Discharge Planning: Destination: PT-Home ,OT- Home , SLP-Home Projected Follow-up: PT-24 hour supervision/assistance, Outpatient PT, OT-  Outpatient OT, SLP-Outpatient SLP Projected Equipment Needs: PT-To be determined, OT- To be determined, SLP-None recommended by SLP Equipment Details: PT- , OT-  Patient/family involved in discharge planning: PT- Patient,  OT-Patient, SLP-Patient  MD ELOS: 5-7 days Medical Rehab Prognosis:  Excellent Assessment: The patient has  been admitted for CIR therapies with the diagnosis of oligodendroma s/p excision. The team will be addressing functional mobility, strength, stamina, balance, safety, adaptive techniques and equipment, self-care, bowel and bladder mgt, patient and caregiver education, and vision. Goals have been set at supervision. Anticipated discharge destination  is home.       See Team Conference Notes for weekly updates to the plan of care

## 2022-11-11 NOTE — Progress Notes (Signed)
Patient ID: Stephen Beard, male   DOB: 04-20-1995, 27 y.o.   MRN: [id number]  SW met with pt and pt mother in room to follow-up after family edu. Aware pt discharging to home tomorrow. SW informed on outpatient referral being sent to [facility] as this is his preferred location. No questions/concerns reported.   Cecile Sheerer, MSW, LCSWA Office: [phone] Cell: [phone] Fax: [phone]

## 2022-11-11 NOTE — Progress Notes (Signed)
Speech Language Pathology Discharge Summary  Patient Details  Name: Stephen Beard MRN: [id number] Date of Birth: 1995-05-14  Date of Discharge from SLP service:November 11, 2022  Today's Date: 11/11/2022 SLP Individual Time: 0800-0830 SLP Individual Time Calculation (min): 30 min   Skilled Therapeutic Interventions:  Pt and his mom greeted at bedside. Pt was able to [facility] recall compensatory memory, organization, and information processing strategies provided from prev 2 tx sessions w/o the use of external memory aid. Additionally, SLP provided time management task initiated in prev tx session. With the use of compensatory strategies, pt was able to complete the few remaining items INDly. SLP provided additional education re remaining cognitive barriers and their anticipated negative impact on return to IADLs. He and his mom demonstrated understanding. Also reviewed compensatory memory strategies for the home environment w/ his mom (alarms for meds, turning the med Museum/gallery conservator, planner, etc) and provided them with app/book recommendations to target cognition in the home environment until OP ST begins. Any additional questions were answered and he was left in his bed. OT arriving upon SLP departure.     Patient has met 3 of 3 long term goals.  Patient to discharge at overall Supervision level.  Reasons goals not met: n/a   Clinical Impression/Discharge Summary:   Pt has made good progress this stay as evidenced by adequate use of compensatory strategies to adapt to memory, attention, information processing, and visual organization deficits during functional cognitive tasks. Pt/family education complete. He would benefit from OP ST to further target implementation of compensatory strategies to facilitate return to IADLS and maximize pt independence.   Care Partner:  Caregiver Able to Provide Assistance: Yes  Type of Caregiver Assistance: Cognitive;Physical  Recommendation:   Outpatient SLP  Rationale for SLP Follow Up: Maximize cognitive function and independence;Other (comment) (facilitate return to IADLs)   Equipment: n/a   Reasons for discharge: Discharged from hospital   Patient/Family Agrees with Progress Made and Goals Achieved: Yes    [name] 11/11/2022, 9:11 AM

## 2022-11-11 NOTE — Progress Notes (Signed)
Physical Therapy Discharge Summary  Patient Details  Name: Stephen Beard MRN: [id number] Date of Birth: 02-Jun-1995  Date of Discharge from PT service:November 11, 2022  Today's Date: 11/11/2022 PT Individual Time: 1003-1058 PT Individual Time Calculation (min): 55 min    Patient has met 9 of 9 long term goals due to improved activity tolerance, improved balance, improved postural control, improved attention, and improved awareness.  Patient to discharge at an ambulatory level Independent.   Patient's care partner is independent to provide the necessary cognitive assistance at discharge.  Reasons goals not met: NA  Recommendation:  Patient will benefit from ongoing skilled PT services in outpatient setting to continue to advance safe functional mobility, address ongoing impairments in high level balance, endurance, ambulation, and minimize fall risk.  Equipment: No equipment provided  Reasons for discharge: treatment goals met and discharge from hospital  Patient/family agrees with progress made and goals achieved: Yes  Skilled Therapeutic Interventions: Pt received supine in bed and agrees to therapy. No complaint of pain and mother present for family education. PT provides update on pt's mobility progress at rehab as well as recommendations for safe mobility following discharge. Pt performs all mobility independently during session. Pt performs bed mobility and ambulates to gym. Pt completes 6 minute walk test and achieve score of 1345', improved from 1020' on eval. Following brief rest break, pt completes Functional Gait Assessment, as detailed below, improving score by 7 points relative to eval. Pt then completes ambulation with multitasking challenged, asked to name supermarket items and animals with each letter of the alphabet. PT provides minimal cueing for cognitive challenge and no assistance required for ambulation. Pt educated on safety with ambulating outside following  discharge. Pt left seated on bed with all needs within reach.    PT Discharge Precautions/Restrictions Precautions Precautions: Fall Precaution Comments: L homonyous hemianopia Restrictions Weight Bearing Restrictions: No Pain Interference Pain Interference Pain Effect on Sleep: 1. Rarely or not at all Pain Interference with Therapy Activities: 1. Rarely or not at all Pain Interference with Day-to-Day Activities: 1. Rarely or not at all Vision/Perception  Vision - History Ability to See in Adequate Light: 0 Adequate Vision - Assessment Eye Alignment: Within Functional Limits Ocular Range of Motion: Within Functional Limits Alignment/Gaze Preference: Within Defined Limits Tracking/Visual Pursuits: Able to track stimulus in all quads without difficulty Saccades: Within functional limits Convergence: Within functional limits Perception Perception: Within Functional Limits Praxis Praxis: WFL  Cognition Overall Cognitive Status: History of cognitive impairments - at baseline Arousal/Alertness: Awake/alert Orientation Level: Oriented X4 Year: 2024 Month: August Day of Week: Correct Attention: Sustained;Selective;Focused;Alternating;Divided Focused Attention: Appears intact Sustained Attention: Appears intact Selective Attention: Appears intact Alternating Attention: Impaired Alternating Attention Impairment: Verbal complex;Functional complex Divided Attention: Impaired Divided Attention Impairment: Verbal complex;Functional basic;Functional complex Memory: Impaired Memory Impairment: Storage deficit;Retrieval deficit;Decreased recall of new information Awareness: Impaired Awareness Impairment: Emergent impairment Problem Solving: Impaired Problem Solving Impairment: Functional complex Executive Function: Organizing;Self Monitoring;Self Correcting Organizing: Impaired Organizing Impairment: Verbal complex;Functional complex Self Monitoring: Impaired Self Monitoring  Impairment: Verbal complex;Functional basic Self Correcting: Impaired Self Correcting Impairment: Verbal complex;Functional basic Safety/Judgment: Impaired Comments: reduced safety awareness [place] [employer] Scales of Cognitive Functioning: Automatic, Appropriate: Minimal Assistance for Daily Living Skills Sensation Sensation Light Touch: Appears Intact Coordination Gross Motor Movements are Fluid and Coordinated: No Fine Motor Movements are Fluid and Coordinated: Yes Motor  Motor Motor: Ataxia Motor - Skilled Clinical Observations: Mild ataxia  Mobility Bed Mobility Bed Mobility: Sit to Supine;Supine to  Sit Supine to Sit: Independent Sit to Supine: Independent Transfers Transfers: Sit to Stand;Stand to Sit;Stand Pivot Transfers Sit to Stand: Independent with assistive device Stand to Sit: Independent with assistive device Stand Pivot Transfers: Independent with assistive device Transfer (Assistive device): None Locomotion  Gait Ambulation: Yes Gait Assistance: Independent Gait Distance (Feet): 1345 Feet Assistive device: None Gait Gait: Yes Gait Pattern: Impaired Gait Pattern: Decreased stride length;Ataxic (Much improved from eval) Stairs / Additional Locomotion Stairs: Yes Stairs Assistance: Independent with assistive device Stair Management Technique: One rail Right Number of Stairs: 12 Height of Stairs: 6 Ramp: Independent Curb: Independent with assistive device Wheelchair Mobility Wheelchair Mobility: No  Trunk/Postural Assessment  Cervical Assessment Cervical Assessment: Within Functional Limits Thoracic Assessment Thoracic Assessment: Within Functional Limits Lumbar Assessment Lumbar Assessment: Within Functional Limits Postural Control Postural Control: Deficits on evaluation Righting Reactions: righting reactions delayed  Balance Balance Balance Assessed: Yes Standardized Balance Assessment Standardized Balance Assessment: Functional Gait  Assessment Static Sitting Balance Static Sitting - Balance Support: Feet supported Static Sitting - Level of Assistance: 7: Independent Dynamic Sitting Balance Dynamic Sitting - Balance Support: Feet supported Dynamic Sitting - Level of Assistance: 7: Independent Static Standing Balance Static Standing - Balance Support: During functional activity Static Standing - Level of Assistance: 7: Independent Dynamic Standing Balance Dynamic Standing - Balance Support: During functional activity Dynamic Standing - Level of Assistance: 7: Independent Functional Gait  Assessment Gait assessed : Yes Gait Level Surface: Walks 20 ft in less than 5.5 sec, no assistive devices, good speed, no evidence for imbalance, normal gait pattern, deviates no more than 6 in outside of the 12 in walkway width. Change in Gait Speed: Able to smoothly change walking speed without loss of balance or gait deviation. Deviate no more than 6 in outside of the 12 in walkway width. Gait with Horizontal Head Turns: Performs head turns smoothly with slight change in gait velocity (eg, minor disruption to smooth gait path), deviates 6-10 in outside 12 in walkway width, or uses an assistive device. Gait with Vertical Head Turns: Performs task with slight change in gait velocity (eg, minor disruption to smooth gait path), deviates 6 - 10 in outside 12 in walkway width or uses assistive device Gait and Pivot Turn: Pivot turns safely within 3 sec and stops quickly with no loss of balance. Step Over Obstacle: Is able to step over one shoe box (4.5 in total height) without changing gait speed. No evidence of imbalance. Gait with Narrow Base of Support: Ambulates 7-9 steps. Gait with Eyes Closed: Walks 20 ft, uses assistive device, slower speed, mild gait deviations, deviates 6-10 in outside 12 in walkway width. Ambulates 20 ft in less than 9 sec but greater than 7 sec. Ambulating Backwards: Walks 20 ft, slow speed, abnormal gait pattern,  evidence for imbalance, deviates 10-15 in outside 12 in walkway width. Steps: Alternating feet, must use rail. Total Score: 22 Extremity Assessment  RUE Assessment RUE Assessment: Within Functional Limits LUE Assessment LUE Assessment: Within Functional Limits RLE Assessment RLE Assessment: Exceptions to [facility] General Strength Comments: Endurance impaired but otherwise WFL LLE Assessment LLE Assessment: Exceptions to [facility] General Strength Comments: Endurance impaired but otherwise [facility]   Beau Fanny, PT, DPT 11/11/2022, 11:27 AM

## 2022-11-11 NOTE — Progress Notes (Signed)
PROGRESS NOTE   Subjective/Complaints:  No events overnight.  No acute complaints. Vitals stable  Mother at bedside, all questions answered.  Discussed required follow-ups after discharge, including recommendation for neuro-ophthalmologist for homonymous hemianopsia, as well as follow-up with oncologist and surgeon.  ROS: Denies fevers, chills, N/V, abdominal pain, constipation, diarrhea, SOB, cough, chest pain, new weakness or paraesthesias.  Per HPI above.  Objective:   No results found. Recent Labs    11/10/22 0610  WBC 6.0  HGB 15.4  HCT 44.6  PLT 346   Recent Labs    11/10/22 0610  NA 137  K 4.4  CL 100  CO2 28  GLUCOSE 104*  BUN 14  CREATININE 0.81  CALCIUM 9.6    Intake/Output Summary (Last 24 hours) at 11/11/2022 1124 Last data filed at 11/11/2022 0752 Gross per 24 hour  Intake 480 ml  Output --  Net 480 ml        Physical Exam: Vital Signs Blood pressure 104/69, pulse 62, temperature 97.9 F (36.6 C), resp. rate 18, height 5\' 7"  (1.702 m), weight 64.2 kg, SpO2 100%.   PE: Constitution: Appropriate appearance for age. No apparent distress .  Sitting up at bedside. Resp: No respiratory distress. No accessory muscle usage. on RA and CTAB Cardio: Well perfused appearance. No m/r/g. RRR. No peripheral edema. Abdomen: Nondistended. Nontender.  +BS Psych: Appropriate mood and affect. Neuro: AAOx4. No apparent cognitive deficits  + Bilateral nystagmus with EOMI -no longer present + Left homonymous hemianopsia, affecting both eyes-ongoing  Neurologic Exam:   Sensory exam: revealed normal sensation in all dermatomal regions in bilateral upper extremities and bilateral lower extremities Motor exam: strength 5/5 throughout bilateral upper extremities and bilateral lower extremities Coordination: Fine motor coordination  normal in bilateral upper and lower extremities      Assessment/Plan: 1.  Functional deficits which require 3+ hours per day of interdisciplinary therapy in a comprehensive inpatient rehab setting. Physiatrist is providing close team supervision and 24 hour management of active medical problems listed below. Physiatrist and rehab team continue to assess barriers to discharge/monitor patient progress toward functional and medical goals  Care Tool:  Bathing    Body parts bathed by patient: Right arm, Left arm, Right lower leg, Chest, Left upper leg, Left lower leg, Abdomen, Front perineal area, Buttocks, Right upper leg, Face         Bathing assist Assist Level: Independent with assistive device     Upper Body Dressing/Undressing Upper body dressing   What is the patient wearing?: Pull over shirt    Upper body assist Assist Level: Independent    Lower Body Dressing/Undressing Lower body dressing      What is the patient wearing?: Underwear/pull up, Pants     Lower body assist Assist for lower body dressing: Independent     Toileting Toileting    Toileting assist Assist for toileting: Independent with assistive device     Transfers Chair/bed transfer  Transfers assist     Chair/bed transfer assist level: Independent with assistive device     Locomotion Ambulation   Ambulation assist      Assist level: Minimal Assistance - Patient > 75%  Max distance: 1020'   Walk 10 feet activity   Assist     Assist level: Minimal Assistance - Patient > 75% Assistive device: No Device   Walk 50 feet activity   Assist    Assist level: Minimal Assistance - Patient > 75% Assistive device: No Device    Walk 150 feet activity   Assist    Assist level: Minimal Assistance - Patient > 75% Assistive device: No Device    Walk 10 feet on uneven surface  activity   Assist     Assist level: Minimal Assistance - Patient > 75%     Wheelchair     Assist Is the patient using a wheelchair?: Yes Type of Wheelchair: Manual     Wheelchair assist level: Dependent - Patient 0% Max wheelchair distance: 150'    Wheelchair 50 feet with 2 turns activity    Assist        Assist Level: Dependent - Patient 0%   Wheelchair 150 feet activity     Assist      Assist Level: Dependent - Patient 0%   Blood pressure 104/69, pulse 62, temperature 97.9 F (36.6 C), resp. rate 18, height 5\' 7"  (1.702 m), weight 64.2 kg, SpO2 100%.  Medical Problem List and Plan: 1. 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  Will need neuro-optho evaluation after discharge.  2.  Antithrombotics: -DVT/anticoagulation:  Pharmaceutical: Lovenox             -antiplatelet therapy: N/A  3. Pain Management:  Lidocaine patch added for mid back pain/discomfort.   4. Mood/Behavior/Sleep: Melatonin 5 mg prn (used a couple pills prn). Trazodone worked but does not like hangover effect-->"does not like strong medication"             -antipsychotic agents: N/A--quit meds. Has been to "mental hospital" X 2  -8/27: No sleep difficulties, behavior appropriate.  Monitor.  5. Neuropsych/cognition: This patient may is capable of making decisions on his own behalf.  6. Skin/Wound Care: Routine pressure relief measures.   7. Fluids/Electrolytes/Nutrition: Monitor I/O. Check CMET in am -admission labs stable -Doing well with p.o. intakes, stable, monitor weekly labs  8. Status epilepticus: Due to medication noncompliance, insomnia and 4 beers nightly.              --typical seizure--> Room gets bigger, smells something burning and LUE starts having burning sensation.              --Has been having breakthrough seizures every couple of months per patient and mother. To continue Lamictal 250 mg BID and reinforce compliance.  Discussed compliance with medication --Valtoco 15 mg prn for seizure lasting > 2 minutes for use after discharge.  -No apparent seizures since admission to inpatient rehab  9.  Anaplastic oligodendroglioma s/p RSXN: Followed by Dr. Gypsy Decant appt tomorrow             --surgery by Dr.  Ostergard ; will need to follow-up with him as an outpatient  10. H/o depression/GAD/psychosis: Reports male voice in head is very distant now. Liked Wellbutrin--nothing works.              --buspar prn resumed.   -Mood well-controlled during hospitalization, no concerns  11. H/o Polysubstance abuse: Admits to ETOH. Meth/cocaine per chart review.  --he reports that he was started on Naltrexone by PCP for ETOH abuse but not taken for up to 4 weeks per mother.  -Encourage posthospital follow-up with his PCP and resumption of naltrexone, given new brain injury to prevent risk of falls and further cognitive decline with substance abuse  12.  Aspiration pneumonia:             -Completed course of antibiotics  13.  Hypokalemia: Recheck labs tomorrow -stable      LOS: 4 days A FACE TO FACE EVALUATION WAS PERFORMED  Angelina Sheriff 11/11/2022, 11:24 AM

## 2022-11-11 NOTE — Progress Notes (Signed)
Inpatient Rehabilitation Discharge Medication Review by a Pharmacist  A complete drug regimen review was completed for this patient to identify any potential clinically significant medication issues.  High Risk Drug Classes Is patient taking? Indication by Medication  Antipsychotic No    Anticoagulant No   Antibiotic No   Opioid No   Antiplatelet No   Hypoglycemics/insulin No   Vasoactive Medication No   Chemotherapy No   Other Yes Benadryl- itching Melatonin- sleep Lamictal- seizure ppx     Type of Medication Issue Identified Description of Issue Recommendation(s)  Drug Interaction(s) (clinically significant)     Duplicate Therapy     Allergy     No Medication Administration End Date     Incorrect Dose     Additional Drug Therapy Needed     Significant med changes from prior encounter (inform family/care partners about these prior to discharge).    Other  PTA meds: Valtoco Restart PTA meds when and if necessary during CIR admission or at time of discharge, if warranted    Clinically significant medication issues were identified that warrant physician communication and completion of prescribed/recommended actions by midnight of the next day:  No  Time spent performing this drug regimen review (minutes):  30   Jani Gravel, PharmD Clinical Pharmacist  11/12/2022 9:31 AM

## 2022-11-11 NOTE — Discharge Instructions (Addendum)
Inpatient Rehab Discharge Instructions  Stephen [name] Ma Discharge date and time: 11/12/22   Activities/Precautions/ Functional Status: Activity: no lifting, driving, or strenuous exercise  till cleared by MD Diet: regular diet Wound Care: none needed   Functional status:  ___ No restrictions     ___ Walk up steps independently ___ 24/7 supervision/assistance   ___ Walk up steps with assistance _X__ Intermittent supervision/assistance  ___ Bathe/dress independently ___ Walk with walker     ___ Bathe/dress with assistance ___ Walk Independently    ___ Shower independently ___ Walk with assistance    __X_ Shower with supervision  _X__ No alcohol, cocaine or Marijuana.  ___ Return to work/school ________   Special Instructions: You have to take your medications as prescribed  Per [facility] statutes, patients with seizures are not allowed to drive until  they have been seizure-free for six months. Use caution when using heavy equipment or power tools. Avoid working on ladders or at heights. Take showers instead of baths. Ensure the water temperature is not too high on the home water heater. Do not go swimming alone. When caring for infants or small children, sit down when holding, feeding, or changing them to minimize risk of injury to the child in the event you have a seizure. Also, Maintain good sleep hygiene. Avoid alcohol.    My questions have been answered and I understand these instructions. I will adhere to these goals and the provided educational materials after my discharge from the hospital.  Patient/Caregiver Signature _______________________________ Date __________  Clinician Signature _______________________________________ Date __________  Please bring this form and your medication list with you to all your follow-up doctor's appointments.

## 2022-11-11 NOTE — Plan of Care (Signed)
  Problem: RH Memory Goal: LTG Patient will use memory compensatory aids to (SLP) Description: LTG:  Patient will use memory compensatory aids to recall biographical/new, daily complex information with cues (SLP) Outcome: Completed/Met   Problem: RH Pre-functional/Other (Specify) Goal: RH LTG SLP (Specify) 1 Description: RH LTG SLP (Specify) 1 Outcome: Completed/Met Goal: RH LTG SLP (Specify) 2 Description: RH LTG SLP (Specify) 2 Outcome: Completed/Met

## 2022-11-11 NOTE — Progress Notes (Signed)
Occupational Therapy Session Note  Patient Details  Name: Stephen Beard MRN: [id number] Date of Birth: March 19, 1995  Today's Date: 11/11/2022 OT Individual Time: 0830-0930 OT Individual Time Calculation (min): 60 min    Short Term Goals: Week 1:  OT Short Term Goal 1 (Week 1): STG= LTG d/t ELOS  Skilled Therapeutic Interventions/Progress Updates:    Family education session completed with pt and his mother. Verbal education provided re fall risk reduction, energy conservation strategies, home carryover of transfer training, ADLs, and IADLs. Demonstration and education completed for pt performance of UB/LB bathing and dressing at mod I level, toileting hygiene and transfers, and shower transfers. He completed a shower at mod I level overall. Seizure precautions reviewed. Discussed neuro-ophthalmologist follow up and deferred to MD for referral. Emphasized reduced awareness and visual deficits. Pt left sitting EOB- mod I in room.    Therapy Documentation Precautions:  Precautions Precautions: Fall Precaution Comments: seizure Restrictions Weight Bearing Restrictions: No  Therapy/Group: Individual Therapy  Crissie Reese 11/11/2022, 6:45 AM

## 2022-11-11 NOTE — Discharge Summary (Signed)
Physician Discharge Summary  Patient ID: Stephen Beard MRN: [id number] DOB/AGE: 1996/01/27 27 y.o.  Admit date: 11/07/2022 Discharge date: 11/12/2022  Discharge Diagnoses:  Principal Problem:   Nontraumatic subdural hemorrhage, unspecified (HCC) Active Problems:   Schizoaffective disorder, depressive type (HCC)   Alcohol abuse   Anaplastic oligodendroglioma of frontal lobe (HCC)   Discharged Condition: good  Significant Diagnostic Studies: N/A   Labs:  Basic Metabolic Panel: Recent Labs  Lab 11/05/22 0504 11/08/22 0803 11/10/22 0610  NA 135 135 137  K 3.6 4.2 4.4  CL 102 103 100  CO2 20* 24 28  GLUCOSE 143* 92 104*  BUN <5* 17 14  CREATININE 0.57* 1.24 0.81  CALCIUM 9.2 9.3 9.6  MG 2.0  --   --   PHOS 5.3*  --   --     CBC: Recent Labs  Lab 11/05/22 0504 11/08/22 0803 11/10/22 0610  WBC 6.5 5.3 6.0  NEUTROABS  --  2.8  --   HGB 15.0 16.3 15.4  HCT 41.5 47.0 44.6  MCV 94.7 97.7 96.3  PLT 143* 279 346    CBG: Recent Labs  Lab 11/06/22 1234 11/06/22 1605 11/06/22 2158 11/07/22 0629 11/07/22 1152  GLUCAP 143* 114* 110* 98 125*    Brief HPI:   Stephen Beard is a 27 y.o. male with history of anxiety/depression, psychosis, bifrontal oligodendroglioma s/p RSXN and XRT, seizure disorder who was admitted on 11/01/22 with seizure lasting more than 30 minutes. He was intubated and sedated for airway protection and loaded with Keppra. Mother reported breakthrough seizure every couple of mouths, medication noncompliance as well as use of 3 large cans of beer daily. He was placed on LT-EEG and Vmipat added briefly but was discontinued due to history of noncompliance. He was educated on sleep deprivation, medication compliance as well as ETOH/drug cessation. Therapy was working with patient who was limited by LE weakness with STM deficits and poor safety awareness. CIR was recommended due to functional decline.    Hospital Course: Stephen Beard was  admitted to rehab 11/07/2022 for inpatient therapies to consist of PT, ST and OT at least three hours five days a week. Past admission physiatrist, therapy team and rehab RN have worked together to provide customized collaborative inpatient rehab. He was maintained on home dose Lamictal and has been seizure free during his stay. His blood pressures were monitored on TID basis and has been stable. Melatonin was added prn to help manage insomnia. Lidocaine patch was added to help with midback pain which had resolved by discharge.  Follow up CBC showed H/H to be stable and thrombocytopenia has resolved. His vision evaluated by OT revealing left HH and neuro-ophthalmology follow up recommended. He has been educated on importance of medication compliance as well as ETOH cessation. He made good gains and was modified independent at discharge. He will continue to receive follow up outpatient PT, ST and OT at [facility] after discharge.    Rehab course: During patient's stay in rehab brief team conference was held to monitor patient's progress, set goals and discuss barriers to discharge. At admission, patient required min assist with basic ADL tasks and with mobility. He reported slight exacerbation of baseline mild cognitive deficits. He  has had improvement in activity tolerance, balance, postural control as well as ability to compensate for deficits. He is able to complete ADL tasks at modified independent level.  He is independent for transfers and to ambulate 1345' without AD. He is  able to climb 12 stairs with use of one rail. His balance remains impaired with Berg Balance score of 22/56. He is able to complete cognitive tasks with supervision. Family education has been completed.   Disposition: Home  Diet: Regular.   Special Instructions: No alcohol or illicit substances. Patient advised to take medications as prescribed. No driving till seizure free for 6 months.    Discharge Instructions      Ambulatory referral to Occupational Therapy   Complete by: As directed    Eval and treat   Ambulatory referral to Ophthalmology   Complete by: As directed    Ambulatory referral to Physical Therapy   Complete by: As directed    Eval and treat   Ambulatory referral to Speech Therapy   Complete by: As directed    Eval and treat      Allergies as of 11/11/2022       Reactions   Prozac [fluoxetine]    Intolerable nausea/2015 office notes        Medication List     TAKE these medications    acetaminophen 325 MG tablet Commonly known as: TYLENOL Take 1-2 tablets (325-650 mg total) by mouth every 4 (four) hours as needed for mild pain.   benzocaine 10 % mucosal gel Commonly known as: ORAJEL Use as directed in the mouth or throat 4 (four) times daily as needed for mouth pain.   lamoTRIgine 150 MG tablet Commonly known as: LAMICTAL TAKE 1 TABLET BY MOUTH TWICE DAILY.TAKE WITH 100 MG TABLET.(250 MG TWICE DAILY TOTAL). What changed: See the new instructions.   lamoTRIgine 100 MG tablet Commonly known as: LAMICTAL TAKE 1 TABLET BY MOUTH TWICE DAILY.TAKE ALONG WITH ONE 150 MG TABLET (250 MG TWICE DAILY TOTAL) What changed: See the new instructions.   loratadine 10 MG tablet Commonly known as: CLARITIN Take 1 tablet (10 mg total) by mouth daily. Start taking on: November 12, 2022   melatonin 3 MG Tabs tablet Take 1 tablet (3 mg total) by mouth at bedtime as needed.   Valtoco 15 MG Dose 7.5 MG/0.1ML Lqpk Generic drug: diazePAM (15 MG Dose) Place 1 puff into the nose as needed (for seizure lasting longer than 2 minutes).        Follow-up Information     [facility]. Call.   Specialty: Family Medicine Why: on Tuesday to make post hospital follow up Contact information: [address] [place] [address] [phone]         Angelina Sheriff, DO. Call.   Specialty: Physical Medicine and Rehabilitation Why: As  needed Contact information: [address] [place] [address] [phone]         Henreitta Leber, MD Follow up.   Specialties: Psychiatry, Neurology, Oncology Contact information: [address] [place] [address] [phone]                 Signed: Jacquelynn Cree 11/11/2022, 4:19 PM

## 2022-11-11 NOTE — Progress Notes (Signed)
Occupational Therapy Discharge Summary  Patient Details  Name: Stephen Beard MRN: [id number] Date of Birth: September 25, 1995  Date of Discharge from OT service:11/11/22  Today's Date: 11/11/2022 OT Individual Time: 1415-1500 OT Individual Time Calculation (min): 45 min    Patient has met 8 of 8 long term goals due to improved activity tolerance, improved balance, postural control, ability to compensate for deficits, and improved awareness.  Patient to discharge at overall Modified Independent level.  Patient's care partner is independent to provide the necessary cognitive assistance at discharge. Stephen Beard has made fast progress in CIR, progressing beyond his goals to a mod I level. He is limited mainly by reduced awareness into deficits and L homonyous hemianopia, unclear whether these deficits are baseline like pt thinks they are or not.   Recommendation:  Patient will benefit from ongoing skilled OT services in outpatient setting to continue to advance functional skills in the area of BADL and iADL.  Equipment: No equipment provided  Reasons for discharge: treatment goals met and discharge from hospital  Patient/family agrees with progress made and goals achieved: Yes  Skilled OT intervention Session focused on visual exercises to promote improved compensation for L homonyous hemianopsia to reduce fall risk and promote improved independence with community level accessibility and IADLs. Discussed carryover to functional tasks throughout. Used the BITS to address functional scanning/tracking, head turning, and convergence tasks. He then completed 500 ft of functional mobility with mod I but slow pace and requiring cueing for head turning and increasing pace. He was provided a handout on homonyous hemianopsia. He returned to his room and was left sitting EOB with all needs met.    OT Discharge Precautions/Restrictions  Precautions Precautions: Fall Precaution Comments: L homonyous  hemianopia Restrictions Weight Bearing Restrictions: No   ADL ADL Eating: Independent Where Assessed-Eating: Edge of bed Grooming: Independent Where Assessed-Grooming: Standing at sink Upper Body Bathing: Modified independent Where Assessed-Upper Body Bathing: Shower Lower Body Bathing: Modified independent Where Assessed-Lower Body Bathing: Shower Upper Body Dressing: Modified independent (Device) Where Assessed-Upper Body Dressing: Edge of bed Lower Body Dressing: Modified independent Where Assessed-Lower Body Dressing: Edge of bed Toileting: Modified independent Where Assessed-Toileting: Teacher, adult education: Engineer, agricultural Method: Insurance claims handler: Modified independent Web designer Method: Ship broker: Insurance underwriter: Modified independent Film/video editor Method: Designer, industrial/product: Information systems manager with back Vision Baseline Vision/History: 1 Wears glasses Patient Visual Report: No change from baseline Vision Assessment?: Yes Eye Alignment: Within Functional Limits Ocular Range of Motion: Within Functional Limits Alignment/Gaze Preference: Within Defined Limits Tracking/Visual Pursuits: Able to track stimulus in all quads without difficulty Saccades: Within functional limits Convergence: Within functional limits Visual Fields: Left homonymous hemianopsia Perception  Perception: Within Functional Limits Praxis Praxis: WFL Cognition Cognition Overall Cognitive Status: History of cognitive impairments - at baseline Arousal/Alertness: Awake/alert Orientation Level: Person;Place;Situation Person: Oriented Place: Oriented Situation: Oriented Memory: Impaired Memory Impairment: Storage deficit;Retrieval deficit;Decreased recall of new information Attention: Alternating Alternating Attention Impairment: Verbal complex;Functional complex Awareness  Impairment: Emergent impairment Problem Solving: Impaired Problem Solving Impairment: Functional complex Safety/Judgment: Impaired Comments: reduced safety awareness Brief Interview for Mental Status (BIMS) Repetition of Three Words (First Attempt): 3 Temporal Orientation: Year: Correct Temporal Orientation: Month: Accurate within 5 days Temporal Orientation: Day: Correct Recall: "Sock": Yes, no cue required Recall: "Blue": Yes, no cue required Recall: "Bed": Yes, no cue required BIMS Summary Score: 15 Sensation Sensation Light Touch: Appears Intact Coordination Gross Motor Movements are Fluid and  Coordinated: No Fine Motor Movements are Fluid and Coordinated: Yes Motor  Motor Motor: Ataxia Motor - Skilled Clinical Observations: Mild ataxia Mobility  Bed Mobility Bed Mobility: Sit to Supine;Supine to Sit Supine to Sit: Independent Sit to Supine: Independent Transfers Sit to Stand: Independent with assistive device Stand to Sit: Independent with assistive device  Trunk/Postural Assessment  Cervical Assessment Cervical Assessment: Within Functional Limits Thoracic Assessment Thoracic Assessment: Within Functional Limits Lumbar Assessment Lumbar Assessment: Within Functional Limits Postural Control Postural Control: Deficits on evaluation Righting Reactions: righting reactions delayed  Balance Balance Balance Assessed: Yes Static Sitting Balance Static Sitting - Balance Support: Feet supported Static Sitting - Level of Assistance: 7: Independent Dynamic Sitting Balance Dynamic Sitting - Balance Support: Feet supported Dynamic Sitting - Level of Assistance: 7: Independent Static Standing Balance Static Standing - Balance Support: During functional activity Static Standing - Level of Assistance: 6: Modified independent (Device/Increase time) Dynamic Standing Balance Dynamic Standing - Balance Support: During functional activity Dynamic Standing - Level of  Assistance: 6: Modified independent (Device/Increase time) Extremity/Trunk Assessment RUE Assessment RUE Assessment: Within Functional Limits LUE Assessment LUE Assessment: Within Functional Limits   Crissie Reese 11/11/2022, 8:21 AM

## 2022-11-12 ENCOUNTER — Other Ambulatory Visit: Payer: Self-pay | Admitting: Physical Medicine and Rehabilitation

## 2022-11-12 ENCOUNTER — Other Ambulatory Visit: Payer: Self-pay | Admitting: Internal Medicine

## 2022-11-12 DIAGNOSIS — G40901 Epilepsy, unspecified, not intractable, with status epilepticus: Secondary | ICD-10-CM

## 2022-11-12 DIAGNOSIS — R569 Unspecified convulsions: Secondary | ICD-10-CM

## 2022-11-12 MED ORDER — LAMOTRIGINE 100 MG PO TABS: 100.0000 mg | ORAL_TABLET | Freq: Two times a day (BID) | ORAL | 0 refills | Status: DC

## 2022-11-13 NOTE — Progress Notes (Signed)
PROGRESS NOTE   Subjective/Complaints:  No events overnight.  No acute complaints. Vitals stable Feeling prepared for discharge.  Reviewed medication list, as well as follow-up requests.  Later, mom calls to state that they have Lamictal 150 mg at home, but requiring additional 100 mg tabs for prescribed to 50 mg twice daily.  Prescription sent to their [facility] pharmacy.  ROS: Denies fevers, chills, N/V, abdominal pain, constipation, diarrhea, SOB, cough, chest pain, new weakness or paraesthesias.  Per HPI above.  Objective:   No results found. Recent Labs    11/10/22 0610  WBC 6.0  HGB 15.4  HCT 44.6  PLT 346   Recent Labs    11/10/22 0610  NA 137  K 4.4  CL 100  CO2 28  GLUCOSE 104*  BUN 14  CREATININE 0.81  CALCIUM 9.6    Intake/Output Summary (Last 24 hours) at 11/13/2022 0031 Last data filed at 11/12/2022 0801 Gross per 24 hour  Intake 240 ml  Output --  Net 240 ml        Physical Exam: Vital Signs Blood pressure (!) 102/58, pulse 61, temperature 97.7 F (36.5 C), temperature source Oral, resp. rate 18, height 5\' 7"  (1.702 m), weight 64.2 kg, SpO2 98%.   PE: Constitution: Appropriate appearance for age. No apparent distress .  Sitting up at bedside. Resp: No respiratory distress. No accessory muscle usage. on RA and CTAB Cardio: Well perfused appearance. No m/r/g. RRR. No peripheral edema. Abdomen: Nondistended. Nontender.  +BS Psych: Appropriate mood and affect. Neuro: AAOx4. No apparent cognitive deficits  + Mild bilateral upper extremity ataxia, left greater than right + Left homonymous hemianopsia, affecting both eyes-ongoing  Neurologic Exam:   Sensory exam: revealed normal sensation in all dermatomal regions in bilateral upper extremities and bilateral lower extremities Motor exam: strength 5/5 throughout bilateral upper extremities and bilateral lower extremities       Assessment/Plan: 1. Functional deficits which require 3+ hours per day of interdisciplinary therapy in a comprehensive inpatient rehab setting. Physiatrist is providing close team supervision and 24 hour management of active medical problems listed below. Physiatrist and rehab team continue to assess barriers to discharge/monitor patient progress toward functional and medical goals  Care Tool:  Bathing    Body parts bathed by patient: Right arm, Left arm, Right lower leg, Chest, Left upper leg, Left lower leg, Abdomen, Front perineal area, Buttocks, Right upper leg, Face         Bathing assist Assist Level: Independent with assistive device     Upper Body Dressing/Undressing Upper body dressing   What is the patient wearing?: Pull over shirt    Upper body assist Assist Level: Independent    Lower Body Dressing/Undressing Lower body dressing      What is the patient wearing?: Underwear/pull up, Pants     Lower body assist Assist for lower body dressing: Independent     Toileting Toileting    Toileting assist Assist for toileting: Independent     Transfers Chair/bed transfer  Transfers assist     Chair/bed transfer assist level: Independent     Locomotion Ambulation   Ambulation assist      Assist level:  Independent   Max distance: 1345'   Walk 10 feet activity   Assist     Assist level: Independent Assistive device: No Device   Walk 50 feet activity   Assist    Assist level: Independent Assistive device: No Device    Walk 150 feet activity   Assist    Assist level: Independent Assistive device: No Device    Walk 10 feet on uneven surface  activity   Assist     Assist level: Independent     Wheelchair     Assist Is the patient using a wheelchair?: No Type of Wheelchair: Manual    Wheelchair assist level: Dependent - Patient 0% Max wheelchair distance: 150'    Wheelchair 50 feet with 2 turns  activity    Assist        Assist Level: Dependent - Patient 0%   Wheelchair 150 feet activity     Assist      Assist Level: Dependent - Patient 0%   Blood pressure (!) 102/58, pulse 61, temperature 97.7 F (36.5 C), temperature source Oral, resp. rate 18, height 5\' 7"  (1.702 m), weight 64.2 kg, SpO2 98%.  Medical Problem List and Plan: 1. Functional deficits secondary to  anaplastic oligodendroglioma status post excision and chemotherapy with residual remaining tumor complicated by intracerebral hematoma with epilepsy and breakthrough seizures requiring intubation             -patient may shower             -ELOS/Goals: 7 to 10 days, supervision PT/OT, mod I with ST -tentative discharge Saturday, as he is at goals for therapies             -Stable for discharge from CIR  -8-29: Vision deficits noted by OT, confirmed on field testing left homonymous hemianopsia.  Will need neuro-optho evaluation after discharge -referral made.  Patient and mother aware that he is not allowed to drive.    2.  Antithrombotics: -DVT/anticoagulation:  Pharmaceutical: Lovenox             -antiplatelet therapy: N/A  3. Pain Management:  Lidocaine patch added for mid back pain/discomfort.   4. Mood/Behavior/Sleep: Melatonin 5 mg prn (used a couple pills prn). Trazodone worked but does not like hangover effect-->"does not like strong medication"             -antipsychotic agents: N/A--quit meds. Has been to "mental hospital" X 2  -8/27: No sleep difficulties, behavior appropriate.  Monitor.  5. Neuropsych/cognition: This patient may is capable of making decisions on his own behalf.  6. Skin/Wound Care: Routine pressure relief measures.   7. Fluids/Electrolytes/Nutrition: Monitor I/O. Check CMET in am -admission labs stable -Doing well with p.o. intakes, stable, monitor weekly labs  8. Status epilepticus: Due to medication noncompliance, insomnia and 4 beers nightly.             --typical  seizure--> Room gets bigger, smells something burning and LUE starts having burning sensation.              --Has been having breakthrough seizures every couple of months per patient and mother. To continue Lamictal 250 mg BID and reinforce compliance.  Discussed compliance with medication --Valtoco 15 mg prn for seizure lasting > 2 minutes for use after discharge.  -No apparent seizures since admission to inpatient rehab  9.  Anaplastic oligodendroglioma s/p RSXN: Followed by Dr. Gypsy Decant appt tomorrow             --  surgery by Dr. Maurice Small ; will need to follow-up with him as an outpatient  10. H/o depression/GAD/psychosis: Reports male voice in head is very distant now. Liked Wellbutrin--nothing works.              --buspar prn resumed.   -Mood well-controlled during hospitalization, no concerns  11. H/o Polysubstance abuse: Admits to ETOH. Meth/cocaine per chart review.  --he reports that he was started on Naltrexone by PCP for ETOH abuse but not taken for up to 4 weeks per mother.  -Encourage posthospital follow-up with his PCP and resumption of naltrexone, given new brain injury to prevent risk of falls and further cognitive decline with substance abuse  12.  Aspiration pneumonia:             -Completed course of antibiotics  13.  Hypokalemia: Recheck labs tomorrow -stable      LOS: 5 days A FACE TO FACE EVALUATION WAS PERFORMED  Angelina Sheriff 11/13/2022, 12:31 AM

## 2022-11-15 ENCOUNTER — Telehealth: Payer: Self-pay | Admitting: Internal Medicine

## 2022-11-15 NOTE — Progress Notes (Signed)
Inpatient Rehabilitation Care Coordinator Discharge Note   Patient Details  Name: Stephen Beard MRN: [id number] Date of Birth: 27-Aug-1995   Discharge location: D/c to home  Length of Stay: 4 days  Discharge activity level: Independent  Home/community participation: Limited  Patient response MV:HQIONG Literacy - How often do you need to have someone help you when you read instructions, pamphlets, or other written material from your doctor or pharmacy?: Never  Patient response EX:BMWUXL Isolation - How often do you feel lonely or isolated from those around you?: Never  Services provided included: MD, RD, PT, SLP, TR, Pharmacy, Neuropsych, SW, CM, RN, OT  Financial Services:  Financial Services Utilized: Medicare    Choices offered to/list presented to: patient  Follow-up services arranged:  Outpatient    Outpatient Servicies: [facility] for PT/OT/SLP      Patient response to transportation need: Is the patient able to respond to transportation needs?: Yes In the past 12 months, has lack of transportation kept you from medical appointments or from getting medications?: No In the past 12 months, has lack of transportation kept you from meetings, work, or from getting things needed for daily living?: No   Patient/Family verbalized understanding of follow-up arrangements:  Yes  Individual responsible for coordination of the follow-up plan: contact pt [phone]  Confirmed correct DME delivered: Gretchen Short 11/15/2022    Comments (or additional information):fam edu completed.   Summary of Stay    Date/Time Discharge Planning CSW  11/07/22 1338 TBA. Per EMR, pt lives with his mother and stepfather. Mother to provide 24/7 care. SW will confirm there are no barriers to discharge. AAC       Lanesha Azzaro A Lula Olszewski

## 2022-11-16 ENCOUNTER — Other Ambulatory Visit: Payer: Self-pay

## 2022-11-16 ENCOUNTER — Encounter: Payer: Self-pay | Admitting: Physical Therapy

## 2022-11-16 ENCOUNTER — Ambulatory Visit: Payer: Medicare Other | Admitting: Speech Pathology

## 2022-11-16 ENCOUNTER — Encounter: Payer: Self-pay | Admitting: Occupational Therapy

## 2022-11-16 ENCOUNTER — Ambulatory Visit: Payer: Medicare Other | Attending: Physical Medicine and Rehabilitation | Admitting: Physical Therapy

## 2022-11-16 ENCOUNTER — Ambulatory Visit: Payer: Medicare Other | Admitting: Occupational Therapy

## 2022-11-16 DIAGNOSIS — R296 Repeated falls: Secondary | ICD-10-CM | POA: Diagnosis present

## 2022-11-16 DIAGNOSIS — M6281 Muscle weakness (generalized): Secondary | ICD-10-CM | POA: Insufficient documentation

## 2022-11-16 DIAGNOSIS — R2689 Other abnormalities of gait and mobility: Secondary | ICD-10-CM | POA: Insufficient documentation

## 2022-11-16 DIAGNOSIS — R41841 Cognitive communication deficit: Secondary | ICD-10-CM

## 2022-11-16 DIAGNOSIS — R29818 Other symptoms and signs involving the nervous system: Secondary | ICD-10-CM | POA: Insufficient documentation

## 2022-11-16 DIAGNOSIS — R2681 Unsteadiness on feet: Secondary | ICD-10-CM | POA: Diagnosis present

## 2022-11-16 DIAGNOSIS — R41842 Visuospatial deficit: Secondary | ICD-10-CM | POA: Diagnosis present

## 2022-11-16 DIAGNOSIS — R41844 Frontal lobe and executive function deficit: Secondary | ICD-10-CM

## 2022-11-16 DIAGNOSIS — R278 Other lack of coordination: Secondary | ICD-10-CM

## 2022-11-16 NOTE — Therapy (Signed)
OUTPATIENT SPEECH LANGUAGE PATHOLOGY EVALUATION   Patient Name: Stephen Beard MRN: [id number] DOB:1995/11/21, 27 y.o., male Today's Date: 11/21/2022  PCP: Virgilio Belling REFERRING PROVIDER: Jacquelynn Cree, PA-C  END OF SESSION:    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HCC)    GAD (generalized anxiety disorder)    Headache    Intentional overdose (HCC) 12/2019   Psychotic disorder (HCC)    Schizoaffective disorder (HCC)    Seizures (HCC)    Substance abuse (HCC)    Suicidal ideation    Past Surgical History:  Procedure Laterality Date   APPLICATION OF CRANIAL NAVIGATION N/A 07/02/2019   Procedure: APPLICATION OF CRANIAL NAVIGATION;  Surgeon: Jadene Pierini, MD;  Location: [facility] OR;  Service: Neurosurgery;  Laterality: N/A;   CRANIOTOMY N/A 07/03/2019   Procedure: CRANIOTOMY HEMATOMA EVACUATION SUBDURAL;  Surgeon: Jadene Pierini, MD;  Location: [facility] OR;  Service: Neurosurgery;  Laterality: N/A;   CRANIOTOMY Right 07/03/2019   Procedure: CRANIOTOMY HEMATOMA EVACUATION SUBDURAL;  Surgeon: Jadene Pierini, MD;  Location: [facility] OR;  Service: Neurosurgery;  Laterality: Right;   CRANIOTOMY Right 07/02/2019   Procedure: CRANIOTOMY TUMOR EXCISION with Normajean Glasgow;  Surgeon: Jadene Pierini, MD;  Location: [facility] OR;  Service: Neurosurgery;  Laterality: Right;  posterior   Patient Active Problem List   Diagnosis Date Noted   Nontraumatic subdural hemorrhage, unspecified (HCC) 11/07/2022   Acute respiratory failure with hypoxia (HCC) 11/01/2022   AKI (acute kidney injury) (HCC) 11/01/2022   Headache 11/30/2021   Status epilepticus (HCC) 05/22/2021   Alcohol abuse 01/11/2020   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HCC) 01/07/2020   Intentional overdose of drug in tablet form (HCC) 01/05/2020   Drug overdose, multiple drugs, intentional self-harm, initial encounter (HCC) 01/04/2020   Schizoaffective disorder,  depressive type (HCC) 01/01/2020   Generalized anxiety disorder 01/01/2020   Severe episode of recurrent major depressive disorder, with psychotic features (HCC) 01/01/2020   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)    Focal seizures (HCC) 11/07/2019   Auditory hallucinations 07/30/2019   Goals of care, counseling/discussion 07/19/2019   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HCC) 07/01/2019   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/01/2019   Brain mass 06/30/2019   Attention deficit hyperactivity disorder (ADHD), combined type 03/23/2019   Mild episode of recurrent major depressive disorder (HCC) 03/23/2019   Tobacco abuse 10/04/2012   Allergic rhinitis 08/18/2012    ONSET DATE: 11/10/2022- referral date; 11/01/22 hospitalization   REFERRING DIAG: G40.901 (ICD-10-CM) - Status epilepticus (HCC) C71.4 (ICD-10-CM) - 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HCC) I62.00 (ICD-10-CM) - Nontraumatic subdural hemorrhage, unspecified (HCC)  THERAPY DIAG:  Cognitive communication deficit  Rationale for Evaluation and Treatment: Rehabilitation  SUBJECTIVE:   SUBJECTIVE STATEMENT: "I have trouble keeping up - I need extra time" Pt accompanied by: self  PERTINENT HISTORY: 27 year old male with past medical history as below, which is significant for anaplastic oligodendroglioma s/p resection and chemotherapy. He has had seizures since that time which have not been managed with lamotrigine. In the evening hours of 11/01/22 he suffered a seizure at home which went on for 15 minutes before the arrival of EMS. He was treated with benzodiazepines by EMS without improvement. He continued to seize upon arrival to the emergency department he was treated with additional benzodiazepines and IV Keppra bolus. Duration of seizure activity estimated at 30 minutes. Seizures did then eventually break,  but the patient was unresponsive and desaturating requiring intubation in the  emergency department. Pt was intubated 8/19-8/23. EEG suggestive of epileptogenicity and cortical dysfunction in right parieto- occipital region likely secondary to underlying structural abnormality.  PAIN:  Are you having pain? No  FALLS: Has patient fallen in last 6 months?  See PT evaluation for details  LIVING ENVIRONMENT: Lives with: lives with their family Lives in: House/apartment  PLOF:  Level of assistance: Independent with ADLs, Needed assistance with IADLS Employment: On disability  PATIENT GOALS: "To get my life back, have my own place, go back to work"  OBJECTIVE:    COGNITION: Overall cognitive status: Impaired Areas of impairment:  Attention: Impaired: Selective, Alternating Memory: Impaired: Working Teacher, music term Scientist, research (physical sciences) function: Impaired: Problem solving, Organization, Error awareness, Self-correction, and Slow processing Functional deficits:   COGNITIVE COMMUNICATION: Following directions: Follows multi-step commands inconsistently  Auditory comprehension: Impaired: due to slow processing and attention Verbal expression: WFL Functional communication: WFL  ORAL MOTOR EXAMINATION: Overall status: WFL Comments:   STANDARDIZED ASSESSMENTS: CLQT: Attention: Mild, Memory: Mild, Executive Function: WNL, Language: WNL, Visuospatial Skills: Mild, and Clock Drawing: Mild  PATIENT REPORTED OUTCOME MEASURES (PROM): Cognitive function: Short Form: 16/40 - Oley rated 1 or very often/several times a day for slow thinking, working really hard to pay attention, and trouble concentrating. He rated often or a lot to having reading something several times to understand, reading and following complex directions, learning new tasks or instructions.    TODAY'S TREATMENT:                                                                                                                                         DATE:   11/16/22 Eval day: Educated pt re results  of CLQT, reviewed goals and tx using compensatory strategies   PATIENT EDUCATION: Education details: See Today's Treatment and Patient Instructions; compensatory strategies for cognitive impairment Person educated: Patient Education method: Explanation, Demonstration, and Verbal cues Education comprehension: verbal cues required and needs further education   GOALS: Goals reviewed with patient? Yes  SHORT TERM GOALS: Target date: 12/14/22  Pt will carryover 2 compensatory strategies to support auditory processing in conversation and for multistep instructions with occasional min A Baseline: Goal status: INITIAL  2.  Pt will carryover 2 compensations for attention/memory in the kitchen with occasional min A Baseline:  Goal status: INITIAL  3.  Pt will use external memory aids/notebook or calendar to recall and complete 3 house hold or community tasks a day Baseline:  Goal status: INITIAL  4.  Pt will carryover 2 compensatory strategies to support processing of his thoughts during conversation to maintain train of thought with occasional min A Baseline:  Goal status: INITIAL   LONG TERM GOALS: Target date: 01/11/23  Pt will carryover 3 compensations, including environmental modifications to support processing verbal information from family, health care team and  friends with rare min A Baseline:  Goal status: INITIAL  2.  Pt will use external memory aids to recall schedule, to do, appointments and errands with mod I over 1 week Baseline:  Goal status: INITIAL  3.  Pt will carryover 3 compensatory strategies to maintain train of thought and communication during conversations with mod I Baseline:  Goal status: INITIAL  4.  Pt will improve score on Cogntive Function Short Form by 2 points Baseline: 16 Goal status: INITIAL   ASSESSMENT:  CLINICAL IMPRESSION: Patient is a 27 y.o. male who was seen today for mild cognitive communication impairments. He reports difficulty  following conversations, maintaining his train of thought, "registering" information and understanding verbal instructions. He also reports slow reading with reduced processing. He is managing his meds, finances, light cooking and laundry with success. He would like to return to living independently and return to work as able. I recommend skilled ST to maximize cognitive communication skills for safety, independence, QOL.   OBJECTIVE IMPAIRMENTS: include attention, memory, awareness, executive functioning, and receptive language. These impairments are limiting patient from return to work, household responsibilities, and ADLs/IADLs. Factors affecting potential to achieve goals and functional outcome are medical prognosis.. Patient will benefit from skilled SLP services to address above impairments and improve overall function.  REHAB POTENTIAL: Good  PLAN:  SLP FREQUENCY: 2x/week  SLP DURATION: 8 weeks  PLANNED INTERVENTIONS: Language facilitation, Environmental controls, Cueing hierachy, Cognitive reorganization, Internal/external aids, Functional tasks, Multimodal communication approach, SLP instruction and feedback, Compensatory strategies, and Patient/family education    Dara Hoyer, CCC-SLP 11/21/2022, 8:45 AM

## 2022-11-16 NOTE — Therapy (Deleted)
op

## 2022-11-16 NOTE — Therapy (Signed)
OUTPATIENT PHYSICAL THERAPY NEURO EVALUATION   Patient Name: Stephen Beard MRN: [id number] DOB:1995-09-16, 27 y.o., male Today's Date: 11/16/2022   PCP: Morrell Riddle, PA-C   REFERRING PROVIDER:  Jacquelynn Cree, PA-C  END OF SESSION:  PT End of Session - 11/16/22 1409     Visit Number 1    Number of Visits 7    Date for PT Re-Evaluation 01/15/23    PT Start Time 1408   hand off from speech eval   PT Stop Time 1445    PT Time Calculation (min) 37 min    Equipment Utilized During Treatment Gait belt    Activity Tolerance Patient tolerated treatment well    Behavior During Therapy WFL for tasks assessed/performed             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HCC)    GAD (generalized anxiety disorder)    Headache    Intentional overdose (HCC) 12/2019   Psychotic disorder (HCC)    Schizoaffective disorder (HCC)    Seizures (HCC)    Substance abuse (HCC)    Suicidal ideation    Past Surgical History:  Procedure Laterality Date   APPLICATION OF CRANIAL NAVIGATION N/A 07/02/2019   Procedure: APPLICATION OF CRANIAL NAVIGATION;  Surgeon: Jadene Pierini, MD;  Location: [facility];  Service: Neurosurgery;  Laterality: N/A;   CRANIOTOMY N/A 07/03/2019   Procedure: CRANIOTOMY HEMATOMA EVACUATION SUBDURAL;  Surgeon: Jadene Pierini, MD;  Location: [facility];  Service: Neurosurgery;  Laterality: N/A;   CRANIOTOMY Right 07/03/2019   Procedure: CRANIOTOMY HEMATOMA EVACUATION SUBDURAL;  Surgeon: Jadene Pierini, MD;  Location: [facility];  Service: Neurosurgery;  Laterality: Right;   CRANIOTOMY Right 07/02/2019   Procedure: CRANIOTOMY TUMOR EXCISION with Normajean Glasgow;  Surgeon: Jadene Pierini, MD;  Location: [facility];  Service: Neurosurgery;  Laterality: Right;  posterior   Patient Active Problem List   Diagnosis Date Noted   Nontraumatic subdural hemorrhage, unspecified (HCC) 11/07/2022   Acute  respiratory failure with hypoxia (HCC) 11/01/2022   AKI (acute kidney injury) (HCC) 11/01/2022   Headache 11/30/2021   Status epilepticus (HCC) 05/22/2021   Alcohol abuse 01/11/2020   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HCC) 01/07/2020   Intentional overdose of drug in tablet form (HCC) 01/05/2020   Drug overdose, multiple drugs, intentional self-harm, initial encounter (HCC) 01/04/2020   Schizoaffective disorder, depressive type (HCC) 01/01/2020   Generalized anxiety disorder 01/01/2020   Severe episode of recurrent major depressive disorder, with psychotic features (HCC) 01/01/2020   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)    Focal seizures (HCC) 11/07/2019   Auditory hallucinations 07/30/2019   Goals of care, counseling/discussion 07/19/2019   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HCC) 07/01/2019   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/01/2019   Brain mass 06/30/2019   Attention deficit hyperactivity disorder (ADHD), combined type 03/23/2019   Mild episode of recurrent major depressive disorder (HCC) 03/23/2019   Tobacco abuse 10/04/2012   Allergic rhinitis 08/18/2012    ONSET DATE: 11/11/2022  REFERRING DIAG: I62.00 (ICD-10-CM) - Nontraumatic subdural hemorrhage, unspecified  THERAPY DIAG:  Unsteadiness on feet  Muscle weakness (generalized)  Rationale for Evaluation and Treatment: Rehabilitation  SUBJECTIVE:  SUBJECTIVE STATEMENT: Pt reports that he does exercises at home - doing squats and side stepping, taking walks with the dog. Notices that his balance is really off. Notes the L leg is weaker. Sometimes will have numbness in L arm. Have fallen several times, but not recently. Last one was several weeks ago, part of it is from his vision and not  seeing something right. Has to go see an ophthalmologist  Pt accompanied by: self  PERTINENT HISTORY: Pt presented on 10/31/22 with seizure >30 mins. EEG consistent with epilepsy and cortical dysfunction arising from R hemisphere, also moderate diffuse encephalopathy (same as EEG on admission a year ago). MRI showed trace SDH along the R parietal convexity. 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 No  PRECAUTIONS: Fall and Other: hx of seizures     WEIGHT BEARING RESTRICTIONS: No  FALLS: Has patient fallen in last 6 months? Yes. Number of falls 3 or 4, did not hurt himself   LIVING ENVIRONMENT: Lives with:  lives with mom  Lives in: House/apartment Stairs: No Has following equipment at home: None  PLOF: Independent with community mobility without device and has not driven since before his brain tumor surgery (2021)  PATIENT GOALS: Wants to have good balance again, wants to get his thighs good   OBJECTIVE:   DIAGNOSTIC FINDINGS: MRI brain 11/01/22 IMPRESSION: 1. Unchanged 6 mm focus of acute hemorrhage along the anterior margin of the right occipital resection cavity. No new or nodular enhancement to suggest local recurrence. 2. Trace subdural hemorrhage along the right parietal convexity.  COGNITION: Overall cognitive status: Within functional limits for tasks assessed and History of cognitive impairments - at baseline   SENSATION: Light touch: WFL and bilat LE  Sometimes will get numbness in L arm   COORDINATION: Nose to finger: Dysmetric LLE Heel to shin: WNL    POSTURE: No Significant postural limitations   LOWER EXTREMITY MMT:    MMT Right Eval Left Eval  Hip flexion 5 5  Hip extension    Hip abduction 4+  4+  Hip adduction 4 4  Hip internal rotation    Hip external rotation    Knee flexion 5 4+  Knee extension 4+ 4+  Ankle dorsiflexion 5 5  Ankle plantarflexion    Ankle inversion    Ankle eversion    (Blank rows = not tested)  All tested in sitting Noted incr tremors with hip ADD/ABD    TRANSFERS: Assistive device utilized: None  Sit to stand: Complete Independence Stand to sit: Complete Independence Noted pt having more weight through R side and RLE staggered posteriorly    STAIRS: Level of Assistance: SBA Stair Negotiation Technique: Alternating Pattern  Forwards with No Rails Single Rail on Right Number of Stairs: 4 x 2 reps = 8 total   Height of Stairs: 6  Comments: Pt slower to descend due to weakness, balance, and visual deficits when not using handrail   GAIT: Gait pattern: decreased arm swing- Left, decreased stride length, lateral hip instability, and decreased trunk rotation Distance walked: Clinic distances  Assistive device utilized: None Level of assistance: Modified independence Comments: No overt unsteadiness noted   FUNCTIONAL TESTS:  5 times sit to stand: 10.8 seconds with no UE support  10 meter walk test: 10.2 seconds = 3.22  ft/sec  SLS: RLE and LLE: 1-2 seconds    [facility] PT Assessment - 11/16/22 1436       Functional Gait  Assessment   Gait assessed  Yes    Gait Level Surface Walks 20 ft in less than 5.5 sec, no assistive devices, good speed, no evidence for imbalance, normal gait pattern, deviates no more than 6 in outside of the 12 in walkway width.    Change in Gait Speed Able to smoothly change walking speed without loss of balance or gait deviation. Deviate no more than 6 in outside of the 12 in walkway width.    Gait with Horizontal Head Turns Performs head turns smoothly with no change in gait. Deviates no more than 6 in outside 12 in walkway width    Gait with Vertical Head Turns Performs head turns with no change in gait. Deviates no more  than 6 in outside 12 in walkway width.    Gait and Pivot Turn Pivot turns safely within 3 sec and stops quickly with no loss of balance.    Step Over Obstacle Is able to step over 2 stacked shoe boxes taped together (9 in total height) without changing gait speed. No evidence of imbalance.    Gait with Narrow Base of Support Ambulates 7-9 steps.    Gait with Eyes Closed Walks 20 ft, uses assistive device, slower speed, mild gait deviations, deviates 6-10 in outside 12 in walkway width. Ambulates 20 ft in less than 9 sec but greater than 7 sec.   veering to R   Ambulating Backwards Walks 20 ft, uses assistive device, slower speed, mild gait deviations, deviates 6-10 in outside 12 in walkway width.    Steps Alternating feet, must use rail.    Total Score 26    FGA comment: 26/30 = Low Fall Risk              M-CTSIB  Condition 1: Firm Surface, EO 30 Sec, Normal Sway  Condition 2: Firm Surface, EC 30 Sec, Normal Sway  Condition 3: Foam Surface, EO 30 Sec, Mild Sway  Condition 4: Foam Surface, EC 13 Sec       TODAY'S TREATMENT:                                                                                                                              N/A during eval.   PATIENT EDUCATION: Education details: Clinical findings, POC  Person educated: Patient Education method: Explanation Education comprehension: verbalized understanding  HOME EXERCISE PROGRAM: Will provide at future session   GOALS: Goals reviewed with patient? Yes  SHORT TERM GOALS: ALL STGS = LTGS  LONG TERM GOALS: Target date: 12/14/2022  Pt will be independent with final HEP for strength/balance/gait in order to build upon functional gains made in therapy. Baseline:  Goal status: INITIAL  2.  Pt will improve FGA to at least a 29/30 in order to demo decr fall risk. Baseline:  26/30 Goal status: INITIAL  3.  Pt will perform 8 steps with no handrail and reciprocal pattern with mod I in order to demo improved  balance.  Baseline:  Goal status: INITIAL  4.  Pt will improve condition 4 of mCTSIB to at least 25 seconds to demo improved vestibular input for balance.  Baseline: 13 seconds  Goal status: INITIAL  5.  Pt will improve gait speed with no to at least 3.5 ft/sec in order to demo improved community mobility.   Baseline: 3.22 ft/sec Goal status: INITIAL   ASSESSMENT:  CLINICAL IMPRESSION: Patient is a 27 year old male referred to Neuro [facility] for SDH.   Pt hospitalized on 10/31/22 with seizure >30 mins. EEG consistent with epilepsy and cortical dysfunction arising from R hemisphere, also moderate diffuse encephalopathy (same as EEG on admission a year ago). MRI showed trace SDH along the R parietal convexity. 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 The following deficits were present during the exam: decr strength, gait abnormalities, impaired coordination, impaired balance. Based on FGA, pt is a mild for falls. Based on condition 4 of mCTSIB, pt with decr vestibular input for balance. Pt only able to hold SLS bilat for 1-2 seconds. Pt would benefit from skilled PT to address these impairments and functional limitations to maximize functional mobility independence and decr fall risk.    OBJECTIVE IMPAIRMENTS: Abnormal gait, decreased activity tolerance, decreased balance, decreased cognition, decreased coordination, decreased strength, and impaired sensation.   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   REHAB POTENTIAL: Good  CLINICAL DECISION MAKING: Evolving/moderate complexity  EVALUATION COMPLEXITY: Moderate  PLAN:  PT FREQUENCY: 1-2x/week  PT DURATION: 8 weeks - due to delay in scheduling   PLANNED INTERVENTIONS: Therapeutic exercises, Therapeutic activity, Neuromuscular re-education, Balance training, Gait training, Patient/Family education, Self Care, Joint mobilization, Stair training, Vestibular training, Manual therapy, and Re-evaluation  PLAN FOR NEXT SESSION: Initiate HEP for LLE>RLE strength, balance with EC, compliant surfaces, narrow BOS. Work on high level balance and strengthening exercises. Pt wants to be more steady when going up and down stairs    [employer], PT,DPT 11/16/2022, 3:25 PM

## 2022-11-16 NOTE — Therapy (Unsigned)
OUTPATIENT OCCUPATIONAL THERAPY NEURO EVALUATION  Patient Name: Stephen Beard MRN: [id number] DOB:[birth year], 27 y.o., male Today's Date: 11/16/2022  PCP: Morrell Riddle, PA-C REFERRING PROVIDER: Jacquelynn Cree, PA-C  END OF SESSION:  OT End of Session - 11/16/22 1106     Visit Number 1    Number of Visits 10    Authorization Type MEDICARE PART A AND B    OT Start Time 1106    OT Stop Time 1151    OT Time Calculation (min) 45 min    Equipment Utilized During Treatment Testing Material    Activity Tolerance Patient tolerated treatment well    Behavior During Therapy WFL for tasks assessed/performed             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HCC)    GAD (generalized anxiety disorder)    Headache    Intentional overdose (HCC) 12/2019   Psychotic disorder (HCC)    Schizoaffective disorder (HCC)    Seizures (HCC)    Substance abuse (HCC)    Suicidal ideation    Past Surgical History:  Procedure Laterality Date   APPLICATION OF CRANIAL NAVIGATION N/A 07/02/2019   Procedure: APPLICATION OF CRANIAL NAVIGATION;  Surgeon: Jadene Pierini, MD;  Location: [facility] OR;  Service: Neurosurgery;  Laterality: N/A;   CRANIOTOMY N/A 07/03/2019   Procedure: CRANIOTOMY HEMATOMA EVACUATION SUBDURAL;  Surgeon: Jadene Pierini, MD;  Location: [facility] OR;  Service: Neurosurgery;  Laterality: N/A;   CRANIOTOMY Right 07/03/2019   Procedure: CRANIOTOMY HEMATOMA EVACUATION SUBDURAL;  Surgeon: Jadene Pierini, MD;  Location: [facility] OR;  Service: Neurosurgery;  Laterality: Right;   CRANIOTOMY Right 07/02/2019   Procedure: CRANIOTOMY TUMOR EXCISION with Normajean Glasgow;  Surgeon: Jadene Pierini, MD;  Location: [facility] OR;  Service: Neurosurgery;  Laterality: Right;  posterior   Patient Active Problem List   Diagnosis Date Noted   Nontraumatic subdural hemorrhage, unspecified (HCC) 11/07/2022   Acute respiratory failure  with hypoxia (HCC) 11/01/2022   AKI (acute kidney injury) (HCC) 11/01/2022   Headache 11/30/2021   Status epilepticus (HCC) 05/22/2021   Alcohol abuse 01/11/2020   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HCC) 01/07/2020   Intentional overdose of drug in tablet form (HCC) 01/05/2020   Drug overdose, multiple drugs, intentional self-harm, initial encounter (HCC) 01/04/2020   Schizoaffective disorder, depressive type (HCC) 01/01/2020   Generalized anxiety disorder 01/01/2020   Severe episode of recurrent major depressive disorder, with psychotic features (HCC) 01/01/2020   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)    Focal seizures (HCC) 11/07/2019   Auditory hallucinations 07/30/2019   Goals of care, counseling/discussion 07/19/2019   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HCC) 07/01/2019   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/01/2019   Brain mass 06/30/2019   Attention deficit hyperactivity disorder (ADHD), combined type 03/23/2019   Mild episode of recurrent major depressive disorder (HCC) 03/23/2019   Tobacco abuse 10/04/2012   Allergic rhinitis 08/18/2012    ONSET DATE: 11/15/22  REFERRING DIAG: I62.00 (ICD-10-CM) - Nontraumatic subdural hemorrhage, unspecified Patient drinks but no drugs (cold beer)  THERAPY DIAG:  Other lack of coordination  Muscle weakness (generalized)  Frontal lobe and executive function deficit  Other symptoms and signs involving the nervous system  Rationale for Evaluation and Treatment: Rehabilitation  SUBJECTIVE:   SUBJECTIVE STATEMENT: Patient has been doing exercises at home but he things don't process things liek  they used to. Pt accompanied by: self  PERTINENT HISTORY: ***  PRECAUTIONS: None  WEIGHT BEARING RESTRICTIONS: No  PAIN:  Are you having pain? No  FALLS: Has patient fallen in last 6 months? Yes. Number of falls maybe 1 due to vision  LIVING ENVIRONMENT: Lives with: lives with their  family Lives in: House/apartment Stairs: No Has following equipment at home: None  PLOF: Independent - worked at [employer]  PATIENT GOALS: cognitive ie) understanding directions; vision; balance  OBJECTIVE:   HAND DOMINANCE: Right  ADLs: Overall ADLs: Independent Transfers/ambulation related to ADLs: Eating: *** Grooming: *** UB Dressing: *** LB Dressing: *** Toileting: *** Bathing: *** Tub Shower transfers: *** Equipment: {equipment:25573}  IADLs: No driving due to seizures and vision Shopping: Accompanies parent (mom & step dad) Light housekeeping: Helps do his own laundry, cooks a little no air riyer Meal Prep: *** Community mobility: [place] Medication management: Take his  Financial management: *** Handwriting: {OTWRITTENEXPRESSION:25361}  MOBILITY STATUS: {OTMOBILITY:25360}  POSTURE COMMENTS:  {posture:25561} Sitting balance: {sitting balance:25483}  ACTIVITY TOLERANCE: Activity tolerance: ***  FUNCTIONAL OUTCOME MEASURES: {OTFUNCTIONALMEASURES:27238}  UPPER EXTREMITY ROM:    {AROM/PROM:27142} ROM Right eval Left eval  Shoulder flexion    Shoulder abduction    Shoulder adduction    Shoulder extension    Shoulder internal rotation    Shoulder external rotation    Elbow flexion    Elbow extension    Wrist flexion    Wrist extension    Wrist ulnar deviation    Wrist radial deviation    Wrist pronation    Wrist supination    (Blank rows = not tested)  UPPER EXTREMITY MMT:     MMT Right eval Left eval  Shoulder flexion    Shoulder abduction    Shoulder adduction    Shoulder extension    Shoulder internal rotation    Shoulder external rotation    Middle trapezius    Lower trapezius    Elbow flexion    Elbow extension    Wrist flexion    Wrist extension    Wrist ulnar deviation    Wrist radial deviation    Wrist pronation    Wrist supination    (Blank rows = not tested)  HAND FUNCTION: Grip strength: Right: 59.3,  55.5, 62.3  lbs; Left: 60.8, 56.2, 69.4  lbs  COORDINATION: 9 Hole Peg test: Right: 34.5 sec; Left: 33.7 sec  SENSATION: WFL  EDEMA: NA  MUSCLE TONE: {UETONE:25567}  COGNITION: Overall cognitive status: Within functional limits for tasks assessed  VISION: Subjective report: can see distance better than up close, needs to see an ophthalmologist, L eye is worse than R Baseline vision: Wears glasses all the time Visual history:  impaired since brain tumor  VISION ASSESSMENT: Impaired Tracking/Visual pursuits: Decreased smoothness with horizontal tracking and especially towards the R side 12" convergence Full L visual field cut for approaching objects form L side Patient has difficulty with following activities due to following visual impairments: finding objects from L side  Inferior/superior R side intact, L side absent  PERCEPTION: Not tested  PRAXIS: Not tested  OBSERVATIONS: ***   TODAY'S TREATMENT:  DATE: ***   PATIENT EDUCATION: Education details: *** Person educated: {Person educated:25204} Education method: {Education Method:25205} Education comprehension: {Education Comprehension:25206}  HOME EXERCISE PROGRAM: ***   GOALS: Goals reviewed with patient? {yes/no:20286}  SHORT TERM GOALS: Target date: ***  Patient will demonstrate BUE strength and coordination HEP with 25% verbal cues or less for proper execution.  Baseline: Goal status: INITIAL  2.  Patient will demonstrate vision specific HEP with 25% verbal cues or less for proper execution.  Baseline:  Goal status: INITIAL  3.  *** Baseline:  Goal status: INITIAL  4.  *** Baseline:  Goal status: INITIAL  5.  *** Baseline:  Goal status: INITIAL  6.  *** Baseline:  Goal status: INITIAL  LONG TERM GOALS: Target date: ***  Patient will independently recall at  least 2 compensatory strategies for visual impairment without cueing. Baseline: Goal status: INITIAL  2. Patient will return demonstration of visual adaptation use and verbalize understanding of how to obtain item(s) if desired.  Baseline:  Goal status: INITIAL  3.  *** Baseline:  Goal status: INITIAL  4.  *** Baseline:  Goal status: INITIAL  5.  *** Baseline:  Goal status: INITIAL  6.  *** Baseline:  Goal status: INITIAL  ASSESSMENT:  CLINICAL IMPRESSION: Patient is a *** y.o. *** who was seen today for occupational therapy evaluation for ***.   PERFORMANCE DEFICITS: in functional skills including {OT physical skills:25468}, cognitive skills including {OT cognitive skills:25469}, and psychosocial skills including {OT psychosocial skills:25470}.   IMPAIRMENTS: are limiting patient from {OT performance deficits:25471}.   CO-MORBIDITIES: {Comorbidities:25485} that affects occupational performance. Patient will benefit from skilled OT to address above impairments and improve overall function.  MODIFICATION OR ASSISTANCE TO COMPLETE EVALUATION: {OT modification:25474}  OT OCCUPATIONAL PROFILE AND HISTORY: {OT PROFILE AND HISTORY:25484}  CLINICAL DECISION MAKING: {OT CDM:25475}  REHAB POTENTIAL: {rehabpotential:25112}  EVALUATION COMPLEXITY: {Evaluation complexity:25115}    PLAN:  OT FREQUENCY: {rehab frequency:25116}  OT DURATION: {rehab duration:25117}  PLANNED INTERVENTIONS: {OT Interventions:25467}  RECOMMENDED OTHER SERVICES: ***  CONSULTED AND AGREED WITH PLAN OF CARE: {IHK:74259}  PLAN FOR NEXT SESSION: ***  Assess depth perception  Victorino Sparrow, OT 11/16/2022, 12:20 PM

## 2022-11-17 ENCOUNTER — Inpatient Hospital Stay: Payer: Medicare Other | Admitting: Internal Medicine

## 2022-11-17 ENCOUNTER — Other Ambulatory Visit: Payer: Self-pay

## 2022-11-17 ENCOUNTER — Telehealth: Payer: Self-pay | Admitting: Internal Medicine

## 2022-11-24 ENCOUNTER — Inpatient Hospital Stay: Payer: Medicare Other | Attending: Internal Medicine | Admitting: Internal Medicine

## 2022-11-24 ENCOUNTER — Telehealth: Payer: Self-pay | Admitting: *Deleted

## 2022-11-24 DIAGNOSIS — C714 Malignant neoplasm of occipital lobe: Secondary | ICD-10-CM | POA: Insufficient documentation

## 2022-11-24 DIAGNOSIS — G40909 Epilepsy, unspecified, not intractable, without status epilepticus: Secondary | ICD-10-CM | POA: Insufficient documentation

## 2022-11-24 NOTE — Telephone Encounter (Signed)
PC to patient regarding missed appointment this a.m., no answer, left VM - instructed patient to call our scheduling department to reschedule, [phone].  Scheduling message also sent requesting that patient be contacted.

## 2022-11-28 ENCOUNTER — Ambulatory Visit: Payer: Medicare Other | Admitting: Occupational Therapy

## 2022-11-28 ENCOUNTER — Inpatient Hospital Stay (HOSPITAL_BASED_OUTPATIENT_CLINIC_OR_DEPARTMENT_OTHER): Payer: Medicare Other | Admitting: Internal Medicine

## 2022-11-28 ENCOUNTER — Encounter: Payer: Self-pay | Admitting: Internal Medicine

## 2022-11-28 ENCOUNTER — Ambulatory Visit: Payer: Medicare Other | Admitting: Physical Therapy

## 2022-11-28 VITALS — BP 138/91 | HR 74 | Temp 98.6°F | Resp 20 | Wt 133.0 lb

## 2022-11-28 DIAGNOSIS — C714 Malignant neoplasm of occipital lobe: Secondary | ICD-10-CM | POA: Diagnosis not present

## 2022-11-28 DIAGNOSIS — G40909 Epilepsy, unspecified, not intractable, without status epilepticus: Secondary | ICD-10-CM | POA: Diagnosis not present

## 2022-11-28 DIAGNOSIS — G40901 Epilepsy, unspecified, not intractable, with status epilepticus: Secondary | ICD-10-CM

## 2022-11-28 DIAGNOSIS — R569 Unspecified convulsions: Secondary | ICD-10-CM | POA: Diagnosis not present

## 2022-11-28 MED ORDER — LAMOTRIGINE 100 MG PO TABS: ORAL_TABLET | ORAL | 5 refills | Status: DC

## 2022-11-28 MED ORDER — LAMOTRIGINE 150 MG PO TABS: ORAL_TABLET | ORAL | 5 refills | Status: DC

## 2022-11-28 NOTE — Progress Notes (Signed)
[facility] at [facility] [address]  [place], [place] [address] [phone]   Interval Evaluation  Date of Service: 11/28/22 Patient Name: STEFAN Beard Patient MRN: [id number] Patient DOB: 16-Sep-1995 Provider: Henreitta Leber, MD  Identifying Statement:  Stephen Beard is a 27 y.o. male with right parietal  anaplastic oligodendoglioma    Oncologic History: Oncology History  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HCC)  07/02/2019 Surgery   Craniotomy, resection by Dr. Maurice Small. Followed by hematoma evacuation on 07/03/19.   08/05/2019 - 09/17/2019 Radiation Therapy   IMRT with concurrent Temodar 75mg /m2 daily   10/15/2019 - 11/26/2020 Chemotherapy   Completes 6 cycles of adjuvant 5-day Temodar, with embedded treatment delays     Biomarkers:  MGMT Unknown.  IDH 1/2 Mutated.  EGFR Unknown  1p/19q co-deleted   Interval History:  Hamza Saldanha Rembold presents today for follow up after recent hospitalization, MRI brain.  He denies any seizures since discharge.  Remains on Lamictal 250mg  BID with nayzilam  rescue.  Otherwise denies new or progressive deficits.  Occassional tension headaches only.    H+P (07/19/19) Patient presented to medical attention with several months of progressive headaches and left sided visual impairment.  This was accompanied by episodes of deja-vu +paroxysmal burning smells which would occur almost daily over that time.  CNS imaging demonstrated large parieto-occipital mass with herniation syndrome.  Mass was resected/debulked, and post-operative course was complicated by intracranial hematoma and also respiratory failure.  At present, he is walking and talking, carries left sided visual impairment.  Currently on antibiotics for wound infection per Dr. Maurice Small.  Not requiring steroids.  Currently on lamictal 50mg  daily.  Medications: Current Outpatient Medications on File Prior to Visit  Medication Sig Dispense Refill    acetaminophen (TYLENOL) 325 MG tablet Take 1-2 tablets (325-650 mg total) by mouth every 4 (four) hours as needed for mild pain.     benzocaine (ORAJEL) 10 % mucosal gel Use as directed in the mouth or throat 4 (four) times daily as needed for mouth pain. (Patient not taking: Reported on 11/16/2022)     diazePAM, 15 MG Dose, (VALTOCO 15 MG DOSE) 2 x 7.5 MG/0.1ML LQPK Place 1 puff into the nose as needed (for seizure lasting longer than 2 minutes). 2 each 1   lamoTRIgine (LAMICTAL) 100 MG tablet TAKE 1 TABLET BY MOUTH TWICE DAILY.TAKE ALONG WITH ONE 150 MG TABLET (250 MG TWICE DAILY TOTAL) (Patient not taking: Reported on 11/16/2022) 60 tablet 0   lamoTRIgine (LAMICTAL) 100 MG tablet Take 1 tablet (100 mg total) by mouth 2 (two) times daily. To be taken with 150 mg tablets for a total of 250 mg twice daily. 60 tablet 0   lamoTRIgine (LAMICTAL) 150 MG tablet TAKE 1 TABLET BY MOUTH TWICE DAILY.TAKE WITH 100 MG TABLET.(250 MG TWICE DAILY TOTAL). (Patient taking differently: Take 150 mg by mouth 2 (two) times daily.) 60 tablet 0   loratadine (CLARITIN) 10 MG tablet Take 1 tablet (10 mg total) by mouth daily.     melatonin 3 MG TABS tablet Take 1 tablet (3 mg total) by mouth at bedtime as needed.     No current facility-administered medications on file prior to visit.    Allergies:  Allergies  Allergen Reactions   Prozac [Fluoxetine]     Intolerable nausea/2015 office notes   Past Medical History:  Past Medical History:  Diagnosis Date   ADD (attention deficit disorder)    ADHD  Bifrontal oligodendroglioma (HCC)    GAD (generalized anxiety disorder)    Headache    Intentional overdose (HCC) 12/2019   Psychotic disorder (HCC)    Schizoaffective disorder (HCC)    Seizures (HCC)    Substance abuse (HCC)    Suicidal ideation    Past Surgical History:  Past Surgical History:  Procedure Laterality Date   APPLICATION OF CRANIAL NAVIGATION N/A 07/02/2019   Procedure: APPLICATION OF CRANIAL  NAVIGATION;  Surgeon: Jadene Pierini, MD;  Location: [facility] OR;  Service: Neurosurgery;  Laterality: N/A;   CRANIOTOMY N/A 07/03/2019   Procedure: CRANIOTOMY HEMATOMA EVACUATION SUBDURAL;  Surgeon: Jadene Pierini, MD;  Location: [facility] OR;  Service: Neurosurgery;  Laterality: N/A;   CRANIOTOMY Right 07/03/2019   Procedure: CRANIOTOMY HEMATOMA EVACUATION SUBDURAL;  Surgeon: Jadene Pierini, MD;  Location: [facility] OR;  Service: Neurosurgery;  Laterality: Right;   CRANIOTOMY Right 07/02/2019   Procedure: CRANIOTOMY TUMOR EXCISION with Normajean Glasgow;  Surgeon: Jadene Pierini, MD;  Location: [facility] OR;  Service: Neurosurgery;  Laterality: Right;  posterior   Social History:  Social History   Socioeconomic History   Marital status: Single    Spouse name: Not on file   Number of children: Not on file   Years of education: Not on file   Highest education level: Not on file  Occupational History   Not on file  Tobacco Use   Smoking status: Every Day    Current packs/day: 1.00    Average packs/day: 1 pack/day for 12.0 years (12.0 ttl pk-yrs)    Types: Cigarettes   Smokeless tobacco: Current    Types: Chew  Vaping Use   Vaping status: Never Used  Substance and Sexual Activity   Alcohol use: Yes    Comment: last drink: "3 nights ago" when he had "1/4th of a bottle," has been drinking daily for several weeks from a "few beers to a bottle of liquor"   Drug use: Yes    Types: Cocaine    Comment: last use "couple of weeks ago" when he had "2 lines,"  frequency "once in a blue moon"   Sexual activity: Not Currently  Other Topics Concern   Not on file  Social History Narrative   Not on file   Social Determinants of Health   Financial Resource Strain: Medium Risk (01/07/2022)   Overall Financial Resource Strain (CARDIA)    Difficulty of Paying Living Expenses: Somewhat hard  Food Insecurity: No Food Insecurity (11/05/2022)   Hunger Vital Sign    Worried About Running Out of Food in the Last  Year: Never true    Ran Out of Food in the Last Year: Never true  Transportation Needs: No Transportation Needs (11/05/2022)   PRAPARE - Administrator, Civil Service (Medical): No    Lack of Transportation (Non-Medical): No  Physical Activity: Not on file  Stress: Not on file  Social Connections: Moderately Integrated (01/07/2022)   Social Connection and Isolation Panel [NHANES]    Frequency of Communication with Friends and Family: More than three times a week    Frequency of Social Gatherings with Friends and Family: More than three times a week    Attends Religious Services: More than 4 times per year    Active Member of [employer] or Organizations: Yes    Attends Banker Meetings: More than 4 times per year    Marital Status: Never married  Intimate Partner Violence: Not At Risk (11/05/2022)   Humiliation, Afraid,  Rape, and Kick questionnaire    Fear of Current or Ex-Partner: No    Emotionally Abused: No    Physically Abused: No    Sexually Abused: No   Family History: No family history on file.  Review of Systems: Constitutional: Doesn't report fevers, chills or abnormal weight loss Eyes: Doesn't report blurriness of vision Ears, nose, mouth, throat, and face: Doesn't report sore throat Respiratory: Doesn't report cough, dyspnea or wheezes Cardiovascular: Doesn't report palpitation, chest discomfort  Gastrointestinal:  Doesn't report nausea, constipation, diarrhea GU: Doesn't report incontinence Skin: Doesn't report skin rashes Neurological: Per HPI Musculoskeletal: Doesn't report joint pain Behavioral/Psych: Doesn't report anxiety  Physical Exam: Vitals:   11/28/22 1120  BP: (!) 138/91  Pulse: 74  Resp: 20  Temp: 98.6 F (37 C)  SpO2: 100%    KPS: 80. General: Alert, cooperative, pleasant, in no acute distress Head: Normal EENT: No conjunctival injection or scleral icterus.  Lungs: Resp effort normal Cardiac: Regular rate Abdomen:  Non-distended abdomen Skin: No rashes cyanosis or petechiae. Extremities: No clubbing or edema  Neurologic Exam: Mental Status: Awake, alert, attentive to examiner. Oriented to self and environment. Language is fluent with intact comprehension.  Cranial Nerves: Visual acuity is grossly normal. Left homonymous hemianopia. Extra-ocular movements intact. No ptosis. Face is symmetric Motor: Tone and bulk are normal. Power is full in both arms and legs. Reflexes are symmetric, no pathologic reflexes present.  Sensory: Intact to light touch Gait: Sensory dystaxia   Labs: I have reviewed the data as listed    Component Value Date/Time   NA 137 11/10/2022 0610   K 4.4 11/10/2022 0610   CL 100 11/10/2022 0610   CO2 28 11/10/2022 0610   GLUCOSE 104 (H) 11/10/2022 0610   BUN 14 11/10/2022 0610   CREATININE 0.81 11/10/2022 0610   CREATININE 0.95 05/17/2021 1021   CALCIUM 9.6 11/10/2022 0610   PROT 7.2 11/08/2022 0803   ALBUMIN 3.9 11/08/2022 0803   AST 72 (H) 11/08/2022 0803   AST 37 05/17/2021 1021   ALT 102 (H) 11/08/2022 0803   ALT 48 (H) 05/17/2021 1021   ALKPHOS 66 11/08/2022 0803   BILITOT 0.7 11/08/2022 0803   BILITOT 1.1 05/17/2021 1021   GFRNONAA >60 11/10/2022 0610   GFRNONAA >60 05/17/2021 1021   GFRAA >60 12/05/2019 1206   Lab Results  Component Value Date   WBC 6.0 11/10/2022   NEUTROABS 2.8 11/08/2022   HGB 15.4 11/10/2022   HCT 44.6 11/10/2022   MCV 96.3 11/10/2022   PLT 346 11/10/2022   Imaging:  [facility] Clinician Interpretation: I have personally reviewed the CNS images as listed.  My interpretation, in the context of the patient's clinical presentation, is stable disease  DG Abd Portable 1V  Result Date: 11/04/2022 CLINICAL DATA:  Encounter for feeding tube placement. EXAM: PORTABLE ABDOMEN - 1 VIEW COMPARISON:  Upper abdominal radiograph 11/01/2022 FINDINGS: Interval removal of the prior nasogastric tube and interval placement of weighted tip enteric tube. The  tip overlies the distal gastric antrum/pylorus. Nonobstructive bowel-gas pattern. IMPRESSION: Weighted tip enteric tube tip overlies the distal gastric antrum/pylorus. Electronically Signed   By: Neita Garnet M.D.   On: 11/04/2022 13:36   DG Abd Portable 1V  Result Date: 11/01/2022 CLINICAL DATA:  Nasogastric tube placement.  OG tube placement. EXAM: PORTABLE ABDOMEN - 1 VIEW COMPARISON:  None Available. FINDINGS: Tip and side port of the enteric tube below the diaphragm in the stomach. Nonobstructive upper abdominal bowel gas pattern. IMPRESSION:  Tip and side port of the enteric tube below the diaphragm in the stomach. Electronically Signed   By: Narda Rutherford M.D.   On: 11/01/2022 16:49   MR Brain W and Wo Contrast  Result Date: 11/01/2022 CLINICAL DATA:  Seizure, new-onset, no history of trauma Status epilepticus, status post brain tumor resection, hemorrhage versus mass on CT scan. EXAM: MRI HEAD WITHOUT AND WITH CONTRAST TECHNIQUE: Multiplanar, multiecho pulse sequences of the brain and surrounding structures were obtained without and with intravenous contrast. CONTRAST:  7mL GADAVIST GADOBUTROL 1 MMOL/ML IV SOLN COMPARISON:  Brain MRI 04/19/2022.  Head CT 10/31/2022. FINDINGS: Brain: Unchanged 6 mm focus of acute hemorrhage along the anterior margin of the right occipital resection cavity. No new or nodular enhancement to suggest local recurrence. Unchanged surrounding T2 hyperintensity. Trace subdural hemorrhage along the right parietal convexity (axial image 27 series 6). No hydrocephalus, mass effect or midline shift. Vascular: Normal flow voids and vessel enhancement. Skull and upper cervical spine: Normal marrow signal and enhancement. Sinuses/Orbits: No acute findings. Other: None. IMPRESSION: 1. Unchanged 6 mm focus of acute hemorrhage along the anterior margin of the right occipital resection cavity. No new or nodular enhancement to suggest local recurrence. 2. Trace subdural hemorrhage  along the right parietal convexity. Electronically Signed   By: Orvan Falconer M.D.   On: 11/01/2022 15:54   Overnight EEG with video  Result Date: 11/01/2022 Charlsie Quest, MD     11/02/2022  9:33 AM Patient Name: DANTRELL VANROSSUM MRN: [id number] Epilepsy Attending: Charlsie Quest Referring Physician/Provider: Milon Dikes, MD Duration: 10/31/2022 2303 to 11/01/2022 2303 Patient history: 27 year old with past medical history of anaplastic oligodendroglioma in the right parieto-occipital region status post resection/debulking complicated by intracerebral hematoma, status post chemotherapy, history of seizures on lamotrigine now with breakthrough seizure lasting 30 to 40 minutes with concern for status epilepticus. EEG to evaluate for seizure Level of alertness:  comatose-->awake, asleep AEDs during EEG study: LEV, LTG, propofol Technical aspects: This EEG study was done with scalp electrodes positioned according to the 10-20 International system of electrode placement. Electrical activity was reviewed with band pass filter of 1-70Hz , sensitivity of 7 uV/mm, display speed of 41mm/sec with a 60Hz  notched filter applied as appropriate. EEG data were recorded continuously and digitally stored.  Video monitoring was available and reviewed as appropriate. Description: At the study, EEG showed continuous generalized 3 to 5 Hz 11 delta slowing with overriding 12 to 15 Hz beta activity.  Gradually as sedation was adjusted, EEG showed posterior dominant rhythm of 8-9 Hz activity of moderate voltage (25-35 uV) seen predominantly in posterior head regions, symmetric and reactive to eye opening and eye closing. Sleep was characterized by vertex waves, sleep spindles (12 to 14 Hz), maximal frontocentral region. Continuous 3 to 5 Hz theta-delta slowing was also noted in right parieto-occipital region. Hyperventilation and photic stimulation were not performed.   ABNORMALITY - Continuous slow, generalized and maximal  right parieto-occipital region IMPRESSION: This study was initially suggestive of severe diffuse encephalopathy likely related to sedation.  Gradually sedation was adjusted, EEG improved.  Subsequently EEG was suggestive of cortical dysfunction in right parietal occipital region likely secondary to underlying structural abnormality.  No seizures or definite epileptiform discharges were seen during the study. Charlsie Quest   CT Head Wo Contrast  Result Date: 10/31/2022 CLINICAL DATA:  Seizure, history of brain tumor EXAM: CT HEAD WITHOUT CONTRAST TECHNIQUE: Contiguous axial images were obtained from the base of the skull through  the vertex without intravenous contrast. RADIATION DOSE REDUCTION: This exam was performed according to the departmental dose-optimization program which includes automated exposure control, adjustment of the mA and/or kV according to patient size and/or use of iterative reconstruction technique. COMPARISON:  05/31/2022 FINDINGS: Brain: Redemonstrated sequela of prior right occipital craniotomy with subjacent resection cavity and postoperative encephalomalacia. New hyperdense focus along the anterior aspect of the resection cavity, which measures 6 x 5 x 5 mm (AP x TR x CC) (series 3, image 21 and series 5, image 23). No evidence of acute infarct, mass effect, or midline shift. Redemonstrated ex vacuo dilatation of the right occipital and temporal horns. No new hydrocephalus. No extra-axial collection. Vascular: No hyperdense vessel. Skull: Prior right occipital craniotomy. Negative for fracture or focal lesion. Sinuses/Orbits: No acute finding. Other: The mastoids are well aerated. IMPRESSION: New 6 x 5 x 5 mm hyperdense focus along the anterior aspect of the right occipital resection cavity, concerning for recurrent tumor versus small focus of hemorrhage. MRI with and without contrast is recommended for further evaluation. These results were called by telephone at the time of  interpretation on 10/31/2022 at 11:27 pm to provider Rancor , who verbally acknowledged these results. Electronically Signed   By: Wiliam Ke M.D.   On: 10/31/2022 23:31   DG Chest Portable 1 View  Result Date: 10/31/2022 CLINICAL DATA:  Post intubation.  Patient brought in due to seizure. EXAM: PORTABLE CHEST 1 VIEW COMPARISON:  Radiograph 03/23/2022 FINDINGS: Subdiaphragmatic enteric tube. Endotracheal tube tip in the intrathoracic trachea 5.0 cm from the carina. No focal consolidation, pleural effusion, or pneumothorax. No displaced rib fractures. Normal cardiomediastinal silhouette. IMPRESSION: Endotracheal tube tip in the intrathoracic trachea 5.0 cm from the carina Electronically Signed   By: Minerva Fester M.D.   On: 10/31/2022 22:36    Assessment/Plan 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HCC) [C71.4]  Jakhari L Badman is clinically stable today, following recent admission for breakthrough seizures, status epilepticus.  MRI brain demonstrates stable findings from an oncologic standpoint, blood products are secondary to head trauma.  We again recommended continued imaging surveillance only at this time, he is agreeable with this plan.   For seizures, we recommended continuing Lamictal 250/250.  He still needs to pick up his resuce nayzilam from the pharmacy.  We ask that Waldon Lodes Darty return to clinic in 6 months following next brain MRI, or sooner as needed.    All questions were answered. The patient knows to call the clinic with any problems, questions or concerns. No barriers to learning were detected.  I have spent a total of 40 minutes of face-to-face and non-face-to-face time, excluding clinical staff time, preparing to see patient, ordering tests and/or medications, counseling the patient, and independently interpreting results and communicating results to the patient/family/caregiver    Henreitta Leber, MD Medical Director of [facility]  at [facility] 11/28/22 11:28 AM

## 2022-11-30 ENCOUNTER — Encounter: Payer: Self-pay | Admitting: Speech Pathology

## 2022-11-30 ENCOUNTER — Ambulatory Visit: Payer: Medicare Other | Admitting: Physical Therapy

## 2022-11-30 ENCOUNTER — Ambulatory Visit: Payer: Medicare Other | Admitting: Speech Pathology

## 2022-11-30 DIAGNOSIS — R296 Repeated falls: Secondary | ICD-10-CM

## 2022-11-30 DIAGNOSIS — R41841 Cognitive communication deficit: Secondary | ICD-10-CM

## 2022-11-30 DIAGNOSIS — R2681 Unsteadiness on feet: Secondary | ICD-10-CM | POA: Diagnosis not present

## 2022-11-30 DIAGNOSIS — M6281 Muscle weakness (generalized): Secondary | ICD-10-CM

## 2022-11-30 NOTE — Therapy (Signed)
OUTPATIENT SPEECH LANGUAGE PATHOLOGY TREATMENT   Patient Name: Stephen Beard MRN: [id number] DOB:1995/11/17, 27 y.o., male Today's Date: 11/30/2022  PCP: Virgilio Belling REFERRING PROVIDER: Jacquelynn Cree, PA-C  END OF SESSION:  End of Session - 11/30/22 1312     Visit Number 2    Number of Visits 17    Date for SLP Re-Evaluation 01/11/23    SLP Start Time 1315    SLP Stop Time  1400    SLP Time Calculation (min) 45 min    Activity Tolerance Patient tolerated treatment well              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HCC)    GAD (generalized anxiety disorder)    Headache    Intentional overdose (HCC) 12/2019   Psychotic disorder (HCC)    Schizoaffective disorder (HCC)    Seizures (HCC)    Substance abuse (HCC)    Suicidal ideation    Past Surgical History:  Procedure Laterality Date   APPLICATION OF CRANIAL NAVIGATION N/A 07/02/2019   Procedure: APPLICATION OF CRANIAL NAVIGATION;  Surgeon: Jadene Pierini, MD;  Location: [facility] OR;  Service: Neurosurgery;  Laterality: N/A;   CRANIOTOMY N/A 07/03/2019   Procedure: CRANIOTOMY HEMATOMA EVACUATION SUBDURAL;  Surgeon: Jadene Pierini, MD;  Location: [facility] OR;  Service: Neurosurgery;  Laterality: N/A;   CRANIOTOMY Right 07/03/2019   Procedure: CRANIOTOMY HEMATOMA EVACUATION SUBDURAL;  Surgeon: Jadene Pierini, MD;  Location: [facility] OR;  Service: Neurosurgery;  Laterality: Right;   CRANIOTOMY Right 07/02/2019   Procedure: CRANIOTOMY TUMOR EXCISION with Normajean Glasgow;  Surgeon: Jadene Pierini, MD;  Location: [facility] OR;  Service: Neurosurgery;  Laterality: Right;  posterior   Patient Active Problem List   Diagnosis Date Noted   Nontraumatic subdural hemorrhage, unspecified (HCC) 11/07/2022   Acute respiratory failure with hypoxia (HCC) 11/01/2022   AKI (acute kidney injury) (HCC) 11/01/2022   Headache 11/30/2021   Status epilepticus  (HCC) 05/22/2021   Alcohol abuse 01/11/2020   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HCC) 01/07/2020   Intentional overdose of drug in tablet form (HCC) 01/05/2020   Drug overdose, multiple drugs, intentional self-harm, initial encounter (HCC) 01/04/2020   Schizoaffective disorder, depressive type (HCC) 01/01/2020   Generalized anxiety disorder 01/01/2020   Severe episode of recurrent major depressive disorder, with psychotic features (HCC) 01/01/2020   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)    Focal seizures (HCC) 11/07/2019   Auditory hallucinations 07/30/2019   Goals of care, counseling/discussion 07/19/2019   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HCC) 07/01/2019   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/01/2019   Brain mass 06/30/2019   Attention deficit hyperactivity disorder (ADHD), combined type 03/23/2019   Mild episode of recurrent major depressive disorder (HCC) 03/23/2019   Tobacco abuse 10/04/2012   Allergic rhinitis 08/18/2012    ONSET DATE: 11/10/2022- referral date; 11/01/22 hospitalization   REFERRING DIAG: G40.901 (ICD-10-CM) - Status epilepticus (HCC) C71.4 (ICD-10-CM) - 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HCC) I62.00 (ICD-10-CM) - Nontraumatic subdural hemorrhage, unspecified (HCC)  THERAPY DIAG:  Cognitive communication deficit  Rationale for Evaluation and Treatment: Rehabilitation  SUBJECTIVE:   SUBJECTIVE STATEMENT: "I have trouble keeping up - I need extra time" Pt accompanied by: self  PERTINENT HISTORY: 27 year old male with past medical history as below, which is significant for anaplastic oligodendroglioma s/p resection and chemotherapy. He has had seizures since that time  which have not been managed with lamotrigine. In the evening hours of 11/01/22 he suffered a seizure at home which went on for 15 minutes before the arrival of EMS. He was treated with benzodiazepines by EMS without improvement. He continued to seize  upon arrival to the emergency department he was treated with additional benzodiazepines and IV Keppra bolus. Duration of seizure activity estimated at 30 minutes. Seizures did then eventually break, but the patient was unresponsive and desaturating requiring intubation in the emergency department. Pt was intubated 8/19-8/23. EEG suggestive of epileptogenicity and cortical dysfunction in right parieto- occipital region likely secondary to underlying structural abnormality.  PAIN:  Are you having pain? No  FALLS: Has patient fallen in last 6 months?  See PT evaluation for details  LIVING ENVIRONMENT: Lives with: lives with their family Lives in: House/apartment  PLOF:  Level of assistance: Independent with ADLs, Needed assistance with IADLS Employment: On disability  PATIENT GOALS: "To get my life back, have my own place, go back to work"  OBJECTIVE:    COGNITION: Overall cognitive status: Impaired Areas of impairment:  Attention: Impaired: Selective, Alternating Memory: Impaired: Working Teacher, music term Scientist, research (physical sciences) function: Impaired: Problem solving, Organization, Error awareness, Self-correction, and Slow processing Functional deficits:   COGNITIVE COMMUNICATION: Following directions: Follows multi-step commands inconsistently  Auditory comprehension: Impaired: due to slow processing and attention Verbal expression: WFL Functional communication: WFL  ORAL MOTOR EXAMINATION: Overall status: WFL Comments:   STANDARDIZED ASSESSMENTS: CLQT: Attention: Mild, Memory: Mild, Executive Function: WNL, Language: WNL, Visuospatial Skills: Mild, and Clock Drawing: Mild  PATIENT REPORTED OUTCOME MEASURES (PROM): Cognitive function: Short Form: 16/40 - Warden rated 1 or very often/several times a day for slow thinking, working really hard to pay attention, and trouble concentrating. He rated often or a lot to having reading something several times to understand, reading and  following complex directions, learning new tasks or instructions.    TODAY'S TREATMENT:                                                                                                                                         DATE:   11/30/22: Stephen Beard continues to report significant attention difficulty (he was taken off ADHD meds due to contraindicated with seizure meds. Today we targeted compensatory compensations for auditory processing and attention to conversations - see Pt Instructions. We collaboratively generated 5 strategies to improve processing. Targeted recalling and completing household chores and to do tasks generating 3 priorities for the week, breaking them into steps (washer, dryer, fold) to cross off after each step is done (he endorses forgetting clothes in the washer or note being able to find clothes, forgetting they are in the dryer), as well as "extra tasks" he would like to complete, such as putting his phone number is his Bible as he frequently leaves it at church or in a friend's car. He reports difficulty  recalling names of people he has know for a long time. We generated strategy of using a sticky note with names of church members he interacts with inside of his Bible to review names on the way to church or to look up a name when he needs to.   11/16/22 Eval day: Educated pt re results of CLQT, reviewed goals and tx using compensatory strategies   PATIENT EDUCATION: Education details: See Today's Treatment and Patient Instructions; compensatory strategies for cognitive impairment Person educated: Patient Education method: Explanation, Demonstration, and Verbal cues Education comprehension: verbal cues required and needs further education   GOALS: Goals reviewed with patient? Yes  SHORT TERM GOALS: Target date: 12/14/22  Pt will carryover 2 compensatory strategies to support auditory processing in conversation and for multistep instructions with occasional min  A Baseline: Goal status: INITIAL  2.  Pt will carryover 2 compensations for attention/memory in the kitchen with occasional min A Baseline:  Goal status: INITIAL  3.  Pt will use external memory aids/notebook or calendar to recall and complete 3 house hold or community tasks a day Baseline:  Goal status: INITIAL  4.  Pt will carryover 2 compensatory strategies to support processing of his thoughts during conversation to maintain train of thought with occasional min A Baseline:  Goal status: INITIAL   LONG TERM GOALS: Target date: 01/11/23  Pt will carryover 3 compensations, including environmental modifications to support processing verbal information from family, health care team and friends with rare min A Baseline:  Goal status: INITIAL  2.  Pt will use external memory aids to recall schedule, to do, appointments and errands with mod I over 1 week Baseline:  Goal status: INITIAL  3.  Pt will carryover 3 compensatory strategies to maintain train of thought and communication during conversations with mod I Baseline:  Goal status: INITIAL  4.  Pt will improve score on Cogntive Function Short Form by 2 points Baseline: 16 Goal status: INITIAL   ASSESSMENT:  CLINICAL IMPRESSION: Patient is a 27 y.o. male who was seen today for mild cognitive communication impairments. He reports difficulty following conversations, maintaining his train of thought, "registering" information and understanding verbal instructions. He also reports slow reading with reduced processing. He is managing his meds, finances, light cooking and laundry with success. He would like to return to living independently and return to work as able. I recommend skilled ST to maximize cognitive communication skills for safety, independence, QOL.   OBJECTIVE IMPAIRMENTS: include attention, memory, awareness, executive functioning, and receptive language. These impairments are limiting patient from return to work,  household responsibilities, and ADLs/IADLs. Factors affecting potential to achieve goals and functional outcome are medical prognosis.. Patient will benefit from skilled SLP services to address above impairments and improve overall function.  REHAB POTENTIAL: Good  PLAN:  SLP FREQUENCY: 2x/week  SLP DURATION: 8 weeks  PLANNED INTERVENTIONS: Language facilitation, Environmental controls, Cueing hierachy, Cognitive reorganization, Internal/external aids, Functional tasks, Multimodal communication approach, SLP instruction and feedback, Compensatory strategies, and Patient/family education    Alice Reichert Radene Journey, CCC-SLP 11/30/2022, 3:39 PM

## 2022-11-30 NOTE — Patient Instructions (Addendum)
Attention is the gateway to memory - if you don't attend to something or someone you won't put it in your brain to recall it  Get the persons attention before you speak  Use eye contact and face the person you are speaking to  Be in close proximity to the person you are speaking to  Turn down any noise in the environment such as the TV, walk away from loud appliances, air conditioners, fans, dish washers etc  Repeat back what you have heard "Let me make sure I got it all, this is what you said...Marland KitchenMarland Kitchen"  Get important information in writing - "can you text me that"   In large gatherings, sit or stay on the side not the center of the room  Try to sit with a wall behind you or in a corner so noise isn't coming at you from all directions when dining out or attending gatherings  If you have airpods or blue tooth headphones, the Live Listen feature on you iPhone can sync with your pods to act as a microphone to enhance conversation near you or at your table (control center, hearing, AirPods, Live Listen)  Use timers in the kitchen   Limit distractions in the kitchen - no phone distraction  Get out all of the ingredients and supplies you need before cooking (like a cooking show)  Internal distractions are real and also affect you ability to focus   Tips to help facilitate better attention, concentration, focus   Do harder, longer tasks when you are most alert/awake  Break down larger tasks into small parts  Limit distractions of TV, radio, conversation, e mails/texts, appliance noise, etc - if a job is important, do it in a quiet room  Be aware of how you are functioning in high stimulation environments such as large stores, parties, restaurants - any place with lots of lights, noise, signs etc  Group conversations may be more difficult to process than one on one conversations  Give yourself extra time to process conversation, reading materials, directions or information from your  healthcare providers  Organization is key - clutters of laundry, mail, paperwork, dirty dishes - all make it more difficult to concentrate  Before you start a task, have all the needed supplies, directions, recipes ready and organized. This way you don't have to go looking for something in the middle of a task and become distracted.   Be aware of fatigue - take rests or breaks when needed to re-group and re-focus  Make a to - do list - set your priorities for the week - review the list a few times a day - cross off when you complete a task - you can use your planner  Include steps of the task:                                           Stephen Beard                                          Fold Add "  put my cell number in my Bible" on your to do list under "Extras"  And cross off as you go  Brain HQ app  Review names before you get together with people  Keep a list of names in the front or back of your planner  Use a sticky note with names of your church members inside cover of your Bible as a little cheat and to review before you get

## 2022-11-30 NOTE — Therapy (Signed)
OUTPATIENT PHYSICAL THERAPY NEURO TREATMENT   Patient Name: Stephen Beard MRN: [id number] DOB:June 19, 1995, 27 y.o., male Today's Date: 11/30/2022   PCP: Morrell Riddle, PA-C   REFERRING PROVIDER:  Jacquelynn Cree, PA-C  END OF SESSION:  PT End of Session - 11/30/22 1402     Visit Number 2    Number of Visits 7    Date for PT Re-Evaluation 01/15/23    Authorization Type Medicare    PT Start Time 1400    PT Stop Time 1444    PT Time Calculation (min) 44 min    Equipment Utilized During Treatment Gait belt    Activity Tolerance Patient tolerated treatment well    Behavior During Therapy WFL for tasks assessed/performed              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HCC)    GAD (generalized anxiety disorder)    Headache    Intentional overdose (HCC) 12/2019   Psychotic disorder (HCC)    Schizoaffective disorder (HCC)    Seizures (HCC)    Substance abuse (HCC)    Suicidal ideation    Past Surgical History:  Procedure Laterality Date   APPLICATION OF CRANIAL NAVIGATION N/A 07/02/2019   Procedure: APPLICATION OF CRANIAL NAVIGATION;  Surgeon: Jadene Pierini, MD;  Location: [facility];  Service: Neurosurgery;  Laterality: N/A;   CRANIOTOMY N/A 07/03/2019   Procedure: CRANIOTOMY HEMATOMA EVACUATION SUBDURAL;  Surgeon: Jadene Pierini, MD;  Location: [facility];  Service: Neurosurgery;  Laterality: N/A;   CRANIOTOMY Right 07/03/2019   Procedure: CRANIOTOMY HEMATOMA EVACUATION SUBDURAL;  Surgeon: Jadene Pierini, MD;  Location: [facility];  Service: Neurosurgery;  Laterality: Right;   CRANIOTOMY Right 07/02/2019   Procedure: CRANIOTOMY TUMOR EXCISION with Normajean Glasgow;  Surgeon: Jadene Pierini, MD;  Location: [facility];  Service: Neurosurgery;  Laterality: Right;  posterior   Patient Active Problem List   Diagnosis Date Noted   Nontraumatic subdural hemorrhage, unspecified (HCC) 11/07/2022    Acute respiratory failure with hypoxia (HCC) 11/01/2022   AKI (acute kidney injury) (HCC) 11/01/2022   Headache 11/30/2021   Status epilepticus (HCC) 05/22/2021   Alcohol abuse 01/11/2020   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HCC) 01/07/2020   Intentional overdose of drug in tablet form (HCC) 01/05/2020   Drug overdose, multiple drugs, intentional self-harm, initial encounter (HCC) 01/04/2020   Schizoaffective disorder, depressive type (HCC) 01/01/2020   Generalized anxiety disorder 01/01/2020   Severe episode of recurrent major depressive disorder, with psychotic features (HCC) 01/01/2020   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)    Focal seizures (HCC) 11/07/2019   Auditory hallucinations 07/30/2019   Goals of care, counseling/discussion 07/19/2019   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HCC) 07/01/2019   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/01/2019   Brain mass 06/30/2019   Attention deficit hyperactivity disorder (ADHD), combined type 03/23/2019   Mild episode of recurrent major depressive disorder (HCC) 03/23/2019   Tobacco abuse 10/04/2012   Allergic rhinitis 08/18/2012    ONSET DATE: 11/11/2022  REFERRING DIAG: I62.00 (ICD-10-CM) - Nontraumatic subdural hemorrhage, unspecified  THERAPY DIAG:  Muscle weakness (generalized)  Unsteadiness on feet  Repeated falls  Rationale for Evaluation and Treatment: Rehabilitation  SUBJECTIVE:  SUBJECTIVE STATEMENT: Pt reports he has been doing well, working on squats, SLS, tandem gait, then SLS and tandem are still difficult. He maybe overdid it with squats and had soreness in glutes and down back of legs.  No falls. No pain today.   Pt accompanied by: self  PERTINENT HISTORY: Pt presented on 10/31/22 with  seizure >30 mins. EEG consistent with epilepsy and cortical dysfunction arising from R hemisphere, also moderate diffuse encephalopathy (same as EEG on admission a year ago). MRI showed trace SDH along the R parietal convexity. 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 No  PRECAUTIONS: Fall and Other: hx of seizures     WEIGHT BEARING RESTRICTIONS: No  FALLS: Has patient fallen in last 6 months? Yes. Number of falls 3 or 4, did not hurt himself   LIVING ENVIRONMENT: Lives with:  lives with mom  Lives in: House/apartment Stairs: No Has following equipment at home: None  PLOF: Independent with community mobility without device and has not driven since before his brain tumor surgery (2021)  PATIENT GOALS: Wants to have good balance again, wants to get his thighs good   OBJECTIVE:   DIAGNOSTIC FINDINGS: MRI brain 11/01/22 IMPRESSION: 1. Unchanged 6 mm focus of acute hemorrhage along the anterior margin of the right occipital resection cavity. No new or nodular enhancement to suggest local recurrence. 2. Trace subdural hemorrhage along the right parietal convexity.  COGNITION: Overall cognitive status: Within functional limits for tasks assessed and History of cognitive impairments - at baseline   SENSATION: Light touch: WFL and bilat LE  Sometimes will get numbness in L arm   COORDINATION: Nose to finger: Dysmetric LLE Heel to shin: WNL    POSTURE: No Significant postural limitations   LOWER EXTREMITY MMT:    MMT Right Eval Left Eval  Hip flexion 5 5  Hip extension    Hip abduction 4+ 4+  Hip adduction 4 4  Hip internal rotation    Hip external rotation    Knee flexion 5 4+  Knee extension 4+ 4+  Ankle  dorsiflexion 5 5  Ankle plantarflexion    Ankle inversion    Ankle eversion    (Blank rows = not tested)  All tested in sitting Noted incr tremors with hip ADD/ABD    TRANSFERS: Assistive device utilized: None  Sit to stand: Complete Independence Stand to sit: Complete Independence Noted pt having more weight through R side and RLE staggered posteriorly    STAIRS: Level of Assistance: SBA Stair Negotiation Technique: Alternating Pattern  Forwards with No Rails Single Rail on Right Number of Stairs: 4 x 2 reps = 8 total   Height of Stairs: 6  Comments: Pt slower to descend due to weakness, balance, and visual deficits when not using handrail   GAIT: Gait pattern: decreased arm swing- Left, decreased stride length, lateral hip instability, and decreased trunk rotation Distance walked: Clinic distances  Assistive device utilized: None Level of assistance: Modified independence Comments: No overt unsteadiness noted   FUNCTIONAL TESTS:  5 times sit to stand: 10.8 seconds with no UE support  10 meter walk test: 10.2 seconds = 3.22 ft/sec  SLS: RLE and LLE: 1-2 seconds       M-CTSIB  Condition 1: Firm Surface, EO 30 Sec, Normal Sway  Condition  2: Firm Surface, EC 30 Sec, Normal Sway  Condition 3: Foam Surface, EO 30 Sec, Mild Sway  Condition 4: Foam Surface, EC 13 Sec       TODAY'S TREATMENT:                                                                                                                              TherAct To work on static standing balance in // bars with focus on no UE support: On airex Romberg stance 3 x 30 sec each Added in EC 3 x 30 sec each L/R modified tandem stance 3 x 30 sec each Rocker board A/P 3 x 30 sec each L/R 3 x 30 sec each Added in ball toss x 30 reps  TherEx Alt L/R 6" step taps at bottom of stairs x 10 reps B Alt L/R 6" step-ups at bottom of stairs x 10 reps B Alt L/R 6" eccentric step downs at bottom of stairs x 10 reps  B  At countertop with BUE support to work on LE coordination: Stance jacks 2 x 10 reps Mountain climbers 2 x 10 reps  NMR In quadruped to work on functional strengthening: Alt LE kicks x 10 reps B Alt UE/LE lifts 2 x 10 reps B (bird dogs)  PATIENT EDUCATION: Education details: continue current exercises at home Person educated: Patient Education method: Medical illustrator Education comprehension: verbalized understanding and returned demonstration  HOME EXERCISE PROGRAM: Will provide at future session   GOALS: Goals reviewed with patient? Yes  SHORT TERM GOALS: ALL STGS = LTGS  LONG TERM GOALS: Target date: 12/14/2022  Pt will be independent with final HEP for strength/balance/gait in order to build upon functional gains made in therapy. Baseline:  Goal status: INITIAL  2.  Pt will improve FGA to at least a 29/30 in order to demo decr fall risk. Baseline: 26/30 Goal status: INITIAL  3.  Pt will perform 8 steps with no handrail and reciprocal pattern with mod I in order to demo improved balance.  Baseline:  Goal status: INITIAL  4.  Pt will improve condition 4 of mCTSIB to at least 25 seconds to demo improved vestibular input for balance.  Baseline: 13 seconds  Goal status: INITIAL  5.  Pt will improve gait speed with no to at least 3.5 ft/sec in order to demo improved community mobility.  Baseline: 3.22 ft/sec Goal status: INITIAL   ASSESSMENT:  CLINICAL IMPRESSION: Emphasis of skilled PT session on working on various LE strengthening and static and dynamic balance. Pt does fatigue quickly in his LLE>RLE with strengthening tasks this session. Pt exhibits most difficulty performing static stance on airex in Romberg stance with EC and on rockerboard in A/P configuration. Pt needs increased time and cues in order to process movements and for coordination. Pt continues to benefit from skilled therapy services to work towards improving his balance and  decreasing his fall risk. Continue POC.  OBJECTIVE IMPAIRMENTS: Abnormal gait, decreased activity tolerance, decreased balance, decreased cognition, decreased coordination, decreased strength, and impaired sensation.   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   REHAB POTENTIAL: Good  CLINICAL DECISION MAKING: Evolving/moderate complexity  EVALUATION COMPLEXITY: Moderate  PLAN:  PT FREQUENCY: 1-2x/week  PT DURATION: 8 weeks - due to delay in scheduling   PLANNED INTERVENTIONS: Therapeutic exercises, Therapeutic activity, Neuromuscular re-education, Balance training, Gait training, Patient/Family education, Self Care, Joint mobilization, Stair training, Vestibular training, Manual therapy, and Re-evaluation  PLAN FOR NEXT SESSION: Initiate HEP for LLE>RLE strength, balance with EC, compliant surfaces, narrow BOS. Work on high level balance and strengthening exercises. Pt wants to be more steady when going up and down stairs , resisted gait, Blaze pods?, resisted step ups    Peter Congo, PT, DPT, CSRS  11/30/2022, 2:44 PM

## 2022-12-05 ENCOUNTER — Ambulatory Visit: Payer: Medicare Other

## 2022-12-05 ENCOUNTER — Ambulatory Visit: Payer: Medicare Other | Admitting: Physical Therapy

## 2022-12-05 ENCOUNTER — Ambulatory Visit: Payer: Medicare Other | Admitting: Occupational Therapy

## 2022-12-05 DIAGNOSIS — R2681 Unsteadiness on feet: Secondary | ICD-10-CM

## 2022-12-05 DIAGNOSIS — R296 Repeated falls: Secondary | ICD-10-CM

## 2022-12-05 DIAGNOSIS — R278 Other lack of coordination: Secondary | ICD-10-CM

## 2022-12-05 DIAGNOSIS — R41844 Frontal lobe and executive function deficit: Secondary | ICD-10-CM

## 2022-12-05 DIAGNOSIS — R41841 Cognitive communication deficit: Secondary | ICD-10-CM

## 2022-12-05 DIAGNOSIS — R29818 Other symptoms and signs involving the nervous system: Secondary | ICD-10-CM

## 2022-12-05 DIAGNOSIS — M6281 Muscle weakness (generalized): Secondary | ICD-10-CM

## 2022-12-05 NOTE — Therapy (Signed)
OUTPATIENT SPEECH LANGUAGE PATHOLOGY TREATMENT   Patient Name: Stephen Beard MRN: [id number] DOB:November 22, 1995, 27 y.o., male Today's Date: 12/05/2022  PCP: Virgilio Belling REFERRING PROVIDER: Jacquelynn Cree, PA-C  END OF SESSION:  End of Session - 12/05/22 1227     Visit Number 3    Number of Visits 17    Date for SLP Re-Evaluation 01/11/23    SLP Start Time 1323    SLP Stop Time  1400    SLP Time Calculation (min) 37 min    Activity Tolerance Patient tolerated treatment well               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HCC)    GAD (generalized anxiety disorder)    Headache    Intentional overdose (HCC) 12/2019   Psychotic disorder (HCC)    Schizoaffective disorder (HCC)    Seizures (HCC)    Substance abuse (HCC)    Suicidal ideation    Past Surgical History:  Procedure Laterality Date   APPLICATION OF CRANIAL NAVIGATION N/A 07/02/2019   Procedure: APPLICATION OF CRANIAL NAVIGATION;  Surgeon: Jadene Pierini, MD;  Location: [facility];  Service: Neurosurgery;  Laterality: N/A;   CRANIOTOMY N/A 07/03/2019   Procedure: CRANIOTOMY HEMATOMA EVACUATION SUBDURAL;  Surgeon: Jadene Pierini, MD;  Location: [facility];  Service: Neurosurgery;  Laterality: N/A;   CRANIOTOMY Right 07/03/2019   Procedure: CRANIOTOMY HEMATOMA EVACUATION SUBDURAL;  Surgeon: Jadene Pierini, MD;  Location: [facility];  Service: Neurosurgery;  Laterality: Right;   CRANIOTOMY Right 07/02/2019   Procedure: CRANIOTOMY TUMOR EXCISION with Normajean Glasgow;  Surgeon: Jadene Pierini, MD;  Location: [facility];  Service: Neurosurgery;  Laterality: Right;  posterior   Patient Active Problem List   Diagnosis Date Noted   Nontraumatic subdural hemorrhage, unspecified (HCC) 11/07/2022   Acute respiratory failure with hypoxia (HCC) 11/01/2022   AKI (acute kidney injury) (HCC) 11/01/2022   Headache 11/30/2021   Status epilepticus  (HCC) 05/22/2021   Alcohol abuse 01/11/2020   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HCC) 01/07/2020   Intentional overdose of drug in tablet form (HCC) 01/05/2020   Drug overdose, multiple drugs, intentional self-harm, initial encounter (HCC) 01/04/2020   Schizoaffective disorder, depressive type (HCC) 01/01/2020   Generalized anxiety disorder 01/01/2020   Severe episode of recurrent major depressive disorder, with psychotic features (HCC) 01/01/2020   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)    Focal seizures (HCC) 11/07/2019   Auditory hallucinations 07/30/2019   Goals of care, counseling/discussion 07/19/2019   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HCC) 07/01/2019   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/01/2019   Brain mass 06/30/2019   Attention deficit hyperactivity disorder (ADHD), combined type 03/23/2019   Mild episode of recurrent major depressive disorder (HCC) 03/23/2019   Tobacco abuse 10/04/2012   Allergic rhinitis 08/18/2012    ONSET DATE: 11/10/2022- referral date; 11/01/22 hospitalization   REFERRING DIAG: G40.901 (ICD-10-CM) - Status epilepticus (HCC) C71.4 (ICD-10-CM) - 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HCC) I62.00 (ICD-10-CM) - Nontraumatic subdural hemorrhage, unspecified (HCC)  THERAPY DIAG: Cognitive communication deficit  Rationale for Evaluation and Treatment: Rehabilitation  SUBJECTIVE:   SUBJECTIVE STATEMENT: "no headache today" Pt accompanied by: self  PERTINENT HISTORY: 27 year old male with past medical history as below, which is significant for anaplastic oligodendroglioma s/p resection and chemotherapy. He has had seizures since that time which have not been managed with lamotrigine.  In the evening hours of 11/01/22 he suffered a seizure at home which went on for 15 minutes before the arrival of EMS. He was treated with benzodiazepines by EMS without improvement. He continued to seize upon arrival to the emergency  department he was treated with additional benzodiazepines and IV Keppra bolus. Duration of seizure activity estimated at 30 minutes. Seizures did then eventually break, but the patient was unresponsive and desaturating requiring intubation in the emergency department. Pt was intubated 8/19-8/23. EEG suggestive of epileptogenicity and cortical dysfunction in right parieto- occipital region likely secondary to underlying structural abnormality.  PAIN:  Are you having pain? No  FALLS: Has patient fallen in last 6 months?  See PT evaluation for details  LIVING ENVIRONMENT: Lives with: lives with their family Lives in: House/apartment  PLOF:  Level of assistance: Independent with ADLs, Needed assistance with IADLS Employment: On disability  PATIENT GOALS: "To get my life back, have my own place, go back to work"  OBJECTIVE:   TODAY'S TREATMENT:                                                                                                                                          12/05/22: Endorsed using budget and written aid for bill pay, with benefit reported. SLP addressed planning and problem-solving via strategies to improve attention and make daily planner more efficient (using sticky note to mark the correct week, verbalizing appointment times/dates). Pt endorsed attempting to use strategies taught in previous session; however, experienced difficulty due to underlying ADHD. Noted intermittent word finding in conversation, which often contributed to poor topic maintenance. Further assessed word-finding via BNT with 90% accuracy given occasional mod A and frequent self-corrections. Educated patient on word finding strategies with handout provided. Pt self-reported deviation in attention during education, with appropriate request for SLP to repeat information. Affirmed use of self-advocacy when deviations in attention occur.   11/30/22: Login continues to report significant attention difficulty  (he was taken off ADHD meds due to contraindicated with seizure meds. Today we targeted compensatory compensations for auditory processing and attention to conversations - see Pt Instructions. We collaboratively generated 5 strategies to improve processing. Targeted recalling and completing household chores and to do tasks generating 3 priorities for the week, breaking them into steps (washer, dryer, fold) to cross off after each step is done (he endorses forgetting clothes in the washer or note being able to find clothes, forgetting they are in the dryer), as well as "extra tasks" he would like to complete, such as putting his phone number is his Bible as he frequently leaves it at church or in a friend's car. He reports difficulty recalling names of people he has know for a long time. We generated strategy of using a sticky note with names of church members he interacts with inside of his Bible to review names on the way to church  or to look up a name when he needs to.   11/16/22 Eval day: Educated pt re results of CLQT, reviewed goals and tx using compensatory strategies   PATIENT EDUCATION: Education details: See Today's Treatment and Patient Instructions; compensatory strategies for cognitive impairment Person educated: Patient Education method: Explanation, Demonstration, and Verbal cues Education comprehension: verbal cues required and needs further education   GOALS: Goals reviewed with patient? Yes  SHORT TERM GOALS: Target date: 12/14/22  Pt will carryover 2 compensatory strategies to support auditory processing in conversation and for multistep instructions with occasional min A Baseline: Goal status: IN PROGRESS  2.  Pt will carryover 2 compensations for attention/memory in the kitchen with occasional min A Baseline:  Goal status: IN PROGRESS  3.  Pt will use external memory aids/notebook or calendar to recall and complete 3 house hold or community tasks a day Baseline:  Goal  status: IN PROGRESS  4.  Pt will carryover 2 compensatory strategies to support processing of his thoughts during conversation to maintain train of thought with occasional min A Baseline:  Goal status: IN PROGRESS   LONG TERM GOALS: Target date: 01/11/23  Pt will carryover 3 compensations, including environmental modifications to support processing verbal information from family, health care team and friends with rare min A Baseline:  Goal status: IN PROGRESS  2.  Pt will use external memory aids to recall schedule, to do, appointments and errands with mod I over 1 week Baseline:  Goal status: IN PROGRESS  3.  Pt will carryover 3 compensatory strategies to maintain train of thought and communication during conversations with mod I Baseline:  Goal status: IN PROGRESS  4.  Pt will improve score on Cogntive Function Short Form by 2 points Baseline: 16 Goal status: IN PROGRESS   ASSESSMENT:  CLINICAL IMPRESSION: Patient is a 27 y.o. male who was seen today for mild cognitive communication impairments. He reports difficulty following conversations, maintaining his train of thought, "registering" information and understanding verbal instructions. He also reports slow reading with reduced processing. He is managing his meds, finances, light cooking and laundry with success. He would like to return to living independently and return to work as able. I recommend skilled ST to maximize cognitive communication skills for safety, independence, QOL.   OBJECTIVE IMPAIRMENTS: include attention, memory, awareness, executive functioning, and receptive language. These impairments are limiting patient from return to work, household responsibilities, and ADLs/IADLs. Factors affecting potential to achieve goals and functional outcome are medical prognosis.. Patient will benefit from skilled SLP services to address above impairments and improve overall function.  REHAB POTENTIAL: Good  PLAN:  SLP  FREQUENCY: 2x/week  SLP DURATION: 8 weeks  PLANNED INTERVENTIONS: Language facilitation, Environmental controls, Cueing hierachy, Cognitive reorganization, Internal/external aids, Functional tasks, Multimodal communication approach, SLP instruction and feedback, Compensatory strategies, and Patient/family education    Gracy Racer, CCC-SLP 12/05/2022, 4:20 PM

## 2022-12-05 NOTE — Patient Instructions (Addendum)
  Access Code: [email or web address] URL: [email or web address] Date: 12/05/2022 Prepared by: Amada Kingfisher  Exercises - Putty Squeezes  - 1 x daily - 10 reps - Rolling Putty on Table  - 1 x daily - 10 reps - 3-Point Pinch with Putty  - 1 x daily - 10 reps - Tip PUSH with Putty  - 1 x daily - 10 reps - Key Pinch with Putty  - 1 x daily - 10 reps - Finger Pinch and Pull with Putty  - 1 x daily - 10 reps - Removing Marbles from Putty  - 1 x daily - 10 reps

## 2022-12-05 NOTE — Therapy (Signed)
OUTPATIENT OCCUPATIONAL THERAPY NEURO TREATMENT  Patient Name: Stephen Beard MRN: [id number] DOB:Jan 07, 1996, 27 y.o., male Today's Date: 12/05/2022  PCP: Morrell Riddle, PA-C REFERRING PROVIDER: Jacquelynn Cree, PA-C  END OF SESSION:  OT End of Session - 12/05/22 1225     Visit Number 2    Number of Visits 10    Date for OT Re-Evaluation 12/30/22    Authorization Type MEDICARE PART A AND B    OT Start Time 1232    OT Stop Time 1315    OT Time Calculation (min) 43 min    Equipment Utilized During Treatment --    Activity Tolerance Patient tolerated treatment well    Behavior During Therapy WFL for tasks assessed/performed             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HCC)    GAD (generalized anxiety disorder)    Headache    Intentional overdose (HCC) 12/2019   Psychotic disorder (HCC)    Schizoaffective disorder (HCC)    Seizures (HCC)    Substance abuse (HCC)    Suicidal ideation    Past Surgical History:  Procedure Laterality Date   APPLICATION OF CRANIAL NAVIGATION N/A 07/02/2019   Procedure: APPLICATION OF CRANIAL NAVIGATION;  Surgeon: Jadene Pierini, MD;  Location: [facility] OR;  Service: Neurosurgery;  Laterality: N/A;   CRANIOTOMY N/A 07/03/2019   Procedure: CRANIOTOMY HEMATOMA EVACUATION SUBDURAL;  Surgeon: Jadene Pierini, MD;  Location: [facility] OR;  Service: Neurosurgery;  Laterality: N/A;   CRANIOTOMY Right 07/03/2019   Procedure: CRANIOTOMY HEMATOMA EVACUATION SUBDURAL;  Surgeon: Jadene Pierini, MD;  Location: [facility] OR;  Service: Neurosurgery;  Laterality: Right;   CRANIOTOMY Right 07/02/2019   Procedure: CRANIOTOMY TUMOR EXCISION with Normajean Glasgow;  Surgeon: Jadene Pierini, MD;  Location: [facility] OR;  Service: Neurosurgery;  Laterality: Right;  posterior   Patient Active Problem List   Diagnosis Date Noted   Nontraumatic subdural hemorrhage, unspecified (HCC) 11/07/2022    Acute respiratory failure with hypoxia (HCC) 11/01/2022   AKI (acute kidney injury) (HCC) 11/01/2022   Headache 11/30/2021   Status epilepticus (HCC) 05/22/2021   Alcohol abuse 01/11/2020   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HCC) 01/07/2020   Intentional overdose of drug in tablet form (HCC) 01/05/2020   Drug overdose, multiple drugs, intentional self-harm, initial encounter (HCC) 01/04/2020   Schizoaffective disorder, depressive type (HCC) 01/01/2020   Generalized anxiety disorder 01/01/2020   Severe episode of recurrent major depressive disorder, with psychotic features (HCC) 01/01/2020   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)    Focal seizures (HCC) 11/07/2019   Auditory hallucinations 07/30/2019   Goals of care, counseling/discussion 07/19/2019   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HCC) 07/01/2019   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/01/2019   Brain mass 06/30/2019   Attention deficit hyperactivity disorder (ADHD), combined type 03/23/2019   Mild episode of recurrent major depressive disorder (HCC) 03/23/2019   Tobacco abuse 10/04/2012   Allergic rhinitis 08/18/2012    ONSET DATE: 11/15/22  REFERRING DIAG: I62.00 (ICD-10-CM) - Nontraumatic subdural hemorrhage, unspecified  THERAPY DIAG:  Muscle weakness (generalized)  Other lack of coordination  Frontal lobe and executive function deficit  Other symptoms and signs involving the nervous system  Rationale for Evaluation and Treatment: Rehabilitation  SUBJECTIVE:   SUBJECTIVE STATEMENT: Patient reports no new seizures.  Pt has recently had headaches though potentially from  the "tumor" but he's not sure.  Pt takes Goody powders to help with good relief reported.  Pt accompanied by: self  PERTINENT HISTORY:   Pt presented to the hospital on 10/31/22 with seizure >30 mins. EEG consistent with epilepsy and cortical dysfunction arising from R hemisphere, also moderate diffuse  encephalopathy (same as EEG on admission a year ago). MRI showed trace SDH along the R parietal convexity.  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 No  FALLS: Has patient fallen in last 6 months? Yes. Number of falls maybe 1 in the past 6 months   He has fallen several times before that but relates it to his vision and not seeing something right. Has to go see an ophthalmologist   LIVING ENVIRONMENT: Lives with: lives with their family (Parents & uncle with DS) Lives in: Apartment Stairs: No Has following equipment at home: None  PLOF: Independent - worked at [employer] (prior to resection/seizures in 2021).  Pt has not driven since then due to seizures and visual deficits.  Prior to most recent seizure, he was not working but has been volunteering at [employer].   PATIENT GOALS: Patient reports he wants to work on his cognition ie) understanding directions; his vision; and his balance.  OBJECTIVE:   HAND DOMINANCE: Right  ADLs: Overall ADLs: Independent Transfers/ambulation related to ADLs: Independent Tub Shower transfers: MI - holds the wall for support Equipment: none  IADLs:  Driving: Not driving x 3 years due to seizures and vision; reports that he has been unable to use [employer] [place] due to transportation not coming to their home Shopping: Accompanies parent (mom & step dad) to go shopping Light housekeeping: Helps do his own laundry,  Meal Prep: Cooks a little - does not use the air fryer Community mobility: Ind Medication management: Takes his own medication but does report that he has missed some medication in the past Financial management: Not  reported Handwriting:  Not tested  MOBILITY STATUS: Independent and Hx of falls  POSTURE COMMENTS:  No Significant postural limitations Sitting balance: WFL  ACTIVITY TOLERANCE: Activity tolerance: Good  UPPER EXTREMITY ROM:    Active ROM Right eval Left eval  Shoulder flexion WFL   Shoulder abduction   Shoulder adduction   Shoulder extension   Shoulder internal rotation   Shoulder external rotation   Elbow flexion   Elbow extension   Wrist flexion   Wrist extension   Wrist ulnar deviation   Wrist radial deviation   Wrist pronation   Wrist supination   (Blank rows = not tested)  UPPER EXTREMITY MMT:     MMT Right eval Left eval  Shoulder flexion 4 4  Shoulder abduction    Shoulder adduction    Shoulder extension    Shoulder internal rotation    Shoulder external rotation    Middle trapezius    Lower trapezius    Elbow flexion 4 4  Elbow extension    Wrist flexion    Wrist extension    Wrist ulnar deviation    Wrist radial deviation    Wrist pronation    Wrist supination    (Blank rows = not tested)  HAND FUNCTION: Grip strength: Right: 59.3, 55.5, 62.3  lbs;  Left: 60.8, 56.2, 69.4  lbs Average: Right: 59.0 lbs Left 62.1 lbs  COORDINATION: 9 Hole Peg test: Right: 34.5 sec; Left: 33.7 sec  SENSATION: WFL  EDEMA: NA  MUSCLE TONE: WFL  COGNITION: Overall cognitive status: Within functional limits for tasks assessed  VISION: Subjective report: Patient can see distance better than up close. He does report need to see an ophthalmologist and reports that his L eye is worse than R Baseline vision: Wears glasses all the time Visual history:  Impaired since brain tumor.  Does report it was pressing on his L eye  VISION ASSESSMENT: Impaired Tracking/Visual pursuits: Decreased smoothness with horizontal tracking and especially towards the R side  Convergence ~ 12"  Inferior/superior R side intact, L side - full visual field cut for approaching  objects from L side  Patient has difficulty with following activities due to following visual impairments: finding objects from L side   PERCEPTION: Not tested  PRAXIS: Not tested  OBSERVATIONS: Pt ambulates with no AE with no loss of balance. The pt appears well kept and brought his new calendar where he had his upcoming therapy appts listed.     TODAY'S TREATMENT:                                                                                                                               Coordination Exercise/Activity handout with images provided for various activities to work on B UE finger ROM, dexterity and isolated movements with demonstration and practice, as well as modification, hand over hand guidance and cues throughout to improve technique, digital isolation and ease of performing task.    Rotate ball (ie. hacky sack) in fingertips (clockwise and counter-clockwise), rotating golf balls in hand with forearm supinated or on table top with forearm pronated.   Shuffling, dealing and flipping cards 1 at a time. -- Setup patient to work on shuffling cards, focusing on using B coordination and isolated finger movements.   Pick up coins, dominoes, buttons, marbles, dried beans/pasta of different sizes ... To place in containers To stack - with guidance to work on isolated finger movements. To pick up items one at a time until he gets 5+ in his hand and then move item from palm to fingertips to release.   - Options to vary difficulty include using a washcloth under items for increased visual regard or ease of picking up objects like coins or using larger items (checkers vs coins or blocks/dominos vs dice) for increased ease of picking up items.   Twirl pen/cil between fingers. - Encouragement to isolate fingers individually and flip the pen/cil or move fingers up and down pen to get it in position for writing or erasing.   Tear a piece of paper towel and roll it into small balls.     Patient is encourage to take breaks, work on isolated finger movements and try different activities throughout the day/week.   Initiated Putty Activities with  green putty to begin strengthening, coordination and sensory stimulation of B UEs.  Patient provided visual demonstration, verbal and tactile cues as needed to improve performance of the various exercises/activities including:   - Putty Squeezes - cues to squeeze putty into log for use with other exercises and to fold putty in half with 1 hand  - Putty Rolls - encourage to roll putty into logs with sensory stimulation to entire length of hand, fingers and wrist as needed   - Pinch and Pull with Putty - this motion is combined with different pinches (3-Point Pinch, Tip Pinch, Key Pinch) - patient encouraged to combine tripod, pincer and/or key pinch with "pinch and pull" motion of putty pulling away from midline, changing between different pinches and changing different directions to change grip   - Removing Objects from Putty  - need to review at next appt     PATIENT EDUCATION: Education details: OT goals, age norms for grip/coordination, Coordination/Putty Activities Person educated: Patient Education method: Explanation, Demonstration, Tactile cues, Verbal cues, and Handouts Education comprehension: verbalized understanding, returned demonstration, verbal cues required, tactile cues required, and needs further education  HOME EXERCISE PROGRAM: 12/05/22: Coordination with images; Putty Exercises Access Code: [email or web address]   GOALS: Goals reviewed with patient? Yes  SHORT TERM GOALS: Target date: 12/16/22  Patient will demonstrate BUE strength (theraband) and coordination HEP with 25% verbal cues or less for proper execution.  Baseline: New to OT Goal status: IN Progress  2.  Patient will demonstrate vision specific HEP with 25% verbal cues or less for proper execution.  Baseline: New to OT - L visual deficits Goal status:  INITIAL  3.  Patient will demonstrate at least 5-10 lbs improvement in grip strength as needed to open jars and other containers, carry multiple grocery bags etc. Baseline: Right: 59.0 lbs Left 62.1 lbs Goal status: IN Progress   LONG TERM GOALS: Target date: 01/06/23  Patient will independently recall at least 2 compensatory strategies for visual impairment without cueing. Baseline: L visual field cut with h/o falls r/t vision Goal status: INITIAL  2. Patient will return demonstration of visual adaptation use and verbalize understanding of how to obtain item(s) if desired.  Baseline: New to outpt OT Goal status: INITIAL  3.  Patient will complete nine-hole peg with use of B in less than 30 seconds.  Baseline: Right: 34.5 sec; Left: 33.7 sec Goal status: IN Progress  4.  Pt will verbalize understanding of ways to keep thinking skills sharp and ways to compensate for STM changes. Baseline: not yet initiated Goal status: IN Progress   ASSESSMENT:  CLINICAL IMPRESSION: Patient is a 27 y.o. male who was seen today for occupational therapy treatment following recent seizure activity. Patient was positioned to increase visual regard to L side during session with introduction to memory strategy (memory book) along with coordination HEP and putty HEP.   Pt will benefit from short further skilled OT services in the outpatient setting to improve strength, coordination and carryover of HEP.  PERFORMANCE DEFICITS: in functional skills including coordination, dexterity, proprioception, strength, Fine motor control, Gross motor control, decreased knowledge of use of DME, vision, and UE functional use, cognitive skills including emotional, memory, problem solving, and compensatory strategies (h/o drug use) , and psychosocial skills including coping strategies.   IMPAIRMENTS: are limiting patient from IADLs, work, leisure, and social participation.   CO-MORBIDITIES: has co-morbidities such as  seizures and visual deficits  that affects occupational performance. Patient will benefit from skilled OT  to address above impairments and improve overall function.  REHAB POTENTIAL: Excellent   PLAN:  OT FREQUENCY: 1-2x/week  OT DURATION: 6 weeks  PLANNED INTERVENTIONS: self care/ADL training, therapeutic exercise, therapeutic activity, neuromuscular re-education, patient/family education, cognitive remediation/compensation, visual/perceptual remediation/compensation, psychosocial skills training, coping strategies training, and DME and/or AE instructions  RECOMMENDED OTHER SERVICES: Patient received PT/ST   CONSULTED AND AGREED WITH PLAN OF CARE: Patient  PLAN FOR NEXT SESSIONS:  Review putty (hidden objects activity) Puzzle (weight bearing and visual scanning) Assess depth perception; Vision HEP Theraband exercises for strength and stability (standing) Review High level coordination activities for speed and dexterity and visual challenges Blaze Pods (visual distractor setting) ~ assess reaction time  Victorino Sparrow, OT 12/05/2022, 2:13 PM

## 2022-12-05 NOTE — Therapy (Signed)
OUTPATIENT PHYSICAL THERAPY NEURO TREATMENT   Patient Name: Stephen Beard MRN: [id number] DOB:02/25/1996, 27 y.o., male Today's Date: 12/05/2022   PCP: Morrell Riddle, PA-C   REFERRING PROVIDER:  Jacquelynn Cree, PA-C  END OF SESSION:  PT End of Session - 12/05/22 1149     Visit Number 3    Number of Visits 7    Date for PT Re-Evaluation 01/15/23    Authorization Type Medicare    PT Start Time 1145    PT Stop Time 1230    PT Time Calculation (min) 45 min    Equipment Utilized During Treatment Gait belt    Activity Tolerance Patient tolerated treatment well    Behavior During Therapy WFL for tasks assessed/performed               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HCC)    GAD (generalized anxiety disorder)    Headache    Intentional overdose (HCC) 12/2019   Psychotic disorder (HCC)    Schizoaffective disorder (HCC)    Seizures (HCC)    Substance abuse (HCC)    Suicidal ideation    Past Surgical History:  Procedure Laterality Date   APPLICATION OF CRANIAL NAVIGATION N/A 07/02/2019   Procedure: APPLICATION OF CRANIAL NAVIGATION;  Surgeon: Jadene Pierini, MD;  Location: [facility] OR;  Service: Neurosurgery;  Laterality: N/A;   CRANIOTOMY N/A 07/03/2019   Procedure: CRANIOTOMY HEMATOMA EVACUATION SUBDURAL;  Surgeon: Jadene Pierini, MD;  Location: [facility] OR;  Service: Neurosurgery;  Laterality: N/A;   CRANIOTOMY Right 07/03/2019   Procedure: CRANIOTOMY HEMATOMA EVACUATION SUBDURAL;  Surgeon: Jadene Pierini, MD;  Location: [facility] OR;  Service: Neurosurgery;  Laterality: Right;   CRANIOTOMY Right 07/02/2019   Procedure: CRANIOTOMY TUMOR EXCISION with Normajean Glasgow;  Surgeon: Jadene Pierini, MD;  Location: [facility] OR;  Service: Neurosurgery;  Laterality: Right;  posterior   Patient Active Problem List   Diagnosis Date Noted   Nontraumatic subdural hemorrhage, unspecified (HCC) 11/07/2022    Acute respiratory failure with hypoxia (HCC) 11/01/2022   AKI (acute kidney injury) (HCC) 11/01/2022   Headache 11/30/2021   Status epilepticus (HCC) 05/22/2021   Alcohol abuse 01/11/2020   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HCC) 01/07/2020   Intentional overdose of drug in tablet form (HCC) 01/05/2020   Drug overdose, multiple drugs, intentional self-harm, initial encounter (HCC) 01/04/2020   Schizoaffective disorder, depressive type (HCC) 01/01/2020   Generalized anxiety disorder 01/01/2020   Severe episode of recurrent major depressive disorder, with psychotic features (HCC) 01/01/2020   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)    Focal seizures (HCC) 11/07/2019   Auditory hallucinations 07/30/2019   Goals of care, counseling/discussion 07/19/2019   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HCC) 07/01/2019   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/01/2019   Brain mass 06/30/2019   Attention deficit hyperactivity disorder (ADHD), combined type 03/23/2019   Mild episode of recurrent major depressive disorder (HCC) 03/23/2019   Tobacco abuse 10/04/2012   Allergic rhinitis 08/18/2012    ONSET DATE: 11/11/2022  REFERRING DIAG: I62.00 (ICD-10-CM) - Nontraumatic subdural hemorrhage, unspecified  THERAPY DIAG:  Muscle weakness (generalized)  Unsteadiness on feet  Repeated falls  Other lack of coordination  Rationale for Evaluation and Treatment: Rehabilitation  SUBJECTIVE:  SUBJECTIVE STATEMENT: Pt reports he has been having frequent headaches in middle of his forehead, other than that no complaints of pain. The headaches have limited him from doing as much as he would like to. Pt states one of his biggest goals is stairs, also wants to continue working on  improving his balance.  Pt accompanied by: self  PERTINENT HISTORY: Pt presented on 10/31/22 with seizure >30 mins. EEG consistent with epilepsy and cortical dysfunction arising from R hemisphere, also moderate diffuse encephalopathy (same as EEG on admission a year ago). MRI showed trace SDH along the R parietal convexity. 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 No  PRECAUTIONS: Fall and Other: hx of seizures     WEIGHT BEARING RESTRICTIONS: No  FALLS: Has patient fallen in last 6 months? Yes. Number of falls 3 or 4, did not hurt himself   LIVING ENVIRONMENT: Lives with:  lives with mom  Lives in: House/apartment Stairs: No Has following equipment at home: None  PLOF: Independent with community mobility without device and has not driven since before his brain tumor surgery (2021)  PATIENT GOALS: Wants to have good balance again, wants to get his thighs good   OBJECTIVE:   DIAGNOSTIC FINDINGS: MRI brain 11/01/22 IMPRESSION: 1. Unchanged 6 mm focus of acute hemorrhage along the anterior margin of the right occipital resection cavity. No new or nodular enhancement to suggest local recurrence. 2. Trace subdural hemorrhage along the right parietal convexity.  COGNITION: Overall cognitive status: Within functional limits for tasks assessed and History of cognitive impairments - at baseline   SENSATION: Light touch: WFL and bilat LE  Sometimes will get numbness in L arm   COORDINATION: Nose to finger: Dysmetric LLE Heel to shin: WNL    POSTURE: No Significant postural limitations   LOWER EXTREMITY MMT:    MMT Right Eval Left Eval  Hip flexion 5 5  Hip extension    Hip abduction 4+ 4+  Hip adduction 4 4  Hip  internal rotation    Hip external rotation    Knee flexion 5 4+  Knee extension 4+ 4+  Ankle dorsiflexion 5 5  Ankle plantarflexion    Ankle inversion    Ankle eversion    (Blank rows = not tested)  All tested in sitting Noted incr tremors with hip ADD/ABD    TRANSFERS: Assistive device utilized: None  Sit to stand: Complete Independence Stand to sit: Complete Independence Noted pt having more weight through R side and RLE staggered posteriorly    STAIRS: Level of Assistance: SBA Stair Negotiation Technique: Alternating Pattern  Forwards with No Rails Single Rail on Right Number of Stairs: 4 x 2 reps = 8 total   Height of Stairs: 6  Comments: Pt slower to descend due to weakness, balance, and visual deficits when not using handrail   GAIT: Gait pattern: decreased arm swing- Left, decreased stride length, lateral hip instability, and decreased trunk rotation Distance walked: Clinic distances  Assistive device utilized: None Level of assistance: Modified independence Comments: No overt unsteadiness noted   FUNCTIONAL TESTS:  5 times sit to stand: 10.8 seconds with no UE support  10 meter walk test: 10.2 seconds = 3.22 ft/sec  SLS: RLE and LLE: 1-2 seconds       M-CTSIB  Condition 1:  Firm Surface, EO 30 Sec, Normal Sway  Condition 2: Firm Surface, EC 30 Sec, Normal Sway  Condition 3: Foam Surface, EO 30 Sec, Mild Sway  Condition 4: Foam Surface, EC 13 Sec       TODAY'S TREATMENT:                                                                                                                              NMR To work on dynamic standing balance in // bars with progression from BUE support to no UE support: Resisted monster walks forwards/backwards with green theraband 2 x 10 ft each direction Resisted lateral stepping with green theraband 3 x 10 ft L/R  To work on static standing balance in // bars with no UE support: On airex Romberg stance x 30 sec  each Added in multidirectional perturbations x 30 sec Added in EC x 30 sec Added in EC + multidirectional perturbations x 30 sec Rocker board A/P 3 x 30 sec each Added in rocking forwards then returning to neutral on board Added in rocking backwards then returning to neutral on board More difficulty with this (indicates more difficulty recovering balance with posterior LOB)   TherEx To work on core strengthening: Standing modified plank with BUE on edge of elevated mat table x 60 sec "easy" Decreased height of mat table for increased challenge x 60 sec "easy to medium" Feels a little unsteady after this Standard plank on mat table x 30 sec "medium"  At bottom of stairs to work on LE strengthening and coordination with no UE support: Alt L/R 6" step-ups x 10 reps B Alt L/R 6" eccentric step-downs x 10 reps B (with BUE support)   PATIENT EDUCATION: Education details: continue current exercises at home Person educated: Patient Education method: Medical illustrator Education comprehension: verbalized understanding and returned demonstration  HOME EXERCISE PROGRAM: Will provide at future session   GOALS: Goals reviewed with patient? Yes  SHORT TERM GOALS: ALL STGS = LTGS  LONG TERM GOALS: Target date: 12/14/2022  Pt will be independent with final HEP for strength/balance/gait in order to build upon functional gains made in therapy. Baseline:  Goal status: INITIAL  2.  Pt will improve FGA to at least a 29/30 in order to demo decr fall risk. Baseline: 26/30 Goal status: INITIAL  3.  Pt will perform 8 steps with no handrail and reciprocal pattern with mod I in order to demo improved balance.  Baseline:  Goal status: INITIAL  4.  Pt will improve condition 4 of mCTSIB to at least 25 seconds to demo improved vestibular input for balance.  Baseline: 13 seconds  Goal status: INITIAL  5.  Pt will improve gait speed with no to at least 3.5 ft/sec in order to demo  improved community mobility.  Baseline: 3.22 ft/sec Goal status: INITIAL   ASSESSMENT:  CLINICAL IMPRESSION: Emphasis of skilled PT session on continuing to work on static and dynamic balance  and functional core and LE strengthening . Pt exhibits improved balance on airex and on rockerboard this date with decreased use of hip strategy needed to catch his balance and with good control noted with added balance challenges on these surfaces. Pt continues to benefit from skilled therapy services to work towards improving his balance and his LE strength in order to decrease his fall risk. Continue POC.    OBJECTIVE IMPAIRMENTS: Abnormal gait, decreased activity tolerance, decreased balance, decreased cognition, decreased coordination, decreased strength, and impaired sensation.   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   REHAB POTENTIAL: Good  CLINICAL DECISION MAKING: Evolving/moderate complexity  EVALUATION COMPLEXITY: Moderate  PLAN:  PT FREQUENCY: 1-2x/week  PT DURATION: 8 weeks - due to delay in scheduling   PLANNED INTERVENTIONS: Therapeutic exercises, Therapeutic activity, Neuromuscular re-education, Balance training, Gait training, Patient/Family education, Self Care, Joint mobilization, Stair training, Vestibular training, Manual therapy, and Re-evaluation  PLAN FOR NEXT SESSION: Initiate HEP for LLE>RLE strength, balance with EC, compliant surfaces, narrow BOS. Work on high level balance and strengthening exercises. Pt wants to be more  steady when going up and down stairs , resisted gait, Blaze pods?, resisted step ups, lateral bounding    Peter Congo, PT, DPT, CSRS  12/05/2022, 12:33 PM

## 2022-12-05 NOTE — Patient Instructions (Addendum)
Planner:  Put sticky note on the page you need  Read aloud that date/time you have an appointment   When you have trouble saying the word you want to say:  1)  Describe it! Describe the size, color, shape, function, composition (what it's made of), and/or location to be able to have the word come sooner, or to have your listener help you out  2) "talk around the word" (say it a totally different way) -get your point out using different words than the one/ones you can't think of  3) Use a synonym - think of another word that means the exact same thing  4) DRAW! You can draw some things you want to say in order to give your listener a hint about what you're talking about  5)  Gesture- make motions to help your listener understand what you are trying to communicate  6) Write down the word, if you can - or the first letter or letters, to help you say the word or to give your listener a hint about what you're trying to say

## 2022-12-07 ENCOUNTER — Encounter: Payer: Self-pay | Admitting: Internal Medicine

## 2022-12-07 ENCOUNTER — Ambulatory Visit: Payer: Medicare Other

## 2022-12-07 ENCOUNTER — Ambulatory Visit: Payer: Medicare Other | Admitting: Occupational Therapy

## 2022-12-07 DIAGNOSIS — M6281 Muscle weakness (generalized): Secondary | ICD-10-CM

## 2022-12-07 DIAGNOSIS — R278 Other lack of coordination: Secondary | ICD-10-CM

## 2022-12-07 DIAGNOSIS — R41842 Visuospatial deficit: Secondary | ICD-10-CM

## 2022-12-07 DIAGNOSIS — R2689 Other abnormalities of gait and mobility: Secondary | ICD-10-CM

## 2022-12-07 DIAGNOSIS — R2681 Unsteadiness on feet: Secondary | ICD-10-CM

## 2022-12-07 NOTE — Therapy (Unsigned)
OUTPATIENT OCCUPATIONAL THERAPY NEURO TREATMENT  Patient Name: Stephen Beard MRN: [id number] DOB:08/13/1995, 27 y.o., male Today's Date: 12/07/2022  PCP: Morrell Riddle, PA-C REFERRING PROVIDER: Jacquelynn Cree, PA-C  END OF SESSION:  OT End of Session - 12/07/22 1445     Visit Number 3    Number of Visits 10    Date for OT Re-Evaluation 12/30/22    Authorization Type MEDICARE PART A AND B    OT Start Time 1445    OT Stop Time 1530    OT Time Calculation (min) 45 min    Equipment Utilized During Treatment Blaze pods, puzzle    Activity Tolerance Patient tolerated treatment well    Behavior During Therapy WFL for tasks assessed/performed             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HCC)    GAD (generalized anxiety disorder)    Headache    Intentional overdose (HCC) 12/2019   Psychotic disorder (HCC)    Schizoaffective disorder (HCC)    Seizures (HCC)    Substance abuse (HCC)    Suicidal ideation    Past Surgical History:  Procedure Laterality Date   APPLICATION OF CRANIAL NAVIGATION N/A 07/02/2019   Procedure: APPLICATION OF CRANIAL NAVIGATION;  Surgeon: Jadene Pierini, MD;  Location: [facility];  Service: Neurosurgery;  Laterality: N/A;   CRANIOTOMY N/A 07/03/2019   Procedure: CRANIOTOMY HEMATOMA EVACUATION SUBDURAL;  Surgeon: Jadene Pierini, MD;  Location: [facility];  Service: Neurosurgery;  Laterality: N/A;   CRANIOTOMY Right 07/03/2019   Procedure: CRANIOTOMY HEMATOMA EVACUATION SUBDURAL;  Surgeon: Jadene Pierini, MD;  Location: [facility];  Service: Neurosurgery;  Laterality: Right;   CRANIOTOMY Right 07/02/2019   Procedure: CRANIOTOMY TUMOR EXCISION with Normajean Glasgow;  Surgeon: Jadene Pierini, MD;  Location: [facility];  Service: Neurosurgery;  Laterality: Right;  posterior   Patient Active Problem List   Diagnosis Date Noted   Nontraumatic subdural hemorrhage, unspecified (HCC)  11/07/2022   Acute respiratory failure with hypoxia (HCC) 11/01/2022   AKI (acute kidney injury) (HCC) 11/01/2022   Headache 11/30/2021   Status epilepticus (HCC) 05/22/2021   Alcohol abuse 01/11/2020   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HCC) 01/07/2020   Intentional overdose of drug in tablet form (HCC) 01/05/2020   Drug overdose, multiple drugs, intentional self-harm, initial encounter (HCC) 01/04/2020   Schizoaffective disorder, depressive type (HCC) 01/01/2020   Generalized anxiety disorder 01/01/2020   Severe episode of recurrent major depressive disorder, with psychotic features (HCC) 01/01/2020   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)    Focal seizures (HCC) 11/07/2019   Auditory hallucinations 07/30/2019   Goals of care, counseling/discussion 07/19/2019   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HCC) 07/01/2019   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/01/2019   Brain mass 06/30/2019   Attention deficit hyperactivity disorder (ADHD), combined type 03/23/2019   Mild episode of recurrent major depressive disorder (HCC) 03/23/2019   Tobacco abuse 10/04/2012   Allergic rhinitis 08/18/2012    ONSET DATE: 11/15/22  REFERRING DIAG: I62.00 (ICD-10-CM) - Nontraumatic subdural hemorrhage, unspecified  THERAPY DIAG:  Muscle weakness (generalized)  Visuospatial deficit  Other lack of coordination  Rationale for Evaluation and Treatment: Rehabilitation  SUBJECTIVE:   SUBJECTIVE STATEMENT: Patient reports no headaches and he got some golf balls for his hand exercises.    Pt accompanied by: self  PERTINENT HISTORY:  Pt presented to the hospital on 10/31/22 with seizure >30 mins. EEG consistent with epilepsy and cortical dysfunction arising from R hemisphere, also moderate diffuse encephalopathy (same as EEG on admission a year ago). MRI showed trace SDH along the R parietal convexity.  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 No  FALLS: Has patient fallen in last 6 months? Yes. Number of falls maybe 1 in the past 6 months   He has fallen several times before that but relates it to his vision and not seeing something right. Has to go see an ophthalmologist   LIVING ENVIRONMENT: Lives with: lives with their family (Parents & uncle with DS) Lives in: Apartment Stairs: No Has following equipment at home: None  PLOF: Independent - worked at [employer] (prior to resection/seizures in 2021).  Pt has not driven since then due to seizures and visual deficits.  Prior to most recent seizure, he was not working but has been volunteering at [employer].   PATIENT GOALS: Patient reports he wants to work on his cognition ie) understanding directions; his vision; and his balance.  OBJECTIVE:   HAND DOMINANCE: Right  ADLs: Overall ADLs: Independent Transfers/ambulation related to ADLs: Independent Tub Shower transfers: MI - holds the wall for support Equipment: none  IADLs:  Driving: Not driving x 3 years due to seizures and vision; reports that he has been unable to use [employer] [place] due to transportation not coming to their home Shopping: Accompanies parent (mom & step dad) to go shopping Light housekeeping: Helps do his own laundry,  Meal Prep: Cooks a little - does not use the air fryer Community mobility: Ind Medication management: Takes his own medication but does report that he has missed some medication in the past Financial management: Not reported Handwriting:  Not tested  MOBILITY STATUS: Independent and Hx of falls  POSTURE COMMENTS:  No Significant postural limitations Sitting balance: WFL  ACTIVITY  TOLERANCE: Activity tolerance: Good  UPPER EXTREMITY ROM:    Active ROM Right eval Left eval  Shoulder flexion WFL   Shoulder abduction   Shoulder adduction   Shoulder extension   Shoulder internal rotation   Shoulder external rotation   Elbow flexion   Elbow extension   Wrist flexion   Wrist extension   Wrist ulnar deviation   Wrist radial deviation   Wrist pronation   Wrist supination   (Blank rows = not tested)  UPPER EXTREMITY MMT:     MMT Right eval Left eval  Shoulder flexion 4 4  Shoulder abduction    Shoulder adduction    Shoulder extension    Shoulder internal rotation    Shoulder external rotation    Middle trapezius    Lower trapezius    Elbow flexion 4 4  Elbow extension    Wrist flexion    Wrist extension    Wrist ulnar deviation    Wrist radial deviation    Wrist pronation    Wrist supination    (Blank rows = not tested)  HAND FUNCTION: Grip strength: Right: 59.3, 55.5, 62.3  lbs; Left: 60.8, 56.2, 69.4  lbs Average: Right: 59.0 lbs Left 62.1 lbs  COORDINATION: 9 Hole Peg test: Right: 34.5 sec; Left: 33.7 sec  SENSATION:  WFL  EDEMA: NA  MUSCLE TONE: WFL  COGNITION: Overall cognitive status: Within functional limits for tasks assessed  VISION: Subjective report: Patient can see distance better than up close. He does report need to see an ophthalmologist and reports that his L eye is worse than R Baseline vision: Wears glasses all the time Visual history:  Impaired since brain tumor.  Does report it was pressing on his L eye  VISION ASSESSMENT: Impaired Tracking/Visual pursuits: Decreased smoothness with horizontal tracking and especially towards the R side  Convergence ~ 12"  Inferior/superior R side intact, L side - full visual field cut for approaching objects from L side  Patient has difficulty with following activities due to following visual impairments: finding objects from L side   PERCEPTION: Not tested  PRAXIS:  Not tested  OBSERVATIONS: Pt ambulates with no AE with no loss of balance. The pt appears well kept and brought his new calendar where he had his upcoming therapy appts listed.     TODAY'S TREATMENT:                                                                                                                               Therapeutic Activities: Reviewed last putty exercises missed last visit -- Removing Objects from Putty  - pt is encouraged to conduct task with 1 hand at a time, and try to identify objects before removing them from putty.  OT placed 6 BlazePods in front of patient and had patient tap pods with palm of hand as pods lit up using distraction mode for improved processing, scanning and locating of items, reaction time, upper extremity range of motion, gross motor coordination, fine motor coordination, and especially visual regard     82 hits 1270 ms average reaction time (R hand) 87 hits 1181 ms average reaction time (L hand)  Pt was weight bearing through his opposite hand while conducting tasks and realized he was missing the bottom L pad while working with R hand, therefore became more aware when he was using his L hand and performed better on the 2nd attempt of task.   Also translated puzzle activity into visual and coordination task for patient as he enjoys puzzles and took home a 100 piece puzzle to work on at home.  After dumping pieces onto the table, activities included:  -turning all the pieces over with need to identify back and front of pieces and scan table top to find all the pieces off to the L side -picking up several pieces at a time with 1 hand -locating matching colored pieces OR finding edge pieces -putting pieces into zip lock bag but first having to open bag -strategically cleaning up pieces by sliding them off the L side of the table into puzzle box    PATIENT EDUCATION: Education details: Putty Activity and Puzzle for coordination/visual  scanning Person educated: Patient Education method: Explanation, Demonstration, and Verbal cues Education comprehension: verbalized understanding,  returned demonstration, verbal cues required, and needs further education  HOME EXERCISE PROGRAM: 12/05/22: Coordination with images; Putty Exercises Access Code: [email or web address]   GOALS: Goals reviewed with patient? Yes  SHORT TERM GOALS: Target date: 12/16/22  Patient will demonstrate BUE strength (theraband) and coordination HEP with 25% verbal cues or less for proper execution.  Baseline: New to OT Goal status: IN Progress  2.  Patient will demonstrate vision specific HEP with 25% verbal cues or less for proper execution.  Baseline: New to OT - L visual deficits Goal status: IN Progress  3.  Patient will demonstrate at least 5-10 lbs improvement in grip strength as needed to open jars and other containers, carry multiple grocery bags etc. Baseline: Right: 59.0 lbs Left 62.1 lbs Goal status: IN Progress   LONG TERM GOALS: Target date: 01/06/23  Patient will independently recall at least 2 compensatory strategies for visual impairment without cueing. Baseline: L visual field cut with h/o falls r/t vision Goal status: IN Progress  2. Patient will return demonstration of visual adaptation use and verbalize understanding of how to obtain item(s) if desired.  Baseline: New to outpt OT Goal status: IN Progress  3.  Patient will complete nine-hole peg with use of B in less than 30 seconds.  Baseline: Right: 34.5 sec; Left: 33.7 sec Goal status: IN Progress  4.  Pt will verbalize understanding of ways to keep thinking skills sharp and ways to compensate for STM changes. Baseline: not yet initiated Goal status: IN Progress   ASSESSMENT:  CLINICAL IMPRESSION: Patient is a 27 y.o. male who was seen today for occupational therapy treatment following recent seizure activity. Patient was engaged in activities to increase visual regard to L  side during session with introduction to using preferred leisure activity (puzzles) for coordination and visual scanning.   Pt will benefit from further skilled OT services in the outpatient setting to improve strength, coordination and carryover of HEP.  PERFORMANCE DEFICITS: in functional skills including coordination, dexterity, proprioception, strength, Fine motor control, Gross motor control, decreased knowledge of use of DME, vision, and UE functional use, cognitive skills including emotional, memory, problem solving, and compensatory strategies (h/o drug use) , and psychosocial skills including coping strategies.   IMPAIRMENTS: are limiting patient from IADLs, work, leisure, and social participation.   CO-MORBIDITIES: has co-morbidities such as seizures and visual deficits  that affects occupational performance. Patient will benefit from skilled OT to address above impairments and improve overall function.  REHAB POTENTIAL: Excellent   PLAN:  OT FREQUENCY: 1-2x/week  OT DURATION: 6 weeks  PLANNED INTERVENTIONS: self care/ADL training, therapeutic exercise, therapeutic activity, neuromuscular re-education, patient/family education, cognitive remediation/compensation, visual/perceptual remediation/compensation, psychosocial skills training, coping strategies training, and DME and/or AE instructions  RECOMMENDED OTHER SERVICES: Patient received PT/ST   CONSULTED AND AGREED WITH PLAN OF CARE: Patient  PLAN FOR NEXT SESSIONS:  Check how puzzle activity went at home (weight bearing and visual scanning) Assess depth perception; Vision HEP Theraband exercises for strength and stability (standing) Review High level coordination activities for speed and dexterity and visual challenges Blaze Pods (visual distractor setting) ~ porgress reaction time  Online Memory ideas  Victorino Sparrow, OT 12/07/2022, 4:49 PM

## 2022-12-07 NOTE — Therapy (Signed)
OUTPATIENT PHYSICAL THERAPY NEURO TREATMENT   Patient Name: Stephen Beard MRN: [id number] DOB:Jan 22, 1996, 27 y.o., male Today's Date: 12/07/2022   PCP: Morrell Riddle, PA-C   REFERRING PROVIDER:  Jacquelynn Cree, PA-C  END OF SESSION:  PT End of Session - 12/07/22 1404     Visit Number 4    Number of Visits 7    Date for PT Re-Evaluation 01/15/23    Authorization Type Medicare    PT Start Time 1402    PT Stop Time 1443    PT Time Calculation (min) 41 min    Equipment Utilized During Treatment Gait belt    Activity Tolerance Patient tolerated treatment well    Behavior During Therapy WFL for tasks assessed/performed               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HCC)    GAD (generalized anxiety disorder)    Headache    Intentional overdose (HCC) 12/2019   Psychotic disorder (HCC)    Schizoaffective disorder (HCC)    Seizures (HCC)    Substance abuse (HCC)    Suicidal ideation    Past Surgical History:  Procedure Laterality Date   APPLICATION OF CRANIAL NAVIGATION N/A 07/02/2019   Procedure: APPLICATION OF CRANIAL NAVIGATION;  Surgeon: Jadene Pierini, MD;  Location: [facility];  Service: Neurosurgery;  Laterality: N/A;   CRANIOTOMY N/A 07/03/2019   Procedure: CRANIOTOMY HEMATOMA EVACUATION SUBDURAL;  Surgeon: Jadene Pierini, MD;  Location: [facility];  Service: Neurosurgery;  Laterality: N/A;   CRANIOTOMY Right 07/03/2019   Procedure: CRANIOTOMY HEMATOMA EVACUATION SUBDURAL;  Surgeon: Jadene Pierini, MD;  Location: [facility];  Service: Neurosurgery;  Laterality: Right;   CRANIOTOMY Right 07/02/2019   Procedure: CRANIOTOMY TUMOR EXCISION with Normajean Glasgow;  Surgeon: Jadene Pierini, MD;  Location: [facility];  Service: Neurosurgery;  Laterality: Right;  posterior   Patient Active Problem List   Diagnosis Date Noted   Nontraumatic subdural hemorrhage, unspecified (HCC) 11/07/2022    Acute respiratory failure with hypoxia (HCC) 11/01/2022   AKI (acute kidney injury) (HCC) 11/01/2022   Headache 11/30/2021   Status epilepticus (HCC) 05/22/2021   Alcohol abuse 01/11/2020   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HCC) 01/07/2020   Intentional overdose of drug in tablet form (HCC) 01/05/2020   Drug overdose, multiple drugs, intentional self-harm, initial encounter (HCC) 01/04/2020   Schizoaffective disorder, depressive type (HCC) 01/01/2020   Generalized anxiety disorder 01/01/2020   Severe episode of recurrent major depressive disorder, with psychotic features (HCC) 01/01/2020   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)    Focal seizures (HCC) 11/07/2019   Auditory hallucinations 07/30/2019   Goals of care, counseling/discussion 07/19/2019   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HCC) 07/01/2019   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/01/2019   Brain mass 06/30/2019   Attention deficit hyperactivity disorder (ADHD), combined type 03/23/2019   Mild episode of recurrent major depressive disorder (HCC) 03/23/2019   Tobacco abuse 10/04/2012   Allergic rhinitis 08/18/2012    ONSET DATE: 11/11/2022  REFERRING DIAG: I62.00 (ICD-10-CM) - Nontraumatic subdural hemorrhage, unspecified  THERAPY DIAG:  Muscle weakness (generalized)  Other lack of coordination  Unsteadiness on feet  Other abnormalities of gait and mobility  Rationale for Evaluation and Treatment: Rehabilitation  SUBJECTIVE:  SUBJECTIVE STATEMENT: Patient reports doing well. Continues to report difficulty with stairs. Denies falls/near falls.   Pt accompanied by: self  PERTINENT HISTORY: Pt presented on 10/31/22 with seizure >30 mins. EEG consistent with epilepsy and cortical  dysfunction arising from R hemisphere, also moderate diffuse encephalopathy (same as EEG on admission a year ago). MRI showed trace SDH along the R parietal convexity. 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 No  PRECAUTIONS: Fall and Other: hx of seizures     WEIGHT BEARING RESTRICTIONS: No  FALLS: Has patient fallen in last 6 months? Yes. Number of falls 3 or 4, did not hurt himself   LIVING ENVIRONMENT: Lives with:  lives with mom  Lives in: House/apartment Stairs: No Has following equipment at home: None  PLOF: Independent with community mobility without device and has not driven since before his brain tumor surgery (2021)  PATIENT GOALS: Wants to have good balance again, wants to get his thighs good   OBJECTIVE:   DIAGNOSTIC FINDINGS: MRI brain 11/01/22 IMPRESSION: 1. Unchanged 6 mm focus of acute hemorrhage along the anterior margin of the right occipital resection cavity. No new or nodular enhancement to suggest local recurrence. 2. Trace subdural hemorrhage along the right parietal convexity.  COGNITION: Overall cognitive status: Within functional limits for tasks assessed and History of cognitive impairments - at baseline   SENSATION: Light touch: WFL and bilat LE  Sometimes will get numbness in L arm   COORDINATION: Nose to finger: Dysmetric LLE Heel to shin: WNL    POSTURE: No Significant postural limitations   LOWER EXTREMITY MMT:    MMT Right Eval Left Eval  Hip flexion 5 5  Hip extension    Hip abduction 4+ 4+  Hip adduction 4 4  Hip internal rotation    Hip external rotation    Knee flexion 5 4+  Knee extension 4+ 4+  Ankle dorsiflexion 5 5  Ankle plantarflexion    Ankle inversion     Ankle eversion    (Blank rows = not tested)  All tested in sitting Noted incr tremors with hip ADD/ABD    TRANSFERS: Assistive device utilized: None  Sit to stand: Complete Independence Stand to sit: Complete Independence Noted pt having more weight through R side and RLE staggered posteriorly    STAIRS: Level of Assistance: SBA Stair Negotiation Technique: Alternating Pattern  Forwards with No Rails Single Rail on Right Number of Stairs: 4 x 2 reps = 8 total   Height of Stairs: 6  Comments: Pt slower to descend due to weakness, balance, and visual deficits when not using handrail   GAIT: Gait pattern: decreased arm swing- Left, decreased stride length, lateral hip instability, and decreased trunk rotation Distance walked: Clinic distances  Assistive device utilized: None Level of assistance: Modified independence Comments: No overt unsteadiness noted   FUNCTIONAL TESTS:  5 times sit to stand: 10.8 seconds with no UE support  10 meter walk test: 10.2 seconds = 3.22 ft/sec  SLS: RLE and LLE: 1-2 seconds       M-CTSIB  Condition 1: Firm Surface, EO 30 Sec, Normal Sway  Condition 2: Firm Surface, EC 30 Sec, Normal Sway  Condition 3: Foam Surface, EO 30 Sec, Mild Sway  Condition 4: Foam Surface, EC 13 Sec  TODAY'S TREATMENT:                                                                                                                              NMR -blaze pods on stairs step up/down B HR + CGA   -required consistent cues for directions to complete accurately  -3x2 mins  -blaze pods modified D1 4# B ankle weights to 2nd step with heel contact   2x51mins -slam ball squats x8 10# slamball   PATIENT EDUCATION: Education details: continue current exercises at home Person educated: Patient Education method: Medical illustrator Education comprehension: verbalized understanding and returned demonstration  HOME EXERCISE PROGRAM: Will provide at  future session   GOALS: Goals reviewed with patient? Yes  SHORT TERM GOALS: ALL STGS = LTGS  LONG TERM GOALS: Target date: 12/14/2022  Pt will be independent with final HEP for strength/balance/gait in order to build upon functional gains made in therapy. Baseline:  Goal status: INITIAL  2.  Pt will improve FGA to at least a 29/30 in order to demo decr fall risk. Baseline: 26/30 Goal status: INITIAL  3.  Pt will perform 8 steps with no handrail and reciprocal pattern with mod I in order to demo improved balance.  Baseline:  Goal status: INITIAL  4.  Pt will improve condition 4 of mCTSIB to at least 25 seconds to demo improved vestibular input for balance.  Baseline: 13 seconds  Goal status: INITIAL  5.  Pt will improve gait speed with no to at least 3.5 ft/sec in order to demo improved community mobility.  Baseline: 3.22 ft/sec Goal status: INITIAL   ASSESSMENT:  CLINICAL IMPRESSION: Patient seen for skilled PT session with emphasis on gross NMR with focus on stair negotiation and single leg balance to do so. Tolerated dynamic challenge well, but did require consistent cuing to complete task accurately. Continue POC.    OBJECTIVE IMPAIRMENTS: Abnormal gait, decreased activity tolerance, decreased balance, decreased cognition, decreased coordination, decreased strength, and impaired sensation.   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   REHAB POTENTIAL: Good  CLINICAL DECISION MAKING: Evolving/moderate complexity  EVALUATION  COMPLEXITY: Moderate  PLAN:  PT FREQUENCY: 1-2x/week  PT DURATION: 8 weeks - due to delay in scheduling   PLANNED INTERVENTIONS: Therapeutic exercises, Therapeutic activity, Neuromuscular re-education, Balance training, Gait training, Patient/Family education, Self Care, Joint mobilization, Stair training, Vestibular training, Manual therapy, and Re-evaluation  PLAN FOR NEXT SESSION: Initiate HEP for LLE>RLE strength, balance with EC, compliant surfaces, narrow BOS. Work on high level balance and strengthening exercises. Pt wants to be more steady when going up and down stairs , resisted gait, Blaze pods?, resisted step  ups, lateral bounding    Westley Foots, PT, DPT, CBIS  12/07/2022, 2:47 PM

## 2022-12-12 ENCOUNTER — Ambulatory Visit: Payer: Medicare Other | Admitting: Physical Therapy

## 2022-12-12 ENCOUNTER — Ambulatory Visit: Payer: Medicare Other | Admitting: Occupational Therapy

## 2022-12-12 DIAGNOSIS — R278 Other lack of coordination: Secondary | ICD-10-CM

## 2022-12-12 DIAGNOSIS — R2681 Unsteadiness on feet: Secondary | ICD-10-CM

## 2022-12-12 DIAGNOSIS — R2689 Other abnormalities of gait and mobility: Secondary | ICD-10-CM

## 2022-12-12 DIAGNOSIS — M6281 Muscle weakness (generalized): Secondary | ICD-10-CM

## 2022-12-12 NOTE — Therapy (Signed)
OUTPATIENT PHYSICAL THERAPY NEURO TREATMENT   Patient Name: Stephen Beard MRN: [id number] DOB:September 14, 1995, 27 y.o., male Today's Date: 12/12/2022   PCP: Morrell Riddle, PA-C   REFERRING PROVIDER:  Jacquelynn Cree, PA-C  END OF SESSION:  PT End of Session - 12/12/22 1145     Visit Number 5    Number of Visits 7    Date for PT Re-Evaluation 01/15/23    Authorization Type Medicare    PT Start Time 1145    PT Stop Time 1232    PT Time Calculation (min) 47 min    Equipment Utilized During Treatment Gait belt    Activity Tolerance Patient tolerated treatment well    Behavior During Therapy WFL for tasks assessed/performed                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HCC)    GAD (generalized anxiety disorder)    Headache    Intentional overdose (HCC) 12/2019   Psychotic disorder (HCC)    Schizoaffective disorder (HCC)    Seizures (HCC)    Substance abuse (HCC)    Suicidal ideation    Past Surgical History:  Procedure Laterality Date   APPLICATION OF CRANIAL NAVIGATION N/A 07/02/2019   Procedure: APPLICATION OF CRANIAL NAVIGATION;  Surgeon: Jadene Pierini, MD;  Location: [facility] OR;  Service: Neurosurgery;  Laterality: N/A;   CRANIOTOMY N/A 07/03/2019   Procedure: CRANIOTOMY HEMATOMA EVACUATION SUBDURAL;  Surgeon: Jadene Pierini, MD;  Location: [facility] OR;  Service: Neurosurgery;  Laterality: N/A;   CRANIOTOMY Right 07/03/2019   Procedure: CRANIOTOMY HEMATOMA EVACUATION SUBDURAL;  Surgeon: Jadene Pierini, MD;  Location: [facility] OR;  Service: Neurosurgery;  Laterality: Right;   CRANIOTOMY Right 07/02/2019   Procedure: CRANIOTOMY TUMOR EXCISION with Normajean Glasgow;  Surgeon: Jadene Pierini, MD;  Location: [facility] OR;  Service: Neurosurgery;  Laterality: Right;  posterior   Patient Active Problem List   Diagnosis Date Noted   Nontraumatic subdural hemorrhage, unspecified (HCC) 11/07/2022    Acute respiratory failure with hypoxia (HCC) 11/01/2022   AKI (acute kidney injury) (HCC) 11/01/2022   Headache 11/30/2021   Status epilepticus (HCC) 05/22/2021   Alcohol abuse 01/11/2020   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HCC) 01/07/2020   Intentional overdose of drug in tablet form (HCC) 01/05/2020   Drug overdose, multiple drugs, intentional self-harm, initial encounter (HCC) 01/04/2020   Schizoaffective disorder, depressive type (HCC) 01/01/2020   Generalized anxiety disorder 01/01/2020   Severe episode of recurrent major depressive disorder, with psychotic features (HCC) 01/01/2020   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)    Focal seizures (HCC) 11/07/2019   Auditory hallucinations 07/30/2019   Goals of care, counseling/discussion 07/19/2019   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HCC) 07/01/2019   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/01/2019   Brain mass 06/30/2019   Attention deficit hyperactivity disorder (ADHD), combined type 03/23/2019   Mild episode of recurrent major depressive disorder (HCC) 03/23/2019   Tobacco abuse 10/04/2012   Allergic rhinitis 08/18/2012    ONSET DATE: 11/11/2022  REFERRING DIAG: I62.00 (ICD-10-CM) - Nontraumatic subdural hemorrhage, unspecified  THERAPY DIAG:  Muscle weakness (generalized)  Other lack of coordination  Unsteadiness on feet  Other abnormalities of gait and mobility  Rationale for Evaluation and Treatment: Rehabilitation  SUBJECTIVE:  SUBJECTIVE STATEMENT: Patient reports doing well, has done some walking, stairs, and squats for exercise. Slept on left side last night and was sore in morning but feels OK now. Denies falls/near falls. Feels as though his running is off and cannot practice this  activity at home.    Pt accompanied by: self  PERTINENT HISTORY: Pt presented on 10/31/22 with seizure >30 mins. EEG consistent with epilepsy and cortical dysfunction arising from R hemisphere, also moderate diffuse encephalopathy (same as EEG on admission a year ago). MRI showed trace SDH along the R parietal convexity. 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  Just a little sore in LLE, no pain    PRECAUTIONS: Fall and Other: hx of seizures     WEIGHT BEARING RESTRICTIONS: No  FALLS: Has patient fallen in last 6 months? Yes. Number of falls 3 or 4, did not hurt himself   LIVING ENVIRONMENT: Lives with:  lives with mom  Lives in: House/apartment Stairs: No Has following equipment at home: None  PLOF: Independent with community mobility without device and has not driven since before his brain tumor surgery (2021)  PATIENT GOALS: Wants to have good balance again, wants to get his thighs good   OBJECTIVE:   DIAGNOSTIC FINDINGS: MRI brain 11/01/22 IMPRESSION: 1. Unchanged 6 mm focus of acute hemorrhage along the anterior margin of the right occipital resection cavity. No new or nodular enhancement to suggest local recurrence. 2. Trace subdural hemorrhage along the right parietal convexity.  COGNITION: Overall cognitive status: Within functional limits for tasks assessed and History of cognitive impairments - at baseline   SENSATION: Light touch: WFL and bilat LE  Sometimes will get numbness in L arm   COORDINATION: Nose to finger: Dysmetric LLE Heel to shin: WNL    POSTURE: No Significant postural limitations   LOWER EXTREMITY MMT:    MMT Right Eval Left Eval  Hip flexion 5 5  Hip extension    Hip abduction 4+  4+  Hip adduction 4 4  Hip internal rotation    Hip external rotation    Knee flexion 5 4+  Knee extension 4+ 4+  Ankle dorsiflexion 5 5  Ankle plantarflexion    Ankle inversion    Ankle eversion    (Blank rows = not tested)  All tested in sitting Noted incr tremors with hip ADD/ABD    TRANSFERS: Assistive device utilized: None  Sit to stand: Complete Independence Stand to sit: Complete Independence Noted pt having more weight through R side and RLE staggered posteriorly    STAIRS: Level of Assistance: SBA Stair Negotiation Technique: Alternating Pattern  Forwards with No Rails Single Rail on Right Number of Stairs: 4 x 2 reps = 8 total   Height of Stairs: 6  Comments: Pt slower to descend due to weakness, balance, and visual deficits when not using handrail   GAIT: Gait pattern: decreased arm swing- Left, decreased stride length, lateral hip instability, and decreased trunk rotation Distance walked: Clinic distances  Assistive device utilized: None Level of assistance: Modified independence Comments: No overt unsteadiness noted   FUNCTIONAL TESTS:  5 times sit to stand: 10.8 seconds with no UE support  10 meter walk test: 10.2 seconds = 3.22 ft/sec  SLS: RLE and LLE: 1-2 seconds  M-CTSIB  Condition 1: Firm Surface, EO 30 Sec, Normal Sway  Condition 2: Firm Surface, EC 30 Sec, Normal Sway  Condition 3: Foam Surface, EO 30 Sec, Mild Sway  Condition 4: Foam Surface, EC 13 Sec       TODAY'S TREATMENT:                                                                                                                              [employer] with close SBA Forwards and Backwards x2 repetitions of 20 ft with green theraband at ankles Educated to keep about one fists worth of space between thighs and take small steps to keep tension on band Educated to perform at home by [employer] for balance Lateral x2 repetitions of 20 ft with green theraband  at ankles bilateral leading limbs Educated to step big with leading limb and small and controlled with following limb to keep tension on band Educated to perform at home by [employer] for balance  Standing Terminal Knee Extension x 20 repetitions with green theraband, only on L Educated to set up at home by closing knot in a door at knee height as well as keeping a sturdy object like a chair nearby for balance  Forward Step-Touch x 2 minutes to 6" step Educated to use hands as necessary for balance, perform at apartment stairs or stepstool  Discussed performing exercises for time vs repetitions, pt believes setting a timer will be beneficial for home  Standing Hip Flexor Stretch x10 bilaterally with close SBA Educated to perform near a couch or countertop for balance at home Verbal cues needed for coordination of alternating lower extremities and bending knees  Full Plank x2 repetitions x30 second hold Tactile and verbal cues for hand and shoulder alignment and keeping a neutral head and back  Bird Dogs x10 repetitions bilaterally Verbal cues for coordination of opposite arm and leg moving together  Side lying Clamshells x10 repetitions bilaterally Verbal and tactile cues to keep hips rolled forwards  Staggered Sit-to-Stand with LLE back for increased strengthening and weightbearing through LLE, x10 repetitions with supervision  Romberg Stance Eyes Closed on Foam Pad x2 repetitions x30 seconds Second repetition performed on pillow to imitate home, educated can modify to make harder by having folded towels or blankets under feet instead Educated to perform in corner with a chair or other sturdy furniture in front for balance and tactile cues if going too far outside of base of support   PATIENT EDUCATION:  Education details: initiated HEP see bolded below Person educated: Patient Education method: Programmer, multimedia, Demonstration, Tactile cues, Verbal cues, and Handouts Education  comprehension: verbalized understanding, returned demonstration, verbal cues required, tactile cues required, and needs further education  HOME EXERCISE PROGRAM:  Access Code: [email or web address] URL: [email or web address] Date: 12/12/2022 Prepared by: Beverely Low  Exercises - Side Stepping with Resistance at Ankles  - 1 x daily - 7 x weekly - 1 sets -  3 reps - Reverse Band Walks  - 1 x daily - 7 x weekly - 1 sets - 3 reps - Band Walks  - 1 x daily - 7 x weekly - 1 sets - 3 reps - Standing Terminal Knee Extension with Resistance  - 1 x daily - 7 x weekly - 3 sets - 10 reps - Standing Hip Flexor Stretch  - 1 x daily - 7 x weekly - 1 sets - 10 reps - Romberg Stance Eyes Closed on Foam Pad  - 1 x daily - 7 x weekly - 1 sets - 3 reps - 30 hold - Full Plank  - 1 x daily - 7 x weekly - 3 reps - 30 hold - Bird Dog  - 1 x daily - 7 x weekly - 1 sets - 10 reps - Clamshell  - 1 x daily - 7 x weekly - 2 sets - 10 reps - Staggered Sit-to-Stand  - 1 x daily - 7 x weekly - 2 sets - 10 reps - Forward Step Touch  - 1 x daily - 7 x weekly - 3 sets - 10 reps    GOALS: Goals reviewed with patient? Yes  SHORT TERM GOALS: ALL STGS = LTGS  LONG TERM GOALS: Target date: 12/14/2022  Pt will be independent with final HEP for strength/balance/gait in order to build upon functional gains made in therapy. Baseline:  Goal status: INITIAL  2.  Pt will improve FGA to at least a 29/30 in order to demo decr fall risk. Baseline: 26/30 Goal status: INITIAL  3.  Pt will perform 8 steps with no handrail and reciprocal pattern with mod I in order to demo improved balance.  Baseline:  Goal status: INITIAL  4.  Pt will improve condition 4 of mCTSIB to at least 25 seconds to demo improved vestibular input for balance.  Baseline: 13 seconds  Goal status: INITIAL  5.  Pt will improve gait speed with no to at least 3.5 ft/sec in order to demo improved community mobility.  Baseline: 3.22 ft/sec Goal status:  INITIAL   ASSESSMENT:  CLINICAL IMPRESSION: Patient seen for skilled PT session with emphasis on initiating HEP for LLE and core strengthening, flexibility, coordination, and balance (see bolded above). Patient tolerated exercises well and expressed understanding education on how and when to perform the HEP. Continue POC.    OBJECTIVE IMPAIRMENTS: Abnormal gait, decreased activity tolerance, decreased balance, decreased cognition, decreased coordination, decreased strength, and impaired sensation.   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   REHAB POTENTIAL: Good  CLINICAL DECISION MAKING: Evolving/moderate complexity  EVALUATION COMPLEXITY: Moderate  PLAN:  PT FREQUENCY: 1-2x/week  PT DURATION: 8 weeks - due to delay in scheduling   PLANNED INTERVENTIONS: Therapeutic exercises, Therapeutic activity, Neuromuscular re-education, Balance training, Gait training, Patient/Family education, Self Care, Joint mobilization, Stair training, Vestibular training, Manual therapy, and Re-evaluation  PLAN FOR NEXT SESSION:  LLE>RLE strength, balance with EC, compliant surfaces, narrow BOS. Work on high level balance and strengthening exercises. Pt wants to be more steady when going up and down stairs , resisted gait, Blaze pods?, resisted step ups, lateral bounding, How did HEP go? Assess running outside vs treadmill  Beverely Low, SPT   12/12/2022, 1:00 PM

## 2022-12-14 ENCOUNTER — Ambulatory Visit: Payer: Medicare Other | Attending: Physical Medicine and Rehabilitation | Admitting: Occupational Therapy

## 2022-12-14 ENCOUNTER — Ambulatory Visit: Payer: Medicare Other

## 2022-12-14 DIAGNOSIS — M6281 Muscle weakness (generalized): Secondary | ICD-10-CM | POA: Insufficient documentation

## 2022-12-14 DIAGNOSIS — R41840 Attention and concentration deficit: Secondary | ICD-10-CM | POA: Insufficient documentation

## 2022-12-14 DIAGNOSIS — R41842 Visuospatial deficit: Secondary | ICD-10-CM | POA: Insufficient documentation

## 2022-12-14 DIAGNOSIS — R278 Other lack of coordination: Secondary | ICD-10-CM | POA: Insufficient documentation

## 2022-12-14 DIAGNOSIS — R2681 Unsteadiness on feet: Secondary | ICD-10-CM | POA: Insufficient documentation

## 2022-12-14 DIAGNOSIS — R2689 Other abnormalities of gait and mobility: Secondary | ICD-10-CM | POA: Insufficient documentation

## 2022-12-14 DIAGNOSIS — R41844 Frontal lobe and executive function deficit: Secondary | ICD-10-CM | POA: Insufficient documentation

## 2022-12-14 DIAGNOSIS — R41841 Cognitive communication deficit: Secondary | ICD-10-CM | POA: Insufficient documentation

## 2022-12-14 NOTE — Patient Instructions (Signed)

## 2022-12-14 NOTE — Therapy (Signed)
OUTPATIENT SPEECH LANGUAGE PATHOLOGY TREATMENT   Patient Name: Stephen Beard MRN: [id number] DOB:[birth year], 27 y.o., male Today's Date: 12/14/2022  PCP: Virgilio Belling REFERRING PROVIDER: Jacquelynn Cree, PA-C  END OF SESSION:  End of Session - 12/14/22 1018     Visit Number 4    Number of Visits 17    Date for SLP Re-Evaluation 01/11/23    SLP Start Time 1019    SLP Stop Time  1100    SLP Time Calculation (min) 41 min    Activity Tolerance Patient tolerated treatment well                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HCC)    GAD (generalized anxiety disorder)    Headache    Intentional overdose (HCC) 12/2019   Psychotic disorder (HCC)    Schizoaffective disorder (HCC)    Seizures (HCC)    Substance abuse (HCC)    Suicidal ideation    Past Surgical History:  Procedure Laterality Date   APPLICATION OF CRANIAL NAVIGATION N/A 07/02/2019   Procedure: APPLICATION OF CRANIAL NAVIGATION;  Surgeon: Jadene Pierini, MD;  Location: [facility];  Service: Neurosurgery;  Laterality: N/A;   CRANIOTOMY N/A 07/03/2019   Procedure: CRANIOTOMY HEMATOMA EVACUATION SUBDURAL;  Surgeon: Jadene Pierini, MD;  Location: [facility];  Service: Neurosurgery;  Laterality: N/A;   CRANIOTOMY Right 07/03/2019   Procedure: CRANIOTOMY HEMATOMA EVACUATION SUBDURAL;  Surgeon: Jadene Pierini, MD;  Location: [facility];  Service: Neurosurgery;  Laterality: Right;   CRANIOTOMY Right 07/02/2019   Procedure: CRANIOTOMY TUMOR EXCISION with Normajean Glasgow;  Surgeon: Jadene Pierini, MD;  Location: [facility];  Service: Neurosurgery;  Laterality: Right;  posterior   Patient Active Problem List   Diagnosis Date Noted   Nontraumatic subdural hemorrhage, unspecified (HCC) 11/07/2022   Acute respiratory failure with hypoxia (HCC) 11/01/2022   AKI (acute kidney injury) (HCC) 11/01/2022   Headache 11/30/2021   Status epilepticus  (HCC) 05/22/2021   Alcohol abuse 01/11/2020   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HCC) 01/07/2020   Intentional overdose of drug in tablet form (HCC) 01/05/2020   Drug overdose, multiple drugs, intentional self-harm, initial encounter (HCC) 01/04/2020   Schizoaffective disorder, depressive type (HCC) 01/01/2020   Generalized anxiety disorder 01/01/2020   Severe episode of recurrent major depressive disorder, with psychotic features (HCC) 01/01/2020   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)    Focal seizures (HCC) 11/07/2019   Auditory hallucinations 07/30/2019   Goals of care, counseling/discussion 07/19/2019   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HCC) 07/01/2019   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/01/2019   Brain mass 06/30/2019   Attention deficit hyperactivity disorder (ADHD), combined type 03/23/2019   Mild episode of recurrent major depressive disorder (HCC) 03/23/2019   Tobacco abuse 10/04/2012   Allergic rhinitis 08/18/2012    ONSET DATE: 11/10/2022- referral date; 11/01/22 hospitalization   REFERRING DIAG: G40.901 (ICD-10-CM) - Status epilepticus (HCC) C71.4 (ICD-10-CM) - 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HCC) I62.00 (ICD-10-CM) - Nontraumatic subdural hemorrhage, unspecified (HCC)  THERAPY DIAG: Cognitive communication deficit  Rationale for Evaluation and Treatment: Rehabilitation  SUBJECTIVE:   SUBJECTIVE STATEMENT: "I dream of getting a job or going back to school." Pt accompanied by: self  PERTINENT HISTORY: 27 year old male with past medical history as below, which is significant for anaplastic oligodendroglioma s/p resection and chemotherapy. He has had seizures since  OUTPATIENT SPEECH LANGUAGE PATHOLOGY TREATMENT   Patient Name: Stephen Beard MRN: [id number] DOB:[birth year], 27 y.o., male Today's Date: 12/14/2022  PCP: Virgilio Belling REFERRING PROVIDER: Jacquelynn Cree, PA-C  END OF SESSION:  End of Session - 12/14/22 1018     Visit Number 4    Number of Visits 17    Date for SLP Re-Evaluation 01/11/23    SLP Start Time 1019    SLP Stop Time  1100    SLP Time Calculation (min) 41 min    Activity Tolerance Patient tolerated treatment well                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HCC)    GAD (generalized anxiety disorder)    Headache    Intentional overdose (HCC) 12/2019   Psychotic disorder (HCC)    Schizoaffective disorder (HCC)    Seizures (HCC)    Substance abuse (HCC)    Suicidal ideation    Past Surgical History:  Procedure Laterality Date   APPLICATION OF CRANIAL NAVIGATION N/A 07/02/2019   Procedure: APPLICATION OF CRANIAL NAVIGATION;  Surgeon: Jadene Pierini, MD;  Location: [facility];  Service: Neurosurgery;  Laterality: N/A;   CRANIOTOMY N/A 07/03/2019   Procedure: CRANIOTOMY HEMATOMA EVACUATION SUBDURAL;  Surgeon: Jadene Pierini, MD;  Location: [facility];  Service: Neurosurgery;  Laterality: N/A;   CRANIOTOMY Right 07/03/2019   Procedure: CRANIOTOMY HEMATOMA EVACUATION SUBDURAL;  Surgeon: Jadene Pierini, MD;  Location: [facility];  Service: Neurosurgery;  Laterality: Right;   CRANIOTOMY Right 07/02/2019   Procedure: CRANIOTOMY TUMOR EXCISION with Normajean Glasgow;  Surgeon: Jadene Pierini, MD;  Location: [facility];  Service: Neurosurgery;  Laterality: Right;  posterior   Patient Active Problem List   Diagnosis Date Noted   Nontraumatic subdural hemorrhage, unspecified (HCC) 11/07/2022   Acute respiratory failure with hypoxia (HCC) 11/01/2022   AKI (acute kidney injury) (HCC) 11/01/2022   Headache 11/30/2021   Status epilepticus  (HCC) 05/22/2021   Alcohol abuse 01/11/2020   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HCC) 01/07/2020   Intentional overdose of drug in tablet form (HCC) 01/05/2020   Drug overdose, multiple drugs, intentional self-harm, initial encounter (HCC) 01/04/2020   Schizoaffective disorder, depressive type (HCC) 01/01/2020   Generalized anxiety disorder 01/01/2020   Severe episode of recurrent major depressive disorder, with psychotic features (HCC) 01/01/2020   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)    Focal seizures (HCC) 11/07/2019   Auditory hallucinations 07/30/2019   Goals of care, counseling/discussion 07/19/2019   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HCC) 07/01/2019   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/01/2019   Brain mass 06/30/2019   Attention deficit hyperactivity disorder (ADHD), combined type 03/23/2019   Mild episode of recurrent major depressive disorder (HCC) 03/23/2019   Tobacco abuse 10/04/2012   Allergic rhinitis 08/18/2012    ONSET DATE: 11/10/2022- referral date; 11/01/22 hospitalization   REFERRING DIAG: G40.901 (ICD-10-CM) - Status epilepticus (HCC) C71.4 (ICD-10-CM) - 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HCC) I62.00 (ICD-10-CM) - Nontraumatic subdural hemorrhage, unspecified (HCC)  THERAPY DIAG: Cognitive communication deficit  Rationale for Evaluation and Treatment: Rehabilitation  SUBJECTIVE:   SUBJECTIVE STATEMENT: "I dream of getting a job or going back to school." Pt accompanied by: self  PERTINENT HISTORY: 27 year old male with past medical history as below, which is significant for anaplastic oligodendroglioma s/p resection and chemotherapy. He has had seizures since  OUTPATIENT SPEECH LANGUAGE PATHOLOGY TREATMENT   Patient Name: Stephen Beard MRN: [id number] DOB:[birth year], 27 y.o., male Today's Date: 12/14/2022  PCP: Virgilio Belling REFERRING PROVIDER: Jacquelynn Cree, PA-C  END OF SESSION:  End of Session - 12/14/22 1018     Visit Number 4    Number of Visits 17    Date for SLP Re-Evaluation 01/11/23    SLP Start Time 1019    SLP Stop Time  1100    SLP Time Calculation (min) 41 min    Activity Tolerance Patient tolerated treatment well                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HCC)    GAD (generalized anxiety disorder)    Headache    Intentional overdose (HCC) 12/2019   Psychotic disorder (HCC)    Schizoaffective disorder (HCC)    Seizures (HCC)    Substance abuse (HCC)    Suicidal ideation    Past Surgical History:  Procedure Laterality Date   APPLICATION OF CRANIAL NAVIGATION N/A 07/02/2019   Procedure: APPLICATION OF CRANIAL NAVIGATION;  Surgeon: Jadene Pierini, MD;  Location: [facility];  Service: Neurosurgery;  Laterality: N/A;   CRANIOTOMY N/A 07/03/2019   Procedure: CRANIOTOMY HEMATOMA EVACUATION SUBDURAL;  Surgeon: Jadene Pierini, MD;  Location: [facility];  Service: Neurosurgery;  Laterality: N/A;   CRANIOTOMY Right 07/03/2019   Procedure: CRANIOTOMY HEMATOMA EVACUATION SUBDURAL;  Surgeon: Jadene Pierini, MD;  Location: [facility];  Service: Neurosurgery;  Laterality: Right;   CRANIOTOMY Right 07/02/2019   Procedure: CRANIOTOMY TUMOR EXCISION with Normajean Glasgow;  Surgeon: Jadene Pierini, MD;  Location: [facility];  Service: Neurosurgery;  Laterality: Right;  posterior   Patient Active Problem List   Diagnosis Date Noted   Nontraumatic subdural hemorrhage, unspecified (HCC) 11/07/2022   Acute respiratory failure with hypoxia (HCC) 11/01/2022   AKI (acute kidney injury) (HCC) 11/01/2022   Headache 11/30/2021   Status epilepticus  (HCC) 05/22/2021   Alcohol abuse 01/11/2020   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HCC) 01/07/2020   Intentional overdose of drug in tablet form (HCC) 01/05/2020   Drug overdose, multiple drugs, intentional self-harm, initial encounter (HCC) 01/04/2020   Schizoaffective disorder, depressive type (HCC) 01/01/2020   Generalized anxiety disorder 01/01/2020   Severe episode of recurrent major depressive disorder, with psychotic features (HCC) 01/01/2020   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)    Focal seizures (HCC) 11/07/2019   Auditory hallucinations 07/30/2019   Goals of care, counseling/discussion 07/19/2019   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HCC) 07/01/2019   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/01/2019   Brain mass 06/30/2019   Attention deficit hyperactivity disorder (ADHD), combined type 03/23/2019   Mild episode of recurrent major depressive disorder (HCC) 03/23/2019   Tobacco abuse 10/04/2012   Allergic rhinitis 08/18/2012    ONSET DATE: 11/10/2022- referral date; 11/01/22 hospitalization   REFERRING DIAG: G40.901 (ICD-10-CM) - Status epilepticus (HCC) C71.4 (ICD-10-CM) - 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HCC) I62.00 (ICD-10-CM) - Nontraumatic subdural hemorrhage, unspecified (HCC)  THERAPY DIAG: Cognitive communication deficit  Rationale for Evaluation and Treatment: Rehabilitation  SUBJECTIVE:   SUBJECTIVE STATEMENT: "I dream of getting a job or going back to school." Pt accompanied by: self  PERTINENT HISTORY: 27 year old male with past medical history as below, which is significant for anaplastic oligodendroglioma s/p resection and chemotherapy. He has had seizures since  OUTPATIENT SPEECH LANGUAGE PATHOLOGY TREATMENT   Patient Name: Stephen Beard MRN: [id number] DOB:[birth year], 27 y.o., male Today's Date: 12/14/2022  PCP: Virgilio Belling REFERRING PROVIDER: Jacquelynn Cree, PA-C  END OF SESSION:  End of Session - 12/14/22 1018     Visit Number 4    Number of Visits 17    Date for SLP Re-Evaluation 01/11/23    SLP Start Time 1019    SLP Stop Time  1100    SLP Time Calculation (min) 41 min    Activity Tolerance Patient tolerated treatment well                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HCC)    GAD (generalized anxiety disorder)    Headache    Intentional overdose (HCC) 12/2019   Psychotic disorder (HCC)    Schizoaffective disorder (HCC)    Seizures (HCC)    Substance abuse (HCC)    Suicidal ideation    Past Surgical History:  Procedure Laterality Date   APPLICATION OF CRANIAL NAVIGATION N/A 07/02/2019   Procedure: APPLICATION OF CRANIAL NAVIGATION;  Surgeon: Jadene Pierini, MD;  Location: [facility];  Service: Neurosurgery;  Laterality: N/A;   CRANIOTOMY N/A 07/03/2019   Procedure: CRANIOTOMY HEMATOMA EVACUATION SUBDURAL;  Surgeon: Jadene Pierini, MD;  Location: [facility];  Service: Neurosurgery;  Laterality: N/A;   CRANIOTOMY Right 07/03/2019   Procedure: CRANIOTOMY HEMATOMA EVACUATION SUBDURAL;  Surgeon: Jadene Pierini, MD;  Location: [facility];  Service: Neurosurgery;  Laterality: Right;   CRANIOTOMY Right 07/02/2019   Procedure: CRANIOTOMY TUMOR EXCISION with Normajean Glasgow;  Surgeon: Jadene Pierini, MD;  Location: [facility];  Service: Neurosurgery;  Laterality: Right;  posterior   Patient Active Problem List   Diagnosis Date Noted   Nontraumatic subdural hemorrhage, unspecified (HCC) 11/07/2022   Acute respiratory failure with hypoxia (HCC) 11/01/2022   AKI (acute kidney injury) (HCC) 11/01/2022   Headache 11/30/2021   Status epilepticus  (HCC) 05/22/2021   Alcohol abuse 01/11/2020   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HCC) 01/07/2020   Intentional overdose of drug in tablet form (HCC) 01/05/2020   Drug overdose, multiple drugs, intentional self-harm, initial encounter (HCC) 01/04/2020   Schizoaffective disorder, depressive type (HCC) 01/01/2020   Generalized anxiety disorder 01/01/2020   Severe episode of recurrent major depressive disorder, with psychotic features (HCC) 01/01/2020   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)    Focal seizures (HCC) 11/07/2019   Auditory hallucinations 07/30/2019   Goals of care, counseling/discussion 07/19/2019   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HCC) 07/01/2019   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/01/2019   Brain mass 06/30/2019   Attention deficit hyperactivity disorder (ADHD), combined type 03/23/2019   Mild episode of recurrent major depressive disorder (HCC) 03/23/2019   Tobacco abuse 10/04/2012   Allergic rhinitis 08/18/2012    ONSET DATE: 11/10/2022- referral date; 11/01/22 hospitalization   REFERRING DIAG: G40.901 (ICD-10-CM) - Status epilepticus (HCC) C71.4 (ICD-10-CM) - 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HCC) I62.00 (ICD-10-CM) - Nontraumatic subdural hemorrhage, unspecified (HCC)  THERAPY DIAG: Cognitive communication deficit  Rationale for Evaluation and Treatment: Rehabilitation  SUBJECTIVE:   SUBJECTIVE STATEMENT: "I dream of getting a job or going back to school." Pt accompanied by: self  PERTINENT HISTORY: 27 year old male with past medical history as below, which is significant for anaplastic oligodendroglioma s/p resection and chemotherapy. He has had seizures since

## 2022-12-14 NOTE — Therapy (Signed)
OUTPATIENT OCCUPATIONAL THERAPY NEURO TREATMENT  Patient Name: Stephen Beard MRN: [id number] DOB:April 14, 1995, 27 y.o., male Today's Date: 12/14/2022  PCP: Morrell Riddle, PA-C REFERRING PROVIDER: Jacquelynn Cree, PA-C  END OF SESSION:  OT End of Session - 12/14/22 1211     Visit Number 4    OT Start Time 1100    OT Stop Time 1145    OT Time Calculation (min) 45 min    Equipment Utilized During Treatment Theraband    Activity Tolerance Patient tolerated treatment well    Behavior During Therapy WFL for tasks assessed/performed              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HCC)    GAD (generalized anxiety disorder)    Headache    Intentional overdose (HCC) 12/2019   Psychotic disorder (HCC)    Schizoaffective disorder (HCC)    Seizures (HCC)    Substance abuse (HCC)    Suicidal ideation    Past Surgical History:  Procedure Laterality Date   APPLICATION OF CRANIAL NAVIGATION N/A 07/02/2019   Procedure: APPLICATION OF CRANIAL NAVIGATION;  Surgeon: Jadene Pierini, MD;  Location: [facility] OR;  Service: Neurosurgery;  Laterality: N/A;   CRANIOTOMY N/A 07/03/2019   Procedure: CRANIOTOMY HEMATOMA EVACUATION SUBDURAL;  Surgeon: Jadene Pierini, MD;  Location: [facility] OR;  Service: Neurosurgery;  Laterality: N/A;   CRANIOTOMY Right 07/03/2019   Procedure: CRANIOTOMY HEMATOMA EVACUATION SUBDURAL;  Surgeon: Jadene Pierini, MD;  Location: [facility] OR;  Service: Neurosurgery;  Laterality: Right;   CRANIOTOMY Right 07/02/2019   Procedure: CRANIOTOMY TUMOR EXCISION with Normajean Glasgow;  Surgeon: Jadene Pierini, MD;  Location: [facility] OR;  Service: Neurosurgery;  Laterality: Right;  posterior   Patient Active Problem List   Diagnosis Date Noted   Nontraumatic subdural hemorrhage, unspecified (HCC) 11/07/2022   Acute respiratory failure with hypoxia (HCC) 11/01/2022   AKI (acute kidney injury) (HCC) 11/01/2022    Headache 11/30/2021   Status epilepticus (HCC) 05/22/2021   Alcohol abuse 01/11/2020   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HCC) 01/07/2020   Intentional overdose of drug in tablet form (HCC) 01/05/2020   Drug overdose, multiple drugs, intentional self-harm, initial encounter (HCC) 01/04/2020   Schizoaffective disorder, depressive type (HCC) 01/01/2020   Generalized anxiety disorder 01/01/2020   Severe episode of recurrent major depressive disorder, with psychotic features (HCC) 01/01/2020   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)    Focal seizures (HCC) 11/07/2019   Auditory hallucinations 07/30/2019   Goals of care, counseling/discussion 07/19/2019   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HCC) 07/01/2019   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/01/2019   Brain mass 06/30/2019   Attention deficit hyperactivity disorder (ADHD), combined type 03/23/2019   Mild episode of recurrent major depressive disorder (HCC) 03/23/2019   Tobacco abuse 10/04/2012   Allergic rhinitis 08/18/2012    ONSET DATE: 11/15/22  REFERRING DIAG: I62.00 (ICD-10-CM) - Nontraumatic subdural hemorrhage, unspecified  THERAPY DIAG:  Muscle weakness (generalized)  Other lack of coordination  Visuospatial deficit  Rationale for Evaluation and Treatment: Rehabilitation  SUBJECTIVE:   SUBJECTIVE STATEMENT: Pt reports continuing to use golf balls and putty for HEP. No questions and concerns. Pt reports some difficulty with vision which affects ability to complete puzzle tasks, though verbalized effective strategies, including finding and placing edge puzzle pieces first. Pt reported sometimes feeling frustrated during puzzle tasks.  Pt accompanied  by: self  PERTINENT HISTORY:   Pt presented to the hospital on 10/31/22 with seizure >30 mins. EEG consistent with epilepsy and cortical dysfunction arising from R hemisphere, also moderate diffuse encephalopathy (same as EEG on  admission a year ago). MRI showed trace SDH along the R parietal convexity.  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 Yes: NPRS scale: 2/10 Pain location: L thigh Pain description: aching Aggravating factors: sleeping on L side Relieving factors: Pt report "working out, Scientist, product/process development powder"     FALLS: Has patient fallen in last 6 months? Yes. Number of falls maybe 1 in the past 6 months   He has fallen several times before that but relates it to his vision and not seeing something right. Has to go see an ophthalmologist    LIVING ENVIRONMENT: Lives with: lives with their family (Parents & uncle with DS) Lives in: Apartment Stairs: No Has following equipment at home: None  PLOF: Independent - worked at [employer] (prior to resection/seizures in 2021).  Pt has not driven since then due to seizures and visual deficits.  Prior to most recent seizure, he was not working but has been volunteering at [employer].   PATIENT GOALS: Patient reports he wants to work on his cognition ie) understanding directions; his vision; and his balance.  OBJECTIVE:   HAND DOMINANCE: Right  ADLs: Overall ADLs: Independent Transfers/ambulation related to ADLs: Independent Tub Shower transfers: MI - holds the wall for support Equipment: none  IADLs:  Driving: Not driving x 3 years due to seizures and vision; reports that he has been unable to use [employer] [place] due to transportation not coming to their home Shopping: Accompanies parent (mom & step dad) to go shopping Light housekeeping: Helps do his own laundry,  Meal Prep: Cooks a little - does not use the air fryer Community mobility:  Ind Medication management: Takes his own medication but does report that he has missed some medication in the past Financial management: Not reported Handwriting:  Not tested  MOBILITY STATUS: Independent and Hx of falls  POSTURE COMMENTS:  No Significant postural limitations Sitting balance: WFL  ACTIVITY TOLERANCE: Activity tolerance: Good  UPPER EXTREMITY ROM:    Active ROM Right eval Left eval  Shoulder flexion WFL   Shoulder abduction   Shoulder adduction   Shoulder extension   Shoulder internal rotation   Shoulder external rotation   Elbow flexion   Elbow extension   Wrist flexion   Wrist extension   Wrist ulnar deviation   Wrist radial deviation   Wrist pronation   Wrist supination   (Blank rows = not tested)  UPPER EXTREMITY MMT:     MMT Right eval Left eval  Shoulder flexion 4 4  Shoulder abduction    Shoulder adduction    Shoulder extension    Shoulder internal rotation    Shoulder external rotation    Middle trapezius    Lower trapezius    Elbow flexion 4 4  Elbow extension    Wrist flexion    Wrist extension    Wrist ulnar deviation    Wrist radial deviation    Wrist pronation    Wrist supination    (Blank rows = not tested)  HAND FUNCTION:  Grip strength: Right: 59.3, 55.5, 62.3  lbs; Left: 60.8, 56.2, 69.4  lbs Average: Right: 59.0 lbs Left 62.1 lbs  COORDINATION: 9 Hole Peg test: Right: 34.5 sec; Left: 33.7 sec  SENSATION: WFL  EDEMA: NA  MUSCLE TONE: WFL  COGNITION: Overall cognitive status: Within functional limits for tasks assessed  VISION: Subjective report: Patient can see distance better than up close. He does report need to see an ophthalmologist and reports that his L eye is worse than R Baseline vision: Wears glasses all the time Visual history:  Impaired since brain tumor.  Does report it was pressing on his L eye  VISION ASSESSMENT: Impaired Tracking/Visual pursuits: Decreased smoothness with horizontal  tracking and especially towards the R side  Convergence ~ 12"  Inferior/superior R side intact, L side - full visual field cut for approaching objects from L side  Patient has difficulty with following activities due to following visual impairments: finding objects from L side   PERCEPTION: Not tested  PRAXIS: Not tested  OBSERVATIONS: Pt ambulates with no AE with no loss of balance. The pt appears well kept and brought his new calendar where he had his upcoming therapy appts listed.     TODAY'S TREATMENT:                                                                                                                               TherEx: Pt verbalized many cognitive strategies from previous therapy sessions, indicating carry over of educations, such as using sticky notes to improve  memory recall and completing puzzles with organization strategies. OT edu pt about taking breaks PRN during puzzle tasks to avoid frustration and to improve executive functioning and sequencing.  Initiated Theraband exercises to improve BUE strengthening, including to improve grasp strength for functional ADL/IADL tasks. Red Theraband. Updated HEP, handout provided, see pt instructions.  Exercises with Red Theraband - Shoulder ext, flex, scaption, horizontal abd with resistance - benefited from v/c "Keep arms straight." - Shoulder External Rotation/Scap Retraction and Single Arm Elbow Flex - benefited from tactile cues and v/c "Keep elbows at side, No chicken wings."  Pt confirmed no questions or concerns regarding Theraband exercises.   PATIENT EDUCATION: Education details: BUE Theraband exercises, reducing fatigue/activity tolerance, organization strategies Person educated: Patient Education method: Explanation, Demonstration, Tactile cues, Verbal cues, and Handouts  Education comprehension: verbalized understanding, returned demonstration, verbal cues required, tactile cues required, and needs  further education  HOME EXERCISE PROGRAM: 12/05/22: Coordination with images; Putty Exercises Access Code: [email or web address] 12/14/22: BUE Theraband Exercises (see pt instructions). Access Code: [email or web address]   GOALS: Goals reviewed with patient? Yes  SHORT TERM GOALS: Target date: 12/16/22  Patient will demonstrate BUE strength (theraband) and coordination HEP with 25% verbal cues or less for proper execution.  Baseline: New to OT Goal status: IN Progress  2.  Patient will demonstrate vision specific HEP with 25% verbal cues or less for proper execution.  Baseline: New  to OT - L visual deficits Goal status: IN Progress  3.  Patient will demonstrate at least 5-10 lbs improvement in grip strength as needed to open jars and other containers, carry multiple grocery bags etc. Baseline: Right: 59.0 lbs Left 62.1 lbs Goal status: IN Progress   LONG TERM GOALS: Target date: 01/06/23  Patient will independently recall at least 2 compensatory strategies for visual impairment without cueing. Baseline: L visual field cut with h/o falls r/t vision Goal status: IN Progress  2. Patient will return demonstration of visual adaptation use and verbalize understanding of how to obtain item(s) if desired.  Baseline: New to outpt OT Goal status: IN Progress  3.  Patient will complete nine-hole peg with use of B in less than 30 seconds.  Baseline: Right: 34.5 sec; Left: 33.7 sec Goal status: IN Progress  4.  Pt will verbalize understanding of ways to keep thinking skills sharp and ways to compensate for STM changes. Baseline: not yet initiated Goal status: IN Progress   ASSESSMENT:  CLINICAL IMPRESSION: Patient is a 27 y.o. male who was seen today for occupational therapy treatment following recent seizure activity. Pt benefited from extra processing time following instructions for new Theraband exercises and benefited from v/c and tactile cues to correctly complete BUE exercises. Following cues, pt  completed tasks correctly and tolerated tasks well today. Pt will benefit from further skilled OT services in the outpatient setting to improve strength, coordination and carryover of HEP.  PERFORMANCE DEFICITS: in functional skills including coordination, dexterity, proprioception, strength, Fine motor control, Gross motor control, decreased knowledge of use of DME, vision, and UE functional use, cognitive skills including emotional, memory, problem solving, and compensatory strategies (h/o drug use) , and psychosocial skills including coping strategies.   IMPAIRMENTS: are limiting patient from IADLs, work, leisure, and social participation.   CO-MORBIDITIES: has co-morbidities such as seizures and visual deficits  that affects occupational performance. Patient will benefit from skilled OT to address above impairments and improve overall function.  REHAB POTENTIAL: Excellent   PLAN:  OT FREQUENCY: 1-2x/week  OT DURATION: 6 weeks  PLANNED INTERVENTIONS: self care/ADL training, therapeutic exercise, therapeutic activity, neuromuscular re-education, patient/family education, cognitive remediation/compensation, visual/perceptual remediation/compensation, psychosocial skills training, coping strategies training, and DME and/or AE instructions  RECOMMENDED OTHER SERVICES: Patient received PT/ST   CONSULTED AND AGREED WITH PLAN OF CARE: Patient  PLAN FOR NEXT SESSIONS: Assess depth perception; Vision HEP Review Theraband exercises for strength and stability (standing) PRN, increase resistance or reps/sets if appropriate Check how puzzle activity went at home (weight bearing and visual scanning) Review High level coordination activities for speed and dexterity and visual challenges Blaze Pods (visual distractor setting) ~ progress reaction time  Online Memory ideas  Wynetta Emery, OT 12/14/2022, 12:36 PM

## 2022-12-14 NOTE — Patient Instructions (Signed)
Standing Shoulder and Elbow Theraband   Access Code: [email or web address] URL: [email or web address] Date: 12/14/2022 Prepared by: Carilyn Goodpasture  Exercises - Shoulder extension with resistance - Neutral  - 2 x daily - 1 sets - 10 reps - Standing Shoulder Flexion with Resistance  - 2 x daily - 1 sets - 10 reps - Standing Shoulder Scaption with Resistance  - 2 x daily - 1 sets - 10 reps - Standing Shoulder Horizontal Abduction with Resistance  - 2 x daily - 1 sets - 10 reps - Shoulder External Rotation and Scapular Retraction with Resistance  - 2 x daily - 1 sets - 10 reps - Standing Single Arm Elbow Flexion with Resistance  - 2 x daily - 1 sets - 10 reps

## 2022-12-19 ENCOUNTER — Ambulatory Visit: Payer: Medicare Other | Admitting: Physical Therapy

## 2022-12-19 ENCOUNTER — Encounter: Payer: Medicare Other | Admitting: Occupational Therapy

## 2022-12-21 ENCOUNTER — Ambulatory Visit: Payer: Medicare Other | Admitting: Speech Pathology

## 2022-12-21 ENCOUNTER — Encounter: Payer: Self-pay | Admitting: Occupational Therapy

## 2022-12-21 ENCOUNTER — Ambulatory Visit: Payer: Medicare Other | Admitting: Occupational Therapy

## 2022-12-21 ENCOUNTER — Encounter: Payer: Self-pay | Admitting: Speech Pathology

## 2022-12-21 DIAGNOSIS — R41842 Visuospatial deficit: Secondary | ICD-10-CM

## 2022-12-21 DIAGNOSIS — R41841 Cognitive communication deficit: Secondary | ICD-10-CM

## 2022-12-21 DIAGNOSIS — M6281 Muscle weakness (generalized): Secondary | ICD-10-CM | POA: Diagnosis not present

## 2022-12-21 DIAGNOSIS — R278 Other lack of coordination: Secondary | ICD-10-CM

## 2022-12-21 DIAGNOSIS — R41844 Frontal lobe and executive function deficit: Secondary | ICD-10-CM

## 2022-12-21 NOTE — Therapy (Signed)
OUTPATIENT OCCUPATIONAL THERAPY NEURO TREATMENT  Patient Name: Stephen Beard MRN: [id number] DOB:October 05, 1995, 27 y.o., male Today's Date: 12/21/2022  PCP: Morrell Riddle, PA-C REFERRING PROVIDER: Jacquelynn Cree, PA-C  END OF SESSION:  OT End of Session - 12/21/22 1015     Visit Number 5    Number of Visits 10    Date for OT Re-Evaluation 12/30/22    Authorization Type MEDICARE PART A AND B    OT Start Time 1015    OT Stop Time 1100    OT Time Calculation (min) 45 min    Equipment Utilized During Treatment Blaze pods, puzzle    Activity Tolerance Patient tolerated treatment well    Behavior During Therapy WFL for tasks assessed/performed              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HCC)    GAD (generalized anxiety disorder)    Headache    Intentional overdose (HCC) 12/2019   Psychotic disorder (HCC)    Schizoaffective disorder (HCC)    Seizures (HCC)    Substance abuse (HCC)    Suicidal ideation    Past Surgical History:  Procedure Laterality Date   APPLICATION OF CRANIAL NAVIGATION N/A 07/02/2019   Procedure: APPLICATION OF CRANIAL NAVIGATION;  Surgeon: Jadene Pierini, MD;  Location: [facility];  Service: Neurosurgery;  Laterality: N/A;   CRANIOTOMY N/A 07/03/2019   Procedure: CRANIOTOMY HEMATOMA EVACUATION SUBDURAL;  Surgeon: Jadene Pierini, MD;  Location: [facility];  Service: Neurosurgery;  Laterality: N/A;   CRANIOTOMY Right 07/03/2019   Procedure: CRANIOTOMY HEMATOMA EVACUATION SUBDURAL;  Surgeon: Jadene Pierini, MD;  Location: [facility];  Service: Neurosurgery;  Laterality: Right;   CRANIOTOMY Right 07/02/2019   Procedure: CRANIOTOMY TUMOR EXCISION with Normajean Glasgow;  Surgeon: Jadene Pierini, MD;  Location: [facility];  Service: Neurosurgery;  Laterality: Right;  posterior   Patient Active Problem List   Diagnosis Date Noted   Nontraumatic subdural hemorrhage, unspecified (HCC)  11/07/2022   Acute respiratory failure with hypoxia (HCC) 11/01/2022   AKI (acute kidney injury) (HCC) 11/01/2022   Headache 11/30/2021   Status epilepticus (HCC) 05/22/2021   Alcohol abuse 01/11/2020   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HCC) 01/07/2020   Intentional overdose of drug in tablet form (HCC) 01/05/2020   Drug overdose, multiple drugs, intentional self-harm, initial encounter (HCC) 01/04/2020   Schizoaffective disorder, depressive type (HCC) 01/01/2020   Generalized anxiety disorder 01/01/2020   Severe episode of recurrent major depressive disorder, with psychotic features (HCC) 01/01/2020   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)    Focal seizures (HCC) 11/07/2019   Auditory hallucinations 07/30/2019   Goals of care, counseling/discussion 07/19/2019   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HCC) 07/01/2019   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/01/2019   Brain mass 06/30/2019   Attention deficit hyperactivity disorder (ADHD), combined type 03/23/2019   Mild episode of recurrent major depressive disorder (HCC) 03/23/2019   Tobacco abuse 10/04/2012   Allergic rhinitis 08/18/2012    ONSET DATE: 11/15/22  REFERRING DIAG: I62.00 (ICD-10-CM) - Nontraumatic subdural hemorrhage, unspecified  THERAPY DIAG:  Visuospatial deficit  Other lack of coordination  Muscle weakness (generalized)  Frontal lobe and executive function deficit  Rationale for Evaluation and Treatment: Rehabilitation  SUBJECTIVE:   SUBJECTIVE STATEMENT: I have trouble with the puzzle b/c of the vision. I still have the puzzle  Pt  OUTPATIENT OCCUPATIONAL THERAPY NEURO TREATMENT  Patient Name: Stephen Beard MRN: [id number] DOB:October 05, 1995, 27 y.o., male Today's Date: 12/21/2022  PCP: Morrell Riddle, PA-C REFERRING PROVIDER: Jacquelynn Cree, PA-C  END OF SESSION:  OT End of Session - 12/21/22 1015     Visit Number 5    Number of Visits 10    Date for OT Re-Evaluation 12/30/22    Authorization Type MEDICARE PART A AND B    OT Start Time 1015    OT Stop Time 1100    OT Time Calculation (min) 45 min    Equipment Utilized During Treatment Blaze pods, puzzle    Activity Tolerance Patient tolerated treatment well    Behavior During Therapy WFL for tasks assessed/performed              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HCC)    GAD (generalized anxiety disorder)    Headache    Intentional overdose (HCC) 12/2019   Psychotic disorder (HCC)    Schizoaffective disorder (HCC)    Seizures (HCC)    Substance abuse (HCC)    Suicidal ideation    Past Surgical History:  Procedure Laterality Date   APPLICATION OF CRANIAL NAVIGATION N/A 07/02/2019   Procedure: APPLICATION OF CRANIAL NAVIGATION;  Surgeon: Jadene Pierini, MD;  [facility];  Service: Neurosurgery;  Laterality: N/A;   CRANIOTOMY N/A 07/03/2019   Procedure: CRANIOTOMY HEMATOMA EVACUATION SUBDURAL;  Surgeon: Jadene Pierini, MD;  Location: [facility];  Service: Neurosurgery;  Laterality: N/A;   CRANIOTOMY Right 07/03/2019   Procedure: CRANIOTOMY HEMATOMA EVACUATION SUBDURAL;  Surgeon: Jadene Pierini, MD;  Location: [facility];  Service: Neurosurgery;  Laterality: Right;   CRANIOTOMY Right 07/02/2019   Procedure: CRANIOTOMY TUMOR EXCISION with Normajean Glasgow;  Surgeon: Jadene Pierini, MD;  Location: [facility];  Service: Neurosurgery;  Laterality: Right;  posterior   Patient Active Problem List   Diagnosis Date Noted   Nontraumatic subdural hemorrhage, unspecified (HCC)  11/07/2022   Acute respiratory failure with hypoxia (HCC) 11/01/2022   AKI (acute kidney injury) (HCC) 11/01/2022   Headache 11/30/2021   Status epilepticus (HCC) 05/22/2021   Alcohol abuse 01/11/2020   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HCC) 01/07/2020   Intentional overdose of drug in tablet form (HCC) 01/05/2020   Drug overdose, multiple drugs, intentional self-harm, initial encounter (HCC) 01/04/2020   Schizoaffective disorder, depressive type (HCC) 01/01/2020   Generalized anxiety disorder 01/01/2020   Severe episode of recurrent major depressive disorder, with psychotic features (HCC) 01/01/2020   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)    Focal seizures (HCC) 11/07/2019   Auditory hallucinations 07/30/2019   Goals of care, counseling/discussion 07/19/2019   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HCC) 07/01/2019   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/01/2019   Brain mass 06/30/2019   Attention deficit hyperactivity disorder (ADHD), combined type 03/23/2019   Mild episode of recurrent major depressive disorder (HCC) 03/23/2019   Tobacco abuse 10/04/2012   Allergic rhinitis 08/18/2012    ONSET DATE: 11/15/22  REFERRING DIAG: I62.00 (ICD-10-CM) - Nontraumatic subdural hemorrhage, unspecified  THERAPY DIAG:  Visuospatial deficit  Other lack of coordination  Muscle weakness (generalized)  Frontal lobe and executive function deficit  Rationale for Evaluation and Treatment: Rehabilitation  SUBJECTIVE:   SUBJECTIVE STATEMENT: I have trouble with the puzzle b/c of the vision. I still have the puzzle  Pt  OUTPATIENT OCCUPATIONAL THERAPY NEURO TREATMENT  Patient Name: Stephen Beard MRN: [id number] DOB:October 05, 1995, 27 y.o., male Today's Date: 12/21/2022  PCP: Morrell Riddle, PA-C REFERRING PROVIDER: Jacquelynn Cree, PA-C  END OF SESSION:  OT End of Session - 12/21/22 1015     Visit Number 5    Number of Visits 10    Date for OT Re-Evaluation 12/30/22    Authorization Type MEDICARE PART A AND B    OT Start Time 1015    OT Stop Time 1100    OT Time Calculation (min) 45 min    Equipment Utilized During Treatment Blaze pods, puzzle    Activity Tolerance Patient tolerated treatment well    Behavior During Therapy WFL for tasks assessed/performed              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HCC)    GAD (generalized anxiety disorder)    Headache    Intentional overdose (HCC) 12/2019   Psychotic disorder (HCC)    Schizoaffective disorder (HCC)    Seizures (HCC)    Substance abuse (HCC)    Suicidal ideation    Past Surgical History:  Procedure Laterality Date   APPLICATION OF CRANIAL NAVIGATION N/A 07/02/2019   Procedure: APPLICATION OF CRANIAL NAVIGATION;  Surgeon: Jadene Pierini, MD;  Location: [facility];  Service: Neurosurgery;  Laterality: N/A;   CRANIOTOMY N/A 07/03/2019   Procedure: CRANIOTOMY HEMATOMA EVACUATION SUBDURAL;  Surgeon: Jadene Pierini, MD;  Location: [facility];  Service: Neurosurgery;  Laterality: N/A;   CRANIOTOMY Right 07/03/2019   Procedure: CRANIOTOMY HEMATOMA EVACUATION SUBDURAL;  Surgeon: Jadene Pierini, MD;  Location: [facility];  Service: Neurosurgery;  Laterality: Right;   CRANIOTOMY Right 07/02/2019   Procedure: CRANIOTOMY TUMOR EXCISION with Normajean Glasgow;  Surgeon: Jadene Pierini, MD;  Location: [facility];  Service: Neurosurgery;  Laterality: Right;  posterior   Patient Active Problem List   Diagnosis Date Noted   Nontraumatic subdural hemorrhage, unspecified (HCC)  11/07/2022   Acute respiratory failure with hypoxia (HCC) 11/01/2022   AKI (acute kidney injury) (HCC) 11/01/2022   Headache 11/30/2021   Status epilepticus (HCC) 05/22/2021   Alcohol abuse 01/11/2020   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HCC) 01/07/2020   Intentional overdose of drug in tablet form (HCC) 01/05/2020   Drug overdose, multiple drugs, intentional self-harm, initial encounter (HCC) 01/04/2020   Schizoaffective disorder, depressive type (HCC) 01/01/2020   Generalized anxiety disorder 01/01/2020   Severe episode of recurrent major depressive disorder, with psychotic features (HCC) 01/01/2020   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)    Focal seizures (HCC) 11/07/2019   Auditory hallucinations 07/30/2019   Goals of care, counseling/discussion 07/19/2019   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HCC) 07/01/2019   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/01/2019   Brain mass 06/30/2019   Attention deficit hyperactivity disorder (ADHD), combined type 03/23/2019   Mild episode of recurrent major depressive disorder (HCC) 03/23/2019   Tobacco abuse 10/04/2012   Allergic rhinitis 08/18/2012    ONSET DATE: 11/15/22  REFERRING DIAG: I62.00 (ICD-10-CM) - Nontraumatic subdural hemorrhage, unspecified  THERAPY DIAG:  Visuospatial deficit  Other lack of coordination  Muscle weakness (generalized)  Frontal lobe and executive function deficit  Rationale for Evaluation and Treatment: Rehabilitation  SUBJECTIVE:   SUBJECTIVE STATEMENT: I have trouble with the puzzle b/c of the vision. I still have the puzzle  Pt  OUTPATIENT OCCUPATIONAL THERAPY NEURO TREATMENT  Patient Name: Stephen Beard MRN: [id number] DOB:October 05, 1995, 27 y.o., male Today's Date: 12/21/2022  PCP: Morrell Riddle, PA-C REFERRING PROVIDER: Jacquelynn Cree, PA-C  END OF SESSION:  OT End of Session - 12/21/22 1015     Visit Number 5    Number of Visits 10    Date for OT Re-Evaluation 12/30/22    Authorization Type MEDICARE PART A AND B    OT Start Time 1015    OT Stop Time 1100    OT Time Calculation (min) 45 min    Equipment Utilized During Treatment Blaze pods, puzzle    Activity Tolerance Patient tolerated treatment well    Behavior During Therapy WFL for tasks assessed/performed              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HCC)    GAD (generalized anxiety disorder)    Headache    Intentional overdose (HCC) 12/2019   Psychotic disorder (HCC)    Schizoaffective disorder (HCC)    Seizures (HCC)    Substance abuse (HCC)    Suicidal ideation    Past Surgical History:  Procedure Laterality Date   APPLICATION OF CRANIAL NAVIGATION N/A 07/02/2019   Procedure: APPLICATION OF CRANIAL NAVIGATION;  Surgeon: Jadene Pierini, MD;  Location: [facility];  Service: Neurosurgery;  Laterality: N/A;   CRANIOTOMY N/A 07/03/2019   Procedure: CRANIOTOMY HEMATOMA EVACUATION SUBDURAL;  Surgeon: Jadene Pierini, MD;  Location: [facility];  Service: Neurosurgery;  Laterality: N/A;   CRANIOTOMY Right 07/03/2019   Procedure: CRANIOTOMY HEMATOMA EVACUATION SUBDURAL;  Surgeon: Jadene Pierini, MD;  Location: [facility];  Service: Neurosurgery;  Laterality: Right;   CRANIOTOMY Right 07/02/2019   Procedure: CRANIOTOMY TUMOR EXCISION with Normajean Glasgow;  Surgeon: Jadene Pierini, MD;  Location: [facility];  Service: Neurosurgery;  Laterality: Right;  posterior   Patient Active Problem List   Diagnosis Date Noted   Nontraumatic subdural hemorrhage, unspecified (HCC)  11/07/2022   Acute respiratory failure with hypoxia (HCC) 11/01/2022   AKI (acute kidney injury) (HCC) 11/01/2022   Headache 11/30/2021   Status epilepticus (HCC) 05/22/2021   Alcohol abuse 01/11/2020   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HCC) 01/07/2020   Intentional overdose of drug in tablet form (HCC) 01/05/2020   Drug overdose, multiple drugs, intentional self-harm, initial encounter (HCC) 01/04/2020   Schizoaffective disorder, depressive type (HCC) 01/01/2020   Generalized anxiety disorder 01/01/2020   Severe episode of recurrent major depressive disorder, with psychotic features (HCC) 01/01/2020   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)    Focal seizures (HCC) 11/07/2019   Auditory hallucinations 07/30/2019   Goals of care, counseling/discussion 07/19/2019   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HCC) 07/01/2019   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/01/2019   Brain mass 06/30/2019   Attention deficit hyperactivity disorder (ADHD), combined type 03/23/2019   Mild episode of recurrent major depressive disorder (HCC) 03/23/2019   Tobacco abuse 10/04/2012   Allergic rhinitis 08/18/2012    ONSET DATE: 11/15/22  REFERRING DIAG: I62.00 (ICD-10-CM) - Nontraumatic subdural hemorrhage, unspecified  THERAPY DIAG:  Visuospatial deficit  Other lack of coordination  Muscle weakness (generalized)  Frontal lobe and executive function deficit  Rationale for Evaluation and Treatment: Rehabilitation  SUBJECTIVE:   SUBJECTIVE STATEMENT: I have trouble with the puzzle b/c of the vision. I still have the puzzle  Pt

## 2022-12-21 NOTE — Patient Instructions (Addendum)
VISUAL SCANNING STRATEGIES:   1. Look for the edge of objects (to the left and/or right) so that you make sure you are seeing all of an object 2. Turn your head when walking, scan from side to side, particularly in busy environments 3. Use an organized scanning pattern. It's usually easier to scan from top to bottom, and left to right (like you are reading) 4. Double check yourself 5. Use a line guide (like a blank piece of paper) or your finger when reading 6. If necessary, place brightly colored tape at end of table or work area as a reminder to always look until you see the tape.    Activities to try at home to encourage visual scanning:   1. Word searches 2. Mazes 3. Puzzles 4. Card games 5. Computer games and/or searches 6. Connect-the-dots  Activities for environmental (larger) scanning:  1. With supervision, scan for items in grocery store or drugstore.  Begin with a familiar store, then progress to a new store you've never been in before. Make sure you have supervision with this.

## 2022-12-21 NOTE — Therapy (Addendum)
OUTPATIENT SPEECH LANGUAGE PATHOLOGY TREATMENT & DISCHARGE SUMMARY   Patient Name: Stephen Beard MRN: [id number] DOB:May 29, 1995, 27 y.o., male Today's Date: 12/21/2022  PCP: Virgilio Belling REFERRING PROVIDER: Jacquelynn Cree, PA-C  END OF SESSION:  End of Session - 12/21/22 1106     Visit Number 5    Number of Visits 17    Date for SLP Re-Evaluation 01/11/23    SLP Start Time 1104    SLP Stop Time  1145    SLP Time Calculation (min) 41 min                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HCC)    GAD (generalized anxiety disorder)    Headache    Intentional overdose (HCC) 12/2019   Psychotic disorder (HCC)    Schizoaffective disorder (HCC)    Seizures (HCC)    Substance abuse (HCC)    Suicidal ideation    Past Surgical History:  Procedure Laterality Date   APPLICATION OF CRANIAL NAVIGATION N/A 07/02/2019   Procedure: APPLICATION OF CRANIAL NAVIGATION;  Surgeon: Jadene Pierini, MD;  Location: [facility] OR;  Service: Neurosurgery;  Laterality: N/A;   CRANIOTOMY N/A 07/03/2019   Procedure: CRANIOTOMY HEMATOMA EVACUATION SUBDURAL;  Surgeon: Jadene Pierini, MD;  Location: [facility] OR;  Service: Neurosurgery;  Laterality: N/A;   CRANIOTOMY Right 07/03/2019   Procedure: CRANIOTOMY HEMATOMA EVACUATION SUBDURAL;  Surgeon: Jadene Pierini, MD;  Location: [facility] OR;  Service: Neurosurgery;  Laterality: Right;   CRANIOTOMY Right 07/02/2019   Procedure: CRANIOTOMY TUMOR EXCISION with Normajean Glasgow;  Surgeon: Jadene Pierini, MD;  Location: [facility] OR;  Service: Neurosurgery;  Laterality: Right;  posterior   Patient Active Problem List   Diagnosis Date Noted   Nontraumatic subdural hemorrhage, unspecified (HCC) 11/07/2022   Acute respiratory failure with hypoxia (HCC) 11/01/2022   AKI (acute kidney injury) (HCC) 11/01/2022   Headache 11/30/2021   Status epilepticus (HCC) 05/22/2021   Alcohol abuse  01/11/2020   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HCC) 01/07/2020   Intentional overdose of drug in tablet form (HCC) 01/05/2020   Drug overdose, multiple drugs, intentional self-harm, initial encounter (HCC) 01/04/2020   Schizoaffective disorder, depressive type (HCC) 01/01/2020   Generalized anxiety disorder 01/01/2020   Severe episode of recurrent major depressive disorder, with psychotic features (HCC) 01/01/2020   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)    Focal seizures (HCC) 11/07/2019   Auditory hallucinations 07/30/2019   Goals of care, counseling/discussion 07/19/2019   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HCC) 07/01/2019   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/01/2019   Brain mass 06/30/2019   Attention deficit hyperactivity disorder (ADHD), combined type 03/23/2019   Mild episode of recurrent major depressive disorder (HCC) 03/23/2019   Tobacco abuse 10/04/2012   Allergic rhinitis 08/18/2012    ONSET DATE: 11/10/2022- referral date; 11/01/22 hospitalization   REFERRING DIAG: G40.901 (ICD-10-CM) - Status epilepticus (HCC) C71.4 (ICD-10-CM) - 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HCC) I62.00 (ICD-10-CM) - Nontraumatic subdural hemorrhage, unspecified (HCC)  THERAPY DIAG: Cognitive communication deficit  Rationale for Evaluation and Treatment: Rehabilitation  SUBJECTIVE:   SUBJECTIVE STATEMENT: "I dream of getting a job or going back to school." Pt accompanied by: self  PERTINENT HISTORY: 27 year old male with past medical history as below, which is significant for anaplastic oligodendroglioma s/p resection and chemotherapy. He has had seizures since that time which have not been  managed with lamotrigine. In the evening hours of 11/01/22 he suffered a seizure at home which went on for 15 minutes before the arrival of EMS. He was treated with benzodiazepines by EMS without improvement. He continued to seize upon arrival to the emergency  department he was treated with additional benzodiazepines and IV Keppra bolus. Duration of seizure activity estimated at 30 minutes. Seizures did then eventually break, but the patient was unresponsive and desaturating requiring intubation in the emergency department. Pt was intubated 8/19-8/23. EEG suggestive of epileptogenicity and cortical dysfunction in right parieto- occipital region likely secondary to underlying structural abnormality.  PAIN:  Are you having pain? No  FALLS: Has patient fallen in last 6 months?  See PT evaluation for details  LIVING ENVIRONMENT: Lives with: lives with their family Lives in: House/apartment  PLOF:  Level of assistance: Independent with ADLs, Needed assistance with IADLS Employment: On disability  PATIENT GOALS: "To get my life back, have my own place, go back to work"  OBJECTIVE:   TODAY'S TREATMENT:                                                                                                                                          12/21/22: Stephen Beard has reached out to [facility] and has some opportunity for seasonal work and can get rides from his grandma for day shifts. He is waiting to hear about a start date. Stephen Beard verbalizes 2 strategies to compensate for attention at work, being mindful to scan aisle and environment and taking breaks when he feels overwhelmed with occasional min questioning cues. With min questioning cues, Stephen Beard endorses that he is self advocating more frequently for others to repeat them selves or text him information to assist his processing and attention with verbal information. He is using his planner successfully to mange and recall his appointments and instructions. He reports he double checks his planner the night before . Score on Cognitive Function Short Form improved from 16 to 23.   12/14/22: Pt entered questioning need for continued ST due to limited cognitive challenges at home. With SLP prompting, he expressed  desire to return to work and identified two possible challenges (impaired attention and unreliable transportation). Initiated education of attention strategies (see pt instructions) to facilitate success in the work environment. Reduced anticipatory awareness noted, with usual SLP A required to correlate strategy to functional application for various scenarios. Pt determined that the following strategies would be useful for returning to work: completing one task at a time, limiting distractions, alerting listener that you may need reminders or information repeated, and writing important things down. SLP guided pt in problem-solving transportation issues with occasional prompting to ID possible solutions. Pt discussed wanting to finish school but questioned ability to return at current level. Educated pt on seeking accommodations for cognitive deficits. Handout provided.   12/05/22: Endorsed using budget and written aid for  bill pay, with benefit reported. SLP addressed planning and problem-solving via strategies to improve attention and make daily planner more efficient (using sticky note to mark the correct week, verbalizing appointment times/dates). Pt endorsed attempting to use strategies taught in previous session; however, experienced difficulty due to underlying ADHD. Noted intermittent word finding in conversation, which often contributed to poor topic maintenance. Further assessed word-finding via BNT with 90% accuracy given occasional mod A and frequent self-corrections. Educated patient on word finding strategies with handout provided. Pt self-reported deviation in attention during education, with appropriate request for SLP to repeat information. Affirmed use of self-advocacy when deviations in attention occur.   11/30/22: Stephen Beard continues to report significant attention difficulty (he was taken off ADHD meds due to contraindicated with seizure meds. Today we targeted compensatory compensations for  auditory processing and attention to conversations - see Pt Instructions. We collaboratively generated 5 strategies to improve processing. Targeted recalling and completing household chores and to do tasks generating 3 priorities for the week, breaking them into steps (washer, dryer, fold) to cross off after each step is done (he endorses forgetting clothes in the washer or note being able to find clothes, forgetting they are in the dryer), as well as "extra tasks" he would like to complete, such as putting his phone number is his Bible as he frequently leaves it at church or in a friend's car. He reports difficulty recalling names of people he has know for a long time. We generated strategy of using a sticky note with names of church members he interacts with inside of his Bible to review names on the way to church or to look up a name when he needs to.   11/16/22 Eval day: Educated pt re results of CLQT, reviewed goals and tx using compensatory strategies   PATIENT EDUCATION: Education details: See Today's Treatment and Patient Instructions; compensatory strategies for cognitive impairment Person educated: Patient Education method: Explanation, Demonstration, and Verbal cues Education comprehension: verbal cues required and needs further education    SPEECH THERAPY DISCHARGE SUMMARY  Visits from Start of Care: 5  Current functional level related to goals / functional outcomes: See goals below   Remaining deficits: ADHD    Education / Equipment: Compensations for attention, memory,processing   Patient agrees to discharge. Patient goals were met. Patient is being discharged due to meeting the stated rehab goals.Marland Kitchen    GOALS: Goals reviewed with patient? Yes  SHORT TERM GOALS: Target date: 12/14/22  Pt will carryover 2 compensatory strategies to support auditory processing in conversation and for multistep instructions with occasional min A Baseline: Goal status: PARTIALLY MET  2.   Pt will carryover 2 compensations for attention/memory in the kitchen with occasional min A Baseline:  Goal status: DEFERRED   3.  Pt will use external memory aids/notebook or calendar to recall and complete 3 house hold or community tasks a day Baseline:  Goal status: MET  4.  Pt will carryover 2 compensatory strategies to support processing of his thoughts during conversation to maintain train of thought with occasional min A Baseline:  Goal status: MET   LONG TERM GOALS: Target date: 01/11/23  Pt will carryover 3 compensations, including environmental modifications to support processing verbal information from family, health care team and friends with rare min A Baseline:  Goal status: MET  2.  Pt will use external memory aids to recall schedule, to do, appointments and errands with mod I over 1 week Baseline:  Goal status: MET  3.  Pt will carryover 3 compensatory strategies to maintain train of thought and communication during conversations with mod I Baseline:  Goal status: MET  4.  Pt will improve score on Cogntive Function Short Form by 2 points Baseline: 16, post 23 Goal status: MET   ASSESSMENT:  CLINICAL IMPRESSION: Patient is a 27 y.o. male who was seen today for mild cognitive communication impairments. Improvements managing household tasks endorsed. Stephen Beard has successfully carried over compensatory strategies for attention in conversations, and in anticipatory awareness for possible return to work in some limited capacity.  Continued with education and training of cognitive compensations to aid successful return to prior tasks.  Stephen Beard is pleased with his current level  - d/c ST at this time.  OBJECTIVE IMPAIRMENTS: include attention, memory, awareness, executive functioning, and receptive language. These impairments are limiting patient from return to work, household responsibilities, and ADLs/IADLs. Factors affecting potential to achieve goals and functional  outcome are medical prognosis.. Patient will benefit from skilled SLP services to address above impairments and improve overall function.  REHAB POTENTIAL: Good  PLAN:  SLP FREQUENCY: 2x/week  SLP DURATION: 8 weeks  PLANNED INTERVENTIONS: Language facilitation, Environmental controls, Cueing hierachy, Cognitive reorganization, Internal/external aids, Functional tasks, Multimodal communication approach, SLP instruction and feedback, Compensatory strategies, and Patient/family education    Dara Hoyer, CCC-SLP 12/21/2022, 12:20 PM

## 2022-12-26 ENCOUNTER — Encounter: Payer: Medicare Other | Admitting: Speech Pathology

## 2022-12-26 ENCOUNTER — Encounter: Payer: Self-pay | Admitting: Physical Therapy

## 2022-12-26 ENCOUNTER — Ambulatory Visit: Payer: Medicare Other | Admitting: Physical Therapy

## 2022-12-26 ENCOUNTER — Ambulatory Visit: Payer: Medicare Other | Admitting: Occupational Therapy

## 2022-12-26 DIAGNOSIS — R41842 Visuospatial deficit: Secondary | ICD-10-CM

## 2022-12-26 DIAGNOSIS — R41840 Attention and concentration deficit: Secondary | ICD-10-CM

## 2022-12-26 DIAGNOSIS — R41844 Frontal lobe and executive function deficit: Secondary | ICD-10-CM

## 2022-12-26 DIAGNOSIS — M6281 Muscle weakness (generalized): Secondary | ICD-10-CM

## 2022-12-26 DIAGNOSIS — R278 Other lack of coordination: Secondary | ICD-10-CM

## 2022-12-26 DIAGNOSIS — R2689 Other abnormalities of gait and mobility: Secondary | ICD-10-CM

## 2022-12-26 DIAGNOSIS — R2681 Unsteadiness on feet: Secondary | ICD-10-CM

## 2022-12-26 NOTE — Therapy (Signed)
OUTPATIENT OCCUPATIONAL THERAPY NEURO TREATMENT  Patient Name: Stephen Beard MRN: [id number] DOB:04-Apr-1995, 27 y.o., male Today's Date: 12/26/2022  PCP: Morrell Riddle, PA-C REFERRING PROVIDER: Jacquelynn Cree, PA-C  END OF SESSION:  OT End of Session - 12/26/22 1314     Visit Number 6    Number of Visits 10    Date for OT Re-Evaluation 12/30/22    Authorization Type MEDICARE PART A AND B    OT Start Time 1315    OT Stop Time 1400    OT Time Calculation (min) 45 min    Activity Tolerance Patient tolerated treatment well    Behavior During Therapy WFL for tasks assessed/performed              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HCC)    GAD (generalized anxiety disorder)    Headache    Intentional overdose (HCC) 12/2019   Psychotic disorder (HCC)    Schizoaffective disorder (HCC)    Seizures (HCC)    Substance abuse (HCC)    Suicidal ideation    Past Surgical History:  Procedure Laterality Date   APPLICATION OF CRANIAL NAVIGATION N/A 07/02/2019   Procedure: APPLICATION OF CRANIAL NAVIGATION;  Surgeon: Jadene Pierini, MD;  Location: [facility];  Service: Neurosurgery;  Laterality: N/A;   CRANIOTOMY N/A 07/03/2019   Procedure: CRANIOTOMY HEMATOMA EVACUATION SUBDURAL;  Surgeon: Jadene Pierini, MD;  Location: [facility];  Service: Neurosurgery;  Laterality: N/A;   CRANIOTOMY Right 07/03/2019   Procedure: CRANIOTOMY HEMATOMA EVACUATION SUBDURAL;  Surgeon: Jadene Pierini, MD;  Location: [facility];  Service: Neurosurgery;  Laterality: Right;   CRANIOTOMY Right 07/02/2019   Procedure: CRANIOTOMY TUMOR EXCISION with Normajean Glasgow;  Surgeon: Jadene Pierini, MD;  Location: [facility];  Service: Neurosurgery;  Laterality: Right;  posterior   Patient Active Problem List   Diagnosis Date Noted   Nontraumatic subdural hemorrhage, unspecified (HCC) 11/07/2022   Acute respiratory failure with hypoxia  (HCC) 11/01/2022   AKI (acute kidney injury) (HCC) 11/01/2022   Headache 11/30/2021   Status epilepticus (HCC) 05/22/2021   Alcohol abuse 01/11/2020   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HCC) 01/07/2020   Intentional overdose of drug in tablet form (HCC) 01/05/2020   Drug overdose, multiple drugs, intentional self-harm, initial encounter (HCC) 01/04/2020   Schizoaffective disorder, depressive type (HCC) 01/01/2020   Generalized anxiety disorder 01/01/2020   Severe episode of recurrent major depressive disorder, with psychotic features (HCC) 01/01/2020   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)    Focal seizures (HCC) 11/07/2019   Auditory hallucinations 07/30/2019   Goals of care, counseling/discussion 07/19/2019   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HCC) 07/01/2019   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/01/2019   Brain mass 06/30/2019   Attention deficit hyperactivity disorder (ADHD), combined type 03/23/2019   Mild episode of recurrent major depressive disorder (HCC) 03/23/2019   Tobacco abuse 10/04/2012   Allergic rhinitis 08/18/2012    ONSET DATE: 11/15/22  REFERRING DIAG: I62.00 (ICD-10-CM) - Nontraumatic subdural hemorrhage, unspecified  THERAPY DIAG:  Visuospatial deficit  Other lack of coordination  Frontal lobe and executive function deficit  Attention and concentration deficit  Rationale for Evaluation and Treatment: Rehabilitation  SUBJECTIVE:   SUBJECTIVE STATEMENT: Pt reports he left the puzzle at the front door but forgot to grab it on his way out.   Pt accompanied by: self  OUTPATIENT OCCUPATIONAL THERAPY NEURO TREATMENT  Patient Name: Stephen Beard MRN: [id number] DOB:04-Apr-1995, 27 y.o., male Today's Date: 12/26/2022  PCP: Morrell Riddle, PA-C REFERRING PROVIDER: Jacquelynn Cree, PA-C  END OF SESSION:  OT End of Session - 12/26/22 1314     Visit Number 6    Number of Visits 10    Date for OT Re-Evaluation 12/30/22    Authorization Type MEDICARE PART A AND B    OT Start Time 1315    OT Stop Time 1400    OT Time Calculation (min) 45 min    Activity Tolerance Patient tolerated treatment well    Behavior During Therapy WFL for tasks assessed/performed              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HCC)    GAD (generalized anxiety disorder)    Headache    Intentional overdose (HCC) 12/2019   Psychotic disorder (HCC)    Schizoaffective disorder (HCC)    Seizures (HCC)    Substance abuse (HCC)    Suicidal ideation    Past Surgical History:  Procedure Laterality Date   APPLICATION OF CRANIAL NAVIGATION N/A 07/02/2019   Procedure: APPLICATION OF CRANIAL NAVIGATION;  Surgeon: Jadene Pierini, MD;  [facility];  Service: Neurosurgery;  Laterality: N/A;   CRANIOTOMY N/A 07/03/2019   Procedure: CRANIOTOMY HEMATOMA EVACUATION SUBDURAL;  Surgeon: Jadene Pierini, MD;  [facility];  Service: Neurosurgery;  Laterality: N/A;   CRANIOTOMY Right 07/03/2019   Procedure: CRANIOTOMY HEMATOMA EVACUATION SUBDURAL;  Surgeon: Jadene Pierini, MD;  [facility];  Service: Neurosurgery;  Laterality: Right;   CRANIOTOMY Right 07/02/2019   Procedure: CRANIOTOMY TUMOR EXCISION with Normajean Glasgow;  Surgeon: Jadene Pierini, MD;  [facility]: [facility];  Service: Neurosurgery;  Laterality: Right;  posterior   Patient Active Problem List   Diagnosis Date Noted   Nontraumatic subdural hemorrhage, unspecified (HCC) 11/07/2022   Acute respiratory failure with hypoxia  (HCC) 11/01/2022   AKI (acute kidney injury) (HCC) 11/01/2022   Headache 11/30/2021   Status epilepticus (HCC) 05/22/2021   Alcohol abuse 01/11/2020   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HCC) 01/07/2020   Intentional overdose of drug in tablet form (HCC) 01/05/2020   Drug overdose, multiple drugs, intentional self-harm, initial encounter (HCC) 01/04/2020   Schizoaffective disorder, depressive type (HCC) 01/01/2020   Generalized anxiety disorder 01/01/2020   Severe episode of recurrent major depressive disorder, with psychotic features (HCC) 01/01/2020   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)    Focal seizures (HCC) 11/07/2019   Auditory hallucinations 07/30/2019   Goals of care, counseling/discussion 07/19/2019   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HCC) 07/01/2019   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/01/2019   Brain mass 06/30/2019   Attention deficit hyperactivity disorder (ADHD), combined type 03/23/2019   Mild episode of recurrent major depressive disorder (HCC) 03/23/2019   Tobacco abuse 10/04/2012   Allergic rhinitis 08/18/2012    ONSET DATE: 11/15/22  REFERRING DIAG: I62.00 (ICD-10-CM) - Nontraumatic subdural hemorrhage, unspecified  THERAPY DIAG:  Visuospatial deficit  Other lack of coordination  Frontal lobe and executive function deficit  Attention and concentration deficit  Rationale for Evaluation and Treatment: Rehabilitation  SUBJECTIVE:   SUBJECTIVE STATEMENT: Pt reports he left the puzzle at the front door but forgot to grab it on his way out.   Pt accompanied by: self  OUTPATIENT OCCUPATIONAL THERAPY NEURO TREATMENT  Patient Name: Stephen Beard MRN: [id number] DOB:04-Apr-1995, 27 y.o., male Today's Date: 12/26/2022  PCP: Morrell Riddle, PA-C REFERRING PROVIDER: Jacquelynn Cree, PA-C  END OF SESSION:  OT End of Session - 12/26/22 1314     Visit Number 6    Number of Visits 10    Date for OT Re-Evaluation 12/30/22    Authorization Type MEDICARE PART A AND B    OT Start Time 1315    OT Stop Time 1400    OT Time Calculation (min) 45 min    Activity Tolerance Patient tolerated treatment well    Behavior During Therapy WFL for tasks assessed/performed              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HCC)    GAD (generalized anxiety disorder)    Headache    Intentional overdose (HCC) 12/2019   Psychotic disorder (HCC)    Schizoaffective disorder (HCC)    Seizures (HCC)    Substance abuse (HCC)    Suicidal ideation    Past Surgical History:  Procedure Laterality Date   APPLICATION OF CRANIAL NAVIGATION N/A 07/02/2019   Procedure: APPLICATION OF CRANIAL NAVIGATION;  Surgeon: Jadene Pierini, MD;  [facility]: [facility];  Service: Neurosurgery;  Laterality: N/A;   CRANIOTOMY N/A 07/03/2019   Procedure: CRANIOTOMY HEMATOMA EVACUATION SUBDURAL;  Surgeon: Jadene Pierini, MD;  [facility]: [facility];  Service: Neurosurgery;  Laterality: N/A;   CRANIOTOMY Right 07/03/2019   Procedure: CRANIOTOMY HEMATOMA EVACUATION SUBDURAL;  Surgeon: Jadene Pierini, MD;  [facility]: [facility];  Service: Neurosurgery;  Laterality: Right;   CRANIOTOMY Right 07/02/2019   Procedure: CRANIOTOMY TUMOR EXCISION with Normajean Glasgow;  Surgeon: Jadene Pierini, MD;  Location: [facility];  Service: Neurosurgery;  Laterality: Right;  posterior   Patient Active Problem List   Diagnosis Date Noted   Nontraumatic subdural hemorrhage, unspecified (HCC) 11/07/2022   Acute respiratory failure with hypoxia  (HCC) 11/01/2022   AKI (acute kidney injury) (HCC) 11/01/2022   Headache 11/30/2021   Status epilepticus (HCC) 05/22/2021   Alcohol abuse 01/11/2020   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HCC) 01/07/2020   Intentional overdose of drug in tablet form (HCC) 01/05/2020   Drug overdose, multiple drugs, intentional self-harm, initial encounter (HCC) 01/04/2020   Schizoaffective disorder, depressive type (HCC) 01/01/2020   Generalized anxiety disorder 01/01/2020   Severe episode of recurrent major depressive disorder, with psychotic features (HCC) 01/01/2020   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)    Focal seizures (HCC) 11/07/2019   Auditory hallucinations 07/30/2019   Goals of care, counseling/discussion 07/19/2019   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HCC) 07/01/2019   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/01/2019   Brain mass 06/30/2019   Attention deficit hyperactivity disorder (ADHD), combined type 03/23/2019   Mild episode of recurrent major depressive disorder (HCC) 03/23/2019   Tobacco abuse 10/04/2012   Allergic rhinitis 08/18/2012    ONSET DATE: 11/15/22  REFERRING DIAG: I62.00 (ICD-10-CM) - Nontraumatic subdural hemorrhage, unspecified  THERAPY DIAG:  Visuospatial deficit  Other lack of coordination  Frontal lobe and executive function deficit  Attention and concentration deficit  Rationale for Evaluation and Treatment: Rehabilitation  SUBJECTIVE:   SUBJECTIVE STATEMENT: Pt reports he left the puzzle at the front door but forgot to grab it on his way out.   Pt accompanied by: self  OUTPATIENT OCCUPATIONAL THERAPY NEURO TREATMENT  Patient Name: Stephen Beard MRN: [id number] DOB:04-Apr-1995, 27 y.o., male Today's Date: 12/26/2022  PCP: Morrell Riddle, PA-C REFERRING PROVIDER: Jacquelynn Cree, PA-C  END OF SESSION:  OT End of Session - 12/26/22 1314     Visit Number 6    Number of Visits 10    Date for OT Re-Evaluation 12/30/22    Authorization Type MEDICARE PART A AND B    OT Start Time 1315    OT Stop Time 1400    OT Time Calculation (min) 45 min    Activity Tolerance Patient tolerated treatment well    Behavior During Therapy WFL for tasks assessed/performed              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HCC)    GAD (generalized anxiety disorder)    Headache    Intentional overdose (HCC) 12/2019   Psychotic disorder (HCC)    Schizoaffective disorder (HCC)    Seizures (HCC)    Substance abuse (HCC)    Suicidal ideation    Past Surgical History:  Procedure Laterality Date   APPLICATION OF CRANIAL NAVIGATION N/A 07/02/2019   Procedure: APPLICATION OF CRANIAL NAVIGATION;  Surgeon: Jadene Pierini, MD;  Location: [facility];  Service: Neurosurgery;  Laterality: N/A;   CRANIOTOMY N/A 07/03/2019   Procedure: CRANIOTOMY HEMATOMA EVACUATION SUBDURAL;  Surgeon: Jadene Pierini, MD;  Location: [facility];  Service: Neurosurgery;  Laterality: N/A;   CRANIOTOMY Right 07/03/2019   Procedure: CRANIOTOMY HEMATOMA EVACUATION SUBDURAL;  Surgeon: Jadene Pierini, MD;  Location: [facility];  Service: Neurosurgery;  Laterality: Right;   CRANIOTOMY Right 07/02/2019   Procedure: CRANIOTOMY TUMOR EXCISION with Normajean Glasgow;  Surgeon: Jadene Pierini, MD;  [facility]: [facility];  Service: Neurosurgery;  Laterality: Right;  posterior   Patient Active Problem List   Diagnosis Date Noted   Nontraumatic subdural hemorrhage, unspecified (HCC) 11/07/2022   Acute respiratory failure with hypoxia  (HCC) 11/01/2022   AKI (acute kidney injury) (HCC) 11/01/2022   Headache 11/30/2021   Status epilepticus (HCC) 05/22/2021   Alcohol abuse 01/11/2020   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HCC) 01/07/2020   Intentional overdose of drug in tablet form (HCC) 01/05/2020   Drug overdose, multiple drugs, intentional self-harm, initial encounter (HCC) 01/04/2020   Schizoaffective disorder, depressive type (HCC) 01/01/2020   Generalized anxiety disorder 01/01/2020   Severe episode of recurrent major depressive disorder, with psychotic features (HCC) 01/01/2020   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)    Focal seizures (HCC) 11/07/2019   Auditory hallucinations 07/30/2019   Goals of care, counseling/discussion 07/19/2019   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HCC) 07/01/2019   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/01/2019   Brain mass 06/30/2019   Attention deficit hyperactivity disorder (ADHD), combined type 03/23/2019   Mild episode of recurrent major depressive disorder (HCC) 03/23/2019   Tobacco abuse 10/04/2012   Allergic rhinitis 08/18/2012    ONSET DATE: 11/15/22  REFERRING DIAG: I62.00 (ICD-10-CM) - Nontraumatic subdural hemorrhage, unspecified  THERAPY DIAG:  Visuospatial deficit  Other lack of coordination  Frontal lobe and executive function deficit  Attention and concentration deficit  Rationale for Evaluation and Treatment: Rehabilitation  SUBJECTIVE:   SUBJECTIVE STATEMENT: Pt reports he left the puzzle at the front door but forgot to grab it on his way out.   Pt accompanied by: self  OUTPATIENT OCCUPATIONAL THERAPY NEURO TREATMENT  Patient Name: Stephen Beard MRN: [id number] DOB:04-Apr-1995, 27 y.o., male Today's Date: 12/26/2022  PCP: Morrell Riddle, PA-C REFERRING PROVIDER: Jacquelynn Cree, PA-C  END OF SESSION:  OT End of Session - 12/26/22 1314     Visit Number 6    Number of Visits 10    Date for OT Re-Evaluation 12/30/22    Authorization Type MEDICARE PART A AND B    OT Start Time 1315    OT Stop Time 1400    OT Time Calculation (min) 45 min    Activity Tolerance Patient tolerated treatment well    Behavior During Therapy WFL for tasks assessed/performed              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HCC)    GAD (generalized anxiety disorder)    Headache    Intentional overdose (HCC) 12/2019   Psychotic disorder (HCC)    Schizoaffective disorder (HCC)    Seizures (HCC)    Substance abuse (HCC)    Suicidal ideation    Past Surgical History:  Procedure Laterality Date   APPLICATION OF CRANIAL NAVIGATION N/A 07/02/2019   Procedure: APPLICATION OF CRANIAL NAVIGATION;  Surgeon: Jadene Pierini, MD;  [facility]: [facility];  Service: Neurosurgery;  Laterality: N/A;   CRANIOTOMY N/A 07/03/2019   Procedure: CRANIOTOMY HEMATOMA EVACUATION SUBDURAL;  Surgeon: Jadene Pierini, MD;  [facility];  Service: Neurosurgery;  Laterality: N/A;   CRANIOTOMY Right 07/03/2019   Procedure: CRANIOTOMY HEMATOMA EVACUATION SUBDURAL;  Surgeon: Jadene Pierini, MD;  [facility]: [facility];  Service: Neurosurgery;  Laterality: Right;   CRANIOTOMY Right 07/02/2019   Procedure: CRANIOTOMY TUMOR EXCISION with Normajean Glasgow;  Surgeon: Jadene Pierini, MD;  Location: [facility];  Service: Neurosurgery;  Laterality: Right;  posterior   Patient Active Problem List   Diagnosis Date Noted   Nontraumatic subdural hemorrhage, unspecified (HCC) 11/07/2022   Acute respiratory failure with hypoxia  (HCC) 11/01/2022   AKI (acute kidney injury) (HCC) 11/01/2022   Headache 11/30/2021   Status epilepticus (HCC) 05/22/2021   Alcohol abuse 01/11/2020   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HCC) 01/07/2020   Intentional overdose of drug in tablet form (HCC) 01/05/2020   Drug overdose, multiple drugs, intentional self-harm, initial encounter (HCC) 01/04/2020   Schizoaffective disorder, depressive type (HCC) 01/01/2020   Generalized anxiety disorder 01/01/2020   Severe episode of recurrent major depressive disorder, with psychotic features (HCC) 01/01/2020   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)    Focal seizures (HCC) 11/07/2019   Auditory hallucinations 07/30/2019   Goals of care, counseling/discussion 07/19/2019   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HCC) 07/01/2019   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/01/2019   Brain mass 06/30/2019   Attention deficit hyperactivity disorder (ADHD), combined type 03/23/2019   Mild episode of recurrent major depressive disorder (HCC) 03/23/2019   Tobacco abuse 10/04/2012   Allergic rhinitis 08/18/2012    ONSET DATE: 11/15/22  REFERRING DIAG: I62.00 (ICD-10-CM) - Nontraumatic subdural hemorrhage, unspecified  THERAPY DIAG:  Visuospatial deficit  Other lack of coordination  Frontal lobe and executive function deficit  Attention and concentration deficit  Rationale for Evaluation and Treatment: Rehabilitation  SUBJECTIVE:   SUBJECTIVE STATEMENT: Pt reports he left the puzzle at the front door but forgot to grab it on his way out.   Pt accompanied by: self

## 2022-12-26 NOTE — Therapy (Signed)
OUTPATIENT PHYSICAL THERAPY NEURO TREATMENT   Patient Name: Stephen Beard MRN: [id number] DOB:11-05-1995, 27 y.o., male Today's Date: 12/26/2022   PCP: Morrell Riddle, PA-C   REFERRING PROVIDER:  Jacquelynn Cree, PA-C  END OF SESSION:  PT End of Session - 12/26/22 1241     Visit Number 6    Number of Visits 7    Date for PT Re-Evaluation 01/15/23    Authorization Type Medicare    PT Start Time 1240   pt late to session   PT Stop Time 1314    PT Time Calculation (min) 34 min    Equipment Utilized During Treatment Gait belt    Activity Tolerance Patient tolerated treatment well    Behavior During Therapy WFL for tasks assessed/performed                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HCC)    GAD (generalized anxiety disorder)    Headache    Intentional overdose (HCC) 12/2019   Psychotic disorder (HCC)    Schizoaffective disorder (HCC)    Seizures (HCC)    Substance abuse (HCC)    Suicidal ideation    Past Surgical History:  Procedure Laterality Date   APPLICATION OF CRANIAL NAVIGATION N/A 07/02/2019   Procedure: APPLICATION OF CRANIAL NAVIGATION;  Surgeon: Jadene Pierini, MD;  Location: [facility] OR;  Service: Neurosurgery;  Laterality: N/A;   CRANIOTOMY N/A 07/03/2019   Procedure: CRANIOTOMY HEMATOMA EVACUATION SUBDURAL;  Surgeon: Jadene Pierini, MD;  Location: [facility] OR;  Service: Neurosurgery;  Laterality: N/A;   CRANIOTOMY Right 07/03/2019   Procedure: CRANIOTOMY HEMATOMA EVACUATION SUBDURAL;  Surgeon: Jadene Pierini, MD;  Location: [facility] OR;  Service: Neurosurgery;  Laterality: Right;   CRANIOTOMY Right 07/02/2019   Procedure: CRANIOTOMY TUMOR EXCISION with Normajean Glasgow;  Surgeon: Jadene Pierini, MD;  Location: [facility] OR;  Service: Neurosurgery;  Laterality: Right;  posterior   Patient Active Problem List   Diagnosis Date Noted   Nontraumatic subdural hemorrhage,  unspecified (HCC) 11/07/2022   Acute respiratory failure with hypoxia (HCC) 11/01/2022   AKI (acute kidney injury) (HCC) 11/01/2022   Headache 11/30/2021   Status epilepticus (HCC) 05/22/2021   Alcohol abuse 01/11/2020   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HCC) 01/07/2020   Intentional overdose of drug in tablet form (HCC) 01/05/2020   Drug overdose, multiple drugs, intentional self-harm, initial encounter (HCC) 01/04/2020   Schizoaffective disorder, depressive type (HCC) 01/01/2020   Generalized anxiety disorder 01/01/2020   Severe episode of recurrent major depressive disorder, with psychotic features (HCC) 01/01/2020   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)    Focal seizures (HCC) 11/07/2019   Auditory hallucinations 07/30/2019   Goals of care, counseling/discussion 07/19/2019   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HCC) 07/01/2019   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/01/2019   Brain mass 06/30/2019   Attention deficit hyperactivity disorder (ADHD), combined type 03/23/2019   Mild episode of recurrent major depressive disorder (HCC) 03/23/2019   Tobacco abuse 10/04/2012   Allergic rhinitis 08/18/2012    ONSET DATE: 11/11/2022  REFERRING DIAG: I62.00 (ICD-10-CM) - Nontraumatic subdural hemorrhage, unspecified  THERAPY DIAG:  Muscle weakness (generalized)  Unsteadiness on feet  Other abnormalities of gait and mobility  Rationale for Evaluation and Treatment: Rehabilitation  SUBJECTIVE:  SUBJECTIVE STATEMENT: No changes since he was last here. Been better going up and down the stairs. Wants to get back to trying to run. Has been trying to run short distances in his apartment complex. Exercises are going well at home.   Pt accompanied by:  self  PERTINENT HISTORY: Pt presented on 10/31/22 with seizure >30 mins. EEG consistent with epilepsy and cortical dysfunction arising from R hemisphere, also moderate diffuse encephalopathy (same as EEG on admission a year ago). MRI showed trace SDH along the R parietal convexity. 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 No   PRECAUTIONS: Fall and Other: hx of seizures     WEIGHT BEARING RESTRICTIONS: No  FALLS: Has patient fallen in last 6 months? Yes. Number of falls 3 or 4, did not hurt himself   LIVING ENVIRONMENT: Lives with:  lives with mom  Lives in: House/apartment Stairs: No Has following equipment at home: None  PLOF: Independent with community mobility without device and has not driven since before his brain tumor surgery (2021)  PATIENT GOALS: Wants to have good balance again, wants to get his thighs good   OBJECTIVE:   DIAGNOSTIC FINDINGS: MRI brain 11/01/22 IMPRESSION: 1. Unchanged 6 mm focus of acute hemorrhage along the anterior margin of the right occipital resection cavity. No new or nodular enhancement to suggest local recurrence. 2. Trace subdural hemorrhage along the right parietal convexity.  COGNITION: Overall cognitive status: Within functional limits for tasks assessed and History of cognitive impairments - at baseline   SENSATION: Light touch: WFL and bilat LE  Sometimes will get numbness in L arm   COORDINATION: Nose to finger: Dysmetric LLE Heel to shin: WNL    POSTURE: No Significant postural limitations   LOWER EXTREMITY MMT:    MMT Right Eval Left Eval  Hip flexion 5 5  Hip extension    Hip abduction 4+ 4+  Hip adduction 4 4  Hip internal rotation    Hip external rotation     Knee flexion 5 4+  Knee extension 4+ 4+  Ankle dorsiflexion 5 5  Ankle plantarflexion    Ankle inversion    Ankle eversion    (Blank rows = not tested)  All tested in sitting Noted incr tremors with hip ADD/ABD    TRANSFERS: Assistive device utilized: None  Sit to stand: Complete Independence Stand to sit: Complete Independence Noted pt having more weight through R side and RLE staggered posteriorly    STAIRS: Level of Assistance: SBA Stair Negotiation Technique: Alternating Pattern  Forwards with No Rails Single Rail on Right Number of Stairs: 4 x 2 reps = 8 total   Height of Stairs: 6  Comments: Pt slower to descend due to weakness, balance, and visual deficits when not using handrail   GAIT: Gait pattern: decreased arm swing- Left, decreased stride length, lateral hip instability, and decreased trunk rotation Distance walked: Clinic distances  Assistive device utilized: None Level of assistance: Modified independence Comments: No overt unsteadiness noted   FUNCTIONAL TESTS:  5 times sit to stand: 10.8 seconds with no UE support  10 meter walk test: 10.2 seconds = 3.22 ft/sec  SLS: RLE and LLE: 1-2 seconds    [facility] PT Assessment - 12/26/22 1250       Functional Gait  Assessment  Gait assessed  Yes    Gait Level Surface Walks 20 ft in less than 5.5 sec, no assistive devices, good speed, no evidence for imbalance, normal gait pattern, deviates no more than 6 in outside of the 12 in walkway width.    Change in Gait Speed Able to smoothly change walking speed without loss of balance or gait deviation. Deviate no more than 6 in outside of the 12 in walkway width.    Gait with Horizontal Head Turns Performs head turns smoothly with no change in gait. Deviates no more than 6 in outside 12 in walkway width    Gait with Vertical Head Turns Performs head turns with no change in gait. Deviates no more than 6 in outside 12 in walkway width.    Gait and Pivot Turn Pivot turns  safely within 3 sec and stops quickly with no loss of balance.    Step Over Obstacle Is able to step over 2 stacked shoe boxes taped together (9 in total height) without changing gait speed. No evidence of imbalance.    Gait with Narrow Base of Support Ambulates 4-7 steps.    Gait with Eyes Closed Walks 20 ft, uses assistive device, slower speed, mild gait deviations, deviates 6-10 in outside 12 in walkway width. Ambulates 20 ft in less than 9 sec but greater than 7 sec.    Ambulating Backwards Walks 20 ft, no assistive devices, good speed, no evidence for imbalance, normal gait    Steps Alternating feet, no rail.    Total Score 27    FGA comment: 27/30 = Low Fall Risk              M-CTSIB  Condition 1: Firm Surface, EO 30 Sec, Normal Sway  Condition 2: Firm Surface, EC 30 Sec, Normal Sway  Condition 3: Foam Surface, EO 30 Sec, Mild Sway  Condition 4: Foam Surface, EC 13 Sec       TODAY'S TREATMENT:                                                                                                                              Therapeutic Activity:   Condition 4 of mCTSIB: 20.3 seconds  Gait speed: 9.72 seconds = 3.37 ft/sec   [facility] PT Assessment - 12/26/22 1250       Functional Gait  Assessment   Gait assessed  Yes    Gait Level Surface Walks 20 ft in less than 5.5 sec, no assistive devices, good speed, no evidence for imbalance, normal gait pattern, deviates no more than 6 in outside of the 12 in walkway width.    Change in Gait Speed Able to smoothly change walking speed without loss of balance or gait deviation. Deviate no more than 6 in outside of the 12 in walkway width.    Gait with Horizontal Head Turns Performs head turns smoothly with no change in gait. Deviates no more than 6 in outside 12  in walkway width    Gait with Vertical Head Turns Performs head turns with no change in gait. Deviates no more than 6 in outside 12 in walkway width.    Gait and Pivot Turn Pivot turns  safely within 3 sec and stops quickly with no loss of balance.    Step Over Obstacle Is able to step over 2 stacked shoe boxes taped together (9 in total height) without changing gait speed. No evidence of imbalance.    Gait with Narrow Base of Support Ambulates 4-7 steps.    Gait with Eyes Closed Walks 20 ft, uses assistive device, slower speed, mild gait deviations, deviates 6-10 in outside 12 in walkway width. Ambulates 20 ft in less than 9 sec but greater than 7 sec.    Ambulating Backwards Walks 20 ft, no assistive devices, good speed, no evidence for imbalance, normal gait    Steps Alternating feet, no rail.    Total Score 27    FGA comment: 27/30 = Low Fall Risk               STAIRS:  Level of Assistance: Modified independence  Stair Negotiation Technique: Alternating Pattern  with No Rails  Number of Stairs: 3 sets of 4 stairs = 12 total   Height of Stairs: 6" step   Comments: Pt able to perform with no rails, improved eccentric control when lowering   Performed jogging in hallway 2 x 30' with pt able to perform with supervision with wider BOS, decr push off and heel strike. Pt reporting feeling better with jogging in clinic compared to when has has been jogging outside on unlevel surfaces.     Added below exercises to HEP:  - Single Leg Heel Raise with Counter Support  - 1 x daily - 5 x weekly - 2 sets - 10 reps, pt needing UE support for balance - Standing March  - 1 x daily - 5 x weekly - 1-2 sets - 10 reps - on air ex/compliant surface, cues for slowed pace  PATIENT EDUCATION:  Education details: Results of goals, adding 1 more appt to make up for missed one last week, additions to HEP for PF strength for jogging and SLS  Person educated: Patient Education method: Explanation, Demonstration, Tactile cues, Verbal cues, and Handouts Education comprehension: verbalized understanding, returned demonstration, verbal cues required, tactile cues required, and needs further  education  HOME EXERCISE PROGRAM:  Access Code: [email or web address] URL: [email or web address] Date: 12/26/2022 Prepared by: Sherlie Ban  Exercises - Side Stepping with Resistance at Ankles  - 1 x daily - 7 x weekly - 1 sets - 3 reps - Reverse Band Walks  - 1 x daily - 7 x weekly - 1 sets - 3 reps - Band Walks  - 1 x daily - 7 x weekly - 1 sets - 3 reps - Standing Terminal Knee Extension with Resistance  - 1 x daily - 7 x weekly - 3 sets - 10 reps - Standing Hip Flexor Stretch  - 1 x daily - 7 x weekly - 1 sets - 10 reps - Romberg Stance Eyes Closed on Foam Pad  - 1 x daily - 7 x weekly - 1 sets - 3 reps - 30 hold - Full Plank  - 1 x daily - 7 x weekly - 3 reps - 30 hold - Bird Dog  - 1 x daily - 7 x weekly - 1 sets - 10 reps - Clamshell  - 1 x daily -  7 x weekly - 2 sets - 10 reps - Staggered Sit-to-Stand  - 1 x daily - 7 x weekly - 2 sets - 10 reps - Forward Step Touch  - 1 x daily - 7 x weekly - 3 sets - 10 reps - Bird Dog (Mirrored)  - 1 x daily - 7 x weekly - 1 sets - 10 reps     GOALS: Goals reviewed with patient? Yes  SHORT TERM GOALS: ALL STGS = LTGS  LONG TERM GOALS: Target date: 12/14/2022 Updated goal date: 01/02/23  Pt will be independent with final HEP for strength/balance/gait in order to build upon functional gains made in therapy. Baseline:  Goal status: IN PROGRESS  2.  Pt will improve FGA to at least a 29/30 in order to demo decr fall risk. Baseline: 26/30  27/30 (10/14) Goal status: IN PROGRESS  3.  Pt will perform 8 steps with no handrail and reciprocal pattern with mod I in order to demo improved balance.  Baseline:  Goal status: MET  4.  Pt will improve condition 4 of mCTSIB to at least 25 seconds to demo improved vestibular input for balance.  Baseline: 13 seconds   20.3 seconds (10/14) Goal status: IN PROGRESS  5.  Pt will improve gait speed with no to at least 3.5 ft/sec in order to demo improved community mobility.  Baseline: 3.22  ft/sec  9.72 seconds = 3.37 ft/sec Goal status: IN PROGRESS   ASSESSMENT:  CLINICAL IMPRESSION: Today's skilled session focused on assessing LTGs. Pt met LTG #3. Pt able to perform steps in a reciprocal pattern with no handrail with mod I. Pt also reports improvements in being able to perform steps. Remainder of LTGs in progress. Pt improved FGA from a 26 > 27, but pt still with difficulty with narrow BOS and EC tasks. Pt improved condition 4 of mCTSIB to 20.3 seconds (previously was 13 seconds). Pt improved gait speed from 3.22 > 3.37 ft/sec, but not quite to goal level. Pt wants to return to jogging, pt able to jog during level surfaces in clinic with supervision. Pt with decr push off, added SLS tasks and calf raises to HEP to work on return to jogging. Will continue per POC.   OBJECTIVE IMPAIRMENTS: Abnormal gait, decreased activity tolerance, decreased balance, decreased cognition, decreased coordination, decreased strength, and impaired sensation.   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   REHAB POTENTIAL: Good  CLINICAL DECISION MAKING: Evolving/moderate complexity  EVALUATION COMPLEXITY: Moderate  PLAN:  PT FREQUENCY: 1-2x/week  PT DURATION: 8 weeks - due to delay in scheduling   PLANNED INTERVENTIONS: Therapeutic exercises, Therapeutic activity, Neuromuscular re-education, Balance training, Gait training, Patient/Family education, Self Care, Joint mobilization, Stair training, Vestibular training, Manual therapy, and  Re-evaluation  PLAN FOR NEXT SESSION: work on SLS tasks, narrow BOS, return to jogging? Maybe D/C at next session   Sherlie Ban, PT, DPT 12/26/22 3:40 PM

## 2022-12-26 NOTE — Patient Instructions (Addendum)
Visual Scanning Shape Activity   HOMEWORK SHEET PROVIDED

## 2022-12-27 ENCOUNTER — Other Ambulatory Visit: Payer: Self-pay

## 2022-12-28 ENCOUNTER — Ambulatory Visit: Payer: Medicare Other | Admitting: Occupational Therapy

## 2022-12-28 DIAGNOSIS — R41842 Visuospatial deficit: Secondary | ICD-10-CM

## 2022-12-28 DIAGNOSIS — R278 Other lack of coordination: Secondary | ICD-10-CM

## 2022-12-28 DIAGNOSIS — M6281 Muscle weakness (generalized): Secondary | ICD-10-CM

## 2022-12-28 NOTE — Therapy (Signed)
OUTPATIENT OCCUPATIONAL THERAPY NEURO TREATMENT  Patient Name: Stephen Beard MRN: [id number] DOB:1995-08-18, 27 y.o., male Today's Date: 12/28/2022  PCP: Morrell Riddle, PA-C REFERRING PROVIDER: Jacquelynn Cree, PA-C  END OF SESSION:  OT End of Session - 12/28/22 1322     Visit Number 7    Number of Visits 10    Date for OT Re-Evaluation 12/30/22    Authorization Type MEDICARE PART A AND B    OT Start Time 1322    OT Stop Time 1400    OT Time Calculation (min) 38 min    Activity Tolerance Patient tolerated treatment well    Behavior During Therapy WFL for tasks assessed/performed              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HCC)    GAD (generalized anxiety disorder)    Headache    Intentional overdose (HCC) 12/2019   Psychotic disorder (HCC)    Schizoaffective disorder (HCC)    Seizures (HCC)    Substance abuse (HCC)    Suicidal ideation    Past Surgical History:  Procedure Laterality Date   APPLICATION OF CRANIAL NAVIGATION N/A 07/02/2019   Procedure: APPLICATION OF CRANIAL NAVIGATION;  Surgeon: Jadene Pierini, MD;  Location: [facility] OR;  Service: Neurosurgery;  Laterality: N/A;   CRANIOTOMY N/A 07/03/2019   Procedure: CRANIOTOMY HEMATOMA EVACUATION SUBDURAL;  Surgeon: Jadene Pierini, MD;  Location: [facility] OR;  Service: Neurosurgery;  Laterality: N/A;   CRANIOTOMY Right 07/03/2019   Procedure: CRANIOTOMY HEMATOMA EVACUATION SUBDURAL;  Surgeon: Jadene Pierini, MD;  Location: [facility] OR;  Service: Neurosurgery;  Laterality: Right;   CRANIOTOMY Right 07/02/2019   Procedure: CRANIOTOMY TUMOR EXCISION with Normajean Glasgow;  Surgeon: Jadene Pierini, MD;  Location: [facility] OR;  Service: Neurosurgery;  Laterality: Right;  posterior   Patient Active Problem List   Diagnosis Date Noted   Nontraumatic subdural hemorrhage, unspecified (HCC) 11/07/2022   Acute respiratory failure with hypoxia  (HCC) 11/01/2022   AKI (acute kidney injury) (HCC) 11/01/2022   Headache 11/30/2021   Status epilepticus (HCC) 05/22/2021   Alcohol abuse 01/11/2020   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HCC) 01/07/2020   Intentional overdose of drug in tablet form (HCC) 01/05/2020   Drug overdose, multiple drugs, intentional self-harm, initial encounter (HCC) 01/04/2020   Schizoaffective disorder, depressive type (HCC) 01/01/2020   Generalized anxiety disorder 01/01/2020   Severe episode of recurrent major depressive disorder, with psychotic features (HCC) 01/01/2020   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)    Focal seizures (HCC) 11/07/2019   Auditory hallucinations 07/30/2019   Goals of care, counseling/discussion 07/19/2019   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HCC) 07/01/2019   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/01/2019   Brain mass 06/30/2019   Attention deficit hyperactivity disorder (ADHD), combined type 03/23/2019   Mild episode of recurrent major depressive disorder (HCC) 03/23/2019   Tobacco abuse 10/04/2012   Allergic rhinitis 08/18/2012    ONSET DATE: 11/15/22  REFERRING DIAG: I62.00 (ICD-10-CM) - Nontraumatic subdural hemorrhage, unspecified  THERAPY DIAG:  Visuospatial deficit  Other lack of coordination  Muscle weakness (generalized)  Rationale for Evaluation and Treatment: Rehabilitation  SUBJECTIVE:   SUBJECTIVE STATEMENT: Pt reported his father passed away this past 02/17/2023 and he needed to get out of the house.   Pt accompanied by: self  PERTINENT HISTORY:   Pt presented to the hospital  on 10/31/22 with seizure >30 mins. EEG consistent with epilepsy and cortical dysfunction arising from R hemisphere, also moderate diffuse encephalopathy (same as EEG on admission a year ago). MRI showed trace SDH along the R parietal convexity.  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 No   FALLS: Has patient fallen in last 6 months? Yes. Number of falls maybe 1 in the past 6 months   He has fallen several times before that but relates it to his vision and not seeing something right. Has to go see an ophthalmologist    LIVING ENVIRONMENT: Lives with: lives with their family (Parents & uncle with DS) Lives in: Apartment Stairs: No Has following equipment at home: None  PLOF: Independent - worked at [employer] (prior to resection/seizures in 2021).  Pt has not driven since then due to seizures and visual deficits.  Prior to most recent seizure, he was not working but has been volunteering at [employer].   PATIENT GOALS: Patient reports he wants to work on his cognition ie) understanding directions; his vision; and his balance.  OBJECTIVE:   HAND DOMINANCE: Right  ADLs: Overall ADLs: Independent Transfers/ambulation related to ADLs: Independent Tub Shower transfers: MI - holds the wall for support Equipment: none  IADLs:  Driving: Not driving x 3 years due to seizures and vision; reports that he has been unable to use [employer] [place] due to transportation not coming to their home Shopping: Accompanies parent (mom & step dad) to go shopping Light housekeeping: Helps do his own laundry,  Meal Prep: Cooks a little - does not use the air fryer Community mobility: Ind Medication management: Takes his own medication but does report that he has missed some medication in the past Financial management: Not reported Handwriting:  Not tested  MOBILITY STATUS: Independent and Hx of falls  POSTURE COMMENTS:  No Significant postural limitations Sitting balance: WFL  ACTIVITY TOLERANCE: Activity tolerance:  Good  UPPER EXTREMITY ROM:    Active ROM Right eval Left eval  Shoulder flexion WFL   Shoulder abduction   Shoulder adduction   Shoulder extension   Shoulder internal rotation   Shoulder external rotation   Elbow flexion   Elbow extension   Wrist flexion   Wrist extension   Wrist ulnar deviation   Wrist radial deviation   Wrist pronation   Wrist supination   (Blank rows = not tested)  UPPER EXTREMITY MMT:     MMT Right eval Left eval  Shoulder flexion 4 4  Shoulder abduction    Shoulder adduction    Shoulder extension    Shoulder internal rotation    Shoulder external rotation    Middle trapezius    Lower trapezius    Elbow flexion 4 4  Elbow extension    Wrist flexion    Wrist extension    Wrist ulnar deviation    Wrist radial deviation    Wrist pronation    Wrist supination    (Blank rows = not tested)  HAND FUNCTION: Grip strength: Right: 59.3, 55.5, 62.3  lbs; Left: 60.8, 56.2, 69.4  lbs Average: Right: 59.0 lbs Left 62.1 lbs 12/26/22 Right 45.1, 49.8, 49.8,  Left 76.4, 55.9, 64.1 Average Right: 48.2 lbs Left: 65.5 lbs  COORDINATION: 9 Hole Peg test: Right: 34.5 sec; Left: 33.7 sec 12/26/22 Right: 32.29 - 32.66 sec Left: 34.14 - 37.9 sec   SENSATION: WFL  EDEMA: NA  MUSCLE TONE: WFL  COGNITION: Overall cognitive status: Within functional limits for tasks assessed  VISION: Subjective report: Patient can see distance better than up close. He does report need to see an ophthalmologist and reports that his L eye is worse than R Baseline vision: Wears glasses all the time Visual history:  Impaired since brain tumor.  Does report it was pressing on his L eye  VISION ASSESSMENT: Impaired Tracking/Visual pursuits: Decreased smoothness with horizontal tracking and especially towards the R side  Convergence ~ 12"  Inferior/superior R side intact, L side - full visual field cut for approaching objects from L side  Patient has difficulty with  following activities due to following visual impairments: finding objects from L side   PERCEPTION: Not tested  PRAXIS: Not tested  OBSERVATIONS: Pt ambulates with no AE with no loss of balance. The pt appears well kept and brought his new calendar where he had his upcoming therapy appts listed.     TODAY'S TREATMENT:                                                                                                                               Therapeutic Activities:  Retested Grip strength and Coordination with 9 hole peg test again today as varied results were noted this past Monday but OTR was not aware that patient's father had passed away and pt was not feeling great.  Improvements noted in all areas - see values in objective data below (under goals).   OT engaged patient on visual scanning with success at locating 90% of the correct letter (q) from similar letters (a, b, p) over 5 rows - missing only 2 letters on the left but able to self correct with additional scan on his own.  OT educated patient on general vision recommendations including increased/improved lighting, increased contrast, and reduction of visual clutter.  Furthermore, education was provided on compensatory strategies including turning head to left to compensate for field cut as well as use of scanning to improve safety with functional mobility and accurate location of items to promote visual neuro rehabilitation following seizures.  Pt also provided visual contrast from colored divider to help isolate lines during reading, areas on pages ie) 1 column at a time with Bible, and improving awareness of L side of pages etc.      PATIENT EDUCATION: Education details: visual scanning strategies Person educated: Patient Education method: Explanation, Demonstration, Verbal cues, and Handouts  Education comprehension: verbalized understanding, returned demonstration, verbal cues required, and needs further education  HOME  EXERCISE PROGRAM: 12/05/22: Coordination with images; Putty Exercises Access Code: [email or web address] 12/14/22: BUE Theraband Exercises (see pt instructions). Access Code: [email or web address] 12/21/22: visual scanning strategies 12/26/22: Visual Scanning Activity   GOALS: Goals reviewed with patient? Yes  SHORT TERM GOALS: Target date: 12/16/22  Patient will demonstrate BUE strength (theraband) and coordination HEP with 25% verbal cues or less for proper execution.  Baseline: New to OT Goal status: MET  2.  Patient will demonstrate vision specific HEP with 25% verbal cues or less for proper execution.  Baseline: New to OT - L visual deficits Goal status: MET  3.  Patient will demonstrate at least 5-10 lbs improvement in grip strength as needed to open jars and other containers, carry multiple grocery bags etc. Baseline: Right: 59.0 lbs Left 62.1 lbs Goal status: MET Right 59.7, 69.0, 74.7 Average 67.8 lbs Left 65.6, 76.0, 63.4 Average 68.3 lbs   LONG TERM GOALS: Target date: 01/06/23  Patient will independently recall at least 2 compensatory strategies for visual impairment without cueing. Baseline: L visual field cut with h/o falls r/t vision Goal status: Met Scanning - using visual line and scanning environment  2. Patient will return demonstration of visual adaptation use and verbalize understanding of how to obtain item(s) if desired.  Baseline: New to outpt OT Goal status: MET  3.  Patient will complete nine-hole peg with use of B in less than 30 seconds.  Baseline: Right: 34.5 sec; Left: 33.7 sec Goal status: MET  Right: 26.20 sec Left: 33.30 sec  4.  Pt will verbalize understanding of ways to keep thinking skills sharp and ways to compensate for STM changes. Baseline: not yet initiated Goal status: MET Memory book and calendar/planner   ASSESSMENT:  CLINICAL IMPRESSION: Patient is a 27 y.o. male who was seen today for occupational therapy treatment following recent seizure  activity. Pt provided discharge instructions re: visual perceptual/scanning task, memory and HEP ideas to maintain gains made during outpt OT and is set for discharge from OT today.    PERFORMANCE DEFICITS: in functional skills including coordination, dexterity, proprioception, strength, Fine motor control, Gross motor control, decreased knowledge of use of DME, vision, and UE functional use, cognitive skills including emotional, memory, problem solving, and compensatory strategies (h/o drug use) , and psychosocial skills including coping strategies.   IMPAIRMENTS: are limiting patient from IADLs, work, leisure, and social participation.   CO-MORBIDITIES: has co-morbidities such as seizures and visual deficits  that affects occupational performance. Patient will benefit from skilled OT to address above impairments and improve overall function.  REHAB POTENTIAL: Excellent   PLAN:   OCCUPATIONAL THERAPY DISCHARGE SUMMARY  Visits from Start of Care: 7  Current functional level related to goals / functional outcomes: Pt has met all goals 3/3 short term and 4/4 long term) to satisfactory levels and is pleased with outcomes.  Remaining deficits: Pt has tools to handle ongoing functional deficits in vision, memory and overall strength/coordination.  He is also encouraged to seek help via counseling as needed for emotion concerns especially with loss of his father.  Education / Equipment: Pt has all needed materials and education. Pt understands how to continue on with self-management. See tx notes for more details.   Patient agrees to discharge due to max benefits received from outpatient occupational therapy at this time.   Victorino Sparrow, OT 12/28/2022, 7:06 PM

## 2023-01-02 ENCOUNTER — Encounter (HOSPITAL_COMMUNITY): Payer: Self-pay

## 2023-01-02 ENCOUNTER — Other Ambulatory Visit: Payer: Self-pay

## 2023-01-02 ENCOUNTER — Encounter: Payer: Self-pay | Admitting: Physical Therapy

## 2023-01-02 ENCOUNTER — Ambulatory Visit: Payer: Self-pay | Admitting: Physical Therapy

## 2023-01-02 ENCOUNTER — Ambulatory Visit: Payer: Medicare Other | Admitting: Physical Therapy

## 2023-01-02 ENCOUNTER — Emergency Department (HOSPITAL_COMMUNITY)
Admission: EM | Admit: 2023-01-02 | Discharge: 2023-01-02 | Discharge status: Against Medical Advice | Payer: Medicare Other | Attending: Emergency Medicine | Admitting: Emergency Medicine

## 2023-01-02 DIAGNOSIS — Z5321 Procedure and treatment not carried out due to patient leaving prior to being seen by health care provider: Secondary | ICD-10-CM | POA: Insufficient documentation

## 2023-01-02 DIAGNOSIS — R2681 Unsteadiness on feet: Secondary | ICD-10-CM

## 2023-01-02 DIAGNOSIS — H6002 Abscess of left external ear: Secondary | ICD-10-CM | POA: Diagnosis present

## 2023-01-02 DIAGNOSIS — M6281 Muscle weakness (generalized): Secondary | ICD-10-CM

## 2023-01-02 DIAGNOSIS — R2689 Other abnormalities of gait and mobility: Secondary | ICD-10-CM

## 2023-01-02 MED ORDER — IBUPROFEN 400 MG PO TABS
600.0000 mg | ORAL_TABLET | Freq: Once | ORAL | Status: AC
  Administered 2023-01-02: 600 mg via ORAL
  Filled 2023-01-02: qty 1

## 2023-01-02 NOTE — ED Notes (Signed)
Pt called 3x and got no response

## 2023-01-02 NOTE — ED Triage Notes (Signed)
Pt c/o abscess behind left ear. The abscess on left ear is approx the size of a nickel

## 2023-01-02 NOTE — ED Provider Triage Note (Cosign Needed)
Emergency Medicine Provider Triage Evaluation Note  Stephen Beard , a 27 y.o. male  was evaluated in triage.  Pt complains of abscess behind left ear. Started a month ago, opened, became smaller and reaccumulated over time. No fever  Review of Systems  Positive: abscess Negative: fever  Physical Exam  BP 114/79 (BP Location: Right Arm)   Pulse 81   Temp 98.8 F (37.1 C)   Resp 16   Ht 5\' 7"  (1.702 m)   Wt 60.3 kg   SpO2 99%   BMI 20.82 kg/m  Gen:   Awake, no distress   Resp:  Normal effort  MSK:   Moves extremities without difficulty  Other:  Large fluctuant collection posterior left ear c/w abscess.   Medical Decision Making  Medically screening exam initiated at 4:06 PM.  Appropriate orders placed.  Stephen Beard was informed that the remainder of the evaluation will be completed by another provider, this initial triage assessment does not replace that evaluation, and the importance of remaining in the ED until their evaluation is complete.  I&D needed   Elpidio Anis, PA-C 01/02/23 [id number]

## 2023-01-02 NOTE — Therapy (Signed)
OUTPATIENT PHYSICAL THERAPY NEURO TREATMENT/DISCHARGE SUMMARY   Patient Name: Stephen Beard MRN: [id number] DOB:September 21, 1995, 27 y.o., male Today's Date: 01/02/2023   PCP: Morrell Riddle, PA-C   REFERRING PROVIDER:  Jacquelynn Cree, PA-C  END OF SESSION:  PT End of Session - 01/02/23 1329     Visit Number 7    Number of Visits 7    Date for PT Re-Evaluation 01/15/23    Authorization Type Medicare    PT Start Time 1328   pt late to session   PT Stop Time 1359    PT Time Calculation (min) 31 min    Equipment Utilized During Treatment Gait belt    Activity Tolerance Patient tolerated treatment well    Behavior During Therapy WFL for tasks assessed/performed                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HCC)    GAD (generalized anxiety disorder)    Headache    Intentional overdose (HCC) 12/2019   Psychotic disorder (HCC)    Schizoaffective disorder (HCC)    Seizures (HCC)    Substance abuse (HCC)    Suicidal ideation    Past Surgical History:  Procedure Laterality Date   APPLICATION OF CRANIAL NAVIGATION N/A 07/02/2019   Procedure: APPLICATION OF CRANIAL NAVIGATION;  Surgeon: Jadene Pierini, MD;  Location: [facility] OR;  Service: Neurosurgery;  Laterality: N/A;   CRANIOTOMY N/A 07/03/2019   Procedure: CRANIOTOMY HEMATOMA EVACUATION SUBDURAL;  Surgeon: Jadene Pierini, MD;  Location: [facility] OR;  Service: Neurosurgery;  Laterality: N/A;   CRANIOTOMY Right 07/03/2019   Procedure: CRANIOTOMY HEMATOMA EVACUATION SUBDURAL;  Surgeon: Jadene Pierini, MD;  Location: [facility] OR;  Service: Neurosurgery;  Laterality: Right;   CRANIOTOMY Right 07/02/2019   Procedure: CRANIOTOMY TUMOR EXCISION with Normajean Glasgow;  Surgeon: Jadene Pierini, MD;  Location: [facility] OR;  Service: Neurosurgery;  Laterality: Right;  posterior   Patient Active Problem List   Diagnosis Date Noted   Nontraumatic subdural  hemorrhage, unspecified (HCC) 11/07/2022   Acute respiratory failure with hypoxia (HCC) 11/01/2022   AKI (acute kidney injury) (HCC) 11/01/2022   Headache 11/30/2021   Status epilepticus (HCC) 05/22/2021   Alcohol abuse 01/11/2020   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HCC) 01/07/2020   Intentional overdose of drug in tablet form (HCC) 01/05/2020   Drug overdose, multiple drugs, intentional self-harm, initial encounter (HCC) 01/04/2020   Schizoaffective disorder, depressive type (HCC) 01/01/2020   Generalized anxiety disorder 01/01/2020   Severe episode of recurrent major depressive disorder, with psychotic features (HCC) 01/01/2020   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)    Focal seizures (HCC) 11/07/2019   Auditory hallucinations 07/30/2019   Goals of care, counseling/discussion 07/19/2019   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HCC) 07/01/2019   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/01/2019   Brain mass 06/30/2019   Attention deficit hyperactivity disorder (ADHD), combined type 03/23/2019   Mild episode of recurrent major depressive disorder (HCC) 03/23/2019   Tobacco abuse 10/04/2012   Allergic rhinitis 08/18/2012    ONSET DATE: 11/11/2022  REFERRING DIAG: I62.00 (ICD-10-CM) - Nontraumatic subdural hemorrhage, unspecified  THERAPY DIAG:  Muscle weakness (generalized)  Unsteadiness on feet  Other abnormalities of gait and mobility  Rationale for Evaluation and Treatment: Rehabilitation  SUBJECTIVE:  SUBJECTIVE STATEMENT: Ready to graduate today. Wants to work on stairs and balance. Has been doing his exercises at home.   Pt accompanied by: self  PERTINENT HISTORY: Pt presented on 10/31/22 with seizure >30 mins. EEG consistent with epilepsy and  cortical dysfunction arising from R hemisphere, also moderate diffuse encephalopathy (same as EEG on admission a year ago). MRI showed trace SDH along the R parietal convexity. 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 No   PRECAUTIONS: Fall and Other: hx of seizures     WEIGHT BEARING RESTRICTIONS: No  FALLS: Has patient fallen in last 6 months? Yes. Number of falls 3 or 4, did not hurt himself   LIVING ENVIRONMENT: Lives with:  lives with mom  Lives in: House/apartment Stairs: No Has following equipment at home: None  PLOF: Independent with community mobility without device and has not driven since before his brain tumor surgery (2021)  PATIENT GOALS: Wants to have good balance again, wants to get his thighs good   OBJECTIVE:   DIAGNOSTIC FINDINGS: MRI brain 11/01/22 IMPRESSION: 1. Unchanged 6 mm focus of acute hemorrhage along the anterior margin of the right occipital resection cavity. No new or nodular enhancement to suggest local recurrence. 2. Trace subdural hemorrhage along the right parietal convexity.  COGNITION: Overall cognitive status: Within functional limits for tasks assessed and History of cognitive impairments - at baseline   SENSATION: Light touch: WFL and bilat LE  Sometimes will get numbness in L arm   COORDINATION: Nose to finger: Dysmetric LLE Heel to shin: WNL    POSTURE: No Significant postural limitations   LOWER EXTREMITY MMT:    MMT Right Eval Left Eval  Hip flexion 5 5  Hip extension    Hip abduction 4+ 4+  Hip adduction 4 4  Hip internal rotation    Hip external rotation    Knee flexion 5 4+  Knee extension 4+ 4+  Ankle dorsiflexion 5 5  Ankle plantarflexion    Ankle  inversion    Ankle eversion    (Blank rows = not tested)  All tested in sitting Noted incr tremors with hip ADD/ABD    TRANSFERS: Assistive device utilized: None  Sit to stand: Complete Independence Stand to sit: Complete Independence Noted pt having more weight through R side and RLE staggered posteriorly    STAIRS: Level of Assistance: SBA Stair Negotiation Technique: Alternating Pattern  Forwards with No Rails Single Rail on Right Number of Stairs: 4 x 2 reps = 8 total   Height of Stairs: 6  Comments: Pt slower to descend due to weakness, balance, and visual deficits when not using handrail   GAIT: Gait pattern: decreased arm swing- Left, decreased stride length, lateral hip instability, and decreased trunk rotation Distance walked: Clinic distances  Assistive device utilized: None Level of assistance: Modified independence Comments: No overt unsteadiness noted   FUNCTIONAL TESTS:  5 times sit to stand: 10.8 seconds with no UE support  10 meter walk test: 10.2 seconds = 3.22 ft/sec  SLS: RLE and LLE: 1-2 seconds       M-CTSIB  Condition 1: Firm Surface, EO 30 Sec, Normal Sway  Condition 2: Firm Surface, EC 30 Sec, Normal Sway  Condition 3: Foam Surface, EO 30 Sec, Mild Sway  Condition 4: Foam Surface,  EC 13 Sec       TODAY'S TREATMENT:                                                                                                                                STAIRS:  Level of Assistance: Modified independence  Stair Negotiation Technique: Alternating Pattern  with No Rails  Number of Stairs: 3 sets of 4 stairs = 12 total   Height of Stairs: 6" step   Comments: Pt able to perform with no rails, improved eccentric control when lowering   At bottom of staircase on thick blue foam Alternating step ups with contralateral march for dynamic SLS, x15 reps each leg, initial cues for sequencing, pt needing intermittent UE support for balance  On blue foam beam:   Tandem gait with heel tap before down and back x3 reps Afterwards, pt performed tandem gait on level ground in // bars x3 reps with improved balance On black side of BOSU: 2 x 10 reps mini squats, pt needing fingertip support for balance, min guard, pt with incr shaking in legs when coming up to stand With 6 blaze pods (3 on first step, 3 on 2nd step), for SLS, weight shifting, visual scanning, reaction times, performed on random hit setting with initial UE support for balance and CGA 4 bouts of 1 minute: 19 hits, 18 hits, 30 hits, 30 hits  PATIENT EDUCATION:  Education details: D/C from PT, Continue HEP  Person educated: Patient Education method: Programmer, multimedia, Demonstration, Tactile cues, Verbal cues, and Handouts Education comprehension: verbalized understanding, returned demonstration, verbal cues required, tactile cues required, and needs further education    HOME EXERCISE PROGRAM:  Access Code: [email or web address] URL: [email or web address] Date: 12/26/2022 Prepared by: Sherlie Ban  Exercises - Side Stepping with Resistance at Ankles  - 1 x daily - 7 x weekly - 1 sets - 3 reps - Reverse Band Walks  - 1 x daily - 7 x weekly - 1 sets - 3 reps - Band Walks  - 1 x daily - 7 x weekly - 1 sets - 3 reps - Standing Terminal Knee Extension with Resistance  - 1 x daily - 7 x weekly - 3 sets - 10 reps - Standing Hip Flexor Stretch  - 1 x daily - 7 x weekly - 1 sets - 10 reps - Romberg Stance Eyes Closed on Foam Pad  - 1 x daily - 7 x weekly - 1 sets - 3 reps - 30 hold - Full Plank  - 1 x daily - 7 x weekly - 3 reps - 30 hold - Bird Dog  - 1 x daily - 7 x weekly - 1 sets - 10 reps - Clamshell  - 1 x daily - 7 x weekly - 2 sets - 10 reps - Staggered Sit-to-Stand  - 1 x daily - 7 x weekly - 2 sets - 10 reps - Forward Step Touch  - 1 x  daily - 7 x weekly - 3 sets - 10 reps - Bird Dog (Mirrored)  - 1 x daily - 7 x weekly - 1 sets - 10 reps - Single Leg Heel Raise with Counter Support   - 1 x daily - 5 x weekly - 2 sets - 10 reps, pt needing UE support for balance - Standing March  - 1 x daily - 5 x weekly - 1-2 sets - 10 reps - on air ex/compliant surface, cues for slowed pace  PHYSICAL THERAPY DISCHARGE SUMMARY  Visits from Start of Care: 7  Current functional level related to goals / functional outcomes: See LTGs/Clinical Assessment Statement   Remaining deficits: Impaired coordination, cognitive deficits, impaired high level balance    Education / Equipment: HEP   Patient agrees to discharge. Patient goals were partially met. Patient is being discharged due to being pleased with the current functional level.   GOALS: Goals reviewed with patient? Yes  SHORT TERM GOALS: ALL STGS = LTGS  LONG TERM GOALS: Target date: 12/14/2022 Updated goal date: 01/02/23  Pt will be independent with final HEP for strength/balance/gait in order to build upon functional gains made in therapy. Baseline:  Goal status: IN PROGRESS  2.  Pt will improve FGA to at least a 29/30 in order to demo decr fall risk. Baseline: 26/30  27/30 (10/14) Goal status: IN PROGRESS  3.  Pt will perform 8 steps with no handrail and reciprocal pattern with mod I in order to demo improved balance.  Baseline:  Goal status: MET  4.  Pt will improve condition 4 of mCTSIB to at least 25 seconds to demo improved vestibular input for balance.  Baseline: 13 seconds   20.3 seconds (10/14) Goal status: IN PROGRESS  5.  Pt will improve gait speed with no to at least 3.5 ft/sec in order to demo improved community mobility.  Baseline: 3.22 ft/sec  9.72 seconds = 3.37 ft/sec Goal status: IN PROGRESS   ASSESSMENT:  CLINICAL IMPRESSION: Pt late to session, so session was limited. Pt requests to be discharged after today's session as pt feels good about his progress with PT and has his exercises to do at home. Pt requests to work on balance and stair training today. Pt able to perform stairs with no  UE support, indicating improved eccentric control.   OBJECTIVE IMPAIRMENTS: Abnormal gait, decreased activity tolerance, decreased balance, decreased cognition, decreased coordination, decreased strength, and impaired sensation.   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   REHAB POTENTIAL: Good  CLINICAL DECISION MAKING: Evolving/moderate complexity  EVALUATION COMPLEXITY: Moderate  PLAN:  PT FREQUENCY: 1-2x/week  PT DURATION: 8 weeks - due to delay in scheduling   PLANNED INTERVENTIONS: Therapeutic exercises, Therapeutic activity, Neuromuscular re-education, Balance training, Gait training, Patient/Family education, Self Care, Joint mobilization, Stair training, Vestibular training, Manual therapy, and Re-evaluation  PLAN FOR NEXT SESSION: D/C   Sherlie Ban, PT, DPT 01/02/23 2:02 PM

## 2023-01-03 ENCOUNTER — Emergency Department (HOSPITAL_COMMUNITY)
Admission: EM | Admit: 2023-01-03 | Discharge: 2023-01-03 | Disposition: A | Discharge status: Home / Self Care | Payer: Medicare Other | Attending: Emergency Medicine | Admitting: Emergency Medicine

## 2023-01-03 ENCOUNTER — Other Ambulatory Visit: Payer: Self-pay

## 2023-01-03 DIAGNOSIS — H6002 Abscess of left external ear: Secondary | ICD-10-CM | POA: Insufficient documentation

## 2023-01-03 DIAGNOSIS — L0291 Cutaneous abscess, unspecified: Secondary | ICD-10-CM

## 2023-01-03 MED ORDER — AMOXICILLIN-POT CLAVULANATE 875-125 MG PO TABS
1.0000 | ORAL_TABLET | Freq: Two times a day (BID) | ORAL | 0 refills | Status: DC
Start: 2023-01-03 — End: 2023-06-05

## 2023-01-03 MED ORDER — LIDOCAINE HCL (PF) 1 % IJ SOLN
5.0000 mL | Freq: Once | INTRAMUSCULAR | Status: AC
  Administered 2023-01-03: 5 mL
  Filled 2023-01-03: qty 5

## 2023-01-03 NOTE — Discharge Instructions (Addendum)
Take antibiotics as prescribed and complete the full course. You can apply warm compresses to the area for 20 minutes at a time. Follow-up with either ENT or general surgery for recheck.  Return to the ER for worsening or concerning symptoms.

## 2023-01-03 NOTE — ED Triage Notes (Signed)
Pt c/o abscess on R ear, stated a month ago then resolved. States within the past week, it has worsened again. Pt states he tried warm compresses without relief. Denies fevers. Pt reports he was here yesterday but LWR prior to being seen.

## 2023-01-03 NOTE — ED Provider Notes (Signed)
[place] [facility] AT [facility] Provider Note   CSN: [id number] Arrival date & time: 01/03/23  1058     History  Chief Complaint  Patient presents with   Abscess    Stephen Beard is a 27 y.o. male.  27 year old male presents with complaint of abscess behind the left ear.  States that he had a small knot that he noticed before however it had resolved and then he bumped the area again and it swelled up and is very painful.  No active drainage.       Home Medications Prior to Admission medications   Medication Sig Start Date End Date Taking? Authorizing Provider  amoxicillin-clavulanate (AUGMENTIN) 875-125 MG tablet Take 1 tablet by mouth every 12 (twelve) hours. 01/03/23  Yes Jeannie Fend, PA-C  acetaminophen (TYLENOL) 325 MG tablet Take 1-2 tablets (325-650 mg total) by mouth every 4 (four) hours as needed for mild pain. 11/07/22   Love, Evlyn Kanner, PA-C  benzocaine (ORAJEL) 10 % mucosal gel Use as directed in the mouth or throat 4 (four) times daily as needed for mouth pain. Patient not taking: Reported on 11/16/2022 11/07/22   Love, Evlyn Kanner, PA-C  diazePAM, 15 MG Dose, (VALTOCO 15 MG DOSE) 2 x 7.5 MG/0.1ML LQPK Place 1 puff into the nose as needed (for seizure lasting longer than 2 minutes). 11/11/22   Love, Evlyn Kanner, PA-C  lamoTRIgine (LAMICTAL) 100 MG tablet Take 1 tablet by mouth twice daily.  Take with 150mg  tablet (250mg  twice daily total) 11/28/22   Vaslow, Georgeanna Lea, MD  lamoTRIgine (LAMICTAL) 150 MG tablet TAKE 1 TABLET BY MOUTH TWICE DAILY.TAKE WITH 100 MG TABLET.(250 MG TWICE DAILY TOTAL). Strength: 150 mg 11/28/22   Henreitta Leber, MD  loratadine (CLARITIN) 10 MG tablet Take 1 tablet (10 mg total) by mouth daily. 11/12/22   Love, Evlyn Kanner, PA-C  melatonin 3 MG TABS tablet Take 1 tablet (3 mg total) by mouth at bedtime as needed. 11/11/22   Love, Evlyn Kanner, PA-C      Allergies    Prozac [fluoxetine]    Review of Systems   Review of  Systems Negative except as per HPI Physical Exam Updated Vital Signs BP 115/80   Pulse 77   Temp 97.9 F (36.6 C) (Oral)   Resp 16   Ht 5\' 7"  (1.702 m)   Wt 63.5 kg   SpO2 99%   BMI 21.93 kg/m  Physical Exam Vitals and nursing note reviewed.  Constitutional:      General: He is not in acute distress.    Appearance: He is well-developed. He is not diaphoretic.  HENT:     Head: Normocephalic and atraumatic.  Pulmonary:     Effort: Pulmonary effort is normal.  Skin:    General: Skin is warm and dry.     Findings: Erythema present.     Comments: Area of erythema with swelling and fluctuance posterior to the left earlobe which is tender to the touch, no active drainage.  Neurological:     Mental Status: He is alert and oriented to person, place, and time.  Psychiatric:        Behavior: Behavior normal.     ED Results / Procedures / Treatments   Labs (all labs ordered are listed, but only abnormal results are displayed) Labs Reviewed - No data to display  EKG None  Radiology No results found.  Procedures .Marland KitchenIncision and Drainage  Date/Time: 01/03/2023 1:29 PM  Performed  by: Jeannie Fend, PA-C Authorized by: Jeannie Fend, PA-C   Consent:    Consent obtained:  Verbal   Consent given by:  Patient   Risks discussed:  Bleeding, incomplete drainage, pain and damage to other organs   Alternatives discussed:  No treatment Universal protocol:    Procedure explained and questions answered to patient or proxy's satisfaction: yes     Relevant documents present and verified: yes     Test results available : yes     Imaging studies available: yes     Required blood products, implants, devices, and special equipment available: yes     Site/side marked: yes     Immediately prior to procedure, a time out was called: yes     Patient identity confirmed:  Verbally with patient Location:    Type:  Abscess   Size:  1 cm x 1 cm   Location:  Head   Head location:  L  external ear Pre-procedure details:    Skin preparation:  Betadine Sedation:    Sedation type:  None Anesthesia:    Anesthesia method:  Local infiltration   Local anesthetic:  Lidocaine 1% w/o epi Procedure type:    Complexity:  Simple Procedure details:    Incision types:  Single straight   Incision depth:  Subcutaneous   Drainage:  Purulent   Drainage amount:  Moderate   Packing materials:  None Post-procedure details:    Procedure completion:  Tolerated well, no immediate complications     Medications Ordered in ED Medications  lidocaine (PF) (XYLOCAINE) 1 % injection 5 mL (5 mLs Infiltration Given by Other 01/03/23 1317)    ED Course/ Medical Decision Making/ A&P                                 Medical Decision Making  27 year old male with abscess posterior to left ear.  Treated with I&D.  No packing placed.  Started on antibiotics.  Recommend recheck in 2 days with PCP.  Provided with referral to ENT and general surgery for recheck with return to ER precautions.        Final Clinical Impression(s) / ED Diagnoses Final diagnoses:  Abscess    Rx / DC Orders ED Discharge Orders          Ordered    amoxicillin-clavulanate (AUGMENTIN) 875-125 MG tablet  Every 12 hours        01/03/23 1326              Jeannie Fend, PA-C 01/03/23 1330    Lonell Grandchild, MD 01/04/23 [phone]

## 2023-04-07 ENCOUNTER — Other Ambulatory Visit: Payer: Self-pay | Admitting: Internal Medicine

## 2023-04-07 DIAGNOSIS — R569 Unspecified convulsions: Secondary | ICD-10-CM

## 2023-04-20 ENCOUNTER — Encounter: Payer: Self-pay | Admitting: Physical Therapy

## 2023-04-26 ENCOUNTER — Encounter: Payer: Self-pay | Admitting: Internal Medicine

## 2023-04-27 ENCOUNTER — Other Ambulatory Visit: Payer: Self-pay

## 2023-05-02 ENCOUNTER — Telehealth: Payer: Self-pay | Admitting: *Deleted

## 2023-05-02 NOTE — Telephone Encounter (Signed)
 Scheduling message sent for F/U appointment after MRI brain on 05/23/23.

## 2023-05-05 ENCOUNTER — Telehealth: Payer: Self-pay | Admitting: Internal Medicine

## 2023-05-05 NOTE — Telephone Encounter (Signed)
 Patient is aware of scheduled appointment times/dates

## 2023-05-06 ENCOUNTER — Other Ambulatory Visit: Payer: Self-pay

## 2023-05-25 ENCOUNTER — Ambulatory Visit (HOSPITAL_COMMUNITY): Admission: RE | Admit: 2023-05-25 | Payer: 59 | Source: Ambulatory Visit

## 2023-05-26 ENCOUNTER — Ambulatory Visit (HOSPITAL_COMMUNITY)
Admission: RE | Admit: 2023-05-26 | Discharge: 2023-05-26 | Disposition: A | Discharge status: Home / Self Care | Source: Ambulatory Visit | Attending: Internal Medicine | Admitting: Internal Medicine

## 2023-05-26 DIAGNOSIS — C714 Malignant neoplasm of occipital lobe: Secondary | ICD-10-CM | POA: Diagnosis present

## 2023-05-26 MED ORDER — GADOBUTROL 1 MMOL/ML IV SOLN
6.0000 mL | Freq: Once | INTRAVENOUS | Status: AC | PRN
  Administered 2023-05-26: 6 mL via INTRAVENOUS

## 2023-06-05 ENCOUNTER — Telehealth: Payer: Self-pay | Admitting: Internal Medicine

## 2023-06-05 ENCOUNTER — Inpatient Hospital Stay: Payer: 59 | Attending: Internal Medicine | Admitting: Internal Medicine

## 2023-06-05 VITALS — BP 129/84 | HR 71 | Temp 98.5°F | Resp 15 | Wt 140.9 lb

## 2023-06-05 DIAGNOSIS — R569 Unspecified convulsions: Secondary | ICD-10-CM

## 2023-06-05 DIAGNOSIS — C714 Malignant neoplasm of occipital lobe: Secondary | ICD-10-CM | POA: Insufficient documentation

## 2023-06-05 DIAGNOSIS — F17210 Nicotine dependence, cigarettes, uncomplicated: Secondary | ICD-10-CM | POA: Insufficient documentation

## 2023-06-05 DIAGNOSIS — Z923 Personal history of irradiation: Secondary | ICD-10-CM | POA: Diagnosis not present

## 2023-06-05 DIAGNOSIS — Z9221 Personal history of antineoplastic chemotherapy: Secondary | ICD-10-CM | POA: Diagnosis not present

## 2023-06-05 NOTE — Progress Notes (Signed)
 [facility] at [facility] [address]  [place], [place] [address] [phone]   Interval Evaluation  Date of Service: 06/05/23 Patient Name: Stephen Beard Patient MRN: [id number] Patient DOB: 07/12/1995 Provider: Henreitta Leber, MD  Identifying Statement:  Stephen Beard is a 28 y.o. male with right parietal  anaplastic oligodendoglioma    Oncologic History: Oncology History  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HCC)  07/02/2019 Surgery   Craniotomy, resection by Dr. Maurice Small. Followed by hematoma evacuation on 07/03/19.   08/05/2019 - 09/17/2019 Radiation Therapy   IMRT with concurrent Temodar 75mg /m2 daily   10/15/2019 - 11/26/2020 Chemotherapy   Completes 6 cycles of adjuvant 5-day Temodar, with embedded treatment delays     Biomarkers:  MGMT Unknown.  IDH 1/2 Mutated.  EGFR Unknown  1p/19q co-deleted   Interval History:  Stephen Beard presents today for follow up after recent MRI brain study.  He denies any seizures since prior visit.  Remains on Lamictal 250mg  BID with nayzilam  rescue.  Otherwise denies new or progressive deficits.  Occassional tension headaches only.    H+P (07/19/19) Patient presented to medical attention with several months of progressive headaches and left sided visual impairment.  This was accompanied by episodes of deja-vu +paroxysmal burning smells which would occur almost daily over that time.  CNS imaging demonstrated large parieto-occipital mass with herniation syndrome.  Mass was resected/debulked, and post-operative course was complicated by intracranial hematoma and also respiratory failure.  At present, he is walking and talking, carries left sided visual impairment.  Currently on antibiotics for wound infection per Dr. Maurice Small.  Not requiring steroids.  Currently on lamictal 50mg  daily.  Medications: Current Outpatient Medications on File Prior to Visit  Medication Sig Dispense Refill    acetaminophen (TYLENOL) 325 MG tablet Take 1-2 tablets (325-650 mg total) by mouth every 4 (four) hours as needed for mild pain. (Patient not taking: Reported on 06/05/2023)     diazePAM, 15 MG Dose, (VALTOCO 15 MG DOSE) 2 x 7.5 MG/0.1ML LQPK Place 1 puff into the nose as needed (for seizure lasting longer than 2 minutes). (Patient not taking: Reported on 06/05/2023) 2 each 1   lamoTRIgine (LAMICTAL) 100 MG tablet TAKE 1 TABLET BY MOUTH TWICE DAILY. TAKE WITH 150MG  TABLET (250MG  TWICE DAILY TOTAL) 180 tablet 2   lamoTRIgine (LAMICTAL) 150 MG tablet TAKE 1 TABLET BY MOUTH TWICE DAILY.TAKE WITH 100 MG TABLET.(250 MG TWICE DAILY TOTAL) 180 tablet 2   No current facility-administered medications on file prior to visit.    Allergies:  Allergies  Allergen Reactions   Prozac [Fluoxetine]     Intolerable nausea/2015 office notes   Past Medical History:  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HCC)    GAD (generalized anxiety disorder)    Headache    Intentional overdose (HCC) 12/2019   Psychotic disorder (HCC)    Schizoaffective disorder (HCC)    Seizures (HCC)    Substance abuse (HCC)    Suicidal ideation    Past Surgical History:  Past Surgical History:  Procedure Laterality Date   APPLICATION OF CRANIAL NAVIGATION N/A 07/02/2019   Procedure: APPLICATION OF CRANIAL NAVIGATION;  Surgeon: Jadene Pierini, MD;  Location: [facility] OR;  Service: Neurosurgery;  Laterality: N/A;   CRANIOTOMY N/A 07/03/2019   Procedure: CRANIOTOMY HEMATOMA EVACUATION SUBDURAL;  Surgeon: Jadene Pierini, MD;  Location: [facility] OR;  Service:  Neurosurgery;  Laterality: N/A;   CRANIOTOMY Right 07/03/2019   Procedure: CRANIOTOMY HEMATOMA EVACUATION SUBDURAL;  Surgeon: Jadene Pierini, MD;  Location: [facility] OR;  Service: Neurosurgery;  Laterality: Right;   CRANIOTOMY Right 07/02/2019   Procedure: CRANIOTOMY TUMOR EXCISION with Normajean Glasgow;  Surgeon:  Jadene Pierini, MD;  Location: [facility] OR;  Service: Neurosurgery;  Laterality: Right;  posterior   Social History:  Social History   Socioeconomic History   Marital status: Single    Spouse name: Not on file   Number of children: Not on file   Years of education: Not on file   Highest education level: Not on file  Occupational History   Not on file  Tobacco Use   Smoking status: Every Day    Current packs/day: 1.00    Average packs/day: 1 pack/day for 12.0 years (12.0 ttl pk-yrs)    Types: Cigarettes   Smokeless tobacco: Current    Types: Chew  Vaping Use   Vaping status: Never Used  Substance and Sexual Activity   Alcohol use: Yes    Alcohol/week: 20.0 standard drinks of alcohol    Types: 20 Cans of beer per week   Drug use: Not Currently    Types: Cocaine    Comment: last use "couple of weeks ago" when he had "2 lines,"  frequency "once in a blue moon"   Sexual activity: Not Currently  Other Topics Concern   Not on file  Social History Narrative   Not on file   Social Drivers of Health   Financial Resource Strain: Medium Risk (01/07/2022)   Overall Financial Resource Strain (CARDIA)    Difficulty of Paying Living Expenses: Somewhat hard  Food Insecurity: No Food Insecurity (11/05/2022)   Hunger Vital Sign    Worried About Running Out of Food in the Last Year: Never true    Ran Out of Food in the Last Year: Never true  Transportation Needs: No Transportation Needs (11/05/2022)   PRAPARE - Administrator, Civil Service (Medical): No    Lack of Transportation (Non-Medical): No  Physical Activity: Not on file  Stress: Not on file  Social Connections: Moderately Integrated (01/07/2022)   Social Connection and Isolation Panel [NHANES]    Frequency of Communication with Friends and Family: More than three times a week    Frequency of Social Gatherings with Friends and Family: More than three times a week    Attends Religious Services: More than 4 times per  year    Active Member of [employer] or Organizations: Yes    Attends Banker Meetings: More than 4 times per year    Marital Status: Never married  Intimate Partner Violence: Not At Risk (11/05/2022)   Humiliation, Afraid, Rape, and Kick questionnaire    Fear of Current or Ex-Partner: No    Emotionally Abused: No    Physically Abused: No    Sexually Abused: No   Family History: No family history on file.  Review of Systems: Constitutional: Doesn't report fevers, chills or abnormal weight loss Eyes: Doesn't report blurriness of vision Ears, nose, mouth, throat, and face: Doesn't report sore throat Respiratory: Doesn't report cough, dyspnea or wheezes Cardiovascular: Doesn't report palpitation, chest discomfort  Gastrointestinal:  Doesn't report nausea, constipation, diarrhea GU: Doesn't report incontinence Skin: Doesn't report skin rashes Neurological: Per HPI Musculoskeletal: Doesn't report joint pain Behavioral/Psych: Doesn't report anxiety  Physical Exam: Vitals:   06/05/23 1035  BP: 129/84  Pulse: 71  Resp: 15  Temp: 98.5 F (36.9 C)  SpO2: 98%    KPS: 80. General: Alert, cooperative, pleasant, in no acute distress Head: Normal EENT: No conjunctival injection or scleral icterus.  Lungs: Resp effort normal Cardiac: Regular rate Abdomen: Non-distended abdomen Skin: No rashes cyanosis or petechiae. Extremities: No clubbing or edema  Neurologic Exam: Mental Status: Awake, alert, attentive to examiner. Oriented to self and environment. Language is fluent with intact comprehension.  Cranial Nerves: Visual acuity is grossly normal. Left homonymous hemianopia. Extra-ocular movements intact. No ptosis. Face is symmetric Motor: Tone and bulk are normal. Power is full in both arms and legs. Reflexes are symmetric, no pathologic reflexes present.  Sensory: Intact to light touch Gait: Sensory dystaxia   Labs: I have reviewed the data as listed    Component Value  Date/Time   NA 137 11/10/2022 0610   K 4.4 11/10/2022 0610   CL 100 11/10/2022 0610   CO2 28 11/10/2022 0610   GLUCOSE 104 (H) 11/10/2022 0610   BUN 14 11/10/2022 0610   CREATININE 0.81 11/10/2022 0610   CREATININE 0.95 05/17/2021 1021   CALCIUM 9.6 11/10/2022 0610   PROT 7.2 11/08/2022 0803   ALBUMIN 3.9 11/08/2022 0803   AST 72 (H) 11/08/2022 0803   AST 37 05/17/2021 1021   ALT 102 (H) 11/08/2022 0803   ALT 48 (H) 05/17/2021 1021   ALKPHOS 66 11/08/2022 0803   BILITOT 0.7 11/08/2022 0803   BILITOT 1.1 05/17/2021 1021   GFRNONAA >60 11/10/2022 0610   GFRNONAA >60 05/17/2021 1021   GFRAA >60 12/05/2019 1206   Lab Results  Component Value Date   WBC 6.0 11/10/2022   NEUTROABS 2.8 11/08/2022   HGB 15.4 11/10/2022   HCT 44.6 11/10/2022   MCV 96.3 11/10/2022   PLT 346 11/10/2022   Imaging:  [facility] Clinician Interpretation: I have personally reviewed the CNS images as listed.  My interpretation, in the context of the patient's clinical presentation, is stable disease  MR BRAIN W WO CONTRAST Result Date: 05/26/2023 CLINICAL DATA:  28 year old male with a history of right occipital anaplastic oligodendroglioma status post resection in 2021. Subsequent radiation. Small acute hemorrhage in the right occipital resection cavity last year. Restaging. EXAM: MRI HEAD WITHOUT AND WITH CONTRAST TECHNIQUE: Multiplanar, multiecho pulse sequences of the brain and surrounding structures were obtained without and with intravenous contrast. CONTRAST:  6mL GADAVIST GADOBUTROL 1 MMOL/ML IV SOLN COMPARISON:  Brain MRI 11/01/2022 and earlier. FINDINGS: Brain: Right occipital lobe, inferior parietal lobe resection and encephalomalacia changes, stable and size and configuration since 04/19/2022 and appear partially communicating with the right occipital and temporal horns. Regional hemosiderin there does not appear significantly changed. Regional T2 and FLAIR hyperintensity, tracking toward the posterior  right frontal lobe is stable since 04/19/2022. Following contrast patchy peripheral pachymeningeal thickening and enhancement in the right hemisphere remain stable (series 16, image 105). No increased or suspicious enhancement in the post treatment area. No suspicious DWI changes. No superimposed restricted diffusion to suggest acute infarction. No midline shift, acute intracranial hemorrhage. Cervicomedullary junction and pituitary are within normal limits. No new signal abnormality. No new enhancement. Vascular: Major intracranial vascular flow voids are stable. Following contrast the major dural venous sinuses are enhancing and appear to be patent. Skull and upper cervical spine: Stable. Previous craniotomy with normal background bone marrow signal. Negative visible cervical spine and spinal cord. Sinuses/Orbits: Stable and negative. Other: Mastoids remain clear. Visible internal auditory structures appear normal. Stable scalp soft tissues. IMPRESSION: Stable since 04/19/2022  and satisfactory Post treatment appearance of the Brain. No new intracranial abnormality. Electronically Signed   By: Odessa Fleming M.D.   On: 05/26/2023 16:22    Assessment/Plan 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HCC) [C71.4]  Stephen Beard is clinically stable today.  MRI brain demonstrates no new or concerning findings.  We again recommended continued imaging surveillance only at this time, he is agreeable with this plan.   For seizures, we recommended continuing Lamictal 250/250.   We ask that Stephen Beard return to clinic in 6 months following next brain MRI, or sooner as needed.    All questions were answered. The patient knows to call the clinic with any problems, questions or concerns. No barriers to learning were detected.  I have spent a total of 40 minutes of face-to-face and non-face-to-face time, excluding clinical staff time, preparing to see patient, ordering tests and/or medications, counseling the  patient, and independently interpreting results and communicating results to the patient/family/caregiver    Henreitta Leber, MD Medical Director of Neuro-Oncology [facility] at [place] [facility] 06/05/23 10:45 AM

## 2023-06-05 NOTE — Telephone Encounter (Signed)
 Completed - Patient scheduled appts and is aware of all details.

## 2023-06-06 ENCOUNTER — Other Ambulatory Visit: Payer: Self-pay

## 2023-09-25 ENCOUNTER — Telehealth: Payer: Self-pay | Admitting: *Deleted

## 2023-09-25 ENCOUNTER — Other Ambulatory Visit: Payer: Self-pay | Admitting: *Deleted

## 2023-09-25 ENCOUNTER — Other Ambulatory Visit: Payer: Self-pay | Admitting: Internal Medicine

## 2023-09-25 MED ORDER — VALTOCO 10 MG DOSE 10 MG/0.1ML NA LIQD: 10.0000 mg | Freq: Three times a day (TID) | NASAL | 0 refills | Status: DC | PRN

## 2023-09-25 NOTE — Telephone Encounter (Signed)
 PC received from patient, he reports a seizure yesterday.  He had eaten lunch with family & said he could feel this coming on.  He started spitting up some foam & had sensation of electricity going down his L arm, said this lasted a few minutes.  His family called EMS but he decided not to go to the hospital. He has had a headache ever since the seizure, this is some better today.  He is still taking lamictal  250 mg twice a day. He also says he needs more nayzilam .  Per Dr Buckley, he is not changing any meds for the patient, will send in rx for nayzilam .  PC to patient, informed him no medication changes at this time, he verbalizes understanding.

## 2023-09-26 ENCOUNTER — Telehealth: Payer: Self-pay | Admitting: *Deleted

## 2023-09-26 ENCOUNTER — Other Ambulatory Visit: Payer: Self-pay | Admitting: Internal Medicine

## 2023-09-26 MED ORDER — NAYZILAM 5 MG/0.1ML NA SOLN: 5.0000 mg | Freq: Four times a day (QID) | NASAL | 0 refills | Status: DC | PRN

## 2023-09-26 NOTE — Telephone Encounter (Signed)
 Medication Prior Authorization Status  Processed CoverMyMeds KEY: [id number]  Pending Today  Per [employer] Medicare Part D   PA Case ID: [id number]   Effective Pending through Pending.

## 2023-09-28 ENCOUNTER — Encounter: Payer: Self-pay | Admitting: Internal Medicine

## 2023-11-27 ENCOUNTER — Telehealth: Payer: Self-pay | Admitting: Internal Medicine

## 2023-11-27 NOTE — Telephone Encounter (Signed)
 Rescheduled appointments per patients request via incoming call. Talked with the patient and he is aware of the changes made to his upcoming appointment.

## 2023-11-30 ENCOUNTER — Ambulatory Visit (HOSPITAL_COMMUNITY)
Admission: RE | Admit: 2023-11-30 | Discharge: 2023-11-30 | Disposition: A | Discharge status: Home / Self Care | Source: Ambulatory Visit | Attending: Internal Medicine | Admitting: Internal Medicine

## 2023-11-30 DIAGNOSIS — C714 Malignant neoplasm of occipital lobe: Secondary | ICD-10-CM | POA: Insufficient documentation

## 2023-11-30 MED ORDER — GADOBUTROL 1 MMOL/ML IV SOLN
6.0000 mL | Freq: Once | INTRAVENOUS | Status: AC | PRN
  Administered 2023-11-30: 6 mL via INTRAVENOUS

## 2023-12-04 ENCOUNTER — Ambulatory Visit: Admitting: Internal Medicine

## 2023-12-04 ENCOUNTER — Inpatient Hospital Stay: Attending: Internal Medicine | Admitting: Internal Medicine

## 2023-12-04 ENCOUNTER — Telehealth: Payer: Self-pay | Admitting: Internal Medicine

## 2023-12-04 VITALS — BP 120/79 | HR 64 | Temp 97.7°F | Resp 20 | Wt 140.6 lb

## 2023-12-04 DIAGNOSIS — Z9221 Personal history of antineoplastic chemotherapy: Secondary | ICD-10-CM | POA: Diagnosis not present

## 2023-12-04 DIAGNOSIS — Z79899 Other long term (current) drug therapy: Secondary | ICD-10-CM | POA: Insufficient documentation

## 2023-12-04 DIAGNOSIS — Z923 Personal history of irradiation: Secondary | ICD-10-CM | POA: Insufficient documentation

## 2023-12-04 DIAGNOSIS — Z85841 Personal history of malignant neoplasm of brain: Secondary | ICD-10-CM | POA: Diagnosis present

## 2023-12-04 DIAGNOSIS — C714 Malignant neoplasm of occipital lobe: Secondary | ICD-10-CM | POA: Diagnosis not present

## 2023-12-04 DIAGNOSIS — F17210 Nicotine dependence, cigarettes, uncomplicated: Secondary | ICD-10-CM | POA: Diagnosis not present

## 2023-12-04 MED ORDER — NAYZILAM 5 MG/0.1ML NA SOLN: 5.0000 mg | Freq: Four times a day (QID) | NASAL | 0 refills | Status: AC | PRN

## 2023-12-04 NOTE — Progress Notes (Signed)
 [facility] [address]  [place], [place] [address] [phone]   Interval Evaluation  Date of Service: 12/04/23 Patient Name: Stephen Beard Patient MRN: [id number] Patient DOB: 1996-01-23 Provider: Arthea MARLA Manns, MD  Identifying Statement:  Stephen Beard is a 28 y.o. male with right parietal anaplastic oligodendoglioma   Oncologic History: Oncology History  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HCC)  07/02/2019 Surgery   Craniotomy, resection by Dr. Cheryle. Followed by hematoma evacuation on 07/03/19.   08/05/2019 - 09/17/2019 Radiation Therapy   IMRT with concurrent Temodar  75mg /m2 daily   10/15/2019 - 11/26/2020 Chemotherapy   Completes 6 cycles of adjuvant 5-day Temodar , with embedded treatment delays     Biomarkers:  MGMT Unknown.  IDH 1/2 Mutated.  EGFR Unknown  1p/19q co-deleted   Interval History:  Stephen Beard presents today for follow up after recent MRI brain study.  He did have one mild seizure earlier in the summer.  Remains on Lamictal  250mg  BID with nayzilam   rescue otherwise.  Otherwise denies new or progressive deficits.  Has upcoming cruise trip with his friends.    H+P (07/19/19) Patient presented to medical attention with several months of progressive headaches and left sided visual impairment.  This was accompanied by episodes of deja-vu +paroxysmal burning smells which would occur almost daily over that time.  CNS imaging demonstrated large parieto-occipital mass with herniation syndrome.  Mass was resected/debulked, and post-operative course was complicated by intracranial hematoma and also respiratory failure.  At present, he is walking and talking, carries left sided visual impairment.  Currently on antibiotics for wound infection per Dr. Cheryle.  Not requiring steroids.  Currently on lamictal  50mg  daily.  Medications: Current Outpatient Medications on File Prior to Visit  Medication Sig  Dispense Refill   acetaminophen  (TYLENOL ) 325 MG tablet Take 1-2 tablets (325-650 mg total) by mouth every 4 (four) hours as needed for mild pain. (Patient not taking: Reported on 06/05/2023)     lamoTRIgine  (LAMICTAL ) 100 MG tablet TAKE 1 TABLET BY MOUTH TWICE DAILY. TAKE WITH 150MG  TABLET (250MG  TWICE DAILY TOTAL) 180 tablet 2   lamoTRIgine  (LAMICTAL ) 150 MG tablet TAKE 1 TABLET BY MOUTH TWICE DAILY.TAKE WITH 100 MG TABLET.(250 MG TWICE DAILY TOTAL) 180 tablet 2   Midazolam  (NAYZILAM ) 5 MG/0.1ML SOLN Place 5 mg into the nose every 6 (six) hours as needed (seizure). 1 each 0   No current facility-administered medications on file prior to visit.    Allergies:  Allergies  Allergen Reactions   Prozac [Fluoxetine]     Intolerable nausea/2015 office notes   Past Medical History:  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HCC)    GAD (generalized anxiety disorder)    Headache    Intentional overdose (HCC) 12/2019   Psychotic disorder (HCC)    Schizoaffective disorder (HCC)    Seizures (HCC)    Substance abuse (HCC)    Suicidal ideation    Past Surgical History:  Past Surgical History:  Procedure Laterality Date   APPLICATION OF CRANIAL NAVIGATION N/A 07/02/2019   Procedure: APPLICATION OF CRANIAL NAVIGATION;  Surgeon: Cheryle Debby LABOR, MD;  Location: [facility] OR;  Service: Neurosurgery;  Laterality: N/A;   CRANIOTOMY N/A 07/03/2019   Procedure: CRANIOTOMY HEMATOMA EVACUATION SUBDURAL;  Surgeon: Cheryle Debby LABOR, MD;  Location: [facility] OR;  Service: Neurosurgery;  Laterality: N/A;   CRANIOTOMY Right 07/03/2019  Procedure: CRANIOTOMY HEMATOMA EVACUATION SUBDURAL;  Surgeon: Cheryle Debby LABOR, MD;  Location: [facility] OR;  Service: Neurosurgery;  Laterality: Right;   CRANIOTOMY Right 07/02/2019   Procedure: CRANIOTOMY TUMOR EXCISION with SONDRA;  Surgeon: Cheryle Debby LABOR, MD;  Location: [facility] OR;  Service: Neurosurgery;   Laterality: Right;  posterior   Social History:  Social History   Socioeconomic History   Marital status: Single    Spouse name: Not on file   Number of children: Not on file   Years of education: Not on file   Highest education level: Not on file  Occupational History   Not on file  Tobacco Use   Smoking status: Every Day    Current packs/day: 1.00    Average packs/day: 1 pack/day for 12.0 years (12.0 ttl pk-yrs)    Types: Cigarettes   Smokeless tobacco: Current    Types: Chew  Vaping Use   Vaping status: Never Used  Substance and Sexual Activity   Alcohol use: Yes    Alcohol/week: 20.0 standard drinks of alcohol    Types: 20 Cans of beer per week   Drug use: Not Currently    Types: Cocaine    Comment: last use couple of weeks ago when he had 2 lines,  frequency once in a blue moon   Sexual activity: Not Currently  Other Topics Concern   Not on file  Social History Narrative   Not on file   Social Drivers of Health   Financial Resource Strain: Low Risk  (08/03/2023)   Received from [facility]   Overall Financial Resource Strain (CARDIA)    Difficulty of Paying Living Expenses: Not hard at all  Food Insecurity: No Food Insecurity (08/03/2023)   Received from [facility]   Hunger Vital Sign    Within the past 12 months, you worried that your food would run out before you got the money to buy more.: Never true    Within the past 12 months, the food you bought just didn't last and you didn't have money to get more.: Never true  Transportation Needs: Patient Unable To Answer (08/03/2023)   Received from [facility] - Transportation    Lack of Transportation (Medical): Patient unable to answer    Lack of Transportation (Non-Medical): Patient unable to answer  Physical Activity: Insufficiently Active (08/03/2023)   Received from [facility]   Exercise Vital Sign    On average, how many days per week do you engage in moderate to strenuous exercise  (like a brisk walk)?: 2 days    On average, how many minutes do you engage in exercise at this level?: 60 min  Stress: No Stress Concern Present (08/03/2023)   Received from [facility] - Occupational Stress Questionnaire    Feeling of Stress : Not at all  Social Connections: Socially Integrated (08/03/2023)   Received from [facility]   Social Network    How would you rate your social network (family, work, friends)?: Good participation with social networks  Intimate Partner Violence: Not At Risk (08/03/2023)   Received from [facility]   HITS    Over the last 12 months how often did your partner physically hurt you?: Never    Over the last 12 months how often did your partner insult you or talk down to you?: Never    Over the last 12 months how often did your partner threaten you with physical harm?: Never  Over the last 12 months how often did your partner scream or curse at you?: Never   Family History: No family history on file.  Review of Systems: Constitutional: Doesn't report fevers, chills or abnormal weight loss Eyes: Doesn't report blurriness of vision Ears, nose, mouth, throat, and face: Doesn't report sore throat Respiratory: Doesn't report cough, dyspnea or wheezes Cardiovascular: Doesn't report palpitation, chest discomfort  Gastrointestinal:  Doesn't report nausea, constipation, diarrhea GU: Doesn't report incontinence Skin: Doesn't report skin rashes Neurological: Per HPI Musculoskeletal: Doesn't report joint pain Behavioral/Psych: Doesn't report anxiety  Physical Exam: Vitals:   12/04/23 1430  BP: 120/79  Pulse: 64  Resp: 20  Temp: 97.7 F (36.5 C)  SpO2: 97%   KPS: 80. General: Alert, cooperative, pleasant, in no acute distress Head: Normal EENT: No conjunctival injection or scleral icterus.  Lungs: Resp effort normal Cardiac: Regular rate Abdomen: Non-distended abdomen Skin: No rashes cyanosis or  petechiae. Extremities: No clubbing or edema  Neurologic Exam: Mental Status: Awake, alert, attentive to examiner. Oriented to self and environment. Language is fluent with intact comprehension.  Cranial Nerves: Visual acuity is grossly normal. Left homonymous hemianopia. Extra-ocular movements intact. No ptosis. Face is symmetric Motor: Tone and bulk are normal. Power is full in both arms and legs. Reflexes are symmetric, no pathologic reflexes present.  Sensory: Intact to light touch Gait: Sensory dystaxia   Labs: I have reviewed the data as listed    Component Value Date/Time   NA 137 11/10/2022 0610   K 4.4 11/10/2022 0610   CL 100 11/10/2022 0610   CO2 28 11/10/2022 0610   GLUCOSE 104 (H) 11/10/2022 0610   BUN 14 11/10/2022 0610   CREATININE 0.81 11/10/2022 0610   CREATININE 0.95 05/17/2021 1021   CALCIUM  9.6 11/10/2022 0610   PROT 7.2 11/08/2022 0803   ALBUMIN  3.9 11/08/2022 0803   AST 72 (H) 11/08/2022 0803   AST 37 05/17/2021 1021   ALT 102 (H) 11/08/2022 0803   ALT 48 (H) 05/17/2021 1021   ALKPHOS 66 11/08/2022 0803   BILITOT 0.7 11/08/2022 0803   BILITOT 1.1 05/17/2021 1021   GFRNONAA >60 11/10/2022 0610   GFRNONAA >60 05/17/2021 1021   GFRAA >60 12/05/2019 1206   Lab Results  Component Value Date   WBC 6.0 11/10/2022   NEUTROABS 2.8 11/08/2022   HGB 15.4 11/10/2022   HCT 44.6 11/10/2022   MCV 96.3 11/10/2022   PLT 346 11/10/2022   Imaging:  [facility] Clinician Interpretation: I have personally reviewed the CNS images as listed.  My interpretation, in the context of the patient's clinical presentation, is stable disease  MR BRAIN W WO CONTRAST Result Date: 11/30/2023 CLINICAL DATA:  Brain/CNS neoplasm, assess treatment response EXAM: MRI HEAD WITHOUT AND WITH CONTRAST TECHNIQUE: Multiplanar, multiecho pulse sequences of the brain and surrounding structures were obtained without and with intravenous contrast. CONTRAST:  6mL GADAVIST  GADOBUTROL  1 MMOL/ML IV SOLN  COMPARISON:  MRI head May 26, 2023. FINDINGS: Brain: Unchanged appearance of the right occipital resection cavity with similar surrounding T2/FLAIR hyperintensity. No masslike enhancement. Similar sequela of prior hemorrhage including hemosiderin deposition. No evidence of acute infarct, acute hemorrhage, mass lesion or midline shift. No hydrocephalus. Vascular: Major arterial flow voids are maintained at the skull base. Skull and upper cervical spine: Prior right occipital craniotomy. Otherwise, normal marrow signal. Sinuses/Orbits: Clear sinuses.  No acute orbital findings. Other: No mastoid effusions. IMPRESSION: Unchanged appearance of the right occipital resection cavity without evidence of progressive tumor. Electronically  Signed   By: Gilmore GORMAN Molt M.D.   On: 11/30/2023 13:56    Assessment/Plan 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HCC) [C71.4]  Stephen Beard is clinically stable today.  MRI brain again demonstrates no new or concerning findings.  We again recommended continued imaging surveillance only at this time, he is agreeable with this plan.   For seizures, which remain sporadic/rare, we recommended continuing Lamictal  250/250.   We ask that Stephen Beard return to clinic in 9 months following next brain MRI, or sooner as needed.    All questions were answered. The patient knows to call the clinic with any problems, questions or concerns. No barriers to learning were detected.  I have spent a total of 40 minutes of face-to-face and non-face-to-face time, excluding clinical staff time, preparing to see patient, ordering tests and/or medications, counseling the patient, and independently interpreting results and communicating results to the patient/family/caregiver    Allisha Harter K Shaquelle Hernon, MD Medical Director of Neuro-Oncology [facility] 12/04/23 2:33 PM

## 2023-12-04 NOTE — Telephone Encounter (Signed)
 Scheduled appointment per 9/22 los. Called and left a VM with appointment details for the patient.

## 2023-12-05 ENCOUNTER — Other Ambulatory Visit: Payer: Self-pay

## 2024-01-05 ENCOUNTER — Telehealth: Payer: Self-pay

## 2024-01-05 NOTE — Telephone Encounter (Signed)
 Patient called and requested to speak with Dr Buckley in person has a card he wishes to give to him. Patient called with times and note sent to his nurse so she is aware. Arland Legions BSN RN

## 2024-01-24 ENCOUNTER — Other Ambulatory Visit: Payer: Self-pay | Admitting: Internal Medicine

## 2024-01-24 DIAGNOSIS — R569 Unspecified convulsions: Secondary | ICD-10-CM

## 2024-01-25 ENCOUNTER — Encounter: Payer: Self-pay | Admitting: Internal Medicine

## 2024-03-11 ENCOUNTER — Other Ambulatory Visit: Payer: Self-pay

## 2024-08-26 ENCOUNTER — Ambulatory Visit: Admitting: Internal Medicine
